# Patient Record
Sex: Female | Born: 1945 | Race: White | Hispanic: No | Marital: Married | Smoking: Never smoker
Health system: Southern US, Community
[De-identification: ages and names within clinical notes are randomized; demographics above are authoritative.]

## PROBLEM LIST (undated history)

## (undated) DIAGNOSIS — K219 Gastro-esophageal reflux disease without esophagitis: Secondary | ICD-10-CM

## (undated) DIAGNOSIS — E785 Hyperlipidemia, unspecified: Secondary | ICD-10-CM

## (undated) DIAGNOSIS — K222 Esophageal obstruction: Secondary | ICD-10-CM

## (undated) DIAGNOSIS — R0600 Dyspnea, unspecified: Secondary | ICD-10-CM

## (undated) DIAGNOSIS — K429 Umbilical hernia without obstruction or gangrene: Secondary | ICD-10-CM

## (undated) DIAGNOSIS — N189 Chronic kidney disease, unspecified: Secondary | ICD-10-CM

## (undated) DIAGNOSIS — E039 Hypothyroidism, unspecified: Secondary | ICD-10-CM

## (undated) DIAGNOSIS — H269 Unspecified cataract: Secondary | ICD-10-CM

## (undated) DIAGNOSIS — I10 Essential (primary) hypertension: Secondary | ICD-10-CM

## (undated) DIAGNOSIS — K5790 Diverticulosis of intestine, part unspecified, without perforation or abscess without bleeding: Secondary | ICD-10-CM

## (undated) DIAGNOSIS — J189 Pneumonia, unspecified organism: Secondary | ICD-10-CM

## (undated) DIAGNOSIS — I7300 Raynaud's syndrome without gangrene: Secondary | ICD-10-CM

## (undated) DIAGNOSIS — I272 Pulmonary hypertension, unspecified: Secondary | ICD-10-CM

## (undated) DIAGNOSIS — I499 Cardiac arrhythmia, unspecified: Secondary | ICD-10-CM

## (undated) DIAGNOSIS — M1990 Unspecified osteoarthritis, unspecified site: Secondary | ICD-10-CM

## (undated) DIAGNOSIS — I4891 Unspecified atrial fibrillation: Secondary | ICD-10-CM

## (undated) DIAGNOSIS — D649 Anemia, unspecified: Secondary | ICD-10-CM

## (undated) DIAGNOSIS — R32 Unspecified urinary incontinence: Secondary | ICD-10-CM

## (undated) DIAGNOSIS — G4730 Sleep apnea, unspecified: Secondary | ICD-10-CM

## (undated) DIAGNOSIS — H8110 Benign paroxysmal vertigo, unspecified ear: Secondary | ICD-10-CM

## (undated) DIAGNOSIS — T4145XA Adverse effect of unspecified anesthetic, initial encounter: Secondary | ICD-10-CM

## (undated) DIAGNOSIS — M8580 Other specified disorders of bone density and structure, unspecified site: Secondary | ICD-10-CM

## (undated) DIAGNOSIS — I509 Heart failure, unspecified: Secondary | ICD-10-CM

## (undated) DIAGNOSIS — I519 Heart disease, unspecified: Secondary | ICD-10-CM

## (undated) HISTORY — DX: Unspecified cataract: H26.9

## (undated) HISTORY — PX: CATARACT EXTRACTION, BILATERAL: SHX1313

## (undated) HISTORY — DX: Essential (primary) hypertension: I10

## (undated) HISTORY — DX: Gastro-esophageal reflux disease without esophagitis: K21.9

## (undated) HISTORY — PX: EYE SURGERY: SHX253

## (undated) HISTORY — DX: Hypothyroidism, unspecified: E03.9

## (undated) HISTORY — DX: Diverticulosis of intestine, part unspecified, without perforation or abscess without bleeding: K57.90

## (undated) HISTORY — DX: Esophageal obstruction: K22.2

## (undated) HISTORY — PX: JOINT REPLACEMENT: SHX530

## (undated) HISTORY — DX: Benign paroxysmal vertigo, unspecified ear: H81.10

## (undated) HISTORY — PX: CHOLECYSTECTOMY: SHX55

## (undated) HISTORY — DX: Unspecified urinary incontinence: R32

## (undated) HISTORY — DX: Hyperlipidemia, unspecified: E78.5

## (undated) HISTORY — DX: Other specified disorders of bone density and structure, unspecified site: M85.80

## (undated) HISTORY — DX: Heart disease, unspecified: I51.9

## (undated) HISTORY — DX: Raynaud's syndrome without gangrene: I73.00

---

## 1978-08-03 HISTORY — PX: TUBAL LIGATION: SHX77

## 2004-08-03 HISTORY — PX: NASAL FRACTURE SURGERY: SHX718

## 2007-01-20 LAB — CONVERTED CEMR LAB
ALT: 16 units/L
AST: 15 units/L
Alkaline Phosphatase: 62 units/L
BUN: 19 mg/dL
CO2: 24 meq/L
Chloride: 100 meq/L
Creatinine, Ser: 1.07 mg/dL
Glucose, Bld: 87 mg/dL
Potassium: 4 meq/L
Sodium: 137 meq/L
Total Bilirubin: 0.4 mg/dL

## 2008-04-10 ENCOUNTER — Ambulatory Visit (HOSPITAL_COMMUNITY): Admission: RE | Admit: 2008-04-10 | Discharge: 2008-04-10 | Payer: Self-pay | Admitting: Internal Medicine

## 2008-05-03 ENCOUNTER — Ambulatory Visit: Payer: Self-pay | Admitting: Internal Medicine

## 2008-05-22 ENCOUNTER — Encounter: Payer: Self-pay | Admitting: Internal Medicine

## 2008-05-22 ENCOUNTER — Ambulatory Visit: Payer: Self-pay | Admitting: Internal Medicine

## 2008-05-25 ENCOUNTER — Encounter: Payer: Self-pay | Admitting: Internal Medicine

## 2009-02-11 LAB — CONVERTED CEMR LAB
ALT: 10 units/L
Cholesterol: 211 mg/dL
HDL: 64 mg/dL
LDL Cholesterol: 110 mg/dL
TSH: 3.11 microintl units/mL
Triglyceride fasting, serum: 185 mg/dL

## 2009-02-13 LAB — CONVERTED CEMR LAB
BUN: 18 mg/dL
CO2: 24 meq/L
Calcium: 9.2 mg/dL
Chloride: 105 meq/L
Creatinine, Ser: 1.2 mg/dL
Glucose, Bld: 90 mg/dL
Potassium: 4.2 meq/L
Sodium: 139 meq/L

## 2009-07-01 ENCOUNTER — Ambulatory Visit: Payer: Self-pay | Admitting: Internal Medicine

## 2009-07-01 DIAGNOSIS — K219 Gastro-esophageal reflux disease without esophagitis: Secondary | ICD-10-CM | POA: Insufficient documentation

## 2009-07-01 DIAGNOSIS — R32 Unspecified urinary incontinence: Secondary | ICD-10-CM | POA: Insufficient documentation

## 2009-07-01 DIAGNOSIS — Z87448 Personal history of other diseases of urinary system: Secondary | ICD-10-CM | POA: Insufficient documentation

## 2009-07-01 DIAGNOSIS — Z8669 Personal history of other diseases of the nervous system and sense organs: Secondary | ICD-10-CM | POA: Insufficient documentation

## 2009-07-01 DIAGNOSIS — IMO0001 Reserved for inherently not codable concepts without codable children: Secondary | ICD-10-CM | POA: Insufficient documentation

## 2009-07-01 DIAGNOSIS — Z9189 Other specified personal risk factors, not elsewhere classified: Secondary | ICD-10-CM | POA: Insufficient documentation

## 2009-07-01 DIAGNOSIS — E785 Hyperlipidemia, unspecified: Secondary | ICD-10-CM | POA: Insufficient documentation

## 2009-07-01 DIAGNOSIS — Z860100 Personal history of colon polyps, unspecified: Secondary | ICD-10-CM | POA: Insufficient documentation

## 2009-07-01 DIAGNOSIS — E669 Obesity, unspecified: Secondary | ICD-10-CM | POA: Insufficient documentation

## 2009-07-01 LAB — CONVERTED CEMR LAB: Pap Smear: NORMAL

## 2009-07-01 LAB — HM PAP SMEAR: HM Pap smear: NORMAL

## 2009-10-03 ENCOUNTER — Encounter: Payer: Self-pay | Admitting: Internal Medicine

## 2010-01-13 ENCOUNTER — Ambulatory Visit: Payer: Self-pay | Admitting: Internal Medicine

## 2010-01-13 DIAGNOSIS — M1611 Unilateral primary osteoarthritis, right hip: Secondary | ICD-10-CM | POA: Insufficient documentation

## 2010-01-20 ENCOUNTER — Encounter: Payer: Self-pay | Admitting: Internal Medicine

## 2010-02-13 LAB — HM MAMMOGRAPHY

## 2010-02-21 ENCOUNTER — Encounter: Payer: Self-pay | Admitting: Internal Medicine

## 2010-03-28 ENCOUNTER — Encounter: Payer: Self-pay | Admitting: Internal Medicine

## 2010-05-13 ENCOUNTER — Encounter: Payer: Self-pay | Admitting: Internal Medicine

## 2010-09-02 NOTE — Consult Note (Signed)
Summary: [facility]  [facility]   Imported By: Phillis Knack 01/24/2010 08:01:49  _____________________________________________________________________  External Attachment:    Type:   Image     Comment:   External Document

## 2010-09-02 NOTE — Assessment & Plan Note (Signed)
Summary: 3-6 mos f/u #/cd   Vital Signs:  Patient profile:   65 year old female Height:      60.5 inches (153.67 cm) Weight:      165.8 pounds (75.36 kg) Temp:     98.0 degrees F (36.67 degrees C) oral Pulse rate:   88 / minute BP sitting:   140 / 88  (left arm) Cuff size:   regular  Vitals Entered By: Tomma Lightning (January 13, 2010 1:39 PM) CC: 6 month follow-up Is Patient Diabetic? No Pain Assessment Patient in pain? no        Primary Care Provider:  Rowe Clack MD  CC:  6 month follow-up.  History of Present Illness: here for 6 mo f/u  1) HTN- intol of multiple meds - lisinopril+diuretic, benicar, diovan, benicar, norvasc, atenolol - cuurently on no meds and feels well - no CP or dizziness off med - home BP log reviewed -  2) dyslipidemia - intol of many meds  - muscle cramps on simvastin, niaspan, welchol taking fish oil w/o problems - reports "good ratio" and does not feel need for meds  3) GERD - no active symptoms at this time -  no hx ulcers - takes OTC meds only as needed   4) CKD - [place] - but reports renal fx improved s no longer following there  5) c/o hip pain on right, esp witrh twisitng or prolonged walking - ?OA or residual myalgia from statin  Clinical Review Panels:  Lipid Management   Cholesterol:  211 (02/11/2009)   LDL (bad choesterol):  110 (02/11/2009)   HDL (good cholesterol):  64 (02/11/2009)   Triglycerides:  185 (02/11/2009)  Complete Metabolic Panel   Glucose:  90 (02/13/2009)   Sodium:  139 (02/13/2009)   Potassium:  4.2 (02/13/2009)   Chloride:  105 (02/13/2009)   CO2:  24 (02/13/2009)   BUN:  18 (02/13/2009)   Creatinine:  1.20 (02/13/2009)   Calcium:  9.2 (02/13/2009)   Total Bili:  0.4 (01/20/2007)   Alk Phos:  62 (01/20/2007)   SGPT (ALT):  10 (02/11/2009)   SGOT (AST):  15 (01/20/2007)   Current Medications (verified): 1)  Vitamin D 2000 Unit Tabs (Cholecalciferol) ....  Take 1 By Mouth Weekly 2)  Centrum Silver  Tabs (Multiple Vitamins-Minerals) .... Take 1 Po Qd 3)  Calcium 600 600 Mg Tabs (Calcium Carbonate) .... Take 1 Po Qd 4)  Estrace 1 Mg Tabs (Estradiol) .... Take 1 By Mouth Weekly 5)  Refresh 1 % Soln (Carboxymethylcellulose Sodium) .Marland Kitchen.. 1 Drop in Both Eyes Once Daily 6)  Fish Oil 1200 Mg Caps (Omega-3 Fatty Acids) .... Take 1 By Mouth Once Daily  Allergies (verified): 1)  ! Pcn  Past History:  Past Medical History: Colonic polyps, hx of Hypertension dyslipidemia  Review of Systems  The patient denies weight loss, chest pain, syncope, and abdominal pain.    Physical Exam  General:  overweight-appearing.  alert, well-developed, well-nourished, and cooperative to examination.    Lungs:  normal respiratory effort, no intercostal retractions or use of accessory muscles; normal breath sounds bilaterally - no crackles and no wheezes.    Heart:  normal rate, regular rhythm, no murmur, and no rub. BLE without edema. (bilaterally "fat ankles") normal DP pulses and normal cap refill in all 4 extremities    Msk:  right hip: full range of motion. Negative pain with logroll. No deep groin pain. nontender  over greater trochanter.   Neurologic:  alert & oriented X3 and cranial nerves II-XII symetrically intact.  strength normal in all extremities, sensation intact to light touch, and gait normal. speech fluent without dysarthria or aphasia; follows commands with good comprehension.    Impression & Recommendations:  Problem # 1:  HYPERTENSION (ICD-401.9)  BP today: 140/88 Prior BP: 130/82 (07/01/2009)  Labs Reviewed: K+: 4.2 (02/13/2009) Creat: : 1.20 (02/13/2009)   Chol: 211 (02/11/2009)   HDL: 64 (02/11/2009)   LDL: 110 (02/11/2009)   TG: 185 (02/11/2009)  monitor off meds at this time - multiple drug intol reviewed - now with normal renal fx - advised DASH diet, exercise and weight loss  Problem # 2:  DYSLIPIDEMIA (ICD-272.4)  labs reviewed  -  med intol reviewed - cont fish oil and diet/exercise/weight control at this time  Labs Reviewed: SGOT: 15 (01/20/2007)   SGPT: 10 (02/11/2009)   HDL:64 (02/11/2009)  LDL:110 (02/11/2009)  Chol:211 (02/11/2009)  Trig:185 (02/11/2009)  Problem # 3:  PAIN IN JOINT PELVIC REGION AND THIGH (ICD-719.45)  exam benign - check xray and refer to Pt cont tylenol as needed  Orders: T-Hip Comp Right Min 2 views (73510TC) Physical Therapy Referral (PT)  Discussed use of medications, application of heat or cold, and exercises.   Complete Medication List: 1)  Vitamin D 2000 Unit Tabs (Cholecalciferol) .... Take 1 by mouth weekly 2)  Centrum Silver Tabs (Multiple vitamins-minerals) .... Take 1 po qd 3)  Calcium 600 600 Mg Tabs (Calcium carbonate) .... Take 1 po qd 4)  Estrace 0.1 Mg/gm Crea (Estradiol) .... Apply weekly 5)  Refresh 1 % Soln (Carboxymethylcellulose sodium) .Marland Kitchen.. 1 drop in both eyes once daily 6)  Fish Oil 1200 Mg Caps (Omega-3 fatty acids) .... Take 1 by mouth once daily  Patient Instructions: 1)  it was good to see you today.  2)  xray right hip ordered today - your results will be posted on the phone tree for review in 48-72 hours from the time of test completion; call [phone] and enter your 9 digit MRN (listed above on this page, just below your name); if any changes need to be made or there are abnormal results, you will be contacted directly.  3)  we'll make referral to physical therapy for your right leg/thigh and hip pain. Our office will contact you regarding this appointment once made.  4)  refill on estrace as discussed - your prescriptions have been electronically submitted to your pharmacy. Please take as directed. Contact our office if you believe you're having problems with the medication(s).  5)  Please schedule a follow-up appointment in 6-12 months, sooner if problems.  Prescriptions: ESTRACE 0.1 MG/GM CREA (ESTRADIOL) apply weekly  #1 x 1   Entered and  Authorized by:   Rowe Clack MD   Signed by:   Rowe Clack MD on 01/13/2010   Method used:   Electronically to        [facility]  [phone]* (retail)       [place], [place]  [address]       Ph: [phone] or [phone]       Fax: [phone]   RxID:   [id number]

## 2010-09-02 NOTE — Miscellaneous (Signed)
Summary: Discharge for PT/[facility]  Discharge for PT/[facility]   Imported By: Phillis Knack 05/15/2010 13:20:12  _____________________________________________________________________  External Attachment:    Type:   Image     Comment:   External Document

## 2010-09-02 NOTE — Miscellaneous (Signed)
Summary: [facility]   Imported By: Bubba Hales 02/25/2010 09:27:40  _____________________________________________________________________  External Attachment:    Type:   Image     Comment:   External Document

## 2010-09-02 NOTE — Miscellaneous (Signed)
Summary: PT/[facility]  PT/[facility]   Imported By: Phillis Knack 04/02/2010 09:36:34  _____________________________________________________________________  External Attachment:    Type:   Image     Comment:   External Document

## 2010-09-02 NOTE — Therapy (Signed)
Summary: [facility] [place] By: Bubba Hales 10/09/2009 11:54:07  _____________________________________________________________________  External Attachment:    Type:   Image     Comment:   External Document

## 2010-09-17 ENCOUNTER — Encounter: Payer: Self-pay | Admitting: Internal Medicine

## 2010-09-17 ENCOUNTER — Ambulatory Visit (INDEPENDENT_AMBULATORY_CARE_PROVIDER_SITE_OTHER): Payer: Medicare Other | Admitting: Internal Medicine

## 2010-09-17 ENCOUNTER — Telehealth: Payer: Self-pay | Admitting: Internal Medicine

## 2010-09-17 DIAGNOSIS — H8110 Benign paroxysmal vertigo, unspecified ear: Secondary | ICD-10-CM

## 2010-09-17 DIAGNOSIS — Z136 Encounter for screening for cardiovascular disorders: Secondary | ICD-10-CM

## 2010-09-24 NOTE — Progress Notes (Signed)
Summary: OV TODAY - high bp, dizzy  Phone Note Call from Patient   Summary of Call: Pt c/o dizzyness when standing. No fever, sinus complaints, h/a vision or other problems. She takes no BP meds. C/o elevated BP today 178/110. MD informed and ok to add to schedule today. Advised to call office w/any changes.  Initial call taken by: Charlsie Quest, [place],  September 17, 2010 11:44 AM  Follow-up for Phone Call        ok - thx Follow-up by: Rowe Clack MD,  September 17, 2010 11:46 AM

## 2010-09-24 NOTE — Assessment & Plan Note (Signed)
Summary: ELEVATED BP/ DIZZY /NWS   Vital Signs:  Patient profile:   65 year old female Height:      60.5 inches (153.67 cm) Weight:      164.0 pounds (74.55 kg) BMI:     31.62 Temp:     97.6 degrees F (36.44 degrees C) oral Pulse rate:   92 / minute BP sitting:   158 / 96  (left arm) Cuff size:   regular  Vitals Entered By: Tomma Lightning RMA (September 17, 2010 3:00 PM) CC: Elevated BP Is Patient Diabetic? No Pain Assessment Patient in pain? no        Primary Care Provider:  Rowe Clack MD  CC:  Elevated BP.  History of Present Illness: here for uncontrolled htn and dizziness started as dizziness 5 days ago - transient symptoms  then return of vertigo this AM - feels off balance with head turn or leaning, esp to left side BP elev today - reviewed home log - but prev noirmal BP at home deneis change in meds -  no HA or tinnitus or ear pain no recent uri or fever or nasal congestion no hx prior vertigo  also reviewed chronic med issues: 1) HTN- intol of multiple meds - lisinopril+diuretic, benicar, diovan, benicar, norvasc, atenolol - cuurently on no meds and feels well - no CP or dizziness off med - home BP log reviewed -  2) dyslipidemia - intol of many meds  - muscle cramps on simvastin, niaspan, welchol taking fish oil w/o problems - reports "good ratio" and does not feel need for meds  3) GERD - no active symptoms at this time -  no hx ulcers - takes OTC meds only as needed   4) CKD - prev followed [place] renal dr. Dim[place] - but reports renal fx improved so no longer following there   Current Medications (verified): 1)  Vitamin D 2000 Unit Tabs (Cholecalciferol) .... Take 1 By Mouth Three Times A Week 2)  Centrum Silver  Tabs (Multiple Vitamins-Minerals) .... Take 1 Po Qd 3)  Calcium 600 600 Mg Tabs (Calcium Carbonate) .... Take 1 Po Qd 4)  Estrace 0.1 Mg/gm Crea (Estradiol) .... Apply Weekly 5)  Refresh 1 % Soln (Carboxymethylcellulose Sodium)  .Marland Kitchen.. 1 Drop in Both Eyes Once Daily 6)  Fish Oil 1200 Mg Caps (Omega-3 Fatty Acids) .... Take 2 By Mouth Once Daily  Allergies (verified): 1)  ! Pcn  Past History:  Past Medical History: Colonic polyps, hx of Hypertension dyslipidemia  MD roster: ENT - [facility]  Review of Systems  The patient denies fever, vision loss, chest pain, peripheral edema, headaches, and abdominal pain.    Physical Exam  General:  overweight-appearing.  alert, well-developed, well-nourished, and cooperative to examination.    Eyes:  vision grossly intact; pupils equal, round and reactive to light.  conjunctiva and lids normal.   no nystagmus Ears:  L TM obscured by cerumen - R TM clear w/o effusion - B hearing aides Lungs:  normal respiratory effort, no intercostal retractions or use of accessory muscles; normal breath sounds bilaterally - no crackles and no wheezes.    Heart:  normal rate, regular rhythm, no murmur, and no rub. BLE without edema. (bilaterally "fat ankles") normal DP pulses and normal cap refill in all 4 extremities    Neurologic:  alert & oriented X3 and cranial nerves II-XII symetrically intact.  strength normal in all extremities, sensation intact to light touch, and gait normal.  speech fluent without dysarthria or aphasia; follows commands with good comprehension.    Impression & Recommendations:  Problem # 1:  BENIGN POSITIONAL VERTIGO (ICD-386.11)  exam benign - reassurance provided -  erx for meclizine done rec to f/u ENT if not improved to clean left ear (pt declined cerumen removal by our office today) Her updated medication list for this problem includes:    Meclizine Hcl 25 Mg Tabs (Meclizine hcl) .Marland Kitchen... 1/2-1 tab by mouth every 4 hours as needed for dizzy symptoms  Demonstrated maneuvers to self-treat vertigo. Patient to call to be seen if no improvement in 10-14 days, sooner if worse.   Orders: Prescription Created Electronically [phone])  Problem # 2:  HYPERTENSION  (ICD-401.9)  Orders: EKG w/ Interpretation (93000) pt attributes inc BP to anxiety about vertigo - home BP log reviewed - usually SBP 110-130 will monitor off meds at this time - multiple drug intol reviewed again-  normal renal fx - advised DASH diet, exercise and weight loss to call if home BP uncontrolled  BP today: 158/96 Prior BP: 140/88 (01/13/2010) Prior BP: 130/82 (07/01/2009)  Labs Reviewed: K+: 4.2 (02/13/2009) Creat: : 1.20 (02/13/2009)   Chol: 211 (02/11/2009)   HDL: 64 (02/11/2009)   LDL: 110 (02/11/2009)   TG: 185 (02/11/2009)  Complete Medication List: 1)  Vitamin D 2000 Unit Tabs (Cholecalciferol) .... Take 1 by mouth three times a week 2)  Centrum Silver Tabs (Multiple vitamins-minerals) .... Take 1 po qd 3)  Calcium 600 600 Mg Tabs (Calcium carbonate) .... Take 1 po qd 4)  Estrace 0.1 Mg/gm Crea (Estradiol) .... Apply weekly 5)  Refresh 1 % Soln (Carboxymethylcellulose sodium) .Marland Kitchen.. 1 drop in both eyes once daily 6)  Fish Oil 1200 Mg Caps (Omega-3 fatty acids) .... Take 2 by mouth once daily 7)  Meclizine Hcl 25 Mg Tabs (Meclizine hcl) .... 1/2-1 tab by mouth every 4 hours as needed for dizzy symptoms  Patient Instructions: 1)  it was good to see you today. 2)  no evidence for stroke or heart attck on evaluation today 3)  your dizziness is due to vertigio - use meclizine to help these symptoms - your prescription has been electronically submitted to your pharmacy. Please take as directed. Contact our office if you believe you're having problems with the medication(s).  4)  If not improved, contact dr. Ernesto Rutherford to clean your left ear 5)  also consider vestibular rehab (physical therapy) if symptoms not improved - call as needed for referral 6)  Check your Blood Pressure regularly. If it is above: 140/85 you should make an appointment. 7)  Please schedule a follow-up appointment in 3-6 months to monitor your bloos pressure, sooner if problems.   Prescriptions: MECLIZINE HCL 25 MG TABS (MECLIZINE HCL) 1/2-1 tab by mouth every 4 hours as needed for dizzy symptoms  #30 x 0   Entered and Authorized by:   Rowe Clack MD   Signed by:   Rowe Clack MD on 09/17/2010   Method used:   Electronically to        [facility]  [phone]* (retail)       [place], [place]  [address]       Ph: [phone] or [phone]       Fax: [phone]   RxID:   [id number]    Orders Added: 1)  EKG w/ Interpretation [93000] 2)  Est. Patient Level IV [id number] 3)  Prescription  Created Electronically [phone]

## 2010-12-12 ENCOUNTER — Encounter: Payer: Self-pay | Admitting: Internal Medicine

## 2010-12-17 ENCOUNTER — Ambulatory Visit (INDEPENDENT_AMBULATORY_CARE_PROVIDER_SITE_OTHER): Payer: Medicare Other | Admitting: Internal Medicine

## 2010-12-17 ENCOUNTER — Encounter: Payer: Self-pay | Admitting: Internal Medicine

## 2010-12-17 DIAGNOSIS — M25559 Pain in unspecified hip: Secondary | ICD-10-CM

## 2010-12-17 DIAGNOSIS — E785 Hyperlipidemia, unspecified: Secondary | ICD-10-CM

## 2010-12-17 DIAGNOSIS — I10 Essential (primary) hypertension: Secondary | ICD-10-CM

## 2010-12-17 MED ORDER — ESTRADIOL 0.1 MG/GM VA CREA: 2.0000 g | TOPICAL_CREAM | VAGINAL | Status: DC

## 2010-12-17 NOTE — Progress Notes (Signed)
  Subjective:    Patient ID: [name], female    DOB: [birth year], 65 y.o.   MRN: [id number]  HPI   here for follow up - reviewed chronic medical issues today:  HTN, ?white coat - intol of multiple meds - lisinopril+diuretic, benicar, diovan, benicar, norvasc, atenolol - currently on no meds and feels well - no CP or dizziness off med - home BP log reviewed - range SBP 110-140s - declines medicine tx  dyslipidemia - intol of many meds  - muscle cramps on simvastin, niaspan, welchol - still feels thighs are weak due to prior tx - improved with PT. taking fish oil w/o problems - reports "good ratio" and does not feel need for other meds  GERD - no active symptoms at this time -  no hx ulcers - takes OTC meds only as needed   CKD - prev [place] renal [place] Dim[place] - but reports renal fx improved so no longer following there  Past Medical History  Diagnosis Date  . Obesity, unspecified   . URINARY INCONTINENCE   . SYNCOPE, HX OF   . BENIGN POSITIONAL VERTIGO   . GERD   . DYSLIPIDEMIA   . HYPERTENSION       Review of Systems  Constitutional: Negative for unexpected weight change.  Respiratory: Negative for shortness of breath.   Cardiovascular: Negative for chest pain, palpitations and leg swelling.  Neurological: Negative for headaches.       Objective:   Physical Exam BP 180/118  Pulse 109  Temp(Src) 98 F (36.7 C) (Oral)  Resp 14  Wt 166 lb (75.297 kg)  SpO2 95% Physical Exam  Constitutional: She is overweight; oriented to person, place, and time. She appears well-developed and well-nourished. No distress. Neck: Normal range of motion. Neck supple. No JVD present. No thyromegaly present.  Cardiovascular: Normal rate, regular rhythm and normal heart sounds.  No murmur heard. No BLE edema. Pulmonary/Chest: Effort normal and breath sounds normal. No respiratory distress. She has no wheezes.  Psychiatric: She has a normal mood and affect. Her  behavior is normal. Judgment and thought content normal.   Lab Results  Component Value Date   CHOL 211 02/11/2009   HDL 64 02/11/2009   ALT 10 02/11/2009   AST 15 01/20/2007   NA 139 02/13/2009   K 4.2 02/13/2009   CL 105 02/13/2009   CREATININE 1.20 02/13/2009   BUN 18 02/13/2009   CO2 24 02/13/2009   TSH 3.110 02/11/2009        Assessment & Plan:  See problem list. Medications and labs reviewed today. Time spent with pt  today 25 minutes, greater than 50% time spent counseling patient on hypertension, cholesterol and medication review. Also review of prior records

## 2010-12-17 NOTE — Patient Instructions (Addendum)
It was good to see you today. Medications reviewed, no changes at this time. Refill on medication(s) as discussed today. Continue to monitor your home blood pressure and call us if this is over 140s/90s Continue to work on your exercises and let us if repeat course of physical therapy is needed Please schedule followup in 12 months, call sooner if problems.

## 2010-12-18 NOTE — Assessment & Plan Note (Signed)
Severe "white coat" per pt - home BP under improved control without meds Prev tx with intol of multiple meds - lisinopril+diuretic, benicar, diovan, benicar, norvasc, atenolol - currently on no meds and feels well - no CP, edema or dizziness off med - home BP log reviewed - Pt declines any rx tx due to these factors - advised to call if home BP readings consistently >140s/90s and pt agrees BP Readings from Last 3 Encounters:  12/17/10 180/118  09/17/10 158/96  01/13/10 140/88

## 2010-12-18 NOTE — Assessment & Plan Note (Signed)
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

## 2010-12-18 NOTE — Assessment & Plan Note (Signed)
pt intol of many meds  - muscle cramps on prior tx simvastin, niaspan, welchol reviewed taking fish oil without problems, remains active - reports "good ratio" and does not feel need for med tx

## 2011-03-02 ENCOUNTER — Ambulatory Visit (INDEPENDENT_AMBULATORY_CARE_PROVIDER_SITE_OTHER): Payer: Medicare Other | Admitting: Internal Medicine

## 2011-03-02 ENCOUNTER — Encounter: Payer: Self-pay | Admitting: Internal Medicine

## 2011-03-02 VITALS — BP 132/98 | HR 82 | Temp 98.6°F | Ht 60.5 in

## 2011-03-02 DIAGNOSIS — J0100 Acute maxillary sinusitis, unspecified: Secondary | ICD-10-CM

## 2011-03-02 MED ORDER — FLUCONAZOLE 150 MG PO TABS: 150.0000 mg | ORAL_TABLET | Freq: Once | ORAL | Status: DC

## 2011-03-02 MED ORDER — CEPHALEXIN 250 MG PO CAPS: 250.0000 mg | ORAL_CAPSULE | Freq: Two times a day (BID) | ORAL | Status: AC

## 2011-03-02 NOTE — Progress Notes (Signed)
  Subjective:     Tiffany [name] is a 65 y.o. female who presents for evaluation of sinus pain. Symptoms include: clear rhinorrhea, cough, facial pain, mouth breathing, sinus pressure and sore throat. Onset of symptoms was 1 week ago. Symptoms have been unchanged since that time. Past history is significant for recurrent sinusitus and tonsillitis. Patient is a non-smoker.  The following portions of the patient's history were reviewed and updated as appropriate: allergies, current medications, past family history, past medical history, past social history, past surgical history and problem list.  Review of Systems Pertinent items are noted in HPI.  No fever, no shortness of breath and no syncope.  Objective:    BP 132/98  Pulse 82  Temp(Src) 98.6 F (37 C) (Oral)  Ht 5' 0.5" (1.537 m) General appearance: alert, cooperative and mild distress Eyes: conjunctivae/corneas clear. PERRL, EOM's intact. Fundi benign. Ears: normal TM's and external ear canals both ears Nose: no discharge, sinus tenderness right, no crusting or bleeding points Throat: abnormal findings: moderate oropharyngeal erythema and no exudate Neck: mild anterior cervical adenopathy, supple, symmetrical, trachea midline and thyroid not enlarged, symmetric, no tenderness/mass/nodules Lungs: clear to auscultation bilaterally Heart: regular rate and rhythm, S1, S2 normal, no murmur, click, rub or gallop    Assessment:    Acute bacterial sinusitis.    Plan:    Nasal saline sprays. Ceftin per medication orders. follow up ENT as needed

## 2011-03-02 NOTE — Patient Instructions (Signed)
It was good to see you today. Ceftin for sinus infection and Diflucan as discussed - Your prescription(s) have been submitted to your pharmacy. Please take as directed and contact our office if you believe you are having problem(s) with the medication(s). Continue Mucinex and nose sprays as reviewed - follow up [facility] as needed

## 2011-03-19 ENCOUNTER — Other Ambulatory Visit: Payer: Self-pay | Admitting: Internal Medicine

## 2011-05-06 ENCOUNTER — Encounter: Payer: Self-pay | Admitting: Internal Medicine

## 2011-05-06 ENCOUNTER — Ambulatory Visit (INDEPENDENT_AMBULATORY_CARE_PROVIDER_SITE_OTHER): Payer: Medicare Other | Admitting: Internal Medicine

## 2011-05-06 DIAGNOSIS — M5431 Sciatica, right side: Secondary | ICD-10-CM

## 2011-05-06 DIAGNOSIS — M5416 Radiculopathy, lumbar region: Secondary | ICD-10-CM

## 2011-05-06 DIAGNOSIS — IMO0002 Reserved for concepts with insufficient information to code with codable children: Secondary | ICD-10-CM

## 2011-05-06 DIAGNOSIS — M25559 Pain in unspecified hip: Secondary | ICD-10-CM

## 2011-05-06 DIAGNOSIS — M5430 Sciatica, unspecified side: Secondary | ICD-10-CM

## 2011-05-06 NOTE — Assessment & Plan Note (Signed)
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

## 2011-05-06 NOTE — Progress Notes (Signed)
  Subjective:    Patient ID: Tiffany [name], female    DOB: 1946/05/01, 65 y.o.   MRN: [id number]  HPI complains of R low back pain, R hip and groin Hx same since 2010, known sciatica - Progressive symptoms - weakness as well as ache symptoms worse sitting and bending forward - improved standing at rest, exac with activity No fever, weight loss or urinary issues  Also reviewed chronic medical issues today:  HTN, ?white coat - intol of multiple meds - lisinopril+diuretic, benicar, diovan, benicar, norvasc, atenolol - currently on no meds and feels well - no CP or dizziness off med - home BP log reviewed - range SBP 110-140s - declines medicine tx  dyslipidemia - intol of many meds  - muscle cramps on simvastin, niaspan, welchol - still feels thighs are weak due to prior tx - improved with PT. taking fish oil w/o problems - reports "good ratio" and does not feel need for other meds  GERD - no active symptoms at this time -  no hx ulcers - takes OTC meds only as needed   CKD - [place] - but reports renal fx improved so no longer following there  Past Medical History  Diagnosis Date  . Obesity, unspecified   . URINARY INCONTINENCE   . SYNCOPE, HX OF   . BENIGN POSITIONAL VERTIGO   . GERD   . DYSLIPIDEMIA   . HYPERTENSION      Review of Systems  Constitutional: Negative for unexpected weight change.  Respiratory: Negative for shortness of breath.   Cardiovascular: Negative for chest pain, palpitations and leg swelling.  Neurological: Negative for headaches.       Objective:   Physical Exam  BP 138/92  Pulse 101  Temp(Src) 99 F (37.2 C) (Oral)  Ht 5' (1.524 m)  Wt 165 lb 1.9 oz (74.898 kg)  BMI 32.25 kg/m2  SpO2 95%   Constitutional: She is overweight; appears well-developed and well-nourished. No distress. Neck: Normal range of motion. Neck supple. No JVD present. No thyromegaly present.  Cardiovascular: Normal rate, regular  rhythm and normal heart sounds.  No murmur heard. No BLE edema. Pulmonary/Chest: Effort normal and breath sounds normal. No respiratory distress. She has no wheezes.  Mskel: Back: full range of motion of thoracic and lumbar spine. Min paraspinal lumbar tenderness on R to palpation. Positive ipsilateral straight leg raise. DTR's are symmetrically intact. Sensation intact in all dermatomes of the lower extremities. Full strength to manual muscle testing - able to heel toe walk without difficulty but ambulates with antalgic gait.  Lab Results  Component Value Date   CHOL 211 02/11/2009   HDL 64 02/11/2009   ALT 10 02/11/2009   AST 15 01/20/2007   NA 139 02/13/2009   K 4.2 02/13/2009   CL 105 02/13/2009   CREATININE 1.20 02/13/2009   BUN 18 02/13/2009   CO2 24 02/13/2009   TSH 3.110 02/11/2009        Assessment & Plan:  See problem list. Medications and labs reviewed today.

## 2011-05-06 NOTE — Patient Instructions (Signed)
It was good to see you today. we'll make referral for MRI. Our office will contact you regarding appointment(s) once made. Your results will be called to you after review (48-72hours after test completion). If any therapy or referral to back specialist are needed, you will be notified at that time.  Sciatica with Rehab   The sciatic nerve runs from the back down the leg and is responsible for sensation and control of the muscles in the back (posterior) side of the thigh, lower leg, and foot. Sciatica is a condition that is characterized by inflammation of this nerve.     SYMPTOMS  Signs of nerve damage, including numbness and/or weakness along the posterior side of the lower extremity.  Pain in the back of the thigh that may also travel down the leg.  Pain that worsens when sitting for long periods of time.  Occasionally, pain in the back or buttock.   CAUSES Inflammation of the sciatic nerve is the cause of sciatica. The inflammation is due to something irritating the nerve. Common sources of irritation include:  Sitting for long periods of time.  Direct trauma to the nerve.  Arthritis of the spine.  Herniated or ruptured disk.  Slipping of the vertebrae (spondylolithesis )  Pressure from soft tissues, such as muscles or ligament-like tissue (fascia).   RISK INCREASES WITH    Sports that place pressure or stress on the spine (football or weightlifting).  Poor strength and flexibility.  Failure to warm-up properly before activity.  Family history of low back pain or disk disorders.  Previous back injury or surgery.  Poor body mechanics, especially when lifting, or poor posture.   PREVENTIVE MEASURES    Warm up and stretch properly before activity.  Maintain physical fitness: l Strength, flexibility, and endurance.  Cardiovascular fitness.  Learn and use proper technique, especially with posture and lifting. When possible, have coach correct improper  technique.  Avoid activities that place stress on the spine.   EXPECTED OUTCOME If treated properly, then sciatica usually resolves within 6 weeks. However, occasionally surgery is necessary.     POSSIBLE COMPLICATIONS    Permanent nerve damage, including pain, numbness, tingle, or weakness.  Chronic back pain.  Risks of surgery: infection, bleeding, nerve damage, or damage to surrounding tissues.   GENERAL TREATMENT CONSIDERATIONS   Treatment initially involves resting from any activities that aggravate your symptoms. The use of ice and medication may help reduce pain and inflammation. The use of strengthening and stretching exercises may help reduce pain with activity. These exercises may be performed at home or with referral to a therapist. A therapist may recommend further treatments, such as transcutaneous electronic nerve stimulation (TENS) or ultrasound.  Your caregiver may recommend corticosteroid injections to help reduce inflammation of the sciatic nerve. If symptoms persist despite non-surgical (conservative) treatment, then surgery may be recommended.   MEDICATION:    If pain medication is necessary, then nonsteroidal anti-inflammatory medications, such as aspirin and ibuprofen, or other minor pain relievers, such as acetaminophen, are often recommended.    Do not take pain medication for 7 days before surgery.    Prescription pain relievers may be given if deemed necessary by your caregiver. Use only as directed and only as much as you need.  Ointments applied to the skin may be helpful.  Corticosteroid injections may be given by your caregiver. These injections should be reserved for the most serious cases, because they may only be given a certain number of times.  HEAT AND COLD:    Cold treatment (icing) relieves pain and reduces inflammation. Cold treatment should be applied for 10 to 15 minutes every 2 to 3 hours for inflammation and pain and immediately after any  activity that aggravates your symptoms. Use ice packs or massage the area with a piece of ice (ice massage).  Heat treatment may be used prior to performing the stretching and strengthening activities prescribed by your caregiver, physical therapist, or athletic trainer. Use a heat pack or soak the injury in warm water.   SEEK MEDICAL TREATMENT IF:  Treatment seems to offer no benefit, or the condition worsens.  Any medications produce adverse side effects.   EXERCISES    RANGE OF MOTION AND STRETCHING EXERCISES - Sciatica Most people with sciatic will find that their symptoms worsen with either excessive bending forward (flexion) or arching at the low back (extension). The exercises which will help resolve your symptoms will focus on the opposite motion. Your physician, physical therapist or athletic trainer will help you determine which exercises will be most helpful to resolve your low back pain. Do not complete any exercises without first consulting with your clinician. Discontinue any exercises which worsen your symptoms until you speak to your clinician. If you have pain, numbness or tingling which travels down into your buttocks, leg or foot, the goal of the therapy is for these symptoms to move closer to your back and eventually resolve. Occasionally, these leg symptoms will get better, but your low back pain may worsen; this is typically an indication of progress in your rehabilitation. Be certain to be very alert to any changes in your symptoms and the activities in which you participated in the 24 hours prior to the change. Sharing this information with your clinician will allow him/her to most efficiently treat your condition.   These exercises may help you when beginning to rehabilitate your injury. Your symptoms may resolve with or without further involvement from your physician, physical therapist or athletic trainer. While completing these exercises, remember:    Restoring tissue  flexibility helps normal motion to return to the joints. This allows healthier, less painful movement and activity.  An effective stretch should be held for at least 30 seconds.  A stretch should never be painful. You should only feel a gentle lengthening or release in the stretched tissue.       FLEXION RANGE OF MOTION AND STRETCHING EXERCISES:   STRETCH - Flexion, Single Knee to Chest   Lie on a firm bed or floor with both legs extended in front of you.  Keeping one leg in contact with the floor, bring your opposite knee to your chest. Hold your leg in place by either grabbing behind your thigh or at your knee.  Pull until you feel a gentle stretch in your low back. Hold __________ seconds.  Slowly release your grasp and repeat the exercise with the opposite side. Repeat __________ times. Complete this exercise __________ times per day.       STRETCH - Flexion, Double Knee to Chest    Lie on a firm bed or floor with both legs extended in front of you.  Keeping one leg in contact with the floor, bring your opposite knee to your chest.    Tense your stomach muscles to support your back and then lift your other knee to your chest. Hold your legs in place by either grabbing behind your thighs or at your knees.  Pull both knees toward your chest  until you feel a gentle stretch in your low back. Hold __________ seconds.  Tense your stomach muscles and slowly return one leg at a time to the floor. Repeat __________ times. Complete this exercise __________ times per day.       STRETCH - Low Trunk Rotation   Lie on a firm bed or floor. Keeping your legs in front of you, bend your knees so they are both pointed toward the ceiling and your feet are flat on the floor.  Extend your arms out to the side. This will stabilize your upper body by keeping your shoulders in contact with the floor.  Gently and slowly drop both knees together to one side until you feel a gentle stretch in your  low back. Hold for __________ seconds.    Tense your stomach muscles to support your low back as you bring your knees back to the starting position.  Repeat the exercise to the other side. Repeat __________ times. Complete this exercise __________ times per day       EXTENSION RANGE OF MOTION AND FLEXIBILITY EXERCISES:   STRETCH - Extension, Prone on Elbows    Lie on your stomach on the floor, a bed will be too soft. Place your palms about shoulder width apart and at the height of your head.  Place your elbows under your shoulders. If this is too painful, stack pillows under your chest.  Allow your body to relax so that your hips drop lower and make contact more completely with the floor.  Hold this position for __________ seconds.  Slowly return to lying flat on the floor. Repeat __________ times. Complete this exercise __________ times per day.       RANGE OF MOTION - Extension, Prone Press Ups    Lie on your stomach on the floor, a bed will be too soft. Place your palms about shoulder width apart and at the height of your head.  Keeping your back as relaxed as possible, slowly straighten your elbows while keeping your hips on the floor. You may adjust the placement of your hands to maximize your comfort. As you gain motion, your hands will come more underneath your shoulders.  Hold this position __________ seconds.  Slowly return to lying flat on the floor. Repeat __________ times. Complete this exercise __________ times per day.       STRENGTHENING EXERCISES - Sciatica  These exercises may help you when beginning to rehabilitate your injury. These exercises should be done near your "sweet spot." This is the neutral, low-back arch, somewhere between fully rounded and fully arched, that is your least painful position. When performed in this safe range of motion, these exercises can be used for people who have either a flexion or extension based injury. These exercises may resolve  your symptoms with or without further involvement from your physician, physical therapist or athletic trainer. While completing these exercises, remember:    Muscles can gain both the endurance and the strength needed for everyday activities through controlled exercises.  Complete these exercises as instructed by your physician, physical therapist or athletic trainer. Progress with the resistance and repetition exercises only as your caregiver advises.  You may experience muscle soreness or fatigue, but the pain or discomfort you are trying to eliminate should never worsen during these exercises.  If this pain does worsen, stop and make certain you are following the directions exactly. If the pain is still present after adjustments, discontinue the exercise until you can discuss the  trouble with your clinician.    STRENGTHENING - Deep Abdominals, Pelvic Tilt   Lie on a firm bed or floor. Keeping your legs in front of you, bend your knees so they are both pointed toward the ceiling and your feet are flat on the floor.  Tense your lower abdominal muscles to press your low back into the floor. This motion will rotate your pelvis so that your tail bone is scooping upwards rather than pointing at your feet or into the floor.  With a gentle tension and even breathing, hold this position for __________ seconds. Repeat __________ times. Complete this exercise __________ times per day.       STRENGTHENING - Abdominals, Crunches   Lie on a firm bed or floor. Keeping your legs in front of you, bend your knees so they are both pointed toward the ceiling and your feet are flat on the floor. Cross your arms over your chest.    Slightly tip your chin down without bending your neck.  Tense your abdominals and slowly lift your trunk high enough to just clear your shoulder blades. Lifting higher can put excessive stress on the low back and does not further strengthen your abdominal muscles.  Control your  return to the starting position. Repeat __________ times. Complete this exercise __________ times per day.       STRENGTHENING - Quadruped, Opposite UE/LE Lift    Assume a hands and knees position on a firm surface. Keep your hands under your shoulders and your knees under your hips. You may place padding under your knees for comfort.    Find your neutral spine and gently tense your abdominal muscles so that you can maintain this position. Your shoulders and hips should form a rectangle that is parallel with the floor and is not twisted.    Keeping your trunk steady, lift your right hand no higher than your shoulder and then your left leg no higher than your hip. Make sure you are not holding your breath. Hold this position __________ seconds.  Continuing to keep your abdominal muscles tense and your back steady, slowly return to your starting position. Repeat with the opposite arm and leg. Repeat __________ times. Complete this exercise __________ times per day.       STRENGTHENING - Abdominals and Quadriceps, Straight Leg Raise   Lie on a firm bed or floor with both legs extended in front of you.  Keeping one leg in contact with the floor, bend the other knee so that your foot can rest flat on the floor.  Find your neutral spine, and tense your abdominal muscles to maintain your spinal position throughout the exercise.  Slowly lift your straight leg off the floor about 6 inches for a count of 15, making sure to not hold your breath.  Still keeping your neutral spine, slowly lower your leg all the way to the floor.   Repeat this exercise with each leg __________ times. Complete this exercise __________ times per day.     POSTURE AND BODY MECHANICS CONSIDERATIONS - Sciatica Keeping correct posture when sitting, standing or completing your activities will reduce the stress put on different body tissues, allowing injured tissues a chance to heal and limiting painful experiences. The  following are general guidelines for improved posture. Your physician or physical therapist will provide you with any instructions specific to your needs. While reading these guidelines, remember:  The exercises prescribed by your provider will help you have the flexibility and strength to  maintain correct postures.  The correct posture provides the optimal environment for your joints to work. All of your joints have less wear and tear when properly supported by a spine with good posture. This means you will experience a healthier, less painful body.  Correct posture must be practiced with all of your activities, especially prolonged sitting and standing.  Correct posture is as important when doing repetitive low-stress activities (typing) as it is when doing a single heavy-load activity (lifting).       RESTING POSITIONS Consider which positions are most painful for you when choosing a resting position. If you have pain with flexion-based activities (sitting, bending, stooping, squatting), choose a position that allows you to rest in a less flexed posture. You would want to avoid curling into a fetal position on your side. If your pain worsens with extension-based activities (prolonged standing, working overhead), avoid resting in an extended position such as sleeping on your stomach. Most people will find more comfort when they rest with their spine in a more neutral position, neither too rounded nor too arched. Lying on a non-sagging bed on your side with a pillow between your knees, or on your back with a pillow under your knees will often provide some relief. Keep in mind, being in any one position for a prolonged period of time, no matter how correct your posture, can still lead to stiffness.  PROPER SITTING POSTURE In order to minimize stress and discomfort on your spine, you must sit with correct posture Sitting with good posture should be effortless for a healthy body.  Returning to good  posture is a gradual process. Many people can work toward this most comfortably by using various supports until they have the flexibility and strength to maintain this posture on their own.   When sitting with proper posture, your ears will fall over your shoulders and your shoulders will fall over your hips. You should use the back of the chair to support your upper back. Your low back will be in a neutral position, just slightly arched. You may place a small pillow or folded towel at the base of your low back for support.     When working at a desk, create an environment that supports good, upright posture. Without extra support, muscles fatigue and lead to excessive strain on joints and other tissues.  Keep these recommendations in mind:  CHAIR:    A chair should be able to slide under your desk when your back makes contact with the back of the chair. This allows you to work closely.  The chair's height should allow your eyes to be level with the upper part of your monitor and your hands to be slightly lower than your elbows.  BODY POSITION  Your feet should make contact with the floor. If this is not possible, use a foot rest.  Keep your ears over your shoulders. This will reduce stress on your neck and low back.     INCORRECT SITTING POSTURES   If you are feeling tired and unable to assume a healthy sitting posture, do not slouch or slump. This puts excessive strain on your back tissues, causing more damage and pain. Healthier options include:  Using more support, like a lumbar pillow.  Switching tasks to something that requires you to be upright or walking.  Talking a brief walk.  Lying down to rest in a neutral-spine position.  PROLONGED STANDING WHILE SLIGHTLY LEANING FORWARD  When completing a task that requires  you to lean forward while standing in one place for a long time, place either foot up on a stationary 2-4 inch high object to help maintain the best posture. When  both feet are on the ground, the low back tends to lose its slight inward curve.  If this curve flattens (or becomes too large), then the back and your other joints will experience too much stress, fatigue more quickly and can cause pain.    CORRECT STANDING POSTURES Proper standing posture should be assumed with all daily activities, even if they only take a few moments, like when brushing your teeth. As in sitting, your ears should fall over your shoulders and your shoulders should fall over your hips. You should keep a slight tension in your abdominal muscles to brace your spine. Your tailbone should point down to the ground, not behind your body, resulting in an over-extended swayback posture.     INCORRECT STANDING POSTURES  Common incorrect standing postures include a forward head, locked knees and/or an excessive swayback.  WALKING Walk with an upright posture. Your ears, shoulders and hips should all line-up.  PROLONGED ACTIVITY IN A FLEXED POSITION When completing a task that requires you to bend forward at your waist or lean over a low surface, try to find a way to stabilize 3 of 4 of your limbs. You can place a hand or elbow on your thigh or rest a knee on the surface you are reaching across. This will provide you more stability so that your muscles do not fatigue as quickly. By keeping your knees relaxed, or slightly bent, you will also reduce stress across your low back.    CORRECT LIFTING TECHNIQUES DO :   Assume a wide stance. This will provide you more stability and the opportunity to get as close as possible to the object which you are lifting.  Tense your abdominals to brace your spine; then bend at the knees and hips. Keeping your back locked in a neutral-spine position, lift using your leg muscles. Lift with your legs, keeping your back straight.  Test the weight of unknown objects before attempting to lift them.  Try to keep your elbows locked down at your sides in order  get the best strength from your shoulders when carrying an object.  Always ask for help when lifting heavy or awkward objects.  INCORRECT LIFTING TECHNIQUES DO NOT:   Lock your knees when lifting, even if it is a small object.  Bend and twist. Pivot at your feet or move your feet when needing to change directions.  Assume that you cannot safely pick up a paperclip without proper posture.    Document Released: 07/20/2005  Document Re-Released: 08/11/2009 [facility] Patient Information 2011 [place].

## 2011-05-08 ENCOUNTER — Other Ambulatory Visit: Payer: Self-pay | Admitting: Internal Medicine

## 2011-05-08 ENCOUNTER — Ambulatory Visit
Admission: RE | Admit: 2011-05-08 | Discharge: 2011-05-08 | Disposition: A | Discharge status: Home / Self Care | Payer: Medicare Other | Source: Ambulatory Visit | Attending: Internal Medicine | Admitting: Internal Medicine

## 2011-05-08 DIAGNOSIS — M5431 Sciatica, right side: Secondary | ICD-10-CM

## 2011-05-08 DIAGNOSIS — M25559 Pain in unspecified hip: Secondary | ICD-10-CM

## 2011-05-08 DIAGNOSIS — M5416 Radiculopathy, lumbar region: Secondary | ICD-10-CM

## 2011-09-02 DIAGNOSIS — Z1231 Encounter for screening mammogram for malignant neoplasm of breast: Secondary | ICD-10-CM | POA: Diagnosis not present

## 2011-09-02 LAB — HM MAMMOGRAPHY

## 2011-09-03 ENCOUNTER — Encounter: Payer: Self-pay | Admitting: Internal Medicine

## 2011-09-04 ENCOUNTER — Encounter: Payer: Self-pay | Admitting: Internal Medicine

## 2011-09-09 DIAGNOSIS — R928 Other abnormal and inconclusive findings on diagnostic imaging of breast: Secondary | ICD-10-CM | POA: Diagnosis not present

## 2011-09-09 LAB — HM MAMMOGRAPHY

## 2011-09-10 ENCOUNTER — Encounter: Payer: Self-pay | Admitting: Internal Medicine

## 2011-09-10 DIAGNOSIS — H2510 Age-related nuclear cataract, unspecified eye: Secondary | ICD-10-CM | POA: Diagnosis not present

## 2011-09-10 DIAGNOSIS — H5210 Myopia, unspecified eye: Secondary | ICD-10-CM | POA: Diagnosis not present

## 2011-09-10 DIAGNOSIS — H52229 Regular astigmatism, unspecified eye: Secondary | ICD-10-CM | POA: Diagnosis not present

## 2011-09-10 DIAGNOSIS — H40059 Ocular hypertension, unspecified eye: Secondary | ICD-10-CM | POA: Diagnosis not present

## 2011-09-21 DIAGNOSIS — M25559 Pain in unspecified hip: Secondary | ICD-10-CM | POA: Diagnosis not present

## 2011-09-25 ENCOUNTER — Encounter: Payer: Self-pay | Admitting: Internal Medicine

## 2011-09-25 ENCOUNTER — Ambulatory Visit (INDEPENDENT_AMBULATORY_CARE_PROVIDER_SITE_OTHER): Payer: Medicare Other | Admitting: Internal Medicine

## 2011-09-25 VITALS — BP 142/100 | HR 96 | Temp 88.0°F

## 2011-09-25 DIAGNOSIS — I10 Essential (primary) hypertension: Secondary | ICD-10-CM | POA: Diagnosis not present

## 2011-09-25 DIAGNOSIS — M25559 Pain in unspecified hip: Secondary | ICD-10-CM

## 2011-09-25 DIAGNOSIS — J209 Acute bronchitis, unspecified: Secondary | ICD-10-CM

## 2011-09-25 MED ORDER — LEVOFLOXACIN 500 MG PO TABS: 500.0000 mg | ORAL_TABLET | Freq: Every day | ORAL | Status: AC

## 2011-09-25 NOTE — Assessment & Plan Note (Signed)
Severe "white coat" per pt - home BP under improved control without meds Prev tx with intol of multiple meds - lisinopril+diuretic, benicar, diovan, benicar, norvasc, atenolol - currently on no meds and feels well - no CP, edema or dizziness off med - home BP log reviewed - Pt declines any rx tx due to these factors - advised to call if home BP readings consistently >140s/90s and pt agrees BP Readings from Last 3 Encounters:  09/25/11 142/100  05/06/11 138/92  03/02/11 132/98

## 2011-09-25 NOTE — Progress Notes (Signed)
  Subjective:    HPI  complains of cough symptoms  Onset >4 week ago, wax/wane symptoms  Initially associated with rhinorrhea, sneezing, sore throat, mild headache and low grade fever Ongoing mild chest congestion with cough No relief with OTC meds Precipitated by sick contacts  Past Medical History  Diagnosis Date  . Obesity, unspecified   . URINARY INCONTINENCE   . SYNCOPE, HX OF   . BENIGN POSITIONAL VERTIGO   . GERD   . DYSLIPIDEMIA   . HYPERTENSION     Review of Systems Constitutional: No fever or night sweats, no unexpected weight change Pulmonary: No pleurisy or hemoptysis Cardiovascular: No chest pain or palpitations     Objective:   Physical Exam BP 142/100  Pulse 96  Temp(Src) 88 F (31.1 C) (Oral)  SpO2 96% GEN: mildly ill appearing and audible chest congestion HENT: NCAT, no sinus tenderness bilaterally, nares with clear discharge, oropharynx mild erythema, no exudate Eyes: Vision grossly intact, no conjunctivitis Lungs: RLL crackles, no wheeze, no increased work of breathing Cardiovascular: Regular rate and rhythm, no bilateral edema      Assessment & Plan:  Viral URI > 4 weeks ago, now RLL crackles -  Bronchitis vs early bronchopneumonia  - afeb and normal O2 Cough, related to above   Empiric antibiotics prescribed due to symptom duration greater than 7 days and exam abnormality in RLL Prescription cough suppression, pt declines need for same, will use OTC -  Symptomatic care with Tylenol or Advil, hydration and rest -  salt gargle advised as needed

## 2011-09-25 NOTE — Patient Instructions (Addendum)
It was good to see you today. Levaquin antibiotics daily for 1 week - Your prescription(s) have been submitted to your pharmacy. Please take as directed and contact our office if you believe you are having problem(s) with the medication(s). Continue Mucinex and call if symptoms worse or unimprovedAcute Bronchitis Bronchitis is when the organs and tissues involved in breathing get puffy (swollen) and can leak fluid. This makes it harder for air to get in and out of the lungs. You may cough a lot and produce thick spit (mucus). Acute means the illness started suddenly. HOME CARE  Rest.     Drink enough fluids to keep the pee (urine) clear or pale yellow.     Medicines may be given that will open up your airways to help you breathe better. Only take medicine as told by your doctor.     Use a cool mist vaporizer. This will help to thin any thick spit.     Do not smoke. Avoid secondhand smoke.  GET HELP RIGHT AWAY IF:    You have a temperature by mouth above 102 F (38.9 C), not controlled by medicine.     You have chills.     You develop severe shortness of breath or chest pain.     You have bloody spit mixed with mucus (sputum).     You throw up (vomit) often.     You lose too much body fluid (dehydrated).     You have a severe headache.     You feel faint.     You do not improve after 1 week of treatment.  MAKE SURE YOU:    Understand these instructions.     Will watch your condition.     Will get help right away if you are not doing well or get worse.  Document Released: 01/06/2008 Document Revised: 04/01/2011 Document Reviewed: 08/07/2009 [facility] Patient Information 2012 [place].

## 2011-09-25 NOTE — Assessment & Plan Note (Signed)
Hx same since 2011 - intermittent but gradually worsening Mild R hip OA 01/2010 xray - suspect RLE pain radicular/sciatica > DJD Pain improved with PT course 01/2010 - then recurrent MRI L spine ordered 05/2011 - ongoing eval by spine and scoli specialist s/p R hip steroid injection, on NSAIDs and planning 2nd injection soon - but overall symptoms improved

## 2011-10-01 ENCOUNTER — Ambulatory Visit (INDEPENDENT_AMBULATORY_CARE_PROVIDER_SITE_OTHER)
Admission: RE | Admit: 2011-10-01 | Discharge: 2011-10-01 | Disposition: A | Discharge status: Home / Self Care | Payer: Medicare Other | Source: Ambulatory Visit | Attending: Internal Medicine | Admitting: Internal Medicine

## 2011-10-01 ENCOUNTER — Other Ambulatory Visit (INDEPENDENT_AMBULATORY_CARE_PROVIDER_SITE_OTHER): Payer: Medicare Other

## 2011-10-01 ENCOUNTER — Encounter: Payer: Self-pay | Admitting: Internal Medicine

## 2011-10-01 ENCOUNTER — Ambulatory Visit (INDEPENDENT_AMBULATORY_CARE_PROVIDER_SITE_OTHER): Payer: Medicare Other | Admitting: Internal Medicine

## 2011-10-01 VITALS — BP 126/92 | HR 88 | Temp 97.1°F

## 2011-10-01 DIAGNOSIS — R059 Cough, unspecified: Secondary | ICD-10-CM

## 2011-10-01 DIAGNOSIS — R5383 Other fatigue: Secondary | ICD-10-CM

## 2011-10-01 DIAGNOSIS — R002 Palpitations: Secondary | ICD-10-CM

## 2011-10-01 DIAGNOSIS — R05 Cough: Secondary | ICD-10-CM

## 2011-10-01 DIAGNOSIS — R5381 Other malaise: Secondary | ICD-10-CM | POA: Diagnosis not present

## 2011-10-01 LAB — BASIC METABOLIC PANEL
BUN: 21 mg/dL (ref 6–23)
CO2: 27 mEq/L (ref 19–32)
Calcium: 9.3 mg/dL (ref 8.4–10.5)
Chloride: 105 mEq/L (ref 96–112)
Creatinine, Ser: 1.4 mg/dL — ABNORMAL HIGH (ref 0.4–1.2)
GFR: 40.15 mL/min — ABNORMAL LOW (ref >60.00)
Glucose, Bld: 86 mg/dL (ref 70–99)
Potassium: 3.9 mEq/L (ref 3.5–5.1)
Sodium: 139 mEq/L (ref 135–145)

## 2011-10-01 LAB — CBC WITH DIFFERENTIAL/PLATELET
Basophils Absolute: 0.1 10*3/uL (ref 0.0–0.1)
Basophils Relative: 1.4 % (ref 0.0–3.0)
Eosinophils Absolute: 0.2 10*3/uL (ref 0.0–0.7)
Eosinophils Relative: 2.1 % (ref 0.0–5.0)
HCT: 45 % (ref 36.0–46.0)
Hemoglobin: 15.2 g/dL — ABNORMAL HIGH (ref 12.0–15.0)
Lymphocytes Relative: 28.4 % (ref 12.0–46.0)
Lymphs Abs: 2.1 10*3/uL (ref 0.7–4.0)
MCHC: 33.8 g/dL (ref 30.0–36.0)
MCV: 88.8 fl (ref 78.0–100.0)
Monocytes Absolute: 0.6 10*3/uL (ref 0.1–1.0)
Monocytes Relative: 8.5 % (ref 3.0–12.0)
Neutro Abs: 4.5 10*3/uL (ref 1.4–7.7)
Neutrophils Relative %: 59.6 % (ref 43.0–77.0)
Platelets: 288 10*3/uL (ref 150.0–400.0)
RBC: 5.07 Mil/uL (ref 3.87–5.11)
RDW: 13.7 % (ref 11.5–14.6)
WBC: 7.5 10*3/uL (ref 4.5–10.5)

## 2011-10-01 LAB — HEPATIC FUNCTION PANEL
ALT: 15 U/L (ref 0–35)
AST: 18 U/L (ref 0–37)
Albumin: 3.5 g/dL (ref 3.5–5.2)
Alkaline Phosphatase: 55 U/L (ref 39–117)
Bilirubin, Direct: 0.1 mg/dL (ref 0.0–0.3)
Total Bilirubin: 0.4 mg/dL (ref 0.3–1.2)
Total Protein: 7.2 g/dL (ref 6.0–8.3)

## 2011-10-01 LAB — TSH: TSH: 2.06 u[IU]/mL (ref 0.35–5.50)

## 2011-10-01 NOTE — Patient Instructions (Signed)
It was good to see you today. EKG today: Test(s) ordered today. Your results will be called to you after review (48-72hours after test completion). If any changes need to be made, you will be notified at that time. we'll make referral to cardiology for evaluation of palpitations symptoms . Our office will contact you regarding appointment(s) once made.

## 2011-10-01 NOTE — Progress Notes (Signed)
  Subjective:   HPI  complains of continued cough symptoms  Onset >5 week ago - seen last week here for same> dx bronchitis unimproved with Levaquin rx last week and OTC meds (Mucinex)  Also complains of episodic palpitations  Events associated with "eye spots and blurring" and "flushed" sensation -  symptoms worse in past 5 days, ongoing spells for years -  severe episode this AM not associated with cough prompting today's OV -  No chest pain or shortness of breath, no syncope No decongestants or other stimulants (caffeine, chocolate)   Past Medical History  Diagnosis Date  . Obesity, unspecified   . URINARY INCONTINENCE   . SYNCOPE, HX OF   . BENIGN POSITIONAL VERTIGO   . GERD   . DYSLIPIDEMIA   . HYPERTENSION     Review of Systems Constitutional: No night sweats, no unexpected weight change; extreme fatigue x 2 weeks Pulmonary: No pleurisy or hemoptysis Cardiovascular: No chest pain; see HPI above    Objective:   Physical Exam BP 126/92  Pulse 88  Temp(Src) 97.1 F (36.2 C) (Oral)  SpO2 97% GEN: nontoxic appearing, no audible chest/head congestion - spouse Terry at side HENT: NCAT, no sinus tenderness bilaterally, nares without discharge, oropharynx without erythema, no exudate Eyes: Vision grossly intact, no conjunctivitis Lungs: CTA B, no wheeze, no increased work of breathing Cardiovascular: Regular rate and rhythm, no bilateral edema - raynaud's purple changes bilateral toes, min in fingers  Lab Results  Component Value Date   GLUCOSE 90 02/13/2009   CHOL 211 02/11/2009   HDL 64 02/11/2009   LDLCALC 110 02/11/2009   ALT 10 02/11/2009   AST 15 01/20/2007   NA 139 02/13/2009   K 4.2 02/13/2009   CL 105 02/13/2009   CREATININE 1.20 02/13/2009   BUN 18 02/13/2009   CO2 24 02/13/2009   TSH 3.110 02/11/2009      Assessment & Plan:  cough > 4 weeks - Dx last week with bronchitis vs early bronchopneumonia - afeb and normal O2, but tx with empiric Levaquin,  ?unimproved  Also palpitations, extreme fatigue and hot flashes, ongoing for years but worse in past 4 weeks since ill - ?stress with upcoming wedding travels to [place] in 2 weeks or other?    EKG today: PR .114 (prev 09/2010 PR .124) - no acute ischemic change or arrythmia  Check labs and CXR Refer to cards for eval of palpitation symptoms

## 2011-10-09 ENCOUNTER — Ambulatory Visit: Payer: Medicare Other | Admitting: Cardiology

## 2011-10-26 DIAGNOSIS — M25559 Pain in unspecified hip: Secondary | ICD-10-CM | POA: Diagnosis not present

## 2011-10-26 DIAGNOSIS — M549 Dorsalgia, unspecified: Secondary | ICD-10-CM | POA: Diagnosis not present

## 2011-10-27 ENCOUNTER — Encounter: Payer: Self-pay | Admitting: Cardiology

## 2011-10-27 ENCOUNTER — Encounter (INDEPENDENT_AMBULATORY_CARE_PROVIDER_SITE_OTHER): Payer: Medicare Other

## 2011-10-27 ENCOUNTER — Ambulatory Visit (INDEPENDENT_AMBULATORY_CARE_PROVIDER_SITE_OTHER): Payer: Medicare Other | Admitting: Cardiology

## 2011-10-27 VITALS — BP 200/100 | HR 64 | Ht 60.0 in | Wt 163.0 lb

## 2011-10-27 DIAGNOSIS — R002 Palpitations: Secondary | ICD-10-CM | POA: Diagnosis not present

## 2011-10-27 DIAGNOSIS — I10 Essential (primary) hypertension: Secondary | ICD-10-CM | POA: Diagnosis not present

## 2011-10-27 NOTE — Assessment & Plan Note (Signed)
The patient has been very sensitive to treatment and reports over aggressive management in the past. I will place a 24-hour blood pressure monitor first so we can judge how hypertensive she is wearing a course of her day. We had a long discussion about the fact that she will need likely blood pressure management but acknowledged or sensitivities to medications. We did discuss the effect of anxiety on her blood pressure. She brought this up and I suggested he discuss this further with Dr. Asa Lente.

## 2011-10-27 NOTE — Patient Instructions (Signed)
Your physician has recommended that you wear an event monitor for 21 days. Event monitors are medical devices that record the heart's electrical activity. Doctors most often [place] these monitors to diagnose arrhythmias. Arrhythmias are problems with the speed or rhythm of the heartbeat. The monitor is a small, portable device. You can wear one while you do your normal daily activities. This is usually used to diagnose what is causing palpitations/syncope (passing out).  You will be scheduled for a 24 hour blood pressure monitor as well.  The current medical regimen is effective;  continue present plan and medications.  Follow up with Dr Percival Spanish after testing.  Hypertension As your heart beats, it forces blood through your arteries. This force is your blood pressure. If the pressure is too high, it is called hypertension (HTN) or high blood pressure. HTN is dangerous because you may have it and not know it. High blood pressure may mean that your heart has to work harder to pump blood. Your arteries may be narrow or stiff. The extra work puts you at risk for heart disease, stroke, and other problems.  Blood pressure consists of two numbers, a higher number over a lower, 110/72, for example. It is stated as "110 over 72." The ideal is below 120 for the top number (systolic) and under 80 for the bottom (diastolic). Write down your blood pressure today. You should pay close attention to your blood pressure if you have certain conditions such as:  Heart failure.   Prior heart attack.   Diabetes   Chronic kidney disease.   Prior stroke.   Multiple risk factors for heart disease.  To see if you have HTN, your blood pressure should be measured while you are seated with your arm held at the level of the heart. It should be measured at least twice. A one-time elevated blood pressure reading (especially in the Emergency Department) does not mean that you need treatment. There may be conditions in which the  blood pressure is different between your right and left arms. It is important to see your caregiver soon for a recheck. Most people have essential hypertension which means that there is not a specific cause. This type of high blood pressure may be lowered by changing lifestyle factors such as:  Stress.   Smoking.   Lack of exercise.   Excessive weight.   Drug/tobacco/alcohol use.   Eating less salt.  Most people do not have symptoms from high blood pressure until it has caused damage to the body. Effective treatment can often prevent, delay or reduce that damage. TREATMENT  When a cause has been identified, treatment for high blood pressure is directed at the cause. There are a large number of medications to treat HTN. These fall into several categories, and your caregiver will help you select the medicines that are best for you. Medications may have side effects. You should review side effects with your caregiver. If your blood pressure stays high after you have made lifestyle changes or started on medicines,   Your medication(s) may need to be changed.   Other problems may need to be addressed.   Be certain you understand your prescriptions, and know how and when to take your medicine.   Be sure to follow up with your caregiver within the time frame advised (usually within two weeks) to have your blood pressure rechecked and to review your medications.   If you are taking more than one medicine to lower your blood pressure, make sure  you know how and at what times they should be taken. Taking two medicines at the same time can result in blood pressure that is too low.  SEEK IMMEDIATE MEDICAL CARE IF:  You develop a severe headache, blurred or changing vision, or confusion.   You have unusual weakness or numbness, or a faint feeling.   You have severe chest or abdominal pain, vomiting, or breathing problems.  MAKE SURE YOU:   Understand these instructions.   Will watch your  condition.   Will get help right away if you are not doing well or get worse.  Document Released: 07/20/2005 Document Revised: 07/09/2011 Document Reviewed: 03/09/2008 [facility] Patient Information 2012 [place].  Palpitations  A palpitation is the feeling that your heartbeat is irregular or is faster than normal. Although this is frightening, it usually is not serious. Palpitations may be caused by excesses of smoking, caffeine, or alcohol. They are also brought on by stress and anxiety. Sometimes, they are caused by heart disease. Unless otherwise noted, your caregiver did not find any signs of serious illness at this time. HOME CARE INSTRUCTIONS  To help prevent palpitations:  Drink decaffeinated coffee, tea, and soda pop. Avoid chocolate.   If you smoke or drink alcohol, quit or cut down as much as possible.   Reduce your stress or anxiety level. Biofeedback, yoga, or meditation will help you relax. Physical activity such as swimming, jogging, or walking also may be helpful.  SEEK MEDICAL CARE IF:   You continue to have a fast heartbeat.   Your palpitations occur more often.  SEEK IMMEDIATE MEDICAL CARE IF: You develop chest pain, shortness of breath, severe headache, dizziness, or fainting. Document Released: 07/17/2000 Document Revised: 07/09/2011 Document Reviewed: 09/16/2007 [facility] Patient Information 2012 [place].

## 2011-10-27 NOTE — Progress Notes (Signed)
HPI The patient presents for evaluation of episodes of lightheadedness. She feels. She will get a pounding in her chest. This has been happening at least once per week and with increasing frequency and intensity. She feels faint and lightheaded though she has not had syncope. She cannot precipitate these. A pounding heart rate lasts for only a few minutes but she feels very weak afterward. She's not describing associated nausea vomiting paresis. He has had no new shortness of breath, PND or orthopnea. Of note she has been told she was hypertensive. Her blood pressure is very elevated today. However she says that this is unusual and at times her blood pressure is low.  She has been treated in the past with multiple medications. She reports intolerance to lisinopril Norvasc and atenolol all which made her blood pressure dropped too low.   Allergies  Allergen Reactions  . Penicillins     REACTION: dry mouth  . Statins     Pain and weakness    Current Outpatient Prescriptions  Medication Sig Dispense Refill  . calcium carbonate (OS-CAL) 600 MG TABS Take 600 mg by mouth daily.        . Cholecalciferol (VITAMIN D) 2000 UNITS CAPS Take by mouth 3 (three) times a week.        . estradiol (ESTRACE) 0.1 MG/GM vaginal cream Place 1 g vaginally once a week.        . etodolac (LODINE) 400 MG tablet Take 1 by mouth twice a day      . Multiple Vitamins-Minerals (CENTRUM SILVER) tablet Take 1 tablet by mouth daily.        . Omega-3 Fatty Acids (FISH OIL) 1200 MG CAPS Take 2 each by mouth daily.         Past Medical History  Diagnosis Date  . Obesity, unspecified   . URINARY INCONTINENCE   . SYNCOPE, HX OF   . BENIGN POSITIONAL VERTIGO   . GERD   . DYSLIPIDEMIA   . HYPERTENSION   . Raynaud disease     Past Surgical History  Procedure Date  . Nasal fracture surgery 2006  . Tubal ligation 1980  . Cholecystectomy 1990    Family History  Problem Relation Age of Onset  . Adopted: Yes     History   Social History  . Marital Status: Married    Spouse Name: N/A    Number of Children: 2  . Years of Education: N/A   Occupational History  . Not on file.   Social History Main Topics  . Smoking status: Never Smoker   . 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 Alcohol Use: No  . Drug Use: No  . Sexually Active:    Other Topics Concern  . Not on file   Social History Narrative   She enjoys birding.  Lives at home with husband.    ROS:   Positive for hip pain.  Otherwise as stated in the HPI and negative for all other systems.  PHYSICAL EXAM BP 200/100  Pulse 64  Ht 5' (1.524 m)  Wt 163 lb (73.936 kg)  BMI 31.83 kg/m2 GENERAL:  Well appearing HEENT:  Pupils equal round and reactive, fundi not visualized, oral mucosa unremarkable NECK:  No jugular venous distention, waveform within normal limits, carotid upstroke brisk and symmetric, no bruits, no thyromegaly LYMPHATICS:  No cervical, inguinal adenopathy LUNGS:  Clear to auscultation bilaterally  BACK:  No CVA tenderness CHEST:  Unremarkable HEART:  PMI not displaced or sustained,S1 and S2 within normal limits, no S3, no S4, no clicks, no rubs, no murmurs ABD:  Flat, positive bowel sounds normal in frequency in pitch, no bruits, no rebound, no guarding, no midline pulsatile mass, no hepatomegaly, no splenomegaly EXT:  2 plus pulses throughout, no edema, no cyanosis no clubbing SKIN:  No rashes no nodules NEURO:  Cranial nerves II through XII grossly intact, motor grossly intact throughout [facility]  Cognitively intact, oriented to person place and time  EKG:  10/01/11  sinus rhythm with rate 88, left axis deviation, poor anterior R wave progression , no acute ST-T wave changes. 10/27/2011  ASSESSMENT AND PLAN

## 2011-10-27 NOTE — Assessment & Plan Note (Signed)
I reviewed her labs and her electrolytes and TSH were recently normal. After she has worn her blood pressure monitor she will have a 21 day event monitor.  I will also have a low threshold for 24 hour urine for VMA and metanephrine.

## 2011-10-28 ENCOUNTER — Ambulatory Visit (INDEPENDENT_AMBULATORY_CARE_PROVIDER_SITE_OTHER): Payer: Medicare Other | Admitting: Internal Medicine

## 2011-10-28 ENCOUNTER — Telehealth: Payer: Self-pay

## 2011-10-28 ENCOUNTER — Encounter: Payer: Self-pay | Admitting: Internal Medicine

## 2011-10-28 ENCOUNTER — Other Ambulatory Visit: Payer: Self-pay | Admitting: *Deleted

## 2011-10-28 VITALS — BP 192/110 | HR 103 | Temp 98.2°F

## 2011-10-28 DIAGNOSIS — F411 Generalized anxiety disorder: Secondary | ICD-10-CM | POA: Diagnosis not present

## 2011-10-28 DIAGNOSIS — F419 Anxiety disorder, unspecified: Secondary | ICD-10-CM | POA: Insufficient documentation

## 2011-10-28 DIAGNOSIS — I10 Essential (primary) hypertension: Secondary | ICD-10-CM

## 2011-10-28 MED ORDER — DIAZEPAM 2 MG PO TABS: 2.0000 mg | ORAL_TABLET | Freq: Three times a day (TID) | ORAL | Status: DC | PRN

## 2011-10-28 MED ORDER — HYDROCHLOROTHIAZIDE 12.5 MG PO CAPS: 12.5000 mg | ORAL_CAPSULE | Freq: Every day | ORAL | Status: DC

## 2011-10-28 NOTE — Telephone Encounter (Signed)
yes

## 2011-10-28 NOTE — Patient Instructions (Signed)
It was good to see you today. Valium as needed - Your prescription(s) have been submitted to your pharmacy. Please take as directed and contact our office if you believe you are having problem(s) with the medication(s). Continue working with cardiology for evaluation of palpitations symptoms .

## 2011-10-28 NOTE — Telephone Encounter (Signed)
Pt called stating she was seen at Ortho this pasted Monday and Cardiology yesterday and both visit her BP was elevated due to severe white coat syndrome. Pt says that it was suggested that Dr Asa Lente prescribed an anti-anxiety medication for her, please advise. Does pt need an OV?

## 2011-10-28 NOTE — Assessment & Plan Note (Signed)
Significant component driven by fear of possible med illness - Ongoing eval with card for palpitation symptoms and severe "white coat" hypertension  Start low dose valium for treatement of same - continue to work with cards as ongoing Reviewed in depth possible tx options with pt/spouse today

## 2011-10-28 NOTE — Progress Notes (Signed)
  Subjective:    Patient ID: Tiffany [name], female    DOB: 02/10/1946, 66 y.o.   MRN: [id number]  HPI complains of anxiety symptoms precipitating severe elevation of blood pressure  Noted yesterday at cards as referred for eval of palpitation symptoms -  also recent ortho OV for injection to help leg pain Denies any stimulant use or increase in caffeine   Past Medical History  Diagnosis Date  . Obesity, unspecified   . URINARY INCONTINENCE   . SYNCOPE, HX OF   . BENIGN POSITIONAL VERTIGO   . GERD   . DYSLIPIDEMIA   . HYPERTENSION     severe, pt intol of med tx  . Raynaud disease     Review of Systems  Constitutional: Negative for fever and unexpected weight change.  HENT: Negative for neck pain and tinnitus.   Respiratory: Negative for cough and shortness of breath.   Cardiovascular: Positive for palpitations. Negative for chest pain.       Objective:   Physical Exam BP 192/110  Pulse 103  Temp(Src) 98.2 F (36.8 C) (Oral)  SpO2 97% Wt Readings from Last 3 Encounters:  10/27/11 163 lb (73.936 kg)  05/06/11 165 lb 1.9 oz (74.898 kg)  12/17/10 166 lb (75.297 kg)   Constitutional: She appears well-developed and well-nourished. No distress. spouse at side Neck: Normal range of motion. Neck supple. No JVD present. No thyromegaly present.  Cardiovascular: Normal rate, regular rhythm and normal heart sounds.  No murmur heard. No BLE edema. Pulmonary/Chest: Effort normal and breath sounds normal. No respiratory distress. She has no wheezes.  Psychiatric: She has an anxious mood (baseline) affect. Her behavior is normal. Judgment and thought content normal.   Lab Results  Component Value Date   WBC 7.5 10/01/2011   HGB 15.2* 10/01/2011   HCT 45.0 10/01/2011   PLT 288.0 10/01/2011   GLUCOSE 86 10/01/2011   CHOL 211 02/11/2009   HDL 64 02/11/2009   LDLCALC 110 02/11/2009   ALT 15 10/01/2011   AST 18 10/01/2011   NA 139 10/01/2011   K 3.9 10/01/2011   CL 105 10/01/2011   CREATININE 1.4* 10/01/2011   BUN 21 10/01/2011   CO2 27 10/01/2011   TSH 2.06 10/01/2011      Assessment & Plan:  See problem list. Medications and labs reviewed today.

## 2011-10-28 NOTE — Telephone Encounter (Signed)
Pt advised and transferred to schedule appt

## 2011-10-28 NOTE — Assessment & Plan Note (Signed)
Severe "white coat" per pt - home BP under improved control without meds Prev tx with intol of multiple meds - lisinopril+diuretic, benicar, diovan, benicar, norvasc, atenolol - currently on no meds and feels well - no CP, edema or dizziness off med - home BP log reviewed - Pt declines any rx tx due to these factors - advised to call if home BP readings consistently >140s/90s and pt agrees On 24h monitor BP from cards - planning 21d monitor for palpitations soon BP Readings from Last 3 Encounters:  10/28/11 192/110  10/27/11 200/100  10/01/11 126/92

## 2011-11-26 DIAGNOSIS — M25559 Pain in unspecified hip: Secondary | ICD-10-CM | POA: Diagnosis not present

## 2011-11-26 DIAGNOSIS — M169 Osteoarthritis of hip, unspecified: Secondary | ICD-10-CM | POA: Diagnosis not present

## 2011-12-08 ENCOUNTER — Encounter: Payer: Self-pay | Admitting: Cardiology

## 2011-12-08 ENCOUNTER — Ambulatory Visit (INDEPENDENT_AMBULATORY_CARE_PROVIDER_SITE_OTHER): Payer: Medicare Other | Admitting: Cardiology

## 2011-12-08 VITALS — BP 155/90 | HR 92 | Ht 60.0 in | Wt 162.0 lb

## 2011-12-08 DIAGNOSIS — R002 Palpitations: Secondary | ICD-10-CM | POA: Diagnosis not present

## 2011-12-08 DIAGNOSIS — I10 Essential (primary) hypertension: Secondary | ICD-10-CM | POA: Diagnosis not present

## 2011-12-08 NOTE — Progress Notes (Signed)
HPI The patient presents for evaluation of episodes of lightheadedness. She has been treated in the past with multiple medications. She reports intolerance to lisinopril,  Norvasc and atenolol all which made her blood pressure dropped too low.   At the last appt I ordered a 24 hour BP monitor and a 21 day event monitor.  She wore both although she reported significant stress while wearing the BP cuff.  Her readings were dramatically elevated during awake and resting hours.  She does not believe however that these were representative of her true blood pressure.  Her event monitor demonstrated occasional PACs.  She was started on when necessary Valium but takes this rarely. She reports that her blood pressure is well controlled. She actually complains more of low blood pressures and presyncope with a readings in the [id number] systolic. He typically has warning that this is going to happen and it happens typically when she standing. She's able to sit down.  Allergies  Allergen Reactions  . Penicillins     REACTION: dry mouth  . Statins     Pain and weakness    Current Outpatient Prescriptions  Medication Sig Dispense Refill  . calcium carbonate (OS-CAL) 600 MG TABS Take 600 mg by mouth daily.        . Cholecalciferol (VITAMIN D) 2000 UNITS CAPS Take by mouth 3 (three) times a week.        . diazepam (VALIUM) 2 MG tablet Take 2 mg by mouth as needed.      Marland Kitchen estradiol (ESTRACE) 0.1 MG/GM vaginal cream Place 1 g vaginally once a week.        . etodolac (LODINE) 400 MG tablet daily. Take 1 by moutha day      . hydrochlorothiazide (MICROZIDE) 12.5 MG capsule Take 1 capsule (12.5 mg total) by mouth daily.  30 capsule  6  . Multiple Vitamins-Minerals (CENTRUM SILVER) tablet Take 1 tablet by mouth daily.        . Omega-3 Fatty Acids (FISH OIL) 1200 MG CAPS Take 2 each by mouth daily.         Past Medical History  Diagnosis Date  . Obesity, unspecified   . URINARY INCONTINENCE   . SYNCOPE, HX OF   .  BENIGN POSITIONAL VERTIGO   . GERD   . DYSLIPIDEMIA   . HYPERTENSION     severe, pt intol of med tx  . Raynaud disease     Past Surgical History  Procedure Date  . Nasal fracture surgery 2006  . Tubal ligation 1980  . Cholecystectomy 1990    ROS:   Positive for hip pain.  Otherwise as stated in the HPI and negative for all other systems.  PHYSICAL EXAM BP 155/90  Pulse 92  Ht 5' (1.524 m)  Wt 162 lb (73.483 kg)  BMI 31.64 kg/m2 GENERAL:  Well appearing HEENT:  Pupils equal round and reactive, fundi not visualized, oral mucosa unremarkable NECK:  No jugular venous distention, waveform within normal limits, carotid upstroke brisk and symmetric, no bruits, no thyromegaly LYMPHATICS:  No cervical, inguinal adenopathy LUNGS:  Clear to auscultation bilaterally BACK:  No CVA tenderness CHEST:  Unremarkable HEART:  PMI not displaced or sustained,S1 and S2 within normal limits, no S3, no S4, no clicks, no rubs, no murmurs ABD:  Flat, positive bowel sounds normal in frequency in pitch, no bruits, no rebound, no guarding, no midline pulsatile mass, no hepatomegaly, no splenomegaly EXT:  2 plus pulses throughout, no edema, no  cyanosis no clubbing   EKG:  10/01/11  sinus rhythm with rate 92, left axis deviation, poor anterior R wave progression , no acute ST-T wave changes. 12/08/2011  ASSESSMENT AND PLAN

## 2011-12-08 NOTE — Assessment & Plan Note (Signed)
As above.

## 2011-12-08 NOTE — Patient Instructions (Signed)
The current medical regimen is effective;  continue present plan and medications.  Please obtain container for 24 hour urine.  Follow up in 6 months with Dr Percival Spanish.  You will receive a letter in the mail 2 months before you are due.  Please call us when you receive this letter to schedule your follow up appointment.\

## 2011-12-08 NOTE — Assessment & Plan Note (Addendum)
She agrees to take the HCTZ that was prescribed.  Did discuss conservative therapies for her presyncope. I would not prescribe midodrine or fludrocortisone in this situation.  I suggested compression stockings and avoidance. She'll let me know if this worsens.  I will check a 24-hour urine for metanephrines and catecholamines.  She understands that I think that her BP is being under treated and that the results of her BP monitor would suggest a significant risk for stroke. She does not want to take other antihypertensives as this time and accepts the possibility that her BP is at times possibly dangerously high.  She doubts that it reaches these levels in her daily life.

## 2011-12-13 LAB — CATECHOLAMINES, FRACTIONATED, URINE, 24 HOUR
Calculated Total (E+NE): 36 mcg/24 h (ref 26–121)
Creatinine, Urine mg/day-CATEUR: 0.99 g/(24.h) (ref 0.63–2.50)
Dopamine, 24 hr Urine: 209 mcg/24 h (ref 52–480)
Norepinephrine, 24 hr Ur: 36 mcg/24 h (ref 15–100)
Total Volume - CF 24Hr U: 1600 mL

## 2011-12-13 LAB — METANEPHRINES, URINE, 24 HOUR
Metaneph Total, Ur: 376 mcg/24 h (ref 224–832)
Metanephrines, Ur: 66 mcg/24 h — ABNORMAL LOW (ref 90–315)
Normetanephrine, 24H Ur: 310 mcg/24 h (ref 122–676)

## 2011-12-17 ENCOUNTER — Telehealth: Payer: Self-pay

## 2011-12-17 NOTE — Telephone Encounter (Signed)
Patient is aware of urine results

## 2012-03-07 DIAGNOSIS — M169 Osteoarthritis of hip, unspecified: Secondary | ICD-10-CM | POA: Diagnosis not present

## 2012-03-09 ENCOUNTER — Telehealth: Payer: Self-pay | Admitting: Internal Medicine

## 2012-03-09 ENCOUNTER — Encounter: Payer: Self-pay | Admitting: Internal Medicine

## 2012-03-09 DIAGNOSIS — Z09 Encounter for follow-up examination after completed treatment for conditions other than malignant neoplasm: Secondary | ICD-10-CM | POA: Diagnosis not present

## 2012-03-09 DIAGNOSIS — N6009 Solitary cyst of unspecified breast: Secondary | ICD-10-CM | POA: Diagnosis not present

## 2012-03-09 LAB — HM MAMMOGRAPHY

## 2012-03-09 NOTE — Telephone Encounter (Signed)
Forward 1 page from [facility] to Dr. Gwendolyn Grant for review on 03-09-12 ym

## 2012-03-14 ENCOUNTER — Ambulatory Visit (INDEPENDENT_AMBULATORY_CARE_PROVIDER_SITE_OTHER): Payer: Medicare Other | Admitting: Internal Medicine

## 2012-03-14 ENCOUNTER — Encounter: Payer: Self-pay | Admitting: Internal Medicine

## 2012-03-14 VITALS — BP 158/102 | HR 87 | Temp 98.4°F | Ht 60.0 in | Wt 163.1 lb

## 2012-03-14 DIAGNOSIS — M169 Osteoarthritis of hip, unspecified: Secondary | ICD-10-CM

## 2012-03-14 DIAGNOSIS — Z136 Encounter for screening for cardiovascular disorders: Secondary | ICD-10-CM

## 2012-03-14 DIAGNOSIS — F411 Generalized anxiety disorder: Secondary | ICD-10-CM | POA: Diagnosis not present

## 2012-03-14 DIAGNOSIS — M1611 Unilateral primary osteoarthritis, right hip: Secondary | ICD-10-CM

## 2012-03-14 DIAGNOSIS — F419 Anxiety disorder, unspecified: Secondary | ICD-10-CM

## 2012-03-14 DIAGNOSIS — I10 Essential (primary) hypertension: Secondary | ICD-10-CM | POA: Diagnosis not present

## 2012-03-14 MED ORDER — DIAZEPAM 5 MG PO TABS: 5.0000 mg | ORAL_TABLET | Freq: Three times a day (TID) | ORAL | Status: DC | PRN

## 2012-03-14 NOTE — Assessment & Plan Note (Signed)
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

## 2012-03-14 NOTE — Assessment & Plan Note (Signed)
Significant component driven by fear of possible med illness - Ongoing eval with card for palpitation symptoms and severe "white coat" hypertension  Started low dose valium prn 10/2011 for same - improved  continue valium as needed for treatment of same -  Reviewed in depth possible tx options with pt today

## 2012-03-14 NOTE — Patient Instructions (Addendum)
It was good to see you today. You have been evaluated tody and it is felt that your medical surgical risk is moderate due to uncontrolled blood pressure, but outweighed by the potential benefit of the surgery. Therefore, you medically clear to proceed when scheduling allows. We will let Dr Alvan Dame know same Continue Valium as needed: max 5mg  3x/day as needed - Your prescription(s) have been submitted to your pharmacy. Please take as directed and contact our office if you believe you are having problem(s) with the medication(s). Please schedule followup in 6 months, call sooner if problems.

## 2012-03-14 NOTE — Assessment & Plan Note (Signed)
Severe "white coat" per pt - home BP under improved control with prn hctz only Prev tx with intol of multiple meds - lisinopril+diuretic, benicar, diovan, benicar, norvasc, atenolol - currently on no  X 48h and feels well - no chest pain, edema or dizziness - home BP log reviewed - Pt declines any rx tx due to these factors - advised to call if home BP readings consistently >140s/90s and pt agrees s/p 24h monitor BP from cards 10/2011 and 21d BP monitor 11/2011  S/p cards eval spring 2013 - no evidence of cardiac abnormality Needs to also control anxiety to manage same  BP Readings from Last 3 Encounters:  03/14/12 158/102  12/08/11 155/90  10/28/11 192/110

## 2012-03-14 NOTE — Progress Notes (Signed)
Subjective:    Patient ID: Tiffany [name], female    DOB: [birth year], 66 y.o.   MRN: [id number]  HPI Here for preop clearance - requested by Alvan Dame and planning R THA, anterior approach no chest pain, change in edema - no shortness of breath or dyspnea on exertion  no history of coronary artery disease or active kidney disease; no diabetes mellitus   pt able to walk 2 blocks without fatigue (but limited by pain in RLE)   Also reviewed chronic medical issues:  RLE pain - located in R low back pain, R hip and groin Hx same since 2010, known sciatica - Progressive symptoms - weakness as well as constant ache/pain symptoms worse sitting and bending forward and with activity- improved standing and rest, No fever, weight loss or urinary issues  HTN, uncontrolled -?white coat as pt report good blood pressure control at home - intol of multiple meds: lisinopril+diuretic, benicar, diovan, benicar, norvasc, atenolol - prior eval by cards 10/2011 and 12/2011 for same - uncontrolled on 24h bp monitor 10/2011 and 12/2011 x 21d -currently on prn diuretic and feels well - no chest pain, palpitations or dizziness at this time, but feels "terrible" if taking hctz regualrly - home BP log reviewed - range SBP 110-140s - declines other medicine tx  dyslipidemia - intol of many meds  - muscle cramps on simvastin, niaspan, welchol - still feels thighs are weak due to prior tx - improved with PT. taking fish oil w/o problems - reports "good ratio" and does not feel need for other meds  GERD - no active symptoms at this time -  no hx ulcers - takes OTC meds only as needed   CKD hx - prev [place] - reports renal fx has improved so no longer following there  Past Medical History  Diagnosis Date  . Obesity, unspecified   . URINARY INCONTINENCE   . SYNCOPE, HX OF   . BENIGN POSITIONAL VERTIGO   . GERD   . DYSLIPIDEMIA   . HYPERTENSION     severe, pt intol of med tx  . Raynaud  disease      Review of Systems  Constitutional: Negative for unexpected weight change.  Respiratory: Negative for shortness of breath.   Cardiovascular: Negative for chest pain, palpitations and leg swelling.  Neurological: Negative for headaches.       Objective:   Physical Exam  BP 158/102  Pulse 87  Temp 98.4 F (36.9 C) (Oral)  Ht 5' (1.524 m)  Wt 163 lb 1.9 oz (73.991 kg)  BMI 31.86 kg/m2   Wt Readings from Last 3 Encounters:  03/14/12 163 lb 1.9 oz (73.991 kg)  12/08/11 162 lb (73.483 kg)  10/27/11 163 lb (73.936 kg)   Constitutional: She is overweight; appears well-developed and well-nourished. No distress. Neck: Normal range of motion. Neck supple. No JVD present. No thyromegaly present.  Cardiovascular: Normal rate, regular rhythm and normal heart sounds.  No murmur heard. No BLE edema. Pulmonary/Chest: Effort normal and breath sounds normal. No respiratory distress. She has no wheezes.    Lab Results  Component Value Date   WBC 7.5 10/01/2011   HGB 15.2* 10/01/2011   HCT 45.0 10/01/2011   PLT 288.0 10/01/2011   CHOL 211 02/11/2009   HDL 64 02/11/2009   ALT 15 10/01/2011   AST 18 10/01/2011   NA 139 10/01/2011   K 3.9 10/01/2011   CL 105 10/01/2011   CREATININE  1.4* 10/01/2011   BUN 21 10/01/2011   CO2 27 10/01/2011   TSH 2.06 10/01/2011   ECG: sinus @ 87bpm - R axis and low voltage - unchanged from 09/2011     Assessment & Plan:  Preop clearance - This patient has been evaluated and it is felt that the surgical risk is moderate due to uncontrolled blood pressure, but risk appears outweighed by the potential benefit of the surgery. Therefore, medically clear to proceed when scheduling allows.   Also see problem list. Medications and labs reviewed today.

## 2012-04-13 NOTE — H&P (Signed)
Tiffany [name] is an 66 y.o. female.    Chief Complaint:   Right hip OA and pain   HPI: Pt is a 66 y.o. female complaining of right hip pain for at least 4 years. Pain had continually increased since the beginning. X-rays in the clinic show end-stage arthritic changes of the right hip. Pt has tried various conservative treatments which have failed to alleviate their symptoms, including a intra-articular steroid injection that helped for approximately 8 weeks. Various options are discussed with the patient. Risks, benefits and expectations were discussed with the patient. Patient understand the risks, benefits and expectations and wishes to proceed with surgery.   PCP:  Gwendolyn Grant, MD  D/C Plans:  Home with HHPT  Post-op Meds:   Rx given for ASA, Robaxin, Celebrex, Iron, Colace and MiraLax  Tranexamic Acid:   To be given  Decadron:   To be given  PMH: Past Medical History  Diagnosis Date  . Obesity, unspecified   . URINARY INCONTINENCE   . SYNCOPE, HX OF   . BENIGN POSITIONAL VERTIGO   . GERD   . DYSLIPIDEMIA   . HYPERTENSION     severe, pt intol of med tx  . Raynaud disease     PSH: Past Surgical History  Procedure Date  . Nasal fracture surgery 2006  . Tubal ligation 1980  . Cholecystectomy 1990    Social History:  reports that she has never smoked. She does not have any smokeless tobacco history on file. She reports that she does not drink alcohol or use illicit drugs.  Allergies:  Allergies  Allergen Reactions  . Penicillins     REACTION: dry mouth  . Statins     "Pain, weakness and kidney problems"    Medications: No current facility-administered medications for this encounter.   Current Outpatient Prescriptions  Medication Sig Dispense Refill  . calcium carbonate (OS-CAL) 600 MG TABS Take 600 mg by mouth daily.        . Cholecalciferol (VITAMIN D) 2000 UNITS CAPS Take by mouth 3 (three) times a week.        . diazepam (VALIUM) 5 MG tablet Take 1  tablet (5 mg total) by mouth every 8 (eight) hours as needed for anxiety.  90 tablet  1  . estradiol (ESTRACE) 0.1 MG/GM vaginal cream Place 1 g vaginally once a week.        . etodolac (LODINE) 400 MG tablet daily. Take 1 by moutha day      . hydrochlorothiazide (MICROZIDE) 12.5 MG capsule Take 1 capsule (12.5 mg total) by mouth daily.  30 capsule  6  . Multiple Vitamins-Minerals (CENTRUM SILVER) tablet Take 1 tablet by mouth daily.        . Omega-3 Fatty Acids (FISH OIL) 1200 MG CAPS Take 2 each by mouth daily.       . traMADol (ULTRAM) 50 MG tablet Take 1-2 by mouth twice a day as needed        ROS: Review of Systems  Constitutional: Negative.   HENT: Negative.   Eyes: Negative.   Respiratory: Negative.   Cardiovascular: Negative.   Gastrointestinal: Negative.   Genitourinary: Positive for frequency.  Musculoskeletal: Positive for myalgias and joint pain.  Skin: Negative.   Neurological: Negative.   Endo/Heme/Allergies: Negative.   Psychiatric/Behavioral: Negative.      Physical Exam: BP:   145/99  ;  HR:   92  ; Resp:   18  ; Physical Exam  Constitutional:  She is oriented to person, place, and time and well-developed, well-nourished, and in no distress.  HENT:  Head: Normocephalic and atraumatic.  Nose: Nose normal.  Mouth/Throat: Oropharynx is clear and moist.  Eyes: Pupils are equal, round, and reactive to light.  Neck: Neck supple. No JVD present. No tracheal deviation present. No thyromegaly present.  Cardiovascular: Normal rate, regular rhythm, normal heart sounds and intact distal pulses.   Pulmonary/Chest: Effort normal and breath sounds normal. No stridor. No respiratory distress. She has no wheezes.  Abdominal: Soft. There is no tenderness. There is no guarding.  Musculoskeletal:       Right hip: She exhibits decreased range of motion, decreased strength, tenderness, bony tenderness and crepitus. She exhibits no swelling, no deformity and no laceration.    Lymphadenopathy:    She has no cervical adenopathy.  Neurological: She is alert and oriented to person, place, and time.  Skin: Skin is warm and dry.     Assessment/Plan Assessment:   Right hip OA and pain   Plan: Patient will undergo a right total hip arthroplasty, anterior approach on 04/26/2012 per Dr. Alvan Dame at [facility]. Risks benefits and expectations were discussed with the patient. Patient understand risks, benefits and expectations and wishes to proceed.   Tiffany [name]   PAC  04/13/2012, 4:53 PM

## 2012-04-18 ENCOUNTER — Encounter (HOSPITAL_COMMUNITY): Payer: Self-pay | Admitting: Pharmacy Technician

## 2012-04-20 ENCOUNTER — Encounter (HOSPITAL_COMMUNITY)
Admission: RE | Admit: 2012-04-20 | Discharge: 2012-04-20 | Disposition: A | Discharge status: Home / Self Care | Payer: Medicare Other | Source: Ambulatory Visit | Attending: Orthopedic Surgery | Admitting: Orthopedic Surgery

## 2012-04-20 ENCOUNTER — Encounter (HOSPITAL_COMMUNITY): Payer: Self-pay

## 2012-04-20 DIAGNOSIS — M1990 Unspecified osteoarthritis, unspecified site: Secondary | ICD-10-CM | POA: Insufficient documentation

## 2012-04-20 DIAGNOSIS — T4145XA Adverse effect of unspecified anesthetic, initial encounter: Secondary | ICD-10-CM | POA: Insufficient documentation

## 2012-04-20 DIAGNOSIS — T8859XA Other complications of anesthesia, initial encounter: Secondary | ICD-10-CM

## 2012-04-20 DIAGNOSIS — R32 Unspecified urinary incontinence: Secondary | ICD-10-CM

## 2012-04-20 HISTORY — DX: Other complications of anesthesia, initial encounter: T88.59XA

## 2012-04-20 HISTORY — DX: Adverse effect of unspecified anesthetic, initial encounter: T41.45XA

## 2012-04-20 HISTORY — DX: Unspecified osteoarthritis, unspecified site: M19.90

## 2012-04-20 HISTORY — DX: Unspecified urinary incontinence: R32

## 2012-04-20 LAB — CBC
HCT: 44.2 % (ref 36.0–46.0)
Hemoglobin: 15.1 g/dL — ABNORMAL HIGH (ref 12.0–15.0)
MCH: 31.1 pg (ref 26.0–34.0)
MCHC: 34.2 g/dL (ref 30.0–36.0)
MCV: 90.9 fL (ref 78.0–100.0)
Platelets: 258 10*3/uL (ref 150–400)
RBC: 4.86 MIL/uL (ref 3.87–5.11)
RDW: 13.9 % (ref 11.5–15.5)
WBC: 6.4 10*3/uL (ref 4.0–10.5)

## 2012-04-20 LAB — URINALYSIS, ROUTINE W REFLEX MICROSCOPIC
Glucose, UA: NEGATIVE mg/dL
Hgb urine dipstick: NEGATIVE
Nitrite: NEGATIVE
Protein, ur: NEGATIVE mg/dL
Specific Gravity, Urine: 1.028 (ref 1.005–1.030)
Urobilinogen, UA: 0.2 mg/dL (ref 0.0–1.0)
pH: 5.5 (ref 5.0–8.0)

## 2012-04-20 LAB — URINE MICROSCOPIC-ADD ON

## 2012-04-20 LAB — BASIC METABOLIC PANEL
BUN: 17 mg/dL (ref 6–23)
CO2: 27 mEq/L (ref 19–32)
Calcium: 9.3 mg/dL (ref 8.4–10.5)
Chloride: 101 mEq/L (ref 96–112)
Creatinine, Ser: 1.16 mg/dL — ABNORMAL HIGH (ref 0.50–1.10)
GFR calc Af Amer: 55 mL/min — ABNORMAL LOW (ref >90)
GFR calc non Af Amer: 48 mL/min — ABNORMAL LOW (ref >90)
Glucose, Bld: 81 mg/dL (ref 70–99)
Potassium: 4 mEq/L (ref 3.5–5.1)
Sodium: 138 mEq/L (ref 135–145)

## 2012-04-20 LAB — PROTIME-INR
INR: 0.98 (ref 0.00–1.49)
Prothrombin Time: 12.9 seconds (ref 11.6–15.2)

## 2012-04-20 LAB — SURGICAL PCR SCREEN
MRSA, PCR: NEGATIVE
Staphylococcus aureus: POSITIVE — AB

## 2012-04-20 LAB — APTT: aPTT: 32 seconds (ref 24–37)

## 2012-04-20 NOTE — Pre-Procedure Instructions (Addendum)
04-20-12 [id number], CXR- 2'13- Epic. 04-20-12 1515 Urinalysis result faxed to Dr. Aurea Graff office, included is CBC, BMP results. W. Teandra Harlan,RN 04-21-12 0830 Spoke with spouse- informed him to make pt. Aware to [facility] Rx. Mupirocin and use as directed-call if any concerns.WFloy Sabina 04-21-12 Faxed received Rx. For Cipro 500mg  twice daily x5 days preop per Dr. Alvan Dame -called to pt's pharmacy- [facility].W. Shenita Trego,RN 04-22-12 1655 pt. Notified of surgery time change to 1025 and to arrive at 0800 to Short Stay.W. Floy Sabina

## 2012-04-20 NOTE — Patient Instructions (Addendum)
[place]  04/20/2012   Your procedure is scheduled on:  9-24 -2013  Report to [place] at     0930   AM.  Call this number if you have problems the morning of surgery: [phone]  Or Presurgical Testing [phone](Kaleab Frasier)   Remember:   Do not eat food:After Midnight.  May have clear liquids:up to 6 Hours before arrival. Nothing after : 0600 AM  Clear liquids include soda, tea, black coffee, apple or grape juice, broth.   Take these medicines the morning of surgery with A SIP OF WATER: Tramadol, Diazepam.   Do not wear jewelry, make-up or nail polish.  Do not wear lotions, powders, or perfumes. You may wear deodorant.  Do not shave 48 hours prior to surgery.(face and neck okay, no shaving of legs)  Do not bring valuables to the hospital.  Contacts, dentures or bridgework may not be worn into surgery.  Leave suitcase in the car. After surgery it may be brought to your room.  For patients admitted to the hospital, checkout time is 11:00 AM the day of discharge.   Patients discharged the day of surgery will not be allowed to drive home. Must have responsible person with you x 24 hours once discharged.  Name and phone number of your driver:   Special Instructions: CHG Shower Use Special Wash: 1/2 bottle night before surgery and 1/2 bottle morning of surgery.(avoid face and genitals)-see special instruction sheet.   Please read over the following fact sheets that you were given: MRSA Information, Blood Transfusion fact sheet, Incentive Spirometry Instruction.

## 2012-04-26 ENCOUNTER — Encounter (HOSPITAL_COMMUNITY)
Admission: RE | Disposition: A | Discharge status: Home Health | Payer: Self-pay | Source: Ambulatory Visit | Attending: Orthopedic Surgery

## 2012-04-26 ENCOUNTER — Inpatient Hospital Stay (HOSPITAL_COMMUNITY): Payer: Medicare Other

## 2012-04-26 ENCOUNTER — Inpatient Hospital Stay (HOSPITAL_COMMUNITY): Payer: Medicare Other | Admitting: Anesthesiology

## 2012-04-26 ENCOUNTER — Encounter (HOSPITAL_COMMUNITY): Payer: Self-pay | Admitting: *Deleted

## 2012-04-26 ENCOUNTER — Inpatient Hospital Stay (HOSPITAL_COMMUNITY)
Admission: RE | Admit: 2012-04-26 | Discharge: 2012-04-28 | DRG: 470 | Disposition: A | Discharge status: Home Health | Payer: Medicare Other | Source: Ambulatory Visit | Attending: Orthopedic Surgery | Admitting: Orthopedic Surgery

## 2012-04-26 ENCOUNTER — Encounter (HOSPITAL_COMMUNITY): Payer: Self-pay | Admitting: Anesthesiology

## 2012-04-26 DIAGNOSIS — E785 Hyperlipidemia, unspecified: Secondary | ICD-10-CM | POA: Diagnosis present

## 2012-04-26 DIAGNOSIS — M1610 Unilateral primary osteoarthritis, unspecified hip: Principal | ICD-10-CM | POA: Diagnosis present

## 2012-04-26 DIAGNOSIS — E876 Hypokalemia: Secondary | ICD-10-CM | POA: Diagnosis not present

## 2012-04-26 DIAGNOSIS — Z01812 Encounter for preprocedural laboratory examination: Secondary | ICD-10-CM

## 2012-04-26 DIAGNOSIS — F411 Generalized anxiety disorder: Secondary | ICD-10-CM | POA: Diagnosis present

## 2012-04-26 DIAGNOSIS — Z888 Allergy status to other drugs, medicaments and biological substances status: Secondary | ICD-10-CM

## 2012-04-26 DIAGNOSIS — I7300 Raynaud's syndrome without gangrene: Secondary | ICD-10-CM | POA: Diagnosis present

## 2012-04-26 DIAGNOSIS — M169 Osteoarthritis of hip, unspecified: Secondary | ICD-10-CM | POA: Diagnosis not present

## 2012-04-26 DIAGNOSIS — D62 Acute posthemorrhagic anemia: Secondary | ICD-10-CM | POA: Diagnosis not present

## 2012-04-26 DIAGNOSIS — I10 Essential (primary) hypertension: Secondary | ICD-10-CM | POA: Diagnosis present

## 2012-04-26 DIAGNOSIS — K219 Gastro-esophageal reflux disease without esophagitis: Secondary | ICD-10-CM | POA: Diagnosis present

## 2012-04-26 DIAGNOSIS — R32 Unspecified urinary incontinence: Secondary | ICD-10-CM | POA: Diagnosis present

## 2012-04-26 DIAGNOSIS — Z880 Allergy status to penicillin: Secondary | ICD-10-CM

## 2012-04-26 DIAGNOSIS — Z79899 Other long term (current) drug therapy: Secondary | ICD-10-CM | POA: Diagnosis not present

## 2012-04-26 DIAGNOSIS — M25559 Pain in unspecified hip: Secondary | ICD-10-CM | POA: Diagnosis not present

## 2012-04-26 DIAGNOSIS — Z6832 Body mass index (BMI) 32.0-32.9, adult: Secondary | ICD-10-CM

## 2012-04-26 DIAGNOSIS — Z9089 Acquired absence of other organs: Secondary | ICD-10-CM | POA: Diagnosis not present

## 2012-04-26 DIAGNOSIS — D500 Iron deficiency anemia secondary to blood loss (chronic): Secondary | ICD-10-CM

## 2012-04-26 DIAGNOSIS — E669 Obesity, unspecified: Secondary | ICD-10-CM | POA: Diagnosis present

## 2012-04-26 DIAGNOSIS — Z9889 Other specified postprocedural states: Secondary | ICD-10-CM | POA: Diagnosis not present

## 2012-04-26 DIAGNOSIS — Z96649 Presence of unspecified artificial hip joint: Secondary | ICD-10-CM

## 2012-04-26 HISTORY — PX: TOTAL HIP ARTHROPLASTY: SHX124

## 2012-04-26 LAB — TYPE AND SCREEN
ABO/RH(D): A POS
Antibody Screen: NEGATIVE

## 2012-04-26 LAB — ABO/RH: ABO/RH(D): A POS

## 2012-04-26 SURGERY — ARTHROPLASTY, HIP, TOTAL, ANTERIOR APPROACH
Anesthesia: General | Site: Hip | Laterality: Right | Wound class: Clean

## 2012-04-26 MED ORDER — GLYCOPYRROLATE 0.2 MG/ML IJ SOLN
INTRAMUSCULAR | Status: DC | PRN
  Administered 2012-04-26: 0.4 mg via INTRAVENOUS

## 2012-04-26 MED ORDER — PROMETHAZINE HCL 25 MG/ML IJ SOLN: 6.2500 mg | INTRAMUSCULAR | Status: DC | PRN

## 2012-04-26 MED ORDER — ROCURONIUM BROMIDE 100 MG/10ML IV SOLN
INTRAVENOUS | Status: DC | PRN
  Administered 2012-04-26: 10 mg via INTRAVENOUS
  Administered 2012-04-26: 40 mg via INTRAVENOUS

## 2012-04-26 MED ORDER — SUCCINYLCHOLINE CHLORIDE 20 MG/ML IJ SOLN
INTRAMUSCULAR | Status: DC | PRN
  Administered 2012-04-26: 100 mg via INTRAVENOUS

## 2012-04-26 MED ORDER — MIDAZOLAM HCL 5 MG/5ML IJ SOLN
INTRAMUSCULAR | Status: DC | PRN
  Administered 2012-04-26: 2 mg via INTRAVENOUS

## 2012-04-26 MED ORDER — ALUMINUM HYDROXIDE GEL 320 MG/5ML PO SUSP
15.0000 mL | ORAL | Status: DC | PRN
  Filled 2012-04-26: qty 30

## 2012-04-26 MED ORDER — RIVAROXABAN 10 MG PO TABS
10.0000 mg | ORAL_TABLET | Freq: Every day | ORAL | Status: DC
  Administered 2012-04-27 – 2012-04-28 (×2): 10 mg via ORAL
  Filled 2012-04-26 (×3): qty 1

## 2012-04-26 MED ORDER — DOCUSATE SODIUM 100 MG PO CAPS
100.0000 mg | ORAL_CAPSULE | Freq: Two times a day (BID) | ORAL | Status: DC
  Administered 2012-04-26 – 2012-04-27 (×3): 100 mg via ORAL

## 2012-04-26 MED ORDER — POLYETHYLENE GLYCOL 3350 17 G PO PACK: 17.0000 g | PACK | Freq: Every day | ORAL | Status: DC | PRN

## 2012-04-26 MED ORDER — DEXAMETHASONE SODIUM PHOSPHATE 10 MG/ML IJ SOLN
10.0000 mg | Freq: Once | INTRAMUSCULAR | Status: DC
  Filled 2012-04-26: qty 1

## 2012-04-26 MED ORDER — DIAZEPAM 5 MG PO TABS
5.0000 mg | ORAL_TABLET | Freq: Three times a day (TID) | ORAL | Status: DC | PRN
  Administered 2012-04-26 – 2012-04-27 (×2): 5 mg via ORAL
  Filled 2012-04-26 (×2): qty 1

## 2012-04-26 MED ORDER — LIDOCAINE HCL (CARDIAC) 20 MG/ML IV SOLN
INTRAVENOUS | Status: DC | PRN
  Administered 2012-04-26: 50 mg via INTRAVENOUS

## 2012-04-26 MED ORDER — METHOCARBAMOL 100 MG/ML IJ SOLN
500.0000 mg | Freq: Four times a day (QID) | INTRAVENOUS | Status: DC | PRN
  Administered 2012-04-26: 500 mg via INTRAVENOUS
  Filled 2012-04-26: qty 5

## 2012-04-26 MED ORDER — HYDROMORPHONE HCL PF 1 MG/ML IJ SOLN
INTRAMUSCULAR | Status: AC
  Filled 2012-04-26: qty 1

## 2012-04-26 MED ORDER — DIPHENHYDRAMINE HCL 12.5 MG/5ML PO ELIX: 25.0000 mg | ORAL_SOLUTION | Freq: Four times a day (QID) | ORAL | Status: DC | PRN

## 2012-04-26 MED ORDER — METHOCARBAMOL 500 MG PO TABS
500.0000 mg | ORAL_TABLET | Freq: Four times a day (QID) | ORAL | Status: DC | PRN
  Administered 2012-04-27: 500 mg via ORAL
  Filled 2012-04-26: qty 1

## 2012-04-26 MED ORDER — CEFAZOLIN SODIUM 1-5 GM-% IV SOLN
1.0000 g | Freq: Four times a day (QID) | INTRAVENOUS | Status: AC
  Administered 2012-04-26 (×2): 1 g via INTRAVENOUS
  Filled 2012-04-26 (×2): qty 50

## 2012-04-26 MED ORDER — FENTANYL CITRATE 0.05 MG/ML IJ SOLN
INTRAMUSCULAR | Status: DC | PRN
  Administered 2012-04-26 (×5): 50 ug via INTRAVENOUS

## 2012-04-26 MED ORDER — 0.9 % SODIUM CHLORIDE (POUR BTL) OPTIME
TOPICAL | Status: DC | PRN
  Administered 2012-04-26: 1000 mL

## 2012-04-26 MED ORDER — CEFAZOLIN SODIUM-DEXTROSE 2-3 GM-% IV SOLR
2.0000 g | INTRAVENOUS | Status: AC
  Administered 2012-04-26: 2 g via INTRAVENOUS

## 2012-04-26 MED ORDER — ACETAMINOPHEN 10 MG/ML IV SOLN
INTRAVENOUS | Status: DC | PRN
  Administered 2012-04-26: 1000 mg via INTRAVENOUS

## 2012-04-26 MED ORDER — ONDANSETRON HCL 4 MG/2ML IJ SOLN
INTRAMUSCULAR | Status: DC | PRN
  Administered 2012-04-26: 4 mg via INTRAVENOUS

## 2012-04-26 MED ORDER — LACTATED RINGERS IV SOLN
INTRAVENOUS | Status: DC
  Administered 2012-04-26: 1000 mL via INTRAVENOUS

## 2012-04-26 MED ORDER — HYDROMORPHONE HCL PF 1 MG/ML IJ SOLN
0.2000 mg | INTRAMUSCULAR | Status: DC | PRN
  Administered 2012-04-26: 0.5 mg via INTRAVENOUS

## 2012-04-26 MED ORDER — PROPOFOL 10 MG/ML IV BOLUS
INTRAVENOUS | Status: DC | PRN
  Administered 2012-04-26: 150 mg via INTRAVENOUS

## 2012-04-26 MED ORDER — ONDANSETRON HCL 4 MG PO TABS: 4.0000 mg | ORAL_TABLET | Freq: Four times a day (QID) | ORAL | Status: DC | PRN

## 2012-04-26 MED ORDER — DEXAMETHASONE SODIUM PHOSPHATE 10 MG/ML IJ SOLN
INTRAMUSCULAR | Status: DC | PRN
  Administered 2012-04-26: 10 mg via INTRAVENOUS

## 2012-04-26 MED ORDER — HYDROCHLOROTHIAZIDE 12.5 MG PO CAPS
12.5000 mg | ORAL_CAPSULE | Freq: Once | ORAL | Status: AC
  Administered 2012-04-27: 12.5 mg via ORAL
  Filled 2012-04-26: qty 1

## 2012-04-26 MED ORDER — HYDROCHLOROTHIAZIDE 12.5 MG PO CAPS
12.5000 mg | ORAL_CAPSULE | Freq: Every day | ORAL | Status: DC
  Administered 2012-04-26 – 2012-04-27 (×2): 12.5 mg via ORAL
  Filled 2012-04-26 (×3): qty 1

## 2012-04-26 MED ORDER — CHLORHEXIDINE GLUCONATE 4 % EX LIQD
60.0000 mL | Freq: Once | CUTANEOUS | Status: DC
  Filled 2012-04-26: qty 60

## 2012-04-26 MED ORDER — SODIUM CHLORIDE 0.9 % IV SOLN
INTRAVENOUS | Status: DC
  Administered 2012-04-26 – 2012-04-27 (×2): via INTRAVENOUS
  Filled 2012-04-26 (×4): qty 1000

## 2012-04-26 MED ORDER — SENNA 8.6 MG PO TABS
1.0000 | ORAL_TABLET | Freq: Two times a day (BID) | ORAL | Status: DC
  Administered 2012-04-26 – 2012-04-27 (×3): 8.6 mg via ORAL
  Filled 2012-04-26 (×4): qty 1

## 2012-04-26 MED ORDER — ONDANSETRON HCL 4 MG/2ML IJ SOLN: 4.0000 mg | Freq: Four times a day (QID) | INTRAMUSCULAR | Status: DC | PRN

## 2012-04-26 MED ORDER — MENTHOL 3 MG MT LOZG: 1.0000 | LOZENGE | OROMUCOSAL | Status: DC | PRN

## 2012-04-26 MED ORDER — PHENOL 1.4 % MT LIQD: 1.0000 | OROMUCOSAL | Status: DC | PRN

## 2012-04-26 MED ORDER — HYDROMORPHONE HCL PF 1 MG/ML IJ SOLN
0.2500 mg | INTRAMUSCULAR | Status: DC | PRN
  Administered 2012-04-26 (×2): 0.25 mg via INTRAVENOUS
  Administered 2012-04-26: 0.5 mg via INTRAVENOUS
  Administered 2012-04-26: 0.25 mg via INTRAVENOUS

## 2012-04-26 MED ORDER — TRANEXAMIC ACID 100 MG/ML IV SOLN
15.0000 mg/kg | Freq: Once | INTRAVENOUS | Status: AC
  Administered 2012-04-26: 1116 mg via INTRAVENOUS
  Filled 2012-04-26: qty 11.16

## 2012-04-26 MED ORDER — NEOSTIGMINE METHYLSULFATE 1 MG/ML IJ SOLN
INTRAMUSCULAR | Status: DC | PRN
  Administered 2012-04-26: 3 mg via INTRAVENOUS

## 2012-04-26 MED ORDER — FERROUS SULFATE 325 (65 FE) MG PO TABS
325.0000 mg | ORAL_TABLET | Freq: Three times a day (TID) | ORAL | Status: DC
  Administered 2012-04-26 – 2012-04-28 (×5): 325 mg via ORAL
  Filled 2012-04-26 (×9): qty 1

## 2012-04-26 MED ORDER — HYDROCODONE-ACETAMINOPHEN 5-325 MG PO TABS
1.0000 | ORAL_TABLET | ORAL | Status: DC
  Administered 2012-04-26 (×2): 1 via ORAL
  Administered 2012-04-27 (×2): 2 via ORAL
  Administered 2012-04-27: 1 via ORAL
  Administered 2012-04-27 (×2): 2 via ORAL
  Administered 2012-04-27: 1 via ORAL
  Administered 2012-04-28 (×3): 2 via ORAL
  Filled 2012-04-26 (×2): qty 1
  Filled 2012-04-26 (×4): qty 2
  Filled 2012-04-26: qty 1
  Filled 2012-04-26: qty 2
  Filled 2012-04-26: qty 1
  Filled 2012-04-26 (×2): qty 2

## 2012-04-26 SURGICAL SUPPLY — 44 items
ADH SKN CLS APL DERMABOND .7 (GAUZE/BANDAGES/DRESSINGS) ×1
BAG SPEC THK2 15X12 ZIP CLS (MISCELLANEOUS) ×2
BAG ZIPLOCK 12X15 (MISCELLANEOUS) ×4 IMPLANT
BLADE SAW SGTL 18X1.27X75 (BLADE) ×2 IMPLANT
CELLS DAT CNTRL 66122 CELL SVR (MISCELLANEOUS) ×1 IMPLANT
CLOTH BEACON ORANGE TIMEOUT ST (SAFETY) ×2 IMPLANT
DERMABOND ADVANCED (GAUZE/BANDAGES/DRESSINGS) ×1
DERMABOND ADVANCED .7 DNX12 (GAUZE/BANDAGES/DRESSINGS) ×1 IMPLANT
DRAPE C-ARM 42X72 X-RAY (DRAPES) ×2 IMPLANT
DRAPE STERI IOBAN 125X83 (DRAPES) ×2 IMPLANT
DRAPE U-SHAPE 47X51 STRL (DRAPES) ×6 IMPLANT
DRSG AQUACEL AG ADV 3.5X10 (GAUZE/BANDAGES/DRESSINGS) ×2 IMPLANT
DRSG TEGADERM 4X4.75 (GAUZE/BANDAGES/DRESSINGS) ×1 IMPLANT
DURAPREP 26ML APPLICATOR (WOUND CARE) ×2 IMPLANT
ELECT BLADE TIP CTD 4 INCH (ELECTRODE) ×2 IMPLANT
ELECT REM PT RETURN 9FT ADLT (ELECTROSURGICAL) ×2
ELECTRODE REM PT RTRN 9FT ADLT (ELECTROSURGICAL) ×1 IMPLANT
EVACUATOR 1/8 PVC DRAIN (DRAIN) ×1 IMPLANT
FACESHIELD LNG OPTICON STERILE (SAFETY) ×8 IMPLANT
GAUZE SPONGE 2X2 8PLY STRL LF (GAUZE/BANDAGES/DRESSINGS) ×1 IMPLANT
GLOVE BIOGEL PI IND STRL 7.5 (GLOVE) ×1 IMPLANT
GLOVE BIOGEL PI IND STRL 8 (GLOVE) ×1 IMPLANT
GLOVE BIOGEL PI INDICATOR 7.5 (GLOVE) ×1
GLOVE BIOGEL PI INDICATOR 8 (GLOVE) ×2
GLOVE ECLIPSE 8.0 STRL XLNG CF (GLOVE) ×2 IMPLANT
GLOVE ORTHO TXT STRL SZ7.5 (GLOVE) ×4 IMPLANT
GLOVE SURG SS PI 6.5 STRL IVOR (GLOVE) ×2 IMPLANT
GLOVE SURG SS PI 8.0 STRL IVOR (GLOVE) ×1 IMPLANT
GOWN BRE IMP PREV XXLGXLNG (GOWN DISPOSABLE) ×4 IMPLANT
GOWN STRL NON-REIN LRG LVL3 (GOWN DISPOSABLE) ×3 IMPLANT
KIT BASIN OR (CUSTOM PROCEDURE TRAY) ×2 IMPLANT
PACK TOTAL JOINT (CUSTOM PROCEDURE TRAY) ×2 IMPLANT
PADDING CAST COTTON 6X4 STRL (CAST SUPPLIES) ×2 IMPLANT
RETRACTOR WND ALEXIS 18 MED (MISCELLANEOUS) IMPLANT
RTRCTR WOUND ALEXIS 18CM MED (MISCELLANEOUS) ×2
SPONGE GAUZE 2X2 STER 10/PKG (GAUZE/BANDAGES/DRESSINGS) ×1
SUCTION FRAZIER 12FR DISP (SUCTIONS) ×2 IMPLANT
SUT MNCRL AB 4-0 PS2 18 (SUTURE) ×2 IMPLANT
SUT VIC AB 1 CT1 36 (SUTURE) ×7 IMPLANT
SUT VIC AB 2-0 CT1 27 (SUTURE) ×4
SUT VIC AB 2-0 CT1 TAPERPNT 27 (SUTURE) ×2 IMPLANT
SUT VLOC 180 0 24IN GS25 (SUTURE) ×1 IMPLANT
TOWEL OR 17X26 10 PK STRL BLUE (TOWEL DISPOSABLE) ×4 IMPLANT
TRAY FOLEY CATH 14FRSI W/METER (CATHETERS) ×2 IMPLANT

## 2012-04-26 NOTE — Transfer of Care (Signed)
Immediate Anesthesia Transfer of Care Note  Patient: Tiffany [name]  Procedure(s) Performed: Procedure(s) (LRB) with comments: TOTAL HIP ARTHROPLASTY ANTERIOR APPROACH (Right)  Patient Location: PACU  Anesthesia Type: General  Level of Consciousness: awake, alert , oriented and patient cooperative  Airway & Oxygen Therapy: Patient Spontanous Breathing  Post-op Assessment: Report given to PACU RN, Post -op Vital signs reviewed and stable and Patient moving all extremities  Post vital signs: Reviewed and stable  Complications: No apparent anesthesia complications

## 2012-04-26 NOTE — Progress Notes (Signed)
Utilization review completed.  

## 2012-04-26 NOTE — Anesthesia Procedure Notes (Signed)
Procedure Name: Intubation Date/Time: 04/26/2012 10:13 AM Performed by: Lind Covert Pre-anesthesia Checklist: Patient identified, Timeout performed, Emergency Drugs available, Suction available and Patient being monitored Patient Re-evaluated:Patient Re-evaluated prior to inductionOxygen Delivery Method: Circle system utilized Preoxygenation: Pre-oxygenation with 100% oxygen Intubation Type: IV induction Ventilation: Mask ventilation without difficulty Laryngoscope Size: Mac and 3 Grade View: Grade III Tube type: Oral Tube size: 7.0 mm Number of attempts: 2 Airway Equipment and Method: Bougie stylet Placement Confirmation: ETT inserted through vocal cords under direct vision,  breath sounds checked- equal and bilateral and positive ETCO2 Secured at: 22 cm Tube secured with: Tape Dental Injury: Teeth and Oropharynx as per pre-operative assessment  Difficulty Due To: Difficult Airway- due to anterior larynx

## 2012-04-26 NOTE — Op Note (Signed)
NAME:  Tiffany [name]                ACCOUNT NO.: [id number]      MEDICAL RECORD NO.: [id number]      FACILITY:  [facility]      PHYSICIAN:  Paralee Cancel D  DATE OF BIRTH:  06/19/1946     DATE OF PROCEDURE:  04/26/2012                                 OPERATIVE REPORT         PREOPERATIVE DIAGNOSIS: Right  hip osteoarthritis.      POSTOPERATIVE DIAGNOSIS:  Right hip osteoarthritis.      PROCEDURE:  Right total hip replacement through an anterior approach   utilizing DePuy THR system, component size 57mm pinnacle cup, a size 32+4 neutral   Altrex liner, a size 2Hi Tri Lock stem with a 32+1 delta ceramic   ball.      SURGEON:  Pietro Cassis. Alvan Dame, M.D.      ASSISTANT:  Molli Barrows, PA-C      ANESTHESIA:  General.      SPECIMENS:  None.      COMPLICATIONS:  None.      BLOOD LOSS:  200 cc     DRAINS:  One Hemovac.      INDICATION OF THE PROCEDURE:  Tiffany [name] is a 66 y.o. female who had   presented to office for evaluation of right hip pain.  Radiographs revealed   progressive degenerative changes with bone-on-bone   articulation to the  hip joint.  The patient had painful limited range of   motion significantly affecting their overall quality of life.  The patient was failing to    respond to conservative measures, and at this point was ready   to proceed with more definitive measures.  The patient has noted progressive   degenerative changes in his hip, progressive problems and dysfunction   with regarding the hip prior to surgery.  Consent was obtained for   benefit of pain relief.  Specific risk of infection, DVT, component   failure, dislocation, need for revision surgery, as well discussion of   the anterior versus posterior approach were reviewed.  Consent was   obtained for benefit of anterior pain relief through an anterior   approach.      PROCEDURE IN DETAIL:  The patient was brought to operative theater.   Once adequate anesthesia,  preoperative antibiotics, 2gm Ancef administered.   The patient was positioned supine on the OSI Hanna table.  Once adequate   padding of boney process was carried out, we had predraped out the hip, and  used fluoroscopy to confirm orientation of the pelvis and position.      The right hip was then prepped and draped from proximal iliac crest to   mid thigh with shower curtain technique.      Time-out was performed identifying the patient, planned procedure, and   extremity.     An incision was then made 2 cm distal and lateral to the   anterior superior iliac spine extending over the orientation of the   tensor fascia lata muscle and sharp dissection was carried down to the   fascia of the muscle and protractor placed in the soft tissues.      The fascia was then incised.  The muscle belly was identified and swept  laterally and retractor placed along the superior neck.  Following   cauterization of the circumflex vessels and removing some pericapsular   fat, a second cobra retractor was placed on the inferior neck.  A third   retractor was placed on the anterior acetabulum after elevating the   anterior rectus.  A L-capsulotomy was along the line of the   superior neck to the trochanteric fossa, then extended proximally and   distally.  Tag sutures were placed and the retractors were then placed   intracapsular.  We then identified the trochanteric fossa and   orientation of my neck cut, confirmed this radiographically   and then made a neck osteotomy with the femur on traction.  The femoral   head was removed without difficulty or complication.  Traction was let   off and retractors were placed posterior and anterior around the   acetabulum.      The labrum and foveal tissue were debrided.  I began reaming with a 61mm   reamer and reamed up to 69mm reamer with good bony bed preparation and a 50   cup was chosen.  The final 59mm Pinnacle cup was then impacted under fluoroscopy  to  confirm the depth of penetration and orientation with respect to   abduction.  A screw was placed followed by the hole eliminator.  The final   32+4 neutral Altrex liner was impacted with good visualized rim fit.  The cup was positioned anatomically within the acetabular portion of the pelvis.      At this point, the femur was rolled at 80 degrees.  Further capsule was   released off the inferior aspect of the femoral neck.  I then   released the superior capsule proximally.  The hook was placed laterally   along the femur and elevated manually and held in position with the bed   hook.  The leg was then extended and adducted with the leg rolled to 100   degrees of external rotation.  Once the proximal femur was fully   exposed, I used a box osteotome to set orientation.  I then began   broaching with the starting chili pepper broach and passed this by hand and then broached up to 2.  With the 2 broach in place I chose a high offset neck and did a trial reduction.  With the 32+1 ball the offset was appropriate, leg lengths   appeared to be equal, confirmed radiographically.   Given these findings, I went ahead and dislocated the hip, repositioned all   retractors and positioned the right hip in the extended and abducted position.  The final 2 Hi Tri Lock stem was   chosen and it was impacted down to the level of neck cut.  Based on this   and the trial reduction, a 32+1 delta ceramic ball was chosen and   impacted onto a clean and dry trunnion, and the hip was reduced.  The   hip had been irrigated throughout the case again at this point.  I did   reapproximate the superior capsular leaflet to the anterior leaflet   using #1 Vicryl, placed a medium Hemovac drain deep.  The fascia of the   tensor fascia lata muscle was then reapproximated using #1 Vicryl.  The   remaining wound was closed with 2-0 Vicryl and running 4-0 Monocryl.   The hip was cleaned, dried, and dressed sterilely using  Dermabond and   Aquacel dressing.  Drain site dressed  separately.  She was then brought   to recovery room in stable condition tolerating the procedure well.    Molli Barrows, PA-C was present for the entirety of the case involved from   preoperative positioning, perioperative retractor management, general   facilitation of the case, as well as primary wound closure as assistant.            Pietro Cassis Alvan Dame, M.D.            MDO/MEDQ  D:  05/26/2011  T:  05/26/2011  Job:  [id number]      Electronically Signed by Paralee Cancel M.D. on 06/01/2011 09:15:38 AM

## 2012-04-26 NOTE — Plan of Care (Signed)
Problem: Consults Goal: Diagnosis- Total Joint Replacement Right anterior hip     

## 2012-04-26 NOTE — Anesthesia Preprocedure Evaluation (Addendum)
Anesthesia Evaluation  Patient identified by MRN, date of birth, ID band Patient awake    Reviewed: Allergy & Precautions, H&P , NPO status , Patient's Chart, lab work & pertinent test results  Airway Mallampati: II TM Distance: >3 FB Neck ROM: Full    Dental No notable dental hx.    Pulmonary neg pulmonary ROS,  breath sounds clear to auscultation  Pulmonary exam normal       Cardiovascular hypertension, Pt. on medications Rhythm:Regular Rate:Normal  Reviewed cardiology note from May 2013 with Dr. Percival Spanish.  ECG and Cxr reviewed.   Neuro/Psych Anxiety negative neurological ROS     GI/Hepatic Neg liver ROS, GERD-  ,  Endo/Other  negative endocrine ROS  Renal/GU negative Renal ROS  negative genitourinary   Musculoskeletal negative musculoskeletal ROS (+)   Abdominal (+) + obese,   Peds negative pediatric ROS (+)  Hematology negative hematology ROS (+)   Anesthesia Other Findings   Reproductive/Obstetrics negative OB ROS                          Anesthesia Physical Anesthesia Plan  ASA: II  Anesthesia Plan: General   Post-op Pain Management:    Induction: Intravenous  Airway Management Planned:   Additional Equipment:   Intra-op Plan:   Post-operative Plan: Extubation in OR  Informed Consent: I have reviewed the patients History and Physical, chart, labs and discussed the procedure including the risks, benefits and alternatives for the proposed anesthesia with the patient or authorized representative who has indicated his/her understanding and acceptance.   Dental advisory given  Plan Discussed with: CRNA  Anesthesia Plan Comments: (Discussed r/b general versus spinal. Patient prefers general.)       Anesthesia Quick Evaluation

## 2012-04-26 NOTE — Anesthesia Postprocedure Evaluation (Signed)
  Anesthesia Post-op Note  Patient: Tiffany [name]  Procedure(s) Performed: Procedure(s) (LRB): TOTAL HIP ARTHROPLASTY ANTERIOR APPROACH (Right)  Patient Location: PACU  Anesthesia Type: General  Level of Consciousness: awake and alert   Airway and Oxygen Therapy: Patient Spontanous Breathing  Post-op Pain: mild  Post-op Assessment: Post-op Vital signs reviewed, Patient's Cardiovascular Status Stable, Respiratory Function Stable, Patent Airway and No signs of Nausea or vomiting  Post-op Vital Signs: stable  Complications: No apparent anesthesia complications

## 2012-04-26 NOTE — Preoperative (Signed)
Beta Blockers   Reason not to administer Beta Blockers:Not Applicable 

## 2012-04-26 NOTE — Interval H&P Note (Signed)
History and Physical Interval Note:  04/26/2012 9:01 AM  Tiffany [name]  has presented today for surgery, with the diagnosis of Right Hip Osteoarthritis  The various methods of treatment have been discussed with the patient and family. After consideration of risks, benefits and other options for treatment, the patient has consented to  Procedure(s) (LRB) with comments: [place] (Right) as a surgical intervention .  The patient's history has been reviewed, patient examined, no change in status, stable for surgery.  I have reviewed the patient's chart and labs.  Questions were answered to the patient's satisfaction.     Mauri Pole

## 2012-04-26 NOTE — Progress Notes (Signed)
Patient is hypertensive, paged Rutherford Limerick, PA on call.  Received one time order for HCTZ.  Will give tonight and continue to monitor.

## 2012-04-27 ENCOUNTER — Encounter (HOSPITAL_COMMUNITY): Payer: Self-pay | Admitting: Orthopedic Surgery

## 2012-04-27 DIAGNOSIS — D500 Iron deficiency anemia secondary to blood loss (chronic): Secondary | ICD-10-CM

## 2012-04-27 LAB — BASIC METABOLIC PANEL
BUN: 12 mg/dL (ref 6–23)
CO2: 24 mEq/L (ref 19–32)
Calcium: 8.7 mg/dL (ref 8.4–10.5)
Chloride: 106 mEq/L (ref 96–112)
Creatinine, Ser: 1.08 mg/dL (ref 0.50–1.10)
GFR calc Af Amer: 60 mL/min — ABNORMAL LOW (ref >90)
GFR calc non Af Amer: 52 mL/min — ABNORMAL LOW (ref >90)
Glucose, Bld: 128 mg/dL — ABNORMAL HIGH (ref 70–99)
Potassium: 4.2 mEq/L (ref 3.5–5.1)
Sodium: 138 mEq/L (ref 135–145)

## 2012-04-27 LAB — CBC
HCT: 34.6 % — ABNORMAL LOW (ref 36.0–46.0)
Hemoglobin: 11.9 g/dL — ABNORMAL LOW (ref 12.0–15.0)
MCH: 30.6 pg (ref 26.0–34.0)
MCHC: 34.4 g/dL (ref 30.0–36.0)
MCV: 88.9 fL (ref 78.0–100.0)
Platelets: 155 10*3/uL (ref 150–400)
RBC: 3.89 MIL/uL (ref 3.87–5.11)
RDW: 13.8 % (ref 11.5–15.5)
WBC: 9.4 10*3/uL (ref 4.0–10.5)

## 2012-04-27 NOTE — Progress Notes (Signed)
Physical Therapy Treatment Patient Details Name: Tiffany [name] MRN: [id number] DOB: 1945/08/29 Today's Date: 04/27/2012 Time: 1320-1350 PT Time Calculation (min): 30 min  PT Assessment / Plan / Recommendation Comments on Treatment Session  Good progress with ambulation this afternoon. Pt walked 130' with RW with supervision. Ther ex performed.  Expect will be ready to DC home tomorrow. Will do stair training in morning.      Follow Up Recommendations  Home health PT    Barriers to Discharge None      Equipment Recommendations  None recommended by PT    Recommendations for Other Services OT consult  Frequency 7X/week   Plan Discharge plan remains appropriate;Frequency remains appropriate    Precautions / Restrictions Precautions Precautions: None Precaution Comments: direct anterior hip Restrictions Weight Bearing Restrictions: No Other Position/Activity Restrictions: WBAT   Pertinent Vitals/Pain *5/10 R hip with walking Ice applied, pain meds requested**    Mobility  Bed Mobility Bed Mobility: Sit to Supine Sit to Supine: 4: Min assist;HOB flat Details for Bed Mobility Assistance: min A for RLE Transfers Transfers: Sit to Stand;Stand to Sit Sit to Stand: From chair/3-in-1;With upper extremity assist;4: Min guard Stand to Sit: 5: Supervision;To chair/3-in-1;With upper extremity assist;To bed Details for Transfer Assistance: VCS hand placement and to kick out RLE prior to sitting Ambulation/Gait Ambulation/Gait Assistance: 5: Supervision Ambulation Distance (Feet): 130 Feet Assistive device: Rolling walker Gait Pattern: Step-to pattern;Decreased weight shift to right;Antalgic Gait velocity: decreased General Gait Details: VCs for flexed head    Exercises Total Joint Exercises Ankle Circles/Pumps: AROM;Both;10 reps;Supine Quad Sets: AROM;Right;10 reps;Supine Towel Squeeze: AROM;Both;15 reps;Supine Short Arc Quad: AROM;Right;10 reps;Supine Heel Slides:  AAROM;Right;10 reps;Supine Hip ABduction/ADduction: AAROM;Right;10 reps;Supine Long Arc Quad: AROM;Right;10 reps;Supine   PT Diagnosis: Difficulty walking;Acute pain  PT Problem List: Decreased strength;Decreased activity tolerance;Pain;Decreased mobility PT Treatment Interventions: Therapeutic exercise;Functional mobility training;Stair training;Gait training;DME instruction;Patient/family education   PT Goals Acute Rehab PT Goals PT Goal Formulation: With patient/family Time For Goal Achievement: 04/29/12 Potential to Achieve Goals: Good Pt will go Supine/Side to Sit: with modified independence;with HOB 0 degrees PT Goal: Supine/Side to Sit - Progress: Goal set today Pt will go Sit to Stand: with modified independence PT Goal: Sit to Stand - Progress: Progressing toward goal Pt will Ambulate: 51 - 150 feet;with rolling walker;with modified independence PT Goal: Ambulate - Progress: Progressing toward goal Pt will Go Up / Down Stairs: 1-2 stairs;with min assist;with rolling walker PT Goal: Up/Down Stairs - Progress: Goal set today Pt will Perform Home Exercise Program: Independently PT Goal: Perform Home Exercise Program - Progress: Progressing toward goal  Visit Information  Last PT Received On: 04/27/12 Assistance Needed: +1    Subjective Data  Subjective: It is better than this morning.  Patient Stated Goal: play wiffle ball with grandkids   Cognition  Overall Cognitive Status: Appears within functional limits for tasks assessed/performed Arousal/Alertness: Awake/alert Orientation Level: Appears intact for tasks assessed Behavior During Session: [facility] for tasks performed    Balance     End of Session PT - End of Session Equipment Utilized During Treatment: Gait belt Activity Tolerance: Patient tolerated treatment well Patient left: with call bell/phone within reach;in bed Nurse Communication: Mobility status   GP     Blondell Reveal Kistler 04/27/2012, 1:54  PM [phone]

## 2012-04-27 NOTE — Progress Notes (Signed)
   Subjective: 1 Day Post-Op Procedure(s) (LRB): TOTAL HIP ARTHROPLASTY ANTERIOR APPROACH (Right)   Patient reports pain as mild, pain well controlled. Little HTN throughout the night, other wise the night was uneventful.   Objective:   VITALS:   Filed Vitals:   04/27/12 0800  BP: 16999  Pulse: 90  Temp: 98.6 F (37 C)   Resp: 16    Neurovascular intact Dorsiflexion/Plantar flexion intact Incision: dressing C/D/I No cellulitis present Compartment soft  LABS  Basename 04/27/12 0615  HGB 11.9*  HCT 34.6*  WBC 9.4  PLT 155     Basename 04/27/12 0615  NA 138  K 4.2  BUN 12  CREATININE 1.08  GLUCOSE 128*     Assessment/Plan: 1 Day Post-Op Procedure(s) (LRB): TOTAL HIP ARTHROPLASTY ANTERIOR APPROACH (Right) HV drain d/c'ed Foley cath d/c'ed Advance diet Up with therapy D/C IV fluids Plan for discharge tomorrow to home, if continues to do well.   Expected ABLA  Treated with iron and will observe  Obese (BMI 30-39.9) Estimated Body mass index is 32.03 kg/(m^2) as calculated from the following:   Height as of this encounter: 5\' 0" (1.524 m).   Weight as of this encounter: 164 lb(74.39 kg). Patient also counseled that weight may inhibit the healing process Patient counseled that losing weight will help with future health issues     West Pugh. Ara Grandmaison   PAC  04/27/2012, 9:39 AM

## 2012-04-27 NOTE — Evaluation (Signed)
Physical Therapy Evaluation Patient Details Name: Tiffany [name] MRN: [id number] DOB: Sep 10, 1945 Today's Date: 04/27/2012 Time: [id number] PT Time Calculation (min): 32 min  PT Assessment / Plan / Recommendation Clinical Impression  66 y.o. female POD #1 for R THA, direct anterior approach. Pt ambulated 12' x 2 with RW and min/guard assist. Pain limited activity tolerance. Ther ex performed. Good progress expected. HHPT recommended, no DME needed. Pt would benefit from acute PT to maximize safety and independence with mobility.    PT Assessment  Patient needs continued PT services    Follow Up Recommendations  Home health PT    Barriers to Discharge None      Equipment Recommendations  None recommended by PT    Recommendations for Other Services OT consult   Frequency 7X/week    Precautions / Restrictions Precautions Precautions: None Precaution Comments: direct anterior hip Restrictions Weight Bearing Restrictions: No Other Position/Activity Restrictions: WBAT   Pertinent Vitals/Pain **8/10 R hip with walking Pain meds given just prior to walking, ice applied after PT*      Mobility  Bed Mobility Bed Mobility: Supine to Sit Transfers Transfers: Sit to Stand;Stand to Sit Sit to Stand: 4: Min assist;From chair/3-in-1;From bed;With upper extremity assist Stand to Sit: 5: Supervision;To chair/3-in-1;With upper extremity assist Details for Transfer Assistance: VCS hand placement and to kick out RLE prior to sitting Ambulation/Gait Ambulation/Gait Assistance: 4: Min guard Ambulation Distance (Feet): 24 Feet (12' x 2 to bathroom then to recliner) Assistive device: Rolling walker Gait Pattern: Step-to pattern;Decreased weight shift to right;Antalgic Gait velocity: decreased General Gait Details: distance limited by pain    Exercises Total Joint Exercises Ankle Circles/Pumps: AROM;Both;10 reps;Supine Quad Sets: AROM;Right;10 reps;Supine Heel Slides: AAROM;Right;10  reps;Supine Hip ABduction/ADduction: AAROM;Right;10 reps;Supine Long Arc Quad: AROM;Right;10 reps;Supine   PT Diagnosis: Difficulty walking;Acute pain  PT Problem List: Decreased strength;Decreased activity tolerance;Pain;Decreased mobility PT Treatment Interventions: Therapeutic exercise;Functional mobility training;Stair training;Gait training;DME instruction;Patient/family education   PT Goals Acute Rehab PT Goals PT Goal Formulation: With patient/family Time For Goal Achievement: 04/29/12 Potential to Achieve Goals: Good Pt will go Supine/Side to Sit: with modified independence;with HOB 0 degrees PT Goal: Supine/Side to Sit - Progress: Goal set today Pt will go Sit to Stand: with modified independence PT Goal: Sit to Stand - Progress: Goal set today Pt will Ambulate: 51 - 150 feet;with supervision;with rolling walker PT Goal: Ambulate - Progress: Goal set today Pt will Go Up / Down Stairs: 1-2 stairs;with min assist;with rolling walker PT Goal: Up/Down Stairs - Progress: Goal set today Pt will Perform Home Exercise Program: Independently PT Goal: Perform Home Exercise Program - Progress: Goal set today  Visit Information  Last PT Received On: 04/27/12 Assistance Needed: +1    Subjective Data  Subjective: This hurts! Patient Stated Goal: to play wiffle ball with my grandkids   Prior Functioning  Home Living Lives With: Spouse Available Help at Discharge: Family Type of Home: House Home Access: Stairs to enter [employer] of Steps: 2 Entrance Stairs-Rails: None Home Layout: Two level;Able to live on main level with bedroom/bathroom Alternate Level Stairs-Number of Steps: flight Home Adaptive Equipment: Tub transfer bench;Wheelchair - manual;Walker - rolling;Straight cane;Bedside commode/3-in-1 Prior Function Level of Independence: Independent with assistive device(s) (with cane) Able to Take Stairs?: Yes Driving: Yes Vocation:  Retired Corporate investment banker: No difficulties    Cognition  Overall Cognitive Status: Appears within functional limits for tasks assessed/performed Arousal/Alertness: Awake/alert Orientation Level: Appears intact for tasks assessed Behavior During Session: [facility] for  tasks performed    Extremity/Trunk Assessment Right Upper Extremity Assessment RUE ROM/Strength/Tone: [facility] for tasks assessed Left Upper Extremity Assessment LUE ROM/Strength/Tone: WFL for tasks assessed Right Lower Extremity Assessment RLE ROM/Strength/Tone: Deficits RLE ROM/Strength/Tone Deficits: knee ext 3/5, hip 2/5 limited by pain, ankle 5/5; AAROM hip WFL RLE Sensation: WFL - Light Touch RLE Coordination: WFL - gross/fine motor Left Lower Extremity Assessment LLE ROM/Strength/Tone: Within functional levels LLE Sensation: WFL - Light Touch LLE Coordination: WFL - gross/fine motor Trunk Assessment Trunk Assessment: Normal   Balance    End of Session PT - End of Session Equipment Utilized During Treatment: Gait belt Activity Tolerance: Patient tolerated treatment well Patient left: in chair;with call bell/phone within reach;with family/visitor present Nurse Communication: Mobility status  GP     Blondell Reveal Kistler 04/27/2012, 11:13 AM  [phone]

## 2012-04-28 LAB — BASIC METABOLIC PANEL
BUN: 11 mg/dL (ref 6–23)
CO2: 26 mEq/L (ref 19–32)
Calcium: 8.6 mg/dL (ref 8.4–10.5)
Chloride: 103 mEq/L (ref 96–112)
Creatinine, Ser: 1 mg/dL (ref 0.50–1.10)
GFR calc Af Amer: 66 mL/min — ABNORMAL LOW (ref >90)
GFR calc non Af Amer: 57 mL/min — ABNORMAL LOW (ref >90)
Glucose, Bld: 112 mg/dL — ABNORMAL HIGH (ref 70–99)
Potassium: 3.4 mEq/L — ABNORMAL LOW (ref 3.5–5.1)
Sodium: 137 mEq/L (ref 135–145)

## 2012-04-28 LAB — CBC
HCT: 35.2 % — ABNORMAL LOW (ref 36.0–46.0)
Hemoglobin: 11.7 g/dL — ABNORMAL LOW (ref 12.0–15.0)
MCH: 29.8 pg (ref 26.0–34.0)
MCHC: 33.2 g/dL (ref 30.0–36.0)
MCV: 89.8 fL (ref 78.0–100.0)
Platelets: 163 10*3/uL (ref 150–400)
RBC: 3.92 MIL/uL (ref 3.87–5.11)
RDW: 14 % (ref 11.5–15.5)
WBC: 9.4 10*3/uL (ref 4.0–10.5)

## 2012-04-28 MED ORDER — DSS 100 MG PO CAPS: 100.0000 mg | ORAL_CAPSULE | Freq: Two times a day (BID) | ORAL | Status: DC

## 2012-04-28 MED ORDER — METHOCARBAMOL 500 MG PO TABS: 500.0000 mg | ORAL_TABLET | Freq: Four times a day (QID) | ORAL | Status: DC | PRN

## 2012-04-28 MED ORDER — ASPIRIN EC 325 MG PO TBEC: 325.0000 mg | DELAYED_RELEASE_TABLET | Freq: Two times a day (BID) | ORAL | Status: DC

## 2012-04-28 MED ORDER — FERROUS SULFATE 325 (65 FE) MG PO TABS: 325.0000 mg | ORAL_TABLET | Freq: Three times a day (TID) | ORAL | Status: DC

## 2012-04-28 MED ORDER — HYDROCODONE-ACETAMINOPHEN 5-325 MG PO TABS: 1.0000 | ORAL_TABLET | ORAL | Status: DC | PRN

## 2012-04-28 MED ORDER — POLYETHYLENE GLYCOL 3350 17 G PO PACK: 17.0000 g | PACK | Freq: Every day | ORAL | Status: DC | PRN

## 2012-04-28 NOTE — Care Management Note (Signed)
    Page 1 of 2   04/28/2012     10:58:31 AM   CARE MANAGEMENT NOTE 04/28/2012  Patient:  Tiffany [name], Tiffany [name]   Account Number:  [id number]  Date Initiated:  04/28/2012  Documentation initiated by:  Sherrin Daisy  Subjective/Objective Assessment:   dx rt hip osteoarthritis; total hip replacemnt-anterior approach     Action/Plan:   CM spoke with patient. Plans are for patient to return to her home where spouse will be caregiver. Pt already has DME. Plans to use [facility] for [facility] services   Anticipated DC Date:  04/28/2012   Anticipated DC Plan:  [place] referral  NA      DC Planning Services  CM consult      [facility] Choice  HOME HEALTH   Choice offered to / List presented to:  C-1 Patient   DME arranged  NA      DME agency  NA     [place] arranged  HH-2 PT      [place]   Status of service:  Completed, signed off Medicare Important Message given?  NA - LOS <3 / Initial given by admissions (If response is "NO", the following Medicare IM given date fields will be blank) Date Medicare IM given:   Date Additional Medicare IM given:    Discharge Disposition:  [place]  Per UR Regulation:    If discussed at Long Length of Stay Meetings, dates discussed:    Comments:  04/28/2012 [place] [phone] Anticipate d/c today. [facility] HHPT services will  start tomorrow 04/29/2012. No dme needs

## 2012-04-28 NOTE — Progress Notes (Signed)
   Subjective: 2 Days Post-Op Procedure(s) (LRB): TOTAL HIP ARTHROPLASTY ANTERIOR APPROACH (Right)   Patient reports pain as mild, pain well controlled. No events throughout the night. Ready to be discharged home.  Objective:   VITALS:   Filed Vitals:   04/28/12 0600  BP: 134/84  Pulse: 78  Temp: 97.8 F (36.6 C)  Resp: 16    Neurovascular intact Dorsiflexion/Plantar flexion intact Incision: dressing C/D/I No cellulitis present Compartment soft  LABS  Basename 04/28/12 0349 04/27/12 0615  HGB 11.7* 11.9*  HCT 35.2* 34.6*  WBC 9.4 9.4  PLT 163 155     Basename 04/28/12 0349 04/27/12 0615  NA 137 138  K 3.4* 4.2  BUN 11 12  CREATININE 1.00 1.08  GLUCOSE 112* 128*     Assessment/Plan: 2 Days Post-Op Procedure(s) (LRB): TOTAL HIP ARTHROPLASTY ANTERIOR APPROACH (Right) Up with therapy Discharge home with home health Follow up in 2 weeks at [facility].  Follow-up Information    Follow up with OLIN,Naamah Boggess D in 2 weeks.   Contact information:   [facility] [address], [place] [phone]           Expected ABLA  Treated with iron and will observe  Obese (BMI 30-39.9)  Estimated Body mass index is 32.03 kg/(m^2) as calculated from the following:   Height as of this encounter: 5\' 0" (1.524 m).   Weight as of this encounter: 164 lb(74.39 kg). Patient also counseled that weight may inhibit the healing process Patient counseled that losing weight will help with future health issues  Hypokalemia Will observe at this time.     West Pugh Henchy Mccauley   PAC  04/28/2012, 9:01 AM

## 2012-04-28 NOTE — Discharge Summary (Signed)
Physician Discharge Summary  Patient ID: Tiffany [name] MRN: [id number] DOB/AGE: 1946/05/17 66 y.o.  Admit date: 04/26/2012 Discharge date:  04/28/2012  Procedures:  Procedure(s) (LRB): TOTAL HIP ARTHROPLASTY ANTERIOR APPROACH (Right)  Attending Physician:  Dr. Paralee Cancel   Admission Diagnoses:   Right hip OA and pain   Discharge Diagnoses:  Principal Problem:  *S/P right THA, AA Active Problems:  Expected blood loss anemia  Hypokalemia Obesity, unspecified   URINARY INCONTINENCE   SYNCOPE, HX OF   BENIGN POSITIONAL VERTIGO   GERD   DYSLIPIDEMIA   HYPERTENSION   Raynaud disease    HPI: Pt is a 66 y.o. female complaining of right hip pain for at least 4 years. Pain had continually increased since the beginning. X-rays in the clinic show end-stage arthritic changes of the right hip. Pt has tried various conservative treatments which have failed to alleviate their symptoms, including a intra-articular steroid injection that helped for approximately 8 weeks. Various options are discussed with the patient. Risks, benefits and expectations were discussed with the patient. Patient understand the risks, benefits and expectations and wishes to proceed with surgery.   PCP: Gwendolyn Grant, MD   Discharged Condition: good  Hospital Course:  Patient underwent the above stated procedure on 04/26/2012. Patient tolerated the procedure well and brought to the recovery room in good condition and subsequently to the floor.  POD #1 BP: 169/99 ; Pulse: 90 ; Temp: 98.6 F (37 C) ; Resp: 16  Pt's foley was removed, as well as the hemovac drain removed. IV was changed to a saline lock. Patient reports pain as mild, pain well controlled. Little HTN throughout the night, other wise the night was uneventful. Neurovascular intact, dorsiflexion/plantar flexion intact, incision: dressing C/D/I, no cellulitis present and compartment soft.   LABS  Basename  04/27/12 0615   HGB  11.9  HCT  34.6     POD #2  BP: 134/84 ; Pulse: 78 ; Temp: 97.8 F (36.6 C) ; Resp: 16 Patient reports pain as mild, pain well controlled. No events throughout the night. Ready to be discharged home. Neurovascular intact, dorsiflexion/plantar flexion intact, incision: dressing C/D/I, no cellulitis present and compartment soft.   LABS  Basename  04/28/12 0349   HGB  11.7  HCT  35.2    Discharge Exam: General appearance: alert, cooperative and no distress Extremities: Homans sign is negative, no sign of DVT, no edema, redness or tenderness in the calves or thighs and no ulcers, gangrene or trophic changes  Disposition: Home with follow up in 2 weeks   Follow-up Information    Follow up with Mauri Pole, MD. In 2 weeks.   Contact information:   [facility] [place], [place] [address] [id number]          Discharge Orders    Future Appointments: Provider: Department: Dept Phone: Center:   09/15/2012 10:30 AM Rowe Clack, MD [facility] [phone] [facility]     Future Orders Please Complete By Expires   Diet - low sodium heart healthy      Call MD / Call 911      Comments:   If you experience chest pain or shortness of breath, CALL 911 and be transported to the hospital emergency room.  If you develope a fever above 101 F, pus (white drainage) or increased drainage or redness at the wound, or calf pain, call your surgeon's office.   Discharge instructions      Comments:  Maintain surgical dressing for 8 days, then replace with gauze and tape. Keep the area dry and clean until follow up. Follow up in 2 weeks at [facility]. Call with any questions or concerns.   Constipation Prevention      Comments:   Drink plenty of fluids.  Prune juice may be helpful.  You may use a stool softener, such as Colace (over the counter) 100 mg twice a day.  Use MiraLax (over the counter) for constipation as needed.   Increase activity slowly as  tolerated      Change dressing      Comments:   Maintain surgical dressing for 8 days, then replace with 4x4 guaze and tape. Keep the area dry and clean.   TED hose      Comments:   Use stockings (TED hose) for 2 weeks on both leg(s).  You may remove them at night for sleeping.      Current Discharge Medication List    START taking these medications   Details  aspirin EC 325 MG tablet Take 1 tablet (325 mg total) by mouth 2 (two) times daily. X 4 weeks Qty: 60 tablet, Refills: 0    docusate sodium 100 MG CAPS Take 100 mg by mouth 2 (two) times daily. Qty: 10 capsule    ferrous sulfate 325 (65 FE) MG tablet Take 1 tablet (325 mg total) by mouth 3 (three) times daily after meals.    HYDROcodone-acetaminophen (NORCO/VICODIN) 5-325 MG per tablet Take 1-2 tablets by mouth every 4 (four) hours as needed for pain. Qty: 120 tablet, Refills: 0    methocarbamol (ROBAXIN) 500 MG tablet Take 1 tablet (500 mg total) by mouth every 6 (six) hours as needed (muscle spasms).    polyethylene glycol (MIRALAX / GLYCOLAX) packet Take 17 g by mouth daily as needed. Qty: 14 each      CONTINUE these medications which have NOT CHANGED   Details  diazepam (VALIUM) 5 MG tablet Take 1 tablet (5 mg total) by mouth every 8 (eight) hours as needed for anxiety. Qty: 90 tablet, Refills: 1    estradiol (ESTRACE) 0.1 MG/GM vaginal cream Place 1 g vaginally once a week.      hydrochlorothiazide (MICROZIDE) 12.5 MG capsule Take 1 capsule (12.5 mg total) by mouth daily. Qty: 30 capsule, Refills: 6   Associated Diagnoses: Essential hypertension, benign    calcium carbonate (OS-CAL) 600 MG TABS Take 600 mg by mouth daily.      Cholecalciferol (VITAMIN D) 2000 UNITS CAPS Take by mouth 3 (three) times a week.      Multiple Vitamins-Minerals (CENTRUM SILVER) tablet Take 1 tablet by mouth daily.     Omega-3 Fatty Acids (FISH OIL) 1200 MG CAPS Take 2 each by mouth daily.       STOP taking these medications       traMADol (ULTRAM) 50 MG tablet Comments:  Reason for Stopping:           Signed: West Pugh. Mayford Alberg   PAC  04/28/2012, 9:28 AM

## 2012-04-28 NOTE — Progress Notes (Signed)
Physical Therapy Treatment Patient Details Name: Tiffany [name] MRN: [id number] DOB: May 01, 1946 Today's Date: 04/28/2012 Time: 0900-0930 PT Time Calculation (min): 30 min  PT Assessment / Plan / Recommendation Comments on Treatment Session  Stair training completed, pt ambulated 325' then became nauseous and had small amount of emesis, nausea then resolved. VSS. Will review HEP later this morning. Doing very well with mobility. REady to DC home today from PT standpoint.     Follow Up Recommendations  Home health PT    Barriers to Discharge        Equipment Recommendations  None recommended by PT    Recommendations for Other Services OT consult  Frequency 7X/week   Plan Discharge plan remains appropriate;Frequency remains appropriate    Precautions / Restrictions Precautions Precautions: None Precaution Comments: direct anterior hip Restrictions Weight Bearing Restrictions: No Other Position/Activity Restrictions: WBAT   Pertinent Vitals/Pain *6/10 R hip  Ice applied**    Mobility  Bed Mobility Bed Mobility: Sit to Supine;Not assessed Details for Bed Mobility Assistance: min A for RLE Transfers Transfers: Sit to Stand;Stand to Sit Sit to Stand: From chair/3-in-1;With upper extremity assist;5: Supervision Stand to Sit: 5: Supervision;To chair/3-in-1;With upper extremity assist Details for Transfer Assistance: VCS hand placement and to kick out RLE prior to sitting Ambulation/Gait Ambulation/Gait Assistance: 5: Supervision Ambulation Distance (Feet): 325 Feet Assistive device: Rolling walker Gait Pattern: Step-to pattern Gait velocity: decreased General Gait Details: VCs for flexed head and for heel strike. Pt became nauseous and had small amount of emesis after walking 325'.  VSS. Nausea resolved immediately after vomiting. RN aware. Stairs: Yes Stairs Assistance: 4: Min assist Stairs Assistance Details (indicate cue type and reason): min A to steady RW Stair  Management Technique: No rails;Backwards;With walker;Step to pattern Number of Stairs: 4     Exercises     PT Diagnosis:    PT Problem List:   PT Treatment Interventions:     PT Goals Acute Rehab PT Goals PT Goal Formulation: With patient/family Time For Goal Achievement: 04/29/12 Potential to Achieve Goals: Good Pt will go Supine/Side to Sit: with modified independence;with HOB 0 degrees Pt will go Sit to Stand: with modified independence PT Goal: Sit to Stand - Progress: Met Pt will Ambulate: 51 - 150 feet;with rolling walker;with modified independence PT Goal: Ambulate - Progress: Progressing toward goal Pt will Go Up / Down Stairs: 1-2 stairs;with min assist;with rolling walker PT Goal: Up/Down Stairs - Progress: Met Pt will Perform Home Exercise Program: Independently  Visit Information  Last PT Received On: 04/28/12 Assistance Needed: +1    Subjective Data  Subjective: Everything feels better today. Patient Stated Goal: play with grandkids   Cognition  Overall Cognitive Status: Appears within functional limits for tasks assessed/performed Arousal/Alertness: Awake/alert Orientation Level: Appears intact for tasks assessed Behavior During Session: [facility] for tasks performed    Balance     End of Session PT - End of Session Equipment Utilized During Treatment: Gait belt Activity Tolerance: Patient tolerated treatment well Patient left: with call bell/phone within reach;in chair Nurse Communication: Mobility status   GP     Blondell Reveal Kistler 04/28/2012, 9:41 AM [phone]

## 2012-04-28 NOTE — Progress Notes (Signed)
Occupational Therapy Note Chart reviewed. Spoke to pt and she has all DME and assist PRN at discharge. She states she doesn't feel she needs to practice anything with ADL prior to discharge today. She feels comfortable with all tasks. Will sign off. Pauline Aus OTR/L [id number] 04/28/2012

## 2012-04-29 DIAGNOSIS — M169 Osteoarthritis of hip, unspecified: Secondary | ICD-10-CM | POA: Diagnosis not present

## 2012-04-29 DIAGNOSIS — D62 Acute posthemorrhagic anemia: Secondary | ICD-10-CM | POA: Diagnosis not present

## 2012-04-29 DIAGNOSIS — IMO0001 Reserved for inherently not codable concepts without codable children: Secondary | ICD-10-CM | POA: Diagnosis not present

## 2012-04-29 DIAGNOSIS — Z471 Aftercare following joint replacement surgery: Secondary | ICD-10-CM | POA: Diagnosis not present

## 2012-04-29 DIAGNOSIS — Z96649 Presence of unspecified artificial hip joint: Secondary | ICD-10-CM | POA: Diagnosis not present

## 2012-05-02 DIAGNOSIS — Z96649 Presence of unspecified artificial hip joint: Secondary | ICD-10-CM | POA: Diagnosis not present

## 2012-05-02 DIAGNOSIS — Z471 Aftercare following joint replacement surgery: Secondary | ICD-10-CM | POA: Diagnosis not present

## 2012-05-02 DIAGNOSIS — M169 Osteoarthritis of hip, unspecified: Secondary | ICD-10-CM | POA: Diagnosis not present

## 2012-05-02 DIAGNOSIS — D62 Acute posthemorrhagic anemia: Secondary | ICD-10-CM | POA: Diagnosis not present

## 2012-05-02 DIAGNOSIS — IMO0001 Reserved for inherently not codable concepts without codable children: Secondary | ICD-10-CM | POA: Diagnosis not present

## 2012-05-04 DIAGNOSIS — D62 Acute posthemorrhagic anemia: Secondary | ICD-10-CM | POA: Diagnosis not present

## 2012-05-04 DIAGNOSIS — IMO0001 Reserved for inherently not codable concepts without codable children: Secondary | ICD-10-CM | POA: Diagnosis not present

## 2012-05-04 DIAGNOSIS — Z471 Aftercare following joint replacement surgery: Secondary | ICD-10-CM | POA: Diagnosis not present

## 2012-05-04 DIAGNOSIS — M169 Osteoarthritis of hip, unspecified: Secondary | ICD-10-CM | POA: Diagnosis not present

## 2012-05-04 DIAGNOSIS — Z23 Encounter for immunization: Secondary | ICD-10-CM | POA: Diagnosis not present

## 2012-05-04 DIAGNOSIS — Z96649 Presence of unspecified artificial hip joint: Secondary | ICD-10-CM | POA: Diagnosis not present

## 2012-05-06 DIAGNOSIS — M169 Osteoarthritis of hip, unspecified: Secondary | ICD-10-CM | POA: Diagnosis not present

## 2012-05-06 DIAGNOSIS — D62 Acute posthemorrhagic anemia: Secondary | ICD-10-CM | POA: Diagnosis not present

## 2012-05-06 DIAGNOSIS — Z471 Aftercare following joint replacement surgery: Secondary | ICD-10-CM | POA: Diagnosis not present

## 2012-05-06 DIAGNOSIS — Z96649 Presence of unspecified artificial hip joint: Secondary | ICD-10-CM | POA: Diagnosis not present

## 2012-05-06 DIAGNOSIS — IMO0001 Reserved for inherently not codable concepts without codable children: Secondary | ICD-10-CM | POA: Diagnosis not present

## 2012-05-09 DIAGNOSIS — IMO0001 Reserved for inherently not codable concepts without codable children: Secondary | ICD-10-CM | POA: Diagnosis not present

## 2012-05-09 DIAGNOSIS — M169 Osteoarthritis of hip, unspecified: Secondary | ICD-10-CM | POA: Diagnosis not present

## 2012-05-09 DIAGNOSIS — Z471 Aftercare following joint replacement surgery: Secondary | ICD-10-CM | POA: Diagnosis not present

## 2012-05-09 DIAGNOSIS — D62 Acute posthemorrhagic anemia: Secondary | ICD-10-CM | POA: Diagnosis not present

## 2012-05-09 DIAGNOSIS — Z96649 Presence of unspecified artificial hip joint: Secondary | ICD-10-CM | POA: Diagnosis not present

## 2012-05-11 DIAGNOSIS — IMO0001 Reserved for inherently not codable concepts without codable children: Secondary | ICD-10-CM | POA: Diagnosis not present

## 2012-05-11 DIAGNOSIS — Z471 Aftercare following joint replacement surgery: Secondary | ICD-10-CM | POA: Diagnosis not present

## 2012-05-11 DIAGNOSIS — D62 Acute posthemorrhagic anemia: Secondary | ICD-10-CM | POA: Diagnosis not present

## 2012-05-11 DIAGNOSIS — Z96649 Presence of unspecified artificial hip joint: Secondary | ICD-10-CM | POA: Diagnosis not present

## 2012-05-11 DIAGNOSIS — M169 Osteoarthritis of hip, unspecified: Secondary | ICD-10-CM | POA: Diagnosis not present

## 2012-05-13 DIAGNOSIS — Z471 Aftercare following joint replacement surgery: Secondary | ICD-10-CM | POA: Diagnosis not present

## 2012-05-13 DIAGNOSIS — D62 Acute posthemorrhagic anemia: Secondary | ICD-10-CM | POA: Diagnosis not present

## 2012-05-13 DIAGNOSIS — IMO0001 Reserved for inherently not codable concepts without codable children: Secondary | ICD-10-CM | POA: Diagnosis not present

## 2012-05-13 DIAGNOSIS — M169 Osteoarthritis of hip, unspecified: Secondary | ICD-10-CM | POA: Diagnosis not present

## 2012-05-13 DIAGNOSIS — Z96649 Presence of unspecified artificial hip joint: Secondary | ICD-10-CM | POA: Diagnosis not present

## 2012-06-09 DIAGNOSIS — M169 Osteoarthritis of hip, unspecified: Secondary | ICD-10-CM | POA: Diagnosis not present

## 2012-07-06 ENCOUNTER — Other Ambulatory Visit: Payer: Self-pay | Admitting: Internal Medicine

## 2012-07-20 DIAGNOSIS — Z4789 Encounter for other orthopedic aftercare: Secondary | ICD-10-CM | POA: Diagnosis not present

## 2012-09-02 DIAGNOSIS — Z1231 Encounter for screening mammogram for malignant neoplasm of breast: Secondary | ICD-10-CM | POA: Diagnosis not present

## 2012-09-02 LAB — HM MAMMOGRAPHY: HM Mammogram: NEGATIVE

## 2012-09-06 ENCOUNTER — Encounter: Payer: Self-pay | Admitting: Internal Medicine

## 2012-09-15 ENCOUNTER — Ambulatory Visit: Payer: Medicare Other | Admitting: Internal Medicine

## 2012-09-19 ENCOUNTER — Encounter: Payer: Self-pay | Admitting: Internal Medicine

## 2012-09-19 ENCOUNTER — Ambulatory Visit (INDEPENDENT_AMBULATORY_CARE_PROVIDER_SITE_OTHER): Payer: Medicare Other | Admitting: Internal Medicine

## 2012-09-19 VITALS — BP 142/100 | HR 102 | Temp 98.1°F | Ht 60.0 in | Wt 164.0 lb

## 2012-09-19 DIAGNOSIS — I10 Essential (primary) hypertension: Secondary | ICD-10-CM | POA: Diagnosis not present

## 2012-09-19 DIAGNOSIS — E785 Hyperlipidemia, unspecified: Secondary | ICD-10-CM | POA: Diagnosis not present

## 2012-09-19 DIAGNOSIS — M169 Osteoarthritis of hip, unspecified: Secondary | ICD-10-CM

## 2012-09-19 DIAGNOSIS — M1611 Unilateral primary osteoarthritis, right hip: Secondary | ICD-10-CM

## 2012-09-19 MED ORDER — EPINEPHRINE 0.3 MG/0.3ML IJ DEVI: 0.3000 mg | Freq: Once | INTRAMUSCULAR | Status: DC

## 2012-09-19 NOTE — Progress Notes (Signed)
Subjective:    Patient ID: Tiffany [name], female    DOB: [birth year], 67 y.o.   MRN: [id number]  HPI  Here for follow up - reviewed chronic medical issues:  HTN, uncontrolled -?white coat as pt report good blood pressure control at home - intol of multiple meds: lisinopril+diuretic, benicar, diovan, benicar, norvasc, atenolol - prior eval by cards 10/2011 and 12/2011 for same - uncontrolled on 24h bp monitor 10/2011 and 12/2011 x 21d -currently on prn diuretic and feels well - no chest pain, palpitations or dizziness at this time, but feels "terrible" if taking hctz regualrly - home BP log reviewed - range SBP 110-140s - declines other medicine tx  dyslipidemia - intol of many meds  - muscle cramps on simvastin, niaspan, welchol - still feels thighs are weak due to prior tx - improved with PT. taking fish oil w/o problems - reports "good ratio" and does not feel need for other meds  GERD - no active symptoms at this time -  no hx ulcers - takes OTC meds only as needed   CKD hx - [place] reports renal fx has improved so no longer following there  Requests EpiPen for upcoming travel to [place] - will be with group  Past Medical History  Diagnosis Date  . Obesity, unspecified   . URINARY INCONTINENCE 04-20-12    occ. with nighttime sleep pattern  . SYNCOPE, HX OF   . BENIGN POSITIONAL VERTIGO   . GERD   . DYSLIPIDEMIA   . Raynaud disease   . Complication of anesthesia 04-20-12    some issues with prolonged sedation after anesthesia  . Arthritis 04-20-12    Osteoarthritis-right hip  . HYPERTENSION     severe, pt intol of med tx, Pt. has severe "whitecoat" syndrome    Review of Systems  Constitutional: Negative for unexpected weight change.  Respiratory: Negative for shortness of breath.   Cardiovascular: Negative for chest pain, palpitations and leg swelling.  Neurological: Negative for headaches.       Objective:   Physical Exam  BP 142/100   Pulse 102  Temp(Src) 98.1 F (36.7 C) (Oral)  Ht 5' (1.524 m)  Wt 164 lb (74.39 kg)  BMI 32.03 kg/m2  SpO2 94%   Wt Readings from Last 3 Encounters:  09/19/12 164 lb (74.39 kg)  04/26/12 164 lb (74.39 kg)  04/26/12 164 lb (74.39 kg)   Constitutional: She is overweight; appears well-developed and well-nourished. No distress. Neck: Normal range of motion. Neck supple. No JVD present. No thyromegaly present.  Cardiovascular: Normal rate, regular rhythm and normal heart sounds.  No murmur heard. No BLE edema. Pulmonary/Chest: Effort normal and breath sounds normal. No respiratory distress. She has no wheezes.    Lab Results  Component Value Date   WBC 9.4 04/28/2012   HGB 11.7* 04/28/2012   HCT 35.2* 04/28/2012   PLT 163 04/28/2012   CHOL 211 02/11/2009   HDL 64 02/11/2009   ALT 15 10/01/2011   AST 18 10/01/2011   NA 137 04/28/2012   K 3.4* 04/28/2012   CL 103 04/28/2012   CREATININE 1.00 04/28/2012   BUN 11 04/28/2012   CO2 26 04/28/2012   TSH 2.06 10/01/2011   INR 0.98 04/20/2012       Assessment & Plan:   see problem list. Medications and labs reviewed today.  [place] for EpiPen to use during travel as needed  Time spent with pt today 25  minutes, greater than 50% time spent counseling patient on interval hx medication review. Also review of prior records

## 2012-09-19 NOTE — Patient Instructions (Signed)
It was good to see you today. We have reviewed your prior records including labs and tests today Medications reviewed, no changes at this time.  [place] for EpiPen if needed during your travels - Your prescription(s) have been submitted to your pharmacy. Please take as directed and contact our office if you believe you are having problem(s) with the medication(s). Please schedule followup in 6-12 months for annual wellness visit, call sooner if problems.

## 2012-09-19 NOTE — Assessment & Plan Note (Signed)
Severe "white coat" per pt - home BP under improved control with prn hctz only Prev tx with intol of multiple meds - lisinopril+diuretic, benicar, diovan, benicar, norvasc, atenolol - currently on no treatment and feels well - no chest pain, edema or dizziness - home BP log reviewed - Pt declines any rx tx due to these factors - advised to call if home BP readings consistently >140s/90s and pt agrees s/p 24h monitor BP from cards 10/2011 and 21d BP monitor 11/2011  S/p cards eval spring 2013 - no evidence of cardiac abnormality working on control of anxiety to manage same  BP Readings from Last 3 Encounters:  09/19/12 142/100  04/28/12 128/88  04/28/12 128/88

## 2012-09-19 NOTE — Assessment & Plan Note (Signed)
pt intol of many meds  - muscle cramps on prior tx simvastin, niaspan, welchol reviewed taking fish oil without problems, remains active - reports "good ratio" and does not feel need for med tx

## 2012-09-19 NOTE — Assessment & Plan Note (Signed)
S/p R THA 04/2012 - doing well Ongoing home PT and improved activity with less pain S/p spine ortho eval 2011 - felt to NOT be lumbar in etiology as improved with R IA hip injection 06/2010

## 2012-11-10 DIAGNOSIS — H6120 Impacted cerumen, unspecified ear: Secondary | ICD-10-CM | POA: Diagnosis not present

## 2012-11-10 DIAGNOSIS — J309 Allergic rhinitis, unspecified: Secondary | ICD-10-CM | POA: Diagnosis not present

## 2013-04-24 ENCOUNTER — Encounter: Payer: Self-pay | Admitting: Internal Medicine

## 2013-04-28 DIAGNOSIS — M169 Osteoarthritis of hip, unspecified: Secondary | ICD-10-CM | POA: Diagnosis not present

## 2013-05-25 DIAGNOSIS — Z23 Encounter for immunization: Secondary | ICD-10-CM | POA: Diagnosis not present

## 2013-05-26 ENCOUNTER — Encounter: Payer: Self-pay | Admitting: Internal Medicine

## 2013-07-06 DIAGNOSIS — H6520 Chronic serous otitis media, unspecified ear: Secondary | ICD-10-CM | POA: Diagnosis not present

## 2013-07-28 ENCOUNTER — Telehealth: Payer: Self-pay | Admitting: *Deleted

## 2013-07-28 NOTE — Telephone Encounter (Signed)
Tamiflu prophylaxis advised only if exposure is household contact (ie lives with someone who is sick with flu). Call if/when symptoms begin - ideal to start Tamiflu within 24h of symptoms onset -  thanks

## 2013-07-28 NOTE — Telephone Encounter (Signed)
Pt called states she was exposed to the Flu on yesterday.  Pt is requesting whether she should be prescribed Tamiflu now or wait til symptoms occur.  Please advise

## 2013-07-28 NOTE — Telephone Encounter (Signed)
Spoke with pt advised of MDs message 

## 2013-09-05 ENCOUNTER — Encounter: Payer: Self-pay | Admitting: Internal Medicine

## 2013-09-05 DIAGNOSIS — Z1231 Encounter for screening mammogram for malignant neoplasm of breast: Secondary | ICD-10-CM | POA: Diagnosis not present

## 2013-09-05 LAB — HM MAMMOGRAPHY

## 2013-09-12 DIAGNOSIS — H2510 Age-related nuclear cataract, unspecified eye: Secondary | ICD-10-CM | POA: Diagnosis not present

## 2013-09-13 ENCOUNTER — Encounter: Payer: Self-pay | Admitting: Internal Medicine

## 2013-09-13 DIAGNOSIS — N6009 Solitary cyst of unspecified breast: Secondary | ICD-10-CM | POA: Diagnosis not present

## 2013-09-13 DIAGNOSIS — R928 Other abnormal and inconclusive findings on diagnostic imaging of breast: Secondary | ICD-10-CM | POA: Diagnosis not present

## 2013-09-13 HISTORY — PX: OTHER SURGICAL HISTORY: SHX169

## 2013-09-13 LAB — HM MAMMOGRAPHY

## 2013-09-20 ENCOUNTER — Encounter: Payer: Medicare Other | Admitting: Internal Medicine

## 2013-09-20 ENCOUNTER — Ambulatory Visit (INDEPENDENT_AMBULATORY_CARE_PROVIDER_SITE_OTHER): Payer: Medicare Other | Admitting: Internal Medicine

## 2013-09-20 ENCOUNTER — Encounter: Payer: Self-pay | Admitting: Internal Medicine

## 2013-09-20 ENCOUNTER — Ambulatory Visit (INDEPENDENT_AMBULATORY_CARE_PROVIDER_SITE_OTHER)
Admission: RE | Admit: 2013-09-20 | Discharge: 2013-09-20 | Disposition: A | Discharge status: Home / Self Care | Payer: Medicare Other | Source: Ambulatory Visit | Attending: Internal Medicine | Admitting: Internal Medicine

## 2013-09-20 VITALS — BP 142/90 | HR 88 | Temp 97.2°F | Ht 60.0 in | Wt 164.1 lb

## 2013-09-20 DIAGNOSIS — Z0000 Encounter for general adult medical examination without abnormal findings: Secondary | ICD-10-CM

## 2013-09-20 DIAGNOSIS — B379 Candidiasis, unspecified: Secondary | ICD-10-CM | POA: Diagnosis not present

## 2013-09-20 DIAGNOSIS — I10 Essential (primary) hypertension: Secondary | ICD-10-CM | POA: Diagnosis not present

## 2013-09-20 DIAGNOSIS — M8580 Other specified disorders of bone density and structure, unspecified site: Secondary | ICD-10-CM

## 2013-09-20 DIAGNOSIS — M899 Disorder of bone, unspecified: Secondary | ICD-10-CM

## 2013-09-20 DIAGNOSIS — M949 Disorder of cartilage, unspecified: Secondary | ICD-10-CM | POA: Diagnosis not present

## 2013-09-20 DIAGNOSIS — Z23 Encounter for immunization: Secondary | ICD-10-CM | POA: Diagnosis not present

## 2013-09-20 MED ORDER — FLUCONAZOLE 100 MG PO TABS: 100.0000 mg | ORAL_TABLET | Freq: Every day | ORAL | Status: DC

## 2013-09-20 MED ORDER — NYSTATIN 100000 UNIT/GM EX POWD: 1.0000 g | Freq: Two times a day (BID) | CUTANEOUS | Status: DC

## 2013-09-20 NOTE — Progress Notes (Signed)
Subjective:    Patient ID: Tiffany [name], female    DOB: 19-Sep-1945, 68 y.o.   MRN: [id number]  HPI  Here for medicare wellness  Diet: heart healthy  Physical activity: sedentary Depression/mood screen: negative Hearing: intact to whispered voice Visual acuity: grossly normal, performs annual eye exam  ADLs: capable Fall risk: none Home safety: good Cognitive evaluation: intact to orientation, naming, recall and repetition EOL planning: adv directives, full code/ I agree  I have personally reviewed and have noted 1. The patient's medical and social history 2. Their use of alcohol, tobacco or illicit drugs 3. Their current medications and supplements 4. The patient's functional ability including ADL's, fall risks, home safety risks and hearing or visual impairment. 5. Diet and physical activities 6. Evidence for depression or mood disorders   also reviewed chronic medical issues and interval medical events  HTN, uncontrolled -"white coat" as pt report good blood pressure control at home - intol of multiple meds: lisinopril+diuretic, benicar, diovan, benicar, norvasc, atenolol - prior eval by cards 10/2011 and 12/2011 for same - uncontrolled on 24h bp monitor 10/2011 and 12/2011 x 21d -currently on prn diuretic and feels well - no chest pain, palpitations or dizziness at this time, felt "terrible" when taking hctz regualrly - home BP log reviewed - range SBP 110-140s - declines other medicines at this time  dyslipidemia - intol of many meds  - muscle cramps on simvastin, niaspan, welchol - still feels thighs are weak due to prior tx - improved with PT. taking fish oil w/o problems - reports "good ratio" and does not feel need for other meds  GERD - no active symptoms at this time -  no hx ulcers - takes OTC meds only as needed   CKD hx - [place] - reports renal fx has improved so no longer following there   Past Medical History  Diagnosis Date   . Obesity, unspecified   . URINARY INCONTINENCE 04-20-12    occ. with nighttime sleep pattern  . SYNCOPE, HX OF   . BENIGN POSITIONAL VERTIGO   . GERD   . DYSLIPIDEMIA   . Raynaud disease   . Complication of anesthesia 04-20-12    some issues with prolonged sedation after anesthesia  . Arthritis 04-20-12    Osteoarthritis-right hip  . HYPERTENSION     severe, pt intol of med tx, Pt. has severe "whitecoat" syndrome   Family History  Problem Relation Age of Onset  . Adopted: Yes   History  Substance Use Topics  . Smoking status: Never Smoker   . 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 Alcohol Use: Yes     Comment: several glasses wine weekly    Review of Systems  Constitutional: Negative for fatigue and unexpected weight change.  Respiratory: Negative for cough, shortness of breath and wheezing.   Cardiovascular: Negative for chest pain, palpitations and leg swelling.  Gastrointestinal: Negative for nausea, abdominal pain and diarrhea.  Skin: Positive for rash (L breast x 2 weeks, not improved with OTC antifungal).  Neurological: Negative for dizziness, weakness, light-headedness and headaches.  Psychiatric/Behavioral: Negative for dysphoric mood. The patient is not nervous/anxious.   All other systems reviewed and are negative.       Objective:   Physical Exam BP 142/90  Pulse 88  Temp(Src) 97.2 F (36.2 C) (Oral)  Ht 5' (1.524  m)  Wt 164 lb 1.9 oz (74.444 kg)  BMI 32.05 kg/m2   Wt Readings from Last 3 Encounters:  09/20/13 164 lb 1.9 oz (74.444 kg)  09/19/12 164 lb (74.39 kg)  04/26/12 164 lb (74.39 kg)   Constitutional: She is overweight; appears well-developed and well-nourished. No distress. HENT: Head: Normocephalic and atraumatic. Ears: B haering aides, when removed, B TMs ok, no erythema or effusion; Nose: Nose normal. Mouth/Throat: Oropharynx is clear and moist. No  oropharyngeal exudate.  Eyes: wears glasses. Conjunctivae and EOM are normal. Pupils are equal, round, and reactive to light. No scleral icterus.  Neck: thick. Normal range of motion. Neck supple. No JVD present. No thyromegaly present.  Cardiovascular: Normal rate, regular rhythm and normal heart sounds.  No murmur heard. trace BLE edema (fatty ankles). Pulmonary/Chest: Effort normal and breath sounds normal. No respiratory distress. She has no wheezes.  Abdominal: Soft. Bowel sounds are normal. She exhibits no distension. There is no tenderness. no masses Musculoskeletal: Normal range of motion, no joint effusions. No gross deformities Neurological: She is alert and oriented to person, place, and time. No cranial nerve deficit. Coordination, balance, strength, speech and gait are normal.  Skin: Candida changes below the left breast and between breasts - remaining skin is warm and dry. No rash noted. No erythema.  Psychiatric: She has a normal mood and affect. Her behavior is normal. Judgment and thought content normal.    Lab Results  Component Value Date   WBC 9.4 04/28/2012   HGB 11.7* 04/28/2012   HCT 35.2* 04/28/2012   PLT 163 04/28/2012   CHOL 211 02/11/2009   HDL 64 02/11/2009   ALT 15 10/01/2011   AST 18 10/01/2011   NA 137 04/28/2012   K 3.4* 04/28/2012   CL 103 04/28/2012   CREATININE 1.00 04/28/2012   BUN 11 04/28/2012   CO2 26 04/28/2012   TSH 2.06 10/01/2011   INR 0.98 04/20/2012       Assessment & Plan:   AWV/v70.0/CPX - Today patient counseled on age appropriate routine health concerns for screening and prevention, each reviewed and up to date or declined. Immunizations reviewed and up to date or declined. Labs reviewed. Risk factors for depression reviewed and negative. Hearing function and visual acuity are intact. ADLs screened and addressed as needed. Functional ability and level of safety reviewed and appropriate. Education, counseling and referrals performed based on assessed  risks today. Patient provided with a copy of personalized plan for preventive services. Refer for DEXA - f/u osteopenia  Candidiasis - Diflucan daily for 5 days and nystatin powder topically - erx done - education reassurance provided. Patient will call if symptoms worse or unimproved  Problem List Items Addressed This Visit   HYPERTENSION      Severe "white coat" per pt - home BP under improved control with prn hctz only Prev tx with intol of multiple meds - lisinopril+diuretic, benicar, diovan, benicar, norvasc, atenolol - currently on no treatment and feels well - no chest pain, edema or dizziness - home BP log reviewed - Pt declines any rx tx due to these factors - advised to call if home BP readings consistently >140s/90s and pt agrees s/p 24h monitor BP from cards 10/2011 and 21d BP monitor 11/2011  S/p cards eval spring 2013 - no evidence of cardiac abnormality working on control of anxiety to manage same - diazepam prn  BP Readings from Last 3 Encounters:  09/20/13 142/90  09/19/12 142/100  04/28/12 128/88  Osteopenia    Other Visit Diagnoses   Routine general medical examination at a health care facility    -  Primary

## 2013-09-20 NOTE — Assessment & Plan Note (Signed)
Severe "white coat" per pt - home BP under improved control with prn hctz only Prev tx with intol of multiple meds - lisinopril+diuretic, benicar, diovan, benicar, norvasc, atenolol - currently on no treatment and feels well - no chest pain, edema or dizziness - home BP log reviewed - Pt declines any rx tx due to these factors - advised to call if home BP readings consistently >140s/90s and pt agrees s/p 24h monitor BP from cards 10/2011 and 21d BP monitor 11/2011  S/p cards eval spring 2013 - no evidence of cardiac abnormality working on control of anxiety to manage same - diazepam prn  BP Readings from Last 3 Encounters:  09/20/13 142/90  09/19/12 142/100  04/28/12 128/88

## 2013-09-20 NOTE — Progress Notes (Signed)
Pre-visit discussion using our clinic review tool. No additional management support is needed unless otherwise documented below in the visit note.  

## 2013-09-20 NOTE — Patient Instructions (Addendum)
It was good to see you today.  We have reviewed your prior records including labs and tests today  Health Maintenance reviewed - Prevnar 21 updated today - all recommended immunizations and age-appropriate screenings are up-to-date.  we'll make referral for DEXA screening. Our office will contact you regarding appointment(s) once made.  Test(s) ordered today. Return when you are fasting next week. Your results will be released to Tiffany [name] (or called to you) after review, usually within 72hours after test completion. If any changes need to be made, you will be notified at that same time.  Medications reviewed and updated, no changes recommended at this time. Refill on medication(s) as discussed today.  Please schedule followup in 12 months for annual exam and labs, call sooner if problems.  Health Maintenance, Female A healthy lifestyle and preventative care can promote health and wellness.  Maintain regular health, dental, and eye exams.  Eat a healthy diet. Foods like vegetables, fruits, whole grains, low-fat dairy products, and lean protein foods contain the nutrients you need without too many calories. Decrease your intake of foods high in solid fats, added sugars, and salt. Get information about a proper diet from your caregiver, if necessary.  Regular physical exercise is one of the most important things you can do for your health. Most adults should get at least 150 minutes of moderate-intensity exercise (any activity that increases your heart rate and causes you to sweat) each week. In addition, most adults need muscle-strengthening exercises on 2 or more days a week.   Maintain a healthy weight. The body mass index (BMI) is a screening tool to identify possible weight problems. It provides an estimate of body fat based on height and weight. Your caregiver can help determine your BMI, and can help you achieve or maintain a healthy weight. For adults 20 years and older:  A BMI below  18.5 is considered underweight.  A BMI of 18.5 to 24.9 is normal.  A BMI of 25 to 29.9 is considered overweight.  A BMI of 30 and above is considered obese.  Maintain normal blood lipids and cholesterol by exercising and minimizing your intake of saturated fat. Eat a balanced diet with plenty of fruits and vegetables. Blood tests for lipids and cholesterol should begin at age 54 and be repeated every 5 years. If your lipid or cholesterol levels are high, you are over 50, or you are a high risk for heart disease, you may need your cholesterol levels checked more frequently.Ongoing high lipid and cholesterol levels should be treated with medicines if diet and exercise are not effective.  If you smoke, find out from your caregiver how to quit. If you do not use tobacco, do not start.  Lung cancer screening is recommended for adults aged 46 80 years who are at high risk for developing lung cancer because of a history of smoking. Yearly low-dose computed tomography (CT) is recommended for people who have at least a 30-pack-year history of smoking and are a current smoker or have quit within the past 15 years. A pack year of smoking is smoking an average of 1 pack of cigarettes a day for 1 year (for example: 1 pack a day for 30 years or 2 packs a day for 15 years). Yearly screening should continue until the smoker has stopped smoking for at least 15 years. Yearly screening should also be stopped for people who develop a health problem that would prevent them from having lung cancer treatment.  If you are  pregnant, do not drink alcohol. If you are breastfeeding, be very cautious about drinking alcohol. If you are not pregnant and choose to drink alcohol, do not exceed 1 drink per day. One drink is considered to be 12 ounces (355 mL) of beer, 5 ounces (148 mL) of wine, or 1.5 ounces (44 mL) of liquor.  Avoid use of street drugs. Do not share needles with anyone. Ask for help if you need support or  instructions about stopping the use of drugs.  High blood pressure causes heart disease and increases the risk of stroke. Blood pressure should be checked at least every 1 to 2 years. Ongoing high blood pressure should be treated with medicines, if weight loss and exercise are not effective.  If you are 62 to 70 years old, ask your caregiver if you should take aspirin to prevent strokes.  Diabetes screening involves taking a blood sample to check your fasting blood sugar level. This should be done once every 3 years, after age 11, if you are within normal weight and without risk factors for diabetes. Testing should be considered at a younger age or be carried out more frequently if you are overweight and have at least 1 risk factor for diabetes.  Breast cancer screening is essential preventative care for women. You should practice "breast self-awareness." This means understanding the normal appearance and feel of your breasts and may include breast self-examination. Any changes detected, no matter how small, should be reported to a caregiver. Women in their 68s and 30s should have a clinical breast exam (CBE) by a caregiver as part of a regular health exam every 1 to 3 years. After age 83, women should have a CBE every year. Starting at age 77, women should consider having a mammogram (breast X-ray) every year. Women who have a family history of breast cancer should talk to their caregiver about genetic screening. Women at a high risk of breast cancer should talk to their caregiver about having an MRI and a mammogram every year.  Breast cancer gene (BRCA)-related cancer risk assessment is recommended for women who have family members with BRCA-related cancers. BRCA-related cancers include breast, ovarian, tubal, and peritoneal cancers. Having family members with these cancers may be associated with an increased risk for harmful changes (mutations) in the breast cancer genes BRCA1 and BRCA2. Results of the  assessment will determine the need for genetic counseling and BRCA1 and BRCA2 testing.  The Pap test is a screening test for cervical cancer. Women should have a Pap test starting at age 29. Between ages 15 and 41, Pap tests should be repeated every 2 years. Beginning at age 75, you should have a Pap test every 3 years as long as the past 3 Pap tests have been normal. If you had a hysterectomy for a problem that was not cancer or a condition that could lead to cancer, then you no longer need Pap tests. If you are between ages 54 and 69, and you have had normal Pap tests going back 10 years, you no longer need Pap tests. If you have had past treatment for cervical cancer or a condition that could lead to cancer, you need Pap tests and screening for cancer for at least 20 years after your treatment. If Pap tests have been discontinued, risk factors (such as a new sexual partner) need to be reassessed to determine if screening should be resumed. Some women have medical problems that increase the chance of getting cervical cancer. In these  cases, your caregiver may recommend more frequent screening and Pap tests.  The human papillomavirus (HPV) test is an additional test that may be used for cervical cancer screening. The HPV test looks for the virus that can cause the cell changes on the cervix. The cells collected during the Pap test can be tested for HPV. The HPV test could be used to screen women aged 64 years and older, and should be used in women of any age who have unclear Pap test results. After the age of 3, women should have HPV testing at the same frequency as a Pap test.  Colorectal cancer can be detected and often prevented. Most routine colorectal cancer screening begins at the age of 34 and continues through age 59. However, your caregiver may recommend screening at an earlier age if you have risk factors for colon cancer. On a yearly basis, your caregiver may provide home test kits to check for  hidden blood in the stool. Use of a small camera at the end of a tube, to directly examine the colon (sigmoidoscopy or colonoscopy), can detect the earliest forms of colorectal cancer. Talk to your caregiver about this at age 78, when routine screening begins. Direct examination of the colon should be repeated every 5 to 10 years through age 51, unless early forms of pre-cancerous polyps or small growths are found.  Hepatitis C blood testing is recommended for all people born from [birth year] through 1967 and any individual with known risks for hepatitis C.  Practice safe sex. Use condoms and avoid high-risk sexual practices to reduce the spread of sexually transmitted infections (STIs). Sexually active women aged 61 and younger should be checked for Chlamydia, which is a common sexually transmitted infection. Older women with new or multiple partners should also be tested for Chlamydia. Testing for other STIs is recommended if you are sexually active and at increased risk.  Osteoporosis is a disease in which the bones lose minerals and strength with aging. This can result in serious bone fractures. The risk of osteoporosis can be identified using a bone density scan. Women ages 25 and over and women at risk for fractures or osteoporosis should discuss screening with their caregivers. Ask your caregiver whether you should be taking a calcium supplement or vitamin D to reduce the rate of osteoporosis.  Menopause can be associated with physical symptoms and risks. Hormone replacement therapy is available to decrease symptoms and risks. You should talk to your caregiver about whether hormone replacement therapy is right for you.  Use sunscreen. Apply sunscreen liberally and repeatedly throughout the day. You should seek shade when your shadow is shorter than you. Protect yourself by wearing long sleeves, pants, a wide-brimmed hat, and sunglasses year round, whenever you are outdoors.  Notify your caregiver of new  moles or changes in moles, especially if there is a change in shape or color. Also notify your caregiver if a mole is larger than the size of a pencil eraser.  Stay current with your immunizations. Document Released: 02/02/2011 Document Revised: 11/14/2012 Document Reviewed: 02/02/2011 [facility] Patient Information 2014 [place]. Health Maintenance, Female A healthy lifestyle and preventative care can promote health and wellness.  Maintain regular health, dental, and eye exams.  Eat a healthy diet. Foods like vegetables, fruits, whole grains, low-fat dairy products, and lean protein foods contain the nutrients you need without too many calories. Decrease your intake of foods high in solid fats, added sugars, and salt. Get information about a proper diet  from your caregiver, if necessary.  Regular physical exercise is one of the most important things you can do for your health. Most adults should get at least 150 minutes of moderate-intensity exercise (any activity that increases your heart rate and causes you to sweat) each week. In addition, most adults need muscle-strengthening exercises on 2 or more days a week.   Maintain a healthy weight. The body mass index (BMI) is a screening tool to identify possible weight problems. It provides an estimate of body fat based on height and weight. Your caregiver can help determine your BMI, and can help you achieve or maintain a healthy weight. For adults 20 years and older:  A BMI below 18.5 is considered underweight.  A BMI of 18.5 to 24.9 is normal.  A BMI of 25 to 29.9 is considered overweight.  A BMI of 30 and above is considered obese.  Maintain normal blood lipids and cholesterol by exercising and minimizing your intake of saturated fat. Eat a balanced diet with plenty of fruits and vegetables. Blood tests for lipids and cholesterol should begin at age 109 and be repeated every 5 years. If your lipid or cholesterol levels are high, you  are over 50, or you are a high risk for heart disease, you may need your cholesterol levels checked more frequently.Ongoing high lipid and cholesterol levels should be treated with medicines if diet and exercise are not effective.  If you smoke, find out from your caregiver how to quit. If you do not use tobacco, do not start.  Lung cancer screening is recommended for adults aged 72 80 years who are at high risk for developing lung cancer because of a history of smoking. Yearly low-dose computed tomography (CT) is recommended for people who have at least a 30-pack-year history of smoking and are a current smoker or have quit within the past 15 years. A pack year of smoking is smoking an average of 1 pack of cigarettes a day for 1 year (for example: 1 pack a day for 30 years or 2 packs a day for 15 years). Yearly screening should continue until the smoker has stopped smoking for at least 15 years. Yearly screening should also be stopped for people who develop a health problem that would prevent them from having lung cancer treatment.  If you are pregnant, do not drink alcohol. If you are breastfeeding, be very cautious about drinking alcohol. If you are not pregnant and choose to drink alcohol, do not exceed 1 drink per day. One drink is considered to be 12 ounces (355 mL) of beer, 5 ounces (148 mL) of wine, or 1.5 ounces (44 mL) of liquor.  Avoid use of street drugs. Do not share needles with anyone. Ask for help if you need support or instructions about stopping the use of drugs.  High blood pressure causes heart disease and increases the risk of stroke. Blood pressure should be checked at least every 1 to 2 years. Ongoing high blood pressure should be treated with medicines, if weight loss and exercise are not effective.  If you are 34 to 70 years old, ask your caregiver if you should take aspirin to prevent strokes.  Diabetes screening involves taking a blood sample to check your fasting blood  sugar level. This should be done once every 3 years, after age 26, if you are within normal weight and without risk factors for diabetes. Testing should be considered at a younger age or be carried out more frequently if  you are overweight and have at least 1 risk factor for diabetes.  Breast cancer screening is essential preventative care for women. You should practice "breast self-awareness." This means understanding the normal appearance and feel of your breasts and may include breast self-examination. Any changes detected, no matter how small, should be reported to a caregiver. Women in their 1s and 30s should have a clinical breast exam (CBE) by a caregiver as part of a regular health exam every 1 to 3 years. After age 58, women should have a CBE every year. Starting at age 33, women should consider having a mammogram (breast X-ray) every year. Women who have a family history of breast cancer should talk to their caregiver about genetic screening. Women at a high risk of breast cancer should talk to their caregiver about having an MRI and a mammogram every year.  Breast cancer gene (BRCA)-related cancer risk assessment is recommended for women who have family members with BRCA-related cancers. BRCA-related cancers include breast, ovarian, tubal, and peritoneal cancers. Having family members with these cancers may be associated with an increased risk for harmful changes (mutations) in the breast cancer genes BRCA1 and BRCA2. Results of the assessment will determine the need for genetic counseling and BRCA1 and BRCA2 testing.  The Pap test is a screening test for cervical cancer. Women should have a Pap test starting at age 67. Between ages 90 and 33, Pap tests should be repeated every 2 years. Beginning at age 26, you should have a Pap test every 3 years as long as the past 3 Pap tests have been normal. If you had a hysterectomy for a problem that was not cancer or a condition that could lead to cancer,  then you no longer need Pap tests. If you are between ages 74 and 47, and you have had normal Pap tests going back 10 years, you no longer need Pap tests. If you have had past treatment for cervical cancer or a condition that could lead to cancer, you need Pap tests and screening for cancer for at least 20 years after your treatment. If Pap tests have been discontinued, risk factors (such as a new sexual partner) need to be reassessed to determine if screening should be resumed. Some women have medical problems that increase the chance of getting cervical cancer. In these cases, your caregiver may recommend more frequent screening and Pap tests.  The human papillomavirus (HPV) test is an additional test that may be used for cervical cancer screening. The HPV test looks for the virus that can cause the cell changes on the cervix. The cells collected during the Pap test can be tested for HPV. The HPV test could be used to screen women aged 61 years and older, and should be used in women of any age who have unclear Pap test results. After the age of 50, women should have HPV testing at the same frequency as a Pap test.  Colorectal cancer can be detected and often prevented. Most routine colorectal cancer screening begins at the age of 43 and continues through age 54. However, your caregiver may recommend screening at an earlier age if you have risk factors for colon cancer. On a yearly basis, your caregiver may provide home test kits to check for hidden blood in the stool. Use of a small camera at the end of a tube, to directly examine the colon (sigmoidoscopy or colonoscopy), can detect the earliest forms of colorectal cancer. Talk to your caregiver about this at  age 9, when routine screening begins. Direct examination of the colon should be repeated every 5 to 10 years through age 61, unless early forms of pre-cancerous polyps or small growths are found.  Hepatitis C blood testing is recommended for all people  born from [birth year] through 1967 and any individual with known risks for hepatitis C.  Practice safe sex. Use condoms and avoid high-risk sexual practices to reduce the spread of sexually transmitted infections (STIs). Sexually active women aged 58 and younger should be checked for Chlamydia, which is a common sexually transmitted infection. Older women with new or multiple partners should also be tested for Chlamydia. Testing for other STIs is recommended if you are sexually active and at increased risk.  Osteoporosis is a disease in which the bones lose minerals and strength with aging. This can result in serious bone fractures. The risk of osteoporosis can be identified using a bone density scan. Women ages 22 and over and women at risk for fractures or osteoporosis should discuss screening with their caregivers. Ask your caregiver whether you should be taking a calcium supplement or vitamin D to reduce the rate of osteoporosis.  Menopause can be associated with physical symptoms and risks. Hormone replacement therapy is available to decrease symptoms and risks. You should talk to your caregiver about whether hormone replacement therapy is right for you.  Use sunscreen. Apply sunscreen liberally and repeatedly throughout the day. You should seek shade when your shadow is shorter than you. Protect yourself by wearing long sleeves, pants, a wide-brimmed hat, and sunglasses year round, whenever you are outdoors.  Notify your caregiver of new moles or changes in moles, especially if there is a change in shape or color. Also notify your caregiver if a mole is larger than the size of a pencil eraser.  Stay current with your immunizations. Document Released: 02/02/2011 Document Revised: 11/14/2012 Document Reviewed: 02/02/2011 [facility] Patient Information 2014 [place].

## 2013-09-27 ENCOUNTER — Encounter: Payer: Self-pay | Admitting: Internal Medicine

## 2013-09-27 ENCOUNTER — Ambulatory Visit (INDEPENDENT_AMBULATORY_CARE_PROVIDER_SITE_OTHER): Payer: Medicare Other | Admitting: Internal Medicine

## 2013-09-27 VITALS — BP 148/90 | HR 91 | Temp 98.4°F

## 2013-09-27 DIAGNOSIS — I10 Essential (primary) hypertension: Secondary | ICD-10-CM | POA: Diagnosis not present

## 2013-09-27 DIAGNOSIS — IMO0001 Reserved for inherently not codable concepts without codable children: Secondary | ICD-10-CM

## 2013-09-27 DIAGNOSIS — J329 Chronic sinusitis, unspecified: Secondary | ICD-10-CM | POA: Diagnosis not present

## 2013-09-27 DIAGNOSIS — B379 Candidiasis, unspecified: Secondary | ICD-10-CM | POA: Diagnosis not present

## 2013-09-27 DIAGNOSIS — M949 Disorder of cartilage, unspecified: Secondary | ICD-10-CM

## 2013-09-27 DIAGNOSIS — M899 Disorder of bone, unspecified: Secondary | ICD-10-CM | POA: Diagnosis not present

## 2013-09-27 DIAGNOSIS — M8580 Other specified disorders of bone density and structure, unspecified site: Secondary | ICD-10-CM

## 2013-09-27 MED ORDER — AZITHROMYCIN 500 MG PO TABS: 500.0000 mg | ORAL_TABLET | Freq: Every day | ORAL | Status: DC

## 2013-09-27 MED ORDER — FLUTICASONE PROPIONATE 50 MCG/ACT NA SUSP: 1.0000 | Freq: Every day | NASAL | Status: DC

## 2013-09-27 MED ORDER — FLUCONAZOLE 100 MG PO TABS: 100.0000 mg | ORAL_TABLET | Freq: Every day | ORAL | Status: DC

## 2013-09-27 MED ORDER — ESTRADIOL 0.1 MG/GM VA CREA: TOPICAL_CREAM | VAGINAL | Status: DC

## 2013-09-27 NOTE — Assessment & Plan Note (Signed)
Severe "white coat" per pt - home BP under improved control with prn hctz only Prev tx with intol of multiple meds - lisinopril+diuretic, benicar, diovan, benicar, norvasc, atenolol - currently on no treatment and feels well - no chest pain, edema or dizziness - home BP log reviewed - Pt declines any rx tx due to these factors - advised to call if home BP readings consistently >140s/90s and pt agrees s/p 24h monitor BP from cards 10/2011 and 21d BP monitor 11/2011  S/p cards eval spring 2013 - no evidence of cardiac abnormality working on control of anxiety to manage same - diazepam prn  BP Readings from Last 3 Encounters:  09/27/13 148/90  09/20/13 142/90  09/19/12 142/100

## 2013-09-27 NOTE — Patient Instructions (Signed)
It was good to see you today.  Zpak antibiotics and prescription nasal spray - Your prescription(s) and refills have been submitted to your pharmacy. Please take as directed and contact our office if you believe you are having problem(s) with the medication(s).  Alternate between ibuprofen and tylenol for aches, pain and fever symptoms as discussed  Hydrate, rest and call if worse or unimproved

## 2013-09-27 NOTE — Progress Notes (Signed)
Subjective:    HPI  complains of head cold symptoms, ?sinusitus Onset 1 week ago, initially improved then relapsing and worse symptoms  First associated with rhinorrhea, sneezing, sore throat, mild headache and low grade fever Now sinus pressure and mild-mod nasal congestion, yellow-green discharge No relief with OTC meds Precipitated by sick contacts and weather change  Past Medical History  Diagnosis Date  . Obesity, unspecified   . URINARY INCONTINENCE 04-20-12    occ. with nighttime sleep pattern  . SYNCOPE, HX OF   . BENIGN POSITIONAL VERTIGO   . GERD   . DYSLIPIDEMIA   . Raynaud disease   . Complication of anesthesia 04-20-12    some issues with prolonged sedation after anesthesia  . Arthritis 04-20-12    Osteoarthritis-right hip  . HYPERTENSION     severe, pt intol of med tx, Pt. has severe "whitecoat" syndrome  . Osteopenia     Review of Systems Constitutional: No night sweats, no unexpected weight change Pulmonary: No pleurisy or hemoptysis Cardiovascular: No chest pain or palpitations     Objective:   Physical Exam BP 148/90  Pulse 91  Temp(Src) 98.4 F (36.9 C) (Oral)  SpO2 98% GEN: mildly ill appearing and audible head/chest congestion HENT: NCAT, mild sinus tenderness bilaterally, nares with thick discharge and turbinate swelling, oropharynx mild erythema and PND, no exudate Eyes: Vision grossly intact, no conjunctivitis Lungs: Clear to auscultation without rhonchi or wheeze, no increased work of breathing Cardiovascular: Regular rate and rhythm, no bilateral edema  Lab Results  Component Value Date   WBC 9.4 04/28/2012   HGB 11.7* 04/28/2012   HCT 35.2* 04/28/2012   PLT 163 04/28/2012   GLUCOSE 112* 04/28/2012   CHOL 211 02/11/2009   HDL 64 02/11/2009   LDLCALC 110 02/11/2009   ALT 15 10/01/2011   AST 18 10/01/2011   NA 137 04/28/2012   K 3.4* 04/28/2012   CL 103 04/28/2012   CREATININE 1.00 04/28/2012   BUN 11 04/28/2012   CO2 26 04/28/2012   TSH  2.06 10/01/2011   INR 0.98 04/20/2012      Assessment & Plan:  Viral URI > progression to acute sinusitis Cough, postnasal drip related to above   Empiric antibiotics prescribed due to symptom duration greater than 7 days and progression despite OTC symptomatic care also nasal steroid - new prescriptions done Symptomatic care with Tylenol or Advil, decongestants, antihistamine, OTC cough med, hydration and rest -  Saline irrigation and salt gargle advised as needed   Recent candidiasis beneath bilateral breasts. Will retreat with Diflucan and continue topical antifungal given new prescription of anti-chronic for sinus disease. Refill provided. Patient education provided on timeline for expected rash resolution  Problem List Items Addressed This Visit   Osteopenia     Reviewed results of recent bone density Patient to continue over-the-counter calcium and vitamin D, recommended doses reviewed in depth Patient will also continue weightbearing activity, repeat test in 2 years, sooner if problems    White coat hypertension      Severe "white coat" per pt - home BP under improved control with prn hctz only Prev tx with intol of multiple meds - lisinopril+diuretic, benicar, diovan, benicar, norvasc, atenolol - currently on no treatment and feels well - no chest pain, edema or dizziness - home BP log reviewed - Pt declines any rx tx due to these factors - advised to call if home BP readings consistently >140s/90s and pt agrees s/p 24h monitor BP from  cards 10/2011 and 21d BP monitor 11/2011  S/p cards eval spring 2013 - no evidence of cardiac abnormality working on control of anxiety to manage same - diazepam prn  BP Readings from Last 3 Encounters:  09/27/13 148/90  09/20/13 142/90  09/19/12 142/100       Other Visit Diagnoses   Unspecified sinusitis (chronic)    -  Primary    Relevant Medications       azithromycin (ZITHROMAX) tablet       fluconazole (DIFLUCAN) tablet 100 mg        FLONASE 50 MCG/ACT NA SUSP    Yeast infection

## 2013-09-27 NOTE — Progress Notes (Signed)
Pre-visit discussion using our clinic review tool. No additional management support is needed unless otherwise documented below in the visit note.  

## 2013-09-27 NOTE — Assessment & Plan Note (Signed)
Reviewed results of recent bone density Patient to continue over-the-counter calcium and vitamin D, recommended doses reviewed in depth Patient will also continue weightbearing activity, repeat test in 2 years, sooner if problems

## 2013-10-03 ENCOUNTER — Encounter: Payer: Self-pay | Admitting: Internal Medicine

## 2013-10-11 ENCOUNTER — Other Ambulatory Visit (INDEPENDENT_AMBULATORY_CARE_PROVIDER_SITE_OTHER): Payer: Medicare Other

## 2013-10-11 DIAGNOSIS — M899 Disorder of bone, unspecified: Secondary | ICD-10-CM

## 2013-10-11 DIAGNOSIS — I10 Essential (primary) hypertension: Secondary | ICD-10-CM

## 2013-10-11 DIAGNOSIS — M8580 Other specified disorders of bone density and structure, unspecified site: Secondary | ICD-10-CM

## 2013-10-11 DIAGNOSIS — B379 Candidiasis, unspecified: Secondary | ICD-10-CM

## 2013-10-11 DIAGNOSIS — M949 Disorder of cartilage, unspecified: Secondary | ICD-10-CM

## 2013-10-11 DIAGNOSIS — Z0000 Encounter for general adult medical examination without abnormal findings: Secondary | ICD-10-CM | POA: Diagnosis not present

## 2013-10-11 LAB — BASIC METABOLIC PANEL
BUN: 16 mg/dL (ref 6–23)
CO2: 27 mEq/L (ref 19–32)
Calcium: 9 mg/dL (ref 8.4–10.5)
Chloride: 107 mEq/L (ref 96–112)
Creatinine, Ser: 1.2 mg/dL (ref 0.4–1.2)
GFR: 49.66 mL/min — ABNORMAL LOW (ref >60.00)
Glucose, Bld: 91 mg/dL (ref 70–99)
Potassium: 4 mEq/L (ref 3.5–5.1)
Sodium: 140 mEq/L (ref 135–145)

## 2013-10-11 LAB — CBC WITH DIFFERENTIAL/PLATELET
Basophils Absolute: 0 10*3/uL (ref 0.0–0.1)
Basophils Relative: 0.6 % (ref 0.0–3.0)
Eosinophils Absolute: 0.2 10*3/uL (ref 0.0–0.7)
Eosinophils Relative: 2.9 % (ref 0.0–5.0)
HCT: 41.8 % (ref 36.0–46.0)
Hemoglobin: 14.2 g/dL (ref 12.0–15.0)
Lymphocytes Relative: 46.5 % — ABNORMAL HIGH (ref 12.0–46.0)
Lymphs Abs: 3.2 10*3/uL (ref 0.7–4.0)
MCHC: 33.9 g/dL (ref 30.0–36.0)
MCV: 90 fl (ref 78.0–100.0)
Monocytes Absolute: 0.5 10*3/uL (ref 0.1–1.0)
Monocytes Relative: 7.4 % (ref 3.0–12.0)
Neutro Abs: 2.9 10*3/uL (ref 1.4–7.7)
Neutrophils Relative %: 42.6 % — ABNORMAL LOW (ref 43.0–77.0)
Platelets: 289 10*3/uL (ref 150.0–400.0)
RBC: 4.65 Mil/uL (ref 3.87–5.11)
RDW: 13.4 % (ref 11.5–14.6)
WBC: 6.8 10*3/uL (ref 4.5–10.5)

## 2013-10-11 LAB — URINALYSIS, ROUTINE W REFLEX MICROSCOPIC
Bilirubin Urine: NEGATIVE
Ketones, ur: NEGATIVE
Nitrite: NEGATIVE
Specific Gravity, Urine: 1.02 (ref 1.000–1.030)
Total Protein, Urine: NEGATIVE
Urine Glucose: NEGATIVE
Urobilinogen, UA: 0.2 (ref 0.0–1.0)
pH: 6 (ref 5.0–8.0)

## 2013-10-11 LAB — LIPID PANEL
Cholesterol: 214 mg/dL — ABNORMAL HIGH (ref 0–200)
HDL: 54.4 mg/dL (ref >39.00)
LDL Cholesterol: 128 mg/dL — ABNORMAL HIGH (ref 0–99)
Total CHOL/HDL Ratio: 4
Triglycerides: 157 mg/dL — ABNORMAL HIGH (ref 0.0–149.0)
VLDL: 31.4 mg/dL (ref 0.0–40.0)

## 2013-10-11 LAB — HEPATIC FUNCTION PANEL
ALT: 12 U/L (ref 0–35)
AST: 16 U/L (ref 0–37)
Albumin: 3.5 g/dL (ref 3.5–5.2)
Alkaline Phosphatase: 56 U/L (ref 39–117)
Bilirubin, Direct: 0.1 mg/dL (ref 0.0–0.3)
Total Bilirubin: 0.5 mg/dL (ref 0.3–1.2)
Total Protein: 7 g/dL (ref 6.0–8.3)

## 2013-10-11 LAB — TSH: TSH: 4.62 u[IU]/mL (ref 0.35–5.50)

## 2013-11-20 ENCOUNTER — Encounter: Payer: Self-pay | Admitting: Internal Medicine

## 2013-12-12 ENCOUNTER — Other Ambulatory Visit (INDEPENDENT_AMBULATORY_CARE_PROVIDER_SITE_OTHER): Payer: Medicare Other

## 2013-12-12 ENCOUNTER — Encounter: Payer: Self-pay | Admitting: Internal Medicine

## 2013-12-12 ENCOUNTER — Ambulatory Visit (INDEPENDENT_AMBULATORY_CARE_PROVIDER_SITE_OTHER): Payer: Medicare Other | Admitting: Internal Medicine

## 2013-12-12 VITALS — BP 168/118 | HR 95 | Temp 98.2°F | Wt 163.0 lb

## 2013-12-12 DIAGNOSIS — IMO0001 Reserved for inherently not codable concepts without codable children: Secondary | ICD-10-CM

## 2013-12-12 DIAGNOSIS — B372 Candidiasis of skin and nail: Secondary | ICD-10-CM

## 2013-12-12 DIAGNOSIS — I10 Essential (primary) hypertension: Secondary | ICD-10-CM | POA: Diagnosis not present

## 2013-12-12 LAB — HEMOGLOBIN A1C: Hgb A1c MFr Bld: 5.2 % (ref 4.6–6.5)

## 2013-12-12 MED ORDER — KETOCONAZOLE 2 % EX CREA: 1.0000 "application " | TOPICAL_CREAM | Freq: Two times a day (BID) | CUTANEOUS | Status: DC

## 2013-12-12 NOTE — Patient Instructions (Signed)
Your next office appointment will be determined based upon review of your pending labs . Those instructions will be transmitted to you through My Chart  OR  by mail;whichever process is your choice to receive results & recommendations .Followup as needed for your acute issue. Please report any significant change in your symptoms. Minimal Blood Pressure Goal= AVERAGE < 140/90;  Ideal is an AVERAGE < 135/85. This AVERAGE should be calculated from @ least 5-7 BP readings taken @ different times of day on different days of week. You should not respond to isolated BP readings , but rather the AVERAGE for that week .PLEASE bring your  blood pressure cuff to office visits to verify that it is reliable & to prove you have White Coat Hypertension.

## 2013-12-12 NOTE — Progress Notes (Signed)
   Subjective:    Patient ID: Tiffany [name], female    DOB: Apr 04, 1946, 69 y.o.   MRN: [id number]  HPI  She has had a rash in the left inframammary area and periumbilical and inguinal areas for several months. It has only improved 30% with Diflucan orally for 10 days and nystatin powder applications for 3 weeks  Extensive labs were done in January of this year and were essentially normal  Her glucose was normal at that time; she has no A1c on record    Review of Systems  Her blood pressure elevation was discussed. She states that this is her usual finding in the office but her blood pressures at home are normal. She uses the same cuff her husband does & they have verified readings are valid  She did take blood pressure medicines  but "almost fainted" because her blood pressure was so low.  She also gives a history of ? Possible rhabdomyolysis with kidney impairment after taking statins for several months  She denies polyuria, polyphagia, or polydipsia.      Objective:   Physical Exam Gen.: Healthy and well-nourished in appearance. Alert, appropriate and cooperative throughout exam.  Eyes: No corneal or conjunctival inflammation noted. No icterus Lungs: Normal respiratory effort; chest expands symmetrically. Lungs are clear to auscultation without rales, wheezes, or increased work of breathing. Heart: Normal rate and rhythm. Normal S1 and S2. No gallop, click, or rub.No murmur.                                 Musculoskeletal/extremities:  No clubbing, cyanosis, edema, or significant extremity  deformity noted.   Fingernail health good.  Neurologic: Alert and oriented x3.    Skin: Classic Candidal rash under her left breast; in the umbilicus; and in the suprapubic area Lymph: No cervical, axillary lymphadenopathy present. Psych: Mood and affect are normal. Normally interactive                                                                                          Assessment & Plan:  #1 Candida  #2 severely elevated hypertension which patient attributes to the white coat syndrome. I stressed that it is critical to bring her home cuff and readings to each office visit to verify that there is no risk. Repeat blood pressure was 158/118.  See orders and after visit summary

## 2013-12-12 NOTE — Progress Notes (Signed)
Pre visit review using our clinic review tool, if applicable. No additional management support is needed unless otherwise documented below in the visit note. 

## 2013-12-15 ENCOUNTER — Encounter: Payer: Self-pay | Admitting: *Deleted

## 2013-12-15 NOTE — Progress Notes (Signed)
Generated letter & mailed...Tiffany [name]

## 2014-01-08 ENCOUNTER — Encounter: Payer: Self-pay | Admitting: Internal Medicine

## 2014-01-19 ENCOUNTER — Telehealth: Payer: Self-pay | Admitting: Internal Medicine

## 2014-01-19 DIAGNOSIS — B372 Candidiasis of skin and nail: Secondary | ICD-10-CM

## 2014-01-19 MED ORDER — KETOCONAZOLE 2 % EX CREA: 1.0000 "application " | TOPICAL_CREAM | Freq: Two times a day (BID) | CUTANEOUS | Status: DC

## 2014-01-19 NOTE — Telephone Encounter (Signed)
Notified pt md approve cream has been sent to her pharmacy...Johny Chess

## 2014-01-19 NOTE — Telephone Encounter (Signed)
Ok erx done

## 2014-01-19 NOTE — Telephone Encounter (Signed)
Pt wants to know if she can have a refill on the Ketoconazole 2%cream she had for a yeast infection before.  Pharmacy is [facility] on [address].  She is going out of town for 2 weeks.

## 2014-03-19 ENCOUNTER — Telehealth: Payer: Self-pay | Admitting: *Deleted

## 2014-03-19 ENCOUNTER — Ambulatory Visit (AMBULATORY_SURGERY_CENTER): Payer: Self-pay | Admitting: *Deleted

## 2014-03-19 VITALS — Ht 60.0 in | Wt 163.8 lb

## 2014-03-19 DIAGNOSIS — Z86010 Personal history of colonic polyps: Secondary | ICD-10-CM

## 2014-03-19 MED ORDER — MOVIPREP 100 G PO SOLR: ORAL | Status: DC

## 2014-03-19 NOTE — Telephone Encounter (Signed)
She should mention this to me on the day of the procedure. Please ask her to do this. Thanks

## 2014-03-19 NOTE — Telephone Encounter (Signed)
Dr Henrene Pastor: pt is scheduled for recall colonoscopy 03/27/2014.  During PV pt says that she is having dysphagia with solid and sometimes liquids; vomiting will relieve.  This happens about once a month and has been happening for about 2 years.  She does not take any medication for this.  She would like to have colonoscopy as scheduled and discuss with you the day of procedure or schedule OV.  Please advise.  Thanks, Juliann Pulse

## 2014-03-19 NOTE — Progress Notes (Signed)
No allergies to eggs or soy. No problems with anesthesia.  Pt given Emmi instructions for colonoscopy  No oxygen use  No diet drug use  Pt having dysphagia to solid and liquids about once a month for 2 years.  Pt will talk to Dr Henrene Pastor day of procedure per phone note 8/17

## 2014-03-19 NOTE — Telephone Encounter (Signed)
Talked with pt and instructed her to talk with Dr. Henrene Pastor day of procedure about dysphagia.

## 2014-03-22 DIAGNOSIS — H2510 Age-related nuclear cataract, unspecified eye: Secondary | ICD-10-CM | POA: Diagnosis not present

## 2014-03-27 ENCOUNTER — Ambulatory Visit (AMBULATORY_SURGERY_CENTER): Payer: Medicare Other | Admitting: Internal Medicine

## 2014-03-27 ENCOUNTER — Encounter: Payer: Self-pay | Admitting: Internal Medicine

## 2014-03-27 VITALS — BP 190/116 | HR 73 | Temp 97.6°F | Resp 21

## 2014-03-27 DIAGNOSIS — Z86010 Personal history of colonic polyps: Secondary | ICD-10-CM | POA: Diagnosis not present

## 2014-03-27 DIAGNOSIS — I10 Essential (primary) hypertension: Secondary | ICD-10-CM | POA: Diagnosis not present

## 2014-03-27 DIAGNOSIS — Z1211 Encounter for screening for malignant neoplasm of colon: Secondary | ICD-10-CM | POA: Diagnosis not present

## 2014-03-27 MED ORDER — SODIUM CHLORIDE 0.9 % IV SOLN: 500.0000 mL | INTRAVENOUS | Status: DC

## 2014-03-27 NOTE — Progress Notes (Signed)
A/ox3 pleased with MAC, report to Suzanne RN 

## 2014-03-27 NOTE — Progress Notes (Addendum)
Told patient about her high bp.  She insists that her BP is "Fine."  Stated that her reading at home was 130/96.  I told her that the 54 was high and that she needed to see her PCP regarding her BP.  Her husband stated that, "She is fine."  Patient has swollen ankles without pitting edema.  Patient stated that she "Just had fat ankles,"  And she "Wasn't about to take bp meds."

## 2014-03-27 NOTE — Op Note (Signed)
[place]  [address]. [place], [address]   COLONOSCOPY PROCEDURE REPORT  PATIENT: Tiffany [name], Tiffany [name]  MR#: [id number] BIRTHDATE: [birth year] , 67  yrs. old GENDER: Female ENDOSCOPIST: Eustace Quail, MD REFERRED [id number] Program Recall PROCEDURE DATE:  03/27/2014 PROCEDURE:   Colonoscopy, surveillance First Screening Colonoscopy - Avg.  risk and is 50 yrs.  old or older - No.  Prior Negative Screening - Now for repeat screening. N/A  History of Adenoma - Now for follow-up colonoscopy & has been > or = to 3 yrs.  Yes hx of adenoma.  Has been 3 or more years since last colonoscopy.  Polyps Removed Today? No.  Recommend repeat exam, <10 yrs? No. ASA CLASS:   Class II INDICATIONS:Patient's personal history of adenomatous colon polyps. Prior exams 2004 and 2009 (small TAs). MEDICATIONS: MAC sedation, administered by CRNA and propofol (Diprivan) 200mg  IV  DESCRIPTION OF PROCEDURE:   After the risks benefits and alternatives of the procedure were thoroughly explained, informed consent was obtained.  A digital rectal exam revealed no abnormalities of the rectum.   The LB [id number]  endoscope was introduced through the anus and advanced to the cecum, which was identified by both the appendix and ileocecal valve. No adverse events experienced.   The quality of the prep was excellent, using MoviPrep  The instrument was then slowly withdrawn as the colon was fully examined.     COLON FINDINGS: Moderate diverticulosis was noted  in the right colon and  left colon.   The colon mucosa was otherwise normal. Retroflexed views revealed small internal hemorrhoids and hypertrophic anal papilla. The time to cecum=4 minutes 18 seconds. Withdrawal time=12 minutes 03 seconds.  The scope was withdrawn and the procedure completed.  COMPLICATIONS: There were no complications.  ENDOSCOPIC IMPRESSION: 1.   Moderate diverticulosis was noted in the right colon and  left colon 2.   The colon mucosa was otherwise normal . NO POLYPS  RECOMMENDATIONS: 1.  Continue current colorectal surveillance recommendations  with a repeat colonoscopy in 10 years. 2.  Upper endoscopy will be scheduled in [place] " DYSPHAGIA"   eSigned:  Eustace Quail, MD 03/27/2014 8:45 AM   cc: Rowe Clack, MD and The Patient

## 2014-03-27 NOTE — Progress Notes (Signed)
Tiffany Angst, CRNA was notified that the pt's blood pressure was 181 /110 lt arm and 167/123.  Per the pt she said she has a hx of" white coat syndrome".  She said she took Valium 5 mg at 4:55 am today.  maw

## 2014-03-27 NOTE — Patient Instructions (Addendum)
YOU HAD AN ENDOSCOPIC PROCEDURE TODAY AT [facility]: Refer to the procedure report that was given to you for any specific questions about what was found during the examination.  If the procedure report does not answer your questions, please call your gastroenterologist to clarify.  If you requested that your care partner not be given the details of your procedure findings, then the procedure report has been included in a sealed envelope for you to review at your convenience later.  YOU SHOULD EXPECT: Some feelings of bloating in the abdomen. Passage of more gas than usual.  Walking can help get rid of the air that was put into your GI tract during the procedure and reduce the bloating. If you had a lower endoscopy (such as a colonoscopy or flexible sigmoidoscopy) you may notice spotting of blood in your stool or on the toilet paper. If you underwent a bowel prep for your procedure, then you may not have a normal bowel movement for a few days.  DIET: Your first meal following the procedure should be a light meal and then it is ok to progress to your normal diet.  A half-sandwich or bowl of soup is an example of a good first meal.  Heavy or fried foods are harder to digest and may make you feel nauseous or bloated.  Likewise meals heavy in dairy and vegetables can cause extra gas to form and this can also increase the bloating.  Drink plenty of fluids but you should avoid alcoholic beverages for 24 hours.  Try to increase the fiber in your diet due to your Diverticulosis.  ACTIVITY: Your care partner should take you home directly after the procedure.  You should plan to take it easy, moving slowly for the rest of the day.  You can resume normal activity the day after the procedure however you should NOT DRIVE or use heavy machinery for 24 hours (because of the sedation medicines used during the test).    SYMPTOMS TO REPORT IMMEDIATELY: A gastroenterologist can be reached at any hour.  During  normal business hours, 8:30 AM to 5:00 PM Monday through Friday, call [phone].  After hours and on weekends, please call the GI answering service at [phone] who will take a message and have the physician on call contact you.   Following lower endoscopy (colonoscopy or flexible sigmoidoscopy):  Excessive amounts of blood in the stool  Significant tenderness or worsening of abdominal pains  Swelling of the abdomen that is new, acute  Fever of 100F or higher  FOLLOW UP: If any biopsies were taken you will be contacted by phone or by letter within the next 1-3 weeks.  Call your gastroenterologist if you have not heard about the biopsies in 3 weeks.  Our staff will call the home number listed on your records the next business day following your procedure to check on you and address any questions or concerns that you may have at that time regarding the information given to you following your procedure. This is a courtesy call and so if there is no answer at the home number and we have not heard from you through the emergency physician on call, we will assume that you have returned to your regular daily activities without incident.  SIGNATURES/CONFIDENTIALITY: You and/or your care partner have signed paperwork which will be entered into your electronic medical record.  These signatures attest to the fact that that the information above on your After Visit Summary has  been reviewed and is understood.  Full responsibility of the confidentiality of this discharge information lies with you and/or your care-partner.  Read all handouts given to you by your recovery room nurse.

## 2014-03-28 ENCOUNTER — Ambulatory Visit (AMBULATORY_SURGERY_CENTER): Payer: Self-pay | Admitting: *Deleted

## 2014-03-28 ENCOUNTER — Telehealth: Payer: Self-pay

## 2014-03-28 VITALS — Ht 60.0 in | Wt 162.8 lb

## 2014-03-28 DIAGNOSIS — R1310 Dysphagia, unspecified: Secondary | ICD-10-CM

## 2014-03-28 NOTE — Progress Notes (Signed)
No allergies to eggs or soy. No problems with anesthesia.  No oxygen use  No diet drug use  

## 2014-03-28 NOTE — Telephone Encounter (Signed)
  Follow up Call-  Call back number 03/27/2014  Post procedure Call Back phone  # [phone] hm  Permission to leave phone message Yes     Patient questions:  Do you have a fever, pain , or abdominal swelling? No. Pain Score  0 *  Have you tolerated food without any problems? Yes.    Have you been able to return to your normal activities? Yes.    Do you have any questions about your discharge instructions: Diet   No. Medications  No. Follow up visit  No.  Do you have questions or concerns about your Care? No.  Actions: * If pain score is 4 or above: No action needed, pain <4.

## 2014-04-02 ENCOUNTER — Encounter: Payer: Self-pay | Admitting: Internal Medicine

## 2014-04-02 ENCOUNTER — Ambulatory Visit (AMBULATORY_SURGERY_CENTER): Payer: Medicare Other | Admitting: Internal Medicine

## 2014-04-02 VITALS — BP 183/119 | HR 68 | Temp 97.2°F | Resp 23 | Ht 60.0 in | Wt 162.0 lb

## 2014-04-02 DIAGNOSIS — E669 Obesity, unspecified: Secondary | ICD-10-CM | POA: Diagnosis not present

## 2014-04-02 DIAGNOSIS — I10 Essential (primary) hypertension: Secondary | ICD-10-CM | POA: Diagnosis not present

## 2014-04-02 DIAGNOSIS — R1310 Dysphagia, unspecified: Secondary | ICD-10-CM | POA: Diagnosis not present

## 2014-04-02 DIAGNOSIS — K2100 Gastro-esophageal reflux disease with esophagitis, without bleeding: Secondary | ICD-10-CM

## 2014-04-02 DIAGNOSIS — K222 Esophageal obstruction: Secondary | ICD-10-CM

## 2014-04-02 MED ORDER — SODIUM CHLORIDE 0.9 % IV SOLN: 500.0000 mL | INTRAVENOUS | Status: DC

## 2014-04-02 MED ORDER — OMEPRAZOLE 20 MG PO CPDR: 20.0000 mg | DELAYED_RELEASE_CAPSULE | Freq: Every day | ORAL | Status: DC

## 2014-04-02 NOTE — Progress Notes (Signed)
Called to room to assist during endoscopic procedure.  Patient ID and intended procedure confirmed with present staff. Received instructions for my participation in the procedure from the performing physician.  

## 2014-04-02 NOTE — Progress Notes (Signed)
PATIENT WITH WHITE COAT HTN, STATING ONCE SHE IS HOME HER BP WILL DECREASE IMMEDIATLY. PATIENT WITH OUT SYMPTOMS, NO HEADACHE DIZZINESS OR WEAKNESS. CLEARED FOR DISCHARGE BY BIL WALTON, CRNA.

## 2014-04-02 NOTE — Op Note (Signed)
[place]  [address]. [place], [address]   ENDOSCOPY PROCEDURE REPORT  PATIENT: [name], [name]  MR#: [id number] BIRTHDATE: 1945/10/22 , 67  yrs. old GENDER: Female ENDOSCOPIST: Eustace Quail, MD REFERRED BY:  .  [employer] PROCEDURE DATE:  04/02/2014 PROCEDURE:  EGD, diagnostic and Maloney dilation of esophagus  - 37 French ASA CLASS:     Class II INDICATIONS:  Dysphagia. MEDICATIONS: MAC sedation, administered by CRNA and propofol (Diprivan) 150mg  IV TOPICAL ANESTHETIC: none  DESCRIPTION OF PROCEDURE: After the risks benefits and alternatives of the procedure were thoroughly explained, informed consent was obtained.  The LB [id number] endoscope was introduced through the mouth and advanced to the second portion of the duodenum. Without limitations.  The instrument was slowly withdrawn as the mucosa was fully examined.      EXAM:The esophagus revealed a 15 mm peptic stricture as well as active esophagitis (friability and edema of the mucosa) at the Z line.  No Barrett's esophagus.  The stomach was normal.  The duodenum was normal.  Retroflexed views revealed a hiatal hernia. The scope was then withdrawn from the patient and the procedure completed. THERAPY: 54 French Maloney dilator was passed into the esophagus blindly. No significant resistance or heme. Tolerated well  COMPLICATIONS: There were no complications. ENDOSCOPIC IMPRESSION: 1. Gastroesophageal reflux disease with endoscopic evidence of esophagitis 2. 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 Status post esophageal dilation.  RECOMMENDATIONS: 1.  Clear liquids until 4 PM, then soft foods rest of day.  Resume prior diet tomorrow. 2.  Prescribe omeprazole 20 mg by mouth daily; #30; 11 refills 3.  Call for office visit to be seen in about 8 weeks for followup  REPEAT EXAM:  eSigned:  Eustace Quail, MD 04/02/2014 2:15 PM   [id number] Loni Muse Asa Lente, MD and The  Patient

## 2014-04-02 NOTE — Patient Instructions (Signed)
  CALL DR. PERRY'S OFFICE TO SCHEDULE A FOLLOW UP  APPOINTMENT IN 8 WEEKS.  YOU HAD AN ENDOSCOPIC PROCEDURE TODAY AT [facility]: Refer to the procedure report that was given to you for any specific questions about what was found during the examination.  If the procedure report does not answer your questions, please call your gastroenterologist to clarify.  If you requested that your care partner not be given the details of your procedure findings, then the procedure report has been included in a sealed envelope for you to review at your convenience later.  YOU SHOULD EXPECT: Some feelings of bloating in the abdomen. Passage of more gas than usual.  Walking can help get rid of the air that was put into your GI tract during the procedure and reduce the bloating. If you had a lower endoscopy (such as a colonoscopy or flexible sigmoidoscopy) you may notice spotting of blood in your stool or on the toilet paper. If you underwent a bowel prep for your procedure, then you may not have a normal bowel movement for a few days.  DIET:  NOTHING TO EAT OR DRINK UNTIL 3 P.M. 3 P.M. UNTIL 4 P.M. ONLY CLEAR LIQUIDS  AFTER 4 PM ONLY SOFT FOODS. RESUME YOUR REGULAR DIET IN AM ACTIVITY: Your care partner should take you home directly after the procedure.  You should plan to take it easy, moving slowly for the rest of the day.  You can resume normal activity the day after the procedure however you should NOT DRIVE or use heavy machinery for 24 hours (because of the sedation medicines used during the test).    SYMPTOMS TO REPORT IMMEDIATELY: A gastroenterologist can be reached at any hour.  During normal business hours, 8:30 AM to 5:00 PM Monday through Friday, call [phone].  After hours and on weekends, please call the GI answering service at [phone] who will take a message and have the physician on call contact you.   Following upper endoscopy (EGD)  Vomiting of blood or coffee ground  material  New chest pain or pain under the shoulder blades  Painful or persistently difficult swallowing  New shortness of breath  Fever of 100F or higher  Black, tarry-looking stools  FOLLOW UP: If any biopsies were taken you will be contacted by phone or by letter within the next 1-3 weeks.  Call your gastroenterologist if you have not heard about the biopsies in 3 weeks.  Our staff will call the home number listed on your records the next business day following your procedure to check on you and address any questions or concerns that you may have at that time regarding the information given to you following your procedure. This is a courtesy call and so if there is no answer at the home number and we have not heard from you through the emergency physician on call, we will assume that you have returned to your regular daily activities without incident.  SIGNATURES/CONFIDENTIALITY: You and/or your care partner have signed paperwork which will be entered into your electronic medical record.  These signatures attest to the fact that that the information above on your After Visit Summary has been reviewed and is understood.  Full responsibility of the confidentiality of this discharge information lies with you and/or your care-partner.

## 2014-04-02 NOTE — Progress Notes (Signed)
Procedure ends, to recovery, report given and VSS. 

## 2014-04-03 ENCOUNTER — Telehealth: Payer: Self-pay | Admitting: *Deleted

## 2014-04-03 NOTE — Telephone Encounter (Signed)
  Follow up Call-  Call back number 04/02/2014 03/27/2014  Post procedure Call Back phone  # [phone] hm  Permission to leave phone message Yes Yes     Patient questions:  Do you have a fever, pain , or abdominal swelling? No. Pain Score  0 *  Have you tolerated food without any problems? Yes.    Have you been able to return to your normal activities? Yes.    Do you have any questions about your discharge instructions: Diet   No. Medications  No. Follow up visit  No.  Do you have questions or concerns about your Care? No.  Actions: * If pain score is 4 or above: No action needed, pain <4.

## 2014-04-16 ENCOUNTER — Encounter: Payer: Self-pay | Admitting: Internal Medicine

## 2014-05-01 ENCOUNTER — Telehealth: Payer: Self-pay

## 2014-05-02 MED ORDER — KETOCONAZOLE 2 % EX CREA: 1.0000 "application " | TOPICAL_CREAM | Freq: Two times a day (BID) | CUTANEOUS | Status: DC

## 2014-05-02 NOTE — Telephone Encounter (Signed)
[facility].../lmb 

## 2014-05-02 NOTE — Telephone Encounter (Signed)
ketocon 2% refill done

## 2014-05-04 DIAGNOSIS — Z23 Encounter for immunization: Secondary | ICD-10-CM | POA: Diagnosis not present

## 2014-05-07 ENCOUNTER — Telehealth: Payer: Self-pay

## 2014-05-07 NOTE — Telephone Encounter (Signed)
Flu documentation

## 2014-05-22 DIAGNOSIS — H903 Sensorineural hearing loss, bilateral: Secondary | ICD-10-CM | POA: Diagnosis not present

## 2014-05-28 ENCOUNTER — Encounter: Payer: Self-pay | Admitting: Internal Medicine

## 2014-05-28 ENCOUNTER — Ambulatory Visit (INDEPENDENT_AMBULATORY_CARE_PROVIDER_SITE_OTHER): Payer: Medicare Other | Admitting: Internal Medicine

## 2014-05-28 VITALS — BP 162/106 | HR 100 | Ht 60.0 in | Wt 163.6 lb

## 2014-05-28 DIAGNOSIS — K2100 Gastro-esophageal reflux disease with esophagitis, without bleeding: Secondary | ICD-10-CM

## 2014-05-28 DIAGNOSIS — K222 Esophageal obstruction: Secondary | ICD-10-CM | POA: Diagnosis not present

## 2014-05-28 NOTE — Patient Instructions (Signed)
Please follow up with Dr. Perry in one year 

## 2014-05-28 NOTE — Progress Notes (Signed)
HISTORY OF PRESENT ILLNESS:  Tiffany [name] is a 68 y.o. female who presents today for follow-up regarding GERD and symptomatic peptic stricture. The patient underwent surveillance colonoscopy (03/27/2014) and diagnostic upper endoscopy (for dysphagia and significant regurgitation) 04/02/2014. Colonoscopy revealed moderate diverticulosis but was otherwise normal. Upper endoscopy revealed esophagitis and peptic stricture without Barrett's. The esophagus was dilated with 54 [place] Maloney dilator. The patient was placed on omeprazole 20 mg daily. She presents today for follow-up. She is pleased report that she has had no further problems with regurgitation or vomiting. Dysphagia has resolved. She is tolerating the medication without issues. No new complaints. I reviewed both her procedures, findings, and recommendations in detail.  REVIEW OF SYSTEMS:  All non-GI ROS negative upon review  Past Medical History  Diagnosis Date  . Obesity, unspecified   . URINARY INCONTINENCE 04-20-12    occ. with nighttime sleep pattern  . SYNCOPE, HX OF   . BENIGN POSITIONAL VERTIGO   . GERD   . DYSLIPIDEMIA   . Raynaud disease   . Complication of anesthesia 04-20-12    some issues with prolonged sedation after anesthesia  . Arthritis 04-20-12    Osteoarthritis-right hip  . HYPERTENSION     severe, pt intol of med tx, Pt. has severe "whitecoat" syndrome  . Osteopenia   . Cataract   . Esophageal stricture   . Diverticulosis     Past Surgical History  Procedure Laterality Date  . Nasal fracture surgery  2006  . Tubal ligation  1980  . Cholecystectomy      '90-"sludge"  . Total hip arthroplasty  04/26/2012    Procedure: TOTAL HIP ARTHROPLASTY ANTERIOR APPROACH;  Surgeon: Mauri Pole, MD;  Location: [facility];  Service: Orthopedics;  Laterality: Right;  . Breast ultrasound Right 09/13/13    There is no sonographic evidence of malignancy. the [id number] complicated cyst in (R) breast is consistent with a benign  finding. repeat in 1 year    Social History AVREE HEINERT  reports that she has never smoked. She has never used smokeless tobacco. She reports that she drinks about 1.2 ounces of alcohol per week. She reports that she does not use illicit drugs.  family history is not on file. She was adopted.  Allergies  Allergen Reactions  . Penicillins Other (See Comments)    dry mouth  . Statins     "Pain, weakness and kidney problems"       PHYSICAL EXAMINATION: Vital signs: BP 162/106  Pulse 100  Ht 5' (1.524 m)  Wt 163 lb 9.6 oz (74.208 kg)  BMI 31.95 kg/m2 General: Well-developed, well-nourished, no acute distress Abdomen: Not reexamined. Psychiatric: alert and oriented x3. Cooperative   ASSESSMENT:  #1. GERD consultative a peptic stricture. A symptomatic post-dilation on PPI #2. History of adenomatous colon polyps. Recent colonoscopy negative for neoplasia   PLAN:  #1. Reflux precautions #2. Continue omeprazole 20 mg daily #3. Routine office follow-up in 1 year. Sooner if needed #4. Surveillance colonoscopy in 10 years

## 2014-05-31 ENCOUNTER — Other Ambulatory Visit: Payer: Self-pay | Admitting: Internal Medicine

## 2014-05-31 DIAGNOSIS — H903 Sensorineural hearing loss, bilateral: Secondary | ICD-10-CM | POA: Diagnosis not present

## 2014-05-31 NOTE — Telephone Encounter (Signed)
Does not appear to have been rxed since 2013 so will authorize 10 pills until PCP can address.

## 2014-06-11 ENCOUNTER — Ambulatory Visit (INDEPENDENT_AMBULATORY_CARE_PROVIDER_SITE_OTHER): Payer: Medicare Other | Admitting: Internal Medicine

## 2014-06-11 ENCOUNTER — Encounter: Payer: Self-pay | Admitting: Internal Medicine

## 2014-06-11 VITALS — BP 158/110 | HR 107 | Temp 97.5°F | Ht 60.0 in | Wt 164.0 lb

## 2014-06-11 DIAGNOSIS — B372 Candidiasis of skin and nail: Secondary | ICD-10-CM | POA: Diagnosis not present

## 2014-06-11 MED ORDER — CLOTRIMAZOLE 1 % EX CREA: 1.0000 "application " | TOPICAL_CREAM | Freq: Two times a day (BID) | CUTANEOUS | Status: DC

## 2014-06-11 NOTE — Progress Notes (Signed)
Pre visit review using our clinic review tool, if applicable. No additional management support is needed unless otherwise documented below in the visit note. 

## 2014-06-11 NOTE — Progress Notes (Signed)
   Subjective:    Patient ID: Tiffany [name], female    DOB: 1945/11/27, 68 y.o.   MRN: [id number]  HPI  Patient is here for follow up -continued rash at breasts and groin   Past Medical History  Diagnosis Date  . Obesity, unspecified   . URINARY INCONTINENCE 04-20-12    occ. with nighttime sleep pattern  . SYNCOPE, HX OF   . BENIGN POSITIONAL VERTIGO   . GERD   . DYSLIPIDEMIA   . Raynaud disease   . Complication of anesthesia 04-20-12    some issues with prolonged sedation after anesthesia  . Arthritis 04-20-12    Osteoarthritis-right hip  . HYPERTENSION     severe, pt intol of med tx, Pt. has severe "whitecoat" syndrome  . Osteopenia   . Cataract   . Esophageal stricture   . Diverticulosis     Review of Systems  Constitutional: Negative for fever and fatigue.  Respiratory: Negative for cough and shortness of breath.   Cardiovascular: Negative for chest pain and leg swelling.  Skin: Positive for rash (under breasts, between breasts, under abd panus and B groin folds).       Objective:   Physical Exam  BP 158/110 mmHg  Pulse 107  Temp(Src) 97.5 F (36.4 C) (Oral)  Ht 5' (1.524 m)  Wt 164 lb (74.39 kg)  BMI 32.03 kg/m2  SpO2 94% Wt Readings from Last 3 Encounters:  06/11/14 164 lb (74.39 kg)  05/28/14 163 lb 9.6 oz (74.208 kg)  04/02/14 162 lb (73.483 kg)   Constitutional: She appears well-developed and well-nourished. No distress.  Neck: Normal range of motion. Neck supple. No JVD present. No thyromegaly present.  Cardiovascular: Normal rate, regular rhythm and normal heart sounds.  No murmur heard. No BLE edema. Pulmonary/Chest: Effort normal and breath sounds normal. No respiratory distress. She has no wheezes.  Skin: candidiasis changes beneath breasts L>R, between breasts, abd panus fold and B groin folds - satellite red lesions Psychiatric: She has a normal mood and affect. Her behavior is normal. Judgment and thought content normal.   Lab Results    Component Value Date   WBC 6.8 10/11/2013   HGB 14.2 10/11/2013   HCT 41.8 10/11/2013   PLT 289.0 10/11/2013   GLUCOSE 91 10/11/2013   CHOL 214* 10/11/2013   TRIG 157.0* 10/11/2013   HDL 54.40 10/11/2013   LDLCALC 128* 10/11/2013   ALT 12 10/11/2013   AST 16 10/11/2013   NA 140 10/11/2013   K 4.0 10/11/2013   CL 107 10/11/2013   CREATININE 1.2 10/11/2013   BUN 16 10/11/2013   CO2 27 10/11/2013   TSH 4.62 10/11/2013   INR 0.98 04/20/2012   HGBA1C 5.2 12/12/2013    Dg Bone Density  09/20/2013   Findings : lowest T score - 2.1 @  femoral neck Diagnosis:  Osteopenia See Recommendations      Assessment & Plan:   Persisting, recurrent candidiasis - prior OV for same and treatment regimens reviewed including Diflucan course x 10d x 2, topical nystatin powder and cream ketoconazole - all ineffective relief  Start topical clotrimazole and refer to derm Note NO DM with normal a1c 12/2013

## 2014-06-11 NOTE — Patient Instructions (Addendum)
It was good to see you today.  We have reviewed your prior records including labs and tests today  Medications reviewed and updated, use new antifungal cream for your rash - no other changes recommended at this time.  we'll make referral to dermatologist. Our office will contact you regarding appointment(s) once made.  Please keep schedule followup in spring for annual exam and labs, call sooner if problems.  Candida Infection A Candida infection (also called yeast, fungus, and Monilia infection) is an overgrowth of yeast that can occur anywhere on the body. A yeast infection commonly occurs in warm, moist body areas. Usually, the infection remains localized but can spread to become a systemic infection. A yeast infection may be a sign of a more severe disease such as diabetes, leukemia, or AIDS. A yeast infection can occur in both men and women. In women, Candida vaginitis is a vaginal infection. It is one of the most common causes of vaginitis. Men usually do not have symptoms or know they have an infection until other problems develop. Men may find out they have a yeast infection because their sex partner has a yeast infection. Uncircumcised men are more likely to get a yeast infection than circumcised men. This is because the uncircumcised glans is not exposed to air and does not remain as dry as that of a circumcised glans. Older adults may develop yeast infections around dentures. CAUSES  Women  Antibiotics.  Steroid medication taken for a long time.  Being overweight (obese).  Diabetes.  Poor immune condition.  Certain serious medical conditions.  Immune suppressive medications for organ transplant patients.  Chemotherapy.  Pregnancy.  Menstruation.  Stress and fatigue.  Intravenous drug use.  Oral contraceptives.  Wearing tight-fitting clothes in the crotch area.  Catching it from a sex partner who has a yeast infection.  Spermicide.  Intravenous, urinary, or  other catheters. Men  Catching it from a sex partner who has a yeast infection.  Having oral or anal sex with a person who has the infection.  Spermicide.  Diabetes.  Antibiotics.  Poor immune system.  Medications that suppress the immune system.  Intravenous drug use.  Intravenous, urinary, or other catheters. SYMPTOMS  Women  Thick, white vaginal discharge.  Vaginal itching.  Redness and swelling in and around the vagina.  Irritation of the lips of the vagina and perineum.  Blisters on the vaginal lips and perineum.  Painful sexual intercourse.  Low blood sugar (hypoglycemia).  Painful urination.  Bladder infections.  Intestinal problems such as constipation, indigestion, bad breath, bloating, increase in gas, diarrhea, or loose stools. Men  Men may develop intestinal problems such as constipation, indigestion, bad breath, bloating, increase in gas, diarrhea, or loose stools.  Dry, cracked skin on the penis with itching or discomfort.  Jock itch.  Dry, flaky skin.  Athlete's foot.  Hypoglycemia. DIAGNOSIS  Women  A history and an exam are performed.  The discharge may be examined under a microscope.  A culture may be taken of the discharge. Men  A history and an exam are performed.  Any discharge from the penis or areas of cracked skin will be looked at under the microscope and cultured.  Stool samples may be cultured. TREATMENT  Women  Vaginal antifungal suppositories and creams.  Medicated creams to decrease irritation and itching on the outside of the vagina.  Warm compresses to the perineal area to decrease swelling and discomfort.  Oral antifungal medications.  Medicated vaginal suppositories or cream for  repeated or recurrent infections.  Wash and dry the irritation areas before applying the cream.  Eating yogurt with Lactobacillus may help with prevention and treatment.  Sometimes painting the vagina with gentian violet  solution may help if creams and suppositories do not work. Men  Antifungal creams and oral antifungal medications.  Sometimes treatment must continue for 30 days after the symptoms go away to prevent recurrence. HOME CARE INSTRUCTIONS  Women  Use cotton underwear and avoid tight-fitting clothing.  Avoid colored, scented toilet paper and deodorant tampons or pads.  Do not douche.  Keep your diabetes under control.  Finish all the prescribed medications.  Keep your skin clean and dry.  Consume milk or yogurt with Lactobacillus-active culture regularly. If you get frequent yeast infections and think that is what the infection is, there are over-the-counter medications that you can get. If the infection does not show healing in 3 days, talk to your caregiver.  Tell your sex partner you have a yeast infection. Your partner may need treatment also, especially if your infection does not clear up or recurs. Men  Keep your skin clean and dry.  Keep your diabetes under control.  Finish all prescribed medications.  Tell your sex partner that you have a yeast infection so he or she can be treated if necessary. SEEK MEDICAL CARE IF:   Your symptoms do not clear up or worsen in one week after treatment.  You have an oral temperature above 102 F (38.9 C).  You have trouble swallowing or eating for a prolonged time.  You develop blisters on and around your vagina.  You develop vaginal bleeding and it is not your menstrual period.  You develop abdominal pain.  You develop intestinal problems as mentioned above.  You get weak or light-headed.  You have painful or increased urination.  You have pain during sexual intercourse. MAKE SURE YOU:   Understand these instructions.  Will watch your condition.  Will get help right away if you are not doing well or get worse. Document Released: 08/27/2004 Document Revised: 12/04/2013 Document Reviewed: 12/09/2009 [facility] Patient  Information 2015 [place], [place]. This information is not intended to replace advice given to you by your health care provider. Make sure you discuss any questions you have with your health care provider.

## 2014-07-13 DIAGNOSIS — L304 Erythema intertrigo: Secondary | ICD-10-CM | POA: Diagnosis not present

## 2014-09-06 DIAGNOSIS — L304 Erythema intertrigo: Secondary | ICD-10-CM | POA: Diagnosis not present

## 2014-09-11 DIAGNOSIS — H2513 Age-related nuclear cataract, bilateral: Secondary | ICD-10-CM | POA: Diagnosis not present

## 2014-09-19 DIAGNOSIS — Z1231 Encounter for screening mammogram for malignant neoplasm of breast: Secondary | ICD-10-CM | POA: Diagnosis not present

## 2014-09-19 LAB — HM MAMMOGRAPHY: HM Mammogram: NEGATIVE

## 2014-09-20 ENCOUNTER — Encounter: Payer: Self-pay | Admitting: Internal Medicine

## 2014-09-26 ENCOUNTER — Ambulatory Visit (INDEPENDENT_AMBULATORY_CARE_PROVIDER_SITE_OTHER): Payer: Medicare Other | Admitting: Internal Medicine

## 2014-09-26 ENCOUNTER — Encounter: Payer: Self-pay | Admitting: Internal Medicine

## 2014-09-26 ENCOUNTER — Encounter: Payer: Medicare Other | Admitting: Internal Medicine

## 2014-09-26 VITALS — BP 142/92 | HR 96 | Temp 97.4°F | Resp 18 | Ht 60.5 in | Wt 163.0 lb

## 2014-09-26 DIAGNOSIS — K2100 Gastro-esophageal reflux disease with esophagitis, without bleeding: Secondary | ICD-10-CM

## 2014-09-26 DIAGNOSIS — R030 Elevated blood-pressure reading, without diagnosis of hypertension: Secondary | ICD-10-CM

## 2014-09-26 DIAGNOSIS — Z0000 Encounter for general adult medical examination without abnormal findings: Secondary | ICD-10-CM | POA: Diagnosis not present

## 2014-09-26 DIAGNOSIS — M8580 Other specified disorders of bone density and structure, unspecified site: Secondary | ICD-10-CM

## 2014-09-26 DIAGNOSIS — E785 Hyperlipidemia, unspecified: Secondary | ICD-10-CM

## 2014-09-26 DIAGNOSIS — IMO0001 Reserved for inherently not codable concepts without codable children: Secondary | ICD-10-CM

## 2014-09-26 MED ORDER — FLUCONAZOLE 150 MG PO TABS: 150.0000 mg | ORAL_TABLET | ORAL | Status: DC

## 2014-09-26 MED ORDER — MICONAZOLE NITRATE 2 % EX POWD: CUTANEOUS | Status: DC | PRN

## 2014-09-26 MED ORDER — ESTRADIOL 0.1 MG/GM VA CREA
1.0000 | TOPICAL_CREAM | VAGINAL | Status: DC
Start: 2014-09-26 — End: 2015-01-29

## 2014-09-26 MED ORDER — FAMOTIDINE 10 MG PO TABS: 10.0000 mg | ORAL_TABLET | Freq: Two times a day (BID) | ORAL | Status: DC | PRN

## 2014-09-26 NOTE — Progress Notes (Signed)
Pre visit review using our clinic review tool, if applicable. No additional management support is needed unless otherwise documented below in the visit note. 

## 2014-09-26 NOTE — Patient Instructions (Addendum)
It was good to see you today.  We have reviewed your prior records including labs and tests today  Health Maintenance reviewed - all recommended immunizations and age-appropriate screenings are up-to-date.  Test(s) ordered today. Please return when you are fasting. Your results will be released to [facility] (or called to you) after review, usually within 72hours after test completion. If any changes need to be made, you will be notified at that same time.  Medications reviewed and updated, no changes recommended at this time. Refill on medication(s) as discussed today.  Please schedule followup in 12 months for annual exam and labs, call sooner if problems.  Health Maintenance Adopting a healthy lifestyle and getting preventive care can go a long way to promote health and wellness. Talk with your health care provider about what schedule of regular examinations is right for you. This is a good chance for you to check in with your provider about disease prevention and staying healthy. In between checkups, there are plenty of things you can do on your own. Experts have done a lot of research about which lifestyle changes and preventive measures are most likely to keep you healthy. Ask your health care provider for more information. WEIGHT AND DIET  Eat a healthy diet  Be sure to include plenty of vegetables, fruits, low-fat dairy products, and lean protein.  Do not eat a lot of foods high in solid fats, added sugars, or salt.  Get regular exercise. This is one of the most important things you can do for your health.  Most adults should exercise for at least 150 minutes each week. The exercise should increase your heart rate and make you sweat (moderate-intensity exercise).  Most adults should also do strengthening exercises at least twice a week. This is in addition to the moderate-intensity exercise.  Maintain a healthy weight  Body mass index (BMI) is a measurement that can be used to  identify possible weight problems. It estimates body fat based on height and weight. Your health care provider can help determine your BMI and help you achieve or maintain a healthy weight.  For females 62 years of age and older:   A BMI below 18.5 is considered underweight.  A BMI of 18.5 to 24.9 is normal.  A BMI of 25 to 29.9 is considered overweight.  A BMI of 30 and above is considered obese.  Watch levels of cholesterol and blood lipids  You should start having your blood tested for lipids and cholesterol at 71 years of age, then have this test every 5 years.  You may need to have your cholesterol levels checked more often if:  Your lipid or cholesterol levels are high.  You are older than 71 years of age.  You are at high risk for heart disease.  CANCER SCREENING   Lung Cancer  Lung cancer screening is recommended for adults 49-12 years old who are at high risk for lung cancer because of a history of smoking.  A yearly low-dose CT scan of the lungs is recommended for people who:  Currently smoke.  Have quit within the past 15 years.  Have at least a 30-pack-year history of smoking. A pack year is smoking an average of one pack of cigarettes a day for 1 year.  Yearly screening should continue until it has been 15 years since you quit.  Yearly screening should stop if you develop a health problem that would prevent you from having lung cancer treatment.  Breast Cancer  Practice breast self-awareness. This means understanding how your breasts normally appear and feel.  It also means doing regular breast self-exams. Let your health care provider know about any changes, no matter how small.  If you are in your 20s or 30s, you should have a clinical breast exam (CBE) by a health care provider every 1-3 years as part of a regular health exam.  If you are 22 or older, have a CBE every year. Also consider having a breast X-ray (mammogram) every year.  If you have a  family history of breast cancer, talk to your health care provider about genetic screening.  If you are at high risk for breast cancer, talk to your health care provider about having an MRI and a mammogram every year.  Breast cancer gene (BRCA) assessment is recommended for women who have family members with BRCA-related cancers. BRCA-related cancers include:  Breast.  Ovarian.  Tubal.  Peritoneal cancers.  Results of the assessment will determine the need for genetic counseling and BRCA1 and BRCA2 testing. Cervical Cancer Routine pelvic examinations to screen for cervical cancer are no longer recommended for nonpregnant women who are considered low risk for cancer of the pelvic organs (ovaries, uterus, and vagina) and who do not have symptoms. A pelvic examination may be necessary if you have symptoms including those associated with pelvic infections. Ask your health care provider if a screening pelvic exam is right for you.   The Pap test is the screening test for cervical cancer for women who are considered at risk.  If you had a hysterectomy for a problem that was not cancer or a condition that could lead to cancer, then you no longer need Pap tests.  If you are older than 65 years, and you have had normal Pap tests for the past 10 years, you no longer need to have Pap tests.  If you have had past treatment for cervical cancer or a condition that could lead to cancer, you need Pap tests and screening for cancer for at least 20 years after your treatment.  If you no longer get a Pap test, assess your risk factors if they change (such as having a new sexual partner). This can affect whether you should start being screened again.  Some women have medical problems that increase their chance of getting cervical cancer. If this is the case for you, your health care provider may recommend more frequent screening and Pap tests.  The human papillomavirus (HPV) test is another test that may  be used for cervical cancer screening. The HPV test looks for the virus that can cause cell changes in the cervix. The cells collected during the Pap test can be tested for HPV.  The HPV test can be used to screen women 84 years of age and older. Getting tested for HPV can extend the interval between normal Pap tests from three to five years.  An HPV test also should be used to screen women of any age who have unclear Pap test results.  After 71 years of age, women should have HPV testing as often as Pap tests.  Colorectal Cancer  This type of cancer can be detected and often prevented.  Routine colorectal cancer screening usually begins at 71 years of age and continues through 71 years of age.  Your health care provider may recommend screening at an earlier age if you have risk factors for colon cancer.  Your health care provider may also recommend using home test kits  to check for hidden blood in the stool.  A small camera at the end of a tube can be used to examine your colon directly (sigmoidoscopy or colonoscopy). This is done to check for the earliest forms of colorectal cancer.  Routine screening usually begins at age 6.  Direct examination of the colon should be repeated every 5-10 years through 71 years of age. However, you may need to be screened more often if early forms of precancerous polyps or small growths are found. Skin Cancer  Check your skin from head to toe regularly.  Tell your health care provider about any new moles or changes in moles, especially if there is a change in a mole's shape or color.  Also tell your health care provider if you have a mole that is larger than the size of a pencil eraser.  Always use sunscreen. Apply sunscreen liberally and repeatedly throughout the day.  Protect yourself by wearing long sleeves, pants, a wide-brimmed hat, and sunglasses whenever you are outside. HEART DISEASE, DIABETES, AND HIGH BLOOD PRESSURE   Have your blood  pressure checked at least every 1-2 years. High blood pressure causes heart disease and increases the risk of stroke.  If you are between 27 years and 82 years old, ask your health care provider if you should take aspirin to prevent strokes.  Have regular diabetes screenings. This involves taking a blood sample to check your fasting blood sugar level.  If you are at a normal weight and have a low risk for diabetes, have this test once every three years after 71 years of age.  If you are overweight and have a high risk for diabetes, consider being tested at a younger age or more often. PREVENTING INFECTION  Hepatitis B  If you have a higher risk for hepatitis B, you should be screened for this virus. You are considered at high risk for hepatitis B if:  You were born in a country where hepatitis B is common. Ask your health care provider which countries are considered high risk.  Your parents were born in a high-risk country, and you have not been immunized against hepatitis B (hepatitis B vaccine).  You have HIV or AIDS.  You use needles to inject street drugs.  You live with someone who has hepatitis B.  You have had sex with someone who has hepatitis B.  You get hemodialysis treatment.  You take certain medicines for conditions, including cancer, organ transplantation, and autoimmune conditions. Hepatitis C  Blood testing is recommended for:  Everyone born from [birth year] through 1967.  Anyone with known risk factors for hepatitis C. Sexually transmitted infections (STIs)  You should be screened for sexually transmitted infections (STIs) including gonorrhea and chlamydia if:  You are sexually active and are younger than 71 years of age.  You are older than 71 years of age and your health care provider tells you that you are at risk for this type of infection.  Your sexual activity has changed since you were last screened and you are at an increased risk for chlamydia or  gonorrhea. Ask your health care provider if you are at risk.  If you do not have HIV, but are at risk, it may be recommended that you take a prescription medicine daily to prevent HIV infection. This is called pre-exposure prophylaxis (PrEP). You are considered at risk if:  You are sexually active and do not regularly use condoms or know the HIV status of your partner(s).  You  take drugs by injection.  You are sexually active with a partner who has HIV. Talk with your health care provider about whether you are at high risk of being infected with HIV. If you choose to begin PrEP, you should first be tested for HIV. You should then be tested every 3 months for as long as you are taking PrEP.  PREGNANCY   If you are premenopausal and you may become pregnant, ask your health care provider about preconception counseling.  If you may become pregnant, take 400 to 800 micrograms (mcg) of folic acid every day.  If you want to prevent pregnancy, talk to your health care provider about birth control (contraception). OSTEOPOROSIS AND MENOPAUSE   Osteoporosis is a disease in which the bones lose minerals and strength with aging. This can result in serious bone fractures. Your risk for osteoporosis can be identified using a bone density scan.  If you are 17 years of age or older, or if you are at risk for osteoporosis and fractures, ask your health care provider if you should be screened.  Ask your health care provider whether you should take a calcium or vitamin D supplement to lower your risk for osteoporosis.  Menopause may have certain physical symptoms and risks.  Hormone replacement therapy may reduce some of these symptoms and risks. Talk to your health care provider about whether hormone replacement therapy is right for you.  HOME CARE INSTRUCTIONS   Schedule regular health, dental, and eye exams.  Stay current with your immunizations.   Do not use any tobacco products including  cigarettes, chewing tobacco, or electronic cigarettes.  If you are pregnant, do not drink alcohol.  If you are breastfeeding, limit how much and how often you drink alcohol.  Limit alcohol intake to no more than 1 drink per day for nonpregnant women. One drink equals 12 ounces of beer, 5 ounces of wine, or 1 ounces of hard liquor.  Do not use street drugs.  Do not share needles.  Ask your health care provider for help if you need support or information about quitting drugs.  Tell your health care provider if you often feel depressed.  Tell your health care provider if you have ever been abused or do not feel safe at home. Document Released: 02/02/2011 Document Revised: 12/04/2013 Document Reviewed: 06/21/2013 [facility] Patient Information 2015 [place], [place]. This information is not intended to replace advice given to you by your health care provider. Make sure you discuss any questions you have with your health care provider.

## 2014-09-26 NOTE — Assessment & Plan Note (Signed)
pt intol of many meds: muscle cramps on prior tx simvastin, niaspan, welchol reviewed taking fish oil without problems remains active - reports "good ratio" and does not feel need for med tx The patient is asked to make an attempt to improve diet and exercise patterns to aid in medical management of this problem.

## 2014-09-26 NOTE — Assessment & Plan Note (Signed)
Severe "white coat" per pt - home BP under improved control with prn hctz only Home SBP 130s, reviewed home readings Prev tx with intol of multiple meds - lisinopril+diuretic, benicar, diovan, benicar, norvasc, atenolol - currently on no treatment and feels well - no chest pain, edema or dizziness -  Pt declines any rx tx due to these factors - advised to call if home BP readings consistently >140s/90s and pt agrees s/p 24h monitor BP from cards 10/2011 and 21d BP monitor 11/2011  S/p cards eval spring 2013 - no evidence of cardiac abnormality working on control of anxiety to manage same - diazepam prn - refill as needed  BP Readings from Last 3 Encounters:  09/26/14 142/92  06/11/14 158/110  05/28/14 162/106

## 2014-09-26 NOTE — Assessment & Plan Note (Signed)
Reviewed results of last bone density - wishes not to take PPI daily because of same - see GERD above Patient to continue over-the-counter calcium and vitamin D, recommended doses reviewed in depth Patient will also continue weightbearing activity, repeat test in 2 years, sooner if problems

## 2014-09-26 NOTE — Progress Notes (Signed)
Subjective:    Patient ID: Tiffany [name], female    DOB: 14-Jul-1946, 69 y.o.   MRN: [id number]  HPI   Here for medicare wellness  Diet: heart healthy  Physical activity: sedentary Depression/mood screen: negative Hearing: intact to whispered voice Visual acuity: grossly normal, performs annual eye exam  ADLs: capable Fall risk: none Home safety: good Cognitive evaluation: intact to orientation, naming, recall and repetition EOL planning: adv directives, full code/ I agree  I have personally reviewed and have noted 1. The patient's medical and social history 2. Their use of alcohol, tobacco or illicit drugs 3. Their current medications and supplements 4. The patient's functional ability including ADL's, fall risks, home safety risks and hearing or visual impairment. 5. Diet and physical activities 6. Evidence for depression or mood disorders  Also reviewed interval event and medical concerns  Past Medical History  Diagnosis Date  . Obesity, unspecified   . URINARY INCONTINENCE 04-20-12    occ. with nighttime sleep pattern  . SYNCOPE, HX OF   . BENIGN POSITIONAL VERTIGO   . GERD   . DYSLIPIDEMIA   . Raynaud disease   . Complication of anesthesia 04-20-12    some issues with prolonged sedation after anesthesia  . Arthritis 04-20-12    Osteoarthritis-right hip  . HYPERTENSION     severe, pt intol of med tx, Pt. has severe "whitecoat" syndrome  . Osteopenia   . Cataract   . Esophageal stricture   . Diverticulosis    Family History  Problem Relation Age of Onset  . Adopted: Yes   History  Substance Use Topics  . Smoking status: Never Smoker   . Smokeless tobacco: Never Used  . Alcohol Use: 1.2 oz/week    2 Glasses of wine per week     Comment: several glasses wine weekly    Review of Systems  Constitutional: Negative for fatigue and unexpected weight change.  Respiratory: Negative for cough, shortness of breath and wheezing.   Cardiovascular: Negative for  chest pain, palpitations and leg swelling.  Gastrointestinal: Negative for nausea, abdominal pain and diarrhea.  Neurological: Negative for dizziness, weakness, light-headedness and headaches.  Psychiatric/Behavioral: Negative for dysphoric mood. The patient is not nervous/anxious.   All other systems reviewed and are negative.      Objective:    Physical Exam  Constitutional: She appears well-developed and well-nourished. No distress.  Cardiovascular: Normal rate, regular rhythm and normal heart sounds.   No murmur heard. Pulmonary/Chest: Effort normal and breath sounds normal. No respiratory distress.  Musculoskeletal: She exhibits no edema.    BP 142/92 mmHg  Pulse 96  Temp(Src) 97.4 F (36.3 C) (Oral)  Resp 18  Ht 5' 0.5" (1.537 m)  Wt 163 lb (73.936 kg)  BMI 31.30 kg/m2  SpO2 99% Wt Readings from Last 3 Encounters:  09/26/14 163 lb (73.936 kg)  06/11/14 164 lb (74.39 kg)  05/28/14 163 lb 9.6 oz (74.208 kg)    Lab Results  Component Value Date   WBC 6.8 10/11/2013   HGB 14.2 10/11/2013   HCT 41.8 10/11/2013   PLT 289.0 10/11/2013   GLUCOSE 91 10/11/2013   CHOL 214* 10/11/2013   TRIG 157.0* 10/11/2013   HDL 54.40 10/11/2013   LDLCALC 128* 10/11/2013   ALT 12 10/11/2013   AST 16 10/11/2013   NA 140 10/11/2013   K 4.0 10/11/2013   CL 107 10/11/2013   CREATININE 1.2 10/11/2013   BUN 16 10/11/2013   CO2 27 10/11/2013  TSH 4.62 10/11/2013   INR 0.98 04/20/2012   HGBA1C 5.2 12/12/2013    Dg Bone Density  09/20/2013   Findings : lowest T score - 2.1 @  femoral neck Diagnosis:  Osteopenia See Recommendations       Assessment & Plan:   Z00.00 - Today patient counseled on age appropriate routine health concerns for screening and prevention, each reviewed and up to date or declined. Immunizations reviewed and up to date or declined. Labs ordered and reviewed. Risk factors for depression reviewed and negative. Hearing function and visual acuity are intact. ADLs  screened and addressed as needed. Functional ability and level of safety reviewed and appropriate. Education, counseling and referrals performed based on assessed risks today. Patient provided with a copy of personalized plan for preventive services.  Problem List Items Addressed This Visit    GERD    symptoms exacerbated by esophageal stricture s/p dilation 03/2014 - improved Change PPI due to concern for progressive bone loss with chronic use - ok for H2B OTC prn      Relevant Medications   famotidine (PEPCID) tablet   Other Relevant Orders   CBC with Differential/Platelet   Hyperlipidemia    pt intol of many meds: muscle cramps on prior tx simvastin, niaspan, welchol reviewed taking fish oil without problems remains active - reports "good ratio" and does not feel need for med tx The patient is asked to make an attempt to improve diet and exercise patterns to aid in medical management of this problem.       Relevant Orders   Lipid panel   Osteopenia    Reviewed results of last bone density - wishes not to take PPI daily because of same - see GERD above Patient to continue over-the-counter calcium and vitamin D, recommended doses reviewed in depth Patient will also continue weightbearing activity, repeat test in 2 years, sooner if problems      Relevant Orders   TSH   White coat hypertension    Severe "white coat" per pt - home BP under improved control with prn hctz only Home SBP 130s, reviewed home readings Prev tx with intol of multiple meds - lisinopril+diuretic, benicar, diovan, benicar, norvasc, atenolol - currently on no treatment and feels well - no chest pain, edema or dizziness -  Pt declines any rx tx due to these factors - advised to call if home BP readings consistently >140s/90s and pt agrees s/p 24h monitor BP from cards 10/2011 and 21d BP monitor 11/2011  S/p cards eval spring 2013 - no evidence of cardiac abnormality working on control of anxiety to manage same -  diazepam prn - refill as needed  BP Readings from Last 3 Encounters:  09/26/14 142/92  06/11/14 158/110  05/28/14 162/106        Relevant Orders   Basic metabolic panel   Lipid panel   Urinalysis, Routine w reflex microscopic    Other Visit Diagnoses    Routine general medical examination at a health care facility    -  Primary        Gwendolyn Grant, MD

## 2014-09-26 NOTE — Assessment & Plan Note (Signed)
symptoms exacerbated by esophageal stricture s/p dilation 03/2014 - improved Change PPI due to concern for progressive bone loss with chronic use - ok for H2B OTC prn

## 2014-09-28 ENCOUNTER — Other Ambulatory Visit (INDEPENDENT_AMBULATORY_CARE_PROVIDER_SITE_OTHER): Payer: Medicare Other

## 2014-09-28 DIAGNOSIS — K2100 Gastro-esophageal reflux disease with esophagitis, without bleeding: Secondary | ICD-10-CM

## 2014-09-28 DIAGNOSIS — M8580 Other specified disorders of bone density and structure, unspecified site: Secondary | ICD-10-CM | POA: Diagnosis not present

## 2014-09-28 DIAGNOSIS — E785 Hyperlipidemia, unspecified: Secondary | ICD-10-CM

## 2014-09-28 DIAGNOSIS — IMO0001 Reserved for inherently not codable concepts without codable children: Secondary | ICD-10-CM

## 2014-09-28 DIAGNOSIS — R030 Elevated blood-pressure reading, without diagnosis of hypertension: Secondary | ICD-10-CM

## 2014-09-28 LAB — LIPID PANEL
Cholesterol: 237 mg/dL — ABNORMAL HIGH (ref 0–200)
HDL: 59 mg/dL (ref >39.00)
LDL Cholesterol: 148 mg/dL — ABNORMAL HIGH (ref 0–99)
NonHDL: 178
Total CHOL/HDL Ratio: 4
Triglycerides: 150 mg/dL — ABNORMAL HIGH (ref 0.0–149.0)
VLDL: 30 mg/dL (ref 0.0–40.0)

## 2014-09-28 LAB — URINALYSIS, ROUTINE W REFLEX MICROSCOPIC
Bilirubin Urine: NEGATIVE
Ketones, ur: NEGATIVE
Nitrite: NEGATIVE
Specific Gravity, Urine: 1.015 (ref 1.000–1.030)
Total Protein, Urine: NEGATIVE
Urine Glucose: NEGATIVE
Urobilinogen, UA: 0.2 (ref 0.0–1.0)
pH: 6 (ref 5.0–8.0)

## 2014-09-28 LAB — CBC WITH DIFFERENTIAL/PLATELET
Basophils Absolute: 0 10*3/uL (ref 0.0–0.1)
Basophils Relative: 0.4 % (ref 0.0–3.0)
Eosinophils Absolute: 0.2 10*3/uL (ref 0.0–0.7)
Eosinophils Relative: 3 % (ref 0.0–5.0)
HCT: 42.2 % (ref 36.0–46.0)
Hemoglobin: 14.5 g/dL (ref 12.0–15.0)
Lymphocytes Relative: 38.1 % (ref 12.0–46.0)
Lymphs Abs: 2 10*3/uL (ref 0.7–4.0)
MCHC: 34.4 g/dL (ref 30.0–36.0)
MCV: 87.7 fl (ref 78.0–100.0)
Monocytes Absolute: 0.4 10*3/uL (ref 0.1–1.0)
Monocytes Relative: 7.2 % (ref 3.0–12.0)
Neutro Abs: 2.7 10*3/uL (ref 1.4–7.7)
Neutrophils Relative %: 51.3 % (ref 43.0–77.0)
Platelets: 206 10*3/uL (ref 150.0–400.0)
RBC: 4.81 Mil/uL (ref 3.87–5.11)
RDW: 14.7 % (ref 11.5–15.5)
WBC: 5.2 10*3/uL (ref 4.0–10.5)

## 2014-09-28 LAB — BASIC METABOLIC PANEL
BUN: 15 mg/dL (ref 6–23)
CO2: 28 mEq/L (ref 19–32)
Calcium: 9.2 mg/dL (ref 8.4–10.5)
Chloride: 106 mEq/L (ref 96–112)
Creatinine, Ser: 1.2 mg/dL (ref 0.40–1.20)
GFR: 47.15 mL/min — ABNORMAL LOW (ref >60.00)
Glucose, Bld: 81 mg/dL (ref 70–99)
Potassium: 3.9 mEq/L (ref 3.5–5.1)
Sodium: 141 mEq/L (ref 135–145)

## 2014-09-28 LAB — TSH: TSH: 5.25 u[IU]/mL — ABNORMAL HIGH (ref 0.35–4.50)

## 2014-09-29 ENCOUNTER — Encounter: Payer: Self-pay | Admitting: Internal Medicine

## 2014-09-29 DIAGNOSIS — E039 Hypothyroidism, unspecified: Secondary | ICD-10-CM | POA: Insufficient documentation

## 2014-09-29 HISTORY — DX: Hypothyroidism, unspecified: E03.9

## 2014-10-02 ENCOUNTER — Other Ambulatory Visit: Payer: Self-pay

## 2014-10-02 MED ORDER — LEVOTHYROXINE SODIUM 50 MCG PO TABS: 50.0000 ug | ORAL_TABLET | Freq: Every day | ORAL | Status: DC

## 2014-11-22 ENCOUNTER — Other Ambulatory Visit: Payer: Self-pay | Admitting: Internal Medicine

## 2014-12-25 DIAGNOSIS — H18411 Arcus senilis, right eye: Secondary | ICD-10-CM | POA: Diagnosis not present

## 2014-12-25 DIAGNOSIS — H2512 Age-related nuclear cataract, left eye: Secondary | ICD-10-CM | POA: Diagnosis not present

## 2014-12-25 DIAGNOSIS — H18412 Arcus senilis, left eye: Secondary | ICD-10-CM | POA: Diagnosis not present

## 2014-12-25 DIAGNOSIS — H2511 Age-related nuclear cataract, right eye: Secondary | ICD-10-CM | POA: Diagnosis not present

## 2014-12-25 DIAGNOSIS — H02839 Dermatochalasis of unspecified eye, unspecified eyelid: Secondary | ICD-10-CM | POA: Diagnosis not present

## 2015-01-11 ENCOUNTER — Other Ambulatory Visit (INDEPENDENT_AMBULATORY_CARE_PROVIDER_SITE_OTHER): Payer: Medicare Other

## 2015-01-11 ENCOUNTER — Encounter: Payer: Self-pay | Admitting: Internal Medicine

## 2015-01-11 ENCOUNTER — Ambulatory Visit (INDEPENDENT_AMBULATORY_CARE_PROVIDER_SITE_OTHER): Payer: Medicare Other | Admitting: Internal Medicine

## 2015-01-11 VITALS — BP 148/92 | HR 94 | Temp 97.9°F | Resp 18 | Ht 60.0 in | Wt 160.1 lb

## 2015-01-11 DIAGNOSIS — E039 Hypothyroidism, unspecified: Secondary | ICD-10-CM | POA: Diagnosis not present

## 2015-01-11 DIAGNOSIS — K2100 Gastro-esophageal reflux disease with esophagitis, without bleeding: Secondary | ICD-10-CM

## 2015-01-11 LAB — TSH: TSH: 1.16 u[IU]/mL (ref 0.35–4.50)

## 2015-01-11 LAB — T4, FREE: Free T4: 1.26 ng/dL (ref 0.60–1.60)

## 2015-01-11 NOTE — Patient Instructions (Signed)
We will check the thyroid and call you back with the results.   You can take the pepcid everyday.   We would like to check an EKG during an episode to get more information.

## 2015-01-11 NOTE — Progress Notes (Signed)
   Subjective:    Patient ID: Tiffany [name], female    DOB: 05-08-1946, 69 y.o.   MRN: [id number]  HPI The patient is a 69 YO female who is coming in for follow up of starting thyroid medication. She has not noticed a benefit in energy levels but is having more frequent bowel movements and some increased bloating. Still having her episodes for which she saw cardiology however they are last longing now and she is concerned. Also wants to know if she should be taking pepcid everyday (since her doctor told her to stop using the omeprazole and her GI doctor had wanted her on that). No chest pains. No SOB.   Review of Systems  Constitutional: Positive for fatigue. Negative for chills, activity change, appetite change and unexpected weight change.  Respiratory: Negative.   Cardiovascular: Negative.   Gastrointestinal: Positive for diarrhea. Negative for abdominal pain, constipation and abdominal distention.  Endocrine: Negative for cold intolerance and heat intolerance.  Musculoskeletal: Negative.   Neurological: Negative.       Objective:   Physical Exam  Constitutional: She is oriented to person, place, and time. She appears well-developed and well-nourished.  HENT:  Head: Normocephalic and atraumatic.  Eyes: EOM are normal.  Neck: Normal range of motion.  Cardiovascular: Normal rate and regular rhythm.   Pulmonary/Chest: Effort normal and breath sounds normal.  Abdominal: Soft. She exhibits no distension. There is no tenderness. There is no rebound.  Musculoskeletal: She exhibits no edema.  Neurological: She is alert and oriented to person, place, and time. Coordination normal.  Skin: Skin is warm and dry.  Psychiatric: She has a normal mood and affect.   Filed Vitals:   01/11/15 1439  BP: 148/92  Pulse: 94  Temp: 97.9 F (36.6 C)  TempSrc: Oral  Resp: 18  Height: 5' (1.524 m)  Weight: 160 lb 1.9 oz (72.63 kg)      Assessment & Plan:

## 2015-01-11 NOTE — Assessment & Plan Note (Signed)
Advised that she can take the pepcid everyday although she likely needs it only for symptoms since she does get symptomatic GERD.

## 2015-01-11 NOTE — Assessment & Plan Note (Signed)
She has been started on synthroid 50 mcg end of February. At this time has possible side effect of frequent bowel movements and no change to her fatigue. Checking TSH and free T4 today. If free T4 in middle of range would try stopping since she has not gotten any benefit and TSH only 5 at diagnosis.

## 2015-01-11 NOTE — Progress Notes (Signed)
Pre visit review using our clinic review tool, if applicable. No additional management support is needed unless otherwise documented below in the visit note. 

## 2015-01-15 ENCOUNTER — Telehealth: Payer: Self-pay | Admitting: Internal Medicine

## 2015-01-15 NOTE — Telephone Encounter (Signed)
Pt called in said that the meds for her thyroid is not working.  She is more tired and dont understand why she was told to stop if it wasn't working.  She would like a call back from the nurse    Best number [phone]

## 2015-01-16 NOTE — Telephone Encounter (Signed)
Spoke with patient and she has many concerns. She needs to come in and see one either Dr. Doug Sou, or Dr. Asa Lente for thyroid questions and concerns about medications. Would you please call this patient and schedule an office visit for her? Thanks.

## 2015-01-16 NOTE — Telephone Encounter (Signed)
Spouse has called back in regards.  Is requesting call back this afternoon.

## 2015-01-16 NOTE — Telephone Encounter (Signed)
Appointment made

## 2015-01-24 ENCOUNTER — Ambulatory Visit (INDEPENDENT_AMBULATORY_CARE_PROVIDER_SITE_OTHER): Payer: Medicare Other | Admitting: Internal Medicine

## 2015-01-24 ENCOUNTER — Encounter: Payer: Self-pay | Admitting: Internal Medicine

## 2015-01-24 VITALS — BP 122/80 | HR 88 | Temp 97.8°F | Resp 20 | Ht 60.0 in | Wt 162.4 lb

## 2015-01-24 DIAGNOSIS — M7989 Other specified soft tissue disorders: Secondary | ICD-10-CM

## 2015-01-24 DIAGNOSIS — R0602 Shortness of breath: Secondary | ICD-10-CM | POA: Diagnosis not present

## 2015-01-24 DIAGNOSIS — E039 Hypothyroidism, unspecified: Secondary | ICD-10-CM

## 2015-01-24 NOTE — Patient Instructions (Addendum)
We would like you to come back before 9AM some morning to do some blood work to check for reasons for the leg swelling and problems with the adrenal gland. We do not know if this is a problem but I have included information about it.   Addison Disease Addison disease is a glandular (endocrine) or hormonal disorder. It is also called adrenal insufficiency. It affects about 1 in 100,000 people. It can affect men and women in all age groups. This disease occurs when the adrenal glands do not make enough of the hormone cortisol. In some cases, the adrenal glands also do not make the hormonealdosterone. Without the right levels of these hormones, your body cannot maintain critical life functions. The adrenal glands are located just above the kidneys. Cortisol is a steroid hormone. Its most important job is to help the body respond to stress. Cortisol also helps the body to:  Maintain blood pressure and heart (cardiovascular) function.  Slow the immune system's response to inflammation.  Control the use of proteins, carbohydrates (sugars), and fats.  Maintain a sense of well-being. CAUSES  A lack of cortisol can happen for different reasons.   Primary adrenal insufficiency is Addison disease. The adrenal glands do not produce enough, or any, cortisol. Usually, aldosterone is also not produced.  Secondary adrenal insufficiency. The pituitary gland may not make enough of a hormone called ACTH (adrenocorticotropin). This hormone causes the adrenal glands to produce cortisol. Usually, there is enough aldosterone. Primary Adrenal Insufficiency can be caused by:  Autoimmune disease. Your body can produce antibodies that attack its own organs (in this case, your adrenal glands). The reasons why this happens are not well understood at this time. Sometimes, other organs are also affected (the polyglandular autoimmune syndromes).  An infection of the adrenal glands. Possible causes include tuberculosis,  viruses (including HIV), and fungal infections. Secondary Adrenal Insufficiency This form is much more common than the primary form. It can be traced to a lack of ACTH. Without ACTH, the adrenal glands cannot make cortisol. Causes include:  Diseases of the pituitary gland. This is a small gland in the brain that controls many important body functions. The pituitary gland produces ACTH, among other hormones. Pituitary gland tumors, injury, or surgery can cause inadequate ACTH production, which causes inadequate cortisol production.  Medications:  Megestrol (used for cancer treatment and to stimulate appetite) and some pain medications can impair production of cortisol.  Use of cortisol medication (steroids) causes your adrenal glands to not produce cortisol. When you stop this medication, it can take time for the adrenal glands to start producing cortisol again. This can be a dangerous situation, requiring slowly reducing your cortisol medicine. SYMPTOMS  Symptoms of adrenal insufficiency normally begin slowly. Problems seen with the disease are:  Severe tiredness (fatigue).  Muscle weakness.  Loss of appetite.  Weight loss. About 50% of the time, the following symptoms occur:   Nausea, vomiting and diarrhea.  Drops in blood pressure.  Dizziness or fainting.  Darkening of the skin (with primary disease only).  Being easily angered (irritable).  Depression.  Salt craving.  Low blood sugar (hypoglycemia).  Irregular or no menstrual periods. The symptoms slowly get worse. They are often ignored until a stressful event like an illness or an accident occurs. This is called an Addisonian crisis, or acute adrenal insufficiency. Without treatment, an Addisonian crisis can cause death. Symptoms of an Addisonian crisis include:  Sudden, severe pain in the lower back, abdomen, or legs.  Severe  vomiting and diarrhea.  Dehydration.  Low blood pressure.  Loss of  consciousness. DIAGNOSIS  In its early stages, adrenal insufficiency can be hard to diagnose. Your caregiver will need to:  Review your medical history.  Review your symptoms, such as dark tanning of the skin.  Perform lab tests. Results will show if levels of cortisol are too low, and will help identify the cause. CT scan or MRI scan of the adrenal and pituitary glands may also be necessary. TREATMENT  With proper treatment, you can live a normal life with Addison disease or adrenal insufficiency.  Missing hormones need to be replaced in order to treat Addison disease. Cortisol is replaced with hydrocortisone tablets taken by mouth. Other forms of cortisol may also be used. Since cortisone levels normally are higher in the morning and lower in the evening, you may need different doses at different times of the day. Be sure to follow your instructions carefully.  If your body cannot maintain the right levels of salt (sodium) and fluids because of too little aldosterone, you will also be given a medication. This drug replaces aldosterone. Important points about treatment:  Any sudden (acute) illness can increase your body's need for cortisol. Surgery, or other stress on the body, can do the same. If you have Addison disease and you are ill or having surgery, you will need an increase in hormone medication to prevent an Addisonian crisis. Untreated, an Addisonian crisis can cause death.  If you are too ill to take your medication or you cannot keep it down, you must take medicine through a shot (injection). You or someone who lives with you will need to learn how to give you this injection. The shot will take the place of hydrocortisone. If you find it necessary to give yourself injectable medication, call your caregiver right away, or go to the nearest hospital emergency room. HOME CARE INSTRUCTIONS   Always carry an identification card stating your condition in case of an emergency. The card  should:  Alert emergency personnel about the need to inject 100 mg of cortisol if you are severely hurt or cannot respond.  Include your caregiver's name and phone number.  Include the name and number of your closest relative to contact.  When traveling, carry a needle, syringe, and an injectable form of cortisol for emergencies.  Know how to increase medication during periods of stress or mild colds.  Wear a warning bracelet or neck chain to alert emergency personnel. Many companies sell medical ID products.  Take your medications as prescribed. Do not stop your medications without medical supervision.  Learn about your condition. Ask your caregiver for further resources. This is a potentially dangerous, but easily managed illness. SEEK MEDICAL CARE IF:   You have Addison disease and you are in need of surgery.  You have Addison disease and you have an acute illness.  You have weakness, weight loss, or other unexplained symptoms. SEEK IMMEDIATE MEDICAL CARE IF:   You have symptoms of crisis:  Sudden, severe pain in the lower back, abdomen, or legs.  Severe vomiting and diarrhea.  Dehydration.  Low blood pressure.  Loss of consciousness.  You are experiencing severe:  Infections or other illness.  Vomiting.  Diarrhea. Document Released: 07/20/2005 Document Revised: 12/04/2013 Document Reviewed: 12/11/2008 [facility] Patient Information 2015 [place], [place]. This information is not intended to replace advice given to you by your health care provider. Make sure you discuss any questions you have with your health care provider.

## 2015-01-24 NOTE — Progress Notes (Signed)
Pre visit review using our clinic review tool, if applicable. No additional management support is needed unless otherwise documented below in the visit note. 

## 2015-01-25 ENCOUNTER — Other Ambulatory Visit: Payer: Self-pay | Admitting: Internal Medicine

## 2015-01-25 ENCOUNTER — Other Ambulatory Visit (INDEPENDENT_AMBULATORY_CARE_PROVIDER_SITE_OTHER): Payer: Medicare Other

## 2015-01-25 DIAGNOSIS — M7989 Other specified soft tissue disorders: Secondary | ICD-10-CM

## 2015-01-25 DIAGNOSIS — R0602 Shortness of breath: Secondary | ICD-10-CM | POA: Diagnosis not present

## 2015-01-25 DIAGNOSIS — R0600 Dyspnea, unspecified: Secondary | ICD-10-CM

## 2015-01-25 DIAGNOSIS — R7989 Other specified abnormal findings of blood chemistry: Secondary | ICD-10-CM

## 2015-01-25 LAB — BRAIN NATRIURETIC PEPTIDE: Pro B Natriuretic peptide (BNP): 940 pg/mL — ABNORMAL HIGH (ref 0.0–100.0)

## 2015-01-25 LAB — COMPREHENSIVE METABOLIC PANEL
ALT: 58 U/L — ABNORMAL HIGH (ref 0–35)
AST: 47 U/L — ABNORMAL HIGH (ref 0–37)
Albumin: 3.6 g/dL (ref 3.5–5.2)
Alkaline Phosphatase: 93 U/L (ref 39–117)
BUN: 23 mg/dL (ref 6–23)
CO2: 23 mEq/L (ref 19–32)
Calcium: 9 mg/dL (ref 8.4–10.5)
Chloride: 110 mEq/L (ref 96–112)
Creatinine, Ser: 1.57 mg/dL — ABNORMAL HIGH (ref 0.40–1.20)
GFR: 34.55 mL/min — ABNORMAL LOW (ref >60.00)
Glucose, Bld: 130 mg/dL — ABNORMAL HIGH (ref 70–99)
Potassium: 4 mEq/L (ref 3.5–5.1)
Sodium: 142 mEq/L (ref 135–145)
Total Bilirubin: 0.8 mg/dL (ref 0.2–1.2)
Total Protein: 6.5 g/dL (ref 6.0–8.3)

## 2015-01-25 LAB — CORTISOL: Cortisol, Plasma: 19.1 ug/dL

## 2015-01-25 MED ORDER — FUROSEMIDE 40 MG PO TABS: 40.0000 mg | ORAL_TABLET | Freq: Every day | ORAL | Status: DC

## 2015-01-25 NOTE — Progress Notes (Signed)
   Subjective:    Patient ID: Tiffany [name], female    DOB: 19-Feb-1946, 69 y.o.   MRN: [id number]  HPI The patient is a 69 YO female coming in to discuss why she was taken off thyroid medicine. After last visit she did stop her thyroid medicine. She has not noticed any difference. She is still feeling tired all the time (she was at last visit as well) which is stable. She is still having some loose stools that could happen 2-3 times per day. Wants to know when that will get better if from the thyroid medicine. No new complaints but some older complaints that we are not able to fully address.   Review of Systems  Constitutional: Positive for fatigue. Negative for chills, activity change, appetite change and unexpected weight change.  Respiratory: Negative.   Cardiovascular: Negative.   Gastrointestinal: Positive for diarrhea. Negative for abdominal pain, constipation and abdominal distention.  Endocrine: Negative for cold intolerance and heat intolerance.  Musculoskeletal: Negative.   Neurological: Negative.       Objective:   Physical Exam  Constitutional: She is oriented to person, place, and time. She appears well-developed and well-nourished.  HENT:  Head: Normocephalic and atraumatic.  Eyes: EOM are normal.  Neck: Normal range of motion.  Cardiovascular: Normal rate and regular rhythm.   Pulmonary/Chest: Effort normal and breath sounds normal.  Abdominal: Soft. She exhibits no distension. There is no tenderness. There is no rebound.  Musculoskeletal: She exhibits no edema.  Neurological: She is alert and oriented to person, place, and time. Coordination normal.  Skin: Skin is warm and dry.  Psychiatric: She has a normal mood and affect.   Filed Vitals:   01/24/15 1612  BP: 122/80  Pulse: 88  Temp: 97.8 F (36.6 C)  TempSrc: Oral  Resp: 20  Height: 5' (1.524 m)  Weight: 162 lb 6.4 oz (73.664 kg)      Assessment & Plan:

## 2015-01-25 NOTE — Assessment & Plan Note (Signed)
She has stopped taking her thyroid medicine due to recent check T4 well in midrange and possible side effects. Spent >10 minutes counseling her on why we stopped the medicine. She is okay with staying off the medicine and coming back in about 1 month for recheck T4 and TSH to see if she needs the medicine. Not too much change in bowel habits but informed her that this could take some time. Checking cortisol, BNP, CMP to make sure not missing another cause of her fatigue. She also has trace pedal edema on exam today.

## 2015-01-28 ENCOUNTER — Other Ambulatory Visit: Payer: Self-pay

## 2015-01-28 ENCOUNTER — Emergency Department (HOSPITAL_COMMUNITY): Payer: Medicare Other

## 2015-01-28 ENCOUNTER — Encounter (HOSPITAL_COMMUNITY): Payer: Self-pay | Admitting: Emergency Medicine

## 2015-01-28 ENCOUNTER — Observation Stay (HOSPITAL_COMMUNITY)
Admission: EM | Admit: 2015-01-28 | Discharge: 2015-01-31 | Disposition: A | Discharge status: Home / Self Care | Payer: Medicare Other | Attending: Cardiology | Admitting: Cardiology

## 2015-01-28 DIAGNOSIS — I129 Hypertensive chronic kidney disease with stage 1 through stage 4 chronic kidney disease, or unspecified chronic kidney disease: Secondary | ICD-10-CM | POA: Insufficient documentation

## 2015-01-28 DIAGNOSIS — R0602 Shortness of breath: Secondary | ICD-10-CM | POA: Diagnosis not present

## 2015-01-28 DIAGNOSIS — E785 Hyperlipidemia, unspecified: Secondary | ICD-10-CM | POA: Diagnosis not present

## 2015-01-28 DIAGNOSIS — I7300 Raynaud's syndrome without gangrene: Secondary | ICD-10-CM

## 2015-01-28 DIAGNOSIS — F419 Anxiety disorder, unspecified: Secondary | ICD-10-CM | POA: Diagnosis present

## 2015-01-28 DIAGNOSIS — I499 Cardiac arrhythmia, unspecified: Secondary | ICD-10-CM | POA: Diagnosis not present

## 2015-01-28 DIAGNOSIS — N182 Chronic kidney disease, stage 2 (mild): Secondary | ICD-10-CM | POA: Insufficient documentation

## 2015-01-28 DIAGNOSIS — E039 Hypothyroidism, unspecified: Secondary | ICD-10-CM | POA: Diagnosis not present

## 2015-01-28 DIAGNOSIS — R0600 Dyspnea, unspecified: Secondary | ICD-10-CM | POA: Insufficient documentation

## 2015-01-28 DIAGNOSIS — M341 CR(E)ST syndrome: Secondary | ICD-10-CM | POA: Diagnosis present

## 2015-01-28 DIAGNOSIS — K219 Gastro-esophageal reflux disease without esophagitis: Secondary | ICD-10-CM | POA: Insufficient documentation

## 2015-01-28 DIAGNOSIS — IMO0001 Reserved for inherently not codable concepts without codable children: Secondary | ICD-10-CM | POA: Diagnosis present

## 2015-01-28 DIAGNOSIS — I4891 Unspecified atrial fibrillation: Principal | ICD-10-CM | POA: Insufficient documentation

## 2015-01-28 DIAGNOSIS — N184 Chronic kidney disease, stage 4 (severe): Secondary | ICD-10-CM | POA: Diagnosis present

## 2015-01-28 DIAGNOSIS — E669 Obesity, unspecified: Secondary | ICD-10-CM | POA: Diagnosis present

## 2015-01-28 HISTORY — DX: Unspecified atrial fibrillation: I48.91

## 2015-01-28 LAB — BASIC METABOLIC PANEL
Anion gap: 10 (ref 5–15)
BUN: 23 mg/dL — ABNORMAL HIGH (ref 6–20)
CO2: 22 mmol/L (ref 22–32)
Calcium: 9 mg/dL (ref 8.9–10.3)
Chloride: 107 mmol/L (ref 101–111)
Creatinine, Ser: 1.56 mg/dL — ABNORMAL HIGH (ref 0.44–1.00)
GFR calc Af Amer: 38 mL/min — ABNORMAL LOW (ref >60)
GFR calc non Af Amer: 33 mL/min — ABNORMAL LOW (ref >60)
Glucose, Bld: 103 mg/dL — ABNORMAL HIGH (ref 65–99)
Potassium: 4.1 mmol/L (ref 3.5–5.1)
Sodium: 139 mmol/L (ref 135–145)

## 2015-01-28 LAB — CBC
HCT: 42.5 % (ref 36.0–46.0)
Hemoglobin: 13.9 g/dL (ref 12.0–15.0)
MCH: 30.7 pg (ref 26.0–34.0)
MCHC: 32.7 g/dL (ref 30.0–36.0)
MCV: 93.8 fL (ref 78.0–100.0)
Platelets: 214 10*3/uL (ref 150–400)
RBC: 4.53 MIL/uL (ref 3.87–5.11)
RDW: 17.1 % — ABNORMAL HIGH (ref 11.5–15.5)
WBC: 6.2 10*3/uL (ref 4.0–10.5)

## 2015-01-28 LAB — T4, FREE: Free T4: 1.25 ng/dL — ABNORMAL HIGH (ref 0.61–1.12)

## 2015-01-28 LAB — TSH: TSH: 1.657 u[IU]/mL (ref 0.350–4.500)

## 2015-01-28 LAB — I-STAT TROPONIN, ED: Troponin i, poc: 0.01 ng/mL (ref 0.00–0.08)

## 2015-01-28 LAB — MAGNESIUM: Magnesium: 2 mg/dL (ref 1.7–2.4)

## 2015-01-28 LAB — HEPARIN LEVEL (UNFRACTIONATED): Heparin Unfractionated: 0.71 IU/mL — ABNORMAL HIGH (ref 0.30–0.70)

## 2015-01-28 LAB — BRAIN NATRIURETIC PEPTIDE: B Natriuretic Peptide: 678.4 pg/mL — ABNORMAL HIGH (ref 0.0–100.0)

## 2015-01-28 MED ORDER — DIAZEPAM 5 MG PO TABS
5.0000 mg | ORAL_TABLET | Freq: Two times a day (BID) | ORAL | Status: DC | PRN
Start: 2015-01-28 — End: 2015-01-31

## 2015-01-28 MED ORDER — DILTIAZEM HCL 100 MG IV SOLR
10.0000 mg/h | INTRAVENOUS | Status: DC
  Administered 2015-01-28 (×2): 15 mg/h via INTRAVENOUS
  Administered 2015-01-28: 10 mg/h via INTRAVENOUS
  Administered 2015-01-29: 15 mg/h via INTRAVENOUS

## 2015-01-28 MED ORDER — ACETAMINOPHEN 325 MG PO TABS: 650.0000 mg | ORAL_TABLET | ORAL | Status: DC | PRN

## 2015-01-28 MED ORDER — HEPARIN (PORCINE) IN NACL 100-0.45 UNIT/ML-% IJ SOLN
950.0000 [IU]/h | INTRAMUSCULAR | Status: DC
  Administered 2015-01-28: 950 [IU]/h via INTRAVENOUS
  Filled 2015-01-28: qty 250

## 2015-01-28 MED ORDER — ONDANSETRON HCL 4 MG/2ML IJ SOLN: 4.0000 mg | Freq: Four times a day (QID) | INTRAMUSCULAR | Status: DC | PRN

## 2015-01-28 MED ORDER — HEPARIN BOLUS VIA INFUSION
3000.0000 [IU] | Freq: Once | INTRAVENOUS | Status: AC
  Administered 2015-01-28: 3000 [IU] via INTRAVENOUS
  Filled 2015-01-28: qty 3000

## 2015-01-28 MED ORDER — DILTIAZEM LOAD VIA INFUSION
10.0000 mg | Freq: Once | INTRAVENOUS | Status: AC
  Administered 2015-01-28: 10 mg via INTRAVENOUS
  Filled 2015-01-28: qty 10

## 2015-01-28 MED ORDER — FUROSEMIDE 40 MG PO TABS
40.0000 mg | ORAL_TABLET | Freq: Every day | ORAL | Status: DC
  Administered 2015-01-28 – 2015-01-30 (×3): 40 mg via ORAL
  Filled 2015-01-28 (×3): qty 1

## 2015-01-28 MED ORDER — FAMOTIDINE 20 MG PO TABS
10.0000 mg | ORAL_TABLET | Freq: Every day | ORAL | Status: DC
  Administered 2015-01-29 – 2015-01-31 (×3): 10 mg via ORAL
  Filled 2015-01-28 (×3): qty 1

## 2015-01-28 MED ORDER — APIXABAN 5 MG PO TABS
5.0000 mg | ORAL_TABLET | Freq: Two times a day (BID) | ORAL | Status: DC
  Administered 2015-01-28 – 2015-01-31 (×6): 5 mg via ORAL
  Filled 2015-01-28 (×6): qty 1

## 2015-01-28 NOTE — ED Notes (Signed)
Was taken off levothyroxine on 01/15/15 because of fatigue and other side effects.

## 2015-01-28 NOTE — Progress Notes (Signed)
[place] for heparin Indication: atrial fibrillation  Allergies  Allergen Reactions  . Levothyroxine     Exhaustion, shortness of breath  . Penicillins Other (See Comments)    dry mouth  . Statins     "Pain, weakness and kidney problems"    Patient Measurements: Height: 5' 0.05" (152.5 cm) Weight: 161 lb 4.8 oz (73.165 kg) IBW/kg (Calculated) : 45.62 Heparin Dosing Weight: 62.1kg  Vital Signs: Temp: 97.9 F (36.6 C) (06/27 2023) Temp Source: Oral (06/27 1221) BP: 121/67 mmHg (06/27 2023) Pulse Rate: 66 (06/27 2023)  Labs:  Recent Labs  01/28/15 1241 01/28/15 2222  HGB 13.9  --   HCT 42.5  --   PLT 214  --   HEPARINUNFRC  --  0.71*  CREATININE 1.56*  --     Estimated Creatinine Clearance: 30 mL/min (by C-G formula based on Cr of 1.56).  Assessment: 69 y.o. female with Afib for heparin  Goal of Therapy:  Heparin level 0.3-0.7 units/ml Monitor platelets by anticoagulation protocol: Yes   Plan:  Continue Heparin at current rate for now Follow-up am labs.  Phillis Knack, PharmD, BCPS   01/28/2015 10:58 PM

## 2015-01-28 NOTE — ED Notes (Signed)
Per EMS: was set to have cataract surgery today and was found to be in afib RVR, rate 140s-160s, unknown onset, pt was asymptomatic during this event, has had hot flash spells and palpitations occuring over past 3 years, so not sure when exact onset is.... Not cardioverted by EMS for this reason, VSS, denies cp.  Hypertensive.

## 2015-01-28 NOTE — H&P (Signed)
Patient ID: Tiffany [name] MRN: [id number], DOB/AGE: Mar 13, 1946   Admit date: 01/28/2015   Primary Physician: Gwendolyn Grant, MD Primary Cardiologist: Dr. Percival Spanish (2013)  Pt. Profile:  69 y/o female with h/o HTN, DLD and hypothyroidism presenting with new onset atrial fibrillation w/ RVR.  Problem List  Past Medical History  Diagnosis Date  . Obesity, unspecified   . URINARY INCONTINENCE 04-20-12    occ. with nighttime sleep pattern  . SYNCOPE, HX OF   . BENIGN POSITIONAL VERTIGO   . GERD   . DYSLIPIDEMIA   . Raynaud disease   . Complication of anesthesia 04-20-12    some issues with prolonged sedation after anesthesia  . Arthritis 04-20-12    Osteoarthritis-right hip  . HYPERTENSION     severe, pt intol of med tx, Pt. has severe "whitecoat" syndrome  . Osteopenia   . Cataract   . Esophageal stricture   . Diverticulosis   . Hypothyroidism 09/29/2014    New dx 09/2014    Past Surgical History  Procedure Laterality Date  . Nasal fracture surgery  2006  . Tubal ligation  1980  . Cholecystectomy      '90-"sludge"  . Total hip arthroplasty  04/26/2012    Procedure: TOTAL HIP ARTHROPLASTY ANTERIOR APPROACH;  Surgeon: Mauri Pole, MD;  Location: [facility]S;  Service: Orthopedics;  Laterality: Right;  . Breast ultrasound Right 09/13/13    There is no sonographic evidence of malignancy. the [id number] complicated cyst in (R) breast is consistent with a benign finding. repeat in 1 year     Allergies  Allergies  Allergen Reactions  . Levothyroxine     Exhaustion, shortness of breath  . Penicillins Other (See Comments)    dry mouth  . Statins     "Pain, weakness and kidney problems"    HPI  The patient is a 69 y/o female with a PMH significant for HTN (white coat syndrome), HLD Reynaud Syndrome and hypothyroidism. Per records, her hypothyroidism was diagnosed in February and she was taken off of levothyroxine on 01/15/15 due to fatigue and other side effects. She has been  seen by Dr. Percival Spanish in the past. In 2013, she wore a heart monitor for symptoms of presyncope. She was found to have occasional PACs but no other arrhthymias. She denies a history of diabetes, sleep apnea, peptic ulcer disease, GI bleed, stroke/TIA or tobacco use. She was adopted and does not know any family history. She admits to generalized anxiety and often takes Valium as needed.   She was scheduled to have cataract surgery today and was found to be in atrial fibrillation w/ RVR with rates in the 140s-160s. She was asymptomatic at the doctor's office but she reports a 3 year history of intermittent symptoms of palpitations, lightheadedness, shortness of breath and fatigue. She also notes recent h/o exertional dyspnea and occasional chest pressure. She was sent from the doctor's office to the ED for further evaluation.  In the ED, labs are notable for elevated SCr at 1.56. CBC is normal. K is normal at 4.1 and magnesium is normal at 2.0. Initial troponin is negative. She had a BNP 3 days ago that was also elevated at 940.0. She reports that her PCP ordered a BMP due to complaints of increased lower extremity edema. She was started on by mouth Lasix and reports improvement in symptoms. CXR today shows enlarged cardiac silhouette but no acute abnormalities.  She is currently on IV Cardizem and IV heparin. Heart  rate is improved but still elevated in the 100s to 120s. Blood is pressure stable. She denies any recent excessive caffeine or alcohol use. She denies any recent fever, chills, nausea, vomiting, diarrhea, melanoma or dysuria.      Home Medications  Prior to Admission medications   Medication Sig Start Date End Date Taking? Authorizing Provider  calcium carbonate (OS-CAL) 600 MG TABS Take 600 mg by mouth daily.     Yes Historical Provider, MD  clindamycin (CLEOCIN) 150 MG capsule Use for dental procedures only 03/19/14  Yes Historical Provider, MD  diazepam (VALIUM) 5 MG tablet Take 1 tablet (5  mg total) by mouth every 12 (twelve) hours as needed for anxiety. for anxiety 11/22/14  Yes Rowe Clack, MD  estradiol (ESTRACE VAGINAL) 0.1 MG/GM vaginal cream Place 1 Applicatorful vaginally once a week. Patient taking differently: Place vaginally once a week. 1 gram once weekly 09/26/14  Yes Rowe Clack, MD  famotidine (PEPCID AC) 10 MG tablet Take 1 tablet (10 mg total) by mouth 2 (two) times daily as needed for heartburn or indigestion ((or OTC Zantac 75mg )). Patient taking differently: Take 10 mg by mouth daily.  09/26/14  Yes Rowe Clack, MD  fluticasone (FLONASE) 50 MCG/ACT nasal spray Place 1 spray into both nostrils daily. Patient taking differently: Place 1 spray into both nostrils daily as needed for allergies or rhinitis.  09/27/13  Yes Rowe Clack, MD  furosemide (LASIX) 40 MG tablet Take 1 tablet (40 mg total) by mouth daily. 01/25/15  Yes Olga Millers, MD  miconazole (MICOTIN) 2 % powder Apply topically as needed for itching. 09/26/14  Yes Rowe Clack, MD  Multiple Vitamins-Minerals (CENTRUM SILVER) tablet Take 1 tablet by mouth daily.    Yes Historical Provider, MD  Omega-3 Fatty Acids (FISH OIL) 1200 MG CAPS Take 2 each by mouth daily.    Yes Historical Provider, MD    Family History  Family History  Problem Relation Age of Onset  . Adopted: Yes  . Family history unknown: Yes    Social History  History   Social History  . Marital Status: Married    Spouse Name: N/A  . Number of Children: 2  . Years of Education: N/A   Occupational History  . Not on file.   Social History Main Topics  . Smoking status: Never Smoker   . Smokeless tobacco: Never Used  . Alcohol Use: 1.2 oz/week    2 Glasses of wine per week     Comment: several glasses wine weekly  . Drug Use: No  . Sexual Activity: Yes   Other Topics Concern  . Not on file   Social History Narrative   She enjoys birding.  Lives at home with husband.   Married.    retired Quarry manager, Tax inspector     Review of Systems General:  No chills, fever, night sweats or weight changes.  Cardiovascular:  No chest pain, dyspnea on exertion, edema, orthopnea, palpitations, paroxysmal nocturnal dyspnea. Dermatological: No rash, lesions/masses Respiratory: No cough, dyspnea Urologic: No hematuria, dysuria Abdominal:   No nausea, vomiting, diarrhea, bright red blood per rectum, melena, or hematemesis Neurologic:  No visual changes, wkns, changes in mental status. All other systems reviewed and are otherwise negative except as noted above.  Physical Exam  Blood pressure 137/98, pulse 113, temperature 97.6 F (36.4 C), temperature source Oral, resp. rate 18, height 5' 0.5" (1.537 m), weight 156 lb (70.761 kg),  SpO2 95 %.  General: Pleasant, NAD Psych: Normal affect. Neuro: Alert and oriented X 3. Moves all extremities spontaneously. HEENT: Normal  Neck: Supple without bruits or JVD. Lungs:  Resp regular and unlabored, CTA. Heart: irregularly irregular, no s3, s4, or murmurs. Abdomen: Soft, non-tender, non-distended, BS + x 4.  Extremities: No clubbing, cyanosis or edema. DP/PT/Radials 2+ and equal bilaterally.  Labs  Troponin [facility] Test)  Recent Labs  01/28/15 1251  TROPIPOC 0.01   No results for input(s): CKTOTAL, CKMB, TROPONINI in the last 72 hours. Lab Results  Component Value Date   WBC 6.2 01/28/2015   HGB 13.9 01/28/2015   HCT 42.5 01/28/2015   MCV 93.8 01/28/2015   PLT 214 01/28/2015     Recent Labs Lab 01/25/15 0831 01/28/15 1241  NA 142 139  K 4.0 4.1  CL 110 107  CO2 23 22  BUN 23 23*  CREATININE 1.57* 1.56*  CALCIUM 9.0 9.0  PROT 6.5  --   BILITOT 0.8  --   ALKPHOS 93  --   ALT 58*  --   AST 47*  --   GLUCOSE 130* 103*   Lab Results  Component Value Date   CHOL 237* 09/28/2014   HDL 59.00 09/28/2014   LDLCALC 148* 09/28/2014   TRIG 150.0* 09/28/2014   No results found for:  DDIMER   Radiology/Studies  Dg Chest Port 1 View  01/28/2015   CLINICAL DATA:  Atrial fibrillation, palpitations, irregular heart rate detected before cataract surgery, history pneumonia, hypertension  EXAM: PORTABLE CHEST - 1 VIEW  COMPARISON:  Portable exam 1257 hours compared to 10/01/2011  FINDINGS: Enlargement of cardiac silhouette.  Mediastinal contours and pulmonary vascularity normal.  Lungs clear.  No pleural effusion or pneumothorax.  Minimal atherosclerotic calcification aorta.  Bones appear demineralized.  IMPRESSION: Enlargement of cardiac silhouette.  No acute abnormalities.   Electronically Signed   By: Lavonia Dana M.D.   On: 01/28/2015 13:29    ECG  Afib w/ RVR 150 bpm     ASSESSMENT AND PLAN  1. Atrial fibrillation with RVR: New onset. Blood pressure stable in the [id number] systolic. Heart rate remains elevated in the 100s to 120s. Patient is asymptomatic. Will continue workup to assess for possible etiologies. Electrolytes including magnesium and potassium are within normal limits. Will check TSH. We'll also order a 2-D echo. For now, continue IV diltiazem for rate control. Her CHA2DS2 VASc score is at least 2 given her age of 32 and female sex. We will transition to PO Eliquis, 5 mg BID. Will admit to tele and observe overnight. If rate control and or NSR is achieved, will convert to PO Cardizem in the am.     Signed, SIMMONS, BRITTAINY, PA-C 01/28/2015, 3:25 PM  Personally seen and examined. Agree with above.  69 year old with newly discovered atrial fibrillation.  AFIB  - anticoagulate, Eliquis now  - TSH, Free T4  - Rate control with diltiazem. Now 57's  - Consolidate in AM.   - Potential DC tomorrow if stable.   Raynaud's   - noted on exam  - warming packs  - daughter has as well (50)  Discussed stroke risk, risk of bleeding. Husband present. She is allergic to several meds. Worried.   Candee Furbish, MD

## 2015-01-28 NOTE — ED Provider Notes (Signed)
CSN: [id number]     Arrival date & time 01/28/15  1217 History   First MD Initiated Contact with Patient 01/28/15 1226     Chief Complaint  Patient presents with  . Atrial Fibrillation     (Consider location/radiation/quality/duration/timing/severity/associated sxs/prior Treatment) HPI Comments: Patient presents to the emergency department for evaluation of atrial fibrillation. Patient presented for cataract surgery this morning and was found to be in atrial fibrillation. Patient reportedly arrived with a heart rate of 70 which was regular, but during the preparatory phases, was suddenly noted to go into atrial fibrillation with a rapid ventricular response. Patient denies chest pain, shortness of breath. She is essentially asymptomatic.  Patient does, however, report that she has been experiencing intermittent episodes of palpitations over the last several years. She has seen Dr. Percival Spanish in the past, has not seen him in approximately 3 years. She has never had the palpitations captured on EKG or monitor.  Patient is a 69 y.o. female presenting with atrial fibrillation.  Atrial Fibrillation    Past Medical History  Diagnosis Date  . Obesity, unspecified   . URINARY INCONTINENCE 04-20-12    occ. with nighttime sleep pattern  . SYNCOPE, HX OF   . BENIGN POSITIONAL VERTIGO   . GERD   . DYSLIPIDEMIA   . Raynaud disease   . Complication of anesthesia 04-20-12    some issues with prolonged sedation after anesthesia  . Arthritis 04-20-12    Osteoarthritis-right hip  . HYPERTENSION     severe, pt intol of med tx, Pt. has severe "whitecoat" syndrome  . Osteopenia   . Cataract   . Esophageal stricture   . Diverticulosis   . Hypothyroidism 09/29/2014    New dx 09/2014   Past Surgical History  Procedure Laterality Date  . Nasal fracture surgery  2006  . Tubal ligation  1980  . Cholecystectomy      '90-"sludge"  . Total hip arthroplasty  04/26/2012    Procedure: TOTAL HIP ARTHROPLASTY  ANTERIOR APPROACH;  Surgeon: Mauri Pole, MD;  Location: [facility];  Service: Orthopedics;  Laterality: Right;  . Breast ultrasound Right 09/13/13    There is no sonographic evidence of malignancy. the [id number] complicated cyst in (R) breast is consistent with a benign finding. repeat in 1 year   Family History  Problem Relation Age of Onset  . Adopted: Yes   History  Substance Use Topics  . Smoking status: Never Smoker   . Smokeless tobacco: Never Used  . Alcohol Use: 1.2 oz/week    2 Glasses of wine per week     Comment: several glasses wine weekly   OB History    No data available     Review of Systems  Cardiovascular: Positive for palpitations.  All other systems reviewed and are negative.     Allergies  Penicillins and Statins  Home Medications   Prior to Admission medications   Medication Sig Start Date End Date Taking? Authorizing Provider  calcium carbonate (OS-CAL) 600 MG TABS Take 600 mg by mouth daily.      Historical Provider, MD  clindamycin (CLEOCIN) 150 MG capsule Use for dental procedures only 03/19/14   Historical Provider, MD  diazepam (VALIUM) 5 MG tablet Take 1 tablet (5 mg total) by mouth every 12 (twelve) hours as needed for anxiety. for anxiety 11/22/14   Rowe Clack, MD  estradiol (ESTRACE VAGINAL) 0.1 MG/GM vaginal cream Place 1 Applicatorful vaginally once a week. Patient taking differently: Place vaginally  once a week. 1 gram once weekly 09/26/14   Rowe Clack, MD  famotidine (PEPCID AC) 10 MG tablet Take 1 tablet (10 mg total) by mouth 2 (two) times daily as needed for heartburn or indigestion ((or OTC Zantac 75mg )). 09/26/14   Rowe Clack, MD  fluticasone (FLONASE) 50 MCG/ACT nasal spray Place 1 spray into both nostrils daily. 09/27/13   Rowe Clack, MD  furosemide (LASIX) 40 MG tablet Take 1 tablet (40 mg total) by mouth daily. 01/25/15   Olga Millers, MD  miconazole (MICOTIN) 2 % powder Apply topically as needed for  itching. 09/26/14   Rowe Clack, MD  Multiple Vitamins-Minerals (CENTRUM SILVER) tablet Take 1 tablet by mouth daily.     Historical Provider, MD  Omega-3 Fatty Acids (FISH OIL) 1200 MG CAPS Take 2 each by mouth daily.     Historical Provider, MD   BP 142/102 mmHg  Pulse 104  Temp(Src) 97.6 F (36.4 C) (Oral)  Resp 18  Ht 5' 0.5" (1.537 m)  Wt 156 lb (70.761 kg)  BMI 29.95 kg/m2  SpO2 100% Physical Exam  Constitutional: She is oriented to person, place, and time. She appears well-developed and well-nourished. No distress.  HENT:  Head: Normocephalic and atraumatic.  Right Ear: Hearing normal.  Left Ear: Hearing normal.  Nose: Nose normal.  Mouth/Throat: Oropharynx is clear and moist and mucous membranes are normal.  Eyes: Conjunctivae and EOM are normal. Pupils are equal, round, and reactive to light.  Neck: Normal range of motion. Neck supple.  Cardiovascular: S1 normal and S2 normal.  An irregularly irregular rhythm present. Tachycardia present.  Exam reveals no gallop and no friction rub.   No murmur heard. Pulmonary/Chest: Effort normal and breath sounds normal. No respiratory distress. She exhibits no tenderness.  Abdominal: Soft. Normal appearance and bowel sounds are normal. There is no hepatosplenomegaly. There is no tenderness. There is no rebound, no guarding, no tenderness at McBurney's point and negative Murphy's sign. No hernia.  Musculoskeletal: Normal range of motion.  Neurological: She is alert and oriented to person, place, and time. She has normal strength. No cranial nerve deficit or sensory deficit. Coordination normal. GCS eye subscore is 4. GCS verbal subscore is 5. GCS motor subscore is 6.  Skin: Skin is warm, dry and intact. No rash noted. No cyanosis.  Psychiatric: She has a normal mood and affect. Her speech is normal and behavior is normal. Thought content normal.  Nursing note and vitals reviewed.   ED Course  Procedures (including critical care  time) Labs Review Labs Reviewed  CBC - Abnormal; Notable for the following:    RDW 17.1 (*)    All other components within normal limits  BASIC METABOLIC PANEL  MAGNESIUM  BRAIN NATRIURETIC PEPTIDE  I-STAT TROPOININ, ED    Imaging Review No results found.   EKG Interpretation   Date/Time:  Monday January 28 2015 12:18:25 EDT Ventricular Rate:  150 PR Interval:    QRS Duration: 68 QT Interval:  303 QTC Calculation: 479 R Axis:   11 Text Interpretation:  Atrial fibrillation with rapid V-rate Anterior  infarct, old Nonspecific T abnormalities, lateral leads Confirmed by  Reign Dziuba  MD, Suri Tafolla [phone]) on 01/28/2015 12:53:48 PM      MDM   Final diagnoses:  None  atrial fibrillation with rapid ventricular response  Patient presents to the emergency department for evaluation of atrial fibrillation. Patient presented earlier today for cataract surgery. Sounds as though she was in  sinus rhythm at initial presentation and then suddenly became tachycardic and was found to be in atrial fibrillation. She was referred to the ER. Patient does have a long history of heart palpitations, but has not had documented arrhythmia or atrial fibrillation in the past. Patient is essentially asymptomatic at rest here in the ER. Workup has been unremarkable. She was started on Cardizem bolus and drip with some improvement. Cardiology to evaluate.    Orpah Greek, MD 01/28/15 [phone]

## 2015-01-28 NOTE — ED Notes (Signed)
Portable xray at bedside.

## 2015-01-28 NOTE — Progress Notes (Signed)
ANTICOAGULATION CONSULT NOTE - Initial Consult  Pharmacy Consult for heparin Indication: atrial fibrillation  Allergies  Allergen Reactions  . Levothyroxine     Exhaustion, shortness of breath  . Penicillins Other (See Comments)    dry mouth  . Statins     "Pain, weakness and kidney problems"    Patient Measurements: Height: 5' 0.5" (153.7 cm) Weight: 156 lb (70.761 kg) IBW/kg (Calculated) : 46.65 Heparin Dosing Weight: 62.1kg  Vital Signs: Temp: 97.6 F (36.4 C) (06/27 1221) Temp Source: Oral (06/27 1221) BP: 131/101 mmHg (06/27 1400) Pulse Rate: 75 (06/27 1400)  Labs:  Recent Labs  01/28/15 1241  HGB 13.9  HCT 42.5  PLT 214  CREATININE 1.56*    Estimated Creatinine Clearance: 29.8 mL/min (by C-G formula based on Cr of 1.56).   Medical History: Past Medical History  Diagnosis Date  . Obesity, unspecified   . URINARY INCONTINENCE 04-20-12    occ. with nighttime sleep pattern  . SYNCOPE, HX OF   . BENIGN POSITIONAL VERTIGO   . GERD   . DYSLIPIDEMIA   . Raynaud disease   . Complication of anesthesia 04-20-12    some issues with prolonged sedation after anesthesia  . Arthritis 04-20-12    Osteoarthritis-right hip  . HYPERTENSION     severe, pt intol of med tx, Pt. has severe "whitecoat" syndrome  . Osteopenia   . Cataract   . Esophageal stricture   . Diverticulosis   . Hypothyroidism 09/29/2014    New dx 09/2014    Assessment: 87 yof to ED with afib with RVR, unknown onset. Asymptomatic. Found when went for cataract surgery today. Pharmacy consulted to dose IV heparin for afib (no anticoagulation pta). CBC wnl. No bleed documented.  Goal of Therapy:  Heparin level 0.3-0.7 units/ml Monitor platelets by anticoagulation protocol: Yes   Plan:  Heparin bolus 3000 units Heparin IV @ 950 units/h 8h HL Daily HL/CBC Mon s/sx bleed  Elicia Lamp, PharmD Clinical Pharmacist - Resident Pager [phone] 01/28/2015 2:29 PM

## 2015-01-28 NOTE — ED Notes (Addendum)
Pt has poor distal cap refill (hx of raynold's), spo2 intermittently has good signal and shows 96% and above with a good waveform on plethysmography.

## 2015-01-29 ENCOUNTER — Observation Stay (HOSPITAL_COMMUNITY): Payer: Medicare Other

## 2015-01-29 DIAGNOSIS — N184 Chronic kidney disease, stage 4 (severe): Secondary | ICD-10-CM | POA: Diagnosis present

## 2015-01-29 DIAGNOSIS — M341 CR(E)ST syndrome: Secondary | ICD-10-CM | POA: Diagnosis present

## 2015-01-29 DIAGNOSIS — I4891 Unspecified atrial fibrillation: Secondary | ICD-10-CM

## 2015-01-29 LAB — BASIC METABOLIC PANEL
Anion gap: 7 (ref 5–15)
BUN: 19 mg/dL (ref 6–20)
CO2: 22 mmol/L (ref 22–32)
Calcium: 8.4 mg/dL — ABNORMAL LOW (ref 8.9–10.3)
Chloride: 110 mmol/L (ref 101–111)
Creatinine, Ser: 1.36 mg/dL — ABNORMAL HIGH (ref 0.44–1.00)
GFR calc Af Amer: 45 mL/min — ABNORMAL LOW (ref >60)
GFR calc non Af Amer: 38 mL/min — ABNORMAL LOW (ref >60)
Glucose, Bld: 105 mg/dL — ABNORMAL HIGH (ref 65–99)
Potassium: 3.4 mmol/L — ABNORMAL LOW (ref 3.5–5.1)
Sodium: 139 mmol/L (ref 135–145)

## 2015-01-29 LAB — CBC
HCT: 40.7 % (ref 36.0–46.0)
Hemoglobin: 13.4 g/dL (ref 12.0–15.0)
MCH: 30.7 pg (ref 26.0–34.0)
MCHC: 32.9 g/dL (ref 30.0–36.0)
MCV: 93.3 fL (ref 78.0–100.0)
Platelets: 222 10*3/uL (ref 150–400)
RBC: 4.36 MIL/uL (ref 3.87–5.11)
RDW: 17.4 % — ABNORMAL HIGH (ref 11.5–15.5)
WBC: 5.7 10*3/uL (ref 4.0–10.5)

## 2015-01-29 LAB — HEPARIN LEVEL (UNFRACTIONATED): Heparin Unfractionated: >2.2 IU/mL — ABNORMAL HIGH (ref 0.30–0.70)

## 2015-01-29 MED ORDER — METOPROLOL TARTRATE 50 MG PO TABS
50.0000 mg | ORAL_TABLET | Freq: Two times a day (BID) | ORAL | Status: DC
  Administered 2015-01-29: 50 mg via ORAL
  Filled 2015-01-29: qty 1

## 2015-01-29 MED ORDER — POTASSIUM CHLORIDE CRYS ER 20 MEQ PO TBCR
20.0000 meq | EXTENDED_RELEASE_TABLET | Freq: Every day | ORAL | Status: DC
  Administered 2015-01-30 – 2015-01-31 (×2): 20 meq via ORAL
  Filled 2015-01-29 (×2): qty 1

## 2015-01-29 MED ORDER — METOPROLOL TARTRATE 50 MG PO TABS: 50.0000 mg | ORAL_TABLET | Freq: Two times a day (BID) | ORAL | Status: DC

## 2015-01-29 MED ORDER — FUROSEMIDE 40 MG PO TABS: 40.0000 mg | ORAL_TABLET | Freq: Every day | ORAL | Status: DC

## 2015-01-29 MED ORDER — POTASSIUM CHLORIDE CRYS ER 20 MEQ PO TBCR: 20.0000 meq | EXTENDED_RELEASE_TABLET | Freq: Every day | ORAL | Status: DC

## 2015-01-29 MED ORDER — FAMOTIDINE 10 MG PO TABS: 10.0000 mg | ORAL_TABLET | Freq: Every day | ORAL | Status: DC

## 2015-01-29 MED ORDER — ESTRADIOL 0.1 MG/GM VA CREA: 1.0000 | TOPICAL_CREAM | VAGINAL | Status: DC

## 2015-01-29 MED ORDER — ACETAMINOPHEN 325 MG PO TABS: 650.0000 mg | ORAL_TABLET | ORAL | Status: AC | PRN

## 2015-01-29 MED ORDER — FLUTICASONE PROPIONATE 50 MCG/ACT NA SUSP: 1.0000 | Freq: Every day | NASAL | Status: DC | PRN

## 2015-01-29 MED ORDER — APIXABAN 5 MG PO TABS: 5.0000 mg | ORAL_TABLET | Freq: Two times a day (BID) | ORAL | Status: DC

## 2015-01-29 MED ORDER — POTASSIUM CHLORIDE CRYS ER 20 MEQ PO TBCR
40.0000 meq | EXTENDED_RELEASE_TABLET | Freq: Once | ORAL | Status: AC
  Administered 2015-01-29: 40 meq via ORAL
  Filled 2015-01-29: qty 2

## 2015-01-29 MED ORDER — OFF THE BEAT BOOK
Freq: Once | Status: AC
  Administered 2015-01-29: 1
  Filled 2015-01-29: qty 1

## 2015-01-29 MED ORDER — METOPROLOL TARTRATE 25 MG PO TABS
25.0000 mg | ORAL_TABLET | Freq: Two times a day (BID) | ORAL | Status: DC
  Administered 2015-01-29 – 2015-01-31 (×4): 25 mg via ORAL
  Filled 2015-01-29: qty 2
  Filled 2015-01-29: qty 1
  Filled 2015-01-29: qty 2
  Filled 2015-01-29 (×2): qty 1

## 2015-01-29 NOTE — Progress Notes (Signed)
  Echocardiogram 2D Echocardiogram has been performed.  Tiffany [name] 01/29/2015, 11:19 AM

## 2015-01-29 NOTE — Care Management Note (Addendum)
Case Management Note  Patient Details  Name: Tiffany [name] MRN: [id number] Date of Birth: 17-Nov-1945  Subjective/Objective:  Pt admitted for Afib RVR.                  Action/Plan: Benefits check in process for Eliquis. CM will make pt aware once completed. No further needs from CM at this time.    Expected Discharge Date:                  Expected Discharge Plan:  Home/Self Care  In-House Referral:     Discharge planning Services  CM Consult, Medication Assistance  Post Acute Care Choice:    Choice offered to:     DME Arranged:    DME Agency:     HH Arranged:    HH Agency:     Status of Service:  In process, will continue to follow  Medicare Important Message Given:    Date Medicare IM Given:    Medicare IM give by:    Date Additional Medicare IM Given:    Additional Medicare Important Message give by:     If discussed at [place] of Stay Meetings, dates discussed:    Additional Comments: 1222 01-29-15 Jacqlyn Krauss, RN,BSN [phone] CM did receive benefits check for Eliquis. Co pay will be 40.00. CM will make pt aware. CM did call [facility] on [address] and medication is available. No further needs from CM at this time.   Bethena Roys, RN 01/29/2015, 10:31 AM

## 2015-01-29 NOTE — Progress Notes (Signed)
Subjective:  She feels better, less tachycardic  Objective:  Vital Signs in the last 24 hours: Temp:  [97.5 F (36.4 C)-97.9 F (36.6 C)] 97.5 F (36.4 C) (06/28 0739) Pulse Rate:  [36-152] 112 (06/28 0441) Resp:  [11-33] 22 (06/28 0441) BP: (108-167)/(67-151) 112/87 mmHg (06/28 0441) SpO2:  [90 %-100 %] 98 % (06/28 0441) Weight:  [156 lb (70.761 kg)-278 lb 6.4 oz (126.281 kg)] 278 lb 6.4 oz (126.281 kg) (06/28 0441)  Intake/Output from previous day:  Intake/Output Summary (Last 24 hours) at 01/29/15 0848 Last data filed at 01/29/15 0400  Gross per 24 hour  Intake 500.06 ml  Output    300 ml  Net 200.06 ml    Physical Exam: General appearance: alert, cooperative, no distress and OS pupil dilated Neck: no JVD Lungs: clear to auscultation bilaterally Heart: irregularly irregular rhythm Abdomen: soft, non-tender; bowel sounds normal; no masses,  no organomegaly Extremities: trace edema   Rate: 90-115  Rhythm: atrial fibrillation  Lab Results:  Recent Labs  01/28/15 1241 01/29/15 0449  WBC 6.2 5.7  HGB 13.9 13.4  PLT 214 222    Recent Labs  01/28/15 1241 01/29/15 0449  NA 139 139  K 4.1 3.4*  CL 107 110  CO2 22 22  GLUCOSE 103* 105*  BUN 23* 19  CREATININE 1.56* 1.36*   No results for input(s): TROPONINI in the last 72 hours.  Invalid input(s): CK, MB No results for input(s): INR in the last 72 hours.  Scheduled Meds: . apixaban  5 mg Oral BID  . famotidine  10 mg Oral Daily  . furosemide  40 mg Oral Daily  . metoprolol tartrate  50 mg Oral BID  . [START ON 01/30/2015] potassium chloride  20 mEq Oral Daily   Continuous Infusions: . diltiazem (CARDIZEM) infusion 15 mg/hr (01/29/15 0645)   PRN Meds:.acetaminophen, diazepam, ondansetron (ZOFRAN) IV   Imaging: Imaging results have been reviewed  Cardiac Studies:  Assessment/Plan:  69 y/o female with h/o HTN, HLD and hypothyroidism presented 01/28/15 with new onset atrial fibrillation w/  RVR. She was at her eye doctor having cataract removal when it was noted her HR was fast and irregular.   Principal Problem:   Atrial fibrillation with RVR- new onset, CHADs VASc =3 for age, HTN, sex  Active Problems:   Hypothyroidism   Hyperlipidemia   Obesity-BMI 19   White coat hypertension   GERD   Anxiety   Raynaud's disease   PLAN: Change IV Diltiazem to po Lopressor (less chance of exacerbating her LE edema). Eliquis added. Echo ordered. Consider OP DCCV in 4 weeks. Possibly home later today. Replace K+.   (She has an echo scheduled for tomorrow in the office but would prefer to have done while she is here).  I will contact Dr Tommy Rainwater office about persistent OS pupil dilatation.   Kerin Ransom PA-C 01/29/2015, 8:48 AM [phone]  History and all data above reviewed.  Patient examined.  I agree with the findings as above.  No chest pain.  Feels better with rate control.   The patient exam reveals [id number]  ,  Lungs: clear  ,  Abd: Positive bowel sounds, no rebound no guarding, Ext No edema  .  All available labs, radiology testing, previous records reviewed. Agree with documented assessment and plan. Atrial fib:  Transition to PO Cardizem and if rate OK discharge.  Discussed risks as benefits of anticoagulation.    Jeneen Rinks [facility]  9:28 AM  01/29/2015

## 2015-01-29 NOTE — Progress Notes (Signed)
Despite good HR control pt still symptomatic when ambulating- SOB, weak. Discussed with Dr Percival Spanish, plan TEE CV tomorrow. She had an esophageal dilatation in the fall of 2015 (Dr Henrene Pastor), but has no problems swallowing at this time. I did cut her Lopressor back to 25 mg BID.   The risks and benefits of transesophageal echocardiogram have been explained to the patient and her husband including risks of esophageal damage, perforation (1:10,000 risk), bleeding, pharyngeal hematoma as well as other potential complications associated with conscious sedation including aspiration, arrhythmia, respiratory failure and death. Alternatives to treatment were discussed, questions were answered. Patient is willing to proceed.   Erlene Quan, PA 01/29/2015 1:48 PM

## 2015-01-29 NOTE — Progress Notes (Signed)
Ambulated pt around the unit.  While ambulating pt complaint of dizziness, lightheadedness, SHOB, and increased weakness.  Pt brought back to room and BP 97/72, HR in the 90's.  Kerin Ransom, [place] notified and no new orders received.  Will continue to closely monitor.

## 2015-01-29 NOTE — Discharge Instructions (Addendum)
Apixaban oral tablets What is this medicine? APIXABAN (a PIX a ban) is an anticoagulant (blood thinner). It is used to lower the chance of stroke in people with a medical condition called atrial fibrillation. It is also used to treat or prevent blood clots in the lungs or in the veins. This medicine may be used for other purposes; ask your health care provider or pharmacist if you have questions. COMMON BRAND NAME(S): Eliquis What should I tell my health care provider before I take this medicine? They need to know if you have any of these conditions: -bleeding disorders -bleeding in the brain -blood in your stools (black or tarry stools) or if you have blood in your vomit -history of stomach bleeding -kidney disease -liver disease -mechanical heart valve -an unusual or allergic reaction to apixaban, other medicines, foods, dyes, or preservatives -pregnant or trying to get pregnant -breast-feeding How should I use this medicine? Take this medicine by mouth with a glass of water. Follow the directions on the prescription label. You can take it with or without food. If it upsets your stomach, take it with food. Take your medicine at regular intervals. Do not take it more often than directed. Do not stop taking except on your doctor's advice. Stopping this medicine may increase your risk of a blot clot. Be sure to refill your prescription before you run out of medicine. Talk to your pediatrician regarding the use of this medicine in children. Special care may be needed. Overdosage: If you think you have taken too much of this medicine contact a poison control center or emergency room at once. NOTE: This medicine is only for you. Do not share this medicine with others. What if I miss a dose? If you miss a dose, take it as soon as you can. If it is almost time for your next dose, take only that dose. Do not take double or extra doses. What may interact with this medicine? This medicine may  interact with the following: -aspirin and aspirin-like medicines -certain medicines for fungal infections like ketoconazole and itraconazole -certain medicines for seizures like carbamazepine and phenytoin -certain medicines that treat or prevent blood clots like warfarin, enoxaparin, and dalteparin -clarithromycin -NSAIDs, medicines for pain and inflammation, like ibuprofen or naproxen -rifampin -ritonavir -St. John's wort This list may not describe all possible interactions. Give your health care provider a list of all the medicines, herbs, non-prescription drugs, or dietary supplements you use. Also tell them if you smoke, drink alcohol, or use illegal drugs. Some items may interact with your medicine. What should I watch for while using this medicine? Notify your doctor or health care professional and seek emergency treatment if you develop breathing problems; changes in vision; chest pain; severe, sudden headache; pain, swelling, warmth in the leg; trouble speaking; sudden numbness or weakness of the face, arm, or leg. These can be signs that your condition has gotten worse. If you are going to have surgery, tell your doctor or health care professional that you are taking this medicine. Tell your health care professional that you use this medicine before you have a spinal or epidural procedure. Sometimes people who take this medicine have bleeding problems around the spine when they have a spinal or epidural procedure. This bleeding is very rare. If you have a spinal or epidural procedure while on this medicine, call your health care professional immediately if you have back pain, numbness or tingling (especially in your legs and feet), muscle weakness, paralysis, or loss  of bladder or bowel control. Avoid sports and activities that might cause injury while you are using this medicine. Severe falls or injuries can cause unseen bleeding. Be careful when using sharp tools or knives. Consider using  an Copy. Take special care brushing or flossing your teeth. Report any injuries, bruising, or red spots on the skin to your doctor or health care professional. What side effects may I notice from receiving this medicine? Side effects that you should report to your doctor or health care professional as soon as possible: -allergic reactions like skin rash, itching or hives, swelling of the face, lips, or tongue -signs and symptoms of bleeding such as bloody or black, tarry stools; red or dark-brown urine; spitting up blood or brown material that looks like coffee grounds; red spots on the skin; unusual bruising or bleeding from the eye, gums, or nose This list may not describe all possible side effects. Call your doctor for medical advice about side effects. You may report side effects to FDA at 1-800-FDA-1088. Where should I keep my medicine? Keep out of the reach of children. Store at room temperature between 20 and 25 degrees C (68 and 77 degrees F). Throw away any unused medicine after the expiration date. NOTE: This sheet is a summary. It may not cover all possible information. If you have questions about this medicine, talk to your doctor, pharmacist, or health care provider.  2015, Elsevier/Gold Standard. (2013-03-24 11:59:24) Atrial Fibrillation Atrial fibrillation is a type of irregular heart rhythm (arrhythmia). During atrial fibrillation, the upper chambers of the heart (atria) quiver continuously in a chaotic pattern. This causes an irregular and often rapid heart rate.  Atrial fibrillation is the result of the heart becoming overloaded with disorganized signals that tell it to beat. These signals are normally released one at a time by a part of the right atrium called the sinoatrial node. They then travel from the atria to the lower chambers of the heart (ventricles), causing the atria and ventricles to contract and pump blood as they pass. In atrial fibrillation, parts of the  atria outside of the sinoatrial node also release these signals. This results in two problems. First, the atria receive so many signals that they do not have time to fully contract. Second, the ventricles, which can only receive one signal at a time, beat irregularly and out of rhythm with the atria.  There are three types of atrial fibrillation:   Paroxysmal. Paroxysmal atrial fibrillation starts suddenly and stops on its own within a week.  Persistent. Persistent atrial fibrillation lasts for more than a week. It may stop on its own or with treatment.  Permanent. Permanent atrial fibrillation does not go away. Episodes of atrial fibrillation may lead to permanent atrial fibrillation. Atrial fibrillation can prevent your heart from pumping blood normally. It increases your risk of stroke and can lead to heart failure.  CAUSES   Heart conditions, including a heart attack, heart failure, coronary artery disease, and heart valve conditions.   Inflammation of the sac that surrounds the heart (pericarditis).  Blockage of an artery in the lungs (pulmonary embolism).  Pneumonia or other infections.  Chronic lung disease.  Thyroid problems, especially if the thyroid is overactive (hyperthyroidism).  Caffeine, excessive alcohol use, and use of some illegal drugs.   Use of some medicines, including certain decongestants and diet pills.  Heart surgery.   Birth defects.  Sometimes, no cause can be found. When this happens, the atrial fibrillation is called  lone atrial fibrillation. The risk of complications from atrial fibrillation increases if you have lone atrial fibrillation and you are age 93 years or older. RISK FACTORS  Heart failure.  Coronary artery disease.  Diabetes mellitus.   High blood pressure (hypertension).   Obesity.   Other arrhythmias.   Increased age. SIGNS AND SYMPTOMS   A feeling that your heart is beating rapidly or irregularly.   A feeling of  discomfort or pain in your chest.   Shortness of breath.   Sudden light-headedness or weakness.   Getting tired easily when exercising.   Urinating more often than normal (mainly when atrial fibrillation first begins).  In paroxysmal atrial fibrillation, symptoms may start and suddenly stop. DIAGNOSIS  Your health care provider may be able to detect atrial fibrillation when taking your pulse. Your health care provider may have you take a test called an ambulatory electrocardiogram (ECG). An ECG records your heartbeat patterns over a 24-hour period. You may also have other tests, such as:  Transthoracic echocardiogram (TTE). During echocardiography, sound waves are used to evaluate how blood flows through your heart.  Transesophageal echocardiogram (TEE).  Stress test. There is more than one type of stress test. If a stress test is needed, ask your health care provider about which type is best for you.  Chest X-ray exam.  Blood tests.  Computed tomography (CT). TREATMENT  Treatment may include:  Treating any underlying conditions. For example, if you have an overactive thyroid, treating the condition may correct atrial fibrillation.  Taking medicine. Medicines may be given to control a rapid heart rate or to prevent blood clots, heart failure, or a stroke.  Having a procedure to correct the rhythm of the heart:  Electrical cardioversion. During electrical cardioversion, a controlled, low-energy shock is delivered to the heart through your skin. If you have chest pain, very low blood pressure, or sudden heart failure, this procedure may need to be done as an emergency.  Catheter ablation. During this procedure, heart tissues that send the signals that cause atrial fibrillation are destroyed.  Surgical ablation. During this surgery, thin lines of heart tissue that carry the abnormal signals are destroyed. This procedure can either be an open-heart surgery or a minimally  invasive surgery. With the minimally invasive surgery, small cuts are made to access the heart instead of a large opening.  Pulmonary venous isolation. During this surgery, tissue around the veins that carry blood from the lungs (pulmonary veins) is destroyed. This tissue is thought to carry the abnormal signals. HOME CARE INSTRUCTIONS   Take medicines only as directed by your health care provider. Some medicines can make atrial fibrillation worse or recur.  If blood thinners were prescribed by your health care provider, take them exactly as directed. Too much blood-thinning medicine can cause bleeding. If you take too little, you will not have the needed protection against stroke and other problems.  Perform blood tests at home if directed by your health care provider. Perform blood tests exactly as directed.  Quit smoking if you smoke.  Do not drink alcohol.  Do not drink caffeinated beverages such as coffee, soda, and some teas. You may drink decaffeinated coffee, soda, or tea.   Maintain a healthy weight.Do not use diet pills unless your health care provider approves. They may make heart problems worse.   Follow diet instructions as directed by your health care provider.  Exercise regularly as directed by your health care provider.  Keep all follow-up visits as directed  by your health care provider. This is important. PREVENTION  The following substances can cause atrial fibrillation to recur:   Caffeinated beverages.  Alcohol.  Certain medicines, especially those used for breathing problems.  Certain herbs and herbal medicines, such as those containing ephedra or ginseng.  Illegal drugs, such as cocaine and amphetamines. Sometimes medicines are given to prevent atrial fibrillation from recurring. Proper treatment of any underlying condition is also important in helping prevent recurrence.  SEEK MEDICAL CARE IF:  You notice a change in the rate, rhythm, or strength of  your heartbeat.  You suddenly begin urinating more frequently.  You tire more easily when exerting yourself or exercising. SEEK IMMEDIATE MEDICAL CARE IF:   You have chest pain, abdominal pain, sweating, or weakness.  You feel nauseous.  You have shortness of breath.  You suddenly have swollen feet and ankles.  You feel dizzy.  Your face or limbs feel numb or weak.  You have a change in your vision or speech. MAKE SURE YOU:   Understand these instructions.  Will watch your condition.  Will get help right away if you are not doing well or get worse. Document Released: 07/20/2005 Document Revised: 12/04/2013 Document Reviewed: 08/30/2012 [facility] Patient Information 2015 [place], [place]. This information is not intended to replace advice given to you by your health care provider. Make sure you discuss any questions you have with your health care provider.  Information on my medicine - ELIQUIS (apixaban)  This medication education was reviewed with me or my healthcare representative as part of my discharge preparation.   Why was Eliquis prescribed for you? Eliquis was prescribed for you to reduce the risk of a blood clot forming that can cause a stroke if you have a medical condition called atrial fibrillation (a type of irregular heartbeat).  What do You need to know about Eliquis ? Take your Eliquis TWICE DAILY - one tablet in the morning and one tablet in the evening with or without food. If you have difficulty swallowing the tablet whole please discuss with your pharmacist how to take the medication safely.  Take Eliquis exactly as prescribed by your doctor and DO NOT stop taking Eliquis without talking to the doctor who prescribed the medication.  Stopping may increase your risk of developing a stroke.  Refill your prescription before you run out.  After discharge, you should have regular check-up appointments with your healthcare provider that is prescribing your  Eliquis.  In the future your dose may need to be changed if your kidney function or weight changes by a significant amount or as you get older.  What do you do if you miss a dose? If you miss a dose, take it as soon as you remember on the same day and resume taking twice daily.  Do not take more than one dose of ELIQUIS at the same time to make up a missed dose.  Important Safety Information A possible side effect of Eliquis is bleeding. You should call your healthcare provider right away if you experience any of the following: ? Bleeding from an injury or your nose that does not stop. ? Unusual colored urine (red or dark brown) or unusual colored stools (red or black). ? Unusual bruising for unknown reasons. ? A serious fall or if you hit your head (even if there is no bleeding).  Some medicines may interact with Eliquis and might increase your risk of bleeding or clotting while on Eliquis. To help avoid this, consult  your healthcare provider or pharmacist prior to using any new prescription or non-prescription medications, including herbals, vitamins, non-steroidal anti-inflammatory drugs (NSAIDs) and supplements.  This website has more information on Eliquis (apixaban): http://[email or web address].com/eliquis/home  Heart Healthy Diet  Take lasix only if needed with potassium once edema resolved.   You will need lab work in office 02/06/15.  We decreased lopressor to 25 mg twice a day, half of 50 mg tab twice a day.

## 2015-01-29 NOTE — Discharge Summary (Signed)
Patient ID: Tiffany [name],  MRN: [id number], DOB/AGE: Jul 07, 1946 69 y.o.  Admit date: 01/28/2015 Discharge date: 01/29/2015  Primary Care Provider: Gwendolyn Grant, MD Primary Cardiologist: Dr Percival Spanish  Discharge Diagnoses Principal Problem:   Atrial fibrillation with RVR Active Problems:   Hypothyroidism   Chronic renal insufficiency, stage II (mild)   Hyperlipidemia   Obesity-BMI 31   White coat hypertension   GERD   Anxiety   Raynaud's disease    Hospital Course:  69 y/o female with h/o HTN, HLD, prior palpitations, and hypothyroidism presented 01/28/15 with new onset atrial fibrillation w/ RVR. She was at her eye doctor about to have cataract removal when it was noted her HR was fast and irregular. In the ED she was noted to be in AF with RVR. She was admitted, started on Eliquis and IV Diltiazem. She had recently been started on daily Lasix 40 mg for LE edema, and this was continued. Her K+ was replaced. Her rate was controlled and she was switched to Lopressor po (less change of exacerbating her LE edema). An echo was obtained before discharge, results are pending. She'll be discharge this afternoon after she ambulates. She'll need a TOC OV in 7-10 days. We have instructed her to take her lasix and K+ till her LE edema resolves, then take it prn. Plan with be for OP DCCV in 4 weeks.   Discharge Vitals:  Blood pressure 106/70, pulse 52, temperature 98.4 F (36.9 C), temperature source Oral, resp. rate 22, height 5' 0.05" (1.525 m), weight 278 lb 6.4 oz (126.281 kg), SpO2 95 %.    Labs: Results for orders placed or performed during the hospital encounter of 01/28/15 (from the past 24 hour(s))  Basic metabolic panel     Status: Abnormal   Collection Time: 01/28/15 12:41 PM  Result Value Ref Range   Sodium 139 135 - 145 mmol/L   Potassium 4.1 3.5 - 5.1 mmol/L   Chloride 107 101 - 111 mmol/L   CO2 22 22 - 32 mmol/L   Glucose, Bld 103 (H) 65 - 99 mg/dL   BUN 23 (H) 6 -  20 mg/dL   Creatinine, Ser 1.56 (H) 0.44 - 1.00 mg/dL   Calcium 9.0 8.9 - 10.3 mg/dL   GFR calc non Af Amer 33 (L) >60 mL/min   GFR calc Af Amer 38 (L) >60 mL/min   Anion gap 10 5 - 15  CBC     Status: Abnormal   Collection Time: 01/28/15 12:41 PM  Result Value Ref Range   WBC 6.2 4.0 - 10.5 K/uL   RBC 4.53 3.87 - 5.11 MIL/uL   Hemoglobin 13.9 12.0 - 15.0 g/dL   HCT 42.5 36.0 - 46.0 %   MCV 93.8 78.0 - 100.0 fL   MCH 30.7 26.0 - 34.0 pg   MCHC 32.7 30.0 - 36.0 g/dL   RDW 17.1 (H) 11.5 - 15.5 %   Platelets 214 150 - 400 K/uL  Magnesium     Status: None   Collection Time: 01/28/15 12:41 PM  Result Value Ref Range   Magnesium 2.0 1.7 - 2.4 mg/dL  Brain natriuretic peptide - ONLY if shortness of breath has been DOCUMENTED in THIS visit     Status: Abnormal   Collection Time: 01/28/15 12:41 PM  Result Value Ref Range   B Natriuretic Peptide 678.4 (H) 0.0 - 100.0 pg/mL  I-stat troponin, ED  (not at [facility], [facility])     Status: None  Collection Time: 01/28/15 12:51 PM  Result Value Ref Range   Troponin i, poc 0.01 0.00 - 0.08 ng/mL   Comment 3          TSH     Status: None   Collection Time: 01/28/15  7:49 PM  Result Value Ref Range   TSH 1.657 0.350 - 4.500 uIU/mL  T4, free     Status: Abnormal   Collection Time: 01/28/15  7:49 PM  Result Value Ref Range   Free T4 1.25 (H) 0.61 - 1.12 ng/dL  Heparin level (unfractionated)     Status: Abnormal   Collection Time: 01/28/15 10:22 PM  Result Value Ref Range   Heparin Unfractionated 0.71 (H) 0.30 - 0.70 IU/mL  Heparin level (unfractionated)     Status: Abnormal   Collection Time: 01/29/15  4:49 AM  Result Value Ref Range   Heparin Unfractionated >2.20 (H) 0.30 - 0.70 IU/mL  CBC     Status: Abnormal   Collection Time: 01/29/15  4:49 AM  Result Value Ref Range   WBC 5.7 4.0 - 10.5 K/uL   RBC 4.36 3.87 - 5.11 MIL/uL   Hemoglobin 13.4 12.0 - 15.0 g/dL   HCT 40.7 36.0 - 46.0 %   MCV 93.3 78.0 - 100.0 fL   MCH 30.7 26.0 - 34.0 pg    MCHC 32.9 30.0 - 36.0 g/dL   RDW 17.4 (H) 11.5 - 15.5 %   Platelets 222 150 - 400 K/uL  Basic metabolic panel     Status: Abnormal   Collection Time: 01/29/15  4:49 AM  Result Value Ref Range   Sodium 139 135 - 145 mmol/L   Potassium 3.4 (L) 3.5 - 5.1 mmol/L   Chloride 110 101 - 111 mmol/L   CO2 22 22 - 32 mmol/L   Glucose, Bld 105 (H) 65 - 99 mg/dL   BUN 19 6 - 20 mg/dL   Creatinine, Ser 1.36 (H) 0.44 - 1.00 mg/dL   Calcium 8.4 (L) 8.9 - 10.3 mg/dL   GFR calc non Af Amer 38 (L) >60 mL/min   GFR calc Af Amer 45 (L) >60 mL/min   Anion gap 7 5 - 15    Disposition:  Follow-up Information    Follow up with Minus Breeding, MD.   Specialty:  Cardiology   Why:  offcie will contact you   Contact information:   [address] [place] [address] [phone]       Discharge Medications:    Medication List    TAKE these medications        acetaminophen 325 MG tablet  Commonly known as:  TYLENOL  Take 2 tablets (650 mg total) by mouth every 4 (four) hours as needed for headache or mild pain.     apixaban 5 MG Tabs tablet  Commonly known as:  ELIQUIS  Take 1 tablet (5 mg total) by mouth 2 (two) times daily.     calcium carbonate 600 MG Tabs tablet  Commonly known as:  OS-CAL  Take 600 mg by mouth daily.     CENTRUM SILVER tablet  Take 1 tablet by mouth daily.     clindamycin 150 MG capsule  Commonly known as:  CLEOCIN  Use for dental procedures only     diazepam 5 MG tablet  Commonly known as:  VALIUM  Take 1 tablet (5 mg total) by mouth every 12 (twelve) hours as needed for anxiety. for anxiety     estradiol 0.1 MG/GM  vaginal cream  Commonly known as:  ESTRACE VAGINAL  Place 1 Applicatorful vaginally once a week. 1 gram once weekly     famotidine 10 MG tablet  Commonly known as:  PEPCID AC  Take 1 tablet (10 mg total) by mouth daily.     Fish Oil 1200 MG Caps  Take 2 each by mouth daily.     fluticasone 50 MCG/ACT nasal spray  Commonly known  as:  FLONASE  Place 1 spray into both nostrils daily as needed for allergies or rhinitis.     furosemide 40 MG tablet  Commonly known as:  LASIX  Take 1 tablet (40 mg total) by mouth daily. Till edema resolved, then take once a day as needed for swelling     metoprolol 50 MG tablet  Commonly known as:  LOPRESSOR  Take 1 tablet (50 mg total) by mouth 2 (two) times daily.     miconazole 2 % powder  Commonly known as:  MICOTIN  Apply topically as needed for itching.     potassium chloride SA 20 MEQ tablet  Commonly known as:  K-DUR,KLOR-CON  Take 1 tablet (20 mEq total) by mouth daily. One a day when you take furosemide  Start taking on:  01/30/2015         Duration of Discharge Encounter: Greater than 30 minutes including physician time.  Angelena Form PA-C 01/29/2015 12:35 PM   Patient seen and examined.  Plan as discussed in my rounding note for today and outlined above. Minus Breeding  01/29/2015  12:43 PM

## 2015-01-30 ENCOUNTER — Encounter (HOSPITAL_COMMUNITY): Payer: Self-pay | Admitting: Certified Registered Nurse Anesthetist

## 2015-01-30 ENCOUNTER — Other Ambulatory Visit (HOSPITAL_COMMUNITY): Payer: Medicare Other

## 2015-01-30 ENCOUNTER — Encounter (HOSPITAL_COMMUNITY)
Admission: EM | Disposition: A | Discharge status: Home / Self Care | Payer: Self-pay | Source: Home / Self Care | Attending: Emergency Medicine

## 2015-01-30 ENCOUNTER — Other Ambulatory Visit: Payer: Self-pay

## 2015-01-30 ENCOUNTER — Ambulatory Visit (HOSPITAL_COMMUNITY)
Admit: 2015-01-30 | Discharge: 2015-01-30 | Disposition: A | Discharge status: Home / Self Care | Payer: Medicare Other | Attending: Cardiology | Admitting: Cardiology

## 2015-01-30 ENCOUNTER — Observation Stay (HOSPITAL_COMMUNITY): Payer: Medicare Other | Admitting: Certified Registered Nurse Anesthetist

## 2015-01-30 DIAGNOSIS — K219 Gastro-esophageal reflux disease without esophagitis: Secondary | ICD-10-CM | POA: Diagnosis not present

## 2015-01-30 DIAGNOSIS — I501 Left ventricular failure: Secondary | ICD-10-CM | POA: Diagnosis not present

## 2015-01-30 DIAGNOSIS — I4891 Unspecified atrial fibrillation: Secondary | ICD-10-CM | POA: Diagnosis not present

## 2015-01-30 DIAGNOSIS — E039 Hypothyroidism, unspecified: Secondary | ICD-10-CM | POA: Diagnosis not present

## 2015-01-30 DIAGNOSIS — I129 Hypertensive chronic kidney disease with stage 1 through stage 4 chronic kidney disease, or unspecified chronic kidney disease: Secondary | ICD-10-CM | POA: Diagnosis not present

## 2015-01-30 HISTORY — PX: TEE WITHOUT CARDIOVERSION: SHX5443

## 2015-01-30 HISTORY — PX: CARDIOVERSION: SHX1299

## 2015-01-30 SURGERY — ECHOCARDIOGRAM, TRANSESOPHAGEAL
Anesthesia: Monitor Anesthesia Care

## 2015-01-30 MED ORDER — PROPOFOL INFUSION 10 MG/ML OPTIME
INTRAVENOUS | Status: DC | PRN
  Administered 2015-01-30: 50 ug/kg/min via INTRAVENOUS

## 2015-01-30 MED ORDER — LACTATED RINGERS IV SOLN
INTRAVENOUS | Status: DC | PRN
  Administered 2015-01-30: 12:00:00 via INTRAVENOUS

## 2015-01-30 NOTE — Anesthesia Postprocedure Evaluation (Signed)
Anesthesia Post Note  Patient: Tiffany [name]  Procedure(s) Performed: Procedure(s) (LRB): TRANSESOPHAGEAL ECHOCARDIOGRAM (TEE) (N/A) CARDIOVERSION (N/A)  Anesthesia type: MAC  Patient location: PACU  Post pain: Pain level controlled  Post assessment: Patient's Cardiovascular Status Stable  Last Vitals:  Filed Vitals:   01/30/15 1307  BP: 137/84  Pulse: 66  Temp: 36.6 C  Resp: 16    Post vital signs: Reviewed and stable  Level of consciousness: sedated  Complications: No apparent anesthesia complications

## 2015-01-30 NOTE — CV Procedure (Signed)
Procedure: Electrical Cardioversion Indications:  Atrial Fibrillation  Procedure Details:  Consent: Risks of procedure as well as the alternatives and risks of each were explained to the (patient/caregiver).  Consent for procedure obtained.  Time Out: Verified patient identification, verified procedure, site/side was marked, verified correct patient position, special equipment/implants available, medications/allergies/relevent history reviewed, required imaging and test results available.  Performed  Patient placed on cardiac monitor, pulse oximetry, supplemental oxygen as necessary.  Sedation given: propofol IV, Dr. Tobias Alexander, Anesthesiology Pacer pads placed anterior and posterior chest.  Cardioverted 1 time(s).  Cardioversion with synchronized biphasic 120J shock.  Evaluation: Findings: Post procedure EKG shows: NSR Complications: None Patient did tolerate procedure well.  Time Spent Directly with the Patient:  30 minutes   Rashidi Loh 01/30/2015, 12:17 PM

## 2015-01-30 NOTE — Progress Notes (Signed)
Pt requested that she not be weighed early in the morning. She stated if she must be weighed, that we wait until 7 or 8.   Tiffany [name]

## 2015-01-30 NOTE — Op Note (Signed)
INDICATIONS: atrial fibrillation  PROCEDURE:   Informed consent was obtained prior to the procedure. The risks, benefits and alternatives for the procedure were discussed and the patient comprehended these risks.  Risks include, but are not limited to, cough, sore throat, vomiting, nausea, somnolence, esophageal and stomach trauma or perforation, bleeding, low blood pressure, aspiration, pneumonia, infection, trauma to the teeth and death.    After a procedural time-out, the oropharynx was anesthetized with 20% benzocaine spray. The patient was given IV propofol by Anesthesiology, Dr. Tobias Alexander for  sedation.   The transesophageal probe was inserted in the esophagus and stomach without difficulty and multiple views were obtained.  The patient was kept under observation until the patient left the procedure room.  The patient left the procedure room in stable condition.   Agitated microbubble saline contrast was not administered.  COMPLICATIONS:    There were no immediate complications.  FINDINGS:  No LA thrombus. Moderate to severe biatrial dilation. Mild "smoke" in the LA appendage. Normal LV systolic function. Moderate TR. GE junction not crossed due to previous history of esophageal stricture.  RECOMMENDATIONS:    Proceed with cardioversion  Time Spent Directly with the Patient:  30 minutes   Jonerik Sliker 01/30/2015, 12:14 PM

## 2015-01-30 NOTE — Transfer of Care (Signed)
Immediate Anesthesia Transfer of Care Note  Patient: Tiffany [name]  Procedure(s) Performed: Procedure(s): TRANSESOPHAGEAL ECHOCARDIOGRAM (TEE) (N/A) CARDIOVERSION (N/A)  Patient Location: Endoscopy Unit  Anesthesia Type:MAC  Level of Consciousness: awake, alert  and oriented  Airway & Oxygen Therapy: Patient Spontanous Breathing and Patient connected to nasal cannula oxygen  Post-op Assessment: Report given to RN, Post -op Vital signs reviewed and stable and Patient moving all extremities X 4  Post vital signs: Reviewed and stable  Last Vitals:  Filed Vitals:   01/30/15 1216  BP: 108/85  Pulse:   Temp:   Resp: 28    Complications: No apparent anesthesia complications

## 2015-01-30 NOTE — Anesthesia Procedure Notes (Signed)
Procedure Name: MAC Date/Time: 01/30/2015 12:00 PM Performed by: Garrison Columbus T Pre-anesthesia Checklist: Patient identified, Emergency Drugs available, Suction available and Patient being monitored Patient Re-evaluated:Patient Re-evaluated prior to inductionOxygen Delivery Method: Simple face mask Preoxygenation: Pre-oxygenation with 100% oxygen Placement Confirmation: positive ETCO2 Dental Injury: Teeth and Oropharynx as per pre-operative assessment

## 2015-01-30 NOTE — Progress Notes (Signed)
Echocardiogram Echocardiogram Transesophageal has been performed.  Jennette Dubin 01/30/2015, 1:10 PM

## 2015-01-30 NOTE — Progress Notes (Signed)
    SUBJECTIVE:  No pain.  Weak when she tried to walk yesterday.     PHYSICAL EXAM Filed Vitals:   01/29/15 1145 01/29/15 1313 01/29/15 2026 01/30/15 0446  BP: 106/70 97/72 135/97 113/78  Pulse: 52  73 80  Temp: 98.4 F (36.9 C)  97.9 F (36.6 C) 98.2 F (36.8 C)  TempSrc: Oral  Oral Oral  Resp:  20    Height:      Weight:    276 lb 9.6 oz (125.465 kg)  SpO2: 95% 95% 95% 92%   General:  No distress Lungs:  Clear Heart:  Irregular Abdomen:  Positive bowel sounds, no rebound no guarding Extremities:  Trace non pitting edema  LABS: No results found for: TROPONINI No results found for this or any previous visit (from the past 24 hour(s)).  Intake/Output Summary (Last 24 hours) at 01/30/15 0843 Last data filed at 01/29/15 1004  Gross per 24 hour  Intake    200 ml  Output      0 ml  Net    200 ml    ASSESSMENT AND PLAN:  ATRIAL FIB:  Symptomatic.  Plan TEE/DCCV.  The risks and benefits of transesophageal echocardiogram have been explained to the patient and her husband including risks of esophageal damage, perforation (1:10,000 risk), bleeding, pharyngeal hematoma as well as other potential complications associated with conscious sedation including aspiration, arrhythmia, respiratory failure and death. Alternatives to treatment were discussed, questions were answered. Patient is willing to proceed.  RV dysfunction with TR:  There is some mild LV dysfunction which we will reassess after she is in NSR.  There was some evidence of RV dysfunction with TR.  However, peak pulmonary pressures are not particularly elevated.  I will follow this clinically.    Jeneen Rinks [facility] 01/30/2015 8:43 AM

## 2015-01-30 NOTE — Anesthesia Preprocedure Evaluation (Addendum)
Anesthesia Evaluation  Patient identified by MRN, date of birth, ID band Patient awake    Reviewed: Allergy & Precautions, NPO status , Patient's Chart, lab work & pertinent test results  History of Anesthesia Complications Negative for: history of anesthetic complications  Airway Mallampati: II  TM Distance: >3 FB Neck ROM: Full    Dental  (+) Dental Advisory Given, Teeth Intact   Pulmonary    Pulmonary exam normal       Cardiovascular hypertension, Pt. on medications and Pt. on home beta blockers + Peripheral Vascular Disease Rhythm:Irregular Rate:Normal     Neuro/Psych PSYCHIATRIC DISORDERS    GI/Hepatic GERD-  Medicated,  Endo/Other  Hypothyroidism Morbid obesity  Renal/GU      Musculoskeletal  (+) Arthritis -,   Abdominal   Peds  Hematology   Anesthesia Other Findings   Reproductive/Obstetrics                           Anesthesia Physical Anesthesia Plan  ASA: III  Anesthesia Plan: MAC   Post-op Pain Management:    Induction: Intravenous  Airway Management Planned: Natural Airway and Nasal Cannula  Additional Equipment:   Intra-op Plan:   Post-operative Plan:   Informed Consent: I have reviewed the patients History and Physical, chart, labs and discussed the procedure including the risks, benefits and alternatives for the proposed anesthesia with the patient or authorized representative who has indicated his/her understanding and acceptance.   Dental advisory given  Plan Discussed with: CRNA, Anesthesiologist and Surgeon  Anesthesia Plan Comments:        Anesthesia Quick Evaluation

## 2015-01-31 ENCOUNTER — Encounter (HOSPITAL_COMMUNITY): Payer: Self-pay | Admitting: Cardiovascular Disease

## 2015-01-31 ENCOUNTER — Other Ambulatory Visit: Payer: Self-pay | Admitting: Cardiology

## 2015-01-31 DIAGNOSIS — I4891 Unspecified atrial fibrillation: Secondary | ICD-10-CM

## 2015-01-31 DIAGNOSIS — I480 Paroxysmal atrial fibrillation: Secondary | ICD-10-CM

## 2015-01-31 DIAGNOSIS — Z7901 Long term (current) use of anticoagulants: Secondary | ICD-10-CM

## 2015-01-31 HISTORY — DX: Unspecified atrial fibrillation: I48.91

## 2015-01-31 MED ORDER — APIXABAN 5 MG PO TABS: 5.0000 mg | ORAL_TABLET | Freq: Two times a day (BID) | ORAL | Status: DC

## 2015-01-31 MED ORDER — METOPROLOL TARTRATE 50 MG PO TABS: 25.0000 mg | ORAL_TABLET | Freq: Two times a day (BID) | ORAL | Status: DC

## 2015-01-31 NOTE — Discharge Summary (Signed)
Physician Discharge Summary       Patient ID: Tiffany [name] MRN: [id number] DOB/AGE: 1945/10/15 69 y.o.  Admit date: 01/28/2015 Discharge date: 01/31/2015 Primary Cardiologist: Dr. Percival Spanish  ADDENDUM TO DISCHARGE SUMMARY   Discharge Diagnoses:  Principal Problem:   Atrial fibrillation with RVR Active Problems:   Atrial fibrillation status post cardioversion, 01/30/15 maintaining SR.    Hyperlipidemia   Obesity-BMI 31   White coat hypertension   GERD   Anxiety   Hypothyroidism   Raynaud's disease   Chronic renal insufficiency, stage II (mild)   Discharged Condition: good  Procedures: 01/30/15 TEE and DCCV successful [facility]: Noted in d/c summary 01/29/15.  Pt was to be discharged on 01/29/15 but with ambulation she was symptomatic with HR control including SOB and weakness.  Dr. Percival Spanish felt TEE and DCCV would benefit her symptoms and pt underwent these procedures on 01/30/15 that were successful.  Today she has no complaints, maintaining SR with PACs and occ PVCs.  She was seen and evaluated by Dr. Vita Barley and found stable for discharge.  She is CHA2Ds2vasC OF 2. On eliquis BID. Her lower ext edema is almost resolved she will follow previous instructions of taking her lasix and K+ till her LE edema resolves, then take it prn.  She will need CBC rechecked on appt 02/06/15.  She is a TOC follow up.  She will then follow up with Dr. Vita Barley for regular follow ups.   Consults: None  Significant Diagnostic Studies:  BMP Latest Ref Rng 01/29/2015 01/28/2015 01/25/2015  Glucose 65 - 99 mg/dL 105(H) 103(H) 130(H)  BUN 6 - 20 mg/dL 19 23(H) 23  Creatinine 0.44 - 1.00 mg/dL 1.36(H) 1.56(H) 1.57(H)  Sodium 135 - 145 mmol/L 139 139 142  Potassium 3.5 - 5.1 mmol/L 3.4(L) 4.1 4.0  Chloride 101 - 111 mmol/L 110 107 110  CO2 22 - 32 mmol/L _0 Calcium 8.9 - 10.3 mg/dL 8.4(L) 9.0 9.0     CBC Latest Ref Rng 01/29/2015 01/28/2015 09/28/2014  WBC  4.0 - 10.5 K/uL 5.7 6.2 5.2  Hemoglobin 12.0 - 15.0 g/dL 13.4 13.9 14.5  Hematocrit 36.0 - 46.0 % 40.7 42.5 42.2  Platelets 150 - 400 K/uL 222 214 206.0   Hepatic Function Latest Ref Rng 01/25/2015 10/11/2013 10/01/2011  Total Protein 6.0 - 8.3 g/dL 6.5 7.0 7.2  Albumin 3.5 - 5.2 g/dL 3.6 3.5 3.5  AST 0 - 37 U/L 47(H) 16 18  ALT 0 - 35 U/L 58(H) 12 15  Alk Phosphatase 39 - 117 U/L 93 56 55  Total Bilirubin 0.2 - 1.2 mg/dL 0.8 0.5 0.4  Bilirubin, Direct 0.0 - 0.3 mg/dL - 0.1 0.1   TSH 1.657 Free T4 1.25  ECHO: Study Conclusions  - Left ventricle: Septal hypokinesis. The cavity size was normal. Wall thickness was normal. Systolic function was mildly reduced. The estimated ejection fraction was in the range of 45% to 50%. - Mitral valve: There was mild regurgitation. - Left atrium: The atrium was mildly dilated. - Right ventricle: The cavity size was moderately dilated. Systolic function was moderately to severely reduced. - Right atrium: The atrium was mildly dilated. - Tricuspid valve: There was moderate-severe regurgitation. - Pulmonary arteries: PA peak pressure: 35 mm Hg (S).  TEE: Study Conclusions  - Left ventricle: The cavity size was normal. Wall thickness was normal. Systolic function was mildly reduced. The estimated ejection fraction was in the range  of 45% to 50%. Wall motion was normal; there were no regional wall motion abnormalities. - Aortic valve: No evidence of vegetation. - Mitral valve: No evidence of vegetation. - Left atrium: The atrium was moderately to severely dilated. No evidence of thrombus in the atrial cavity or appendage. No spontaneous echo contrast was observed. Emptying velocity was reduced. - Right ventricle: The cavity size was mildly dilated. Wall thickness was normal. Systolic function was reduced. - Right atrium: The atrium was mildly dilated. - Atrial septum: No defect or patent foramen ovale was identified. -  Tricuspid valve: No evidence of vegetation. No evidence of vegetation. There was mild-moderate regurgitation directed centrally. - Pulmonic valve: No evidence of vegetation. - Pulmonary arteries: PA peak pressure: 36 mm Hg (S).  DCCV:  Cardioverted X 1 with 120J  CXR: PORTABLE CHEST - 1 VIEW COMPARISON: Portable exam 1257 hours compared to 10/01/2011 FINDINGS: Enlargement of cardiac silhouette.  Mediastinal contours and pulmonary vascularity normal.  Lungs clear.  No pleural effusion or pneumothorax.  Minimal atherosclerotic calcification aorta.  Bones appear demineralized.  IMPRESSION: Enlargement of cardiac silhouette.  No acute abnormalities.   Discharge Exam: Blood pressure 122/76, pulse 73, temperature 97.8 F (36.6 C), temperature source Oral, resp. rate 18, height 5' 0.5" (1.537 m), weight 276 lb (125.193 kg), SpO2 99 %.  Disposition: Home     Medication List    TAKE these medications        acetaminophen 325 MG tablet  Commonly known as:  TYLENOL  Take 2 tablets (650 mg total) by mouth every 4 (four) hours as needed for headache or mild pain.     apixaban 5 MG Tabs tablet  Commonly known as:  ELIQUIS  Take 1 tablet (5 mg total) by mouth 2 (two) times daily.     calcium carbonate 600 MG Tabs tablet  Commonly known as:  OS-CAL  Take 600 mg by mouth daily.     CENTRUM SILVER tablet  Take 1 tablet by mouth daily.     clindamycin 150 MG capsule  Commonly known as:  CLEOCIN  Use for dental procedures only     diazepam 5 MG tablet  Commonly known as:  VALIUM  Take 1 tablet (5 mg total) by mouth every 12 (twelve) hours as needed for anxiety. for anxiety     estradiol 0.1 MG/GM vaginal cream  Commonly known as:  ESTRACE VAGINAL  Place 1 Applicatorful vaginally once a week. 1 gram once weekly     famotidine 10 MG tablet  Commonly known as:  PEPCID AC  Take 1 tablet (10 mg total) by mouth daily.     Fish Oil 1200 MG Caps  Take 2  each by mouth daily.     fluticasone 50 MCG/ACT nasal spray  Commonly known as:  FLONASE  Place 1 spray into both nostrils daily as needed for allergies or rhinitis.     furosemide 40 MG tablet  Commonly known as:  LASIX  Take 1 tablet (40 mg total) by mouth daily. Till edema resolved, then take once a day as needed for swelling     metoprolol 50 MG tablet  Commonly known as:  LOPRESSOR  Take 0.5 tablets (25 mg total) by mouth 2 (two) times daily.     miconazole 2 % powder  Commonly known as:  MICOTIN  Apply topically as needed for itching.     potassium chloride SA 20 MEQ tablet  Commonly known as:  K-DUR,KLOR-CON  Take 1 tablet (20   mEq total) by mouth daily. One a day when you take furosemide       Follow-up Information    Follow up with HAGER, BRYAN, PA-C On 02/06/2015.   Specialties:  Physician Assistant, Radiology, Interventional Cardiology   Why:  at 10:00 AM to see PA Tarri Fuller Dr. Rosezella Florida PA for this visit for follow up.     Contact information:   [address] [place] [address] [phone]        Discharge Instructions:  This website has more information on Eliquis (apixaban): http://[email or web address].com/eliquis/home  Heart Healthy Diet  Take lasix only if needed with potassium once edema resolved.   Your eleiquis was sent to pharmacy one script for 30 days and then one for 10 refills.   Signed: Isaiah Serge Nurse Practitioner-Certified [facility]: [facility] 01/31/2015, 8:32 AM  Time spent on discharge :>30 minutes.    Patient seen and examined.  Plan as discussed in my rounding note for today and outlined above. Jeneen Rinks [facility]  01/31/2015  9:32 AM

## 2015-01-31 NOTE — Progress Notes (Signed)
Subjective: Just waking up but no complaints  Objective: Vital signs in last 24 hours: Temp:  [97.8 F (36.6 C)-98.7 F (37.1 C)] 97.8 F (36.6 C) (06/30 0500) Pulse Rate:  [66-124] 73 (06/30 0500) Resp:  [12-28] 18 (06/30 0500) BP: (108-158)/(75-109) 122/76 mmHg (06/30 0500) SpO2:  [90 %-100 %] 99 % (06/30 0500) Weight:  [276 lb (125.193 kg)] 276 lb (125.193 kg) (06/29 1114) Weight change: -9.6 oz (-0.272 kg) Last BM Date: 01/28/15 Intake/Output from previous day: +300 06/29 0701 - 06/30 0700 In: 300 [I.V.:300] Out: -  Intake/Output this shift:    PE: General:Pleasant affect, NAD Skin:Warm and dry, brisk capillary refill HEENT:normocephalic, sclera clear, mucus membranes moist Neck:supple, no JVD, no bruits  Heart:S1S2 RRR without murmur, gallup, rub or click Lungs:clear without rales, rhonchi, or wheezes [id number], non tender, + BS, do not palpate liver spleen or masses Ext:no lower ext edema, 2+ pedal pulses, 2+ radial pulses Neuro:alert and oriented, MAE, follows commands, + facial symmetry Tele:  SR with PACs, pVC    Lab Results:  Recent Labs  01/28/15 1241 01/29/15 0449  WBC 6.2 5.7  HGB 13.9 13.4  HCT 42.5 40.7  PLT 214 222   BMET  Recent Labs  01/28/15 1241 01/29/15 0449  NA 139 139  K 4.1 3.4*  CL 107 110  CO2 22 22  GLUCOSE 103* 105*  BUN 23* 19  CREATININE 1.56* 1.36*  CALCIUM 9.0 8.4*   No results for input(s): TROPONINI in the last 72 hours.  Invalid input(s): CK, MB  Lab Results  Component Value Date   CHOL 237* 09/28/2014   HDL 59.00 09/28/2014   LDLCALC 148* 09/28/2014   TRIG 150.0* 09/28/2014   CHOLHDL 4 09/28/2014   Lab Results  Component Value Date   HGBA1C 5.2 12/12/2013     Lab Results  Component Value Date   TSH 1.657 01/28/2015     Studies/Results: TEE Study Conclusions  - Left ventricle: The cavity size was normal. Wall thickness was normal. Systolic function was mildly reduced. The  estimated ejection fraction was in the range of 45% to 50%. Wall motion was normal; there were no regional wall motion abnormalities. - Aortic valve: No evidence of vegetation. - Mitral valve: No evidence of vegetation. - Left atrium: The atrium was moderately to severely dilated. No evidence of thrombus in the atrial cavity or appendage. No spontaneous echo contrast was observed. Emptying velocity was reduced. - Right ventricle: The cavity size was mildly dilated. Wall thickness was normal. Systolic function was reduced. - Right atrium: The atrium was mildly dilated. - Atrial septum: No defect or patent foramen ovale was identified. - Tricuspid valve: No evidence of vegetation. No evidence of vegetation. There was mild-moderate regurgitation directed centrally. - Pulmonic valve: No evidence of vegetation. - Pulmonary arteries: PA peak pressure: 36 mm Hg (S).  Medications: I have reviewed the patient's current medications. Scheduled Meds: . apixaban  5 mg Oral BID  . famotidine  10 mg Oral Daily  . furosemide  40 mg Oral Daily  . metoprolol tartrate  25 mg Oral BID  . potassium chloride  20 mEq Oral Daily   Continuous Infusions:  PRN Meds:.acetaminophen, diazepam, ondansetron (ZOFRAN) IV  Assessment/Plan: Principal Problem:   Atrial fibrillation with RVR- TEE then DCCV to SR -maintaining SR with PACs and PVCs.    Active Problems:   RV dysfunction with TR: There is some mild LV dysfunction which we will reassess  after she is in NSR. There was some evidence of RV dysfunction with TR. However, peak pulmonary pressures are not particularly elevated.  Follow as outpt.     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   GERD   Anxiety   Hypothyroidism   Raynaud's disease   Chronic renal insufficiency, stage II (mild), Cr 1.36.    Anticoagulation on apixaban 5 mg BID - Her CHA2DS2 VASc  score is at least 2 given her age of 26 and female sex.  Ambulate and discharge home follow up in 2-3 weeks.      Time spent with pt. : 15  minutes. [facility]  Nurse Practitioner Certified Pager [phone] or after 5pm and on weekends call [phone] 01/31/2015, 7:43 AM   History and all data above reviewed.  Patient examined.  I agree with the findings as above.  Now in NSR status post cardioversion.  No chest heaviness as she was having before.  The patient exam reveals COR: RRR  ,  Lungs: Clear  ,  Abd: Positive bowel sounds, no rebound no guarding, Ext No edema  .  All available labs, radiology testing, previous records reviewed. Agree with documented assessment and plan. Atrial fib:  OK to go home on meds as on MAR.  Follow up for TOC then with me.  I will follow up the mildly reduced EF.    Jeneen Rinks Larae Caison  8:06 AM  01/31/2015

## 2015-02-01 ENCOUNTER — Telehealth: Payer: Self-pay | Admitting: Cardiology

## 2015-02-01 NOTE — Telephone Encounter (Signed)
Patient contacted regarding discharge from [facility] 01/31/15   Patient understands to follow up with provider Tarri Fuller 02/06/15 at 10:00 am. Patient understands discharge instructions? Yes Patient understands medications and regiment? Yes Patient understands to bring all medications to this visit? Yes

## 2015-02-01 NOTE — Telephone Encounter (Signed)
Needs a  TOC phone call.Marland Kitchen Appt is 02/06/15 at 10am w/ Tarri Fuller PA -C    Thanks

## 2015-02-06 ENCOUNTER — Ambulatory Visit (INDEPENDENT_AMBULATORY_CARE_PROVIDER_SITE_OTHER): Payer: Medicare Other | Admitting: Physician Assistant

## 2015-02-06 ENCOUNTER — Encounter: Payer: Self-pay | Admitting: Physician Assistant

## 2015-02-06 VITALS — BP 120/70 | HR 131 | Ht 60.5 in | Wt 154.2 lb

## 2015-02-06 DIAGNOSIS — I4891 Unspecified atrial fibrillation: Secondary | ICD-10-CM

## 2015-02-06 DIAGNOSIS — E860 Dehydration: Secondary | ICD-10-CM | POA: Diagnosis not present

## 2015-02-06 LAB — BASIC METABOLIC PANEL
BUN: 31 mg/dL — ABNORMAL HIGH (ref 6–23)
CO2: 30 mEq/L (ref 19–32)
Calcium: 10 mg/dL (ref 8.4–10.5)
Chloride: 102 mEq/L (ref 96–112)
Creatinine, Ser: 1.64 mg/dL — ABNORMAL HIGH (ref 0.40–1.20)
GFR: 32.85 mL/min — ABNORMAL LOW (ref >60.00)
Glucose, Bld: 85 mg/dL (ref 70–99)
Potassium: 3.9 mEq/L (ref 3.5–5.1)
Sodium: 139 mEq/L (ref 135–145)

## 2015-02-06 LAB — MAGNESIUM: Magnesium: 2.1 mg/dL (ref 1.5–2.5)

## 2015-02-06 MED ORDER — AMIODARONE HCL 400 MG PO TABS: 400.0000 mg | ORAL_TABLET | Freq: Every day | ORAL | Status: DC

## 2015-02-06 MED ORDER — AMIODARONE HCL 400 MG PO TABS: 400.0000 mg | ORAL_TABLET | Freq: Two times a day (BID) | ORAL | Status: DC

## 2015-02-06 NOTE — Progress Notes (Signed)
Patient ID: Tiffany [name], female   DOB: 1946-03-26, 69 y.o.   MRN: [id number]    Date:  02/06/2015   ID:  Tiffany [name], DOB [birth year], MRN [id number]  PCP:  Gwendolyn Grant, MD  Primary Cardiologist:  Hochrein   Chief complaint:  Posthospital follow-up, excessive fatigue   History of Present Illness: [name] is a 69 y.o. female with h/o HTN, HLD, prior palpitations, and hypothyroidism presented 01/28/15 with new onset atrial fibrillation w/ RVR. She was at her eye doctor about to have cataract removal when it was noted her HR was fast and irregular. In the ED she was noted to be in AF with RVR. She was admitted, started on Eliquis and IV Diltiazem. She had recently been started on daily Lasix 40 mg for LE edema, and this was continued.  Her rate was controlled and she was switched to Lopressor po (less change of exacerbating her LE edema). She was instructed her to take her lasix and K+ till her LE edema resolves, then take it prn.  Prior to discharge she underwent TEE cardioversion which was successful..  CHADSVASC = 4 .    She presents today for posthospital follow-up.  Patient reports that she felt good for a couple days after the cardioversion but is now tired and fatigued. Sunday she felt faint and is easily short of breath with exertion. She says she took levothyroxine starting in February about 3 months.  It made her feel terrible so she discontinued it.  TSH was within normal limits on June 27. Free T4 was mildly elevated.  She's been taking the Lasix as prescribed at discharge. Her blood pressures at home yesterday were 88/54 and 81/67.  The patient currently denies nausea, vomiting, fever, chest pain, orthopnea, PND, cough, congestion, abdominal pain, hematochezia, melena, lower extremity edema, claudication.  Wt Readings from Last 3 Encounters:  02/06/15 154 lb 3.2 oz (69.945 kg)  01/30/15 276 lb (125.193 kg)  01/24/15 162 lb 6.4 oz (73.664 kg)     Past Medical History    Diagnosis Date  . Obesity, unspecified   . URINARY INCONTINENCE 04-20-12    occ. with nighttime sleep pattern  . SYNCOPE, HX OF   . BENIGN POSITIONAL VERTIGO   . GERD   . DYSLIPIDEMIA   . Raynaud disease   . Complication of anesthesia 04-20-12    some issues with prolonged sedation after anesthesia  . Arthritis 04-20-12    Osteoarthritis-right hip  . HYPERTENSION     severe, pt intol of med tx, Pt. has severe "whitecoat" syndrome  . Osteopenia   . Cataract   . Esophageal stricture   . Diverticulosis   . Hypothyroidism 09/29/2014    New dx 09/2014  . Atrial fibrillation status post cardioversion, 01/30/15 maintaining SR.  01/31/2015    Current Outpatient Prescriptions  Medication Sig Dispense Refill  . acetaminophen (TYLENOL) 325 MG tablet Take 2 tablets (650 mg total) by mouth every 4 (four) hours as needed for headache or mild pain.    Marland Kitchen apixaban (ELIQUIS) 5 MG TABS tablet Take 1 tablet (5 mg total) by mouth 2 (two) times daily. 60 tablet 0  . calcium carbonate (OS-CAL) 600 MG TABS Take 600 mg by mouth daily.      . clindamycin (CLEOCIN) 150 MG capsule Use for dental procedures only    . diazepam (VALIUM) 5 MG tablet Take 1 tablet (5 mg total) by mouth every 12 (twelve) hours as needed for anxiety. for anxiety  40 tablet 0  . estradiol (ESTRACE VAGINAL) 0.1 MG/GM vaginal cream Place 1 Applicatorful vaginally once a week. 1 gram once weekly 42.5 g 1  . famotidine (PEPCID AC) 10 MG tablet Take 1 tablet (10 mg total) by mouth daily.    . fluticasone (FLONASE) 50 MCG/ACT nasal spray Place 1 spray into both nostrils daily as needed for allergies or rhinitis. 16 g 2  . furosemide (LASIX) 40 MG tablet Take 1 tablet (40 mg total) by mouth daily. Till edema resolved, then take once a day as needed for swelling 30 tablet 0  . metoprolol (LOPRESSOR) 50 MG tablet Take 0.5 tablets (25 mg total) by mouth 2 (two) times daily. 60 tablet 11  . miconazole (MICOTIN) 2 % powder Apply topically as needed  for itching. 70 g 0  . Multiple Vitamins-Minerals (CENTRUM SILVER) tablet Take 1 tablet by mouth daily.     . Omega-3 Fatty Acids (FISH OIL) 1200 MG CAPS Take 2 each by mouth daily.     . potassium chloride SA (K-DUR,KLOR-CON) 20 MEQ tablet Take 1 tablet (20 mEq total) by mouth daily. One a day when you take furosemide 30 tablet 11  . amiodarone (PACERONE) 400 MG tablet Take 1 tablet (400 mg total) by mouth 2 (two) times daily. 14 tablet 0  . [START ON 02/14/2015] amiodarone (PACERONE) 400 MG tablet Take 1 tablet (400 mg total) by mouth daily. 30 tablet 6   No current facility-administered medications for this visit.    Allergies:    Allergies  Allergen Reactions  . Levothyroxine     Exhaustion, shortness of breath  . Penicillins Other (See Comments)    dry mouth  . Statins     "Pain, weakness and kidney problems"    Social History:  The patient  reports that she has never smoked. She has never used smokeless tobacco. She reports that she drinks about 1.2 oz of alcohol per week. She reports that she does not use illicit drugs.   Family history:   Family History  Problem Relation Age of Onset  . Adopted: Yes  . Family history unknown: Yes    ROS:  Please see the history of present illness.  All other systems reviewed and negative.   PHYSICAL EXAM: VS:  BP 120/70 mmHg  Pulse 131  Ht 5' 0.5" (1.537 m)  Wt 154 lb 3.2 oz (69.945 kg)  BMI 29.61 kg/m2 Well nourished, well developed, in no acute distress HEENT: Pupils are equal round react to light accommodation extraocular movements are intact.  Neck: no JVDNo cervical lymphadenopathy. Cardiac:Irregular rate and rhythmt murmurs rubs or gallops. Lungs:  clear to auscultation bilaterally, no wheezing, rhonchi or rales Abd: soft, nontender, positive bowel sounds all quadrants, no hepatosplenomegaly Ext: no lower extremity edema.  2+ radial and dorsalis pedis pulses. Skin: warm and dry Neuro:  Grossly normal  EKG:  Atrophic  fibrillation with a rate of 131 bpm.  ASSESSMENT AND PLAN:  Problem List Items Addressed This Visit    Atrial fibrillation status post cardioversion, 01/30/15 maintaining SR.  - Primary   Relevant Medications   amiodarone (PACERONE) 400 MG tablet   amiodarone (PACERONE) 400 MG tablet (Start on 02/14/2015)   Other Relevant Orders   EKG 12-Lead   Magnesium (Completed)   Basic Metabolic Panel (BMET) (Completed)     1. Atrial fibrillation rapid ventricular response She'll be started on amiodarone 400 mg twice daily for a week and then discuss decreased to 400 once daily.  She continues on eliquis metoprolol.  She will follow-up in approximately 2 weeks. She will need monitoring of her thyroid. Another option was to use sotalol however, patient didn't appear to anxious to go back to the hospital for monitoring while the doses were being given.  She doesn't convert spontaneously which set her up for TEE cardioversion about 30 days.   2. Acute on chronic kidney injury This is likely related to taking Lasix and getting dehydrated. She is hypotensive at home in the [id number] systolic. Her weight is down 8 pounds since June 23. Basic metabolic panel shows worsening serum creatinine and elevated BUN. Called her home and asked her to increase her fluid intake for the next couple of days. Her Lasix has been changed to as needed and we have asked her to weigh herself daily.    3. Hypothyroidism Need close monitoring.

## 2015-02-06 NOTE — Patient Instructions (Addendum)
Medication Instructions:  Your physician has recommended you make the following change in your medication:   1. START Amiodarone 400 mg by mouth twice daily for one week, then 400 mg by mouth once daily.  Labwork Your physician recommends that you have labs today. Mg and BMET  Testing/Procedures: NONE  Follow-Up: Your physician recommends that you schedule a follow-up appointment in: 2 weeks with a PA.    Amiodarone tablets What is this medicine? AMIODARONE (a MEE oh da rone) is an antiarrhythmic drug. It helps make your heart beat regularly. Because of the side effects caused by this medicine, it is only used when other medicines have not worked. It is usually used for heartbeat problems that may be life threatening. This medicine may be used for other purposes; ask your health care provider or pharmacist if you have questions. COMMON BRAND NAME(S): Cordarone, Pacerone What should I tell my health care provider before I take this medicine? They need to know if you have any of these conditions: -liver disease -lung disease -other heart problems -thyroid disease -an unusual or allergic reaction to amiodarone, iodine, other medicines, foods, dyes, or preservatives -pregnant or trying to get pregnant -breast-feeding How should I use this medicine? Take this medicine by mouth with a glass of water. Follow the directions on the prescription label. You can take this medicine with or without food. However, you should always take it the same way each time. Take your doses at regular intervals. Do not take your medicine more often than directed. Do not stop taking except on the advice of your doctor or health care professional. A special MedGuide will be given to you by the pharmacist with each prescription and refill. Be sure to read this information carefully each time. Talk to your pediatrician regarding the use of this medicine in children. Special care may be needed. Overdosage: If you  think you have taken too much of this medicine contact a poison control center or emergency room at once. NOTE: This medicine is only for you. Do not share this medicine with others. What if I miss a dose? If you miss a dose, take it as soon as you can. If it is almost time for your next dose, take only that dose. Do not take double or extra doses. What may interact with this medicine? Do not take this medicine with any of the following medications: -abarelix -apomorphine -arsenic trioxide -certain antibiotics like erythromycin, gemifloxacin, levofloxacin, pentamidine -certain medicines for depression like amoxapine, tricyclic antidepressants -certain medicines for fungal infections like fluconazole, itraconazole, ketoconazole, posaconazole, voriconazole -certain medicines for irregular heart beat like disopyramide, dofetilide, dronedarone, ibutilide, propafenone, sotalol -certain medicines for malaria like chloroquine, halofantrine -cisapride -droperidol -haloperidol -hawthorn -maprotiline -methadone -phenothiazines like chlorpromazine, mesoridazine, thioridazine -pimozide -ranolazine -red yeast rice -vardenafil -ziprasidone This medicine may also interact with the following medications: -antiviral medicines for HIV or AIDS -certain medicines for blood pressure, heart disease, irregular heart beat -certain medicines for cholesterol like atorvastatin, cerivastatin, lovastatin, simvastatin -certain medicines for hepatitis C like sofosbuvir and ledipasvir; sofosbuvir -certain medicines for seizures like phenytoin -certain medicines for thyroid problems -certain medicines that treat or prevent blood clots like warfarin -cholestyramine -cimetidine -clopidogrel -cyclosporine -dextromethorphan -diuretics -fentanyl -general anesthetics -grapefruit juice -lidocaine -loratadine -methotrexate -other medicines that prolong the QT interval (cause an abnormal heart  rhythm) -procainamide -quinidine -rifabutin, rifampin, or rifapentine -St. John's Wort -trazodone This list may not describe all possible interactions. Give your health care provider a list of all the medicines,  herbs, non-prescription drugs, or dietary supplements you use. Also tell them if you smoke, drink alcohol, or use illegal drugs. Some items may interact with your medicine. What should I watch for while using this medicine? Your condition will be monitored closely when you first begin therapy. Often, this drug is first started in a hospital or other monitored health care setting. Once you are on maintenance therapy, visit your doctor or health care professional for regular checks on your progress. Because your condition and use of this medicine carry some risk, it is a good idea to carry an identification card, necklace or bracelet with details of your condition, medications, and doctor or health care professional. Dennis Bast may get drowsy or dizzy. Do not drive, use machinery, or do anything that needs mental alertness until you know how this medicine affects you. Do not stand or sit up quickly, especially if you are an older patient. This reduces the risk of dizzy or fainting spells. This medicine can make you more sensitive to the sun. Keep out of the sun. If you cannot avoid being in the sun, wear protective clothing and use sunscreen. Do not use sun lamps or tanning beds/booths. You should have regular eye exams before and during treatment. Call your doctor if you have blurred vision, see halos, or your eyes become sensitive to light. Your eyes may get dry. It may be helpful to use a lubricating eye solution or artificial tears solution. If you are going to have surgery or a procedure that requires contrast dyes, tell your doctor or health care professional that you are taking this medicine. What side effects may I notice from receiving this medicine? Side effects that you should report to your  doctor or health care professional as soon as possible: -allergic reactions like skin rash, itching or hives, swelling of the face, lips, or tongue -blue-gray coloring of the skin -blurred vision, seeing blue green halos, increased sensitivity of the eyes to light -breathing problems -chest pain -dark urine -fast, irregular heartbeat -feeling faint or light-headed -intolerance to heat or cold -nausea or vomiting -pain and swelling of the scrotum -pain, tingling, numbness in feet, hands -redness, blistering, peeling or loosening of the skin, including inside the mouth -spitting up blood -stomach pain -sweating -unusual or uncontrolled movements of body -unusually weak or tired -weight gain or loss -yellowing of the eyes or skin Side effects that usually do not require medical attention (report to your doctor or health care professional if they continue or are bothersome): -change in sex drive or performance -constipation -dizziness -headache -loss of appetite -trouble sleeping This list may not describe all possible side effects. Call your doctor for medical advice about side effects. You may report side effects to FDA at 1-800-FDA-1088. Where should I keep my medicine? Keep out of the reach of children. Store at room temperature between 20 and 25 degrees C (68 and 77 degrees F). Protect from light. Keep container tightly closed. Throw away any unused medicine after the expiration date. NOTE: This sheet is a summary. It may not cover all possible information. If you have questions about this medicine, talk to your doctor, pharmacist, or health care provider.  2015, [employer] Standard. (2013-10-23 19:48:11) :

## 2015-02-08 ENCOUNTER — Ambulatory Visit (INDEPENDENT_AMBULATORY_CARE_PROVIDER_SITE_OTHER): Payer: Medicare Other | Admitting: Internal Medicine

## 2015-02-08 ENCOUNTER — Telehealth: Payer: Self-pay

## 2015-02-08 ENCOUNTER — Encounter: Payer: Self-pay | Admitting: Internal Medicine

## 2015-02-08 ENCOUNTER — Emergency Department (HOSPITAL_COMMUNITY)
Admission: EM | Admit: 2015-02-08 | Discharge: 2015-02-08 | Disposition: A | Discharge status: Home / Self Care | Payer: Medicare Other | Attending: Emergency Medicine | Admitting: Emergency Medicine

## 2015-02-08 ENCOUNTER — Emergency Department (HOSPITAL_COMMUNITY): Payer: Medicare Other

## 2015-02-08 ENCOUNTER — Other Ambulatory Visit (INDEPENDENT_AMBULATORY_CARE_PROVIDER_SITE_OTHER): Payer: Medicare Other

## 2015-02-08 ENCOUNTER — Encounter (HOSPITAL_COMMUNITY): Payer: Self-pay | Admitting: *Deleted

## 2015-02-08 VITALS — BP 128/70 | HR 75 | Temp 97.5°F | Resp 18 | Wt 156.0 lb

## 2015-02-08 DIAGNOSIS — M1990 Unspecified osteoarthritis, unspecified site: Secondary | ICD-10-CM | POA: Diagnosis not present

## 2015-02-08 DIAGNOSIS — N183 Chronic kidney disease, stage 3 (moderate): Secondary | ICD-10-CM | POA: Diagnosis not present

## 2015-02-08 DIAGNOSIS — Z7902 Long term (current) use of antithrombotics/antiplatelets: Secondary | ICD-10-CM | POA: Insufficient documentation

## 2015-02-08 DIAGNOSIS — E860 Dehydration: Secondary | ICD-10-CM | POA: Diagnosis not present

## 2015-02-08 DIAGNOSIS — Z7951 Long term (current) use of inhaled steroids: Secondary | ICD-10-CM | POA: Insufficient documentation

## 2015-02-08 DIAGNOSIS — I4891 Unspecified atrial fibrillation: Secondary | ICD-10-CM | POA: Insufficient documentation

## 2015-02-08 DIAGNOSIS — Z79899 Other long term (current) drug therapy: Secondary | ICD-10-CM | POA: Insufficient documentation

## 2015-02-08 DIAGNOSIS — Z880 Allergy status to penicillin: Secondary | ICD-10-CM | POA: Diagnosis not present

## 2015-02-08 DIAGNOSIS — I129 Hypertensive chronic kidney disease with stage 1 through stage 4 chronic kidney disease, or unspecified chronic kidney disease: Secondary | ICD-10-CM | POA: Insufficient documentation

## 2015-02-08 DIAGNOSIS — M8580 Other specified disorders of bone density and structure, unspecified site: Secondary | ICD-10-CM | POA: Insufficient documentation

## 2015-02-08 DIAGNOSIS — E669 Obesity, unspecified: Secondary | ICD-10-CM | POA: Insufficient documentation

## 2015-02-08 DIAGNOSIS — R002 Palpitations: Secondary | ICD-10-CM

## 2015-02-08 DIAGNOSIS — H269 Unspecified cataract: Secondary | ICD-10-CM | POA: Diagnosis not present

## 2015-02-08 DIAGNOSIS — R7989 Other specified abnormal findings of blood chemistry: Secondary | ICD-10-CM | POA: Diagnosis present

## 2015-02-08 DIAGNOSIS — R55 Syncope and collapse: Secondary | ICD-10-CM | POA: Insufficient documentation

## 2015-02-08 DIAGNOSIS — N179 Acute kidney failure, unspecified: Secondary | ICD-10-CM | POA: Diagnosis not present

## 2015-02-08 DIAGNOSIS — N182 Chronic kidney disease, stage 2 (mild): Secondary | ICD-10-CM | POA: Diagnosis not present

## 2015-02-08 DIAGNOSIS — K219 Gastro-esophageal reflux disease without esophagitis: Secondary | ICD-10-CM | POA: Insufficient documentation

## 2015-02-08 DIAGNOSIS — I517 Cardiomegaly: Secondary | ICD-10-CM | POA: Diagnosis not present

## 2015-02-08 DIAGNOSIS — R0600 Dyspnea, unspecified: Secondary | ICD-10-CM | POA: Diagnosis not present

## 2015-02-08 LAB — CBC
HCT: 46.5 % — ABNORMAL HIGH (ref 36.0–46.0)
Hemoglobin: 15.1 g/dL — ABNORMAL HIGH (ref 12.0–15.0)
MCH: 30.9 pg (ref 26.0–34.0)
MCHC: 32.5 g/dL (ref 30.0–36.0)
MCV: 95.1 fL (ref 78.0–100.0)
Platelets: 242 10*3/uL (ref 150–400)
RBC: 4.89 MIL/uL (ref 3.87–5.11)
RDW: 16 % — ABNORMAL HIGH (ref 11.5–15.5)
WBC: 7.8 10*3/uL (ref 4.0–10.5)

## 2015-02-08 LAB — BASIC METABOLIC PANEL
Anion gap: 9 (ref 5–15)
BUN: 28 mg/dL — ABNORMAL HIGH (ref 6–23)
BUN: 29 mg/dL — ABNORMAL HIGH (ref 6–20)
CO2: 28 mEq/L (ref 19–32)
CO2: 23 mmol/L (ref 22–32)
Calcium: 9.5 mg/dL (ref 8.4–10.5)
Calcium: 9.1 mg/dL (ref 8.9–10.3)
Chloride: 102 mEq/L (ref 96–112)
Chloride: 103 mmol/L (ref 101–111)
Creatinine, Ser: 1.75 mg/dL — ABNORMAL HIGH (ref 0.40–1.20)
Creatinine, Ser: 1.77 mg/dL — ABNORMAL HIGH (ref 0.44–1.00)
GFR calc Af Amer: 32 mL/min — ABNORMAL LOW (ref >60)
GFR calc non Af Amer: 28 mL/min — ABNORMAL LOW (ref >60)
GFR: 30.47 mL/min — ABNORMAL LOW (ref >60.00)
Glucose, Bld: 106 mg/dL — ABNORMAL HIGH (ref 70–99)
Glucose, Bld: 131 mg/dL — ABNORMAL HIGH (ref 65–99)
Potassium: 4.5 mEq/L (ref 3.5–5.1)
Potassium: 4.7 mmol/L (ref 3.5–5.1)
Sodium: 136 mEq/L (ref 135–145)
Sodium: 135 mmol/L (ref 135–145)

## 2015-02-08 LAB — BRAIN NATRIURETIC PEPTIDE
B Natriuretic Peptide: 488.4 pg/mL — ABNORMAL HIGH (ref 0.0–100.0)
Pro B Natriuretic peptide (BNP): 480 pg/mL — ABNORMAL HIGH (ref 0.0–100.0)

## 2015-02-08 LAB — I-STAT TROPONIN, ED: Troponin i, poc: 0.01 ng/mL (ref 0.00–0.08)

## 2015-02-08 MED ORDER — SODIUM CHLORIDE 0.9 % IV BOLUS (SEPSIS)
500.0000 mL | Freq: Once | INTRAVENOUS | Status: AC
  Administered 2015-02-08: 500 mL via INTRAVENOUS

## 2015-02-08 NOTE — Patient Instructions (Addendum)
We have done the EKG of you today and you are in normal rhythm today. This does not mean that you are not coming in and out of atrial fibrillation.   We need to check the blood work for the kidney function because it is worsening overall and if it is still worsening we may need you to go to the hospital for IV hydration.   We are also checking the blood work to gauge the fluid levels but would like you to STOP lasix until you hear back from [place].   We will get you the cardiology appointment and call you with that.

## 2015-02-08 NOTE — Telephone Encounter (Signed)
i called cardiology per dr Jeraldine Loots request and scheduled appt for patient to see chris berge in cardiology on July 22nd at 1400--patient advised and repeated back for understanding

## 2015-02-08 NOTE — Assessment & Plan Note (Signed)
Rate at goal today and in sinus. She is still taking the eliquis and metoprolol and amiodarone. Advised her to not take the lasix until further instructions as it could be worsening her kidney function.

## 2015-02-08 NOTE — Progress Notes (Signed)
   Subjective:    Patient ID: Tiffany [name], female    DOB: July 23, 1946, 69 y.o.   MRN: [id number]  HPI The patient is a 69 YO female coming in for follow up of hospitalization (hospitalized with A fib with RVR, started on anticoagulation and beta blocker and cardioverted). She was seen in follow up with cardiology 2 days ago and was back in A fib with RVR but declined going back to the hospital. She is now feeling very tired and poorly. She is having pre-syncopal episodes twice today. She is not taking lasix the last 2 days. Has been on amiodarone for the last 2 days since visit with cardiology. Does not feel like she is urinating well at all.   Review of Systems  Constitutional: Positive for activity change and fatigue. Negative for chills, appetite change and unexpected weight change.  Respiratory: Positive for shortness of breath. Negative for cough, chest tightness and wheezing.   Cardiovascular: Positive for palpitations. Negative for chest pain and leg swelling.  Gastrointestinal: Negative for abdominal pain, constipation and abdominal distention.  Endocrine: Negative for cold intolerance and heat intolerance.  Musculoskeletal: Negative.   Neurological: Positive for dizziness, weakness and light-headedness. Negative for seizures, syncope, speech difficulty and headaches.      Objective:   Physical Exam  Constitutional: She is oriented to person, place, and time. She appears well-developed and well-nourished.  HENT:  Head: Normocephalic and atraumatic.  Eyes: EOM are normal.  Neck: Normal range of motion.  Cardiovascular: Normal rate and regular rhythm.   Appears regular  Pulmonary/Chest: Effort normal and breath sounds normal.  Abdominal: Soft. She exhibits no distension. There is no tenderness. There is no rebound.  Musculoskeletal: She exhibits no edema.  Neurological: She is alert and oriented to person, place, and time. Coordination normal.  Skin: Skin is warm and dry.    Psychiatric: She has a normal mood and affect.   Filed Vitals:   02/08/15 1427  BP: 128/70  Pulse: 75  Temp: 97.5 F (36.4 C)  TempSrc: Oral  Resp: 18  Weight: 156 lb (70.761 kg)  SpO2: 100%    EKG: Rate 62, sinus rhytmn, old anterior ischemia unchanged, no new st or t wave changes, qt 420.     Assessment & Plan:

## 2015-02-08 NOTE — ED Notes (Signed)
Pt normally has high BP and states she didn't took her BP medication at home today, pt encouraged to take her medication when going home.

## 2015-02-08 NOTE — Assessment & Plan Note (Signed)
BP okay today on exam but symptoms of pre-syncope several times today. EKG in sinus and no acute ST changes. Given her lab results have advised her to seek care at ER for management of her kidneys and fluid status.

## 2015-02-08 NOTE — ED Provider Notes (Signed)
CSN: [id number]     Arrival date & time 02/08/15  1735 History   First MD Initiated Contact with Patient 02/08/15 2006     Chief Complaint  Patient presents with  . Abnormal Lab  . Loss of Consciousness     (Consider location/radiation/quality/duration/timing/severity/associated sxs/prior Treatment) HPI Comments: Patient reports that she was out shopping today when she developed a near syncopal episode.  She felt woozy.  She went to lay down and hurt vehicle for a short period of time, felt better proceeded to shop and have lunch.  She states that when she got she again felt woozy laid on her bed and had a pounding heartbeat and I "thick in her chest" and then immediately felt better.  He was then seen by her cardiologist, who drew labs.  She was called.  Several hours later to come to the emergency department for worsening renal failure. Patient denies any chest pain, shortness of breath, peripheral edema.  She does report that since she stopped taking Lasix 2 days ago.  She's had decreased urinary output.  She's had no nausea, vomiting, diarrhea, febrile illness. He had a recent hospitalization for atrial fibrillation with cardioversion and has been placed on antiarrhythmics, proximal, which she has been taking on a regular basis.  The history is provided by the patient.    Past Medical History  Diagnosis Date  . Obesity, unspecified   . URINARY INCONTINENCE 04-20-12    occ. with nighttime sleep pattern  . SYNCOPE, HX OF   . BENIGN POSITIONAL VERTIGO   . GERD   . DYSLIPIDEMIA   . Raynaud disease   . Complication of anesthesia 04-20-12    some issues with prolonged sedation after anesthesia  . Arthritis 04-20-12    Osteoarthritis-right hip  . HYPERTENSION     severe, pt intol of med tx, Pt. has severe "whitecoat" syndrome  . Osteopenia   . Cataract   . Esophageal stricture   . Diverticulosis   . Hypothyroidism 09/29/2014    New dx 09/2014  . Atrial fibrillation status post  cardioversion, 01/30/15 maintaining SR.  01/31/2015   Past Surgical History  Procedure Laterality Date  . Nasal fracture surgery  2006  . Tubal ligation  1980  . Cholecystectomy      '90-"sludge"  . Total hip arthroplasty  04/26/2012    Procedure: TOTAL HIP ARTHROPLASTY ANTERIOR APPROACH;  Surgeon: Mauri Pole, MD;  Location: [facility];  Service: Orthopedics;  Laterality: Right;  . Breast ultrasound Right 09/13/13    There is no sonographic evidence of malignancy. the [id number] complicated cyst in (R) breast is consistent with a benign finding. repeat in 1 year  . Tee without cardioversion N/A 01/30/2015    Procedure: TRANSESOPHAGEAL ECHOCARDIOGRAM (TEE);  Surgeon: Sanda Klein, MD;  Location: [facility];  Service: Cardiovascular;  Laterality: N/A;  . Cardioversion N/A 01/30/2015    Procedure: CARDIOVERSION;  Surgeon: Sanda Klein, MD;  Location: [facility];  Service: Cardiovascular;  Laterality: N/A;   Family History  Problem Relation Age of Onset  . Adopted: Yes  . Family history unknown: Yes   History  Substance Use Topics  . Smoking status: Never Smoker   . Smokeless tobacco: Never Used  . Alcohol Use: 1.2 oz/week    2 Glasses of wine per week     Comment: several glasses wine weekly   OB History    No data available     Review of Systems  Constitutional: Negative for fever.  Respiratory:  Negative for cough and shortness of breath.   Cardiovascular: Positive for palpitations. Negative for chest pain and leg swelling.  All other systems reviewed and are negative.     Allergies  Levothyroxine; Statins; and Penicillins  Home Medications   Prior to Admission medications   Medication Sig Start Date End Date Taking? Authorizing Provider  amiodarone (PACERONE) 400 MG tablet Take 1 tablet (400 mg total) by mouth 2 (two) times daily. 02/06/15 02/13/15 Yes Brett Canales, PA-C  apixaban (ELIQUIS) 5 MG TABS tablet Take 1 tablet (5 mg total) by mouth 2 (two) times daily. 01/31/15   Yes Isaiah Serge, NP  calcium carbonate (OS-CAL) 600 MG TABS Take 600 mg by mouth daily.     Yes Historical Provider, MD  clindamycin (CLEOCIN) 150 MG capsule Use for dental procedures only 03/19/14  Yes Historical Provider, MD  diazepam (VALIUM) 5 MG tablet Take 1 tablet (5 mg total) by mouth every 12 (twelve) hours as needed for anxiety. for anxiety 11/22/14  Yes Rowe Clack, MD  estradiol (ESTRACE VAGINAL) 0.1 MG/GM vaginal cream Place 1 Applicatorful vaginally once a week. 1 gram once weekly Patient taking differently: Place 1 Applicatorful vaginally once a week. 1 gram once weekly on Sat or sun 01/29/15  Yes Luke K Kilroy, PA-C  famotidine (PEPCID AC) 10 MG tablet Take 1 tablet (10 mg total) by mouth daily. 01/29/15  Yes Luke K Kilroy, PA-C  metoprolol (LOPRESSOR) 50 MG tablet Take 0.5 tablets (25 mg total) by mouth 2 (two) times daily. 01/31/15  Yes Isaiah Serge, NP  miconazole (MICOTIN) 2 % powder Apply topically as needed for itching. 09/26/14  Yes Rowe Clack, MD  Multiple Vitamins-Minerals (CENTRUM SILVER) tablet Take 1 tablet by mouth daily.    Yes Historical Provider, MD  Omega 3 1000 MG CAPS Take 2,000 mg by mouth daily.   Yes Historical Provider, MD  acetaminophen (TYLENOL) 325 MG tablet Take 2 tablets (650 mg total) by mouth every 4 (four) hours as needed for headache or mild pain. 01/29/15   Erlene Quan, PA-C  amiodarone (PACERONE) 400 MG tablet Take 1 tablet (400 mg total) by mouth daily. 02/14/15   Brett Canales, PA-C  fluticasone (FLONASE) 50 MCG/ACT nasal spray Place 1 spray into both nostrils daily as needed for allergies or rhinitis. 01/29/15   Erlene Quan, PA-C  furosemide (LASIX) 40 MG tablet Take 1 tablet (40 mg total) by mouth daily. Till edema resolved, then take once a day as needed for swelling 01/29/15   Erlene Quan, PA-C  potassium chloride SA (K-DUR,KLOR-CON) 20 MEQ tablet Take 1 tablet (20 mEq total) by mouth daily. One a day when you take furosemide  01/30/15   Luke K Kilroy, PA-C   BP 151/95 mmHg  Pulse 71  Temp(Src) 98 F (36.7 C) (Oral)  Resp 17  Ht 5\' 1"  (1.549 m)  Wt 156 lb 4.8 oz (70.897 kg)  BMI 29.55 kg/m2  SpO2 96% Physical Exam  Constitutional: She is oriented to person, place, and time. She appears well-developed and well-nourished.  HENT:  Head: Normocephalic.  Eyes: Pupils are equal, round, and reactive to light.  Neck: Normal range of motion.  Cardiovascular: Normal rate and regular rhythm.   Pulmonary/Chest: Effort normal and breath sounds normal. No respiratory distress. She has no wheezes. She has no rales.  Musculoskeletal: She exhibits no edema or tenderness.  Neurological: She is alert and oriented to person, place, and time.  Skin:  Skin is warm. No erythema.  Nursing note and vitals reviewed.   ED Course  Procedures (including critical care time) Labs Review Labs Reviewed  CBC - Abnormal; Notable for the following:    Hemoglobin 15.1 (*)    HCT 46.5 (*)    RDW 16.0 (*)    All other components within normal limits  BASIC METABOLIC PANEL - Abnormal; Notable for the following:    Glucose, Bld 131 (*)    BUN 29 (*)    Creatinine, Ser 1.77 (*)    GFR calc non Af Amer 28 (*)    GFR calc Af Amer 32 (*)    All other components within normal limits  BRAIN NATRIURETIC PEPTIDE - Abnormal; Notable for the following:    B Natriuretic Peptide 488.4 (*)    All other components within normal limits  I-STAT TROPOININ, ED    Imaging Review Dg Chest 2 View  02/08/2015   CLINICAL DATA:  Dizziness.  Atrial fibrillation.  Syncope.  EXAM: CHEST  2 VIEW  COMPARISON:  01/28/2015  FINDINGS: Mild cardiomegaly is stable. Both lungs are clear. No evidence pleural effusion. Tiny right midlung calcified granuloma again noted.  IMPRESSION: Stable mild cardiomegaly.  No active lung disease.   Electronically Signed   By: Earle Gell M.D.   On: 02/08/2015 18:35     EKG Interpretation   Date/Time:  Friday February 08 2015  17:47:47 EDT Ventricular Rate:  65 PR Interval:  164 QRS Duration: 76 QT Interval:  414 QTC Calculation: 430 R Axis:   -80 Text Interpretation:  Normal sinus rhythm Left axis deviation Possible  Anterior infarct , age undetermined Abnormal ECG No significant change  since last tracing Confirmed by [place] [phone]) on 02/08/2015  8:34:21 PM     Slightly worsening BUN and creatinine over 3 months.  She is in normal sinus rhythm.  She is asymptomatic at this time.  She will be given 500 cc IV bolus. Patient has been instructed to follow-up with her primary care provider in 3-5 days for  Recheck of her kidney function MDM   Final diagnoses:  Chronic renal insufficiency, stage II (mild)  Intermittent palpitations        Junius Creamer, NP 02/08/15 [id number]  Ernestina Patches, MD 02/09/15 [id number]

## 2015-02-08 NOTE — Assessment & Plan Note (Signed)
Her kidneys are elevated from Cr 1.2 to Cr 1.6 (2 days ago) she has since stopped the lasix and will recheck the kidney function today. To determine measure of fluid will recheck the BNP as she feels some fluid on board and clinically not too evident. If Creatinine not improved or worse will advise her to seek care in the ER. Addendum Cr resulted at 1.75 with further decline in GFR from 49 (2 weeks ago) to 30 today. Will advise her to go to the ER.

## 2015-02-08 NOTE — ED Notes (Signed)
Pt reports recent diagnosis of afib. Was NSR today at pcp. Pt reports having syncopal episode today while at home and had "a cardiac event" which she describes as palpitations. Pt went to dr office and had labs drawn, sent here due to elevated renal function and BNP. Reports recent swelling to legs. Denies CP or SOB. ekg done at triage.

## 2015-02-08 NOTE — Progress Notes (Signed)
Pre visit review using our clinic review tool, if applicable. No additional management support is needed unless otherwise documented below in the visit note. 

## 2015-02-08 NOTE — Discharge Instructions (Signed)
Atrial Fibrillation Atrial fibrillation is a condition that causes your heart to beat irregularly. It may also cause your heart to beat faster than normal. Atrial fibrillation can prevent your heart from pumping blood normally. It increases your risk of stroke and heart problems. HOME CARE  Take medications as told by your doctor.  Only take medications that your doctor says are safe. Some medications can make the condition worse or happen again.  If blood thinners were prescribed by your doctor, take them exactly as told. Too much can cause bleeding. Too little and you will not have the needed protection against stroke and other problems.  Perform blood tests at home if told by your doctor.  Perform blood tests exactly as told by your doctor.  Do not drink alcohol.  Do not drink beverages with caffeine such as coffee, soda, and some teas.  Maintain a healthy weight.  Do not use diet pills unless your doctor says they are safe. They may make heart problems worse.  Follow diet instructions as told by your doctor.  Exercise regularly as told by your doctor.  Keep all follow-up appointments. GET HELP IF:  You notice a change in the speed, rhythm, or strength of your heartbeat.  You suddenly begin peeing (urinating) more often.  You get tired more easily when moving or exercising. GET HELP RIGHT AWAY IF:   You have chest or belly (abdominal) pain.  You feel sick to your stomach (nauseous).  You are short of breath.  You suddenly have swollen feet and ankles.  You feel dizzy.  You face, arms, or legs feel numb or weak.  There is a change in your vision or speech. MAKE SURE YOU:   Understand these instructions.  Will watch your condition.  Will get help right away if you are not doing well or get worse. Document Released: 04/28/2008 Document Revised: 12/04/2013 Document Reviewed: 08/30/2012 [facility] Patient Information 2015 [place], [place]. This information is not  intended to replace advice given to you by your health care provider. Make sure you discuss any questions you have with your health care provider.  Chronic Kidney Disease Chronic kidney disease occurs when the kidneys are damaged over a long period. The kidneys are two organs that lie on either side of the spine between the middle of the back and the front of the abdomen. The kidneys:   Remove wastes and extra water from the blood.   Produce important hormones. These help keep bones strong, regulate blood pressure, and help create red blood cells.   Balance the fluids and chemicals in the blood and tissues. A small amount of kidney damage may not cause problems, but a large amount of damage may make it difficult or impossible for the kidneys to work the way they should. If steps are not taken to slow down the kidney damage or stop it from getting worse, the kidneys may stop working permanently. Most of the time, chronic kidney disease does not go away. However, it can often be controlled, and those with the disease can usually live normal lives. CAUSES  The most common causes of chronic kidney disease are diabetes and high blood pressure (hypertension). Chronic kidney disease may also be caused by:   Diseases that cause the kidneys' filters to become inflamed.   Diseases that affect the immune system.   Genetic diseases.   Medicines that damage the kidneys, such as anti-inflammatory medicines.  Poisoning or exposure to toxic substances.   A reoccurring kidney or  urinary infection.   A problem with urine flow. This may be caused by:   Cancer.   Kidney stones.   An enlarged prostate in males. SIGNS AND SYMPTOMS  Because the kidney damage in chronic kidney disease occurs slowly, symptoms develop slowly and may not be obvious until the kidney damage becomes severe. A person may have a kidney disease for years without showing any symptoms. Symptoms can include:   Swelling  (edema) of the legs, ankles, or feet.   Tiredness (lethargy).   Nausea or vomiting.   Confusion.   Problems with urination, such as:   Decreased urine production.   Frequent urination, especially at night.   Frequent accidents in children who are potty trained.   Muscle twitches and cramps.   Shortness of breath.  Weakness.   Persistent itchiness.   Loss of appetite.  Metallic taste in the mouth.  Trouble sleeping.  Slowed development in children.  Short stature in children. DIAGNOSIS  Chronic kidney disease may be detected and diagnosed by tests, including blood, urine, imaging, or kidney biopsy tests.  TREATMENT  Most chronic kidney diseases cannot be cured. Treatment usually involves relieving symptoms and preventing or slowing the progression of the disease. Treatment may include:   A special diet. You may need to avoid alcohol and foods thatare salty and high in potassium.   Medicines. These may:   Lower blood pressure.   Relieve anemia.   Relieve swelling.   Protect the bones. HOME CARE INSTRUCTIONS   Follow your prescribed diet.   Take medicines only as directed by your health care provider. Do not take any new medicines (prescription, over-the-counter, or nutritional supplements) unless approved by your health care provider. Many medicines can worsen your kidney damage or need to have the dose adjusted.   Quit smoking if you smoke. Talk to your health care provider about a smoking cessation program.   Keep all follow-up visits as directed by your health care provider. SEEK IMMEDIATE MEDICAL CARE IF:  Your symptoms get worse or you develop new symptoms.   You develop symptoms of end-stage kidney disease. These include:   Headaches.   Abnormally dark or light skin.   Numbness in the hands or feet.   Easy bruising.   Frequent hiccups.   Menstruation stops.   You have a fever.   You have decreased urine  production.   You havepain or bleeding when urinating. MAKE SURE YOU:  Understand these instructions.  Will watch your condition.  Will get help right away if you are not doing well or get worse. FOR MORE INFORMATION   American Association of Kidney Patients: BombTimer.gl  National Kidney Foundation: www.kidney.[employer]: https://mathis.com/  [facility]: www.lifeoptions.org and www.kidneyschool.org Document Released: 04/28/2008 Document Revised: 12/04/2013 Document Reviewed: 03/18/2012 [facility] Patient Information 2015 [place], [place]. This information is not intended to replace advice given to you by your health care provider. Make sure you discuss any questions you have with your health care provider. You need to have your kidney funtions test repeated in 3-5 days Please call your PCP on Monday for an appointment

## 2015-02-12 ENCOUNTER — Other Ambulatory Visit: Payer: Self-pay | Admitting: Physician Assistant

## 2015-02-13 ENCOUNTER — Ambulatory Visit (INDEPENDENT_AMBULATORY_CARE_PROVIDER_SITE_OTHER): Payer: Medicare Other | Admitting: Internal Medicine

## 2015-02-13 ENCOUNTER — Other Ambulatory Visit (INDEPENDENT_AMBULATORY_CARE_PROVIDER_SITE_OTHER): Payer: Medicare Other

## 2015-02-13 ENCOUNTER — Encounter: Payer: Self-pay | Admitting: Internal Medicine

## 2015-02-13 VITALS — BP 126/88 | HR 57 | Temp 98.1°F | Resp 16 | Ht 61.0 in | Wt 160.5 lb

## 2015-02-13 DIAGNOSIS — E038 Other specified hypothyroidism: Secondary | ICD-10-CM | POA: Diagnosis not present

## 2015-02-13 DIAGNOSIS — R739 Hyperglycemia, unspecified: Secondary | ICD-10-CM

## 2015-02-13 DIAGNOSIS — I4891 Unspecified atrial fibrillation: Secondary | ICD-10-CM | POA: Diagnosis not present

## 2015-02-13 DIAGNOSIS — N179 Acute kidney failure, unspecified: Secondary | ICD-10-CM

## 2015-02-13 LAB — HEMOGLOBIN A1C: Hgb A1c MFr Bld: 5.3 % (ref 4.6–6.5)

## 2015-02-13 LAB — BASIC METABOLIC PANEL
BUN: 26 mg/dL — ABNORMAL HIGH (ref 6–23)
CO2: 27 mEq/L (ref 19–32)
Calcium: 9.2 mg/dL (ref 8.4–10.5)
Chloride: 107 mEq/L (ref 96–112)
Creatinine, Ser: 1.55 mg/dL — ABNORMAL HIGH (ref 0.40–1.20)
GFR: 35.05 mL/min — ABNORMAL LOW (ref >60.00)
Glucose, Bld: 80 mg/dL (ref 70–99)
Potassium: 4.4 mEq/L (ref 3.5–5.1)
Sodium: 140 mEq/L (ref 135–145)

## 2015-02-13 LAB — URINALYSIS, ROUTINE W REFLEX MICROSCOPIC
Bilirubin Urine: NEGATIVE
Ketones, ur: NEGATIVE
Leukocytes, UA: NEGATIVE
Nitrite: NEGATIVE
Specific Gravity, Urine: <=1.005 — AB (ref 1.000–1.030)
Total Protein, Urine: NEGATIVE
Urine Glucose: NEGATIVE
Urobilinogen, UA: 0.2 (ref 0.0–1.0)
pH: 6 (ref 5.0–8.0)

## 2015-02-13 LAB — T4: T4, Total: 7.4 ug/dL (ref 4.5–12.0)

## 2015-02-13 LAB — CBC WITH DIFFERENTIAL/PLATELET
Basophils Absolute: 0 10*3/uL (ref 0.0–0.1)
Basophils Relative: 0.6 % (ref 0.0–3.0)
Eosinophils Absolute: 0.1 10*3/uL (ref 0.0–0.7)
Eosinophils Relative: 1.8 % (ref 0.0–5.0)
HCT: 42.5 % (ref 36.0–46.0)
Hemoglobin: 14.2 g/dL (ref 12.0–15.0)
Lymphocytes Relative: 21.5 % (ref 12.0–46.0)
Lymphs Abs: 1.3 10*3/uL (ref 0.7–4.0)
MCHC: 33.5 g/dL (ref 30.0–36.0)
MCV: 92.5 fl (ref 78.0–100.0)
Monocytes Absolute: 0.3 10*3/uL (ref 0.1–1.0)
Monocytes Relative: 5.4 % (ref 3.0–12.0)
Neutro Abs: 4.2 10*3/uL (ref 1.4–7.7)
Neutrophils Relative %: 70.7 % (ref 43.0–77.0)
Platelets: 198 10*3/uL (ref 150.0–400.0)
RBC: 4.59 Mil/uL (ref 3.87–5.11)
RDW: 16.5 % — ABNORMAL HIGH (ref 11.5–15.5)
WBC: 6 10*3/uL (ref 4.0–10.5)

## 2015-02-13 LAB — T3, FREE: T3, Free: 2.7 pg/mL (ref 2.3–4.2)

## 2015-02-13 NOTE — Progress Notes (Signed)
Pre visit review using our clinic review tool, if applicable. No additional management support is needed unless otherwise documented below in the visit note. 

## 2015-02-13 NOTE — Patient Instructions (Signed)

## 2015-02-13 NOTE — Progress Notes (Signed)
Subjective:  Patient ID: Tiffany [name], female    DOB: July 12, 1946  Age: 69 y.o. MRN: [id number]  CC: Hypothyroidism and Hypertension   HPI Tiffany [name] presents for for follow-up after several hospitalizations recently for new onset atrial fibrillation as well as diuretic induced dehydration. She received IV fluids last week and had her Lasix dose adjusted and today she feels well and offers no complaints. She has not recently had any palpitations and denies any chest pain or shortness of breath. She states she has a history of hypothyroidism and said she wants to have an antibody level done to see if she is ever had Hashimoto's thyroiditis.  Outpatient Prescriptions Prior to Visit  Medication Sig Dispense Refill  . acetaminophen (TYLENOL) 325 MG tablet Take 2 tablets (650 mg total) by mouth every 4 (four) hours as needed for headache or mild pain.    Marland Kitchen apixaban (ELIQUIS) 5 MG TABS tablet Take 1 tablet (5 mg total) by mouth 2 (two) times daily. 60 tablet 0  . calcium carbonate (OS-CAL) 600 MG TABS Take 600 mg by mouth daily.      . clindamycin (CLEOCIN) 150 MG capsule Use for dental procedures only    . diazepam (VALIUM) 5 MG tablet Take 1 tablet (5 mg total) by mouth every 12 (twelve) hours as needed for anxiety. for anxiety 40 tablet 0  . estradiol (ESTRACE VAGINAL) 0.1 MG/GM vaginal cream Place 1 Applicatorful vaginally once a week. 1 gram once weekly (Patient taking differently: Place 1 Applicatorful vaginally once a week. 1 gram once weekly on Sat or sun) 42.5 g 1  . famotidine (PEPCID AC) 10 MG tablet Take 1 tablet (10 mg total) by mouth daily.    . fluticasone (FLONASE) 50 MCG/ACT nasal spray Place 1 spray into both nostrils daily as needed for allergies or rhinitis. 16 g 2  . metoprolol (LOPRESSOR) 50 MG tablet Take 0.5 tablets (25 mg total) by mouth 2 (two) times daily. 60 tablet 11  . miconazole (MICOTIN) 2 % powder Apply topically as needed for itching. 70 g 0  . Multiple  Vitamins-Minerals (CENTRUM SILVER) tablet Take 1 tablet by mouth daily.     . Omega 3 1000 MG CAPS Take 2,000 mg by mouth daily.    Marland Kitchen amiodarone (PACERONE) 400 MG tablet Take 1 tablet (400 mg total) by mouth daily. 30 tablet 6  . furosemide (LASIX) 40 MG tablet Take 1 tablet (40 mg total) by mouth daily. Till edema resolved, then take once a day as needed for swelling (Patient not taking: Reported on 02/13/2015) 30 tablet 0  . potassium chloride SA (K-DUR,KLOR-CON) 20 MEQ tablet Take 1 tablet (20 mEq total) by mouth daily. One a day when you take furosemide (Patient not taking: Reported on 02/13/2015) 30 tablet 11  . amiodarone (PACERONE) 400 MG tablet TAKE 1 TABLET (400 MG TOTAL) BY MOUTH 2 (TWO) TIMES DAILY. (Patient not taking: Reported on 02/13/2015) 14 tablet 0   No facility-administered medications prior to visit.    ROS Review of Systems  Constitutional: Negative.  Negative for fever, chills, diaphoresis, appetite change and fatigue.  HENT: Negative.   Eyes: Negative.   Respiratory: Negative.  Negative for cough, choking, chest tightness, shortness of breath and stridor.   Cardiovascular: Negative.  Negative for chest pain, palpitations and leg swelling.  Gastrointestinal: Negative.  Negative for nausea, vomiting, abdominal pain, diarrhea, constipation and blood in stool.  Endocrine: Negative.   Genitourinary: Negative.   Musculoskeletal: Negative.  Skin: Negative.   Allergic/Immunologic: Negative.   Neurological: Negative.  Negative for dizziness, tremors, weakness and light-headedness.  Hematological: Negative.  Negative for adenopathy. Does not bruise/bleed easily.  Psychiatric/Behavioral: Negative.     Objective:  BP 126/88 mmHg  Pulse 57  Temp(Src) 98.1 F (36.7 C) (Oral)  Resp 16  Ht 5\' 1"  (1.549 m)  Wt 160 lb 8 oz (72.802 kg)  BMI 30.34 kg/m2  SpO2 96%  BP Readings from Last 3 Encounters:  02/13/15 126/88  02/08/15 151/95  02/08/15 128/70    Wt Readings from  Last 3 Encounters:  02/13/15 160 lb 8 oz (72.802 kg)  02/08/15 156 lb 4.8 oz (70.897 kg)  02/08/15 156 lb (70.761 kg)    Physical Exam  Constitutional: She is oriented to person, place, and time. No distress.  HENT:  Mouth/Throat: Oropharynx is clear and moist. No oropharyngeal exudate.  Eyes: Conjunctivae are normal. Right eye exhibits no discharge. Left eye exhibits no discharge. No scleral icterus.  Neck: Normal range of motion. Neck supple. No JVD present. No tracheal deviation present. No thyromegaly present.  Cardiovascular: Normal rate, regular rhythm, normal heart sounds and intact distal pulses.  Exam reveals no gallop and no friction rub.   No murmur heard. Pulmonary/Chest: Effort normal and breath sounds normal. No stridor. No respiratory distress. She has no wheezes. She has no rales. She exhibits no tenderness.  Abdominal: Soft. Bowel sounds are normal. She exhibits no distension and no mass. There is no tenderness. There is no rebound and no guarding.  Musculoskeletal: Normal range of motion. She exhibits no edema or tenderness.  Lymphadenopathy:    She has no cervical adenopathy.  Neurological: She is oriented to person, place, and time.  Skin: Skin is warm and dry. No rash noted. She is not diaphoretic. No erythema. No pallor.  Psychiatric: She has a normal mood and affect. Her behavior is normal. Judgment and thought content normal.  Vitals reviewed.   Lab Results  Component Value Date   WBC 6.0 02/13/2015   HGB 14.2 02/13/2015   HCT 42.5 02/13/2015   PLT 198.0 02/13/2015   GLUCOSE 80 02/13/2015   CHOL 237* 09/28/2014   TRIG 150.0* 09/28/2014   HDL 59.00 09/28/2014   LDLCALC 148* 09/28/2014   ALT 58* 01/25/2015   AST 47* 01/25/2015   NA 140 02/13/2015   K 4.4 02/13/2015   CL 107 02/13/2015   CREATININE 1.55* 02/13/2015   BUN 26* 02/13/2015   CO2 27 02/13/2015   TSH 1.657 01/28/2015   INR 0.98 04/20/2012   HGBA1C 5.3 02/13/2015    Dg Chest 2  View  02/08/2015   CLINICAL DATA:  Dizziness.  Atrial fibrillation.  Syncope.  EXAM: CHEST  2 VIEW  COMPARISON:  01/28/2015  FINDINGS: Mild cardiomegaly is stable. Both lungs are clear. No evidence pleural effusion. Tiny right midlung calcified granuloma again noted.  IMPRESSION: Stable mild cardiomegaly.  No active lung disease.   Electronically Signed   By: Earle Gell M.D.   On: 02/08/2015 18:35    Assessment & Plan:   Addine was seen today for hypothyroidism and hypertension.  Diagnoses and all orders for this visit:  Other specified hypothyroidism- recent TSH was normal. Today will recheck her thyroid function tests and screen her for autoimmune thyroid disease. Orders: -     T4; Future -     T3, free; Future -     CBC with Differential/Platelet; Future -     Thyroid peroxidase antibody;  Future -     Thyroxine binding globulin; Future  AKI (acute kidney injury)- she continues to be slightly prerenal but her renal function is stable. Will continue her current diuretic therapy and monitor her renal function. Orders: -     Basic metabolic panel; Future -     CBC with Differential/Platelet; Future -     Basic metabolic panel; Future -     Urinalysis, Routine w reflex microscopic (not at [facility]); Future  Hyperglycemia Orders: -     Basic metabolic panel; Future -     Hemoglobin A1c; Future -     Basic metabolic panel; Future -     Hemoglobin A1c; Future   I am having Ms. Fairburn maintain her calcium carbonate, CENTRUM SILVER, clindamycin, miconazole, diazepam, estradiol, famotidine, fluticasone, potassium chloride SA, furosemide, acetaminophen, metoprolol, apixaban, Omega 3, and amiodarone.  Meds ordered this encounter  Medications  . amiodarone (PACERONE) 400 MG tablet    Sig: Take 1 tablet (400 mg total) by mouth daily.    Dispense:  30 tablet    Refill:  6     Follow-up: Return in about 3 months (around 05/16/2015).  Scarlette Calico, MD

## 2015-02-14 LAB — THYROID PEROXIDASE ANTIBODY: Thyroperoxidase Ab SerPl-aCnc: <1 IU/mL (ref <9)

## 2015-02-14 MED ORDER — AMIODARONE HCL 400 MG PO TABS: 400.0000 mg | ORAL_TABLET | Freq: Every day | ORAL | Status: DC

## 2015-02-14 NOTE — Assessment & Plan Note (Signed)
She has good rate and rhythm control today 

## 2015-02-15 ENCOUNTER — Telehealth: Payer: Self-pay | Admitting: Internal Medicine

## 2015-02-15 NOTE — Telephone Encounter (Signed)
Results have not been released.

## 2015-02-15 NOTE — Telephone Encounter (Signed)
Pt called in and would like to talk to nurse about her lab results on 7/13

## 2015-02-16 LAB — THYROXINE BINDING GLOBULIN: TBG: 17.6 ug/mL (ref 13.5–30.9)

## 2015-02-17 ENCOUNTER — Encounter: Payer: Self-pay | Admitting: Internal Medicine

## 2015-02-22 ENCOUNTER — Encounter: Payer: Self-pay | Admitting: Nurse Practitioner

## 2015-02-22 ENCOUNTER — Ambulatory Visit (INDEPENDENT_AMBULATORY_CARE_PROVIDER_SITE_OTHER): Payer: Medicare Other | Admitting: Nurse Practitioner

## 2015-02-22 VITALS — Ht 61.0 in | Wt 156.0 lb

## 2015-02-22 DIAGNOSIS — I4891 Unspecified atrial fibrillation: Secondary | ICD-10-CM

## 2015-02-22 DIAGNOSIS — R030 Elevated blood-pressure reading, without diagnosis of hypertension: Secondary | ICD-10-CM | POA: Diagnosis not present

## 2015-02-22 DIAGNOSIS — IMO0001 Reserved for inherently not codable concepts without codable children: Secondary | ICD-10-CM

## 2015-02-22 LAB — COMPREHENSIVE METABOLIC PANEL
ALT: 32 U/L (ref 0–35)
AST: 22 U/L (ref 0–37)
Albumin: 4.1 g/dL (ref 3.5–5.2)
Alkaline Phosphatase: 80 U/L (ref 39–117)
BUN: 19 mg/dL (ref 6–23)
CO2: 28 mEq/L (ref 19–32)
Calcium: 9.6 mg/dL (ref 8.4–10.5)
Chloride: 104 mEq/L (ref 96–112)
Creatinine, Ser: 1.53 mg/dL — ABNORMAL HIGH (ref 0.40–1.20)
GFR: 35.58 mL/min — ABNORMAL LOW (ref >60.00)
Glucose, Bld: 111 mg/dL — ABNORMAL HIGH (ref 70–99)
Potassium: 4.9 mEq/L (ref 3.5–5.1)
Sodium: 138 mEq/L (ref 135–145)
Total Bilirubin: 0.8 mg/dL (ref 0.2–1.2)
Total Protein: 7.1 g/dL (ref 6.0–8.3)

## 2015-02-22 MED ORDER — AMIODARONE HCL 200 MG PO TABS: ORAL_TABLET | ORAL | Status: DC

## 2015-02-22 NOTE — Patient Instructions (Signed)
Medication Instructions:  Your physician has recommended you make the following change in your medication:  TAKE Amiodarone 200mg  Twice daily for 2 weeks. THEN take 200mg  daily. An Rx has been sent to your pharmacy   Labwork: Cmet Today  Testing/Procedures: Your physician has recommended that you have a pulmonary function test. Pulmonary Function Tests are a group of tests that measure how well air moves in and out of your lungs.   Follow-Up: Your physician recommends that you schedule a follow-up appointment in: 2 months with Dr.Hochrein   Any Other Special Instructions Will Be Listed Below (If Applicable). Measure your blood pressure daily for 1 week. Please call the office with your readings

## 2015-02-22 NOTE — Progress Notes (Signed)
Patient Name: Tiffany [name] Date of Encounter: 02/22/2015  Primary Care Provider:  Olga Millers, MD Primary Cardiologist:  Lenna Sciara. Hochrein, MD   Chief Complaint  69 year old female with a relatively recent diagnosis of paroxysmal atrial fibrillation who presents for follow-up today.  Past Medical History   Past Medical History  Diagnosis Date  . Obesity, unspecified   . URINARY INCONTINENCE 04-20-12    occ. with nighttime sleep pattern  . SYNCOPE, HX OF   . BENIGN POSITIONAL VERTIGO   . GERD   . DYSLIPIDEMIA   . Raynaud disease   . Complication of anesthesia 04-20-12    some issues with prolonged sedation after anesthesia  . Arthritis 04-20-12    Osteoarthritis-right hip  . HYPERTENSION     a. severe, pt intol of med tx, Pt. has severe "whitecoat" syndrome and refused medical therapy.  . Osteopenia   . Cataract   . Esophageal stricture   . Diverticulosis   . Hypothyroidism 09/29/2014    a. pt did not tolerate synthroid and this was subsequently discontinued.  . Atrial fibrillation status post cardioversion, 01/30/15 maintaining SR.  01/31/2015    a. 01/2015 s/p TEE/DCCV;  b. 02/06/2015 recurrent AF noted, amio added;  c. CHA2DS2VASc = 3-->eliquis.  . Mild LV dysfunction     a. 01/2015 Echo: EF 45-50%, mild MR, mildly dil LA, mod dil RV with mod to sev reduced fxn, mod-sev TR, PASP 44mmHg.   Past Surgical History  Procedure Laterality Date  . Nasal fracture surgery  2006  . Tubal ligation  1980  . Cholecystectomy      '90-"sludge"  . Total hip arthroplasty  04/26/2012    Procedure: TOTAL HIP ARTHROPLASTY ANTERIOR APPROACH;  Surgeon: Mauri Pole, MD;  Location: [facility];  Service: Orthopedics;  Laterality: Right;  . Breast ultrasound Right 09/13/13    There is no sonographic evidence of malignancy. the 123XX123 complicated cyst in (R) breast is consistent with a benign finding. repeat in 1 year  . Tee without cardioversion N/A 01/30/2015    Procedure: TRANSESOPHAGEAL  ECHOCARDIOGRAM (TEE);  Surgeon: Sanda Klein, MD;  Location: [facility];  Service: Cardiovascular;  Laterality: N/A;  . Cardioversion N/A 01/30/2015    Procedure: CARDIOVERSION;  Surgeon: Sanda Klein, MD;  Location: [facility];  Service: Cardiovascular;  Laterality: N/A;    Allergies  Allergies  Allergen Reactions  . Levothyroxine Shortness Of Breath and Other (See Comments)    Exhaustion also  . Statins Other (See Comments)    "Pain, weakness and kidney problems"  . Penicillins Other (See Comments)    dry mouth    HPI  69 year old female with the above complex past medical history. She has a long history of severe whitecoat hypertension and has been intolerant to ACE inhibitor and direct therapy in the past. She also has stable chronic kidney disease and question of prior thyroid disease with significant intolerance to levothyroxine therapy. In June, she was scheduled for cataract surgery and upon arrival, was noted to be in atrial fibrillation. She subsequently underwent TEE and cardioversion, which was successful. She was noted to have mild LV dysfunction with an EF of 45-50%. There was also some RV dysfunction, moderate to severe tricuspid regurgitation, and pulmonary hypertension. She was discharged on beta blocker therapy and was subsequently seen in the office on July 6. At that time, she was found be back in atrial fibrillation. Amiodarone 400 mg twice a day was added with instructions to reduce the dose to  400 mg daily after 2 weeks, and plan for repeat cardioversion if she did not convert to sinus rhythm. On July 8, she was seen by primary care and was noted to have a rise in her creatinine to 1.75. She was referred to the emergency room for IV fluids. She noted earlier in the day, that she was feeling dizzy and at one point during the day had hard, rapid palpitations and then subsequent quiescence of her heart rhythm. When she was in the ED, she was found to be in sinus rhythm.  Since then, she has noted improvement in baseline levels of dyspnea on exertion and fatigue. She is not aware of any recurrent palpitations. She denies chest pain, PND, orthopnea, dizziness, syncope, edema, or early satiety. She is hypertensive today in clinic on multiple repeat evaluations. She does have a list of blood pressures from home all of which are trending in the 120s to 130s. She is somewhat agitated that her blood pressure is up.  Home Medications  Prior to Admission medications   Medication Sig Start Date End Date Taking? Authorizing Provider  acetaminophen (TYLENOL) 325 MG tablet Take 2 tablets (650 mg total) by mouth every 4 (four) hours as needed for headache or mild pain. 01/29/15  Yes Luke K Kilroy, PA-C  amiodarone (PACERONE) 200 MG tablet Take 200mg  by mouthTwice daily for 2 weeks. Then take 200mg  daily 02/22/15  Yes Rogelia Mire, NP  apixaban (ELIQUIS) 5 MG TABS tablet Take 1 tablet (5 mg total) by mouth 2 (two) times daily. 01/31/15  Yes Isaiah Serge, NP  calcium carbonate (OS-CAL) 600 MG TABS Take 600 mg by mouth daily.     Yes Historical Provider, MD  clindamycin (CLEOCIN) 150 MG capsule Use for dental procedures only 03/19/14  Yes Historical Provider, MD  diazepam (VALIUM) 5 MG tablet Take 1 tablet (5 mg total) by mouth every 12 (twelve) hours as needed for anxiety. for anxiety 11/22/14  Yes Rowe Clack, MD  estradiol (ESTRACE VAGINAL) 0.1 MG/GM vaginal cream Place 1 Applicatorful vaginally once a week. 1 gram once weekly Patient taking differently: Place 1 Applicatorful vaginally once a week. 1 gram once weekly on Sat or sun 01/29/15  Yes Luke K Kilroy, PA-C  famotidine (PEPCID AC) 10 MG tablet Take 1 tablet (10 mg total) by mouth daily. 01/29/15  Yes Luke K Kilroy, PA-C  fluticasone (FLONASE) 50 MCG/ACT nasal spray Place 1 spray into both nostrils daily as needed for allergies or rhinitis. 01/29/15  Yes Luke K Kilroy, PA-C  metoprolol (LOPRESSOR) 50 MG tablet Take  0.5 tablets (25 mg total) by mouth 2 (two) times daily. 01/31/15  Yes Isaiah Serge, NP  miconazole (MICOTIN) 2 % powder Apply topically as needed for itching. 09/26/14  Yes Rowe Clack, MD  Multiple Vitamins-Minerals (CENTRUM SILVER) tablet Take 1 tablet by mouth daily.    Yes Historical Provider, MD  Omega 3 1000 MG CAPS Take 2,000 mg by mouth daily.   Yes Historical Provider, MD    Review of Systems  As above, she has been doing well since her most recent conversion to sinus rhythm. She denies PND, orthopnea, dizziness, syncope, edema, or early satiety.  All other systems reviewed and are otherwise negative except as noted above.  Physical Exam  VS:  Ht 5\' 1"  (1.549 m)  Wt 156 lb (70.761 kg)  BMI 29.49 kg/m2 , BMI Body mass index is 29.49 kg/(m^2). blood pressure was 190/110, 200/110 on repeat. Heart rate  62, respirations 16. GEN: Well nourished, well developed, in no acute distress. HEENT: normal. Neck: Supple, no JVD, carotid bruits, or masses. Cardiac: RRR, 2/6 systolic murmur at the left lower sternal border, no rubs, or gallops. No clubbing, cyanosis, edema.  Radials/DP/PT 2+ and equal bilaterally.  Respiratory:  Respirations regular and unlabored, clear to auscultation bilaterally. GI: Soft, nontender, nondistended, BS + x 4. MS: no deformity or atrophy. Skin: warm and dry, no rash. Neuro:  Strength and sensation are intact. Psych: Normal affect.  Accessory Clinical Findings  ECG - regular sinus rhythm, 62, left axis, poor R-wave progression, inferior and anterolateral T changes-no acute changes.  Assessment & Plan  1.  Paroxysmal atrial fibrillation: Patient is now maintaining sinus rhythm on amiodarone. She is currently on 400 mg daily. She does need a new prescription for this and we will refill it at 200 mg twice a day for the next 2 weeks to be followed by 200 mg daily. As she is now on amiodarone therapy, we will follow-up liver function testing and also schedule  pulmonary function testing. She recently had thyroid function evaluation by primary care. Free T3 and total T4 were within normal limits on July 13, while TSH was normal on June 27. Free T4 mildly elevated that same day. We will need to continue to follow thyroid function in the setting of ongoing amiodarone therapy. She remains on eliquis and is tolerating this well.  2. Whitecoat hypertension: Per records, patient has a h/o severe whitecoat hypertension. Pressures are markedly elevated in the 190s to 200's today. She had previously been on lisinopril HCTZ but did not tolerate this and is very reluctant to consider initiation of an alternate antihypertensive. We discussed options for management including watchful waiting with daily monitoring of blood pressure at home versus initiation of a low-dose of an antihypertensive today. She is upset that her blood pressures elevated and is adamant that she does not want to start any antihypertensive therapy. She will check her blood pressures daily and contact our office in one week or sooner to go over her readings. She agreed that if her pressures are consistently greater than 140, that she would require at least a low-dose of antihypertensive therapy. In the meantime, in the setting of PAF, she does remain on low-dose beta blocker and is tolerating this. She is reluctant to titrate this further and with relative bradycardia, she would likely require an additional agent.  3. Stage III chronic kidney disease: Stable.  4. Disposition: Patient will follow up her blood pressures daily and call in her recordings to our office within the week.  We will arrange for follow-up with Dr. Percival Spanish within the next 3 months.  Murray Hodgkins, NP 02/22/2015, 5:33 PM

## 2015-02-27 ENCOUNTER — Ambulatory Visit (INDEPENDENT_AMBULATORY_CARE_PROVIDER_SITE_OTHER): Payer: Medicare Other | Admitting: Internal Medicine

## 2015-02-27 DIAGNOSIS — I4891 Unspecified atrial fibrillation: Secondary | ICD-10-CM | POA: Diagnosis not present

## 2015-02-27 LAB — PULMONARY FUNCTION TEST
DL/VA % pred: 81 %
DL/VA: 3.46 ml/min/mmHg/L
DLCO unc % pred: 60 %
DLCO unc: 11.35 ml/min/mmHg
FEF 25-75 Post: 1.9 L/sec
FEF 25-75 Pre: 1.74 L/sec
FEF2575-%Change-Post: 9 %
FEF2575-%Pred-Post: 116 %
FEF2575-%Pred-Pre: 106 %
FEV1-%Change-Post: 3 %
FEV1-%Pred-Post: 94 %
FEV1-%Pred-Pre: 91 %
FEV1-Post: 1.77 L
FEV1-Pre: 1.71 L
FEV1FVC-%Change-Post: 4 %
FEV1FVC-%Pred-Pre: 106 %
FEV6-%Change-Post: -1 %
FEV6-%Pred-Post: 87 %
FEV6-%Pred-Pre: 89 %
FEV6-Post: 2.08 L
FEV6-Pre: 2.12 L
FEV6FVC-%Pred-Post: 105 %
FEV6FVC-%Pred-Pre: 105 %
FVC-%Change-Post: -1 %
FVC-%Pred-Post: 83 %
FVC-%Pred-Pre: 85 %
FVC-Post: 2.08 L
FVC-Pre: 2.12 L
Post FEV1/FVC ratio: 85 %
Post FEV6/FVC ratio: 100 %
Pre FEV1/FVC ratio: 81 %
Pre FEV6/FVC Ratio: 100 %
RV % pred: 69 %
RV: 1.38 L
TLC % pred: 83 %
TLC: 3.71 L

## 2015-02-27 NOTE — Progress Notes (Signed)
PFT done today. 

## 2015-03-11 ENCOUNTER — Ambulatory Visit: Payer: Medicare Other | Admitting: Internal Medicine

## 2015-03-12 ENCOUNTER — Ambulatory Visit (INDEPENDENT_AMBULATORY_CARE_PROVIDER_SITE_OTHER): Payer: Medicare Other | Admitting: Internal Medicine

## 2015-03-12 ENCOUNTER — Encounter: Payer: Self-pay | Admitting: Internal Medicine

## 2015-03-12 VITALS — BP 134/88 | HR 72 | Temp 97.9°F | Ht 61.0 in | Wt 155.0 lb

## 2015-03-12 DIAGNOSIS — J329 Chronic sinusitis, unspecified: Secondary | ICD-10-CM

## 2015-03-12 DIAGNOSIS — J019 Acute sinusitis, unspecified: Secondary | ICD-10-CM

## 2015-03-12 DIAGNOSIS — F419 Anxiety disorder, unspecified: Secondary | ICD-10-CM

## 2015-03-12 MED ORDER — AZITHROMYCIN 250 MG PO TABS: ORAL_TABLET | ORAL | Status: DC

## 2015-03-12 NOTE — Progress Notes (Signed)
Subjective:    Patient ID: Tiffany [name], female    DOB: 04-Oct-1945, 69 y.o.   MRN: [id number]  HPI   Here with 1 wk acute onset fever, facial pain, pressure, headache, general weakness and malaise, and greenish d/c, with mild ST and cough, but pt denies chest pain, wheezing, increased sob or doe, orthopnea, PND, increased LE swelling, palpitations, dizziness or syncope.  Has other chronic sinus symptoms previously controlled with nasal steroid.  Has had signficant issues with recurrence afib, has been stable recently and does not think recurred recently, but did have another "spell" last wk x 1 she has had several times in past few months of sudden onset sweats, weakness.  Denies worsening depressive symptoms, suicidal ideation, or panic; has ongoing anxiety Past Medical History  Diagnosis Date  . Obesity, unspecified   . URINARY INCONTINENCE 04-20-12    occ. with nighttime sleep pattern  . SYNCOPE, HX OF   . BENIGN POSITIONAL VERTIGO   . GERD   . DYSLIPIDEMIA   . Raynaud disease   . Complication of anesthesia 04-20-12    some issues with prolonged sedation after anesthesia  . Arthritis 04-20-12    Osteoarthritis-right hip  . HYPERTENSION     a. severe, pt intol of med tx, Pt. has severe "whitecoat" syndrome and refused medical therapy.  . Osteopenia   . Cataract   . Esophageal stricture   . Diverticulosis   . Hypothyroidism 09/29/2014    a. pt did not tolerate synthroid and this was subsequently discontinued.  . Atrial fibrillation status post cardioversion, 01/30/15 maintaining SR.  01/31/2015    a. 01/2015 s/p TEE/DCCV;  b. 02/06/2015 recurrent AF noted, amio added;  c. CHA2DS2VASc = 3-->eliquis.  . Mild LV dysfunction     a. 01/2015 Echo: EF 45-50%, mild MR, mildly dil LA, mod dil RV with mod to sev reduced fxn, mod-sev TR, PASP 75mmHg.   Past Surgical History  Procedure Laterality Date  . Nasal fracture surgery  2006  . Tubal ligation  1980  . Cholecystectomy      '90-"sludge"  .  Total hip arthroplasty  04/26/2012    Procedure: TOTAL HIP ARTHROPLASTY ANTERIOR APPROACH;  Surgeon: Mauri Pole, MD;  Location: [facility];  Service: Orthopedics;  Laterality: Right;  . Breast ultrasound Right 09/13/13    There is no sonographic evidence of malignancy. the 123XX123 complicated cyst in (R) breast is consistent with a benign finding. repeat in 1 year  . Tee without cardioversion N/A 01/30/2015    Procedure: TRANSESOPHAGEAL ECHOCARDIOGRAM (TEE);  Surgeon: Sanda Klein, MD;  Location: [facility];  Service: Cardiovascular;  Laterality: N/A;  . Cardioversion N/A 01/30/2015    Procedure: CARDIOVERSION;  Surgeon: Sanda Klein, MD;  Location: [facility];  Service: Cardiovascular;  Laterality: N/A;    reports that she has never smoked. She has never used smokeless tobacco. She reports that she drinks about 1.2 oz of alcohol per week. She reports that she does not use illicit drugs. family history includes Other in an other family member. She was adopted. Allergies  Allergen Reactions  . Levothyroxine Shortness Of Breath and Other (See Comments)    Exhaustion also  . Statins Other (See Comments)    "Pain, weakness and kidney problems"  . Penicillins Other (See Comments)    dry mouth   Current Outpatient Prescriptions on File Prior to Visit  Medication Sig Dispense Refill  . acetaminophen (TYLENOL) 325 MG tablet Take 2 tablets (650 mg total) by  mouth every 4 (four) hours as needed for headache or mild pain.    Marland Kitchen amiodarone (PACERONE) 200 MG tablet Take 200mg  by mouthTwice daily for 2 weeks. Then take 200mg  daily 60 tablet 5  . apixaban (ELIQUIS) 5 MG TABS tablet Take 1 tablet (5 mg total) by mouth 2 (two) times daily. 60 tablet 0  . calcium carbonate (OS-CAL) 600 MG TABS Take 600 mg by mouth daily.      . clindamycin (CLEOCIN) 150 MG capsule Use for dental procedures only    . diazepam (VALIUM) 5 MG tablet Take 1 tablet (5 mg total) by mouth every 12 (twelve) hours as needed for  anxiety. for anxiety 40 tablet 0  . estradiol (ESTRACE VAGINAL) 0.1 MG/GM vaginal cream Place 1 Applicatorful vaginally once a week. 1 gram once weekly (Patient taking differently: Place 1 Applicatorful vaginally once a week. 1 gram once weekly on Sat or sun) 42.5 g 1  . famotidine (PEPCID AC) 10 MG tablet Take 1 tablet (10 mg total) by mouth daily.    . fluticasone (FLONASE) 50 MCG/ACT nasal spray Place 1 spray into both nostrils daily as needed for allergies or rhinitis. 16 g 2  . metoprolol (LOPRESSOR) 50 MG tablet Take 0.5 tablets (25 mg total) by mouth 2 (two) times daily. 60 tablet 11  . miconazole (MICOTIN) 2 % powder Apply topically as needed for itching. 70 g 0  . Multiple Vitamins-Minerals (CENTRUM SILVER) tablet Take 1 tablet by mouth daily.     . Omega 3 1000 MG CAPS Take 2,000 mg by mouth daily.     No current facility-administered medications on file prior to visit.    Review of Systems  Constitutional: Negative for unusual diaphoresis or night sweats HENT: Negative for ringing in ear or discharge Eyes: Negative for double vision or worsening visual disturbance.  Respiratory: Negative for choking and stridor.   Gastrointestinal: Negative for vomiting or other signifcant bowel change Genitourinary: Negative for hematuria or change in urine volume.  Musculoskeletal: Negative for other MSK pain or swelling Skin: Negative for color change and worsening wound.  Neurological: Negative for tremors and numbness other than noted  Psychiatric/Behavioral: Negative for decreased concentration or agitation other than above       Objective:   Physical Exam BP 134/88 mmHg  Pulse 72  Temp(Src) 97.9 F (36.6 C) (Oral)  Ht 5\' 1"  (1.549 m)  Wt 155 lb (70.308 kg)  BMI 29.30 kg/m2  SpO2 98% VS noted, mild ill Constitutional: Pt appears in no significant distress HENT: Head: NCAT.  Right Ear: External ear normal.  Left Ear: External ear normal.  Eyes: . Pupils are equal, round, and  reactive to light. Conjunctivae and EOM are normal Bilat tm's with mild erythema.  Max sinus areas mild tender.  Pharynx with mild erythema, no exudate Neck: Normal range of motion. Neck supple. with bilat tender submandib LA Cardiovascular: Normal rate and regular rhythm.   Pulmonary/Chest: Effort normal and breath sounds without rales or wheezing.  Neurological: Pt is alert. Not confused , motor grossly intact Skin: Skin is warm. No rash, no LE edema Psychiatric: Pt behavior is normal. No agitation. 1-2+ nervous    Assessment & Plan:

## 2015-03-12 NOTE — Assessment & Plan Note (Addendum)
Without panic,  to f/u any worsening symptoms or concerns, cont all current tx, pt plans to f/u with PCP

## 2015-03-12 NOTE — Patient Instructions (Addendum)
Please take all new medication as prescribe  You can also take Delsym OTC for cough, and/or Mucinex (or it's generic off brand) for congestion, and tylenol as needed for pain.  Tiffany [name] continue all other medications as before, and refills have been done if requested.  Please have the pharmacy call with any other refills you may need.  Please continue your efforts at being more active, low cholesterol diet, and weight control.  Please keep your appointments with your specialists as you may have planned

## 2015-03-12 NOTE — Assessment & Plan Note (Signed)
Mild to mod, for antibx course,  to f/u any worsening symptoms or concerns 

## 2015-03-12 NOTE — Assessment & Plan Note (Signed)
Overall stable recently, to cont flonase, can also take mucinex otc bid prn as well as long as not taking decongestant

## 2015-03-12 NOTE — Progress Notes (Signed)
Pre visit review using our clinic review tool, if applicable. No additional management support is needed unless otherwise documented below in the visit note. 

## 2015-03-25 ENCOUNTER — Encounter: Payer: Self-pay | Admitting: Internal Medicine

## 2015-03-25 ENCOUNTER — Other Ambulatory Visit (INDEPENDENT_AMBULATORY_CARE_PROVIDER_SITE_OTHER): Payer: Medicare Other

## 2015-03-25 ENCOUNTER — Other Ambulatory Visit: Payer: Self-pay | Admitting: Cardiology

## 2015-03-25 ENCOUNTER — Ambulatory Visit (INDEPENDENT_AMBULATORY_CARE_PROVIDER_SITE_OTHER): Payer: Medicare Other | Admitting: Internal Medicine

## 2015-03-25 VITALS — BP 132/78 | HR 60 | Temp 98.0°F | Resp 18 | Ht 60.0 in | Wt 154.8 lb

## 2015-03-25 DIAGNOSIS — M791 Myalgia, unspecified site: Secondary | ICD-10-CM

## 2015-03-25 DIAGNOSIS — R0602 Shortness of breath: Secondary | ICD-10-CM | POA: Diagnosis not present

## 2015-03-25 DIAGNOSIS — IMO0001 Reserved for inherently not codable concepts without codable children: Secondary | ICD-10-CM

## 2015-03-25 DIAGNOSIS — R0600 Dyspnea, unspecified: Secondary | ICD-10-CM | POA: Diagnosis not present

## 2015-03-25 DIAGNOSIS — M609 Myositis, unspecified: Secondary | ICD-10-CM

## 2015-03-25 DIAGNOSIS — N179 Acute kidney failure, unspecified: Secondary | ICD-10-CM

## 2015-03-25 LAB — BASIC METABOLIC PANEL
BUN: 23 mg/dL (ref 6–23)
CO2: 26 mEq/L (ref 19–32)
Calcium: 9.5 mg/dL (ref 8.4–10.5)
Chloride: 104 mEq/L (ref 96–112)
Creatinine, Ser: 1.52 mg/dL — ABNORMAL HIGH (ref 0.40–1.20)
GFR: 35.84 mL/min — ABNORMAL LOW (ref >60.00)
Glucose, Bld: 98 mg/dL (ref 70–99)
Potassium: 4.9 mEq/L (ref 3.5–5.1)
Sodium: 137 mEq/L (ref 135–145)

## 2015-03-25 LAB — CK: Total CK: 32 U/L (ref 7–177)

## 2015-03-25 LAB — BRAIN NATRIURETIC PEPTIDE: Pro B Natriuretic peptide (BNP): 618 pg/mL — ABNORMAL HIGH (ref 0.0–100.0)

## 2015-03-25 NOTE — Patient Instructions (Signed)
We will check some blood work for the muscle enzymes and the kidneys. We will call you back with the results.

## 2015-03-25 NOTE — Progress Notes (Signed)
Pre visit review using our clinic review tool, if applicable. No additional management support is needed unless otherwise documented below in the visit note. 

## 2015-03-26 DIAGNOSIS — R0602 Shortness of breath: Secondary | ICD-10-CM | POA: Insufficient documentation

## 2015-03-26 DIAGNOSIS — IMO0001 Reserved for inherently not codable concepts without codable children: Secondary | ICD-10-CM | POA: Insufficient documentation

## 2015-03-26 NOTE — Assessment & Plan Note (Signed)
Checking BMP today for stability/improvement.

## 2015-03-26 NOTE — Assessment & Plan Note (Signed)
Checking CK for any signs of polymyositis. Possibly related to recent medication change, unclear etiology. No weight change, rash, fevers, chill.

## 2015-03-26 NOTE — Assessment & Plan Note (Signed)
Checking BNP as was elevated in the past. Appears to be in sinus on exam so not likely sign of A fib. No signs of lung infection or problem. Recent PFT within normal limits.

## 2015-03-26 NOTE — Progress Notes (Signed)
   Subjective:    Patient ID: Tiffany [name], female    DOB: Mar 19, 1946, 69 y.o.   MRN: [id number]  HPI The patient is a 69 YO female coming in with several acute concerns. She is following up on a sinus infection, took antibiotics for it and is feeling better overall. Still some sinus drainage and some SOB. No fevers or chills. No decreased hearing, sore throat.  Next complaint is that she is worried about her lungs and the amiodarone (recent PFTs reviewed with her at the visit and no signs of lung disease or damage). She does get SOB more often lately with walking. Denies night time SOB or waking up with SOB. No more swelling in her legs. No noticeable cough or sputum.  Next complaint is myalgias that have been going on since she had the Atrial fibrillation with her cataract. She states that it feels like when she was taking statins but is not currently taking them. Amiodarone is the new medicine in that time. Is now taking only daily.   Review of Systems  Constitutional: Positive for activity change and fatigue. Negative for chills, appetite change and unexpected weight change.  HENT: Positive for postnasal drip. Negative for congestion, ear pain, rhinorrhea, sinus pressure and sore throat.   Respiratory: Positive for shortness of breath. Negative for cough, chest tightness and wheezing.   Cardiovascular: Negative for chest pain, palpitations and leg swelling.  Gastrointestinal: Negative for abdominal pain, constipation and abdominal distention.  Endocrine: Negative for cold intolerance and heat intolerance.  Musculoskeletal: Negative.   Neurological: Negative for dizziness, seizures, syncope, speech difficulty, weakness, light-headedness and headaches.      Objective:   Physical Exam  Constitutional: She is oriented to person, place, and time. She appears well-developed and well-nourished.  HENT:  Head: Normocephalic and atraumatic.  Eyes: EOM are normal.  Neck: Normal range of motion.    Cardiovascular: Normal rate and regular rhythm.   Appears regular  Pulmonary/Chest: Effort normal and breath sounds normal.  Abdominal: Soft. She exhibits no distension. There is no tenderness. There is no rebound.  Musculoskeletal: She exhibits no edema.  Neurological: She is alert and oriented to person, place, and time. Coordination normal.  Skin: Skin is warm and dry.  Psychiatric: She has a normal mood and affect.   Filed Vitals:   03/25/15 1514  BP: 132/78  Pulse: 60  Temp: 98 F (36.7 C)  TempSrc: Oral  Resp: 18  Height: 5' (1.524 m)  Weight: 154 lb 12.8 oz (70.217 kg)      Assessment & Plan:

## 2015-04-12 DIAGNOSIS — Z23 Encounter for immunization: Secondary | ICD-10-CM | POA: Diagnosis not present

## 2015-04-14 ENCOUNTER — Encounter (HOSPITAL_COMMUNITY): Payer: Self-pay | Admitting: Nurse Practitioner

## 2015-04-14 ENCOUNTER — Observation Stay (HOSPITAL_COMMUNITY)
Admission: EM | Admit: 2015-04-14 | Discharge: 2015-04-17 | Disposition: A | Discharge status: Home / Self Care | Payer: Medicare Other | Attending: Internal Medicine | Admitting: Internal Medicine

## 2015-04-14 ENCOUNTER — Emergency Department (HOSPITAL_COMMUNITY): Payer: Medicare Other

## 2015-04-14 DIAGNOSIS — N182 Chronic kidney disease, stage 2 (mild): Secondary | ICD-10-CM | POA: Diagnosis not present

## 2015-04-14 DIAGNOSIS — I480 Paroxysmal atrial fibrillation: Secondary | ICD-10-CM | POA: Insufficient documentation

## 2015-04-14 DIAGNOSIS — R55 Syncope and collapse: Principal | ICD-10-CM | POA: Insufficient documentation

## 2015-04-14 DIAGNOSIS — R262 Difficulty in walking, not elsewhere classified: Secondary | ICD-10-CM | POA: Diagnosis not present

## 2015-04-14 DIAGNOSIS — W109XXA Fall (on) (from) unspecified stairs and steps, initial encounter: Secondary | ICD-10-CM | POA: Insufficient documentation

## 2015-04-14 DIAGNOSIS — I129 Hypertensive chronic kidney disease with stage 1 through stage 4 chronic kidney disease, or unspecified chronic kidney disease: Secondary | ICD-10-CM | POA: Insufficient documentation

## 2015-04-14 DIAGNOSIS — E785 Hyperlipidemia, unspecified: Secondary | ICD-10-CM | POA: Insufficient documentation

## 2015-04-14 DIAGNOSIS — R0600 Dyspnea, unspecified: Secondary | ICD-10-CM | POA: Diagnosis not present

## 2015-04-14 DIAGNOSIS — Z7901 Long term (current) use of anticoagulants: Secondary | ICD-10-CM | POA: Insufficient documentation

## 2015-04-14 DIAGNOSIS — E039 Hypothyroidism, unspecified: Secondary | ICD-10-CM | POA: Diagnosis present

## 2015-04-14 DIAGNOSIS — R0602 Shortness of breath: Secondary | ICD-10-CM | POA: Diagnosis not present

## 2015-04-14 DIAGNOSIS — K219 Gastro-esophageal reflux disease without esophagitis: Secondary | ICD-10-CM | POA: Diagnosis not present

## 2015-04-14 DIAGNOSIS — I10 Essential (primary) hypertension: Secondary | ICD-10-CM | POA: Diagnosis present

## 2015-04-14 DIAGNOSIS — I4891 Unspecified atrial fibrillation: Secondary | ICD-10-CM | POA: Diagnosis not present

## 2015-04-14 DIAGNOSIS — F419 Anxiety disorder, unspecified: Secondary | ICD-10-CM | POA: Diagnosis not present

## 2015-04-14 DIAGNOSIS — R001 Bradycardia, unspecified: Secondary | ICD-10-CM | POA: Insufficient documentation

## 2015-04-14 LAB — URINE MICROSCOPIC-ADD ON

## 2015-04-14 LAB — CBC
HCT: 42.2 % (ref 36.0–46.0)
Hemoglobin: 14 g/dL (ref 12.0–15.0)
MCH: 31.5 pg (ref 26.0–34.0)
MCHC: 33.2 g/dL (ref 30.0–36.0)
MCV: 94.8 fL (ref 78.0–100.0)
Platelets: 196 10*3/uL (ref 150–400)
RBC: 4.45 MIL/uL (ref 3.87–5.11)
RDW: 17.4 % — ABNORMAL HIGH (ref 11.5–15.5)
WBC: 6.1 10*3/uL (ref 4.0–10.5)

## 2015-04-14 LAB — BASIC METABOLIC PANEL
Anion gap: 8 (ref 5–15)
BUN: 20 mg/dL (ref 6–20)
CO2: 23 mmol/L (ref 22–32)
Calcium: 9.5 mg/dL (ref 8.9–10.3)
Chloride: 108 mmol/L (ref 101–111)
Creatinine, Ser: 1.59 mg/dL — ABNORMAL HIGH (ref 0.44–1.00)
GFR calc Af Amer: 37 mL/min — ABNORMAL LOW (ref >60)
GFR calc non Af Amer: 32 mL/min — ABNORMAL LOW (ref >60)
Glucose, Bld: 114 mg/dL — ABNORMAL HIGH (ref 65–99)
Potassium: 4.8 mmol/L (ref 3.5–5.1)
Sodium: 139 mmol/L (ref 135–145)

## 2015-04-14 LAB — URINALYSIS, ROUTINE W REFLEX MICROSCOPIC
Bilirubin Urine: NEGATIVE
Glucose, UA: NEGATIVE mg/dL
Hgb urine dipstick: NEGATIVE
Ketones, ur: NEGATIVE mg/dL
Nitrite: NEGATIVE
Protein, ur: NEGATIVE mg/dL
Specific Gravity, Urine: 1.018 (ref 1.005–1.030)
Urobilinogen, UA: 0.2 mg/dL (ref 0.0–1.0)
pH: 6.5 (ref 5.0–8.0)

## 2015-04-14 LAB — BRAIN NATRIURETIC PEPTIDE: B Natriuretic Peptide: 849.6 pg/mL — ABNORMAL HIGH (ref 0.0–100.0)

## 2015-04-14 LAB — I-STAT TROPONIN, ED: Troponin i, poc: 0.04 ng/mL (ref 0.00–0.08)

## 2015-04-14 LAB — CBG MONITORING, ED: Glucose-Capillary: 83 mg/dL (ref 65–99)

## 2015-04-14 MED ORDER — FUROSEMIDE 10 MG/ML IJ SOLN
20.0000 mg | Freq: Once | INTRAMUSCULAR | Status: DC
  Filled 2015-04-14: qty 2

## 2015-04-14 MED ORDER — LORAZEPAM 1 MG PO TABS
1.0000 mg | ORAL_TABLET | Freq: Once | ORAL | Status: AC
  Administered 2015-04-14: 1 mg via ORAL
  Filled 2015-04-14: qty 1

## 2015-04-14 MED ORDER — HYDRALAZINE HCL 20 MG/ML IJ SOLN
5.0000 mg | Freq: Once | INTRAMUSCULAR | Status: DC
  Filled 2015-04-14 (×2): qty 1

## 2015-04-14 MED ORDER — METOPROLOL TARTRATE 25 MG PO TABS
25.0000 mg | ORAL_TABLET | Freq: Two times a day (BID) | ORAL | Status: DC
  Administered 2015-04-14 – 2015-04-16 (×4): 25 mg via ORAL
  Filled 2015-04-14 (×5): qty 1

## 2015-04-14 MED ORDER — APIXABAN 5 MG PO TABS
5.0000 mg | ORAL_TABLET | Freq: Two times a day (BID) | ORAL | Status: DC
  Administered 2015-04-14 – 2015-04-17 (×6): 5 mg via ORAL
  Filled 2015-04-14 (×6): qty 1

## 2015-04-14 NOTE — ED Notes (Signed)
Spoke with April, Rapid response RN. Patient to be seen by admitting physician, clarification for bed type placement. Patient refusing bp meds at this time.

## 2015-04-14 NOTE — ED Notes (Signed)
Discussed plan of care with Dr. Ralene Bathe, patient's bp still high, over [id number] systolic. MD acknowleges, no  New orders. Patient to ambulate with pulse ox.

## 2015-04-14 NOTE — ED Notes (Signed)
Pt from home was walking around the garden outside and went inside and was feeling lightheaded & dizzy and knees collapsed on the ground denies injuries. patient denies LOC. Denies unilateral weakness, trouble with speech, headaches, palpitations or chest pain. Patient also endorses ShOB that has been constant over the past few weeks and endorses arthalgias.

## 2015-04-14 NOTE — ED Notes (Signed)
Discussed plan of care with Dr. Ralene Bathe. Meds ordered for bp management.

## 2015-04-14 NOTE — ED Notes (Signed)
Patient transported to X-ray 

## 2015-04-14 NOTE — ED Notes (Signed)
Patient ambulated approximately 237ft around nursing station with this RN for supervision. Pulse ox reading 93-95% on room air, with heart rate in the 90s. Gait steady, no dizziness noted. On return to stretcher, patient reports mild shortness of breath. Placed back on monitor.

## 2015-04-14 NOTE — ED Notes (Signed)
Patient fingers cold. Will retake.

## 2015-04-14 NOTE — ED Notes (Signed)
Dr. Ralene Bathe at the bedside for update and explanation of bp meds.

## 2015-04-14 NOTE — ED Notes (Signed)
hospitalist at the bedside 

## 2015-04-14 NOTE — ED Provider Notes (Signed)
CSN: [id number]     Arrival date & time 04/14/15  1522 History   First MD Initiated Contact with Patient 04/14/15 1614     Chief Complaint  Patient presents with  . Near Syncope     Patient is a 69 y.o. female presenting with near-syncope. The history is provided by the patient. No language interpreter was used.  Near Syncope   Ms. Guthridge presents for dyspnea on exertion and near syncope. She reports that she's had progressive dyspnea on exertion over the last several days. Today after going out to fill the bird bath (50 steps) and returning back to the house she was standing in the kitchen and began to feel lightheaded and fell to the floor. She did not hit her head. She does not believe that she lost consciousness. Her husband states that she appeared very short of breath during this episode. She does endorse significant dyspnea on exertion. She denies any fevers, chest pain, cough, change in her lower extremity edema. She does endorse bilateral shoulder and hip arthralgias over the last several weeks. She was recently admitted to the hospital and diagnosed with atrial fibrillation. She also has significant whitecoat hypertension. She takes Eliquis for afib.  Past Medical History  Diagnosis Date  . Obesity, unspecified   . URINARY INCONTINENCE 04-20-12    occ. with nighttime sleep pattern  . SYNCOPE, HX OF   . BENIGN POSITIONAL VERTIGO   . GERD   . DYSLIPIDEMIA   . Raynaud disease   . Complication of anesthesia 04-20-12    some issues with prolonged sedation after anesthesia  . Arthritis 04-20-12    Osteoarthritis-right hip  . HYPERTENSION     a. severe, pt intol of med tx, Pt. has severe "whitecoat" syndrome and refused medical therapy.  . Osteopenia   . Cataract   . Esophageal stricture   . Diverticulosis   . Hypothyroidism 09/29/2014    a. pt did not tolerate synthroid and this was subsequently discontinued.  . Atrial fibrillation status post cardioversion, 01/30/15 maintaining  SR.  01/31/2015    a. 01/2015 s/p TEE/DCCV;  b. 02/06/2015 recurrent AF noted, amio added;  c. CHA2DS2VASc = 3-->eliquis.  . Mild LV dysfunction     a. 01/2015 Echo: EF 45-50%, mild MR, mildly dil LA, mod dil RV with mod to sev reduced fxn, mod-sev TR, PASP 15mmHg.   Past Surgical History  Procedure Laterality Date  . Nasal fracture surgery  2006  . Tubal ligation  1980  . Cholecystectomy      '90-"sludge"  . Total hip arthroplasty  04/26/2012    Procedure: TOTAL HIP ARTHROPLASTY ANTERIOR APPROACH;  Surgeon: Mauri Pole, MD;  Location: [facility];  Service: Orthopedics;  Laterality: Right;  . Breast ultrasound Right 09/13/13    There is no sonographic evidence of malignancy. the 123XX123 complicated cyst in (R) breast is consistent with a benign finding. repeat in 1 year  . Tee without cardioversion N/A 01/30/2015    Procedure: TRANSESOPHAGEAL ECHOCARDIOGRAM (TEE);  Surgeon: Sanda Klein, MD;  Location: [place] [facility];  Service: Cardiovascular;  Laterality: N/A;  . Cardioversion N/A 01/30/2015    Procedure: CARDIOVERSION;  Surgeon: Sanda Klein, MD;  Location: [facility];  Service: Cardiovascular;  Laterality: N/A;   Family History  Problem Relation Age of Onset  . Adopted: Yes  . Other      patient was adopted   Social History  Substance Use Topics  . Smoking status: Never Smoker   . Smokeless tobacco:  Never Used  . Alcohol Use: 1.2 oz/week    2 Glasses of wine per week     Comment: several glasses wine weekly   OB History    No data available     Review of Systems  Cardiovascular: Positive for near-syncope.  All other systems reviewed and are negative.     Allergies  Levothyroxine; Statins; and Penicillins  Home Medications   Prior to Admission medications   Medication Sig Start Date End Date Taking? Authorizing Provider  acetaminophen (TYLENOL) 325 MG tablet Take 2 tablets (650 mg total) by mouth every 4 (four) hours as needed for headache or mild pain. 01/29/15   Erlene Quan, PA-C  amiodarone (PACERONE) 200 MG tablet Take 200mg  by mouthTwice daily for 2 weeks. Then take 200mg  daily 02/22/15   Rogelia Mire, NP  calcium carbonate (OS-CAL) 600 MG TABS Take 600 mg by mouth daily.      Historical Provider, MD  clindamycin (CLEOCIN) 150 MG capsule Use for dental procedures only 03/19/14   Historical Provider, MD  diazepam (VALIUM) 5 MG tablet Take 1 tablet (5 mg total) by mouth every 12 (twelve) hours as needed for anxiety. for anxiety 11/22/14   Rowe Clack, MD  ELIQUIS 5 MG TABS tablet TAKE 1 TABLET BY MOUTH TWICE A DAY 03/25/15   Minus Breeding, MD  estradiol (ESTRACE VAGINAL) 0.1 MG/GM vaginal cream Place 1 Applicatorful vaginally once a week. 1 gram once weekly Patient taking differently: Place 1 Applicatorful vaginally once a week. 1 gram once weekly on Sat or sun 01/29/15   Erlene Quan, PA-C  famotidine (PEPCID AC) 10 MG tablet Take 1 tablet (10 mg total) by mouth daily. 01/29/15   Erlene Quan, PA-C  fluticasone (FLONASE) 50 MCG/ACT nasal spray Place 1 spray into both nostrils daily as needed for allergies or rhinitis. Patient not taking: Reported on 03/25/2015 01/29/15   Erlene Quan, PA-C  guaiFENesin (MUCINEX) 600 MG 12 hr tablet Take by mouth 2 (two) times daily.    Historical Provider, MD  metoprolol (LOPRESSOR) 50 MG tablet Take 0.5 tablets (25 mg total) by mouth 2 (two) times daily. 01/31/15   Isaiah Serge, NP  miconazole (MICOTIN) 2 % powder Apply topically as needed for itching. 09/26/14   Rowe Clack, MD  Multiple Vitamins-Minerals (CENTRUM SILVER) tablet Take 1 tablet by mouth daily.     Historical Provider, MD  Omega 3 1000 MG CAPS Take 2,000 mg by mouth daily.    Historical Provider, MD   BP 189/103 mmHg  Pulse 51  Temp(Src) 97.4 F (36.3 C) (Oral)  Resp 21  Ht 5' (1.524 m)  Wt 153 lb (69.4 kg)  BMI 29.88 kg/m2  SpO2 94% Physical Exam  Constitutional: She is oriented to person, place, and time. She appears well-developed  and well-nourished.  HENT:  Head: Normocephalic and atraumatic.  Cardiovascular: Normal rate and regular rhythm.   No murmur heard. Pulmonary/Chest: Effort normal and breath sounds normal. No respiratory distress.  Abdominal: Soft. There is no tenderness. There is no rebound and no guarding.  Musculoskeletal: She exhibits no tenderness.  2 + pitting edema bilateral lower extremities  Neurological: She is alert and oriented to person, place, and time.  Skin: Skin is warm and dry.  Psychiatric: She has a normal mood and affect. Her behavior is normal.  Nursing note and vitals reviewed.   ED Course  Procedures (including critical care time) Labs Review Labs Reviewed  BASIC  METABOLIC PANEL - Abnormal; Notable for the following:    Glucose, Bld 114 (*)    Creatinine, Ser 1.59 (*)    GFR calc non Af Amer 32 (*)    GFR calc Af Amer 37 (*)    All other components within normal limits  CBC - Abnormal; Notable for the following:    RDW 17.4 (*)    All other components within normal limits  URINALYSIS, ROUTINE W REFLEX MICROSCOPIC (NOT AT [facility]) - Abnormal; Notable for the following:    APPearance CLOUDY (*)    Leukocytes, UA LARGE (*)    All other components within normal limits  BRAIN NATRIURETIC PEPTIDE - Abnormal; Notable for the following:    B Natriuretic Peptide 849.6 (*)    All other components within normal limits  URINE MICROSCOPIC-ADD ON - Abnormal; Notable for the following:    Squamous Epithelial / LPF FEW (*)    Bacteria, UA FEW (*)    All other components within normal limits  TROPONIN I  TROPONIN I  TSH  CBG MONITORING, ED  Randolm Idol, ED    Imaging Review Dg Chest 2 View  04/14/2015   CLINICAL DATA:  Short of breath.  Atrial fibrillation.  EXAM: CHEST  2 VIEW  COMPARISON:  Multiple priors dating back to 10/01/2011.  FINDINGS: Cardiopericardial silhouette upper limits of normal for projection. Calcified granuloma present in the RIGHT lung. There is no  airspace disease or pleural effusion. Cholecystectomy clips are present in the right upper quadrant. Chronic enlargement of the RIGHT pulmonary hilum. Scarring is present near the RIGHT costophrenic angle, unchanged from recent prior.  IMPRESSION: No interval change or acute cardiopulmonary disease. Calcified granuloma in the RIGHT mid lung has been stable since 2013.   Electronically Signed   By: Dereck Ligas M.D.   On: 04/14/2015 18:38   I have personally reviewed and evaluated these images and lab results as part of my medical decision-making.   EKG Interpretation   Date/Time:  Sunday April 14 2015 16:05:10 EDT Ventricular Rate:  51 PR Interval:  165 QRS Duration: 87 QT Interval:  531 QTC Calculation: 489 R Axis:   -15 Text Interpretation:  Sinus rhythm Borderline left axis deviation Abnormal  T, consider ischemia, diffuse leads Confirmed by Hazle Coca [phone]) on  04/14/2015 5:40:35 PM      MDM   Final diagnoses:  Dyspnea  Near syncope    Patient here for evaluation of near syncope, dyspnea on exertion. The patient does have some intermittent hypoxia in the emergency department, no respiratory distress. Patient does have a history of white coat hypertension. BMP with stable renal insufficiency. BNP has been rising over the last several months. EKG does have some ischemic changes. Concern that patient's blood pressure is contributing to her dyspnea on exertion and EKG changes. Recommend treating with antihypertensives and patient became very upset and declined. Attempted treating her hypertension with anxiolysis and there was no change in her blood pressure with vacation. Discussed with hospitalist regarding admission for further management.    Quintella Reichert, MD 04/15/15 [id number]

## 2015-04-14 NOTE — H&P (Signed)
[facility] History and Physical  Tiffany [name] [id number] DOB: 11-04-1945 DOA: 04/14/2015  Referring physician: Quintella Reichert, MD PCP: Olga Millers, MD   Chief Complaint: Fall.  HPI: Tiffany [name] is a 69 y.o. female with a PMH of hypertension, paroxysmal A. fib, status post cardioversion on 01/30/2015, mild LV dysfunction with an ejection fraction of 45-50% on echo, hyperlipidemia, hypothyroidism who comes to the ER with complaints of a fall and progressive shortness of breath for several days. Per patient earlier today in the afternoon, she went outside to feel the bird past about 50 steps per patient, then return back inside the house and was in the kitchen when she began to feel lightheaded, flushed and fell to the floor. Her husband witnessed this, and went to help her. She did not lose consciousness during this. After this, she started having diaphoresis, but denies chest pain, palpitations, nausea, PND, orthopnea or pitting edema of the lower extremities.  She subsequently came to the emergency department, where he was noticed that the patient had an elevated blood pressure with a systolic over [id number]. Per patient, she has a severe case of whitecoat hypertension and does not like to be in the hospital. She declined antihypertensive prescribed by the emergency department physician (Dr. Quintella Reichert, M.D.), but I agree to take lorazepam orally for anxiety. She is currently in no acute distress.   Review of Systems:  Constitutional:  Positive fatigue. No weight loss, night sweats, Fevers, chills,   HEENT:  No headaches, Difficulty swallowing,Tooth/dental problems,Sore throat,  No sneezing, itching, ear ache, nasal congestion, post nasal drip,  Cardio-vascular:  No chest pain, Orthopnea, PND, swelling in lower extremities, anasarca, dizziness, palpitations  GI:  No heartburn, indigestion, abdominal pain, nausea, vomiting, diarrhea, change in bowel habits, loss of  appetite  Resp:  Positive shortness of breath with exertion or at rest.  No excess mucus, no productive cough, No non-productive cough, No coughing up of blood.No change in color of mucus.No wheezing.No chest wall deformity  Skin:  no rash or lesions.  GU:  no dysuria, change in color of urine, no urgency or frequency. No flank pain.  Musculoskeletal:  Occasional myalgias of the shoulders and lower extremities. No joint pain or swelling. No decreased range of motion. No back pain.  Psych:  Positive anxiety due to dealing with health issues for the past few months. No change in mood or affect. No depression or . No memory loss.   Past Medical History  Diagnosis Date  . Obesity, unspecified   . URINARY INCONTINENCE 04-20-12    occ. with nighttime sleep pattern  . SYNCOPE, HX OF   . BENIGN POSITIONAL VERTIGO   . GERD   . DYSLIPIDEMIA   . Raynaud disease   . Complication of anesthesia 04-20-12    some issues with prolonged sedation after anesthesia  . Arthritis 04-20-12    Osteoarthritis-right hip  . HYPERTENSION     a. severe, pt intol of med tx, Pt. has severe "whitecoat" syndrome and refused medical therapy.  . Osteopenia   . Cataract   . Esophageal stricture   . Diverticulosis   . Hypothyroidism 09/29/2014    a. pt did not tolerate synthroid and this was subsequently discontinued.  . Atrial fibrillation status post cardioversion, 01/30/15 maintaining SR.  01/31/2015    a. 01/2015 s/p TEE/DCCV;  b. 02/06/2015 recurrent AF noted, amio added;  c. CHA2DS2VASc = 3-->eliquis.  . Mild LV dysfunction     a. 01/2015  Echo: EF 45-50%, mild MR, mildly dil LA, mod dil RV with mod to sev reduced fxn, mod-sev TR, PASP 6mmHg.   Past Surgical History  Procedure Laterality Date  . Nasal fracture surgery  2006  . Tubal ligation  1980  . Cholecystectomy      '90-"sludge"  . Total hip arthroplasty  04/26/2012    Procedure: TOTAL HIP ARTHROPLASTY ANTERIOR APPROACH;  Surgeon: Mauri Pole, MD;   Location: [facility];  Service: Orthopedics;  Laterality: Right;  . Breast ultrasound Right 09/13/13    There is no sonographic evidence of malignancy. the 123XX123 complicated cyst in (R) breast is consistent with a benign finding. repeat in 1 year  . Tee without cardioversion N/A 01/30/2015    Procedure: TRANSESOPHAGEAL ECHOCARDIOGRAM (TEE);  Surgeon: Sanda Klein, MD;  Location: [place];  Service: Cardiovascular;  Laterality: N/A;  . Cardioversion N/A 01/30/2015    Procedure: CARDIOVERSION;  Surgeon: Sanda Klein, MD;  Location: [facility];  Service: Cardiovascular;  Laterality: N/A;   Social History:  reports that she has never smoked. She has never used smokeless tobacco. She reports that she drinks about 1.2 oz of alcohol per week. She reports that she does not use illicit drugs.  Allergies  Allergen Reactions  . Levothyroxine Shortness Of Breath and Other (See Comments)    Exhaustion also  . Statins Other (See Comments)    "Pain, weakness and kidney problems"  . Penicillins Other (See Comments)    dry mouth    Family History  Problem Relation Age of Onset  . Adopted: Yes  . Other      patient was adopted     Prior to Admission medications   Medication Sig Start Date End Date Taking? Authorizing Provider  acetaminophen (TYLENOL) 325 MG tablet Take 2 tablets (650 mg total) by mouth every 4 (four) hours as needed for headache or mild pain. 01/29/15  Yes Erlene Quan, PA-C  amiodarone (PACERONE) 200 MG tablet Take 200mg  by mouthTwice daily for 2 weeks. Then take 200mg  daily Patient taking differently: Take 200 mg by mouth daily.  02/22/15  Yes Rogelia Mire, NP  Calcium Carb-Cholecalciferol (CALCIUM 600 + D PO) Take 1 tablet by mouth daily.   Yes Historical Provider, MD  diazepam (VALIUM) 5 MG tablet Take 1 tablet (5 mg total) by mouth every 12 (twelve) hours as needed for anxiety. for anxiety 11/22/14  Yes Rowe Clack, MD  ELIQUIS 5 MG TABS tablet TAKE 1 TABLET BY  MOUTH TWICE A DAY 03/25/15  Yes Minus Breeding, MD  estradiol (ESTRACE VAGINAL) 0.1 MG/GM vaginal cream Place 1 Applicatorful vaginally once a week. 1 gram once weekly Patient taking differently: Place 1 Applicatorful vaginally once a week. 1 gram once weekly on Sat or sun 01/29/15  Yes Luke K Kilroy, PA-C  famotidine (PEPCID AC) 10 MG tablet Take 1 tablet (10 mg total) by mouth daily. 01/29/15  Yes Luke K Kilroy, PA-C  fluticasone (FLONASE) 50 MCG/ACT nasal spray Place 1 spray into both nostrils daily as needed for allergies or rhinitis. 01/29/15  Yes Luke K Kilroy, PA-C  metoprolol (LOPRESSOR) 50 MG tablet Take 0.5 tablets (25 mg total) by mouth 2 (two) times daily. 01/31/15  Yes Isaiah Serge, NP  miconazole (MICOTIN) 2 % powder Apply topically as needed for itching. Patient taking differently: Apply 1 application topically daily.  09/26/14  Yes Rowe Clack, MD  Multiple Vitamins-Minerals (CENTRUM SILVER) tablet Take 1 tablet by mouth daily.  Yes Historical Provider, MD  Omega 3 1000 MG CAPS Take 2,000 mg by mouth daily.   Yes Historical Provider, MD  clindamycin (CLEOCIN) 150 MG capsule Use for dental procedures only 03/19/14   Historical Provider, MD   Physical Exam: Filed Vitals:   04/14/15 2200 04/14/15 2230 04/14/15 2343 04/14/15 2347  BP: 196/108 193/87 208/106 175/97  Pulse: 57  58 58  Temp:      TempSrc:      Resp: 22 16  18   Height:      Weight:      SpO2: 96% 93%  59%    Wt Readings from Last 3 Encounters:  04/14/15 69.4 kg (153 lb)  03/25/15 70.217 kg (154 lb 12.8 oz)  03/12/15 70.308 kg (155 lb)    General:  Appears calm and comfortable Eyes: PERRL, normal lids, irises & conjunctiva ENT: grossly normal hearing, lips & tongue Neck: no LAD, masses or thyromegaly Cardiovascular: RRR, no m/r/g. Trace LE edema. Telemetry: SR, no arrhythmias  Respiratory: CTA bilaterally, no w/r/r. Normal respiratory effort. Abdomen: soft, ntnd Skin: no rash or induration seen on  limited exam Musculoskeletal: grossly normal tone BUE/BLE Psychiatric: grossly normal mood and affect, speech fluent and appropriate Neurologic: grossly non-focal.          Labs on Admission:  Basic Metabolic Panel:  Recent Labs Lab 04/14/15 1615  NA 139  K 4.8  CL 108  CO2 23  GLUCOSE 114*  BUN 20  CREATININE 1.59*  CALCIUM 9.5    stat troponin, ED [id number] Collected: 04/14/15 1817    Updated: 04/14/15 1832    Specimen Type: Blood     Troponin i, poc 0.04      CBC:  Recent Labs Lab 04/14/15 1615  WBC 6.1  HGB 14.0  HCT 42.2  MCV 94.8  PLT 196    BNP (last 3 results)  Recent Labs  01/28/15 1241 02/08/15 1755 04/14/15 1749  BNP 678.4* 488.4* 849.6*    ProBNP (last 3 results)  Recent Labs  01/25/15 0831 02/08/15 1538 03/25/15 1623  PROBNP 940.0* 480.0* 618.0*    CBG:  Recent Labs Lab 04/14/15 1603  GLUCAP 83    Radiological Exams on Admission: Dg Chest 2 View  04/14/2015   CLINICAL DATA:  Short of breath.  Atrial fibrillation.  EXAM: CHEST  2 VIEW  COMPARISON:  Multiple priors dating back to 10/01/2011.  FINDINGS: Cardiopericardial silhouette upper limits of normal for projection. Calcified granuloma present in the RIGHT lung. There is no airspace disease or pleural effusion. Cholecystectomy clips are present in the right upper quadrant. Chronic enlargement of the RIGHT pulmonary hilum. Scarring is present near the RIGHT costophrenic angle, unchanged from recent prior.  IMPRESSION: No interval change or acute cardiopulmonary disease. Calcified granuloma in the RIGHT mid lung has been stable since 2013.   Electronically Signed   By: Dereck Ligas M.D.   On: 04/14/2015 18:38    Echocardiogram: 01/29/2015  LV EF: 45% -  50%  ------------------------------------------------------------------- Indications:   Atrial fibrillation - 427.31.  ------------------------------------------------------------------- History:  Risk  factors: Raynaud&'s disease. Dyslipidemia.  ------------------------------------------------------------------- Study Conclusions  - Left ventricle: Septal hypokinesis. The cavity size was normal. Wall thickness was normal. Systolic function was mildly reduced. The estimated ejection fraction was in the range of 45% to 50%. - Mitral valve: There was mild regurgitation. - Left atrium: The atrium was mildly dilated. - Right ventricle: The cavity size was moderately dilated. Systolic function was moderately to severely reduced. -  Right atrium: The atrium was mildly dilated. - Tricuspid valve: There was moderate-severe regurgitation. - Pulmonary arteries: PA peak pressure: 35 mm Hg (S).    EKG: Independently reviewed. Vent. rate 56 BPM PR interval 157 ms QRS duration 84 ms QT/QTc 490/473 ms P-R-T axes 56 -33 -74 Sinus rhythm Left axis deviation Abnormal T, consider ischemia, diffuse leads  Assessment/Plan Principal Problem:   Near syncope   Atrial fibrillation Admit for telemetry monitoring  Serial troponin levels. Continue Eliquis. Patient would like to see a different cardiologist while in the hospital, because she states that who has seen her before may not be diagnosing her health issues right or that some details may need to be addressed.  Active Problems:   Hyperlipidemia Continue diet measures and omega-3 fatty acids.    GERD Continue famotidine orally daily.    Anxiety Patient is very anxious about her current health situation. She is under the impression that may be an important diagnosis had not been made or details have been missed before. Lorazepam as needed while in the hospital.    Hypothyroidism Not on levothyroxine due to side effects. Since the patient is currently on amiodarone, I will go ahead and check a TSH level in the morning.    HTN (hypertension) Continue current antihypertensive therapy. The patient declined furosemide and hydralazine  that was ordered earlier in the emergency department. Will wait for lorazepam to work on anxiety, and see if this decreases the blood pressure, since the patient is convinced that her blood pressure at home is normal and only is elevated here because of her white coat hypertension.    Code Status: Full code. DVT Prophylaxis: On Elliquis.  Family Communication:  Disposition Plan: Admit for telemetry monitoring and syncope workup.   Time spent: Over 70 minutes were spent discussing the case with emergency department, reviewing results and medical records, in direct care of the patient and coordinating the admission process.  Reubin Milan, MD. [facility] Pager [phone].

## 2015-04-14 NOTE — ED Notes (Signed)
Discussed medications ordered, reviewed with the patient.

## 2015-04-14 NOTE — ED Notes (Signed)
Attempted to call report, rapid response to come see the patient.

## 2015-04-14 NOTE — ED Notes (Signed)
Dr. Olevia Bowens paged as patient requests home meds immediately after eating. Waiting in room for return page.

## 2015-04-14 NOTE — ED Notes (Signed)
Explained delay for blood pressure management prior to room assignment.

## 2015-04-14 NOTE — ED Notes (Signed)
hospitalist still at the bedside discussing possible plan of care.

## 2015-04-15 ENCOUNTER — Ambulatory Visit (HOSPITAL_BASED_OUTPATIENT_CLINIC_OR_DEPARTMENT_OTHER): Payer: Medicare Other

## 2015-04-15 DIAGNOSIS — R55 Syncope and collapse: Secondary | ICD-10-CM

## 2015-04-15 LAB — TROPONIN I
Troponin I: 0.04 ng/mL — ABNORMAL HIGH (ref <0.031)
Troponin I: 0.05 ng/mL — ABNORMAL HIGH (ref <0.031)
Troponin I: 0.04 ng/mL — ABNORMAL HIGH (ref <0.031)

## 2015-04-15 LAB — TSH: TSH: 6.53 u[IU]/mL — ABNORMAL HIGH (ref 0.350–4.500)

## 2015-04-15 LAB — T4, FREE: Free T4: 1.22 ng/dL — ABNORMAL HIGH (ref 0.61–1.12)

## 2015-04-15 MED ORDER — FLUTICASONE PROPIONATE 50 MCG/ACT NA SUSP: 1.0000 | Freq: Every day | NASAL | Status: DC | PRN

## 2015-04-15 MED ORDER — LORAZEPAM 1 MG PO TABS
1.0000 mg | ORAL_TABLET | ORAL | Status: DC | PRN
  Administered 2015-04-16: 1 mg via ORAL
  Filled 2015-04-15: qty 1

## 2015-04-15 MED ORDER — ADULT MULTIVITAMIN W/MINERALS CH
1.0000 | ORAL_TABLET | Freq: Every day | ORAL | Status: DC
  Administered 2015-04-15 – 2015-04-17 (×3): 1 via ORAL
  Filled 2015-04-15 (×3): qty 1

## 2015-04-15 MED ORDER — OMEGA-3-ACID ETHYL ESTERS 1 G PO CAPS
1000.0000 mg | ORAL_CAPSULE | Freq: Every day | ORAL | Status: DC
  Administered 2015-04-15 – 2015-04-17 (×3): 1000 mg via ORAL
  Filled 2015-04-15 (×3): qty 1

## 2015-04-15 MED ORDER — SODIUM CHLORIDE 0.9 % IJ SOLN
3.0000 mL | Freq: Two times a day (BID) | INTRAMUSCULAR | Status: DC
  Administered 2015-04-15 – 2015-04-17 (×6): 3 mL via INTRAVENOUS

## 2015-04-15 MED ORDER — FAMOTIDINE 20 MG PO TABS
10.0000 mg | ORAL_TABLET | Freq: Every day | ORAL | Status: DC
  Administered 2015-04-15 – 2015-04-17 (×3): 10 mg via ORAL
  Filled 2015-04-15 (×3): qty 1

## 2015-04-15 MED ORDER — CLONIDINE HCL 0.1 MG PO TABS
0.1000 mg | ORAL_TABLET | Freq: Once | ORAL | Status: AC
  Administered 2015-04-15: 0.1 mg via ORAL
  Filled 2015-04-15: qty 1

## 2015-04-15 MED ORDER — AMIODARONE HCL 200 MG PO TABS
200.0000 mg | ORAL_TABLET | Freq: Every day | ORAL | Status: DC
  Administered 2015-04-15 – 2015-04-17 (×3): 200 mg via ORAL
  Filled 2015-04-15 (×3): qty 1

## 2015-04-15 NOTE — Progress Notes (Signed)
VASCULAR LAB PRELIMINARY  PRELIMINARY  PRELIMINARY  PRELIMINARY  Carotid duplex completed.    Preliminary report:  Bilateral:  1-39% ICA stenosis.  Vertebral artery flow is antegrade.     Tiffany [name], RVS 04/15/2015, 2:29 PM

## 2015-04-15 NOTE — Evaluation (Signed)
Physical Therapy Evaluation Patient Details Name: Tiffany [name] MRN: [id number] DOB: 1946-06-12 Today's Date: 04/15/2015   History of Present Illness  Pt adm with near syncope at home. PMH - afib, THA  Clinical Impression  Pt presents to PT with good mobility but dyspnea when amb greater than 200'. SPO2 at 95% on RA with amb but dyspnea 3/4. Pt also reports leg fatigue and myalgias at this point as well. Pt reports significant lifestyle limitations over the past several months due to activity intolerance. No longer able to walk out to soccer field to watch grandchildren play or go on birding trips as she used to. Pt has tried to increase activity tolerance by increasing amb by walking laps at home but has seen no improvement. Currently no further PT needs.    Follow Up Recommendations No PT follow up    Equipment Recommendations  None recommended by PT    Recommendations for Other Services       Precautions / Restrictions Precautions Precautions: None      Mobility  Bed Mobility Overal bed mobility: Independent                Transfers Overall transfer level: Independent                  Ambulation/Gait Ambulation/Gait assistance: Modified independent (Device/Increase time) Ambulation Distance (Feet): 400 Feet Assistive device: None Gait Pattern/deviations: WFL(Within Functional Limits)   Gait velocity interpretation: at or above normal speed for age/gender General Gait Details: Pt becomes progressively more dyspneic with incr amb distance as well as myalgias in legs with incr distance. SaO2 was 95% on RA at end of amb with 3/4 dyspnea.  Stairs            Wheelchair Mobility    Modified Rankin (Stroke Patients Only)       Balance Overall balance assessment: No apparent balance deficits (not formally assessed)                                           Pertinent Vitals/Pain Pain Assessment: No/denies pain    Home Living  Family/patient expects to be discharged to:: Private residence Living Arrangements: Spouse/significant other Available Help at Discharge: Family Type of Home: House Home Access: Stairs to enter Entrance Stairs-Rails: None Entrance Stairs-Number of Steps: 2 Home Layout: Two level;Able to live on main level with bedroom/bathroom Home Equipment: Tub bench;Walker - 2 wheels      Prior Function Level of Independence: Independent               Hand Dominance        Extremity/Trunk Assessment   Upper Extremity Assessment: Overall WFL for tasks assessed           Lower Extremity Assessment: Overall WFL for tasks assessed         Communication   Communication: No difficulties  Cognition Arousal/Alertness: Awake/alert Behavior During Therapy: WFL for tasks assessed/performed Overall Cognitive Status: Within Functional Limits for tasks assessed                      General Comments      Exercises        Assessment/Plan    PT Assessment Patent does not need any further PT services  PT Diagnosis Difficulty walking   PT Problem List    PT Treatment Interventions  PT Goals (Current goals can be found in the Care Plan section) Acute Rehab PT Goals PT Goal Formulation: All assessment and education complete, DC therapy    Frequency     Barriers to discharge        Co-evaluation               End of Session   Activity Tolerance: Other (comment) (dyspnea) Patient left: in bed;with call bell/phone within reach;with family/visitor present Nurse Communication: Mobility status         Time: 1440-1500 PT Time Calculation (min) (ACUTE ONLY): 20 min   Charges:   PT Evaluation $Initial PT Evaluation Tier I: 1 Procedure     PT G Codes:        Morganne Haile 2015-05-04, 3:21 PM  [facility] PT [phone]

## 2015-04-15 NOTE — Care Management Note (Signed)
Case Management Note  Patient Details  Name: ABBEE [name] MRN: [id number] Date of Birth: Sep 19, 1945  Subjective/Objective:        NCM spoke with patient and spouse in the room,  Patient states she gets around pretty well and she does not think she will need HHPT.  Patient has insurance for medications and she has a pcp .  She has transportation at discharge.  NCM will cont to follow for dc needs.            Action/Plan:   Expected Discharge Date:                  Expected Discharge Plan:  [place]  In-House Referral:     Discharge planning Services  CM Consult  Post Acute Care Choice:    Choice offered to:     DME Arranged:    DME Agency:     HH Arranged:    [place] Agency:     Status of Service:  In process, will continue to follow  Medicare Important Message Given:    Date Medicare IM Given:    Medicare IM give by:    Date Additional Medicare IM Given:    Additional Medicare Important Message give by:     If discussed at [place] of Stay Meetings, dates discussed:    Additional Comments:  Tiffany Mayo, RN 04/15/2015, 12:21 PM

## 2015-04-15 NOTE — Progress Notes (Signed)
Report received from [place], [place] from ED. Awaiting pt's arrival.

## 2015-04-15 NOTE — ED Notes (Signed)
Attempted to call report. Left call back number.

## 2015-04-15 NOTE — Discharge Instructions (Addendum)

## 2015-04-15 NOTE — Progress Notes (Signed)
[facility] PROGRESS NOTE  Tiffany [name] [id number] DOB: [birth year] DOA: 04/14/2015 PCP: Olga Millers, MD  Assessment/Plan: 1. Near syncope: - unclear etiology. Monitor on telemetry for atleast 24 hours.  - carotid duplex does not show significant stenosis.  -  Mild elevation in troponins.  - but she denies any chest pain or sob.    2. afib rate controlled. : On amiodarone and metoprolol for rate control.  On eliquis for anticoagulation.    Hypertension: better controlled.   Code Status: full code.  Family Communication: family at bedside.  Disposition Plan: pending. Possible home tomorrow.    Consultants:  none  Procedures:  Carotid duplex.   Antibiotics:  none  HPI/Subjective: No new complaints.   Objective: Filed Vitals:   04/15/15 1452  BP: 140/93  Pulse:   Temp:   Resp: 16    Intake/Output Summary (Last 24 hours) at 04/15/15 1907 Last data filed at 04/15/15 1700  Gross per 24 hour  Intake    820 ml  Output   1451 ml  Net   -631 ml   Filed Weights   04/14/15 1554  Weight: 69.4 kg (153 lb)    Exam:   General:  Alert afebrile comfortable.   Cardiovascular: s1s2  Respiratory: ctab  Abdomen: soft non tender non distended bowel sounds heard  Musculoskeletal: no pedal edema.   Data Reviewed: Basic Metabolic Panel:  Recent Labs Lab 04/14/15 1615  NA 139  K 4.8  CL 108  CO2 23  GLUCOSE 114*  BUN 20  CREATININE 1.59*  CALCIUM 9.5   Liver Function Tests: No results for input(s): AST, ALT, ALKPHOS, BILITOT, PROT, ALBUMIN in the last 168 hours. No results for input(s): LIPASE, AMYLASE in the last 168 hours. No results for input(s): AMMONIA in the last 168 hours. CBC:  Recent Labs Lab 04/14/15 1615  WBC 6.1  HGB 14.0  HCT 42.2  MCV 94.8  PLT 196   Cardiac Enzymes:  Recent Labs Lab 04/15/15 0119 04/15/15 0646 04/15/15 1503  TROPONINI 0.04* 0.05* 0.04*   BNP (last 3 results)  Recent Labs  01/28/15 1241 02/08/15 1755 04/14/15 1749  BNP 678.4* 488.4* 849.6*    ProBNP (last 3 results)  Recent Labs  01/25/15 0831 02/08/15 1538 03/25/15 1623  PROBNP 940.0* 480.0* 618.0*    CBG:  Recent Labs Lab 04/14/15 1603  GLUCAP 83    No results found for this or any previous visit (from the past 240 hour(s)).   Studies: Dg Chest 2 View  04/14/2015   CLINICAL DATA:  Short of breath.  Atrial fibrillation.  EXAM: CHEST  2 VIEW  COMPARISON:  Multiple priors dating back to 10/01/2011.  FINDINGS: Cardiopericardial silhouette upper limits of normal for projection. Calcified granuloma present in the RIGHT lung. There is no airspace disease or pleural effusion. Cholecystectomy clips are present in the right upper quadrant. Chronic enlargement of the RIGHT pulmonary hilum. Scarring is present near the RIGHT costophrenic angle, unchanged from recent prior.  IMPRESSION: No interval change or acute cardiopulmonary disease. Calcified granuloma in the RIGHT mid lung has been stable since 2013.   Electronically Signed   By: Dereck Ligas M.D.   On: 04/14/2015 18:38    Scheduled Meds: . amiodarone  200 mg Oral Daily  . apixaban  5 mg Oral BID  . famotidine  10 mg Oral Daily  . furosemide  20 mg Intravenous Once  . hydrALAZINE  5 mg Intravenous Once  . metoprolol  25  mg Oral BID  . multivitamin with minerals  1 tablet Oral Daily  . omega-3 acid ethyl esters  1,000 mg Oral Daily  . sodium chloride  3 mL Intravenous Q12H   Continuous Infusions:   Principal Problem:   Near syncope Active Problems:   Hyperlipidemia   GERD   Anxiety   Hypothyroidism   HTN (hypertension)    Time spent: 25 min    Tiffany [name]  [facility] Pager [phone] If 7PM-7AM, please contact night-coverage at [email or web address], password [email or web address] 04/15/2015, 7:07 PM

## 2015-04-15 NOTE — Progress Notes (Signed)
PT Cancellation Note  Patient Details Name: DEMAYA JANOFF MRN: [id number] DOB: 1946/02/16   Cancelled Treatment:    Reason Eval/Treat Not Completed: Patient at procedure or test/unavailable. Will follow up at a later time.   Kiefer Opheim 04/15/2015, 1:47 PM [place]

## 2015-04-15 NOTE — Progress Notes (Signed)
Pt's blood pressure is 199/103 mm of hg. Pt remains asymptomatic. On-call provider Dr Olevia Bowens made aware. 0105 order received for 0.1 mg of po clonidine. Will monitor.

## 2015-04-15 NOTE — ED Notes (Signed)
Called bed control to request bed assignment as bp is decreasing.

## 2015-04-16 ENCOUNTER — Encounter (HOSPITAL_COMMUNITY): Payer: Self-pay | Admitting: Cardiology

## 2015-04-16 DIAGNOSIS — R55 Syncope and collapse: Secondary | ICD-10-CM | POA: Diagnosis not present

## 2015-04-16 DIAGNOSIS — I10 Essential (primary) hypertension: Secondary | ICD-10-CM | POA: Diagnosis not present

## 2015-04-16 LAB — T3, FREE: T3, Free: 2 pg/mL (ref 2.0–4.4)

## 2015-04-16 MED ORDER — HYDRALAZINE HCL 20 MG/ML IJ SOLN
5.0000 mg | Freq: Once | INTRAMUSCULAR | Status: AC
  Administered 2015-04-17: 5 mg via INTRAVENOUS

## 2015-04-16 NOTE — Consult Note (Signed)
CARDIOLOGY CONSULT NOTE  Patient ID: Tiffany [name] MRN: [id number] DOB/AGE: 71/08/1945 71 y.o.  Admit date: 04/14/2015 Primary Physician Olga Millers, MD Primary Cardiologist Dr. Percival Spanish Chief Complaint  Pre syncope  HPI:  The patient has a history of atrial fib.  She was treated with cardioversion with recurrence.  She later was started on amiodarone.   She has had a mildly reduced EF on echo in June (EF45%.)  She presented to the ED on 9/11 with DOE and presyncope.    When she presented to the ED her SBP was 200.  BNP was elevated a 849.  EKG demonstrated inferolateral T wave inversion unchanged from the previous EKG.  QT is prolonged.   Her troponin has been nondiagnostic and flat at 0.04 - 0.05.  She reports that she has been getting DOE since Feb.  It improved after she stopped thyroid replacement therapy.  However in May it got worse again.  She reports SOB walking about 300 feet.  She has had no chest pain and no PND, orthopnea or resting pain.  She has had no palpitations.  She does not describe orthostatic symptoms.  She reports that yesterday after she walked about 100 feet she felt very SOB and had presyncope.  She went down to the floor but did not have frank LOC.  Of note she has not been orthostatic as measured here.  Her sats were 95% with ambulation.    Past Medical History  Diagnosis Date  . URINARY INCONTINENCE 04-20-12    occ. with nighttime sleep pattern  . BENIGN POSITIONAL VERTIGO   . GERD   . DYSLIPIDEMIA   . Raynaud disease   . Complication of anesthesia 04-20-12    some issues with prolonged sedation after anesthesia  . Arthritis 04-20-12    Osteoarthritis-right hip  . HYPERTENSION     a. severe, pt intol of med tx, Pt. has severe "whitecoat" syndrome and refused medical therapy.  . Osteopenia   . Cataract   . Esophageal stricture   . Diverticulosis   . Hypothyroidism 09/29/2014    a. pt did not tolerate synthroid and this was subsequently  discontinued.  . Atrial fibrillation status post cardioversion, 01/30/15 maintaining SR.  01/31/2015    a. 01/2015 s/p TEE/DCCV;  b. 02/06/2015 recurrent AF noted, amio added;  c. CHA2DS2VASc = 3-->eliquis.  . Mild LV dysfunction     a. 01/2015 Echo: EF 45-50%, mild MR, mildly dil LA, mod dil RV with mod to sev reduced fxn, mod-sev TR, PASP 68mmHg.    Past Surgical History  Procedure Laterality Date  . Nasal fracture surgery  2006  . Tubal ligation  1980  . Cholecystectomy      '90-"sludge"  . Total hip arthroplasty  04/26/2012    Procedure: TOTAL HIP ARTHROPLASTY ANTERIOR APPROACH;  Surgeon: Mauri Pole, MD;  Location: [facility];  Service: Orthopedics;  Laterality: Right;  . Breast ultrasound Right 09/13/13    There is no sonographic evidence of malignancy. the [id number] complicated cyst in (R) breast is consistent with a benign finding. repeat in 1 year  . Tee without cardioversion N/A 01/30/2015    Procedure: TRANSESOPHAGEAL ECHOCARDIOGRAM (TEE);  Surgeon: Sanda Klein, MD;  Location: [place];  Service: Cardiovascular;  Laterality: N/A;  . Cardioversion N/A 01/30/2015    Procedure: CARDIOVERSION;  Surgeon: Sanda Klein, MD;  Location: [facility];  Service: Cardiovascular;  Laterality: N/A;    Allergies  Allergen Reactions  . Levothyroxine Shortness Of Breath  and Other (See Comments)    Exhaustion also  . Statins Other (See Comments)    "Pain, weakness and kidney problems"  . Penicillins Other (See Comments)    dry mouth   Prescriptions prior to admission  Medication Sig Dispense Refill Last Dose  . acetaminophen (TYLENOL) 325 MG tablet Take 2 tablets (650 mg total) by mouth every 4 (four) hours as needed for headache or mild pain.   Past Month at Unknown time  . amiodarone (PACERONE) 200 MG tablet Take 200mg  by mouthTwice daily for 2 weeks. Then take 200mg  daily (Patient taking differently: Take 200 mg by mouth daily. ) 60 tablet 5 04/14/2015 at Unknown time  . Calcium  Carb-Cholecalciferol (CALCIUM 600 + D PO) Take 1 tablet by mouth daily.   04/14/2015 at Unknown time  . diazepam (VALIUM) 5 MG tablet Take 1 tablet (5 mg total) by mouth every 12 (twelve) hours as needed for anxiety. for anxiety 40 tablet 0 Past Month at Unknown time  . ELIQUIS 5 MG TABS tablet TAKE 1 TABLET BY MOUTH TWICE A DAY 60 tablet 1 04/14/2015 at Unknown time  . estradiol (ESTRACE VAGINAL) 0.1 MG/GM vaginal cream Place 1 Applicatorful vaginally once a week. 1 gram once weekly (Patient taking differently: Place 1 Applicatorful vaginally once a week. 1 gram once weekly on Sat or sun) 42.5 g 1 04/13/2015 at Unknown time  . famotidine (PEPCID AC) 10 MG tablet Take 1 tablet (10 mg total) by mouth daily.   04/14/2015 at Unknown time  . fluticasone (FLONASE) 50 MCG/ACT nasal spray Place 1 spray into both nostrils daily as needed for allergies or rhinitis. 16 g 2 Past Month at Unknown time  . metoprolol (LOPRESSOR) 50 MG tablet Take 0.5 tablets (25 mg total) by mouth 2 (two) times daily. 60 tablet 11 04/14/2015 at 1100  . miconazole (MICOTIN) 2 % powder Apply topically as needed for itching. (Patient taking differently: Apply 1 application topically daily. ) 70 g 0 04/14/2015 at Unknown time  . Multiple Vitamins-Minerals (CENTRUM SILVER) tablet Take 1 tablet by mouth daily.    04/14/2015 at Unknown time  . Omega 3 1000 MG CAPS Take 2,000 mg by mouth daily.   04/14/2015 at Unknown time  . clindamycin (CLEOCIN) 150 MG capsule Use for dental procedures only   not used   Family History  Problem Relation Age of Onset  . Adopted: Yes  . Other      patient was adopted    Social History   Social History  . Marital Status: Married    Spouse Name: N/A  . Number of Children: 2  . Years of Education: N/A   Occupational History  . Not on file.   Social History Main Topics  . Smoking status: Never Smoker   . Smokeless tobacco: Never Used  . Alcohol Use: 1.2 oz/week    2 Glasses of wine per week      Comment: several glasses wine weekly  . Drug Use: No  . Sexual Activity: Yes   Other Topics Concern  . Not on file   Social History Narrative   She enjoys birding.  Lives at home with husband.   Married.   retired Quarry manager, Programme researcher, broadcasting/film/video and announcer     ROS:    As stated in the HPI and negative for all other systems.  Physical Exam: Blood pressure 169/95, pulse 59, temperature 97.6 F (36.4 C), temperature source Oral, resp. rate 18, height 5' (1.524 m),  weight 153 lb (69.4 kg), SpO2 92 %.  GENERAL:  Well appearing HEENT:  Pupils equal round and reactive, fundi not visualized, oral mucosa unremarkable NECK:  No jugular venous distention, waveform within normal limits, carotid upstroke brisk and symmetric, no bruits, no thyromegaly LYMPHATICS:  No cervical, inguinal adenopathy LUNGS:  Clear to auscultation bilaterally BACK:  No CVA tenderness CHEST:  Unremarkable HEART:  PMI not displaced or sustained,S1 and S2 within normal limits, no S3, no S4, no clicks, no rubs, no murmurs ABD:  Flat, positive bowel sounds normal in frequency in pitch, no bruits, no rebound, no guarding, no midline pulsatile mass, no hepatomegaly, no splenomegaly EXT:  2 plus pulses throughout, no edema, no cyanosis no clubbing SKIN:  No rashes no nodules NEURO:  Cranial nerves II through XII grossly intact, motor grossly intact throughout PSYCH:  Cognitively intact, oriented to person place and time  Labs: Lab Results  Component Value Date   BUN 20 04/14/2015   Lab Results  Component Value Date   CREATININE 1.59* 04/14/2015   Lab Results  Component Value Date   NA 139 04/14/2015   K 4.8 04/14/2015   CL 108 04/14/2015   CO2 23 04/14/2015   Lab Results  Component Value Date   TROPONINI 0.04* 04/15/2015   Lab Results  Component Value Date   WBC 6.1 04/14/2015   HGB 14.0 04/14/2015   HCT 42.2 04/14/2015   MCV 94.8 04/14/2015   PLT 196 04/14/2015   Lab Results  Component Value  Date   CHOL 237* 09/28/2014   HDL 59.00 09/28/2014   LDLCALC 148* 09/28/2014   TRIG 150.0* 09/28/2014   CHOLHDL 4 09/28/2014   Lab Results  Component Value Date   ALT 32 02/22/2015   AST 22 02/22/2015   ALKPHOS 80 02/22/2015   BILITOT 0.8 02/22/2015     Radiology:   CXR: No interval change or acute cardiopulmonary disease. Calcified granuloma in the RIGHT mid lung has been stable since 2013.  EKG:  NSR, rate 51, axis WNL, QT more prolonged than previous. Inferior and lateral T wave inversion unchanged from previous.  04/16/2015  ASSESSMENT AND PLAN:   NEAR SYNCOPE:  The etiology of this is most likely to be a low BP although bradycardia could be playing a role.  I will stop the beta blocker.  Telemetry shows no sustained pauses.  In the future if she continues to have this or SOB I will need to hold the amiodarone.    ATRIAL FIB:  She seems to be maintaining NSR.  Stop the beta blocker and continue Eliquis  HTN:   Home BP readings which I reviewed are NL.  She will continue the meds as above except for the beta blocker.     DOE:  I will start with a stress perfusion study.  I do not think that she needs a repeat echo at this time.  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  We could give her a low dose of home PO Lasix 10 mg.  I will possibly order PFTs (she had recent baseline before amiodarone) as an out patient.  I will likely need to stop the amiodarone in the future if she does not have improvement in her symptoms.    SignedMinus Breeding 04/16/2015, 2:54 PM

## 2015-04-16 NOTE — Progress Notes (Addendum)
[facility] PROGRESS NOTE  Tiffany [name] [id number] DOB: 1946/03/31 DOA: 04/14/2015 PCP: Tiffany Millers, MD   Pick up from [facility] 9/13  Assessment/Plan: 1. Near syncope: - unclear etiology, could be related to bradycardia -has intermittent episodes at home where she feels faint and has to lay down -EF is 45-50% with septal hypokinesis on 6/28  2. Dyspnea on exertion and poor exercise tolerance for 2 months -was very active prior to this -around the same time was diagnosed with Afib, failed DCCV x1, now on Amio and Metoprolol -i suspect could be related to meds/Amio/Metoprolol vs Could she have undiagnosed CAD, EKG with TWI in inferior and lat leads, Septal hypokinesis and low normal EF -will ask Cards to see -BNP trace elevated, but clinically do not appreciate volume overload, was taken off lasix few months ago due to AKI  3. P.Afib/Sinus bradycardia -in NSR, on metoprolol and amiodarone -continue eliquis  4. H/o hypothyroidism/TFT abnormalities -TSH slightly high but T4 high normal -would monitor and repeat in 52months, pt on amio  5. Severe White coat hypertension -BP stable now, continue metoprolol  6. CKD2 -stable  Code Status: full code.  Family Communication: spouse at bedside.  Disposition Plan: pending cards eval   Consultants:  none  Procedures:  Carotid duplex.   Antibiotics:  none  HPI/Subjective: No new complaints.   Objective: Filed Vitals:   04/16/15 1032  BP: 150/84  Pulse: 67  Temp:   Resp:     Intake/Output Summary (Last 24 hours) at 04/16/15 1139 Last data filed at 04/16/15 1034  Gross per 24 hour  Intake    963 ml  Output   1900 ml  Net   -937 ml   Filed Weights   04/14/15 1554  Weight: 69.4 kg (153 lb)    Exam:   General:  Alert afebrile comfortable.   Cardiovascular: s1s2  Respiratory: ctab  Abdomen: soft non tender non distended bowel sounds heard  Musculoskeletal: trace pedal edema.   Data  Reviewed: Basic Metabolic Panel:  Recent Labs Lab 04/14/15 1615  NA 139  K 4.8  CL 108  CO2 23  GLUCOSE 114*  BUN 20  CREATININE 1.59*  CALCIUM 9.5   Liver Function Tests: No results for input(s): AST, ALT, ALKPHOS, BILITOT, PROT, ALBUMIN in the last 168 hours. No results for input(s): LIPASE, AMYLASE in the last 168 hours. No results for input(s): AMMONIA in the last 168 hours. CBC:  Recent Labs Lab 04/14/15 1615  WBC 6.1  HGB 14.0  HCT 42.2  MCV 94.8  PLT 196   Cardiac Enzymes:  Recent Labs Lab 04/15/15 0119 04/15/15 0646 04/15/15 1503  TROPONINI 0.04* 0.05* 0.04*   BNP (last 3 results)  Recent Labs  01/28/15 1241 02/08/15 1755 04/14/15 1749  BNP 678.4* 488.4* 849.6*    ProBNP (last 3 results)  Recent Labs  01/25/15 0831 02/08/15 1538 03/25/15 1623  PROBNP 940.0* 480.0* 618.0*    CBG:  Recent Labs Lab 04/14/15 1603  GLUCAP 83    No results found for this or any previous visit (from the past 240 hour(s)).   Studies: Dg Chest 2 View  04/14/2015   CLINICAL DATA:  Short of breath.  Atrial fibrillation.  EXAM: CHEST  2 VIEW  COMPARISON:  Multiple priors dating back to 10/01/2011.  FINDINGS: Cardiopericardial silhouette upper limits of normal for projection. Calcified granuloma present in the RIGHT lung. There is no airspace disease or pleural effusion. Cholecystectomy clips are present in the right  upper quadrant. Chronic enlargement of the RIGHT pulmonary hilum. Scarring is present near the RIGHT costophrenic angle, unchanged from recent prior.  IMPRESSION: No interval change or acute cardiopulmonary disease. Calcified granuloma in the RIGHT mid lung has been stable since 2013.   Electronically Signed   By: Tiffany [name] M.D.   On: 04/14/2015 18:38    Scheduled Meds: . amiodarone  200 mg Oral Daily  . apixaban  5 mg Oral BID  . famotidine  10 mg Oral Daily  . furosemide  20 mg Intravenous Once  . hydrALAZINE  5 mg Intravenous Once  .  metoprolol  25 mg Oral BID  . multivitamin with minerals  1 tablet Oral Daily  . omega-3 acid ethyl esters  1,000 mg Oral Daily  . sodium chloride  3 mL Intravenous Q12H   Continuous Infusions:   Principal Problem:   Near syncope Active Problems:   Hyperlipidemia   GERD   Anxiety   Hypothyroidism   HTN (hypertension)    Time spent: 25 min    Tiffany [name]  [facility] Pager [phone] If 7PM-7AM, please contact night-coverage at [email or web address], password [facility] 04/16/2015, 11:39 AM

## 2015-04-16 NOTE — Progress Notes (Signed)
PT Note - Late G Code Entry    05/06/2015 1527  PT G-Codes **NOT FOR INPATIENT CLASS**  Functional Assessment Tool Used clinical judgement  Functional Limitation Mobility: Walking and moving around  Mobility: Walking and Moving Around Current Status [id number]) CI  Mobility: Walking and Moving Around Goal Status [phone]) CH  Mobility: Walking and Moving Around Discharge Status [phone]) CI   [facility] PT [phone]

## 2015-04-17 ENCOUNTER — Encounter (HOSPITAL_COMMUNITY): Payer: Medicare Other

## 2015-04-17 ENCOUNTER — Observation Stay (HOSPITAL_COMMUNITY): Payer: Medicare Other

## 2015-04-17 DIAGNOSIS — K219 Gastro-esophageal reflux disease without esophagitis: Secondary | ICD-10-CM | POA: Diagnosis not present

## 2015-04-17 DIAGNOSIS — R55 Syncope and collapse: Secondary | ICD-10-CM | POA: Diagnosis not present

## 2015-04-17 DIAGNOSIS — E785 Hyperlipidemia, unspecified: Secondary | ICD-10-CM

## 2015-04-17 DIAGNOSIS — I480 Paroxysmal atrial fibrillation: Secondary | ICD-10-CM | POA: Insufficient documentation

## 2015-04-17 DIAGNOSIS — R0602 Shortness of breath: Secondary | ICD-10-CM | POA: Diagnosis not present

## 2015-04-17 DIAGNOSIS — I10 Essential (primary) hypertension: Secondary | ICD-10-CM | POA: Diagnosis not present

## 2015-04-17 DIAGNOSIS — R001 Bradycardia, unspecified: Secondary | ICD-10-CM | POA: Insufficient documentation

## 2015-04-17 LAB — CBC
HCT: 39.8 % (ref 36.0–46.0)
Hemoglobin: 13.3 g/dL (ref 12.0–15.0)
MCH: 31.4 pg (ref 26.0–34.0)
MCHC: 33.4 g/dL (ref 30.0–36.0)
MCV: 94.1 fL (ref 78.0–100.0)
Platelets: 201 10*3/uL (ref 150–400)
RBC: 4.23 MIL/uL (ref 3.87–5.11)
RDW: 17.5 % — ABNORMAL HIGH (ref 11.5–15.5)
WBC: 6.2 10*3/uL (ref 4.0–10.5)

## 2015-04-17 LAB — NM MYOCAR MULTI W/SPECT W/WALL MOTION / EF
Estimated workload: 1 METS
Exercise duration (min): 0 min
Exercise duration (sec): 0 s
LV dias vol: 34 mL
LV sys vol: 3 mL
MPHR: 150 {beats}/min
Peak HR: 89 {beats}/min
Percent HR: 59 %
RATE: 0
Rest HR: 60 {beats}/min
SDS: 4
SRS: 9
SSS: 13
TID: 0.96

## 2015-04-17 LAB — BASIC METABOLIC PANEL
Anion gap: 7 (ref 5–15)
BUN: 17 mg/dL (ref 6–20)
CO2: 25 mmol/L (ref 22–32)
Calcium: 8.9 mg/dL (ref 8.9–10.3)
Chloride: 105 mmol/L (ref 101–111)
Creatinine, Ser: 1.72 mg/dL — ABNORMAL HIGH (ref 0.44–1.00)
GFR calc Af Amer: 34 mL/min — ABNORMAL LOW (ref >60)
GFR calc non Af Amer: 29 mL/min — ABNORMAL LOW (ref >60)
Glucose, Bld: 99 mg/dL (ref 65–99)
Potassium: 4 mmol/L (ref 3.5–5.1)
Sodium: 137 mmol/L (ref 135–145)

## 2015-04-17 MED ORDER — REGADENOSON 0.4 MG/5ML IV SOLN
0.4000 mg | Freq: Once | INTRAVENOUS | Status: AC
  Administered 2015-04-17: 0.4 mg via INTRAVENOUS
  Filled 2015-04-17: qty 5

## 2015-04-17 MED ORDER — TECHNETIUM TC 99M SESTAMIBI GENERIC - CARDIOLITE
10.0000 | Freq: Once | INTRAVENOUS | Status: AC | PRN
  Administered 2015-04-17: 10 via INTRAVENOUS

## 2015-04-17 MED ORDER — TECHNETIUM TC 99M SESTAMIBI GENERIC - CARDIOLITE
30.0000 | Freq: Once | INTRAVENOUS | Status: AC | PRN
Start: 2015-04-17 — End: 2015-04-17
  Administered 2015-04-17: 30 via INTRAVENOUS

## 2015-04-17 MED ORDER — REGADENOSON 0.4 MG/5ML IV SOLN
INTRAVENOUS | Status: AC
  Administered 2015-04-17: 0.4 mg via INTRAVENOUS
  Filled 2015-04-17: qty 5

## 2015-04-17 NOTE — Progress Notes (Signed)
Patient Name: Tiffany [name] Date of Encounter: 04/17/2015  Principal Problem:   Near syncope Active Problems:   Hyperlipidemia   GERD   Anxiety   Hypothyroidism   HTN (hypertension)     Primary Cardiologist: Dr. Percival Spanish  SUBJECTIVE: Still having shortness of breath overnight. Denies any chest pain or palpitations.  OBJECTIVE Filed Vitals:   04/16/15 2151 04/16/15 2300 04/17/15 0503 04/17/15 0900  BP: 184/84 181/94 131/78 174/96  Pulse: 58  67 62  Temp: 98 F (36.7 C)  98.1 F (36.7 C)   TempSrc: Oral  Oral   Resp: 18  18   Height:      Weight:      SpO2: 96%  95%     Intake/Output Summary (Last 24 hours) at 04/17/15 [id number] Last data filed at 04/16/15 2200  Gross per 24 hour  Intake    483 ml  Output   1600 ml  Net  -1117 ml   Filed Weights   04/14/15 1554  Weight: 153 lb (69.4 kg)    PHYSICAL EXAM General: Well developed, well nourished, female in no acute distress. Head: Normocephalic, atraumatic.  Neck: Supple without bruits, JVD not elevated. Lungs:  Resp regular and unlabored, CTA without wheezing or rales. Heart: RRR, S1, S2, no S3, S4, or murmur; no rub. Abdomen: Soft, non-tender, non-distended with normoactive bowel sounds. No hepatomegaly. No rebound/guarding. No obvious abdominal masses. Extremities: No clubbing, cyanosis, or edema. Distal pedal pulses are 2+ bilaterally. Neuro: Alert and oriented X 3. Moves all extremities spontaneously. Psych: Normal affect.   LABS: CBC: Recent Labs  04/14/15 1615 04/17/15 0023  WBC 6.1 6.2  HGB 14.0 13.3  HCT 42.2 39.8  MCV 94.8 94.1  PLT 196 [id number]   Basic Metabolic Panel: Recent Labs  04/14/15 1615 04/17/15 0023  NA 139 137  K 4.8 4.0  CL 108 105  CO2 23 25  GLUCOSE 114* 99  BUN 20 17  CREATININE 1.59* 1.72*  CALCIUM 9.5 8.9   Cardiac Enzymes: Recent Labs  04/15/15 0119 04/15/15 0646 04/15/15 1503  TROPONINI 0.04* 0.05* 0.04*    Recent Labs  04/14/15 1817  TROPIPOC 0.04     BNP:  B NATRIURETIC PEPTIDE  Date/Time Value Ref Range Status  04/14/2015 05:49 PM 849.6* 0.0 - 100.0 pg/mL Final  02/08/2015 05:55 PM 488.4* 0.0 - 100.0 pg/mL Final   Thyroid Function Tests: Recent Labs  04/15/15 0646 04/15/15 1505  TSH 6.530*  --   T3FREE  --  2.0    TELE: Sinus bradycardia with rate in 40's - 50's.       Current Medications:  . amiodarone  200 mg Oral Daily  . apixaban  5 mg Oral BID  . famotidine  10 mg Oral Daily  . furosemide  20 mg Intravenous Once  . hydrALAZINE  5 mg Intravenous Once  . multivitamin with minerals  1 tablet Oral Daily  . omega-3 acid ethyl esters  1,000 mg Oral Daily  . sodium chloride  3 mL Intravenous Q12H      ASSESSMENT AND PLAN:  1. Near syncope - etiology thought to be related to hypotension and/or bradycardia - HR remained low in 40-50's overnight.   2. Atrial Fibrillation - Maintaining NSR.  - BB discontinued in setting of bradycardia. Continue Amiodarone and Eliquis.  3. HTN - BP has been 131/78 - 188/96. Had previously been on BB but this was stopped due to bradycardia. - Reports feeling "very anxious" this  morning in anticipation of having her nuclear stress test. - Consider addition of Amlodipine in helping better control her BP.  4. Dyspnea on Exertion - 1 day Lexiscan performed this morning. Results are pending from [facility]iology. - reports mild improvement in her breathing since admission.  - Echo in 01/2015 showed EF of 45-50% so her symptoms could potentially be secondary to acute on chronic systolic dysfunction. Does not appear volume overloaded on exam. - currently receiving Lasix 20mg  IV once daily.   Arna Medici , PA-C 9:52 AM 04/17/2015 Pager: [phone]

## 2015-04-17 NOTE — Consult Note (Signed)
[place] EdD 

## 2015-04-17 NOTE — Progress Notes (Signed)
  Tiffany [name] presented for 1-day Lexiscan this morning. No complications during or immediately following the procedure.  [facility]iology will be reading the study. Final results are pending.  Signed, Erma Heritage, PA-C 04/17/2015, 10:30 AM Pager: [phone]

## 2015-04-17 NOTE — Discharge Summary (Signed)
Physician Discharge Summary  Tiffany [name] [id number] DOB: 04-Jan-1946 DOA: 04/14/2015  PCP: Olga Millers, MD  Admit date: 04/14/2015 Discharge date: 04/17/2015  Time spent: 20 minutes  Recommendations for Outpatient Follow-up:  1. Follow up with PCP in 1-2 weeks 2. Follow up with Dr. Percival Spanish as scheduled on 10/17 3. Please repeat TSH in 1-2 months  Discharge Diagnoses:  Principal Problem:   Near syncope Active Problems:   Hyperlipidemia   GERD   Anxiety   Hypothyroidism   HTN (hypertension)   Paroxysmal atrial fibrillation   Bradycardia   Discharge Condition: Stable  Diet recommendation: Heart healthy  Filed Weights   04/14/15 1554  Weight: 69.4 kg (153 lb)    History of present illness:  Please see admit h and p from 9/11 for details. Briefly, pt presents with fall and near syncope in the setting of paroxysmal afib. The patient was admitted for further work up.  Hospital Course:  1. Near syncope: -unclear etiology, could be related to bradycardia -EF is 45-50% with septal hypokinesis on 6/28 -Remained stable this admission -Case discussed with Cardiology. Pt is cleared for discharge home today per Cardiology  2. Dyspnea on exertion and poor exercise tolerance for 2 months -was very active prior to this -around the same time was diagnosed with Afib, failed DCCV x1, and had been on Amio and Metoprolol prior to admit -Suspect secondary to meds/Amio/Metoprolol.  EKG with TWI in inferior and lat leads, Septal hypokinesis and low normal EF -Cardiology consulted and pt underwent stress which was found to be unremarkable -BNP was mildly elevated, but clinically did not appreciate volume overload, was taken off lasix few months ago due to AKI  3. P.Afib/Sinus bradycardia -Pt remained in NSR, initially on metoprolol and amiodarone -Cardiology recommended stopping metoprolol. Pt reportedly declined addition of norvasc for BP control -continued eliquis  4. H/o  hypothyroidism/TFT abnormalities -TSH slightly high but T4 high normal -would monitor and repeat in 12months, pt on amio  5. Severe White coat hypertension -BP stable now, continue metoprolol  6. CKD2 -stable  Procedures:  Stress test 9/14 - normal  Consultations:  Cardiology  Discharge Exam: Filed Vitals:   04/17/15 1004 04/17/15 1100 04/17/15 1130 04/17/15 1405  BP: 161/81  124/83 146/82  Pulse: 79 61 65 68  Temp:   97.5 F (36.4 C) 97.7 F (36.5 C)  TempSrc:   Oral Oral  Resp:   16 16  Height:      Weight:      SpO2:   99%     General: Awake, in nad Cardiovascular: regular, s1, s2 Respiratory: normal resp effort, no wheezing  Discharge Instructions     Medication List    STOP taking these medications        clindamycin 150 MG capsule  Commonly known as:  CLEOCIN     diazepam 5 MG tablet  Commonly known as:  VALIUM     metoprolol 50 MG tablet  Commonly known as:  LOPRESSOR      TAKE these medications        acetaminophen 325 MG tablet  Commonly known as:  TYLENOL  Take 2 tablets (650 mg total) by mouth every 4 (four) hours as needed for headache or mild pain.     amiodarone 200 MG tablet  Commonly known as:  PACERONE  Take 200mg  by mouthTwice daily for 2 weeks. Then take 200mg  daily     CALCIUM 600 + D PO  Take 1 tablet by  mouth daily.     CENTRUM SILVER tablet  Take 1 tablet by mouth daily.     ELIQUIS 5 MG Tabs tablet  Generic drug:  apixaban  TAKE 1 TABLET BY MOUTH TWICE A DAY     estradiol 0.1 MG/GM vaginal cream  Commonly known as:  ESTRACE VAGINAL  Place 1 Applicatorful vaginally once a week. 1 gram once weekly     famotidine 10 MG tablet  Commonly known as:  PEPCID AC  Take 1 tablet (10 mg total) by mouth daily.     fluticasone 50 MCG/ACT nasal spray  Commonly known as:  FLONASE  Place 1 spray into both nostrils daily as needed for allergies or rhinitis.     miconazole 2 % powder  Commonly known as:  MICOTIN  Apply  topically as needed for itching.     Omega 3 1000 MG Caps  Take 2,000 mg by mouth daily.       Allergies  Allergen Reactions  . Levothyroxine Shortness Of Breath and Other (See Comments)    Exhaustion also  . Statins Other (See Comments)    "Pain, weakness and kidney problems"  . Penicillins Other (See Comments)    dry mouth   Follow-up Information    Follow up with Olga Millers, MD. Schedule an appointment as soon as possible for a visit in 1 week.   Specialty:  Internal Medicine   Why:  Hospital follow up   Contact information:   [place] [phone]        The results of significant diagnostics from this hospitalization (including imaging, microbiology, ancillary and laboratory) are listed below for reference.    Significant Diagnostic Studies: Dg Chest 2 View  04/14/2015   CLINICAL DATA:  Short of breath.  Atrial fibrillation.  EXAM: CHEST  2 VIEW  COMPARISON:  Multiple priors dating back to 10/01/2011.  FINDINGS: Cardiopericardial silhouette upper limits of normal for projection. Calcified granuloma present in the RIGHT lung. There is no airspace disease or pleural effusion. Cholecystectomy clips are present in the right upper quadrant. Chronic enlargement of the RIGHT pulmonary hilum. Scarring is present near the RIGHT costophrenic angle, unchanged from recent prior.  IMPRESSION: No interval change or acute cardiopulmonary disease. Calcified granuloma in the RIGHT mid lung has been stable since 2013.   Electronically Signed   By: Dereck Ligas M.D.   On: 04/14/2015 18:38   Nm Myocar Multi W/spect W/wall Motion / Ef  04/17/2015   CLINICAL DATA:  Shortness of breath.  Syncope, collapse.  EXAM: MYOCARDIAL IMAGING WITH SPECT (REST AND PHARMACOLOGIC-STRESS)  GATED LEFT VENTRICULAR WALL MOTION STUDY  LEFT VENTRICULAR EJECTION FRACTION  TECHNIQUE: Standard myocardial SPECT imaging was performed after resting intravenous injection of 10 mCi  Tc-28m sestamibi. Subsequently, intravenous infusion of Lexiscan was performed under the supervision of the Cardiology staff. At peak effect of the drug, 30 mCi Tc-53m sestamibi was injected intravenously and standard myocardial SPECT imaging was performed. Quantitative gated imaging was also performed to evaluate left ventricular wall motion, and estimate left ventricular ejection fraction.  COMPARISON:  None.  FINDINGS: Perfusion: No decreased activity in the left ventricle on stress imaging to suggest reversible ischemia or infarction.  Wall Motion: Normal left ventricular wall motion. No left ventricular dilation.  Left Ventricular Ejection Fraction: 90 %  End diastolic volume 34 ml  End systolic volume 3 ml  IMPRESSION: 1. No reversible ischemia or infarction.  2. Normal left ventricular wall motion.  3. Left ventricular ejection fraction 90%  4. Low-risk stress test findings*.  *2012 Appropriate Use Criteria for Coronary Revascularization Focused Update: J Am Coll Cardiol. N6492421. http://content.airportbarriers.com.aspx?articleid=1201161   Electronically Signed   By: Rolm Baptise M.D.   On: 04/17/2015 12:11    Microbiology: No results found for this or any previous visit (from the past 240 hour(s)).   Labs: Basic Metabolic Panel:  Recent Labs Lab 04/14/15 1615 04/17/15 0023  NA 139 137  K 4.8 4.0  CL 108 105  CO2 23 25  GLUCOSE 114* 99  BUN 20 17  CREATININE 1.59* 1.72*  CALCIUM 9.5 8.9   Liver Function Tests: No results for input(s): AST, ALT, ALKPHOS, BILITOT, PROT, ALBUMIN in the last 168 hours. No results for input(s): LIPASE, AMYLASE in the last 168 hours. No results for input(s): AMMONIA in the last 168 hours. CBC:  Recent Labs Lab 04/14/15 1615 04/17/15 0023  WBC 6.1 6.2  HGB 14.0 13.3  HCT 42.2 39.8  MCV 94.8 94.1  PLT 196 201   Cardiac Enzymes:  Recent Labs Lab 04/15/15 0119 04/15/15 0646 04/15/15 1503  TROPONINI 0.04* 0.05* 0.04*    BNP: BNP (last 3 results)  Recent Labs  01/28/15 1241 02/08/15 1755 04/14/15 1749  BNP 678.4* 488.4* 849.6*    ProBNP (last 3 results)  Recent Labs  01/25/15 0831 02/08/15 1538 03/25/15 1623  PROBNP 940.0* 480.0* 618.0*    CBG:  Recent Labs Lab 04/14/15 1603  GLUCAP 83    Signed:  CHIU, STEPHEN K  [facility] 04/17/2015, 4:31 PM

## 2015-04-17 NOTE — Progress Notes (Signed)
PIV removed.  Discharge reviewed with pt.  Pt taken to discharge location via wheelchair.

## 2015-04-19 ENCOUNTER — Telehealth: Payer: Self-pay | Admitting: *Deleted

## 2015-04-19 NOTE — Telephone Encounter (Signed)
Transition Care Management Follow-up Telephone Call   Date discharged? 04/17/15   How have you been since you were released from the hospital? Pt states she is feeling much better   Do you understand why you were in the hospital? YES   Do you understand the discharge instructions? YES   Where were you discharged to? Home   Items Reviewed:  Medications reviewed: YES  Allergies reviewed: YES  Dietary changes reviewed: YES  Referrals reviewed: no referral needed   Functional Questionnaire:   Activities of Daily Living (ADLs):   She states she are independent in the following: ambulation, bathing and hygiene, feeding, continence, grooming, toileting and dressing States she doesn't require assistance   Any transportation issues/concerns?: YES   Any patient concerns? NO   Confirmed importance and date/time of follow-up visits scheduled YES, appt 04/29/15  Provider Appointment booked with Dr. Doug Sou  Confirmed with patient if condition begins to worsen call PCP or go to the ER.  Patient was given the office number and encouraged to call back with question or concerns.  : YES

## 2015-04-29 ENCOUNTER — Encounter: Payer: Self-pay | Admitting: Internal Medicine

## 2015-04-29 ENCOUNTER — Other Ambulatory Visit (INDEPENDENT_AMBULATORY_CARE_PROVIDER_SITE_OTHER): Payer: Medicare Other

## 2015-04-29 ENCOUNTER — Ambulatory Visit (INDEPENDENT_AMBULATORY_CARE_PROVIDER_SITE_OTHER): Payer: Medicare Other | Admitting: Internal Medicine

## 2015-04-29 VITALS — BP 162/94 | HR 99 | Temp 98.1°F | Resp 18 | Ht 60.0 in | Wt 156.1 lb

## 2015-04-29 DIAGNOSIS — N183 Chronic kidney disease, stage 3 (moderate): Secondary | ICD-10-CM | POA: Diagnosis not present

## 2015-04-29 DIAGNOSIS — R55 Syncope and collapse: Secondary | ICD-10-CM

## 2015-04-29 DIAGNOSIS — N179 Acute kidney failure, unspecified: Secondary | ICD-10-CM

## 2015-04-29 DIAGNOSIS — I4891 Unspecified atrial fibrillation: Secondary | ICD-10-CM

## 2015-04-29 DIAGNOSIS — Z09 Encounter for follow-up examination after completed treatment for conditions other than malignant neoplasm: Secondary | ICD-10-CM | POA: Diagnosis not present

## 2015-04-29 DIAGNOSIS — R001 Bradycardia, unspecified: Secondary | ICD-10-CM | POA: Diagnosis not present

## 2015-04-29 LAB — BASIC METABOLIC PANEL
BUN: 19 mg/dL (ref 6–23)
CO2: 24 mEq/L (ref 19–32)
Calcium: 9.3 mg/dL (ref 8.4–10.5)
Chloride: 107 mEq/L (ref 96–112)
Creatinine, Ser: 1.45 mg/dL — ABNORMAL HIGH (ref 0.40–1.20)
GFR: 37.84 mL/min — ABNORMAL LOW (ref >60.00)
Glucose, Bld: 70 mg/dL (ref 70–99)
Potassium: 4.5 mEq/L (ref 3.5–5.1)
Sodium: 139 mEq/L (ref 135–145)

## 2015-04-29 MED ORDER — DIAZEPAM 5 MG PO TABS: 5.0000 mg | ORAL_TABLET | Freq: Three times a day (TID) | ORAL | Status: DC | PRN

## 2015-04-29 NOTE — Progress Notes (Signed)
   Subjective:    Patient ID: Tiffany [name], female    DOB: 06/20/1946, 69 y.o.   MRN: [id number]  HPI The patient is a 69 YO female here for hospital follow up (seen for near syncope and had stress test which was negative, HR in the 40s and her metoprolol has been stopped). She is doing about the same since being home. Still poor tolerance and some SOB. Better appetite and moving around the house more. Has continued off her metoprolol and has felt like she is in normal sinus. No fevers, chills, cough, cold symptoms.   Review of Systems  Constitutional: Positive for activity change and fatigue. Negative for chills, appetite change and unexpected weight change.  HENT: Negative for congestion, ear pain, postnasal drip, rhinorrhea, sinus pressure and sore throat.   Eyes: Negative.   Respiratory: Positive for shortness of breath. Negative for cough, chest tightness and wheezing.   Cardiovascular: Negative for chest pain, palpitations and leg swelling.  Gastrointestinal: Negative for nausea, abdominal pain, diarrhea, constipation and abdominal distention.  Endocrine: Negative for cold intolerance and heat intolerance.  Musculoskeletal: Negative.   Skin: Negative.   Neurological: Negative for dizziness, seizures, syncope, speech difficulty, weakness, light-headedness and headaches.      Objective:   Physical Exam  Constitutional: She is oriented to person, place, and time. She appears well-developed and well-nourished.  HENT:  Head: Normocephalic and atraumatic.  Eyes: EOM are normal.  Neck: Normal range of motion.  Cardiovascular: Normal rate and regular rhythm.   Appears regular  Pulmonary/Chest: Effort normal and breath sounds normal.  Abdominal: Soft. She exhibits no distension. There is no tenderness. There is no rebound.  Musculoskeletal: She exhibits no edema.  Neurological: She is alert and oriented to person, place, and time. Coordination normal.  Skin: Skin is warm and dry.    Psychiatric: She has a normal mood and affect.   Filed Vitals:   04/29/15 0932  BP: 162/94  Pulse: 99  Temp: 98.1 F (36.7 C)  TempSrc: Oral  Resp: 18  Height: 5' (1.524 m)  Weight: 156 lb 1.9 oz (70.816 kg)  SpO2: 91%      Assessment & Plan:

## 2015-04-29 NOTE — Assessment & Plan Note (Signed)
Still in sinus on exam today, following with cardiology. Goes back to see them in several weeks.

## 2015-04-29 NOTE — Assessment & Plan Note (Signed)
Appears to be in CKD stage 3 at this time. Creatinine up and down in the hospital and she is now off all diuretics. Checking BMP today.

## 2015-04-29 NOTE — Assessment & Plan Note (Signed)
HR in 90s off metoprolol and appears to be in regular rhythm on the amiodarone. Would recommend if she needs beta blocker in the future to start with very low dose. Stress test negative in the hospital.

## 2015-04-29 NOTE — Progress Notes (Signed)
Pre visit review using our clinic review tool, if applicable. No additional management support is needed unless otherwise documented below in the visit note. 

## 2015-04-29 NOTE — Patient Instructions (Signed)
We will recheck the blood work today and call you back about the results.   We have given you the refill of the diazepam and the temporary handicapped parking sticker.   We will follow the notes from the cardiologist to see if they think that the poor energy levels are coming from the A fib or the amiodarone.

## 2015-05-20 ENCOUNTER — Encounter: Payer: Self-pay | Admitting: Cardiology

## 2015-05-20 ENCOUNTER — Ambulatory Visit (INDEPENDENT_AMBULATORY_CARE_PROVIDER_SITE_OTHER): Payer: Medicare Other | Admitting: Cardiology

## 2015-05-20 VITALS — BP 210/130 | HR 98 | Ht 60.0 in | Wt 158.6 lb

## 2015-05-20 DIAGNOSIS — I480 Paroxysmal atrial fibrillation: Secondary | ICD-10-CM

## 2015-05-20 DIAGNOSIS — I4891 Unspecified atrial fibrillation: Secondary | ICD-10-CM | POA: Diagnosis not present

## 2015-05-20 DIAGNOSIS — I10 Essential (primary) hypertension: Secondary | ICD-10-CM | POA: Diagnosis not present

## 2015-05-20 NOTE — Patient Instructions (Signed)
Your physician wants you to follow-up in: 3 Months. You will receive a reminder letter in the mail two months in advance. If you don't receive a letter, please call our office to schedule the follow-up appointment.  Your physician has recommended you make the following change in your medication: STOP Amiodarone  If you need a refill on your cardiac medications before your next appointment, please call your pharmacy.

## 2015-05-20 NOTE — Progress Notes (Signed)
Cardiology Office Note   Date:  05/21/2015   ID:  Tiffany [name], DOB 09-Jan-1946, MRN [id number]  PCP:  [name], MD  Cardiologist:   Minus Breeding, MD   Chief Complaint  Patient presents with  . Shortness of Breath      History of Present Illness: Tiffany [name] is a 69 y.o. female who presents for follow up after a hospitalization for near syncope. This was thought to be related to bradycardia. Her beta blocker was discontinued. She's had continued dyspnea however. She says she short of breath when she does such things as try to clean the bathroom or walk. She did have a stress perfusion study in the hospital and this was negative for any evidence of ischemia. She's had a mildly reduced ejection fraction and a mildly increased BNP but they did not seem volume overloaded. She returns for follow-up she is disgusted because she's not doing well. She's not had any chest pressure, neck or arm discomfort. She's had no weight gain or edema. She still had some dizziness. Her biggest issue is decreased exercise tolerance.  Past Medical History  Diagnosis Date  . URINARY INCONTINENCE 04-20-12    occ. with nighttime sleep pattern  . BENIGN POSITIONAL VERTIGO   . GERD   . DYSLIPIDEMIA   . Raynaud disease   . Complication of anesthesia 04-20-12    some issues with prolonged sedation after anesthesia  . Arthritis 04-20-12    Osteoarthritis-right hip  . HYPERTENSION     a. severe, pt intol of med tx, Pt. has severe "whitecoat" syndrome and refused medical therapy.  . Osteopenia   . Cataract   . Esophageal stricture   . Diverticulosis   . Hypothyroidism 09/29/2014    a. pt did not tolerate synthroid and this was subsequently discontinued.  . Atrial fibrillation status post cardioversion, 01/30/15 maintaining SR.  01/31/2015    a. 01/2015 s/p TEE/DCCV;  b. 02/06/2015 recurrent AF noted, amio added;  c. CHA2DS2VASc = 3-->eliquis.  . Mild LV dysfunction     a. 01/2015 Echo: EF 45-50%,  mild MR, mildly dil LA, mod dil RV with mod to sev reduced fxn, mod-sev TR, PASP 74mmHg.    Past Surgical History  Procedure Laterality Date  . Nasal fracture surgery  2006  . Tubal ligation  1980  . Cholecystectomy      '90-"sludge"  . Total hip arthroplasty  04/26/2012    Procedure: TOTAL HIP ARTHROPLASTY ANTERIOR APPROACH;  Surgeon: Mauri Pole, MD;  Location: [facility];  Service: Orthopedics;  Laterality: Right;  . Breast ultrasound Right 09/13/13    There is no sonographic evidence of malignancy. the [id number] complicated cyst in (R) breast is consistent with a benign finding. repeat in 1 year  . Tee without cardioversion N/A 01/30/2015    Procedure: TRANSESOPHAGEAL ECHOCARDIOGRAM (TEE);  Surgeon: Sanda Klein, MD;  Location: [facility];  Service: Cardiovascular;  Laterality: N/A;  . Cardioversion N/A 01/30/2015    Procedure: CARDIOVERSION;  Surgeon: Sanda Klein, MD;  Location: [facility];  Service: Cardiovascular;  Laterality: N/A;     Current Outpatient Prescriptions  Medication Sig Dispense Refill  . acetaminophen (TYLENOL) 325 MG tablet Take 2 tablets (650 mg total) by mouth every 4 (four) hours as needed for headache or mild pain.    . Calcium Carb-Cholecalciferol (CALCIUM 600 + D PO) Take 1 tablet by mouth daily.    . diazepam (VALIUM) 5 MG tablet Take 1 tablet (5 mg total)  by mouth every 8 (eight) hours as needed for anxiety. 30 tablet 0  . ELIQUIS 5 MG TABS tablet TAKE 1 TABLET BY MOUTH TWICE A DAY 60 tablet 1  . estradiol (ESTRACE VAGINAL) 0.1 MG/GM vaginal cream Place 1 Applicatorful vaginally once a week. 1 gram once weekly (Patient taking differently: Place 1 Applicatorful vaginally once a week. 1 gram once weekly on Sat or sun) 42.5 g 1  . famotidine (PEPCID AC) 10 MG tablet Take 1 tablet (10 mg total) by mouth daily.    . fluticasone (FLONASE) 50 MCG/ACT nasal spray Place 1 spray into both nostrils daily as needed for allergies or rhinitis. 16 g 2  . miconazole  (MICOTIN) 2 % powder Apply topically as needed for itching. (Patient taking differently: Apply 1 application topically daily. ) 70 g 0  . Multiple Vitamins-Minerals (CENTRUM SILVER) tablet Take 1 tablet by mouth daily.     . Omega 3 1000 MG CAPS Take 2,000 mg by mouth daily.     No current facility-administered medications for this visit.    Allergies:   Levothyroxine; Statins; and Penicillins   ROS:  Please see the history of present illness.   Otherwise, review of systems are positive for none.   All other systems are reviewed and negative.    PHYSICAL EXAM: VS:  BP 210/130 mmHg  Pulse 98  Ht 5' (1.524 m)  Wt 158 lb 9.6 oz (71.94 kg)  BMI 30.97 kg/m2 , BMI Body mass index is 30.97 kg/(m^2). GENERAL:  Well appearing HEENT:  Pupils equal round and reactive, fundi not visualized, oral mucosa unremarkable NECK:  No jugular venous distention, waveform within normal limits, carotid upstroke brisk and symmetric, no bruits, no thyromegaly LYMPHATICS:  No cervical, inguinal adenopathy LUNGS:  Clear to auscultation bilaterally BACK:  No CVA tenderness CHEST:  Unremarkable HEART:  PMI not displaced or sustained,S1 and S2 within normal limits, no S3, no S4, no clicks, no rubs, no murmurs ABD:  Flat, positive bowel sounds normal in frequency in pitch, no bruits, no rebound, no guarding, no midline pulsatile mass, no hepatomegaly, no splenomegaly EXT:  2 plus pulses throughout, no edema, no cyanosis no clubbing SKIN:  No rashes no nodules NEURO:  Cranial nerves II through XII grossly intact, motor grossly intact throughout PSYCH:  Cognitively intact, oriented to person place and time    EKG:  EKG is not ordered today.   Recent Labs: 02/06/2015: Magnesium 2.1 02/22/2015: ALT 32 03/25/2015: Pro B Natriuretic peptide (BNP) 618.0* 04/14/2015: B Natriuretic Peptide 849.6* 04/15/2015: TSH 6.530* 04/17/2015: Hemoglobin 13.3; Platelets 201 04/29/2015: BUN 19; Creatinine, Ser 1.45*; Potassium 4.5;  Sodium 139    Lipid Panel    Component Value Date/Time   CHOL 237* 09/28/2014 0855   TRIG 150.0* 09/28/2014 0855   TRIG 185 02/11/2009   HDL 59.00 09/28/2014 0855   CHOLHDL 4 09/28/2014 0855   VLDL 30.0 09/28/2014 0855   LDLCALC 148* 09/28/2014 0855      Wt Readings from Last 3 Encounters:  05/20/15 158 lb 9.6 oz (71.94 kg)  04/29/15 156 lb 1.9 oz (70.816 kg)  04/14/15 153 lb (69.4 kg)      Other studies Reviewed: Additional studies/ records that were reviewed today include: Hospital records. Review of the above records demonstrates:  Please see elsewhere in the note.     ASSESSMENT AND PLAN:  DYSPNEA:  This could be related to amiodarone.  I did review recent PFTs which did not exclude COPD.  However, she  does not want a pulmonary work up at this time.  She wants to come off of some of her meds.  I think that it is reasonable for her to stop the amiodarone.  If she goes back into atrial fib she will need to go to the ED and might at that point need Tikosyn.  HTN:  Her SBP was 180 when repeated.  She is insistent that she has severe white coat HTN and she refuses up titration of her meds.  I did review the BP diary and her BP was controlled.  She will continue meds as listed and check her BP at home after she takes her meds.  She will let me know if it is still running high.     Current medicines are reviewed at length with the patient today.  The patient does not have concerns regarding medicines.  The following changes have been made:  no change  Labs/ tests ordered today include:  No orders of the defined types were placed in this encounter.    Disposition:   FU with me in 4 months.    Signed, Minus Breeding, MD  05/21/2015 9:19 AM    [facility]

## 2015-07-03 ENCOUNTER — Other Ambulatory Visit: Payer: Self-pay | Admitting: Cardiology

## 2015-07-15 ENCOUNTER — Ambulatory Visit (INDEPENDENT_AMBULATORY_CARE_PROVIDER_SITE_OTHER): Payer: Medicare Other | Admitting: Internal Medicine

## 2015-07-15 ENCOUNTER — Encounter: Payer: Self-pay | Admitting: Internal Medicine

## 2015-07-15 ENCOUNTER — Other Ambulatory Visit (INDEPENDENT_AMBULATORY_CARE_PROVIDER_SITE_OTHER): Payer: Medicare Other

## 2015-07-15 VITALS — BP 156/110 | HR 80 | Temp 98.3°F | Resp 20 | Ht 60.0 in | Wt 159.0 lb

## 2015-07-15 DIAGNOSIS — R0600 Dyspnea, unspecified: Secondary | ICD-10-CM

## 2015-07-15 DIAGNOSIS — E038 Other specified hypothyroidism: Secondary | ICD-10-CM | POA: Diagnosis not present

## 2015-07-15 DIAGNOSIS — N182 Chronic kidney disease, stage 2 (mild): Secondary | ICD-10-CM

## 2015-07-15 DIAGNOSIS — N189 Chronic kidney disease, unspecified: Secondary | ICD-10-CM | POA: Diagnosis not present

## 2015-07-15 DIAGNOSIS — I4891 Unspecified atrial fibrillation: Secondary | ICD-10-CM

## 2015-07-15 LAB — COMPREHENSIVE METABOLIC PANEL
ALT: 36 U/L — ABNORMAL HIGH (ref 0–35)
AST: 26 U/L (ref 0–37)
Albumin: 3.8 g/dL (ref 3.5–5.2)
Alkaline Phosphatase: 85 U/L (ref 39–117)
BUN: 29 mg/dL — ABNORMAL HIGH (ref 6–23)
CO2: 28 mEq/L (ref 19–32)
Calcium: 9.5 mg/dL (ref 8.4–10.5)
Chloride: 106 mEq/L (ref 96–112)
Creatinine, Ser: 1.81 mg/dL — ABNORMAL HIGH (ref 0.40–1.20)
GFR: 29.28 mL/min — ABNORMAL LOW (ref >60.00)
Glucose, Bld: 88 mg/dL (ref 70–99)
Potassium: 4 mEq/L (ref 3.5–5.1)
Sodium: 142 mEq/L (ref 135–145)
Total Bilirubin: 0.8 mg/dL (ref 0.2–1.2)
Total Protein: 6.6 g/dL (ref 6.0–8.3)

## 2015-07-15 LAB — CBC
HCT: 45.9 % (ref 36.0–46.0)
Hemoglobin: 15.1 g/dL — ABNORMAL HIGH (ref 12.0–15.0)
MCHC: 33 g/dL (ref 30.0–36.0)
MCV: 94.3 fl (ref 78.0–100.0)
Platelets: 216 10*3/uL (ref 150.0–400.0)
RBC: 4.87 Mil/uL (ref 3.87–5.11)
RDW: 15.2 % (ref 11.5–15.5)
WBC: 6.6 10*3/uL (ref 4.0–10.5)

## 2015-07-15 LAB — TSH: TSH: 7.81 u[IU]/mL — ABNORMAL HIGH (ref 0.35–4.50)

## 2015-07-15 LAB — T4, FREE: Free T4: 1.01 ng/dL (ref 0.60–1.60)

## 2015-07-15 LAB — BRAIN NATRIURETIC PEPTIDE: Pro B Natriuretic peptide (BNP): 1187 pg/mL — ABNORMAL HIGH (ref 0.0–100.0)

## 2015-07-15 LAB — CK: Total CK: 52 U/L (ref 7–177)

## 2015-07-15 NOTE — Progress Notes (Signed)
Pre visit review using our clinic review tool, if applicable. No additional management support is needed unless otherwise documented below in the visit note. 

## 2015-07-15 NOTE — Patient Instructions (Signed)
We are checking lab work today and will call you back with the results.   You can think about what you want the next steps to be. The options would be working with cardiopulmonary rehab to build back up the endurance and muscles and the other option for more answers about this would be a lung specialist.   There is not a great explanation for the breathing problems. We are checking for the fluid levels and some rheumatologic processes which could explain the symptoms.

## 2015-07-15 NOTE — Assessment & Plan Note (Signed)
Recent PFTs not significantly abnormal. No history of smoking or environmental exposure. Recent stress test low risk. Recent bout of A fib with decreased EF (45-50%) on echo. Unclear if this was related to the A fib or is true finding. She does have history of raynaud's so will check for some rheumatologic cause as well. Have advised her to consider cardiopulmonary rehab for supervised exercise so we can see her tolerance. Not winded while talking to me today. Suspect that anxiety is playing some role in this as well. Does not need any inhalers today. Also offered pulmonary referral and she did not want to do that.

## 2015-07-15 NOTE — Assessment & Plan Note (Signed)
She is now off amiodarone for about 2 months. Not feeling any better. Checking BNP today and CBC and CMP today and adjust as needed.

## 2015-07-15 NOTE — Assessment & Plan Note (Signed)
Checking TSH and free T4 today, off medicine.

## 2015-07-15 NOTE — Assessment & Plan Note (Signed)
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 BP is high today but she does not want to adjust therapy due to it being normal at home. Checking CMP today and adjust therapy as needed. Taking lasix only prn.

## 2015-07-15 NOTE — Progress Notes (Signed)
   Subjective:    Patient ID: Tiffany [name], female    DOB: 02/29/1946, 69 y.o.   MRN: [id number]  HPI The patient is a 69 YO female coming in with multiple concerns. She is having SOB and not much exercise tolerance. She feels that this is going on since she was treated for her A fib. She does feel like she is deteriorating. She was trying to walk around the house some but now her SOB is limiting her severely. She has also noticed that her weights are fluctuating more and she is taking lasix more often. When her weight is up she has more SOB. She is not satisfied with her cardiologist and they did stop amiodarone at last visit. She has not noticed a difference. She gets winded going up a flight of stairs. She is able to lie flat at night time.  Her next concern is her memory. She feels that she has problems remembering words sometimes and this worries her a lot. She does not feel that she is able to remember them but her husband adds that most of the time they come back after some amount of time. She is not having problems recognizing people or getting lost.   Review of Systems  Constitutional: Positive for activity change and fatigue. Negative for chills, appetite change and unexpected weight change.  HENT: Negative for congestion, ear pain, postnasal drip, rhinorrhea, sinus pressure and sore throat.   Eyes: Negative.   Respiratory: Positive for shortness of breath. Negative for cough, chest tightness and wheezing.   Cardiovascular: Negative for chest pain, palpitations and leg swelling.  Gastrointestinal: Negative for nausea, abdominal pain, diarrhea, constipation and abdominal distention.  Endocrine: Negative for cold intolerance and heat intolerance.  Musculoskeletal: Negative.   Skin: Negative.   Neurological: Negative for dizziness, seizures, syncope, speech difficulty, weakness, light-headedness and headaches.      Objective:   Physical Exam  Constitutional: She is oriented to person,  place, and time. She appears well-developed and well-nourished.  HENT:  Head: Normocephalic and atraumatic.  Eyes: EOM are normal.  Neck: Normal range of motion.  Cardiovascular: Normal rate and regular rhythm.   Appears regular  Pulmonary/Chest: Effort normal and breath sounds normal.  Abdominal: Soft. She exhibits no distension. There is no tenderness. There is no rebound.  Musculoskeletal: She exhibits no edema.  Neurological: She is alert and oriented to person, place, and time. Coordination normal.  Skin: Skin is warm and dry.  Psychiatric: She has a normal mood and affect.   Filed Vitals:   07/15/15 1004  BP: 156/110  Pulse: 80  Temp: 98.3 F (36.8 C)  TempSrc: Oral  Resp: 20  Height: 5' (1.524 m)  Weight: 159 lb (72.122 kg)      Assessment & Plan:

## 2015-07-16 LAB — ANTI-NUCLEAR AB-TITER (ANA TITER): ANA Titer 1: 1:640 {titer} — ABNORMAL HIGH

## 2015-07-16 LAB — ANA: Anti Nuclear Antibody(ANA): POSITIVE — AB

## 2015-07-17 ENCOUNTER — Other Ambulatory Visit: Payer: Self-pay | Admitting: Internal Medicine

## 2015-07-17 DIAGNOSIS — R768 Other specified abnormal immunological findings in serum: Secondary | ICD-10-CM

## 2015-07-18 ENCOUNTER — Encounter: Payer: Self-pay | Admitting: Internal Medicine

## 2015-07-23 ENCOUNTER — Telehealth: Payer: Self-pay | Admitting: *Deleted

## 2015-07-23 DIAGNOSIS — N183 Chronic kidney disease, stage 3 unspecified: Secondary | ICD-10-CM

## 2015-07-23 NOTE — Telephone Encounter (Signed)
Left msg on triage stating md wanting her to call back to let her know how she was doing. Pt states she took the lasix Thurs, Fri and Sat the stop. This morning her weight was up againb so she took a lasix this am. Have an appt tomorrow w/Dr. Jolee Ewing...Tiffany [name]

## 2015-07-24 ENCOUNTER — Ambulatory Visit: Payer: Medicare Other | Admitting: Internal Medicine

## 2015-07-24 DIAGNOSIS — R5382 Chronic fatigue, unspecified: Secondary | ICD-10-CM | POA: Diagnosis not present

## 2015-07-24 DIAGNOSIS — E039 Hypothyroidism, unspecified: Secondary | ICD-10-CM | POA: Diagnosis not present

## 2015-07-24 DIAGNOSIS — N189 Chronic kidney disease, unspecified: Secondary | ICD-10-CM | POA: Diagnosis not present

## 2015-07-24 DIAGNOSIS — I4891 Unspecified atrial fibrillation: Secondary | ICD-10-CM | POA: Diagnosis not present

## 2015-07-24 DIAGNOSIS — R760 Raised antibody titer: Secondary | ICD-10-CM | POA: Diagnosis not present

## 2015-07-24 DIAGNOSIS — I7300 Raynaud's syndrome without gangrene: Secondary | ICD-10-CM | POA: Diagnosis not present

## 2015-07-24 NOTE — Telephone Encounter (Signed)
Would like her to continue taking lasix daily and come back for labs either Friday this week or Tuesday next week.

## 2015-07-24 NOTE — Telephone Encounter (Signed)
Called pt husband stating she wasn't available but can take msg for her. Inform him of md response,,,,/lmb

## 2015-07-26 ENCOUNTER — Other Ambulatory Visit (INDEPENDENT_AMBULATORY_CARE_PROVIDER_SITE_OTHER): Payer: Medicare Other

## 2015-07-26 DIAGNOSIS — N183 Chronic kidney disease, stage 3 unspecified: Secondary | ICD-10-CM

## 2015-07-26 LAB — BASIC METABOLIC PANEL
BUN: 31 mg/dL — ABNORMAL HIGH (ref 6–23)
CO2: 27 mEq/L (ref 19–32)
Calcium: 9.4 mg/dL (ref 8.4–10.5)
Chloride: 105 mEq/L (ref 96–112)
Creatinine, Ser: 1.79 mg/dL — ABNORMAL HIGH (ref 0.40–1.20)
GFR: 29.65 mL/min — ABNORMAL LOW (ref >60.00)
Glucose, Bld: 74 mg/dL (ref 70–99)
Potassium: 4.5 mEq/L (ref 3.5–5.1)
Sodium: 141 mEq/L (ref 135–145)

## 2015-07-31 ENCOUNTER — Ambulatory Visit: Payer: Medicare Other | Admitting: Internal Medicine

## 2015-08-08 ENCOUNTER — Encounter (HOSPITAL_COMMUNITY): Payer: Self-pay | Admitting: Neurology

## 2015-08-08 ENCOUNTER — Emergency Department (HOSPITAL_COMMUNITY): Payer: Medicare Other

## 2015-08-08 ENCOUNTER — Inpatient Hospital Stay (HOSPITAL_COMMUNITY)
Admission: EM | Admit: 2015-08-08 | Discharge: 2015-08-09 | DRG: 389 | Disposition: A | Discharge status: Home / Self Care | Payer: Medicare Other | Attending: Internal Medicine | Admitting: Internal Medicine

## 2015-08-08 DIAGNOSIS — K565 Intestinal adhesions [bands] with obstruction (postprocedural) (postinfection): Secondary | ICD-10-CM | POA: Diagnosis present

## 2015-08-08 DIAGNOSIS — E039 Hypothyroidism, unspecified: Secondary | ICD-10-CM | POA: Diagnosis present

## 2015-08-08 DIAGNOSIS — Z96641 Presence of right artificial hip joint: Secondary | ICD-10-CM | POA: Diagnosis present

## 2015-08-08 DIAGNOSIS — Z6831 Body mass index (BMI) 31.0-31.9, adult: Secondary | ICD-10-CM

## 2015-08-08 DIAGNOSIS — I10 Essential (primary) hypertension: Secondary | ICD-10-CM | POA: Diagnosis present

## 2015-08-08 DIAGNOSIS — I129 Hypertensive chronic kidney disease with stage 1 through stage 4 chronic kidney disease, or unspecified chronic kidney disease: Secondary | ICD-10-CM | POA: Diagnosis present

## 2015-08-08 DIAGNOSIS — I4891 Unspecified atrial fibrillation: Secondary | ICD-10-CM | POA: Diagnosis present

## 2015-08-08 DIAGNOSIS — Z7901 Long term (current) use of anticoagulants: Secondary | ICD-10-CM | POA: Diagnosis not present

## 2015-08-08 DIAGNOSIS — R1030 Lower abdominal pain, unspecified: Secondary | ICD-10-CM | POA: Diagnosis present

## 2015-08-08 DIAGNOSIS — E785 Hyperlipidemia, unspecified: Secondary | ICD-10-CM | POA: Diagnosis present

## 2015-08-08 DIAGNOSIS — F419 Anxiety disorder, unspecified: Secondary | ICD-10-CM | POA: Diagnosis present

## 2015-08-08 DIAGNOSIS — R0602 Shortness of breath: Secondary | ICD-10-CM | POA: Diagnosis present

## 2015-08-08 DIAGNOSIS — E669 Obesity, unspecified: Secondary | ICD-10-CM | POA: Diagnosis present

## 2015-08-08 DIAGNOSIS — K210 Gastro-esophageal reflux disease with esophagitis: Secondary | ICD-10-CM | POA: Diagnosis not present

## 2015-08-08 DIAGNOSIS — H269 Unspecified cataract: Secondary | ICD-10-CM | POA: Diagnosis present

## 2015-08-08 DIAGNOSIS — I7300 Raynaud's syndrome without gangrene: Secondary | ICD-10-CM | POA: Diagnosis present

## 2015-08-08 DIAGNOSIS — Z79899 Other long term (current) drug therapy: Secondary | ICD-10-CM

## 2015-08-08 DIAGNOSIS — I480 Paroxysmal atrial fibrillation: Secondary | ICD-10-CM | POA: Diagnosis present

## 2015-08-08 DIAGNOSIS — K5730 Diverticulosis of large intestine without perforation or abscess without bleeding: Secondary | ICD-10-CM | POA: Diagnosis present

## 2015-08-08 DIAGNOSIS — K56600 Partial intestinal obstruction, unspecified as to cause: Secondary | ICD-10-CM | POA: Diagnosis present

## 2015-08-08 DIAGNOSIS — N184 Chronic kidney disease, stage 4 (severe): Secondary | ICD-10-CM | POA: Diagnosis present

## 2015-08-08 DIAGNOSIS — I071 Rheumatic tricuspid insufficiency: Secondary | ICD-10-CM | POA: Diagnosis present

## 2015-08-08 DIAGNOSIS — K219 Gastro-esophageal reflux disease without esophagitis: Secondary | ICD-10-CM | POA: Diagnosis present

## 2015-08-08 DIAGNOSIS — K5669 Other intestinal obstruction: Secondary | ICD-10-CM | POA: Diagnosis not present

## 2015-08-08 DIAGNOSIS — K56609 Unspecified intestinal obstruction, unspecified as to partial versus complete obstruction: Secondary | ICD-10-CM | POA: Diagnosis present

## 2015-08-08 LAB — COMPREHENSIVE METABOLIC PANEL
ALT: 30 U/L (ref 14–54)
AST: 27 U/L (ref 15–41)
Albumin: 3.1 g/dL — ABNORMAL LOW (ref 3.5–5.0)
Alkaline Phosphatase: 71 U/L (ref 38–126)
Anion gap: 10 (ref 5–15)
BUN: 25 mg/dL — ABNORMAL HIGH (ref 6–20)
CO2: 21 mmol/L — ABNORMAL LOW (ref 22–32)
Calcium: 9.1 mg/dL (ref 8.9–10.3)
Chloride: 109 mmol/L (ref 101–111)
Creatinine, Ser: 1.91 mg/dL — ABNORMAL HIGH (ref 0.44–1.00)
GFR calc Af Amer: 29 mL/min — ABNORMAL LOW (ref >60)
GFR calc non Af Amer: 25 mL/min — ABNORMAL LOW (ref >60)
Glucose, Bld: 117 mg/dL — ABNORMAL HIGH (ref 65–99)
Potassium: 4.4 mmol/L (ref 3.5–5.1)
Sodium: 140 mmol/L (ref 135–145)
Total Bilirubin: 0.9 mg/dL (ref 0.3–1.2)
Total Protein: 6.3 g/dL — ABNORMAL LOW (ref 6.5–8.1)

## 2015-08-08 LAB — LACTIC ACID, PLASMA
Lactic Acid, Venous: 2.1 mmol/L (ref 0.5–2.0)
Lactic Acid, Venous: 1.8 mmol/L (ref 0.5–2.0)

## 2015-08-08 LAB — URINALYSIS, ROUTINE W REFLEX MICROSCOPIC
Bilirubin Urine: NEGATIVE
Glucose, UA: NEGATIVE mg/dL
Hgb urine dipstick: NEGATIVE
Ketones, ur: 15 mg/dL — AB
Nitrite: NEGATIVE
Protein, ur: NEGATIVE mg/dL
Specific Gravity, Urine: 1.011 (ref 1.005–1.030)
pH: 6 (ref 5.0–8.0)

## 2015-08-08 LAB — APTT
aPTT: 30 seconds (ref 24–37)
aPTT: 33 seconds (ref 24–37)

## 2015-08-08 LAB — CBC
HCT: 44.3 % (ref 36.0–46.0)
Hemoglobin: 14.2 g/dL (ref 12.0–15.0)
MCH: 31 pg (ref 26.0–34.0)
MCHC: 32.1 g/dL (ref 30.0–36.0)
MCV: 96.7 fL (ref 78.0–100.0)
Platelets: 194 10*3/uL (ref 150–400)
RBC: 4.58 MIL/uL (ref 3.87–5.11)
RDW: 15.8 % — ABNORMAL HIGH (ref 11.5–15.5)
WBC: 6.4 10*3/uL (ref 4.0–10.5)

## 2015-08-08 LAB — LIPASE, BLOOD: Lipase: 36 U/L (ref 11–51)

## 2015-08-08 LAB — MAGNESIUM: Magnesium: 1.9 mg/dL (ref 1.7–2.4)

## 2015-08-08 LAB — URINE MICROSCOPIC-ADD ON: RBC / HPF: NONE SEEN RBC/hpf (ref 0–5)

## 2015-08-08 LAB — HEPARIN LEVEL (UNFRACTIONATED): Heparin Unfractionated: 1.14 IU/mL — ABNORMAL HIGH (ref 0.30–0.70)

## 2015-08-08 MED ORDER — LORAZEPAM 2 MG/ML IJ SOLN: 0.5000 mg | Freq: Four times a day (QID) | INTRAMUSCULAR | Status: DC | PRN

## 2015-08-08 MED ORDER — SODIUM CHLORIDE 0.9 % IJ SOLN
3.0000 mL | Freq: Two times a day (BID) | INTRAMUSCULAR | Status: DC
  Administered 2015-08-09: 3 mL via INTRAVENOUS

## 2015-08-08 MED ORDER — ACETAMINOPHEN 650 MG RE SUPP: 650.0000 mg | Freq: Four times a day (QID) | RECTAL | Status: DC | PRN

## 2015-08-08 MED ORDER — FENTANYL CITRATE (PF) 100 MCG/2ML IJ SOLN
100.0000 ug | Freq: Once | INTRAMUSCULAR | Status: AC
  Administered 2015-08-08: 100 ug via INTRAVENOUS
  Filled 2015-08-08: qty 2

## 2015-08-08 MED ORDER — IOHEXOL 300 MG/ML  SOLN
25.0000 mL | INTRAMUSCULAR | Status: AC
  Administered 2015-08-08: 25 mL via ORAL

## 2015-08-08 MED ORDER — MORPHINE SULFATE (PF) 2 MG/ML IV SOLN: 1.0000 mg | INTRAVENOUS | Status: DC | PRN

## 2015-08-08 MED ORDER — DEXTROSE-NACL 5-0.9 % IV SOLN
INTRAVENOUS | Status: DC
  Administered 2015-08-08: 15:00:00 via INTRAVENOUS

## 2015-08-08 MED ORDER — ONDANSETRON HCL 4 MG/2ML IJ SOLN: 4.0000 mg | Freq: Four times a day (QID) | INTRAMUSCULAR | Status: DC | PRN

## 2015-08-08 MED ORDER — SODIUM CHLORIDE 0.9 % IV BOLUS (SEPSIS)
1000.0000 mL | Freq: Once | INTRAVENOUS | Status: AC
  Administered 2015-08-08: 1000 mL via INTRAVENOUS

## 2015-08-08 MED ORDER — ONDANSETRON HCL 4 MG PO TABS: 4.0000 mg | ORAL_TABLET | Freq: Four times a day (QID) | ORAL | Status: DC | PRN

## 2015-08-08 MED ORDER — FENTANYL CITRATE (PF) 100 MCG/2ML IJ SOLN
50.0000 ug | Freq: Once | INTRAMUSCULAR | Status: AC
  Administered 2015-08-08: 50 ug via INTRAVENOUS
  Filled 2015-08-08: qty 2

## 2015-08-08 MED ORDER — ACETAMINOPHEN 325 MG PO TABS: 650.0000 mg | ORAL_TABLET | Freq: Four times a day (QID) | ORAL | Status: DC | PRN

## 2015-08-08 MED ORDER — HEPARIN (PORCINE) IN NACL 100-0.45 UNIT/ML-% IJ SOLN
750.0000 [IU]/h | INTRAMUSCULAR | Status: DC
  Administered 2015-08-08: 900 [IU]/h via INTRAVENOUS
  Filled 2015-08-08: qty 250

## 2015-08-08 MED ORDER — PANTOPRAZOLE SODIUM 40 MG IV SOLR
40.0000 mg | INTRAVENOUS | Status: DC
  Administered 2015-08-08: 40 mg via INTRAVENOUS
  Filled 2015-08-08: qty 40

## 2015-08-08 NOTE — ED Notes (Signed)
Pt finished drinking oral CT contrast. CT informed.

## 2015-08-08 NOTE — Progress Notes (Signed)
ANTICOAGULATION CONSULT NOTE - Initial Consult  Pharmacy Consult for heparin Indication: atrial fibrillation  Allergies  Allergen Reactions  . Levothyroxine Shortness Of Breath and Other (See Comments)    Exhaustion also  . Statins Other (See Comments)    "Pain, weakness and kidney problems"  . Penicillins Other (See Comments)    dry mouth Has patient had a PCN reaction causing immediate rash, facial/tongue/throat swelling, SOB or lightheadedness with hypotension: No Has patient had a PCN reaction causing severe rash involving mucus membranes or skin necrosis: No Has patient had a PCN reaction that required hospitalization No Has patient had a PCN reaction occurring within the last 10 years: No If all of the above answers are "NO", then may proceed with Cephalosporin use.     Patient Measurements:   Heparin Dosing Weight: 61.3kg  Vital Signs: Temp: 97.4 F (36.3 C) (01/05 0711) Temp Source: Oral (01/05 0711) BP: 157/108 mmHg (01/05 1215) Pulse Rate: 60 (01/05 1215)  Labs:  Recent Labs  08/08/15 0700  HGB 14.2  HCT 44.3  PLT 194  CREATININE 1.91*    CrCl cannot be calculated (Unknown ideal weight.).   Medical History: Past Medical History  Diagnosis Date  . URINARY INCONTINENCE 04-20-12    occ. with nighttime sleep pattern  . BENIGN POSITIONAL VERTIGO   . GERD   . DYSLIPIDEMIA   . Raynaud disease   . Complication of anesthesia 04-20-12    some issues with prolonged sedation after anesthesia  . Arthritis 04-20-12    Osteoarthritis-right hip  . HYPERTENSION     a. severe, pt intol of med tx, Pt. has severe "whitecoat" syndrome and refused medical therapy.  . Osteopenia   . Cataract   . Esophageal stricture   . Diverticulosis   . Hypothyroidism 09/29/2014    a. pt did not tolerate synthroid and this was subsequently discontinued.  . Atrial fibrillation status post cardioversion, 01/30/15 maintaining SR.  01/31/2015    a. 01/2015 s/p TEE/DCCV;  b. 02/06/2015  recurrent AF noted, amio added;  c. CHA2DS2VASc = 3-->eliquis.  . Mild LV dysfunction     a. 01/2015 Echo: EF 45-50%, mild MR, mildly dil LA, mod dil RV with mod to sev reduced fxn, mod-sev TR, PASP 2mmHg.     Assessment: 55 yof with CC of abdominal pain. On apixaban pta for afib, and pharmacy consulted to dose heparin while NPO with SBO. Last dose of apixaban was 1/4 pta. CBC wnl. No bleed documented. Will check baseline HL/aPTT and follow aPTTs until HLs correlating.  Goal of Therapy:  Heparin level 0.3-0.7 units/ml aPTT 66-102 seconds Monitor platelets by anticoagulation protocol: Yes   Plan:  Hold apixaban No bolus. Start heparin 900 units/h Baseline HL/aPTT 8h aPTT Daily aPTT/HL/CBC Mon s/sx bleeding  Elicia Lamp, PharmD, [facility] Clinical Pharmacist Pager [phone] 08/08/2015 1:39 PM

## 2015-08-08 NOTE — ED Notes (Signed)
Doctor notified patient pain and blood pressure.

## 2015-08-08 NOTE — ED Provider Notes (Signed)
CSN: [id number]     Arrival date & time 08/08/15  0705 History   First MD Initiated Contact with Patient 08/08/15 [phone]     Chief Complaint  Patient presents with  . Abdominal Pain     (Consider location/radiation/quality/duration/timing/severity/associated sxs/prior Treatment) HPI  70 year old female presents with acute, severe lower abdominal pain. Is over her entire lower abdomen and occasionally in her back. This started in the middle of the night and woke her up out of sleep. Patient has never had pain like this before. Pain is constant since it started, 5/10 in intensity. Pain occasionally worsens and goes up to an 8/10. No dysuria or hematuria. No diarrhea, constipation. Last bowel movement was yesterday and normal. Patient denies any nausea or vomiting. No chest pain, shortness of breath. Pain feels like a pressure in her lower abdomen. There is no weakness or numbness.  Past Medical History  Diagnosis Date  . URINARY INCONTINENCE 04-20-12    occ. with nighttime sleep pattern  . BENIGN POSITIONAL VERTIGO   . GERD   . DYSLIPIDEMIA   . Raynaud disease   . Complication of anesthesia 04-20-12    some issues with prolonged sedation after anesthesia  . Arthritis 04-20-12    Osteoarthritis-right hip  . HYPERTENSION     a. severe, pt intol of med tx, Pt. has severe "whitecoat" syndrome and refused medical therapy.  . Osteopenia   . Cataract   . Esophageal stricture   . Diverticulosis   . Hypothyroidism 09/29/2014    a. pt did not tolerate synthroid and this was subsequently discontinued.  . Atrial fibrillation status post cardioversion, 01/30/15 maintaining SR.  01/31/2015    a. 01/2015 s/p TEE/DCCV;  b. 02/06/2015 recurrent AF noted, amio added;  c. CHA2DS2VASc = 3-->eliquis.  . Mild LV dysfunction     a. 01/2015 Echo: EF 45-50%, mild MR, mildly dil LA, mod dil RV with mod to sev reduced fxn, mod-sev TR, PASP 46mmHg.   Past Surgical History  Procedure Laterality Date  . Nasal fracture  surgery  2006  . Tubal ligation  1980  . Cholecystectomy      '90-"sludge"  . Total hip arthroplasty  04/26/2012    Procedure: TOTAL HIP ARTHROPLASTY ANTERIOR APPROACH;  Surgeon: Mauri Pole, MD;  Location: [facility];  Service: Orthopedics;  Laterality: Right;  . Breast ultrasound Right 09/13/13    There is no sonographic evidence of malignancy. the [id number] complicated cyst in (R) breast is consistent with a benign finding. repeat in 1 year  . Tee without cardioversion N/A 01/30/2015    Procedure: TRANSESOPHAGEAL ECHOCARDIOGRAM (TEE);  Surgeon: Sanda Klein, MD;  Location: [place];  Service: Cardiovascular;  Laterality: N/A;  . Cardioversion N/A 01/30/2015    Procedure: CARDIOVERSION;  Surgeon: Sanda Klein, MD;  Location: [facility];  Service: Cardiovascular;  Laterality: N/A;   Family History  Problem Relation Age of Onset  . Adopted: Yes  . Other      patient was adopted   Social History  Substance Use Topics  . Smoking status: Never Smoker   . Smokeless tobacco: Never Used  . Alcohol Use: 1.2 oz/week    2 Glasses of wine per week     Comment: several glasses wine weekly   OB History    No data available     Review of Systems  Constitutional: Positive for chills. Negative for fever.  Respiratory: Negative for shortness of breath.   Cardiovascular: Negative for chest pain.  Gastrointestinal: Positive for  abdominal pain. Negative for nausea, vomiting, diarrhea and constipation.  Genitourinary: Negative for dysuria and hematuria.  Musculoskeletal: Positive for back pain.  All other systems reviewed and are negative.     Allergies  Levothyroxine; Statins; and Penicillins  Home Medications   Prior to Admission medications   Medication Sig Start Date End Date Taking? Authorizing Provider  acetaminophen (TYLENOL) 325 MG tablet Take 2 tablets (650 mg total) by mouth every 4 (four) hours as needed for headache or mild pain. 01/29/15   Erlene Quan, PA-C  Calcium  Carb-Cholecalciferol (CALCIUM 600 + D PO) Take 1 tablet by mouth daily.    Historical Provider, MD  diazepam (VALIUM) 5 MG tablet Take 1 tablet (5 mg total) by mouth every 8 (eight) hours as needed for anxiety. 04/29/15   [name], MD  ELIQUIS 5 MG TABS tablet TAKE 1 TABLET BY MOUTH TWICE A DAY 07/03/15   Minus Breeding, MD  estradiol (ESTRACE VAGINAL) 0.1 MG/GM vaginal cream Place 1 Applicatorful vaginally once a week. 1 gram once weekly Patient taking differently: Place 1 Applicatorful vaginally once a week. 1 gram once weekly on Sat or sun 01/29/15   Erlene Quan, PA-C  famotidine (PEPCID AC) 10 MG tablet Take 1 tablet (10 mg total) by mouth daily. 01/29/15   Erlene Quan, PA-C  fluticasone (FLONASE) 50 MCG/ACT nasal spray Place 1 spray into both nostrils daily as needed for allergies or rhinitis. 01/29/15   Erlene Quan, PA-C  miconazole (MICOTIN) 2 % powder Apply topically as needed for itching. Patient taking differently: Apply 1 application topically daily.  09/26/14   Rowe Clack, MD  Multiple Vitamins-Minerals (CENTRUM SILVER) tablet Take 1 tablet by mouth daily.     Historical Provider, MD  Omega 3 1000 MG CAPS Take 2,000 mg by mouth daily.    Historical Provider, MD   BP 190/114 mmHg  Pulse 82  Temp(Src) 97.4 F (36.3 C) (Oral)  Resp 15  SpO2 94% Physical Exam  Constitutional: She is oriented to person, place, and time. She appears well-developed and well-nourished.  HENT:  Head: Normocephalic and atraumatic.  Right Ear: External ear normal.  Left Ear: External ear normal.  Nose: Nose normal.  Eyes: Right eye exhibits no discharge. Left eye exhibits no discharge.  Cardiovascular: Normal rate, regular rhythm and normal heart sounds.   Pulmonary/Chest: Effort normal and breath sounds normal.  Abdominal: Soft. There is tenderness in the right upper quadrant.    Neurological: She is alert and oriented to person, place, and time.  Skin: Skin is warm and dry.    Nursing note and vitals reviewed.   ED Course  Procedures (including critical care time) Labs Review Labs Reviewed  COMPREHENSIVE METABOLIC PANEL - Abnormal; Notable for the following:    CO2 21 (*)    Glucose, Bld 117 (*)    BUN 25 (*)    Creatinine, Ser 1.91 (*)    Total Protein 6.3 (*)    Albumin 3.1 (*)    GFR calc non Af Amer 25 (*)    GFR calc Af Amer 29 (*)    All other components within normal limits  CBC - Abnormal; Notable for the following:    RDW 15.8 (*)    All other components within normal limits  LIPASE, BLOOD  URINALYSIS, ROUTINE W REFLEX MICROSCOPIC (NOT AT [facility])    Imaging Review Ct Abdomen Pelvis Wo Contrast  08/08/2015  CLINICAL DATA:  Low abdominal pain with radiation  to the right upper quadrant EXAM: CT ABDOMEN AND PELVIS WITHOUT CONTRAST TECHNIQUE: Multidetector CT imaging of the abdomen and pelvis was performed following the standard protocol without IV contrast. COMPARISON:  None. FINDINGS: Lower chest and abdominal wall: Epigastric and periumbilical ventral hernias containing fat. The umbilical hernia is largest. No superimposed inflammatory changes. Enlarged appearance of the right heart. Mild subpleural atelectasis or scarring in the right middle lobe and lingula. Hepatobiliary: No focal liver abnormality.Cholecystectomy with normal common bile duct diameter Pancreas: Unremarkable. Spleen: Unremarkable. Adrenals/Urinary Tract: Negative adrenals. No hydronephrosis or stone. Unremarkable bladder. Reproductive:9 mm right ovarian cystic structure is considered incidental based on small size. Stomach/Bowel: Mildly dilated small bowel with fluid filling at the level of the ileum with transition to decompressed terminal ileum. At the transition point there is twisting of bowel loops compatible with adhesive disease. See sagittal reformat image number 56. No decompression of the colon. Subtle haziness of mesenteric fat associated with the most distended loop of ileum.  No evidence of necrosis or perforation. Small sliding hiatal hernia Colonic diverticulosis. No appendicitis. Vascular/Lymphatic: No acute vascular abnormality. No mass or adenopathy. Peritoneal: No ascites or pneumoperitoneum. Musculoskeletal: Lower lumbar facet arthropathy with minimal anterolisthesis at L4-5. Right hip arthroplasty, unremarkable. IMPRESSION: 1. Probable early/ partial small bowel obstruction due to right lower quadrant adhesion. 2. Colonic diverticulosis. 3. Fatty midline hernias. Electronically Signed   By: Monte Fantasia M.D.   On: 08/08/2015 12:04   I have personally reviewed and evaluated these images and lab results as part of my medical decision-making.   EKG Interpretation   Date/Time:  Thursday August 08 2015 07:19:20 EST Ventricular Rate:  78 PR Interval:  144 QRS Duration: 80 QT Interval:  392 QTC Calculation: 446 R Axis:   -86 Text Interpretation:  Normal sinus rhythm Right atrial enlargement Left  axis deviation RSR' or QR pattern in V1 suggests right ventricular  conduction delay Cannot rule out Anterior infarct , age undetermined ST/T  wave changes no significant change since Sept 2016 Confirmed by Regenia Skeeter   MD, Cedar Roseman ([id number]) on 08/08/2015 9:06:05 AM      MDM   Final diagnoses:  None  Small bowel obstruction  Patient's CT shows an early/partial small bowel obstruction. Given she is symptomatically with significant abdominal pain, will place patient on fluids and admit to the hospitalist for symptomatic care.    Sherwood Gambler, MD 08/08/15 [phone]

## 2015-08-08 NOTE — H&P (Signed)
[facility] History and Physical                                                                                    Tiffany [name], is a 70 y.o. female  MRN: [id number]   DOB - 08-Nov-1945  Admit Date - 08/08/2015  Outpatient Primary MD for the patient is [name], MD  Referring MD: Regenia Skeeter / ER  PMH: Past Medical History  Diagnosis Date  . URINARY INCONTINENCE 04-20-12    occ. with nighttime sleep pattern  . BENIGN POSITIONAL VERTIGO   . GERD   . DYSLIPIDEMIA   . Raynaud disease   . Complication of anesthesia 04-20-12    some issues with prolonged sedation after anesthesia  . Arthritis 04-20-12    Osteoarthritis-right hip  . HYPERTENSION     a. severe, pt intol of med tx, Pt. has severe "whitecoat" syndrome and refused medical therapy.  . Osteopenia   . Cataract   . Esophageal stricture   . Diverticulosis   . Hypothyroidism 09/29/2014    a. pt did not tolerate synthroid and this was subsequently discontinued.  . Atrial fibrillation status post cardioversion, 01/30/15 maintaining SR.  01/31/2015    a. 01/2015 s/p TEE/DCCV;  b. 02/06/2015 recurrent AF noted, amio added;  c. CHA2DS2VASc = 3-->eliquis.  . Mild LV dysfunction     a. 01/2015 Echo: EF 45-50%, mild MR, mildly dil LA, mod dil RV with mod to sev reduced fxn, mod-sev TR, PASP 11mmHg.      PSH: Past Surgical History  Procedure Laterality Date  . Nasal fracture surgery  2006  . Tubal ligation  1980  . Cholecystectomy      '90-"sludge"  . Total hip arthroplasty  04/26/2012    Procedure: TOTAL HIP ARTHROPLASTY ANTERIOR APPROACH;  Surgeon: Mauri Pole, MD;  Location: [facility];  Service: Orthopedics;  Laterality: Right;  . Breast ultrasound Right 09/13/13    There is no sonographic evidence of malignancy. the [id number] complicated cyst in (R) breast is consistent with a benign finding. repeat in 1 year  . Tee without cardioversion N/A 01/30/2015    Procedure: TRANSESOPHAGEAL ECHOCARDIOGRAM (TEE);  Surgeon: Sanda Klein, MD;  Location: [facility];  Service: Cardiovascular;  Laterality: N/A;  . Cardioversion N/A 01/30/2015    Procedure: CARDIOVERSION;  Surgeon: Sanda Klein, MD;  Location: [facility];  Service: Cardiovascular;  Laterality: N/A;     CC:  Chief Complaint  Patient presents with  . Abdominal Pain     HPI: 70 year old female patient known history of chronic kidney disease now apparently at stage IV, H or fibrillation post cardioversion on 6/29 maintaining sinus rhythm, anxiety, hypothyroidism intolerant to levothyroxine, hypertension, dyslipidemia, GERD and obesity. Patient has a remote history of greater than 15-20 years of prior cholecystectomy. Of note patient has recently been having difficulty with dyspnea on exertion, shortness of breath and fatigue. In September she had a nonischemic nuclear med stress test. She has undergone PFTs which were questionable for COPD but otherwise did not explain her shortness of breath. Echocardiogram in September revealed some RV dilatation and moderate to severe tricuspid regurgitation with mild to moderate elevation  in pulmonary pressures. In October she was evaluated by cardiology and her amiodarone was discontinued. Patient did not appear to be volume overloaded and was continued on her every other day Lasix. Patient presents to the ER with abrupt onset of lower abdominal pain without nausea or vomiting. Last normal BM yesterday. She attempted to go to the bathroom after onset of abdominal pain and was unable to pass flatus or bowel movement. She described the pain as being 8/10. No fevers or chills.  ER Evaluation and treatment: Afebrile with moderate hypertension BP 173/117 with repeat blood pressure 157/108. Patient with documented known whitecoat hypertension. Maintaining sinus rhythm with pulse in the 60s, room air saturations 94% prompting nursing to apply 2 L nasal cannula oxygen. EKG: Sinus rhythm with ventricular rate 70 bpm, QTC 446 ms,  peaked P waves concerning for right atrial enlargement, RSR pattern concerning as well for RV conduction delay, no ischemic changes CT the abdomen and pelvis without IV contrast: Probable early/partial small bowel obstruction due to right lower quadrant adhesion, colonic diverticulosis without diverticulitis BUN and creatinine at baseline for this patient Glucose 117 Albumin 3.1 Known 50 g IV 1 dose 1000 mL normal saline IV Fentanyl 100 g IV 1 dose  Review of Systems   In addition to the HPI above,  No Fever-chills, myalgias or other constitutional symptoms No Headache, changes with Vision or hearing, new weakness, tingling, numbness in any extremity, No problems swallowing food or Liquids, indigestion/reflux No Chest pain, Cough, palpitations, orthopnea  No N/V; no melena or hematochezia, no dark tarry stools No dysuria, hematuria or flank pain No new skin rashes, lesions, masses or bruises, No new joints pains-aches No recent weight gain or loss No polyuria, polydypsia or polyphagia,  *A full 10 point Review of Systems was done, except as stated above, all other Review of Systems were negative.  Social History Social History  Substance Use Topics  . Smoking status: Never Smoker   . Smokeless tobacco: Never Used  . Alcohol Use: 1.2 oz/week    2 Glasses of wine per week     Comment: several glasses wine weekly    Resides at: Private residence  Lives with: Spouse  Ambulatory status: Without assistive devices   Family History Family History  Problem Relation Age of Onset  . Adopted: Yes  . Other      patient was adopted     Prior to Admission medications   Medication Sig Start Date End Date Taking? Authorizing Provider  acetaminophen (TYLENOL) 325 MG tablet Take 2 tablets (650 mg total) by mouth every 4 (four) hours as needed for headache or mild pain. 01/29/15  Yes Luke K Kilroy, PA-C  Calcium Carb-Cholecalciferol (CALCIUM 600 + D PO) Take 1 tablet by mouth  daily.   Yes Historical Provider, MD  diazepam (VALIUM) 5 MG tablet Take 1 tablet (5 mg total) by mouth every 8 (eight) hours as needed for anxiety. 04/29/15  Yes [name], MD  ELIQUIS 5 MG TABS tablet TAKE 1 TABLET BY MOUTH TWICE A DAY 07/03/15  Yes Minus Breeding, MD  estradiol (ESTRACE VAGINAL) 0.1 MG/GM vaginal cream Place 1 Applicatorful vaginally once a week. 1 gram once weekly Patient taking differently: Place 1 Applicatorful vaginally once a week. 1 gram once weekly on Sat or sun 01/29/15  Yes Luke K Kilroy, PA-C  famotidine (PEPCID AC) 10 MG tablet Take 1 tablet (10 mg total) by mouth daily. 01/29/15  Yes Luke K Kilroy, PA-C  fluticasone [facility])  50 MCG/ACT nasal spray Place 1 spray into both nostrils daily as needed for allergies or rhinitis. 01/29/15  Yes Luke K Kilroy, PA-C  furosemide (LASIX) 40 MG tablet Take 40 mg by mouth every other day.  07/31/15  Yes Historical Provider, MD  KLOR-CON M20 20 MEQ tablet Take 20 mEq by mouth every other day. Take with furosemide 07/31/15  Yes Historical Provider, MD  miconazole (MICOTIN) 2 % powder Apply topically as needed for itching. Patient taking differently: Apply 1 application topically daily.  09/26/14  Yes Rowe Clack, MD  Multiple Vitamins-Minerals (CENTRUM SILVER) tablet Take 1 tablet by mouth daily.    Yes Historical Provider, MD  Omega 3 1000 MG CAPS Take 2,000 mg by mouth daily.   Yes Historical Provider, MD    Allergies  Allergen Reactions  . Levothyroxine Shortness Of Breath and Other (See Comments)    Exhaustion also  . Statins Other (See Comments)    "Pain, weakness and kidney problems"  . Penicillins Other (See Comments)    dry mouth Has patient had a PCN reaction causing immediate rash, facial/tongue/throat swelling, SOB or lightheadedness with hypotension: No Has patient had a PCN reaction causing severe rash involving mucus membranes or skin necrosis: No Has patient had a PCN reaction that required  hospitalization No Has patient had a PCN reaction occurring within the last 10 years: No If all of the above answers are "NO", then may proceed with Cephalosporin use.     Physical Exam  Vitals  Blood pressure 157/108, pulse 60, temperature 97.4 F (36.3 C), temperature source Oral, resp. rate 22, SpO2 94 %.   General:  In no acute distress, appears healthy and well nourished  Psych:  Normal low somewhat anxious affect, Denies Suicidal or Homicidal ideations, Awake Alert, Oriented X 3. Speech and thought patterns are clear and appropriate, no apparent short term memory deficits  Neuro:   No focal neurological deficits, CN II through XII intact except for known hard of hearing and requires hearing aids, Strength 5/5 all 4 extremities, Sensation intact all 4 extremities.  ENT:  Ears and Eyes appear Normal, Conjunctivae clear, PER. Moist oral mucosa without erythema or exudates.  Neck:  Supple, No lymphadenopathy appreciated  Respiratory:  Symmetrical chest wall movement, Good air movement bilaterally, CTAB. Room Air  Cardiac:  RRR, No Murmurs, no LE edema noted, no JVD, No carotid bruits, peripheral pulses palpable at 2+  Abdomen:  Absent bowel sounds, Soft, minimally tender bilateral lower abdomen, Non distended,  No masses appreciated, no obvious hepatosplenomegaly  Skin:  No Cyanosis, Normal Skin Turgor, No Skin Rash or Bruise.  Extremities: Symmetrical without obvious trauma or injury,  no effusions.  Data Review  CBC  Recent Labs Lab 08/08/15 0700  WBC 6.4  HGB 14.2  HCT 44.3  PLT 194  MCV 96.7  MCH 31.0  MCHC 32.1  RDW 15.8*    Chemistries   Recent Labs Lab 08/08/15 0700  NA 140  K 4.4  CL 109  CO2 21*  GLUCOSE 117*  BUN 25*  CREATININE 1.91*  CALCIUM 9.1  AST 27  ALT 30  ALKPHOS 71  BILITOT 0.9    CrCl cannot be calculated (Unknown ideal weight.).  No results for input(s): TSH, T4TOTAL, T3FREE, THYROIDAB in the last 72 hours.  Invalid  input(s): FREET3  Coagulation profile No results for input(s): INR, PROTIME in the last 168 hours.  No results for input(s): DDIMER in the last 72 hours.  Cardiac Enzymes  No results for input(s): CKMB, TROPONINI, MYOGLOBIN in the last 168 hours.  Invalid input(s): CK  Invalid input(s): POCBNP  Urinalysis    Component Value Date/Time   COLORURINE YELLOW 04/14/2015 1645   APPEARANCEUR CLOUDY* 04/14/2015 1645   LABSPEC 1.018 04/14/2015 1645   PHURINE 6.5 04/14/2015 [place] 04/14/2015 1645   GLUCOSEU NEGATIVE 02/13/2015 1112   HGBUR NEGATIVE 04/14/2015 1645   BILIRUBINUR NEGATIVE 04/14/2015 1645   KETONESUR NEGATIVE 04/14/2015 1645   PROTEINUR NEGATIVE 04/14/2015 1645   UROBILINOGEN 0.2 04/14/2015 1645   NITRITE NEGATIVE 04/14/2015 1645   LEUKOCYTESUR LARGE* 04/14/2015 1645    Imaging results:   Ct Abdomen Pelvis Wo Contrast  08/08/2015  CLINICAL DATA:  Low abdominal pain with radiation to the right upper quadrant EXAM: CT ABDOMEN AND PELVIS WITHOUT CONTRAST TECHNIQUE: Multidetector CT imaging of the abdomen and pelvis was performed following the standard protocol without IV contrast. COMPARISON:  None. FINDINGS: Lower chest and abdominal wall: Epigastric and periumbilical ventral hernias containing fat. The umbilical hernia is largest. No superimposed inflammatory changes. Enlarged appearance of the right heart. Mild subpleural atelectasis or scarring in the right middle lobe and lingula. Hepatobiliary: No focal liver abnormality.Cholecystectomy with normal common bile duct diameter Pancreas: Unremarkable. Spleen: Unremarkable. Adrenals/Urinary Tract: Negative adrenals. No hydronephrosis or stone. Unremarkable bladder. Reproductive:9 mm right ovarian cystic structure is considered incidental based on small size. Stomach/Bowel: Mildly dilated small bowel with fluid filling at the level of the ileum with transition to decompressed terminal ileum. At the transition point  there is twisting of bowel loops compatible with adhesive disease. See sagittal reformat image number 56. No decompression of the colon. Subtle haziness of mesenteric fat associated with the most distended loop of ileum. No evidence of necrosis or perforation. Small sliding hiatal hernia Colonic diverticulosis. No appendicitis. Vascular/Lymphatic: No acute vascular abnormality. No mass or adenopathy. Peritoneal: No ascites or pneumoperitoneum. Musculoskeletal: Lower lumbar facet arthropathy with minimal anterolisthesis at L4-5. Right hip arthroplasty, unremarkable. IMPRESSION: 1. Probable early/ partial small bowel obstruction due to right lower quadrant adhesion. 2. Colonic diverticulosis. 3. Fatty midline hernias. Electronically Signed   By: Monte Fantasia M.D.   On: 08/08/2015 12:04     EKG: (Independently reviewed) Sinus rhythm with ventricular rate 70 bpm, QTC 446 ms, peaked P waves concerning for right atrial enlargement, RSR pattern concerning as well for RV conduction delay, no ischemic changes   Assessment & Plan  Principal Problem:   Partial small bowel obstruction  -Inpatient/telemetry -Nondistended and no emesis so will defer NG tube for now -Check lactic acid; 235: was mildly elevated at 2.1 but clinically looks stable so will repeat in 3 hrs -NPO, bowel rest, IV narcotics and anti-emetics -Follow clinically -D5 normal saline at 100 per hour  Active Problems:   CKD stage 4, GFR 15-29 ml/min  -Renal function at baseline -Follow labs    Atrial fibrillation status post cardioversion, 01/30/15 maintaining SR.  -Avoid beta blocker since significant bradycardia with symptoms with previous use -Continue telemetry    Exertional shortness of breath -Ongoing issue for greater than 6 months -Outpatient evaluation in process with pulmonologist -Echo concerning for RV dilatation with significant tricuspid regurgitation and moderate pulmonary hypertension noting this may explain patient's  symptoms -Patient reports on QO day Lasix to help with breathing currently on hold given above bowel obstruction issues    Anxiety -IV when necessary benzodiazepines noting was on Valium at home    Hypothyroidism -Not on medications and recent outpatient  TSH mildly elevated -Management per PCP    HTN  -Currently uncontrolled and likely secondary to ongoing abdominal pain as well as anxiety and documented history of whitecoat hypertension -Only on Lasix at home -Consider prn use of clonidine patch noting history of symptomatic bradycardia with beta blockers in the past and with stage IV chronic kidney disease would not be appropriate candidate for IV ACE inhibitor -Consider prn hydralazine but with RV dilatation need to monitor closely regarding possible syncope    Hyperlipidemia -Allergy to statins    Obesity-BMI 31    GERD -Convert home PPI to IV    DVT Prophylaxis: Full dose IV heparin to replace preadmission eliquis  Family Communication:   Husband at bedside  Code Status:  Full code  Condition:  Stable  Discharge disposition: Anticipate discharge back to previous home environment once medically stable and bowel obstructive symptoms have resolved  Time spent in minutes : 60      Lakiesha Ralphs L. ANP on 08/08/2015 at 1:41 PM  You may contact me by going to [email or web address] - password [email or web address]  I am available from 7a-7p but please confirm I am on the schedule by going to Amion as above.   After 7p please contact night coverage person covering me after hours  [facility]

## 2015-08-08 NOTE — ED Notes (Addendum)
Pt reports she woke up in the night with lower abd pain radiating around to her back. Denies v/d. Pain is intermittent, is very strong when it comes.

## 2015-08-09 ENCOUNTER — Inpatient Hospital Stay (HOSPITAL_COMMUNITY): Payer: Medicare Other

## 2015-08-09 DIAGNOSIS — K5669 Other intestinal obstruction: Secondary | ICD-10-CM

## 2015-08-09 DIAGNOSIS — K210 Gastro-esophageal reflux disease with esophagitis: Secondary | ICD-10-CM

## 2015-08-09 DIAGNOSIS — I4891 Unspecified atrial fibrillation: Secondary | ICD-10-CM

## 2015-08-09 DIAGNOSIS — N184 Chronic kidney disease, stage 4 (severe): Secondary | ICD-10-CM

## 2015-08-09 DIAGNOSIS — F419 Anxiety disorder, unspecified: Secondary | ICD-10-CM

## 2015-08-09 LAB — CBC
HCT: 42.5 % (ref 36.0–46.0)
Hemoglobin: 13.7 g/dL (ref 12.0–15.0)
MCH: 30.9 pg (ref 26.0–34.0)
MCHC: 32.2 g/dL (ref 30.0–36.0)
MCV: 95.9 fL (ref 78.0–100.0)
Platelets: 175 10*3/uL (ref 150–400)
RBC: 4.43 MIL/uL (ref 3.87–5.11)
RDW: 15.8 % — ABNORMAL HIGH (ref 11.5–15.5)
WBC: 5.1 10*3/uL (ref 4.0–10.5)

## 2015-08-09 LAB — URINE CULTURE

## 2015-08-09 LAB — APTT: aPTT: 113 seconds — ABNORMAL HIGH (ref 24–37)

## 2015-08-09 LAB — HEPARIN LEVEL (UNFRACTIONATED): Heparin Unfractionated: 1.14 IU/mL — ABNORMAL HIGH (ref 0.30–0.70)

## 2015-08-09 MED ORDER — HYDRALAZINE HCL 20 MG/ML IJ SOLN: 10.0000 mg | Freq: Four times a day (QID) | INTRAMUSCULAR | Status: DC | PRN

## 2015-08-09 MED ORDER — FAMOTIDINE 20 MG PO TABS
10.0000 mg | ORAL_TABLET | Freq: Every day | ORAL | Status: DC
  Administered 2015-08-09: 10 mg via ORAL
  Filled 2015-08-09: qty 1

## 2015-08-09 MED ORDER — FLUTICASONE PROPIONATE 50 MCG/ACT NA SUSP: 1.0000 | Freq: Every day | NASAL | Status: DC | PRN

## 2015-08-09 MED ORDER — AMLODIPINE BESYLATE 5 MG PO TABS: 5.0000 mg | ORAL_TABLET | Freq: Every day | ORAL | Status: DC

## 2015-08-09 MED ORDER — AMLODIPINE BESYLATE 5 MG PO TABS
5.0000 mg | ORAL_TABLET | Freq: Every day | ORAL | Status: DC
  Administered 2015-08-09: 5 mg via ORAL
  Filled 2015-08-09: qty 1

## 2015-08-09 MED ORDER — APIXABAN 5 MG PO TABS
5.0000 mg | ORAL_TABLET | Freq: Two times a day (BID) | ORAL | Status: DC
  Administered 2015-08-09: 5 mg via ORAL
  Filled 2015-08-09: qty 1

## 2015-08-09 MED ORDER — ACETAMINOPHEN 325 MG PO TABS: 650.0000 mg | ORAL_TABLET | ORAL | Status: DC | PRN

## 2015-08-09 NOTE — Progress Notes (Signed)
Dr Tana Coast returned call and informed her of current BP 160's/100's and pt refusing to take PRN hydralazine.  Also if IVF can be Lenn Cal.  MD instructed can S.L IVF.  And pt can be d/c if she tolerate her soft diet at lunch.  Will continue to monitor.  Karie Kirks, Therapist, sports.

## 2015-08-09 NOTE — Progress Notes (Signed)
At 1531 pt d/c of floor after all d/c instructions explained and given to pt.  Verbalized understanding.  D/c off floor via w/c to awaiting transport.   Sherley Leser, Therapist, sports.

## 2015-08-09 NOTE — Progress Notes (Signed)
[facility]                                                                              Patient Demographics  Tiffany [name], is a 70 y.o. female, DOB - [birth year], [id number]  Admit date - 08/08/2015   Admitting Physician Waldemar Dickens, MD  Outpatient Primary MD for the patient is [name], MD  LOS - 1   Chief Complaint  Patient presents with  . Abdominal Pain       Brief HPI  Patient is a 70 year old female with CKD A. fib, anxiety, hypothyroidism, hypertension, dyslipidemia, GERD presented with abrupt onset of lower abdominal pain, no nausea or vomiting. Last normal BM was today before the admission. She was unable to pass flatus or bowel movements when she attempted to use the bathroom. 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 Patient was admitted for further workup.    Assessment & Plan    Principal Problem: Partial small bowel obstruction   Patient was not having any nausea or vomiting hence no NG tube was placed. Patient was placed on NPO status, bowel rest, IV fluids and antiemetics, and conservatively managed. Patient's symptoms have resolved, no abdominal pain and passing flatus. Abdominal x-ray was obtained which showed complete resolution of the SBO. Patient was started on solid diet.   CKD stage 4, GFR 15-29 ml/min  -Renal function at baseline   Atrial fibrillation status post cardioversion, 01/30/15 maintaining SR.  -Avoid beta blocker since significant bradycardia with symptoms with previous use   Exertional shortness of breath -Outpatient evaluation in process with pulmonologist -Echo concerning for RV dilatation with significant tricuspid regurgitation and moderate pulmonary hypertension noting this may explain patient's symptoms -Patient reports on QO day Lasix to help with breathing   Anxiety -Continue Valium    Hypothyroidism -Not on medications and recent outpatient TSH mildly elevated -Management per PCP   HTN  -Currently uncontrolled and likely secondary to ongoing abdominal pain as well as anxiety and documented history of whitecoat hypertension. Patient is only on Lasix at home. She was recommended Norvasc. She refused IV as needed hydralazine while inpatient.   Hyperlipidemia -Allergy to statins   Obesity-BMI 31   GERD -Continue PPI   Code Status: Full code  Family Communication: Discussed in detail with the patient, all imaging results, lab results explained to the patient   Disposition Plan: Hopefully DC home today  Time Spent in minutes  25 minutes  Procedures  CT abd ABd xray  Consults   None   DVT Prophylaxis  apixaban  Medications  Scheduled Meds: . amLODipine  5 mg Oral Daily  . apixaban  5 mg Oral BID  . famotidine  10 mg Oral Daily  . sodium chloride  3 mL Intravenous Q12H   Continuous Infusions: . dextrose 5 % and 0.9% NaCl 100 mL/hr at 08/08/15 1442   PRN Meds:.acetaminophen **OR** acetaminophen, acetaminophen, fluticasone, hydrALAZINE, LORazepam, ondansetron **OR** ondansetron (ZOFRAN) IV   Antibiotics   Anti-infectives    None        Subjective:  Tiffany [name] was seen and examined today.  Patient denies dizziness, chest pain, shortness of breath, abdominal pain, N/V/D/C, new weakness, numbess, tingling. No acute events overnight.    Objective:   Blood pressure 160/108, pulse 75, temperature 97.9 F (36.6 C), temperature source Oral, resp. rate 16, height 5' (1.524 m), weight 74.1 kg (163 lb 5.8 oz), SpO2 95 %.  Wt Readings from Last 3 Encounters:  08/09/15 74.1 kg (163 lb 5.8 oz)  07/15/15 72.122 kg (159 lb)  05/20/15 71.94 kg (158 lb 9.6 oz)     Intake/Output Summary (Last 24 hours) at 08/09/15 1202 Last data filed at 08/09/15 1040  Gross per 24 hour  Intake   51.3 ml  Output   1751 ml  Net -1699.7 ml     Exam  General: Alert and oriented x 3, NAD  HEENT:  PERRLA, EOMI, Anicteric Sclera, mucous membranes moist.   Neck: Supple, no JVD, no masses  CVS: S1 S2 auscultated, no rubs, murmurs or gallops. Regular rate and rhythm.  Respiratory: Clear to auscultation bilaterally, no wheezing, rales or rhonchi  Abdomen: Soft, nontender, nondistended, + bowel sounds  Ext: no cyanosis clubbing or edema  Neuro: AAOx3, Cr N's II- XII. Strength 5/5 upper and lower extremities bilaterally  Skin: No rashes  Psych: Normal affect and demeanor, alert and oriented x3    Data Review   Micro Results Recent Results (from the past 240 hour(s))  Urine culture     Status: None (Preliminary result)   Collection Time: 08/08/15  6:55 PM  Result Value Ref Range Status   Specimen Description URINE, CLEAN CATCH  Final   Special Requests NONE  Final   Culture NO GROWTH < 12 HOURS  Final   Report Status PENDING  Incomplete    Radiology Reports Ct Abdomen Pelvis Wo Contrast  08/08/2015  CLINICAL DATA:  Low abdominal pain with radiation to the right upper quadrant EXAM: CT ABDOMEN AND PELVIS WITHOUT CONTRAST TECHNIQUE: Multidetector CT imaging of the abdomen and pelvis was performed following the standard protocol without IV contrast. COMPARISON:  None. FINDINGS: Lower chest and abdominal wall: Epigastric and periumbilical ventral hernias containing fat. The umbilical hernia is largest. No superimposed inflammatory changes. Enlarged appearance of the right heart. Mild subpleural atelectasis or scarring in the right middle lobe and lingula. Hepatobiliary: No focal liver abnormality.Cholecystectomy with normal common bile duct diameter Pancreas: Unremarkable. Spleen: Unremarkable. Adrenals/Urinary Tract: Negative adrenals. No hydronephrosis or stone. Unremarkable bladder. Reproductive:9 mm right ovarian cystic structure is considered incidental based on small size. Stomach/Bowel: Mildly dilated small bowel with  fluid filling at the level of the ileum with transition to decompressed terminal ileum. At the transition point there is twisting of bowel loops compatible with adhesive disease. See sagittal reformat image number 56. No decompression of the colon. Subtle haziness of mesenteric fat associated with the most distended loop of ileum. No evidence of necrosis or perforation. Small sliding hiatal hernia Colonic diverticulosis. No appendicitis. Vascular/Lymphatic: No acute vascular abnormality. No mass or adenopathy. Peritoneal: No ascites or pneumoperitoneum. Musculoskeletal: Lower lumbar facet arthropathy with minimal anterolisthesis at L4-5. Right hip arthroplasty, unremarkable. IMPRESSION: 1. Probable early/ partial small bowel obstruction due to right lower quadrant adhesion. 2. Colonic diverticulosis. 3. Fatty midline hernias. Electronically Signed   By: Monte Fantasia M.D.   On: 08/08/2015 12:04   Dg Abd 2 Views  08/09/2015  CLINICAL DATA:  Small-bowel obstruction.  Abdominal pain. EXAM: ABDOMEN - 2 VIEW COMPARISON:  CT abdomen pelvis 08/08/2015  FINDINGS: Residual contrast in the colon from recent CT. Negative for bowel obstruction. There is contrast in the rectum. No ileus or air-fluid level in the bowel. Negative for free air. Surgical clips in the gallbladder fossa. Prior right hip replacement. No acute skeletal abnormality. No renal calculi. IMPRESSION: Normal bowel gas pattern. Contrast does reach the rectum from CT yesterday. Electronically Signed   By: Franchot Gallo M.D.   On: 08/09/2015 09:38    CBC  Recent Labs Lab 08/08/15 0700 08/09/15 0458  WBC 6.4 5.1  HGB 14.2 13.7  HCT 44.3 42.5  PLT 194 175  MCV 96.7 95.9  MCH 31.0 30.9  MCHC 32.1 32.2  RDW 15.8* 15.8*    Chemistries   Recent Labs Lab 08/08/15 0700 08/08/15 1325  NA 140  --   K 4.4  --   CL 109  --   CO2 21*  --   GLUCOSE 117*  --   BUN 25*  --   CREATININE 1.91*  --   CALCIUM 9.1  --   MG  --  1.9  AST 27  --    ALT 30  --   ALKPHOS 71  --   BILITOT 0.9  --    ------------------------------------------------------------------------------------------------------------------ estimated creatinine clearance is 24.3 mL/min (by C-G formula based on Cr of 1.91). ------------------------------------------------------------------------------------------------------------------ No results for input(s): HGBA1C in the last 72 hours. ------------------------------------------------------------------------------------------------------------------ No results for input(s): CHOL, HDL, LDLCALC, TRIG, CHOLHDL, LDLDIRECT in the last 72 hours. ------------------------------------------------------------------------------------------------------------------ No results for input(s): TSH, T4TOTAL, T3FREE, THYROIDAB in the last 72 hours.  Invalid input(s): FREET3 ------------------------------------------------------------------------------------------------------------------ No results for input(s): VITAMINB12, FOLATE, FERRITIN, TIBC, IRON, RETICCTPCT in the last 72 hours.  Coagulation profile No results for input(s): INR, PROTIME in the last 168 hours.  No results for input(s): DDIMER in the last 72 hours.  Cardiac Enzymes No results for input(s): CKMB, TROPONINI, MYOGLOBIN in the last 168 hours.  Invalid input(s): CK ------------------------------------------------------------------------------------------------------------------ Invalid input(s): POCBNP  No results for input(s): GLUCAP in the last 72 hours.   Tiffany [name] M.D. [facility] 08/09/2015, 12:02 PM  Pager: [phone] Between 7am to 7pm - call Pager - [phone]  After 7pm go to [email or web address] - password [email or web address]  Call night coverage person covering after 7pm

## 2015-08-09 NOTE — Discharge Summary (Signed)
Physician Discharge Summary   Patient ID: Tiffany [name] MRN: [id number] DOB/AGE: 1946-06-24 70 y.o.  Admit date: 08/08/2015 Discharge date: 08/09/2015  Primary Care Physician:  [name], MD  Discharge Diagnoses:    . Partial small bowel obstruction ([place]) . Hyperlipidemia . Obesity-BMI 31 . GERD . Anxiety . Hypothyroidism . HTN (hypertension) . CKD (chronic kidney disease) stage 4, GFR 15-29 ml/min ([facility]) . Atrial fibrillation status post cardioversion, 01/30/15 maintaining SR.    Consults:  None  Recommendations for Outpatient Follow-up:  1. Please follow BP closely, patient was placed on Norvasc 5 mg daily however she is reluctant to take it. 2. Please repeat CBC/BMET at next visit    DIET: Soft diet    Allergies:   Allergies  Allergen Reactions  . Levothyroxine Shortness Of Breath and Other (See Comments)    Exhaustion also  . Statins Other (See Comments)    "Pain, weakness and kidney problems"  . Penicillins Other (See Comments)    dry mouth Has patient had a PCN reaction causing immediate rash, facial/tongue/throat swelling, SOB or lightheadedness with hypotension: No Has patient had a PCN reaction causing severe rash involving mucus membranes or skin necrosis: No Has patient had a PCN reaction that required hospitalization No Has patient had a PCN reaction occurring within the last 10 years: No If all of the above answers are "NO", then may proceed with Cephalosporin use.      DISCHARGE MEDICATIONS: Current Discharge Medication List    START taking these medications   Details  amLODipine (NORVASC) 5 MG tablet Take 1 tablet (5 mg total) by mouth daily. Qty: 30 tablet, Refills: 3      CONTINUE these medications which have NOT CHANGED   Details  acetaminophen (TYLENOL) 325 MG tablet Take 2 tablets (650 mg total) by mouth every 4 (four) hours as needed for headache or mild pain.    Calcium Carb-Cholecalciferol (CALCIUM 600 + D PO) Take 1  tablet by mouth daily.    diazepam (VALIUM) 5 MG tablet Take 1 tablet (5 mg total) by mouth every 8 (eight) hours as needed for anxiety. Qty: 30 tablet, Refills: 0    ELIQUIS 5 MG TABS tablet TAKE 1 TABLET BY MOUTH TWICE A DAY Qty: 60 tablet, Refills: 5    estradiol (ESTRACE VAGINAL) 0.1 MG/GM vaginal cream Place 1 Applicatorful vaginally once a week. 1 gram once weekly Qty: 42.5 g, Refills: 1    famotidine (PEPCID AC) 10 MG tablet Take 1 tablet (10 mg total) by mouth daily.    fluticasone (FLONASE) 50 MCG/ACT nasal spray Place 1 spray into both nostrils daily as needed for allergies or rhinitis. Qty: 16 g, Refills: 2    furosemide (LASIX) 40 MG tablet Take 40 mg by mouth every other day.  Refills: 0    KLOR-CON M20 20 MEQ tablet Take 20 mEq by mouth every other day. Take with furosemide Refills: 11    miconazole (MICOTIN) 2 % powder Apply topically as needed for itching. Qty: 70 g, Refills: 0    Multiple Vitamins-Minerals (CENTRUM SILVER) tablet Take 1 tablet by mouth daily.     Omega 3 1000 MG CAPS Take 2,000 mg by mouth daily.         Brief H and P: For complete details please refer to admission H and P, but in brief Patient is a 70 year old female with CKD A. fib, anxiety, hypothyroidism, hypertension, dyslipidemia, GERD presented with abrupt onset of lower abdominal pain, no nausea or  vomiting. Last normal BM was today before the admission. She was unable to pass flatus or bowel movements when she attempted to use the bathroom. 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 Patient was admitted for further workup.  Hospital Course:  Partial small bowel obstruction  Patient was not having any nausea or vomiting hence no NG tube was placed. Patient was placed on NPO status, bowel rest, IV fluids and antiemetics, and conservatively managed. Patient's symptoms have resolved, no  abdominal pain and passing flatus. Abdominal x-ray was obtained which showed complete resolution of the SBO. Patient was started on solid diet which she is tolerating well. She had 2 BMs prior to discharge.   CKD stage 4, GFR 15-29 ml/min  -Renal function at baseline   Atrial fibrillation status post cardioversion, 01/30/15 maintaining SR.  -Avoid beta blocker since significant bradycardia with symptoms with previous use   Exertional shortness of breath -Outpatient evaluation in process with pulmonologist -Echo concerning for RV dilatation with significant tricuspid regurgitation and moderate pulmonary hypertension noting this may explain patient's symptoms -Patient reports on QO day Lasix to help with breathing   Anxiety -Continue Valium   Hypothyroidism -Not on medications and recent outpatient TSH mildly elevated -Management per PCP   HTN  -Currently uncontrolled and likely secondary to ongoing abdominal pain as well as anxiety and documented history of whitecoat hypertension. Patient is only on Lasix at home. She was recommended Norvasc. She refused IV as needed hydralazine while inpatient.   Hyperlipidemia -Allergy to statins   Obesity-BMI 31   GERD -Continue PPI     Day of Discharge BP 160/108 mmHg  Pulse 75  Temp(Src) 97.9 F (36.6 C) (Oral)  Resp 16  Ht 5' (1.524 m)  Wt 74.1 kg (163 lb 5.8 oz)  BMI 31.90 kg/m2  SpO2 95%  Physical Exam: General: Alert and awake oriented x3 not in any acute distress. HEENT: anicteric sclera, pupils reactive to light and accommodation CVS: S1-S2 clear no murmur rubs or gallops Chest: clear to auscultation bilaterally, no wheezing rales or rhonchi Abdomen: soft nontender, nondistended, normal bowel sounds Extremities: no cyanosis, clubbing or edema noted bilaterally Neuro: Cranial nerves II-XII intact, no focal neurological deficits   The results of significant diagnostics from this hospitalization (including  imaging, microbiology, ancillary and laboratory) are listed below for reference.    LAB RESULTS: Basic Metabolic Panel:  Recent Labs Lab 08/08/15 0700 08/08/15 1325  NA 140  --   K 4.4  --   CL 109  --   CO2 21*  --   GLUCOSE 117*  --   BUN 25*  --   CREATININE 1.91*  --   CALCIUM 9.1  --   MG  --  1.9   Liver Function Tests:  Recent Labs Lab 08/08/15 0700  AST 27  ALT 30  ALKPHOS 71  BILITOT 0.9  PROT 6.3*  ALBUMIN 3.1*    Recent Labs Lab 08/08/15 0700  LIPASE 36   No results for input(s): AMMONIA in the last 168 hours. CBC:  Recent Labs Lab 08/08/15 0700 08/09/15 0458  WBC 6.4 5.1  HGB 14.2 13.7  HCT 44.3 42.5  MCV 96.7 95.9  PLT 194 175   Cardiac Enzymes: No results for input(s): CKTOTAL, CKMB, CKMBINDEX, TROPONINI in the last 168 hours. BNP: Invalid input(s): POCBNP CBG: No results for input(s): GLUCAP in the last 168 hours.  Significant Diagnostic Studies:  Ct  Abdomen Pelvis Wo Contrast  08/08/2015  CLINICAL DATA:  Low abdominal pain with radiation to the right upper quadrant EXAM: CT ABDOMEN AND PELVIS WITHOUT CONTRAST TECHNIQUE: Multidetector CT imaging of the abdomen and pelvis was performed following the standard protocol without IV contrast. COMPARISON:  None. FINDINGS: Lower chest and abdominal wall: Epigastric and periumbilical ventral hernias containing fat. The umbilical hernia is largest. No superimposed inflammatory changes. Enlarged appearance of the right heart. Mild subpleural atelectasis or scarring in the right middle lobe and lingula. Hepatobiliary: No focal liver abnormality.Cholecystectomy with normal common bile duct diameter Pancreas: Unremarkable. Spleen: Unremarkable. Adrenals/Urinary Tract: Negative adrenals. No hydronephrosis or stone. Unremarkable bladder. Reproductive:9 mm right ovarian cystic structure is considered incidental based on small size. Stomach/Bowel: Mildly dilated small bowel with fluid filling at the level of the  ileum with transition to decompressed terminal ileum. At the transition point there is twisting of bowel loops compatible with adhesive disease. See sagittal reformat image number 56. No decompression of the colon. Subtle haziness of mesenteric fat associated with the most distended loop of ileum. No evidence of necrosis or perforation. Small sliding hiatal hernia Colonic diverticulosis. No appendicitis. Vascular/Lymphatic: No acute vascular abnormality. No mass or adenopathy. Peritoneal: No ascites or pneumoperitoneum. Musculoskeletal: Lower lumbar facet arthropathy with minimal anterolisthesis at L4-5. Right hip arthroplasty, unremarkable. IMPRESSION: 1. Probable early/ partial small bowel obstruction due to right lower quadrant adhesion. 2. Colonic diverticulosis. 3. Fatty midline hernias. Electronically Signed   By: Monte Fantasia M.D.   On: 08/08/2015 12:04   Dg Abd 2 Views  08/09/2015  CLINICAL DATA:  Small-bowel obstruction.  Abdominal pain. EXAM: ABDOMEN - 2 VIEW COMPARISON:  CT abdomen pelvis 08/08/2015 FINDINGS: Residual contrast in the colon from recent CT. Negative for bowel obstruction. There is contrast in the rectum. No ileus or air-fluid level in the bowel. Negative for free air. Surgical clips in the gallbladder fossa. Prior right hip replacement. No acute skeletal abnormality. No renal calculi. IMPRESSION: Normal bowel gas pattern. Contrast does reach the rectum from CT yesterday. Electronically Signed   By: Franchot Gallo M.D.   On: 08/09/2015 09:38    2D ECHO:   Disposition and Follow-up: Discharge Instructions    Discharge instructions    Complete by:  As directed   Please HOLD lasix and potassium for 24hours, can restart again on Sunday.  I added Norvasc 5mg  daily for high BP, please see your PCP in 10days for revaluation            DISPOSITION: Home   DISCHARGE FOLLOW-UP Follow-up Information    Follow up with [name], MD. Go on 08/19/2015.   Specialty:   Internal Medicine   Why:  for hospital follow-up@1 ;30pm   Contact information:   [place] [address] [phone]       Follow up with Minus Breeding, MD. Go on 08/26/2015.   Specialty:  Cardiology   Why:  @ 11;30am   Contact information:   [address] [place] [address] [phone]        Time spent on Discharge: 92mins   Signed:   RAI,RIPUDEEP M.D. [facility] 08/09/2015, 1:31 PM Pager: [phone]

## 2015-08-09 NOTE — Progress Notes (Signed)
ANTICOAGULATION CONSULT NOTE - Follow Up Consult  Pharmacy Consult for Heparin  Indication: atrial fibrillation  Allergies  Allergen Reactions  . Levothyroxine Shortness Of Breath and Other (See Comments)    Exhaustion also  . Statins Other (See Comments)    "Pain, weakness and kidney problems"  . Penicillins Other (See Comments)    dry mouth Has patient had a PCN reaction causing immediate rash, facial/tongue/throat swelling, SOB or lightheadedness with hypotension: No Has patient had a PCN reaction causing severe rash involving mucus membranes or skin necrosis: No Has patient had a PCN reaction that required hospitalization No Has patient had a PCN reaction occurring within the last 10 years: No If all of the above answers are "NO", then may proceed with Cephalosporin use.    Patient Measurements: Height: 5' (152.4 cm) Weight: 163 lb 5.8 oz (74.1 kg) (scale b) IBW/kg (Calculated) : 45.5  Vital Signs: Temp: 97.9 F (36.6 C) (01/06 0519) Temp Source: Oral (01/05 2301) BP: 175/105 mmHg (01/06 0519) Pulse Rate: 80 (01/05 2301)  Labs:  Recent Labs  08/08/15 0700 08/08/15 1455 08/08/15 1456 08/09/15 0458  HGB 14.2  --   --  13.7  HCT 44.3  --   --  42.5  PLT 194  --   --  175  APTT  --  33 30 113*  HEPARINUNFRC  --   --  1.14*  --   CREATININE 1.91*  --   --   --     Estimated Creatinine Clearance: 24.3 mL/min (by C-G formula based on Cr of 1.91).    Assessment: Supra-therapeutic aPTT, using aPTT to dose for now given Apixaban on anti-Xa level, no issues per RN.   Goal of Therapy:  Heparin level 0.3-0.7 units/ml aPTT 66-102 seconds Monitor platelets by anticoagulation protocol: Yes   Plan:  -Decrease heparin to 750 units/hr -1500 aPTT  Narda Bonds 08/09/2015,7:12 AM

## 2015-08-09 NOTE — Progress Notes (Signed)
Pt's BP 169/109 with machine and 160/108 manually.  Attempted to give hydralazine 10mg  iv as ordered.  Pt refused and says it will come down on its own.  Notified Dr. Tana Coast.  Will continue to monitor.  Karie Kirks, Therapist, sports.

## 2015-08-19 ENCOUNTER — Ambulatory Visit (INDEPENDENT_AMBULATORY_CARE_PROVIDER_SITE_OTHER): Payer: Medicare Other | Admitting: Internal Medicine

## 2015-08-19 ENCOUNTER — Encounter: Payer: Self-pay | Admitting: Internal Medicine

## 2015-08-19 VITALS — BP 162/100 | HR 80 | Temp 98.3°F | Resp 16 | Ht 60.0 in | Wt 160.0 lb

## 2015-08-19 DIAGNOSIS — E038 Other specified hypothyroidism: Secondary | ICD-10-CM

## 2015-08-19 DIAGNOSIS — R0600 Dyspnea, unspecified: Secondary | ICD-10-CM

## 2015-08-19 DIAGNOSIS — I10 Essential (primary) hypertension: Secondary | ICD-10-CM | POA: Diagnosis not present

## 2015-08-19 DIAGNOSIS — I4891 Unspecified atrial fibrillation: Secondary | ICD-10-CM

## 2015-08-19 NOTE — Progress Notes (Signed)
Pre visit review using our clinic review tool, if applicable. No additional management support is needed unless otherwise documented below in the visit note. 

## 2015-08-19 NOTE — Patient Instructions (Signed)
I will send a message to Dr. Percival Spanish and Dr. [name] about your upcoming visit and the kidney function.   We will see what the results of that right heart catheterization show.

## 2015-08-20 ENCOUNTER — Encounter: Payer: Self-pay | Admitting: Internal Medicine

## 2015-08-20 NOTE — Assessment & Plan Note (Signed)
She is taking lasix 40 mg every other day and this does not seem to help with her breathing. It does affect her leg swelling and BNP was elevated off lasix.

## 2015-08-20 NOTE — Progress Notes (Signed)
   Subjective:    Patient ID: Tiffany [name], female    DOB: May 14, 1946, 70 y.o.   MRN: [id number]  HPI The patient is a 70 YO female coming in for hospital follow up (in for small bowel obstruction, managed non-operatively). She is doing well in that regard since leaving the hospital. She is moving her bowels normally and not having pain. She is still very concerned about her shortness of breath. After highly positive ANA she went to rheumatology and they arranged for her to have right heart cath for checking for pulmonary hypertension. She is a little nervous about this. She has visit scheduled to talk to a cardiologist about this later this month. Still with severe SOB with strenuous activity such as climbing stairs. Recent stress test without ischemic findings. When talking she is able to talk without getting winded.  Review of Systems  Constitutional: Positive for activity change and fatigue. Negative for chills, appetite change and unexpected weight change.  HENT: Negative for congestion, ear pain, postnasal drip, rhinorrhea, sinus pressure and sore throat.   Respiratory: Positive for shortness of breath. Negative for cough, chest tightness and wheezing.   Cardiovascular: Negative for chest pain, palpitations and leg swelling.  Gastrointestinal: Negative for nausea, abdominal pain, diarrhea, constipation and abdominal distention.  Endocrine: Negative for cold intolerance and heat intolerance.  Musculoskeletal: Negative.   Skin: Negative.   Neurological: Negative for dizziness, seizures, syncope, speech difficulty, weakness, light-headedness and headaches.      Objective:   Physical Exam  Constitutional: She is oriented to person, place, and time. She appears well-developed and well-nourished.  HENT:  Head: Normocephalic and atraumatic.  Eyes: EOM are normal.  Neck: Normal range of motion.  Cardiovascular: Normal rate and regular rhythm.   Appears regular  Pulmonary/Chest: Effort  normal and breath sounds normal.  Able to talk without taking breaths in the office.   Abdominal: Soft. She exhibits no distension. There is no tenderness. There is no rebound.  Musculoskeletal: She exhibits no edema.  Neurological: She is alert and oriented to person, place, and time. Coordination normal.  Skin: Skin is warm and dry.  Psychiatric: She has a normal mood and affect.   Filed Vitals:   08/19/15 1329  BP: 162/100  Pulse: 80  Temp: 98.3 F (36.8 C)  TempSrc: Oral  Resp: 16  Height: 5' (1.524 m)  Weight: 160 lb (72.576 kg)      Assessment & Plan:

## 2015-08-20 NOTE — Assessment & Plan Note (Signed)
Still in sinus on exam today, off amiodarone for several months now.

## 2015-08-20 NOTE — Assessment & Plan Note (Signed)
Reviewed with her today that although her TSH is slightly elevated her free T4 is normal and no reason to retry levothyroxine at this time. Do not think it is related to her energy and SOB.

## 2015-08-20 NOTE — Assessment & Plan Note (Signed)
Will route note to her cardiologist as well as the cardiologist she is scheduled with for later this month for right heart cath.

## 2015-08-25 NOTE — Progress Notes (Signed)
Cardiology Office Note   Date:  08/26/2015   ID:  Tiffany [name], DOB 1946-06-30, MRN [id number]  PCP:  Tiffany Koch, MD  Cardiologist:   Minus Breeding, MD   Chief Complaint  Patient presents with  . Atrial Fibrillation      History of Present Illness: Tiffany [name] is a 70 y.o. female who presents for follow up of dyspnea.  She has had  near syncope. This was thought to be related to bradycardia in the past and her beta blocker was discontinued.   At the last visit she was complaining of dyspnea.  She wanted to come off of some of her meds so we stopped amiodarone.  She was hospitalized a few weeks ago with partial SBO.  I reviewed these records.  She did have a work up by Tiffany Koch, MD in December and had an elevated BNP.  She does have a mildly reduced EF.  Of note Lexiscan Myoview in Sept of last year was negative for ischemia.  She was scheduled to see Dr. Marigene [name] for office visit to set her up for right heart catheterization.  He continues to have some dyspnea with exertion. She notices it with things like household activities. She's not having any new PND or orthopnea. She's not having any new palpitations, presyncope or syncope. She has had no weight gain. She takes diuretics every other day for lower edema.  Past Medical History  Diagnosis Date  . URINARY INCONTINENCE 04-20-12    occ. with nighttime sleep pattern  . BENIGN POSITIONAL VERTIGO   . GERD   . DYSLIPIDEMIA   . Raynaud disease   . Complication of anesthesia 04-20-12    some issues with prolonged sedation after anesthesia  . Arthritis 04-20-12    Osteoarthritis-right hip  . HYPERTENSION     a. severe, pt intol of med tx, Pt. has severe "whitecoat" syndrome and refused medical therapy.  . Osteopenia   . Cataract   . Esophageal stricture   . Diverticulosis   . Hypothyroidism 09/29/2014    a. pt did not tolerate synthroid and this was subsequently discontinued.  . Atrial fibrillation status  post cardioversion, 01/30/15 maintaining SR.  01/31/2015    a. 01/2015 s/p TEE/DCCV;  b. 02/06/2015 recurrent AF noted, amio added;  c. CHA2DS2VASc = 3-->eliquis.  . Mild LV dysfunction     a. 01/2015 Echo: EF 45-50%, mild MR, mildly dil LA, mod dil RV with mod to sev reduced fxn, mod-sev TR, PASP 30mmHg.    Past Surgical History  Procedure Laterality Date  . Nasal fracture surgery  2006  . Tubal ligation  1980  . Cholecystectomy      '90-"sludge"  . Total hip arthroplasty  04/26/2012    Procedure: TOTAL HIP ARTHROPLASTY ANTERIOR APPROACH;  Surgeon: Mauri Pole, MD;  Location: [facility];  Service: Orthopedics;  Laterality: Right;  . Breast ultrasound Right 09/13/13    There is no sonographic evidence of malignancy. the 123XX123 complicated cyst in (R) breast is consistent with a benign finding. repeat in 1 year  . Tee without cardioversion N/A 01/30/2015    Procedure: TRANSESOPHAGEAL ECHOCARDIOGRAM (TEE);  Surgeon: Sanda Klein, MD;  Location: [facility];  Service: Cardiovascular;  Laterality: N/A;  . Cardioversion N/A 01/30/2015    Procedure: CARDIOVERSION;  Surgeon: Sanda Klein, MD;  Location: [facility];  Service: Cardiovascular;  Laterality: N/A;     Current Outpatient Prescriptions  Medication Sig Dispense Refill  . acetaminophen (TYLENOL)  325 MG tablet Take 2 tablets (650 mg total) by mouth every 4 (four) hours as needed for headache or mild pain.    Marland Kitchen amLODipine (NORVASC) 5 MG tablet Take 1 tablet (5 mg total) by mouth daily. 30 tablet 3  . Calcium Carb-Cholecalciferol (CALCIUM 600 + D PO) Take 1 tablet by mouth daily.    . diazepam (VALIUM) 5 MG tablet Take 1 tablet (5 mg total) by mouth every 8 (eight) hours as needed for anxiety. 30 tablet 0  . ELIQUIS 5 MG TABS tablet TAKE 1 TABLET BY MOUTH TWICE A DAY 60 tablet 5  . estradiol (ESTRACE VAGINAL) 0.1 MG/GM vaginal cream Place 1 Applicatorful vaginally once a week. 1 gram once weekly (Patient taking differently: Place 1  Applicatorful vaginally once a week. 1 gram once weekly on Sat or sun) 42.5 g 1  . famotidine (PEPCID AC) 10 MG tablet Take 1 tablet (10 mg total) by mouth daily.    . fluticasone (FLONASE) 50 MCG/ACT nasal spray Place 1 spray into both nostrils daily as needed for allergies or rhinitis. 16 g 2  . furosemide (LASIX) 40 MG tablet Take 40 mg by mouth every other day.   0  . KLOR-CON M20 20 MEQ tablet Take 20 mEq by mouth every other day. Take with furosemide  11  . miconazole (MICOTIN) 2 % powder Apply topically as needed for itching. (Patient taking differently: Apply 1 application topically daily. ) 70 g 0  . Multiple Vitamins-Minerals (CENTRUM SILVER) tablet Take 1 tablet by mouth daily.     . Omega 3 1000 MG CAPS Take 2,000 mg by mouth daily.     No current facility-administered medications for this visit.    Allergies:   Levothyroxine; Statins; and Penicillins   ROS:  Please see the history of present illness.   Otherwise, review of systems are positive for none.   All other systems are reviewed and negative.    PHYSICAL EXAM: VS:  BP 138/98 mmHg  Pulse 90  Ht 5' (1.524 m)  Wt 160 lb (72.576 kg)  BMI 31.25 kg/m2 , BMI Body mass index is 31.25 kg/(m^2). GENERAL:  Well appearing HEENT:  Pupils equal round and reactive, fundi not visualized, oral mucosa unremarkable NECK:  Mild HJR , waveform within normal limits, carotid upstroke brisk and symmetric, no bruits, no thyromegaly LYMPHATICS:  No cervical, inguinal adenopathy LUNGS:  Clear to auscultation bilaterally BACK:  No CVA tenderness CHEST:  Unremarkable HEART:  PMI not displaced or sustained,S1 and S2 within normal limits, no S3, no S4, no clicks, no rubs, no murmurs ABD:  Flat, positive bowel sounds normal in frequency in pitch, no bruits, no rebound, no guarding, no midline pulsatile mass, no hepatomegaly, no splenomegaly EXT:  2 plus pulses throughout, no edema, no cyanosis no clubbing    Recent Labs: 04/14/2015: B  Natriuretic Peptide 849.6* 07/15/2015: Pro B Natriuretic peptide (BNP) 1187.0*; TSH 7.81* 08/08/2015: ALT 30; BUN 25*; Creatinine, Ser 1.91*; Magnesium 1.9; Potassium 4.4; Sodium 140 08/09/2015: Hemoglobin 13.7; Platelets 175    Lipid Panel    Component Value Date/Time   CHOL 237* 09/28/2014 0855   TRIG 150.0* 09/28/2014 0855   TRIG 185 02/11/2009   HDL 59.00 09/28/2014 0855   CHOLHDL 4 09/28/2014 0855   VLDL 30.0 09/28/2014 0855   LDLCALC 148* 09/28/2014 0855      Wt Readings from Last 3 Encounters:  08/26/15 160 lb (72.576 kg)  08/19/15 160 lb (72.576 kg)  08/09/15 163 lb  5.8 oz (74.1 kg)      Other studies Reviewed: Additional studies/ records that were reviewed today include: Hospital records, labs and Tiffany Koch, MD records Review of the above records demonstrates:  See above    ASSESSMENT AND PLAN:  DYSPNEA:  This could be related to amiodarone.  Recent PFTs which did not exclude COPD.   She does have a mildly reduced EF and no evidence of ischemia on perfusion study. She had some TR with perhaps some mild pulmonary hypertension. I think it is reasonable to get a right heart catheterization. I will change her appointment to Tiffany [name] and instead set her up for him to do a right heart catheterization and then we can review these data. She will likely need pulmonary consultation.   HTN:  She reports her blood pressure is currently much better controlled. No change in therapy is indicated.  ATRIAL FIB:  She has had no symptomatic recurrence of this. she can offer eloquence for 2 days prior to the catheterization. I will check a CBC.   Current medicines are reviewed at length with the patient today.  The patient does not have concerns regarding medicines.  The following changes have been made:  no change  Labs/ tests ordered today include:  No orders of the defined types were placed in this encounter.    Disposition:   FU with me in after the right heart  cath.    Signed, Minus Breeding, MD  08/26/2015 12:06 PM    [place]

## 2015-08-26 ENCOUNTER — Other Ambulatory Visit: Payer: Self-pay | Admitting: Cardiology

## 2015-08-26 ENCOUNTER — Encounter: Payer: Self-pay | Admitting: Cardiology

## 2015-08-26 ENCOUNTER — Ambulatory Visit (INDEPENDENT_AMBULATORY_CARE_PROVIDER_SITE_OTHER): Payer: Medicare Other | Admitting: Cardiology

## 2015-08-26 VITALS — BP 138/98 | HR 90 | Ht 60.0 in | Wt 160.0 lb

## 2015-08-26 DIAGNOSIS — I272 Other secondary pulmonary hypertension: Secondary | ICD-10-CM

## 2015-08-26 DIAGNOSIS — R0600 Dyspnea, unspecified: Secondary | ICD-10-CM | POA: Diagnosis not present

## 2015-08-26 LAB — CBC
HCT: 48 % — ABNORMAL HIGH (ref 36.0–46.0)
Hemoglobin: 16 g/dL — ABNORMAL HIGH (ref 12.0–15.0)
MCH: 31.7 pg (ref 26.0–34.0)
MCHC: 33.3 g/dL (ref 30.0–36.0)
MCV: 95 fL (ref 78.0–100.0)
MPV: 11.1 fL (ref 8.6–12.4)
Platelets: 239 10*3/uL (ref 150–400)
RBC: 5.05 MIL/uL (ref 3.87–5.11)
RDW: 15.6 % — ABNORMAL HIGH (ref 11.5–15.5)
WBC: 8 10*3/uL (ref 4.0–10.5)

## 2015-08-26 NOTE — Telephone Encounter (Signed)
Rx request sent to pharmacy.  

## 2015-08-26 NOTE — Patient Instructions (Signed)
Your physician has requested that you have a right cardiac catheterization with Dr [name]. Cardiac catheterization is used to diagnose and/or treat various heart conditions. Doctors may recommend this procedure for a number of different reasons. The most common reason is to evaluate chest pain. Chest pain can be a symptom of coronary artery disease (CAD), and cardiac catheterization can show whether plaque is narrowing or blocking your heart's arteries. This procedure is also used to evaluate the valves, as well as measure the blood flow and oxygen levels in different parts of your heart. For further information please visit [email or web address].   Following your catheterization, you will not be allowed to drive for 3 days.  No lifting, pushing, or pulling greater that 10 pounds is allowed for 1 week.  You will be required to have the following tests prior to the procedure:  1. Blood work-the blood work can be done no more than 7 days prior to the procedure.  It can be done at any [facility] lab.  There is one downstairs on the first floor of this building and one in the [facility] building [phone]).  **Please STOP ELIQUIS 2 days prior to your procedure. Dr [name] will tell you when you can restart this medication.

## 2015-08-29 ENCOUNTER — Ambulatory Visit (HOSPITAL_COMMUNITY)
Admission: RE | Admit: 2015-08-29 | Discharge: 2015-08-29 | Disposition: A | Discharge status: Home / Self Care | Payer: Medicare Other | Source: Ambulatory Visit | Attending: Cardiology | Admitting: Cardiology

## 2015-08-29 ENCOUNTER — Encounter (HOSPITAL_COMMUNITY): Payer: Medicare Other

## 2015-08-29 ENCOUNTER — Encounter (HOSPITAL_COMMUNITY): Payer: Self-pay | Admitting: Cardiology

## 2015-08-29 ENCOUNTER — Encounter (HOSPITAL_COMMUNITY)
Admission: RE | Disposition: A | Discharge status: Home / Self Care | Payer: Medicare Other | Source: Ambulatory Visit | Attending: Cardiology

## 2015-08-29 DIAGNOSIS — I110 Hypertensive heart disease with heart failure: Secondary | ICD-10-CM | POA: Diagnosis not present

## 2015-08-29 DIAGNOSIS — K219 Gastro-esophageal reflux disease without esophagitis: Secondary | ICD-10-CM | POA: Insufficient documentation

## 2015-08-29 DIAGNOSIS — Z880 Allergy status to penicillin: Secondary | ICD-10-CM | POA: Insufficient documentation

## 2015-08-29 DIAGNOSIS — I4891 Unspecified atrial fibrillation: Secondary | ICD-10-CM | POA: Diagnosis not present

## 2015-08-29 DIAGNOSIS — K5790 Diverticulosis of intestine, part unspecified, without perforation or abscess without bleeding: Secondary | ICD-10-CM | POA: Insufficient documentation

## 2015-08-29 DIAGNOSIS — I7300 Raynaud's syndrome without gangrene: Secondary | ICD-10-CM | POA: Diagnosis not present

## 2015-08-29 DIAGNOSIS — E785 Hyperlipidemia, unspecified: Secondary | ICD-10-CM | POA: Insufficient documentation

## 2015-08-29 DIAGNOSIS — M1611 Unilateral primary osteoarthritis, right hip: Secondary | ICD-10-CM | POA: Insufficient documentation

## 2015-08-29 DIAGNOSIS — Z7901 Long term (current) use of anticoagulants: Secondary | ICD-10-CM | POA: Insufficient documentation

## 2015-08-29 DIAGNOSIS — E039 Hypothyroidism, unspecified: Secondary | ICD-10-CM | POA: Diagnosis not present

## 2015-08-29 DIAGNOSIS — M8580 Other specified disorders of bone density and structure, unspecified site: Secondary | ICD-10-CM | POA: Diagnosis not present

## 2015-08-29 DIAGNOSIS — I509 Heart failure, unspecified: Secondary | ICD-10-CM | POA: Insufficient documentation

## 2015-08-29 DIAGNOSIS — R55 Syncope and collapse: Secondary | ICD-10-CM | POA: Insufficient documentation

## 2015-08-29 DIAGNOSIS — I272 Other secondary pulmonary hypertension: Secondary | ICD-10-CM | POA: Insufficient documentation

## 2015-08-29 DIAGNOSIS — I270 Primary pulmonary hypertension: Secondary | ICD-10-CM | POA: Diagnosis not present

## 2015-08-29 HISTORY — PX: CARDIAC CATHETERIZATION: SHX172

## 2015-08-29 LAB — POCT I-STAT 3, ART BLOOD GAS (G3+)
Acid-base deficit: 1 mmol/L (ref 0.0–2.0)
Acid-base deficit: 2 mmol/L (ref 0.0–2.0)
Bicarbonate: 22.3 mEq/L (ref 20.0–24.0)
Bicarbonate: 22.9 mEq/L (ref 20.0–24.0)
O2 Saturation: 53 %
O2 Saturation: 68 %
TCO2: 23 mmol/L (ref 0–100)
TCO2: 24 mmol/L (ref 0–100)
pCO2 arterial: 34.4 mmHg — ABNORMAL LOW (ref 35.0–45.0)
pCO2 arterial: 34.7 mmHg — ABNORMAL LOW (ref 35.0–45.0)
pH, Arterial: 7.415 (ref 7.350–7.450)
pH, Arterial: 7.431 (ref 7.350–7.450)
pO2, Arterial: 27 mmHg — CL (ref 80.0–100.0)
pO2, Arterial: 35 mmHg — CL (ref 80.0–100.0)

## 2015-08-29 LAB — BASIC METABOLIC PANEL
Anion gap: 13 (ref 5–15)
BUN: 25 mg/dL — ABNORMAL HIGH (ref 6–20)
CO2: 21 mmol/L — ABNORMAL LOW (ref 22–32)
Calcium: 8.8 mg/dL — ABNORMAL LOW (ref 8.9–10.3)
Chloride: 107 mmol/L (ref 101–111)
Creatinine, Ser: 1.72 mg/dL — ABNORMAL HIGH (ref 0.44–1.00)
GFR calc Af Amer: 33 mL/min — ABNORMAL LOW (ref >60)
GFR calc non Af Amer: 29 mL/min — ABNORMAL LOW (ref >60)
Glucose, Bld: 94 mg/dL (ref 65–99)
Potassium: 4.1 mmol/L (ref 3.5–5.1)
Sodium: 141 mmol/L (ref 135–145)

## 2015-08-29 SURGERY — RIGHT HEART CATH

## 2015-08-29 MED ORDER — SODIUM CHLORIDE 0.9 % IV SOLN: 250.0000 mL | INTRAVENOUS | Status: DC | PRN

## 2015-08-29 MED ORDER — SODIUM CHLORIDE 0.9 % IV SOLN
INTRAVENOUS | Status: DC
  Administered 2015-08-29: 1000 mL via INTRAVENOUS

## 2015-08-29 MED ORDER — ONDANSETRON HCL 4 MG/2ML IJ SOLN: 4.0000 mg | Freq: Four times a day (QID) | INTRAMUSCULAR | Status: DC | PRN

## 2015-08-29 MED ORDER — SODIUM CHLORIDE 0.9% FLUSH: 3.0000 mL | INTRAVENOUS | Status: DC | PRN

## 2015-08-29 MED ORDER — ACETAMINOPHEN 325 MG PO TABS: 650.0000 mg | ORAL_TABLET | ORAL | Status: DC | PRN

## 2015-08-29 MED ORDER — SODIUM CHLORIDE 0.9% FLUSH: 3.0000 mL | Freq: Two times a day (BID) | INTRAVENOUS | Status: DC

## 2015-08-29 MED ORDER — FENTANYL CITRATE (PF) 100 MCG/2ML IJ SOLN
INTRAMUSCULAR | Status: DC | PRN
  Administered 2015-08-29 (×2): 25 ug via INTRAVENOUS

## 2015-08-29 MED ORDER — MIDAZOLAM HCL 2 MG/2ML IJ SOLN
INTRAMUSCULAR | Status: AC
  Filled 2015-08-29: qty 2

## 2015-08-29 MED ORDER — HEPARIN (PORCINE) IN NACL 2-0.9 UNIT/ML-% IJ SOLN
INTRAMUSCULAR | Status: AC
  Filled 2015-08-29: qty 500

## 2015-08-29 MED ORDER — LIDOCAINE HCL (PF) 1 % IJ SOLN
INTRAMUSCULAR | Status: AC
  Filled 2015-08-29: qty 30

## 2015-08-29 MED ORDER — MIDAZOLAM HCL 2 MG/2ML IJ SOLN
INTRAMUSCULAR | Status: DC | PRN
  Administered 2015-08-29: 1 mg via INTRAVENOUS

## 2015-08-29 MED ORDER — FENTANYL CITRATE (PF) 100 MCG/2ML IJ SOLN
INTRAMUSCULAR | Status: AC
  Filled 2015-08-29: qty 2

## 2015-08-29 SURGICAL SUPPLY — 8 items
CATH SWAN GANZ 7F STRAIGHT (CATHETERS) ×1 IMPLANT
GUIDEWIRE .025 260CM (WIRE) ×1 IMPLANT
KIT HEART RIGHT NAMIC (KITS) ×2 IMPLANT
PACK CARDIAC CATHETERIZATION (CUSTOM PROCEDURE TRAY) ×2 IMPLANT
PROTECTION STATION PRESSURIZED (MISCELLANEOUS) ×2
SHEATH PINNACLE 7F 10CM (SHEATH) ×1 IMPLANT
STATION PROTECTION PRESSURIZED (MISCELLANEOUS) IMPLANT
TRANSDUCER W/STOPCOCK (MISCELLANEOUS) ×3 IMPLANT

## 2015-08-29 NOTE — Interval H&P Note (Signed)
History and Physical Interval Note:  08/29/2015 8:02 AM  Tiffany [name]  has presented today for surgery, with the diagnosis of hf  The various methods of treatment have been discussed with the patient and family. After consideration of risks, benefits and other options for treatment, the patient has consented to  Procedure(s): Right Heart Cath (N/A) as a surgical intervention .  The patient's history has been reviewed, patient examined, no change in status, stable for surgery.  I have reviewed the patient's chart and labs.  Questions were answered to the patient's satisfaction.     Felina Tello [employer]

## 2015-08-29 NOTE — H&P (View-Only) (Signed)
Cardiology Office Note   Date:  08/26/2015   ID:  Tiffany [name], DOB 1945/10/16, MRN [id number]  PCP:  [name], MD  Cardiologist:   Minus Breeding, MD   Chief Complaint  Patient presents with  . Atrial Fibrillation      History of Present Illness: Tiffany [name] is a 70 y.o. female who presents for follow up of dyspnea.  She has had  near syncope. This was thought to be related to bradycardia in the past and her beta blocker was discontinued.   At the last visit she was complaining of dyspnea.  She wanted to come off of some of her meds so we stopped amiodarone.  She was hospitalized a few weeks ago with partial SBO.  I reviewed these records.  She did have a work up by [name], MD in December and had an elevated BNP.  She does have a mildly reduced EF.  Of note Lexiscan Myoview in Sept of last year was negative for ischemia.  She was scheduled to see Dr. Marigene Ehlers for office visit to set her up for right heart catheterization.  He continues to have some dyspnea with exertion. She notices it with things like household activities. She's not having any new PND or orthopnea. She's not having any new palpitations, presyncope or syncope. She has had no weight gain. She takes diuretics every other day for lower edema.  Past Medical History  Diagnosis Date  . URINARY INCONTINENCE 04-20-12    occ. with nighttime sleep pattern  . BENIGN POSITIONAL VERTIGO   . GERD   . DYSLIPIDEMIA   . Raynaud disease   . Complication of anesthesia 04-20-12    some issues with prolonged sedation after anesthesia  . Arthritis 04-20-12    Osteoarthritis-right hip  . HYPERTENSION     a. severe, pt intol of med tx, Pt. has severe "whitecoat" syndrome and refused medical therapy.  . Osteopenia   . Cataract   . Esophageal stricture   . Diverticulosis   . Hypothyroidism 09/29/2014    a. pt did not tolerate synthroid and this was subsequently discontinued.  . Atrial fibrillation status  post cardioversion, 01/30/15 maintaining SR.  01/31/2015    a. 01/2015 s/p TEE/DCCV;  b. 02/06/2015 recurrent AF noted, amio added;  c. CHA2DS2VASc = 3-->eliquis.  . Mild LV dysfunction     a. 01/2015 Echo: EF 45-50%, mild MR, mildly dil LA, mod dil RV with mod to sev reduced fxn, mod-sev TR, PASP 69mmHg.    Past Surgical History  Procedure Laterality Date  . Nasal fracture surgery  2006  . Tubal ligation  1980  . Cholecystectomy      '90-"sludge"  . Total hip arthroplasty  04/26/2012    Procedure: TOTAL HIP ARTHROPLASTY ANTERIOR APPROACH;  Surgeon: Mauri Pole, MD;  Location: [facility];  Service: Orthopedics;  Laterality: Right;  . Breast ultrasound Right 09/13/13    There is no sonographic evidence of malignancy. the 123XX123 complicated cyst in (R) breast is consistent with a benign finding. repeat in 1 year  . Tee without cardioversion N/A 01/30/2015    Procedure: TRANSESOPHAGEAL ECHOCARDIOGRAM (TEE);  Surgeon: Sanda Klein, MD;  Location: [place];  Service: Cardiovascular;  Laterality: N/A;  . Cardioversion N/A 01/30/2015    Procedure: CARDIOVERSION;  Surgeon: Sanda Klein, MD;  Location: [facility];  Service: Cardiovascular;  Laterality: N/A;     Current Outpatient Prescriptions  Medication Sig Dispense Refill  . acetaminophen (TYLENOL)  325 MG tablet Take 2 tablets (650 mg total) by mouth every 4 (four) hours as needed for headache or mild pain.    Marland Kitchen amLODipine (NORVASC) 5 MG tablet Take 1 tablet (5 mg total) by mouth daily. 30 tablet 3  . Calcium Carb-Cholecalciferol (CALCIUM 600 + D PO) Take 1 tablet by mouth daily.    . diazepam (VALIUM) 5 MG tablet Take 1 tablet (5 mg total) by mouth every 8 (eight) hours as needed for anxiety. 30 tablet 0  . ELIQUIS 5 MG TABS tablet TAKE 1 TABLET BY MOUTH TWICE A DAY 60 tablet 5  . estradiol (ESTRACE VAGINAL) 0.1 MG/GM vaginal cream Place 1 Applicatorful vaginally once a week. 1 gram once weekly (Patient taking differently: Place 1  Applicatorful vaginally once a week. 1 gram once weekly on Sat or sun) 42.5 g 1  . famotidine (PEPCID AC) 10 MG tablet Take 1 tablet (10 mg total) by mouth daily.    . fluticasone (FLONASE) 50 MCG/ACT nasal spray Place 1 spray into both nostrils daily as needed for allergies or rhinitis. 16 g 2  . furosemide (LASIX) 40 MG tablet Take 40 mg by mouth every other day.   0  . KLOR-CON M20 20 MEQ tablet Take 20 mEq by mouth every other day. Take with furosemide  11  . miconazole (MICOTIN) 2 % powder Apply topically as needed for itching. (Patient taking differently: Apply 1 application topically daily. ) 70 g 0  . Multiple Vitamins-Minerals (CENTRUM SILVER) tablet Take 1 tablet by mouth daily.     . Omega 3 1000 MG CAPS Take 2,000 mg by mouth daily.     No current facility-administered medications for this visit.    Allergies:   Levothyroxine; Statins; and Penicillins   ROS:  Please see the history of present illness.   Otherwise, review of systems are positive for none.   All other systems are reviewed and negative.    PHYSICAL EXAM: VS:  BP 138/98 mmHg  Pulse 90  Ht 5' (1.524 m)  Wt 160 lb (72.576 kg)  BMI 31.25 kg/m2 , BMI Body mass index is 31.25 kg/(m^2). GENERAL:  Well appearing HEENT:  Pupils equal round and reactive, fundi not visualized, oral mucosa unremarkable NECK:  Mild HJR , waveform within normal limits, carotid upstroke brisk and symmetric, no bruits, no thyromegaly LYMPHATICS:  No cervical, inguinal adenopathy LUNGS:  Clear to auscultation bilaterally BACK:  No CVA tenderness CHEST:  Unremarkable HEART:  PMI not displaced or sustained,S1 and S2 within normal limits, no S3, no S4, no clicks, no rubs, no murmurs ABD:  Flat, positive bowel sounds normal in frequency in pitch, no bruits, no rebound, no guarding, no midline pulsatile mass, no hepatomegaly, no splenomegaly EXT:  2 plus pulses throughout, no edema, no cyanosis no clubbing    Recent Labs: 04/14/2015: B  Natriuretic Peptide 849.6* 07/15/2015: Pro B Natriuretic peptide (BNP) 1187.0*; TSH 7.81* 08/08/2015: ALT 30; BUN 25*; Creatinine, Ser 1.91*; Magnesium 1.9; Potassium 4.4; Sodium 140 08/09/2015: Hemoglobin 13.7; Platelets 175    Lipid Panel    Component Value Date/Time   CHOL 237* 09/28/2014 0855   TRIG 150.0* 09/28/2014 0855   TRIG 185 02/11/2009   HDL 59.00 09/28/2014 0855   CHOLHDL 4 09/28/2014 0855   VLDL 30.0 09/28/2014 0855   LDLCALC 148* 09/28/2014 0855      Wt Readings from Last 3 Encounters:  08/26/15 160 lb (72.576 kg)  08/19/15 160 lb (72.576 kg)  08/09/15 163 lb  5.8 oz (74.1 kg)      Other studies Reviewed: Additional studies/ records that were reviewed today include: Hospital records, labs and [name], MD records Review of the above records demonstrates:  See above    ASSESSMENT AND PLAN:  DYSPNEA:  This could be related to amiodarone.  Recent PFTs which did not exclude COPD.   She does have a mildly reduced EF and no evidence of ischemia on perfusion study. She had some TR with perhaps some mild pulmonary hypertension. I think it is reasonable to get a right heart catheterization. I will change her appointment to Dr. [name] and instead set her up for him to do a right heart catheterization and then we can review these data. She will likely need pulmonary consultation.   HTN:  She reports her blood pressure is currently much better controlled. No change in therapy is indicated.  ATRIAL FIB:  She has had no symptomatic recurrence of this. she can offer eloquence for 2 days prior to the catheterization. I will check a CBC.   Current medicines are reviewed at length with the patient today.  The patient does not have concerns regarding medicines.  The following changes have been made:  no change  Labs/ tests ordered today include:  No orders of the defined types were placed in this encounter.    Disposition:   FU with me in after the right heart  cath.    Signed, Minus Breeding, MD  08/26/2015 12:06 PM    [place]

## 2015-08-29 NOTE — Progress Notes (Addendum)
Site area: rt groin fv sheath Site Prior to Removal:  Level 0 Pressure Applied For:  15 minutes Manual:   yes Patient Status During Pull:  stable Post Pull Site:  Level  0 Post Pull Instructions Given:  yes Post Pull Pulses Present: yes Dressing Applied:  Small tegaderm Bedrest begins @ 0910 Comments: IV saline locked

## 2015-08-29 NOTE — Discharge Instructions (Signed)
Angiogram, Care After Refer to this sheet in the next few weeks. These instructions provide you with information about caring for yourself after your procedure. Your health care provider may also give you more specific instructions. Your treatment has been planned according to current medical practices, but problems sometimes occur. Call your health care provider if you have any problems or questions after your procedure. WHAT TO EXPECT AFTER THE PROCEDURE After your procedure, it is typical to have the following:  Bruising at the catheter insertion site that usually fades within 1-2 weeks.  Blood collecting in the tissue (hematoma) that may be painful to the touch. It should usually decrease in size and tenderness within 1-2 weeks. HOME CARE INSTRUCTIONS  Take medicines only as directed by your health care provider.  You may shower 24-48 hours after the procedure or as directed by your health care provider. Remove the bandage (dressing) and gently wash the site with plain soap and water. Pat the area dry with a clean towel. Do not rub the site, because this may cause bleeding.  Do not take baths, swim, or use a hot tub until your health care provider approves.  Check your insertion site every day for redness, swelling, or drainage.  Do not apply powder or lotion to the site.  Do not lift over 10 lb (4.5 kg) for 5 days after your procedure or as directed by your health care provider.  Ask your health care provider when it is okay to:  Return to work or school.  Resume usual physical activities or sports.  Resume sexual activity.  Do not drive home if you are discharged the same day as the procedure. Have someone else drive you.  You may drive 24 hours after the procedure unless otherwise instructed by your health care provider.  Do not operate machinery or power tools for 24 hours after the procedure or as directed by your health care provider.  If your procedure was done as an  outpatient procedure, which means that you went home the same day as your procedure, a responsible adult should be with you for the first 24 hours after you arrive home.  Keep all follow-up visits as directed by your health care provider. This is important. SEEK MEDICAL CARE IF:  You have a fever.  You have chills.  You have increased bleeding from the catheter insertion site. Hold pressure on the site and call the office. SEEK IMMEDIATE MEDICAL CARE IF:  You have unusual pain at the catheter insertion site.  You have redness, warmth, or swelling at the catheter insertion site.  You have drainage (other than a small amount of blood on the dressing) from the catheter insertion site.  The catheter insertion site is bleeding, and the bleeding does not stop after 30 minutes of holding steady pressure on the site.  The area near or just beyond the catheter insertion site becomes pale, cool, tingly, or numb.   This information is not intended to replace advice given to you by your health care provider. Make sure you discuss any questions you have with your health care provider.   Document Released: 02/05/2005 Document Revised: 08/10/2014 Document Reviewed: 12/21/2012 Elsevier Interactive Patient Education [employer].

## 2015-08-30 MED FILL — Heparin Sodium (Porcine) 2 Unit/ML in Sodium Chloride 0.9%: INTRAMUSCULAR | Qty: 500 | Status: AC

## 2015-08-30 MED FILL — Lidocaine HCl Local Preservative Free (PF) Inj 1%: INTRAMUSCULAR | Qty: 30 | Status: AC

## 2015-09-04 ENCOUNTER — Telehealth: Payer: Self-pay | Admitting: Cardiology

## 2015-09-04 DIAGNOSIS — I5022 Chronic systolic (congestive) heart failure: Secondary | ICD-10-CM

## 2015-09-04 NOTE — Telephone Encounter (Signed)
New Message  Pt c/o of dizziness- stated it may be due to inc in Lasix from recent cath. Please call back and discuss.

## 2015-09-04 NOTE — Telephone Encounter (Signed)
Would cut amlodipine to 5 mg daily from 10 mg daily.

## 2015-09-04 NOTE — Telephone Encounter (Signed)
Returned call to patient.She stated since she increased lasix last week after right heart cath to 40 mg am,20 mg pm she has noticed when she is up moving around she is dizzy.Last Thursday 08/29/15 she weighed 155 lbs.Today she weighs 153 lbs 5 oz.Stated lasix increased by Dr.McLean.Message sent to [place] for advice.

## 2015-09-05 NOTE — Telephone Encounter (Signed)
Left message to call back  

## 2015-09-06 ENCOUNTER — Telehealth: Payer: Self-pay | Admitting: *Deleted

## 2015-09-06 ENCOUNTER — Other Ambulatory Visit (INDEPENDENT_AMBULATORY_CARE_PROVIDER_SITE_OTHER): Payer: Medicare Other | Admitting: *Deleted

## 2015-09-06 DIAGNOSIS — I5022 Chronic systolic (congestive) heart failure: Secondary | ICD-10-CM | POA: Diagnosis not present

## 2015-09-06 LAB — BASIC METABOLIC PANEL
BUN: 28 mg/dL — ABNORMAL HIGH (ref 7–25)
CO2: 21 mmol/L (ref 20–31)
Calcium: 9.1 mg/dL (ref 8.6–10.4)
Chloride: 104 mmol/L (ref 98–110)
Creat: 1.78 mg/dL — ABNORMAL HIGH (ref 0.60–0.93)
Glucose, Bld: 100 mg/dL — ABNORMAL HIGH (ref 65–99)
Potassium: 5.4 mmol/L — ABNORMAL HIGH (ref 3.5–5.3)
Sodium: 140 mmol/L (ref 135–146)

## 2015-09-06 NOTE — Telephone Encounter (Signed)
Patient called back with additional concerns regarding increased Lasix and dizziness Patient reports dizziness is debilitating, unable to perform ADL's, afraid to drive etc  Patient drinks ~ 40 ozs a day Weight today 153 lbs  Patient reports she DOES NOT TAKE amlodipine, was started on this during most recent hospitalization for elevated BP Reports her BP always elevates while in a health care setting and normal as soon as she gets home  BP today 111/82 F/U 09/18/15  Please advise further

## 2015-09-06 NOTE — Telephone Encounter (Signed)
eliquis 5 mg is approved through 08-02-16.

## 2015-09-06 NOTE — Telephone Encounter (Signed)
Pt aware and voiced understanding Will have repeat labs done 2/3 @ [facility] Decrease Lasix to 40 mg daily

## 2015-09-06 NOTE — Addendum Note (Signed)
Addended by: Eulis Foster on: 09/06/2015 03:26 PM   Modules accepted: Orders

## 2015-09-06 NOTE — Telephone Encounter (Signed)
Get BMET.  Hold Lasix for 1 day.  Then stop afternoon Lasix dose so that she is only taking it 40 mg daily.

## 2015-09-10 ENCOUNTER — Other Ambulatory Visit (HOSPITAL_COMMUNITY): Payer: Self-pay | Admitting: *Deleted

## 2015-09-11 ENCOUNTER — Encounter (HOSPITAL_COMMUNITY): Payer: Medicare Other | Admitting: Internal Medicine

## 2015-09-18 ENCOUNTER — Encounter (HOSPITAL_COMMUNITY): Payer: Self-pay

## 2015-09-18 ENCOUNTER — Ambulatory Visit (HOSPITAL_COMMUNITY)
Admission: RE | Admit: 2015-09-18 | Discharge: 2015-09-18 | Disposition: A | Discharge status: Home / Self Care | Payer: Medicare Other | Source: Ambulatory Visit | Attending: Internal Medicine | Admitting: Internal Medicine

## 2015-09-18 VITALS — BP 132/78 | HR 63 | Wt 158.0 lb

## 2015-09-18 DIAGNOSIS — I272 Other secondary pulmonary hypertension: Secondary | ICD-10-CM | POA: Diagnosis not present

## 2015-09-18 DIAGNOSIS — I130 Hypertensive heart and chronic kidney disease with heart failure and stage 1 through stage 4 chronic kidney disease, or unspecified chronic kidney disease: Secondary | ICD-10-CM | POA: Diagnosis not present

## 2015-09-18 DIAGNOSIS — I7300 Raynaud's syndrome without gangrene: Secondary | ICD-10-CM | POA: Insufficient documentation

## 2015-09-18 DIAGNOSIS — R0600 Dyspnea, unspecified: Secondary | ICD-10-CM

## 2015-09-18 DIAGNOSIS — E039 Hypothyroidism, unspecified: Secondary | ICD-10-CM | POA: Insufficient documentation

## 2015-09-18 DIAGNOSIS — I4891 Unspecified atrial fibrillation: Secondary | ICD-10-CM | POA: Diagnosis not present

## 2015-09-18 DIAGNOSIS — N183 Chronic kidney disease, stage 3 unspecified: Secondary | ICD-10-CM

## 2015-09-18 DIAGNOSIS — I5032 Chronic diastolic (congestive) heart failure: Secondary | ICD-10-CM | POA: Diagnosis not present

## 2015-09-18 DIAGNOSIS — Z79899 Other long term (current) drug therapy: Secondary | ICD-10-CM | POA: Insufficient documentation

## 2015-09-18 DIAGNOSIS — I480 Paroxysmal atrial fibrillation: Secondary | ICD-10-CM | POA: Diagnosis not present

## 2015-09-18 DIAGNOSIS — Z7902 Long term (current) use of antithrombotics/antiplatelets: Secondary | ICD-10-CM | POA: Insufficient documentation

## 2015-09-18 LAB — BASIC METABOLIC PANEL
Anion gap: 17 — ABNORMAL HIGH (ref 5–15)
BUN: 25 mg/dL — ABNORMAL HIGH (ref 6–20)
CO2: 18 mmol/L — ABNORMAL LOW (ref 22–32)
Calcium: 9.3 mg/dL (ref 8.9–10.3)
Chloride: 103 mmol/L (ref 101–111)
Creatinine, Ser: 1.83 mg/dL — ABNORMAL HIGH (ref 0.44–1.00)
GFR calc Af Amer: 31 mL/min — ABNORMAL LOW (ref >60)
GFR calc non Af Amer: 27 mL/min — ABNORMAL LOW (ref >60)
Glucose, Bld: 130 mg/dL — ABNORMAL HIGH (ref 65–99)
Potassium: 4.4 mmol/L (ref 3.5–5.1)
Sodium: 138 mmol/L (ref 135–145)

## 2015-09-18 LAB — BRAIN NATRIURETIC PEPTIDE: B Natriuretic Peptide: 1045.6 pg/mL — ABNORMAL HIGH (ref 0.0–100.0)

## 2015-09-18 MED ORDER — FUROSEMIDE 40 MG PO TABS: 40.0000 mg | ORAL_TABLET | Freq: Two times a day (BID) | ORAL | Status: DC

## 2015-09-18 NOTE — Patient Instructions (Signed)
Increase Furosemide (Lasix) to 40 mg Twice daily   Start Opsumit 10 mg daily, this has to come from a Psychologist, prison and probation services.  [employer] is the company who makes the medication and they will contact you soon to get the first shipment sent to you.  Labs today  VQ Scan and chest x-ray  Your physician recommends that you schedule a follow-up appointment in: 3 weeks

## 2015-09-19 DIAGNOSIS — N183 Chronic kidney disease, stage 3 unspecified: Secondary | ICD-10-CM | POA: Insufficient documentation

## 2015-09-19 DIAGNOSIS — I5032 Chronic diastolic (congestive) heart failure: Secondary | ICD-10-CM | POA: Insufficient documentation

## 2015-09-19 DIAGNOSIS — I272 Pulmonary hypertension, unspecified: Secondary | ICD-10-CM | POA: Insufficient documentation

## 2015-09-19 LAB — ANTI-SCLERODERMA ANTIBODY: Scleroderma (Scl-70) (ENA) Antibody, IgG: 0.2 AI (ref 0.0–0.9)

## 2015-09-19 NOTE — Progress Notes (Signed)
Patient ID: Tiffany [name], female   DOB: Apr 12, 1946, 70 y.o.   MRN: [id number] PCP: Dr. Sharlet Salina Cardiology: Dr. Percival Spanish HF Cardiology: Dr. [name]  70 yo with history of paroxysmal atrial fibrillation and diastolic CHF was recently noted to have significant pulmonary hypertension.  She has had dyspnea for about a year now.  It has steadily worsened.  She has history of paroxysmal atrial fibrillation and had an episode in 6/16 requiring cardioversion.  She was started on amiodarone but this was stopped with worsening breathing.  Cardiolite in 9/16 showed on ischemia or infarction.    Currently, she is short of breath walking more than about 40-50 yards or walking up a flight of steps.  She is very limited and has curtailed her activity significantly.  She has occasional palpitations.  Episodes do not last long.  She also notes "dizziness" with exertion.  I reviewed her BP log, she has not had any worrisomely low BP.   No chest pain.  Mild orthopnea.  No PND.    I had her do a RHC in 1/17.  This showed elevated left and right heart filling pressures but also PAH. After cath, she increased her Lasix from 40 qod to 40 daily.   ECG: NSR, iRBBB, left axis deviation, inferior and anterolateral TWIs  6 minute walk (2/17): 141 m  Labs (12/16): BNP 1187, ANA 1:640, TSH elevated. Labs (2/17): K 5.4, creatinine 1.78  PMH:  1. CKD stage III 2. Bradycardia 3. Raynauds phenomenon. 4. BPPV 5. OA right hip 6. Hypothyroidism 7. Atrial fibrillation: Paroxysmal.  She was on amiodarone in the past but this was stopped when she became more short of breath.  TEE-guided DCCV in 6/16.  8. HTN 9. 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 - TEE (6/16) with EF 45-50%, mildly dilated RV with mildly decreased systolic function, PASP 36 mmHg.  - RHC (1/17) with mean RA 9, PA  80/29 mean 52, mean PCWP 27, CI 1.97 Fick/1.8 thermo, PVR 7.6 Fick/8.4 thermo.  10. Pulmonary hypertension: See RHC above.  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  - TEE (6/16) with mildly dilated RV with mildly decreased systolic function. - ANA 0000000, h/o Raynauds - PFTs (7/16) with minimal obstructive defect but moderately decreased DLCO.  - 6 minute walk 141 m 2/17. 11. Cardiolite (9/16) with no ischemia/infarction.   SH: Married, lives in [place], no smoking, no ETOH.   FH: Adopted.  Daughter has Raynaud's.  ROS: All systems reviewed and negative except as per HPI  Current Outpatient Prescriptions  Medication Sig Dispense Refill  . acetaminophen (TYLENOL) 325 MG tablet Take 2 tablets (650 mg total) by mouth every 4 (four) hours as needed for headache or mild pain.    . Calcium Carb-Cholecalciferol (CALCIUM 600 + D PO) Take 1 tablet by mouth daily.    Marland Kitchen ELIQUIS 5 MG TABS tablet TAKE 1 TABLET BY MOUTH TWICE A DAY 60 tablet 5  . estradiol (ESTRACE VAGINAL) 0.1 MG/GM vaginal cream Place 1 Applicatorful vaginally once a week. 1 gram once weekly 42.5 g 1  . famotidine (PEPCID AC) 10 MG tablet Take 1 tablet (10 mg total) by mouth daily.    . fluticasone (FLONASE) 50 MCG/ACT nasal spray Place 1 spray into both nostrils daily as needed for allergies or rhinitis. 16 g 2  . furosemide (LASIX) 40 MG tablet  Take 1 tablet (40 mg total) by mouth 2 (two) times daily. 60 tablet 6  . Multiple Vitamins-Minerals (CENTRUM SILVER) tablet Take 1 tablet by mouth daily.     . Omega 3 1000 MG CAPS Take 2,000 mg by mouth daily.    . diazepam (VALIUM) 5 MG tablet Take 1 tablet (5 mg total) by mouth every 8 (eight) hours as needed for anxiety. (Patient not taking: Reported on 09/18/2015) 30 tablet 0  . miconazole (MICOTIN) 2 % powder Apply topically as needed for itching. (Patient not taking: Reported on 09/18/2015) 70 g 0   No current facility-administered medications for this encounter.   BP 132/78 mmHg  Pulse 63  Wt  158 lb (71.668 kg)  SpO2 97% General: NAD Neck: JVP 10-12 cm, no thyromegaly or thyroid nodule.  Lungs: Clear to auscultation bilaterally with normal respiratory effort. CV: Nondisplaced PMI.  Heart regular S1/S2, no S3/S4, 2/6 HSM LLSB.  1+ ankle edema.  No carotid bruit.  Normal pedal pulses.  Abdomen: Soft, nontender, no hepatosplenomegaly, no distention.  Skin: Intact without lesions or rashes.  Neurologic: Alert and oriented x 3.  Psych: Normal affect. Extremities: No clubbing or cyanosis.  HEENT: Normal.   Assessment/Plan: 1. Pulmonary hypertension: Patient has pulmonary arterial hypertension.  She appears to have co-existing diastolic LV dysfunction given elevated PCWP on RHC.  PFTs did not show significant obstruction, they only showed moderately decreased DLCO consistent with pulmonary hypertension. PVR 7.6 WU by Fick and 8.4 WU by thermodiluation.  Cardiac index was low, 1.97 Fick/1.8 thermo.  I am concerned that the "dizziness" she complains of with ambulation may be due to low output.  Mildly dilated and dysfunctional RV on 6/16 TEE. 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 6 minute walk time today was poor.  - ANA 1:640, possible PAH related to rheumatological disease.  She has Raynaud's phenomenon.  She was seen by Dr Charlestine Night, however, and was told that she does not have a definitive rheumatological disease.  I will check anti-centromere antibody and anti-SCL70 Ab today.  - Check V/Q scan to rule out chronic PE. - She should get a sleep study.  - Will need to make sure that she is better diuresed, but needs specific [place] treatment.  I will start her on macitentan 10 mg daily with plans to add Adcirca to her regimen soon.  2. Chronic diastolic CHF: Elevated PCWP on 1/17 RHC.  TEE with EF 45-50% with mildly dilated and dysfunctional RV.  This may be related to atrial fibrillation.  She remains volume overloaded on exam.  - Increase Lasix to 40 mg bid. BMET/BNP today and at followup in 2 wks.   3. Atrial fibrillation: Paroxysmal.  She is in NSR today.  Occasional palpitations.  If she has long breakthrough atrial fibrillation episodes, would consider Tikosyn use (though creatinine is elevated).  Continue Eliquis 5 mg bid.  4. CKD: Creatinine has been around 1.7.  Will check BMET today.   Loralie Champagne 09/19/2015

## 2015-09-23 ENCOUNTER — Ambulatory Visit (HOSPITAL_COMMUNITY): Payer: Medicare Other

## 2015-09-24 ENCOUNTER — Telehealth (HOSPITAL_COMMUNITY): Payer: Self-pay | Admitting: *Deleted

## 2015-09-24 LAB — MISC LABCORP TEST (SEND OUT): Labcorp test code: 164814

## 2015-09-24 MED ORDER — MACITENTAN 10 MG PO TABS: 10.0000 mg | ORAL_TABLET | Freq: Every day | ORAL | Status: DC

## 2015-09-24 NOTE — Telephone Encounter (Signed)
Completed PA for pt's Opsumit, med approved through 03/22/16 through [employer]

## 2015-09-25 ENCOUNTER — Ambulatory Visit (HOSPITAL_COMMUNITY)
Admission: RE | Admit: 2015-09-25 | Discharge: 2015-09-25 | Disposition: A | Discharge status: Home / Self Care | Payer: Medicare Other | Source: Ambulatory Visit | Attending: Cardiology | Admitting: Cardiology

## 2015-09-25 DIAGNOSIS — I272 Other secondary pulmonary hypertension: Secondary | ICD-10-CM | POA: Insufficient documentation

## 2015-09-25 DIAGNOSIS — I517 Cardiomegaly: Secondary | ICD-10-CM | POA: Diagnosis not present

## 2015-09-25 DIAGNOSIS — R918 Other nonspecific abnormal finding of lung field: Secondary | ICD-10-CM | POA: Insufficient documentation

## 2015-09-25 MED ORDER — TECHNETIUM TO 99M ALBUMIN AGGREGATED
4.1000 | Freq: Once | INTRAVENOUS | Status: AC | PRN
  Administered 2015-09-25: 4 via INTRAVENOUS

## 2015-09-25 MED ORDER — TECHNETIUM TC 99M DIETHYLENETRIAME-PENTAACETIC ACID: 30.2000 | Freq: Once | INTRAVENOUS | Status: DC | PRN

## 2015-09-30 ENCOUNTER — Other Ambulatory Visit (INDEPENDENT_AMBULATORY_CARE_PROVIDER_SITE_OTHER): Payer: Medicare Other

## 2015-09-30 ENCOUNTER — Encounter: Payer: Self-pay | Admitting: Internal Medicine

## 2015-09-30 ENCOUNTER — Ambulatory Visit (INDEPENDENT_AMBULATORY_CARE_PROVIDER_SITE_OTHER): Payer: Medicare Other | Admitting: Internal Medicine

## 2015-09-30 ENCOUNTER — Encounter: Payer: Medicare Other | Admitting: Internal Medicine

## 2015-09-30 VITALS — BP 110/78 | HR 80 | Temp 97.6°F | Resp 12 | Ht 60.0 in | Wt 156.0 lb

## 2015-09-30 DIAGNOSIS — Z0000 Encounter for general adult medical examination without abnormal findings: Secondary | ICD-10-CM | POA: Diagnosis not present

## 2015-09-30 DIAGNOSIS — N183 Chronic kidney disease, stage 3 unspecified: Secondary | ICD-10-CM

## 2015-09-30 LAB — COMPREHENSIVE METABOLIC PANEL
ALT: 25 U/L (ref 0–35)
AST: 30 U/L (ref 0–37)
Albumin: 3.9 g/dL (ref 3.5–5.2)
Alkaline Phosphatase: 63 U/L (ref 39–117)
BUN: 24 mg/dL — ABNORMAL HIGH (ref 6–23)
CO2: 27 mEq/L (ref 19–32)
Calcium: 9.6 mg/dL (ref 8.4–10.5)
Chloride: 103 mEq/L (ref 96–112)
Creatinine, Ser: 1.66 mg/dL — ABNORMAL HIGH (ref 0.40–1.20)
GFR: 32.33 mL/min — ABNORMAL LOW (ref >60.00)
Glucose, Bld: 73 mg/dL (ref 70–99)
Potassium: 3.9 mEq/L (ref 3.5–5.1)
Sodium: 139 mEq/L (ref 135–145)
Total Bilirubin: 0.8 mg/dL (ref 0.2–1.2)
Total Protein: 7.3 g/dL (ref 6.0–8.3)

## 2015-09-30 LAB — LIPID PANEL
Cholesterol: 201 mg/dL — ABNORMAL HIGH (ref 0–200)
HDL: 57.3 mg/dL (ref >39.00)
LDL Cholesterol: 112 mg/dL — ABNORMAL HIGH (ref 0–99)
NonHDL: 143.41
Total CHOL/HDL Ratio: 4
Triglycerides: 157 mg/dL — ABNORMAL HIGH (ref 0.0–149.0)
VLDL: 31.4 mg/dL (ref 0.0–40.0)

## 2015-09-30 LAB — BRAIN NATRIURETIC PEPTIDE: Pro B Natriuretic peptide (BNP): 880 pg/mL — ABNORMAL HIGH (ref 0.0–100.0)

## 2015-09-30 MED ORDER — ESTROGENS, CONJUGATED 0.625 MG/GM VA CREA: TOPICAL_CREAM | VAGINAL | Status: DC

## 2015-09-30 NOTE — Progress Notes (Signed)
Pre visit review using our clinic review tool, if applicable. No additional management support is needed unless otherwise documented below in the visit note. 

## 2015-09-30 NOTE — Patient Instructions (Signed)
We have sent in the premarin cream that will replace the estrace.   We have given you the handicapped parking form as well.   We will check the labs today.  Health Maintenance, Female Adopting a healthy lifestyle and getting preventive care can go a long way to promote health and wellness. Talk with your health care provider about what schedule of regular examinations is right for you. This is a good chance for you to check in with your provider about disease prevention and staying healthy. In between checkups, there are plenty of things you can do on your own. Experts have done a lot of research about which lifestyle changes and preventive measures are most likely to keep you healthy. Ask your health care provider for more information. WEIGHT AND DIET  Eat a healthy diet  Be sure to include plenty of vegetables, fruits, low-fat dairy products, and lean protein.  Do not eat a lot of foods high in solid fats, added sugars, or salt.  Get regular exercise. This is one of the most important things you can do for your health.  Most adults should exercise for at least 150 minutes each week. The exercise should increase your heart rate and make you sweat (moderate-intensity exercise).  Most adults should also do strengthening exercises at least twice a week. This is in addition to the moderate-intensity exercise.  Maintain a healthy weight  Body mass index (BMI) is a measurement that can be used to identify possible weight problems. It estimates body fat based on height and weight. Your health care provider can help determine your BMI and help you achieve or maintain a healthy weight.  For females 72 years of age and older:   A BMI below 18.5 is considered underweight.  A BMI of 18.5 to 24.9 is normal.  A BMI of 25 to 29.9 is considered overweight.  A BMI of 30 and above is considered obese.  Watch levels of cholesterol and blood lipids  You should start having your blood tested for  lipids and cholesterol at 72 years of age, then have this test every 5 years.  You may need to have your cholesterol levels checked more often if:  Your lipid or cholesterol levels are high.  You are older than 72 years of age.  You are at high risk for heart disease.  CANCER SCREENING   Lung Cancer  Lung cancer screening is recommended for adults 30-53 years old who are at high risk for lung cancer because of a history of smoking.  A yearly low-dose CT scan of the lungs is recommended for people who:  Currently smoke.  Have quit within the past 15 years.  Have at least a 30-pack-year history of smoking. A pack year is smoking an average of one pack of cigarettes a day for 1 year.  Yearly screening should continue until it has been 15 years since you quit.  Yearly screening should stop if you develop a health problem that would prevent you from having lung cancer treatment.  Breast Cancer  Practice breast self-awareness. This means understanding how your breasts normally appear and feel.  It also means doing regular breast self-exams. Let your health care provider know about any changes, no matter how small.  If you are in your 72s or 30s, you should have a clinical breast exam (CBE) by a health care provider every 1-3 years as part of a regular health exam.  If you are 72 or older, have a CBE  every year. Also consider having a breast X-ray (mammogram) every year.  If you have a family history of breast cancer, talk to your health care provider about genetic screening.  If you are at high risk for breast cancer, talk to your health care provider about having an MRI and a mammogram every year.  Breast cancer gene (BRCA) assessment is recommended for women who have family members with BRCA-related cancers. BRCA-related cancers include:  Breast.  Ovarian.  Tubal.  Peritoneal cancers.  Results of the assessment will determine the need for genetic counseling and BRCA1  and BRCA2 testing. Cervical Cancer Your health care provider may recommend that you be screened regularly for cancer of the pelvic organs (ovaries, uterus, and vagina). This screening involves a pelvic examination, including checking for microscopic changes to the surface of your cervix (Pap test). You may be encouraged to have this screening done every 3 years, beginning at age 72.  For women ages 72-65, health care providers may recommend pelvic exams and Pap testing every 3 years, or they may recommend the Pap and pelvic exam, combined with testing for human papilloma virus (HPV), every 5 years. Some types of HPV increase your risk of cervical cancer. Testing for HPV may also be done on women of any age with unclear Pap test results.  Other health care providers may not recommend any screening for nonpregnant women who are considered low risk for pelvic cancer and who do not have symptoms. Ask your health care provider if a screening pelvic exam is right for you.  If you have had past treatment for cervical cancer or a condition that could lead to cancer, you need Pap tests and screening for cancer for at least 20 years after your treatment. If Pap tests have been discontinued, your risk factors (such as having a new sexual partner) need to be reassessed to determine if screening should resume. Some women have medical problems that increase the chance of getting cervical cancer. In these cases, your health care provider may recommend more frequent screening and Pap tests. Colorectal Cancer  This type of cancer can be detected and often prevented.  Routine colorectal cancer screening usually begins at 72 years of age and continues through 72 years of age.  Your health care provider may recommend screening at an earlier age if you have risk factors for colon cancer.  Your health care provider may also recommend using home test kits to check for hidden blood in the stool.  A small camera at the  end of a tube can be used to examine your colon directly (sigmoidoscopy or colonoscopy). This is done to check for the earliest forms of colorectal cancer.  Routine screening usually begins at age 72.  Direct examination of the colon should be repeated every 5-10 years through 72 years of age. However, you may need to be screened more often if early forms of precancerous polyps or small growths are found. Skin Cancer  Check your skin from head to toe regularly.  Tell your health care provider about any new moles or changes in moles, especially if there is a change in a mole's shape or color.  Also tell your health care provider if you have a mole that is larger than the size of a pencil eraser.  Always use sunscreen. Apply sunscreen liberally and repeatedly throughout the day.  Protect yourself by wearing long sleeves, pants, a wide-brimmed hat, and sunglasses whenever you are outside. HEART DISEASE, DIABETES, AND HIGH BLOOD PRESSURE  High blood pressure causes heart disease and increases the risk of stroke. High blood pressure is more likely to develop in:  People who have blood pressure in the high end of the normal range (130-139/85-89 mm Hg).  People who are overweight or obese.  People who are African American.  If you are 18-39 years of age, have your blood pressure checked every 3-5 years. If you are 40 years of age or older, have your blood pressure checked every year. You should have your blood pressure measured twice--once when you are at a hospital or clinic, and once when you are not at a hospital or clinic. Record the average of the two measurements. To check your blood pressure when you are not at a hospital or clinic, you can use:  An automated blood pressure machine at a pharmacy.  A home blood pressure monitor.  If you are between 55 years and 79 years old, ask your health care provider if you should take aspirin to prevent strokes.  Have regular diabetes  screenings. This involves taking a blood sample to check your fasting blood sugar level.  If you are at a normal weight and have a low risk for diabetes, have this test once every three years after 72 years of age.  If you are overweight and have a high risk for diabetes, consider being tested at a younger age or more often. PREVENTING INFECTION  Hepatitis B  If you have a higher risk for hepatitis B, you should be screened for this virus. You are considered at high risk for hepatitis B if:  You were born in a country where hepatitis B is common. Ask your health care provider which countries are considered high risk.  Your parents were born in a high-risk country, and you have not been immunized against hepatitis B (hepatitis B vaccine).  You have HIV or AIDS.  You use needles to inject street drugs.  You live with someone who has hepatitis B.  You have had sex with someone who has hepatitis B.  You get hemodialysis treatment.  You take certain medicines for conditions, including cancer, organ transplantation, and autoimmune conditions. Hepatitis C  Blood testing is recommended for:  Everyone born from 1945 through 1965.  Anyone with known risk factors for hepatitis C. Sexually transmitted infections (STIs)  You should be screened for sexually transmitted infections (STIs) including gonorrhea and chlamydia if:  You are sexually active and are younger than 72 years of age.  You are older than 72 years of age and your health care provider tells you that you are at risk for this type of infection.  Your sexual activity has changed since you were last screened and you are at an increased risk for chlamydia or gonorrhea. Ask your health care provider if you are at risk.  If you do not have HIV, but are at risk, it may be recommended that you take a prescription medicine daily to prevent HIV infection. This is called pre-exposure prophylaxis (PrEP). You are considered at risk  if:  You are sexually active and do not regularly use condoms or know the HIV status of your partner(s).  You take drugs by injection.  You are sexually active with a partner who has HIV. Talk with your health care provider about whether you are at high risk of being infected with HIV. If you choose to begin PrEP, you should first be tested for HIV. You should then be tested every 3 months for as   long as you are taking PrEP.  PREGNANCY   If you are premenopausal and you may become pregnant, ask your health care provider about preconception counseling.  If you may become pregnant, take 400 to 800 micrograms (mcg) of folic acid every day.  If you want to prevent pregnancy, talk to your health care provider about birth control (contraception). OSTEOPOROSIS AND MENOPAUSE   Osteoporosis is a disease in which the bones lose minerals and strength with aging. This can result in serious bone fractures. Your risk for osteoporosis can be identified using a bone density scan.  If you are 30 years of age or older, or if you are at risk for osteoporosis and fractures, ask your health care provider if you should be screened.  Ask your health care provider whether you should take a calcium or vitamin D supplement to lower your risk for osteoporosis.  Menopause may have certain physical symptoms and risks.  Hormone replacement therapy may reduce some of these symptoms and risks. Talk to your health care provider about whether hormone replacement therapy is right for you.  HOME CARE INSTRUCTIONS   Schedule regular health, dental, and eye exams.  Stay current with your immunizations.   Do not use any tobacco products including cigarettes, chewing tobacco, or electronic cigarettes.  If you are pregnant, do not drink alcohol.  If you are breastfeeding, limit how much and how often you drink alcohol.  Limit alcohol intake to no more than 1 drink per day for nonpregnant women. One drink equals 12  ounces of beer, 5 ounces of wine, or 1 ounces of hard liquor.  Do not use street drugs.  Do not share needles.  Ask your health care provider for help if you need support or information about quitting drugs.  Tell your health care provider if you often feel depressed.  Tell your health care provider if you have ever been abused or do not feel safe at home.   This information is not intended to replace advice given to you by your health care provider. Make sure you discuss any questions you have with your health care provider.   Document Released: 02/02/2011 Document Revised: 08/10/2014 Document Reviewed: 06/21/2013 Elsevier Interactive Patient Education [employer].

## 2015-10-03 ENCOUNTER — Encounter: Payer: Self-pay | Admitting: Internal Medicine

## 2015-10-03 DIAGNOSIS — Z0000 Encounter for general adult medical examination without abnormal findings: Secondary | ICD-10-CM | POA: Insufficient documentation

## 2015-10-03 NOTE — Assessment & Plan Note (Signed)
Due for additional pneumonia 23 shot which we will not do today and hep c screening which we will not do today. Other health maintenance up to date including colonoscopy, mammogram. Counseled on increasing exercise as her pulmonary hypertension improves with treatment. Given 10 year screening recommendations.

## 2015-10-03 NOTE — Assessment & Plan Note (Signed)
Kidney function mildly improved since starting treatment for her pulmonary hypertension. Will need close monitoring. BP at goal and no diabetes.

## 2015-10-03 NOTE — Progress Notes (Signed)
   Subjective:    Patient ID: Tiffany [name], female    DOB: [birth year], 70 y.o.   MRN: [id number]  HPI Here for medicare wellness, no new complaints. Please see A/P for status and treatment of chronic medical problems. Doing better with SOB after starting opsumit.  Diet: heart healthy Physical activity: sedentary Depression/mood screen: negative Hearing: intact to whispered voice with aids Visual acuity: grossly normal with lens, performs annual eye exam  ADLs: capable Fall risk: none Home safety: good Cognitive evaluation: intact to orientation, naming, recall and repetition EOL planning: adv directives discussed  I have personally reviewed and have noted 1. The patient's medical and social history - reviewed today no changes 2. Their use of alcohol, tobacco or illicit drugs 3. Their current medications and supplements 4. The patient's functional ability including ADL's, fall risks, home safety risks and hearing or visual impairment. 5. Diet and physical activities 6. Evidence for depression or mood disorders 7. Care team reviewed and updated (available in snapshot)  Review of Systems  Constitutional: Positive for activity change and fatigue. Negative for chills, appetite change and unexpected weight change.  HENT: Negative for congestion, ear pain, postnasal drip, rhinorrhea, sinus pressure and sore throat.   Eyes: Negative.   Respiratory: Positive for shortness of breath. Negative for cough, chest tightness and wheezing.   Cardiovascular: Negative for chest pain, palpitations and leg swelling.  Gastrointestinal: Negative for nausea, abdominal pain, diarrhea, constipation and abdominal distention.  Endocrine: Negative for cold intolerance and heat intolerance.  Musculoskeletal: Negative.   Skin: Negative.   Neurological: Negative for dizziness, seizures, syncope, speech difficulty, weakness, light-headedness and headaches.  Psychiatric/Behavioral: Negative.       Objective:    Physical Exam  Constitutional: She is oriented to person, place, and time. She appears well-developed and well-nourished.  HENT:  Head: Normocephalic and atraumatic.  Eyes: EOM are normal.  Neck: Normal range of motion.  Cardiovascular: Normal rate and regular rhythm.   Appears regular  Pulmonary/Chest: Effort normal and breath sounds normal. No respiratory distress. She has no wheezes.  Abdominal: Soft. She exhibits no distension. There is no tenderness. There is no rebound.  Musculoskeletal: She exhibits no edema.  Neurological: She is alert and oriented to person, place, and time. Coordination normal.  Skin: Skin is warm and dry.  Psychiatric: She has a normal mood and affect.   Filed Vitals:   09/30/15 1006  BP: 110/78  Pulse: 80  Temp: 97.6 F (36.4 C)  TempSrc: Oral  Resp: 12  Height: 5' (1.524 m)  Weight: 156 lb (70.761 kg)      Assessment & Plan:

## 2015-10-11 LAB — HM MAMMOGRAPHY

## 2015-10-14 ENCOUNTER — Encounter (HOSPITAL_COMMUNITY): Payer: Self-pay

## 2015-10-14 ENCOUNTER — Ambulatory Visit (HOSPITAL_COMMUNITY)
Admission: RE | Admit: 2015-10-14 | Discharge: 2015-10-14 | Disposition: A | Discharge status: Home / Self Care | Payer: Medicare Other | Source: Ambulatory Visit | Attending: Cardiology | Admitting: Cardiology

## 2015-10-14 VITALS — BP 128/80 | HR 91 | Wt 158.2 lb

## 2015-10-14 DIAGNOSIS — M349 Systemic sclerosis, unspecified: Secondary | ICD-10-CM

## 2015-10-14 DIAGNOSIS — M1611 Unilateral primary osteoarthritis, right hip: Secondary | ICD-10-CM | POA: Diagnosis not present

## 2015-10-14 DIAGNOSIS — N183 Chronic kidney disease, stage 3 unspecified: Secondary | ICD-10-CM

## 2015-10-14 DIAGNOSIS — E039 Hypothyroidism, unspecified: Secondary | ICD-10-CM | POA: Insufficient documentation

## 2015-10-14 DIAGNOSIS — Z79899 Other long term (current) drug therapy: Secondary | ICD-10-CM | POA: Insufficient documentation

## 2015-10-14 DIAGNOSIS — I272 Other secondary pulmonary hypertension: Secondary | ICD-10-CM | POA: Diagnosis not present

## 2015-10-14 DIAGNOSIS — I7300 Raynaud's syndrome without gangrene: Secondary | ICD-10-CM | POA: Diagnosis not present

## 2015-10-14 DIAGNOSIS — I480 Paroxysmal atrial fibrillation: Secondary | ICD-10-CM | POA: Insufficient documentation

## 2015-10-14 DIAGNOSIS — I5032 Chronic diastolic (congestive) heart failure: Secondary | ICD-10-CM

## 2015-10-14 DIAGNOSIS — I130 Hypertensive heart and chronic kidney disease with heart failure and stage 1 through stage 4 chronic kidney disease, or unspecified chronic kidney disease: Secondary | ICD-10-CM | POA: Diagnosis not present

## 2015-10-14 DIAGNOSIS — Z7901 Long term (current) use of anticoagulants: Secondary | ICD-10-CM | POA: Diagnosis not present

## 2015-10-14 MED ORDER — TADALAFIL (PAH) 20 MG PO TABS: 20.0000 mg | ORAL_TABLET | Freq: Every day | ORAL | Status: DC

## 2015-10-14 NOTE — Patient Instructions (Signed)
START Adcirca 20mg  tablet once daily.  Will refer you to Dr. Hurley Cisco for rheumatology work up for scleroderma. Address: [address], [place], [place] [address]  Phone: [phone]  Will schedule you for a sleep study.  Follow up 1 month with Dr. [name].  Do the following things EVERYDAY: 1) Weigh yourself in the morning before breakfast. Write it down and keep it in a log. 2) Take your medicines as prescribed 3) Eat low salt foods-Limit salt (sodium) to 2000 mg per day.  4) Stay as active as you can everyday 5) Limit all fluids for the day to less than 2 liters

## 2015-10-14 NOTE — Progress Notes (Signed)
Patient ID: Tiffany [name], female   DOB: 1945/08/10, 70 y.o.   MRN: [id number] PCP: Dr. Sharlet Salina Cardiology: Dr. Percival Spanish HF Cardiology: Dr. [name]  70 yo with history of paroxysmal atrial fibrillation and diastolic CHF was recently noted to have significant pulmonary hypertension.  She has had dyspnea for about a year now.  It has steadily worsened.  She has history of paroxysmal atrial fibrillation and had an episode in 6/16 requiring cardioversion.  She was started on amiodarone but this was stopped with worsening breathing.  Cardiolite in 9/16 showed on ischemia or infarction.     I had her do a RHC in 1/17.  This showed elevated left and right heart filling pressures but also PAH. After cath, she increased her Lasix from 40 qod to 40 daily.  At last appointment, I increased Lasix to 40 mg bid and also started her on Opsumit.   She is breathing better today but still limited.  She is able to climb a flight of steps with some dyspnea at the top.  Prior to Opsumit, she could not have climbed steps.  She is still short of breath after walking about 100 yards or if she tries to walk fast.  No orthopnea/PND.  No chest pain.    6 minute walk (2/17): 141 m 6 minute walk (3/17): 182 m  Labs (12/16): BNP 1187, ANA 1:640, TSH elevated. Labs (2/17): K 5.4 => 3.9, creatinine 1.78 => 1.66, LDL 112, BNP 880  PMH:  1. CKD stage III 2. Bradycardia 3. Raynauds phenomenon. 4. BPPV 5. OA right hip 6. Hypothyroidism 7. Atrial fibrillation: Paroxysmal.  She was on amiodarone in the past but this was stopped when she became more short of breath.  TEE-guided DCCV in 6/16.  8. HTN 9. 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 - TEE (6/16) with EF 45-50%, mildly dilated RV with mildly decreased systolic function, PASP 36 mmHg.  - RHC (1/17) with mean RA 9, PA 80/29 mean  52, mean PCWP 27, CI 1.97 Fick/1.8 thermo, PVR 7.6 Fick/8.4 thermo.  10. Pulmonary hypertension: See RHC above.  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  - TEE (6/16) with mildly dilated RV with mildly decreased systolic function. - ANA 0000000, anti-centromere antibody elevated, anti-SCL-70 antibody negative, h/o Raynauds - PFTs (7/16) with minimal obstructive defect but moderately decreased DLCO.  - V/Q scan (2/17) negative for acute or chronic PE.  11. Cardiolite (9/16) with no ischemia/infarction.   SH: Married, lives in [place], no smoking, no ETOH.   FH: Adopted.  Daughter has Raynaud's.  ROS: All systems reviewed and negative except as per HPI  Current Outpatient Prescriptions  Medication Sig Dispense Refill  . Calcium Carb-Cholecalciferol (CALCIUM 600 + D PO) Take 1 tablet by mouth daily.    Marland Kitchen ELIQUIS 5 MG TABS tablet TAKE 1 TABLET BY MOUTH TWICE A DAY 60 tablet 5  . estradiol (ESTRACE) 0.1 MG/GM vaginal cream Place 1 g vaginally once a week.     . famotidine (PEPCID AC) 10 MG tablet Take 1 tablet (10 mg total) by mouth daily.    . furosemide (LASIX) 40 MG tablet Take 1 tablet (40 mg total) by mouth 2 (two) times daily. 60 tablet 6  . Macitentan (OPSUMIT) 10 MG TABS Take 10 mg by mouth daily. 30 tablet   . Multiple Vitamins-Minerals (CENTRUM SILVER) tablet Take  1 tablet by mouth daily.     . Omega 3 1000 MG CAPS Take 2,000 mg by mouth daily.    Marland Kitchen acetaminophen (TYLENOL) 325 MG tablet Take 2 tablets (650 mg total) by mouth every 4 (four) hours as needed for headache or mild pain. (Patient not taking: Reported on 10/14/2015)    . diazepam (VALIUM) 5 MG tablet Take 1 tablet (5 mg total) by mouth every 8 (eight) hours as needed for anxiety. (Patient not taking: Reported on 09/18/2015) 30 tablet 0  . fluticasone (FLONASE) 50 MCG/ACT nasal spray Place 1 spray into both nostrils daily as needed for allergies or rhinitis. (Patient not taking: Reported on 10/14/2015) 16 g 2  . Tadalafil, PAH, (ADCIRCA) 20  MG TABS Take 1 tablet (20 mg total) by mouth daily. 30 tablet 6   No current facility-administered medications for this encounter.   BP 128/80 mmHg  Pulse 91  Wt 158 lb 4 oz (71.782 kg)  SpO2 97% General: NAD Neck: JVP 8 cm, no thyromegaly or thyroid nodule.  Lungs: Clear to auscultation bilaterally with normal respiratory effort. CV: Nondisplaced PMI.  Heart regular S1/S2, no S3/S4, 2/6 HSM LLSB.  Trace ankle edema.  No carotid bruit.  Normal pedal pulses.  Abdomen: Soft, nontender, no hepatosplenomegaly, no distention.  Skin: Intact without lesions or rashes.  Neurologic: Alert and oriented x 3.  Psych: Normal affect. Extremities: No clubbing or cyanosis.  HEENT: Normal.   Assessment/Plan: 1. Pulmonary hypertension: Patient has pulmonary arterial hypertension.  She appears to have co-existing diastolic LV dysfunction given elevated PCWP on RHC.  PFTs did not show significant obstruction, they only showed moderately decreased DLCO consistent with pulmonary hypertension. V/Q scan did not show evidence for chronic PE.  PVR 7.6 WU by Fick and 8.4 WU by thermodiluation.  Cardiac index was low, 1.97 Fick/1.8 thermo.  Mildly dilated and dysfunctional RV on 6/16 TEE. 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  6 minute walk improved by 41 meters after starting Opsumit.  - ANA 1:640 with elevated anti-centromere antibody and Raynaud's phenomenon, possible PAH related to rheumatological disease, ?CREST syndrome variant.  She was seen by Dr Charlestine Night in the past.  In light of the elevated anti-centromere Ab, I would like her to go back for re-evaluation. - I will arrange a sleep study.   - Continue Opsumit, will now add Adcirca 20 mg daily to regimen.  2. Chronic diastolic CHF: Elevated PCWP on 1/17 RHC.  TEE with EF 45-50% with mildly dilated and dysfunctional RV.  Volume status looks better on exam today.   - Continue Lasix 40 mg bid. BMET stable recently.  3. Atrial fibrillation: Paroxysmal.  She is in NSR  today.  Occasional palpitations.  If she has long breakthrough atrial fibrillation episodes, would consider Tikosyn use (though creatinine is elevated).  Continue Eliquis 5 mg bid.  4. CKD: Stage III.    Loralie Champagne 10/14/2015

## 2015-10-14 NOTE — Progress Notes (Signed)
6 Minute Walk Start 97% 97 HR  Patient ambulated with steady but slow gait and pace, became short of breath fairly quickly, but kept sats above 90% and HR around 120s.  Had to take 2 rest breaks lasting about 30-45 seconds each.  Total ambulation 600 ft (182.88 meters).  End 92% 118 HR

## 2015-10-23 ENCOUNTER — Telehealth (HOSPITAL_COMMUNITY): Payer: Self-pay | Admitting: *Deleted

## 2015-10-23 NOTE — Telephone Encounter (Signed)
Completed PA for pt's Adcirca through [employer], med approved through 04/18/16, [facility] aware

## 2015-10-31 ENCOUNTER — Other Ambulatory Visit (HOSPITAL_COMMUNITY): Payer: Self-pay

## 2015-10-31 MED ORDER — FUROSEMIDE 40 MG PO TABS
40.0000 mg | ORAL_TABLET | Freq: Two times a day (BID) | ORAL | Status: DC
Start: 2015-10-31 — End: 2015-11-19

## 2015-11-07 ENCOUNTER — Encounter: Payer: Self-pay | Admitting: Geriatric Medicine

## 2015-11-19 ENCOUNTER — Ambulatory Visit (HOSPITAL_COMMUNITY)
Admission: RE | Admit: 2015-11-19 | Discharge: 2015-11-19 | Disposition: A | Discharge status: Home / Self Care | Payer: Medicare Other | Source: Ambulatory Visit | Attending: Cardiology | Admitting: Cardiology

## 2015-11-19 VITALS — BP 132/84 | HR 82 | Wt 160.5 lb

## 2015-11-19 DIAGNOSIS — M1611 Unilateral primary osteoarthritis, right hip: Secondary | ICD-10-CM | POA: Insufficient documentation

## 2015-11-19 DIAGNOSIS — I272 Other secondary pulmonary hypertension: Secondary | ICD-10-CM | POA: Diagnosis not present

## 2015-11-19 DIAGNOSIS — Z79899 Other long term (current) drug therapy: Secondary | ICD-10-CM | POA: Diagnosis not present

## 2015-11-19 DIAGNOSIS — I7300 Raynaud's syndrome without gangrene: Secondary | ICD-10-CM | POA: Insufficient documentation

## 2015-11-19 DIAGNOSIS — I5032 Chronic diastolic (congestive) heart failure: Secondary | ICD-10-CM | POA: Diagnosis not present

## 2015-11-19 DIAGNOSIS — N183 Chronic kidney disease, stage 3 unspecified: Secondary | ICD-10-CM

## 2015-11-19 DIAGNOSIS — I130 Hypertensive heart and chronic kidney disease with heart failure and stage 1 through stage 4 chronic kidney disease, or unspecified chronic kidney disease: Secondary | ICD-10-CM | POA: Diagnosis not present

## 2015-11-19 DIAGNOSIS — I480 Paroxysmal atrial fibrillation: Secondary | ICD-10-CM | POA: Insufficient documentation

## 2015-11-19 DIAGNOSIS — Z7901 Long term (current) use of anticoagulants: Secondary | ICD-10-CM | POA: Diagnosis not present

## 2015-11-19 DIAGNOSIS — E039 Hypothyroidism, unspecified: Secondary | ICD-10-CM | POA: Diagnosis not present

## 2015-11-19 LAB — BRAIN NATRIURETIC PEPTIDE: B Natriuretic Peptide: 674.6 pg/mL — ABNORMAL HIGH (ref 0.0–100.0)

## 2015-11-19 LAB — BASIC METABOLIC PANEL
Anion gap: 12 (ref 5–15)
BUN: 20 mg/dL (ref 6–20)
CO2: 22 mmol/L (ref 22–32)
Calcium: 9.5 mg/dL (ref 8.9–10.3)
Chloride: 108 mmol/L (ref 101–111)
Creatinine, Ser: 1.57 mg/dL — ABNORMAL HIGH (ref 0.44–1.00)
GFR calc Af Amer: 37 mL/min — ABNORMAL LOW (ref >60)
GFR calc non Af Amer: 32 mL/min — ABNORMAL LOW (ref >60)
Glucose, Bld: 97 mg/dL (ref 65–99)
Potassium: 4.5 mmol/L (ref 3.5–5.1)
Sodium: 142 mmol/L (ref 135–145)

## 2015-11-19 MED ORDER — FUROSEMIDE 40 MG PO TABS: ORAL_TABLET | ORAL | Status: DC

## 2015-11-19 MED ORDER — POTASSIUM CHLORIDE CRYS ER 20 MEQ PO TBCR: 20.0000 meq | EXTENDED_RELEASE_TABLET | Freq: Every day | ORAL | Status: DC

## 2015-11-19 NOTE — Patient Instructions (Signed)
Increase Furosemide (Lasix) to 80 mg (2 tabs) in AM and 40 mg (1 tab) in PM  Start Potassium 20 meq daily  Labs today  Labs in 1 week  Your physician recommends that you schedule a follow-up appointment in: 1 month

## 2015-11-19 NOTE — Progress Notes (Signed)
Advanced Heart Failure Medication Review by a Pharmacist  Does the patient  feel that his/her medications are working for him/her?  yes  Has the patient been experiencing any side effects to the medications prescribed?  no  Does the patient measure his/her own blood pressure or blood glucose at home?  yes   Does the patient have any problems obtaining medications due to transportation or finances?   no  Understanding of regimen: good Understanding of indications: good Potential of compliance: good Patient understands to avoid NSAIDs. Patient understands to avoid decongestants.  Issues to address at subsequent visits: None   Pharmacist comments:  Mrs. Palella is a pleasant 70 yo F presenting with her husband and a current medication list. She reports good compliance with her regimen. She did state that she has had significant lower extremity edema since starting Adcirca and her lasix does not seem to be working as well to keep the fluid off. She did not have any other medication-related questions or concerns for me at this time.   Ruta Hinds. Velva Harman, PharmD, BCPS, CPP Clinical Pharmacist Pager: [phone] Phone: [phone] 11/19/2015 12:37 PM      Time with patient: 10 minutes Preparation and documentation time:  2 minutes Total time: 12 minutes

## 2015-11-19 NOTE — Progress Notes (Signed)
Patient ID: Tiffany [name], female   DOB: [birth year], 70 y.o.   MRN: [id number] PCP: Dr. Sharlet Salina Cardiology: Dr. Percival Spanish HF Cardiology: Dr. [name]  70 yo with history of paroxysmal atrial fibrillation and diastolic CHF was recently noted to have significant pulmonary hypertension.  She has had dyspnea for about a year now.  It has steadily worsened.  She has history of paroxysmal atrial fibrillation and had an episode in 6/16 requiring cardioversion.  She was started on amiodarone but this was stopped with worsening breathing.  Cardiolite in 9/16 showed on ischemia or infarction.     I had her do a RHC in 1/17.  This showed elevated left and right heart filling pressures but also PAH. After cath, she increased her Lasix from 40 qod to 40 daily.  At a prior appointment, I increased Lasix to 40 mg bid and also started her on Opsumit.  At last appointment, I added Adcirca 20 and titrated it up to 40 mg daily.   She is doing a bit worse today. Increased lower extremity edema since starting Adcirca.  Weight is up about 2 lbs on our scales.  She is more short of breath, can walk about 100 feet before developing dyspnea.  Dyspnea with stairs but she can get up them.  No orthopnea/PND.  No palpitations.  She is in NSR today.    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   6 minute walk (2/17): 141 m 6 minute walk (3/17): 182 m  Labs (12/16): BNP 1187, ANA 1:640, TSH elevated. Labs (2/17): K 5.4 => 3.9, creatinine 1.78 => 1.66, LDL 112, BNP 880  PMH:  1. CKD stage III 2. Bradycardia 3. Raynauds phenomenon. 4. BPPV 5. OA right hip 6. Hypothyroidism 7. Atrial fibrillation: Paroxysmal.  She was on amiodarone in the past but this was stopped when she became more short of breath.  TEE-guided DCCV in 6/16.  8. HTN 9. 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 - TEE (6/16) with EF 45-50%, mildly dilated RV with mildly decreased systolic function, PASP 36 mmHg.  - RHC (1/17) with mean RA 9, PA 80/29 mean 52, mean PCWP 27, CI 1.97 Fick/1.8 thermo, PVR 7.6 Fick/8.4 thermo.  10. Pulmonary hypertension: See RHC above.  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  - TEE (6/16) with mildly dilated RV with mildly decreased systolic function. - ANA 0000000, anti-centromere antibody elevated, anti-SCL-70 antibody negative, h/o Raynauds - PFTs (7/16) with minimal obstructive defect but moderately decreased DLCO.  - V/Q scan (2/17) negative for acute or chronic PE.  11. Cardiolite (9/16) with no ischemia/infarction.   SH: Married, lives in [place], no smoking, no ETOH.   FH: Adopted.  Daughter has Raynaud's.  ROS: All systems reviewed and negative except as per HPI  Current Outpatient Prescriptions  Medication Sig Dispense Refill  . Calcium Carb-Cholecalciferol (CALCIUM 600 + D PO) Take 1 tablet by mouth daily.    Marland Kitchen ELIQUIS 5 MG TABS tablet TAKE 1 TABLET BY MOUTH TWICE A DAY 60 tablet 5  . estradiol (ESTRACE) 0.1 MG/GM vaginal cream Place 1 g vaginally once a week.     . famotidine (PEPCID AC) 10 MG tablet Take 1 tablet (10 mg total) by mouth daily.    . furosemide (LASIX) 40 MG tablet Take 2 tabs in AM and  1 tab in PM 90 tablet 6  . Macitentan (OPSUMIT) 10 MG TABS Take 10 mg by mouth daily. 30 tablet   . Multiple Vitamins-Minerals (CENTRUM SILVER) tablet Take 1 tablet by mouth daily.     . Omega 3 1000 MG CAPS Take 2,000 mg by mouth daily.    . Tadalafil, PAH, (ADCIRCA) 20 MG TABS Take 40 mg by mouth daily.    Marland Kitchen acetaminophen (TYLENOL) 325 MG tablet Take 2 tablets (650 mg total) by mouth every 4 (four) hours as needed for headache or mild pain. (Patient not taking: Reported on 10/14/2015)    . diazepam (VALIUM) 5 MG tablet Take 1 tablet (5 mg total) by mouth every 8 (eight) hours as needed for anxiety. (Patient not taking: Reported on 09/18/2015)  30 tablet 0  . fluticasone (FLONASE) 50 MCG/ACT nasal spray Place 1 spray into both nostrils daily as needed for allergies or rhinitis. (Patient not taking: Reported on 10/14/2015) 16 g 2  . potassium chloride SA (K-DUR,KLOR-CON) 20 MEQ tablet Take 1 tablet (20 mEq total) by mouth daily. 30 tablet 3   No current facility-administered medications for this encounter.   BP 132/84 mmHg  Pulse 82  Wt 160 lb 8 oz (72.802 kg)  SpO2 95% General: NAD Neck: JVP 10-12 cm, no thyromegaly or thyroid nodule.  Lungs: Clear to auscultation bilaterally with normal respiratory effort. CV: Nondisplaced PMI.  Heart regular S1/S2, no S3/S4, 2/6 HSM LLSB.  1+ edema to knees bilaterally.  No carotid bruit.  Normal pedal pulses.  Abdomen: Soft, nontender, no hepatosplenomegaly, no distention.  Skin: Intact without lesions or rashes.  Neurologic: Alert and oriented x 3.  Psych: Normal affect. Extremities: No clubbing or cyanosis.  HEENT: Normal.   Assessment/Plan: 1. Pulmonary hypertension: Patient has pulmonary arterial hypertension.  She appears to have co-existing diastolic LV dysfunction given elevated PCWP on RHC.  PFTs did not show significant obstruction, they only showed moderately decreased DLCO consistent with pulmonary hypertension. V/Q scan did not show evidence for chronic PE.  PVR 7.6 WU by Fick and 8.4 WU by thermodiluation.  Cardiac index was low, 1.97 Fick/1.8 thermo.  Mildly dilated and dysfunctional RV on 6/16 TEE. 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   - Still waiting on sleep study.   - Continue Opsumit and Adcirca 40 daily.  Increase volume overload since starting Adcirca.  Will need diuresis as below.  Would hold off on 6 minute walk until next appointment.  2. Chronic diastolic CHF: Elevated PCWP on 1/17 RHC.  TEE with  EF 45-50% with mildly dilated and dysfunctional RV.  She is more volume overloaded on exam today since starting Adcirca.  NYHA class III symptoms.    - Increase Lasix to 80 qam/40 qpm.  Start KCl 20 daily.  BMET/BNP today and repeat in 1 week.  3. Atrial fibrillation: Paroxysmal.  She is in NSR today.  No significant palpitations.  If she has long breakthrough atrial fibrillation episodes, would consider amiodarone use (hold off on Tikosyn with elevated creatinine).  Continue Eliquis 5 mg bid.  4. CKD: Stage III.   Return in 1 month with 6 minute walk.    Loralie Champagne 11/19/2015

## 2015-11-25 ENCOUNTER — Ambulatory Visit (HOSPITAL_COMMUNITY)
Admission: RE | Admit: 2015-11-25 | Discharge: 2015-11-25 | Disposition: A | Discharge status: Home / Self Care | Payer: Medicare Other | Source: Ambulatory Visit | Attending: Cardiology | Admitting: Cardiology

## 2015-11-25 DIAGNOSIS — Z95811 Presence of heart assist device: Secondary | ICD-10-CM | POA: Diagnosis not present

## 2015-11-25 DIAGNOSIS — I5023 Acute on chronic systolic (congestive) heart failure: Secondary | ICD-10-CM

## 2015-11-25 DIAGNOSIS — I5032 Chronic diastolic (congestive) heart failure: Secondary | ICD-10-CM | POA: Diagnosis not present

## 2015-11-25 LAB — BASIC METABOLIC PANEL
Anion gap: 7 (ref 5–15)
BUN: 30 mg/dL — ABNORMAL HIGH (ref 6–20)
CO2: 28 mmol/L (ref 22–32)
Calcium: 9.6 mg/dL (ref 8.9–10.3)
Chloride: 105 mmol/L (ref 101–111)
Creatinine, Ser: 1.76 mg/dL — ABNORMAL HIGH (ref 0.44–1.00)
GFR calc Af Amer: 32 mL/min — ABNORMAL LOW (ref >60)
GFR calc non Af Amer: 28 mL/min — ABNORMAL LOW (ref >60)
Glucose, Bld: 89 mg/dL (ref 65–99)
Potassium: 4.5 mmol/L (ref 3.5–5.1)
Sodium: 140 mmol/L (ref 135–145)

## 2015-11-27 ENCOUNTER — Encounter (HOSPITAL_COMMUNITY): Payer: Self-pay

## 2015-12-23 ENCOUNTER — Encounter (HOSPITAL_COMMUNITY): Payer: Self-pay

## 2015-12-23 ENCOUNTER — Ambulatory Visit (HOSPITAL_COMMUNITY)
Admission: RE | Admit: 2015-12-23 | Discharge: 2015-12-23 | Disposition: A | Discharge status: Home / Self Care | Payer: Medicare Other | Source: Ambulatory Visit | Attending: Cardiology | Admitting: Cardiology

## 2015-12-23 VITALS — BP 128/80 | HR 84 | Wt 156.0 lb

## 2015-12-23 DIAGNOSIS — I130 Hypertensive heart and chronic kidney disease with heart failure and stage 1 through stage 4 chronic kidney disease, or unspecified chronic kidney disease: Secondary | ICD-10-CM | POA: Diagnosis not present

## 2015-12-23 DIAGNOSIS — Z79899 Other long term (current) drug therapy: Secondary | ICD-10-CM | POA: Diagnosis not present

## 2015-12-23 DIAGNOSIS — I7300 Raynaud's syndrome without gangrene: Secondary | ICD-10-CM | POA: Diagnosis not present

## 2015-12-23 DIAGNOSIS — M1611 Unilateral primary osteoarthritis, right hip: Secondary | ICD-10-CM | POA: Diagnosis not present

## 2015-12-23 DIAGNOSIS — I272 Other secondary pulmonary hypertension: Secondary | ICD-10-CM

## 2015-12-23 DIAGNOSIS — N183 Chronic kidney disease, stage 3 unspecified: Secondary | ICD-10-CM

## 2015-12-23 DIAGNOSIS — I480 Paroxysmal atrial fibrillation: Secondary | ICD-10-CM | POA: Insufficient documentation

## 2015-12-23 DIAGNOSIS — E039 Hypothyroidism, unspecified: Secondary | ICD-10-CM | POA: Insufficient documentation

## 2015-12-23 DIAGNOSIS — I5032 Chronic diastolic (congestive) heart failure: Secondary | ICD-10-CM

## 2015-12-23 LAB — BASIC METABOLIC PANEL
Anion gap: 10 (ref 5–15)
BUN: 23 mg/dL — ABNORMAL HIGH (ref 6–20)
CO2: 25 mmol/L (ref 22–32)
Calcium: 9.7 mg/dL (ref 8.9–10.3)
Chloride: 105 mmol/L (ref 101–111)
Creatinine, Ser: 1.79 mg/dL — ABNORMAL HIGH (ref 0.44–1.00)
GFR calc Af Amer: 32 mL/min — ABNORMAL LOW (ref >60)
GFR calc non Af Amer: 27 mL/min — ABNORMAL LOW (ref >60)
Glucose, Bld: 86 mg/dL (ref 65–99)
Potassium: 4.1 mmol/L (ref 3.5–5.1)
Sodium: 140 mmol/L (ref 135–145)

## 2015-12-23 LAB — BRAIN NATRIURETIC PEPTIDE: B Natriuretic Peptide: 507.2 pg/mL — ABNORMAL HIGH (ref 0.0–100.0)

## 2015-12-23 NOTE — Progress Notes (Signed)
Patient ID: Tiffany [name], female   DOB: [birth year], 70 y.o.   MRN: [id number] PCP: Dr. Sharlet Salina Cardiology: Dr. Percival Spanish HF Cardiology: Dr. [name]  70 yo with history of paroxysmal atrial fibrillation and diastolic CHF was recently noted to have significant pulmonary hypertension.  She has had dyspnea for about a year now.  It has steadily worsened.  She has history of paroxysmal atrial fibrillation and had an episode in 6/16 requiring cardioversion.  She was started on amiodarone but this was stopped with worsening breathing.  Cardiolite in 9/16 showed on ischemia or infarction.     I had her do a RHC in 1/17.  This showed elevated left and right heart filling pressures but also PAH. After cath, she increased her Lasix from 40 qod to 40 daily.  At a prior appointment, I increased Lasix to 40 mg bid and also started her on Opsumit.  At next appointment, I added Adcirca 20 and then titrated it up to 40 mg daily.  At last appointment, she was volume overloaded with increased weight.  I increased Lasix to 80 qam/40 qpm.   She is doing much better today.  Weight is down 4 lbs.  6 minute walk was done today and showed improvement.  She is able to get around in the grocery store without dyspnea . She thinks that she can walk 100 yards with no problem.  Now able to walk up stairs at home without dyspnea.  No orthopnea/PND.  No palpitations.  She is in NSR today.    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   6 minute walk (2/17): 141 m 6 minute walk (3/17): 182 m 6 minute walk (5/17): 229 m  Labs (12/16): BNP 1187, ANA 1:640, TSH elevated. Labs (2/17): K 5.4 => 3.9, creatinine 1.78 => 1.66, LDL 112, BNP 880 Labs (4/17): K 4.5, creatinine 1.76  PMH:  1. CKD stage III 2. Bradycardia 3. Raynauds phenomenon. 4. BPPV 5. OA right hip 6. Hypothyroidism 7. Atrial fibrillation: Paroxysmal.  She was on amiodarone in the past but this was stopped when she became more short of  breath.  TEE-guided DCCV in 6/16.  8. HTN 9. 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 - TEE (6/16) with EF 45-50%, mildly dilated RV with mildly decreased systolic function, PASP 36 mmHg.  - RHC (1/17) with mean RA 9, PA 80/29 mean 52, mean PCWP 27, CI 1.97 Fick/1.8 thermo, PVR 7.6 Fick/8.4 thermo.  10. Pulmonary hypertension: See RHC above.  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  - TEE (6/16) with mildly dilated RV with mildly decreased systolic function. - ANA 0000000, anti-centromere antibody elevated, anti-SCL-70 antibody negative, h/o Raynauds - PFTs (7/16) with minimal obstructive defect but moderately decreased DLCO.  - V/Q scan (2/17) negative for acute or chronic PE.  11. Cardiolite (9/16) with no ischemia/infarction.  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   SH: Married, lives in [place], no smoking, no ETOH.   FH: Adopted.  Daughter has Raynaud's.  ROS: All systems reviewed and negative except as per HPI  Current Outpatient Prescriptions  Medication Sig Dispense Refill  . acetaminophen (TYLENOL) 325 MG tablet Take 2 tablets (650 mg total) by mouth every 4 (four) hours as needed for headache or mild pain.    . Calcium Carb-Cholecalciferol (CALCIUM 600 +  D PO) Take 1 tablet by mouth daily.    . diazepam (VALIUM) 5 MG tablet Take 1 tablet (5 mg total) by mouth every 8 (eight) hours as needed for anxiety. 30 tablet 0  . ELIQUIS 5 MG TABS tablet TAKE 1 TABLET BY MOUTH TWICE A DAY 60 tablet 5  . estradiol (ESTRACE) 0.1 MG/GM vaginal cream Place 1 g vaginally once a week.     . famotidine (PEPCID AC) 10 MG tablet Take 1 tablet (10 mg total) by mouth daily.    . fluticasone (FLONASE) 50 MCG/ACT nasal spray Place 1 spray into both nostrils daily as needed for allergies or rhinitis. 16 g 2  . furosemide (LASIX) 40 MG tablet Take 2 tabs in AM  and 1 tab in PM 90 tablet 6  . Macitentan (OPSUMIT) 10 MG TABS Take 10 mg by mouth daily. 30 tablet   . Multiple Vitamins-Minerals (CENTRUM SILVER) tablet Take 1 tablet by mouth daily.     . Omega 3 1000 MG CAPS Take 2,000 mg by mouth daily.    . potassium chloride SA (K-DUR,KLOR-CON) 20 MEQ tablet Take 1 tablet (20 mEq total) by mouth daily. 30 tablet 3  . Tadalafil, PAH, (ADCIRCA) 20 MG TABS Take 40 mg by mouth daily.     No current facility-administered medications for this encounter.   BP 128/80 mmHg  Pulse 84  Wt 156 lb (70.761 kg)  SpO2 98% General: NAD Neck: JVP 7 cm, no thyromegaly or thyroid nodule.  Lungs: Clear to auscultation bilaterally with normal respiratory effort. CV: Nondisplaced PMI.  Heart regular S1/S2, no S3/S4, 2/6 HSM LLSB.  No edema, bilateral venous varicosities lower legs/feet.  No carotid bruit.  Normal pedal pulses.  Abdomen: Soft, nontender, no hepatosplenomegaly, no distention.  Skin: Intact without lesions or rashes.  Neurologic: Alert and oriented x 3.  Psych: Normal affect. Extremities: No clubbing or cyanosis.  HEENT: Normal.   Assessment/Plan: 1. Pulmonary hypertension: Patient has pulmonary arterial hypertension.  She appears to have co-existing diastolic LV dysfunction given elevated PCWP on RHC.  PFTs did not show significant obstruction, they only showed moderately decreased DLCO consistent with pulmonary hypertension. V/Q scan did not show evidence for chronic PE.  PVR 7.6 WU by Fick and 8.4 WU by thermodiluation.  Cardiac index was low, 1.97 Fick/1.8 thermo.  Mildly dilated and dysfunctional RV on 6/16 TEE. 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   - Still waiting on sleep study.   - Continue Opsumit and Adcirca 40 daily.  Improved 6 minute walk.  I will get repeat echo in  6/17.   2. Chronic diastolic CHF: Elevated PCWP on 1/17 RHC.  TEE with EF 45-50% with mildly dilated and dysfunctional RV.  She looks euvolemic today.  NYHA class II symptoms.  - Continue Lasix 80 qam/40 qpm and  KCl 20 daily.  BMET/BNP today.  3. Atrial fibrillation: Paroxysmal.  She is in NSR today.  No significant palpitations.  If she has long breakthrough atrial fibrillation episodes, would consider amiodarone use (hold off on Tikosyn with elevated creatinine).  Continue Eliquis 5 mg bid.  4. CKD: Stage III. BMET today.   Followup in 2 months.     Loralie Champagne 12/23/2015

## 2015-12-23 NOTE — Progress Notes (Signed)
error 

## 2015-12-23 NOTE — Patient Instructions (Signed)
Labs today  Your physician has requested that you have an echocardiogram. Echocardiography is a painless test that uses sound waves to create images of your heart. It provides your doctor with information about the size and shape of your heart and how well your heart's chambers and valves are working. This procedure takes approximately one hour. There are no restrictions for this procedure.  Your physician recommends that you schedule a follow-up appointment in: 2 months

## 2015-12-23 NOTE — Progress Notes (Signed)
6 min walk completed, pt ambulated 750 ft (228.6 m) o2 sat ranged 94-91% on RA, HR ranged 89-127

## 2016-01-06 ENCOUNTER — Other Ambulatory Visit: Payer: Self-pay | Admitting: Cardiology

## 2016-01-07 ENCOUNTER — Ambulatory Visit (HOSPITAL_COMMUNITY): Payer: Medicare Other | Attending: Cardiology

## 2016-01-07 ENCOUNTER — Other Ambulatory Visit (HOSPITAL_COMMUNITY): Payer: Medicare Other

## 2016-01-07 ENCOUNTER — Other Ambulatory Visit: Payer: Self-pay

## 2016-01-07 DIAGNOSIS — E785 Hyperlipidemia, unspecified: Secondary | ICD-10-CM | POA: Insufficient documentation

## 2016-01-07 DIAGNOSIS — I119 Hypertensive heart disease without heart failure: Secondary | ICD-10-CM | POA: Diagnosis not present

## 2016-01-07 DIAGNOSIS — I272 Other secondary pulmonary hypertension: Secondary | ICD-10-CM

## 2016-01-07 DIAGNOSIS — I340 Nonrheumatic mitral (valve) insufficiency: Secondary | ICD-10-CM | POA: Diagnosis not present

## 2016-01-07 DIAGNOSIS — I071 Rheumatic tricuspid insufficiency: Secondary | ICD-10-CM | POA: Insufficient documentation

## 2016-01-07 DIAGNOSIS — I351 Nonrheumatic aortic (valve) insufficiency: Secondary | ICD-10-CM | POA: Insufficient documentation

## 2016-01-07 DIAGNOSIS — I371 Nonrheumatic pulmonary valve insufficiency: Secondary | ICD-10-CM | POA: Diagnosis not present

## 2016-01-08 ENCOUNTER — Encounter (HOSPITAL_COMMUNITY): Payer: Self-pay

## 2016-01-10 ENCOUNTER — Telehealth (HOSPITAL_COMMUNITY): Payer: Self-pay

## 2016-01-10 NOTE — Telephone Encounter (Signed)
Echo results reviewed with patient  Notes Recorded by Effie Berkshire, RN on 01/10/2016 at 4:09 PM Patient made aware of results. Asking for update on scheduling a sleep study per Dr. [name]. Will look into this for patient. Notes Recorded by Larey Dresser, MD on 01/08/2016 at 1:10 PM PA pressure remains high with RV dysfunction. She does continue to have effect on her heart from pulmonary hypertension.

## 2016-01-13 ENCOUNTER — Other Ambulatory Visit (HOSPITAL_COMMUNITY): Payer: Self-pay

## 2016-01-13 DIAGNOSIS — R0683 Snoring: Secondary | ICD-10-CM

## 2016-01-29 ENCOUNTER — Other Ambulatory Visit (HOSPITAL_COMMUNITY): Payer: Self-pay | Admitting: *Deleted

## 2016-01-29 MED ORDER — FUROSEMIDE 40 MG PO TABS: ORAL_TABLET | ORAL | Status: DC

## 2016-02-25 ENCOUNTER — Ambulatory Visit (HOSPITAL_COMMUNITY)
Admission: RE | Admit: 2016-02-25 | Discharge: 2016-02-25 | Disposition: A | Discharge status: Home / Self Care | Payer: Medicare Other | Source: Ambulatory Visit | Attending: Cardiology | Admitting: Cardiology

## 2016-02-25 VITALS — BP 112/74 | Wt 153.8 lb

## 2016-02-25 DIAGNOSIS — I7300 Raynaud's syndrome without gangrene: Secondary | ICD-10-CM | POA: Insufficient documentation

## 2016-02-25 DIAGNOSIS — R001 Bradycardia, unspecified: Secondary | ICD-10-CM | POA: Insufficient documentation

## 2016-02-25 DIAGNOSIS — E039 Hypothyroidism, unspecified: Secondary | ICD-10-CM | POA: Insufficient documentation

## 2016-02-25 DIAGNOSIS — I83893 Varicose veins of bilateral lower extremities with other complications: Secondary | ICD-10-CM | POA: Diagnosis not present

## 2016-02-25 DIAGNOSIS — M341 CR(E)ST syndrome: Secondary | ICD-10-CM | POA: Diagnosis not present

## 2016-02-25 DIAGNOSIS — M1611 Unilateral primary osteoarthritis, right hip: Secondary | ICD-10-CM | POA: Insufficient documentation

## 2016-02-25 DIAGNOSIS — Z7901 Long term (current) use of anticoagulants: Secondary | ICD-10-CM | POA: Diagnosis not present

## 2016-02-25 DIAGNOSIS — H8110 Benign paroxysmal vertigo, unspecified ear: Secondary | ICD-10-CM | POA: Insufficient documentation

## 2016-02-25 DIAGNOSIS — I130 Hypertensive heart and chronic kidney disease with heart failure and stage 1 through stage 4 chronic kidney disease, or unspecified chronic kidney disease: Secondary | ICD-10-CM | POA: Diagnosis not present

## 2016-02-25 DIAGNOSIS — I480 Paroxysmal atrial fibrillation: Secondary | ICD-10-CM | POA: Insufficient documentation

## 2016-02-25 DIAGNOSIS — I272 Other secondary pulmonary hypertension: Secondary | ICD-10-CM | POA: Insufficient documentation

## 2016-02-25 DIAGNOSIS — N183 Chronic kidney disease, stage 3 unspecified: Secondary | ICD-10-CM

## 2016-02-25 DIAGNOSIS — M25473 Effusion, unspecified ankle: Secondary | ICD-10-CM | POA: Diagnosis not present

## 2016-02-25 DIAGNOSIS — E1122 Type 2 diabetes mellitus with diabetic chronic kidney disease: Secondary | ICD-10-CM | POA: Diagnosis not present

## 2016-02-25 DIAGNOSIS — I4891 Unspecified atrial fibrillation: Secondary | ICD-10-CM

## 2016-02-25 DIAGNOSIS — I5032 Chronic diastolic (congestive) heart failure: Secondary | ICD-10-CM | POA: Diagnosis not present

## 2016-02-25 LAB — BASIC METABOLIC PANEL WITH GFR
Anion gap: 9 (ref 5–15)
BUN: 25 mg/dL — ABNORMAL HIGH (ref 6–20)
CO2: 26 mmol/L (ref 22–32)
Calcium: 9.4 mg/dL (ref 8.9–10.3)
Chloride: 104 mmol/L (ref 101–111)
Creatinine, Ser: 1.77 mg/dL — ABNORMAL HIGH (ref 0.44–1.00)
GFR calc Af Amer: 32 mL/min — ABNORMAL LOW
GFR calc non Af Amer: 28 mL/min — ABNORMAL LOW
Glucose, Bld: 101 mg/dL — ABNORMAL HIGH (ref 65–99)
Potassium: 5 mmol/L (ref 3.5–5.1)
Sodium: 139 mmol/L (ref 135–145)

## 2016-02-25 LAB — BRAIN NATRIURETIC PEPTIDE: B Natriuretic Peptide: 403.8 pg/mL — ABNORMAL HIGH (ref 0.0–100.0)

## 2016-02-25 NOTE — Patient Instructions (Signed)
Start Selexipag Tiffany [name]), [facility] will contact you  Labs today  Your physician recommends that you schedule a follow-up appointment in: 6 weeks

## 2016-02-26 ENCOUNTER — Telehealth (HOSPITAL_COMMUNITY): Payer: Self-pay | Admitting: *Deleted

## 2016-02-26 DIAGNOSIS — I272 Pulmonary hypertension, unspecified: Secondary | ICD-10-CM

## 2016-02-26 NOTE — Telephone Encounter (Signed)
Pt aware of lab result sand medication changes. Pt added to lab schedule 8/4

## 2016-02-26 NOTE — Telephone Encounter (Signed)
-----   Message from Larey Dresser, MD sent at 02/26/2016 12:10 AM EDT ----- Stop KCl supplement, check BMET 1 week.

## 2016-02-26 NOTE — Progress Notes (Signed)
Patient ID: Tiffany [name], female   DOB: 1946-06-21, 70 y.o.   MRN: [id number] PCP: Dr. Sharlet Salina Cardiology: Dr. Percival Spanish HF Cardiology: Dr. [name]  70 yo with history of paroxysmal atrial fibrillation and diastolic CHF was recently noted to have significant pulmonary hypertension.  She has had dyspnea for about a year now.  It has steadily worsened.  She has history of paroxysmal atrial fibrillation and had an episode in 6/16 requiring cardioversion.  She was started on amiodarone but this was stopped with worsening breathing.  Cardiolite in 9/16 showed on ischemia or infarction.     I had her do a RHC in 1/17.  This showed elevated left and right heart filling pressures but also PAH. After cath, she increased her Lasix from 40 qod to 40 daily.  At a prior appointment, I increased Lasix to 40 mg bid and also started her on Opsumit.  At next appointment, I added Adcirca 20 and then titrated it up to 40 mg daily.  At last appointment, she was volume overloaded with increased weight.  I increased Lasix to 80 qam/40 qpm.   Echo was done in 6/17, RV mildly dilated with moderately decreased systolic function and PASP 85 mmHg.    She had 2 episodes of irregular and fast heart rhythm, both at night and separated by about a week.  Both times, she fell asleep and woke but up again in normal rhythm.  She is concerned that these episodes were atrial fibrillation.  No lightheadedness, syncope, chest pain.  She is short of breath after walking about 5 minutes (200-300 feet).  This is stable.  Able to walk from parking lot to concert hall at [employer].  No PND, orthopnea.  Weight is down 3 lbs.    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   6 minute walk (2/17): 141 m 6 minute walk (3/17): 182 m 6 minute walk (5/17): 229 m  Labs (12/16): BNP 1187, ANA 1:640, TSH elevated. Labs (2/17): K 5.4 => 3.9, creatinine 1.78 => 1.66, LDL 112, BNP 880 Labs (4/17): K 4.5, creatinine 1.76 Labs  (5/17): K 4.1, creatinine 1.79  PMH:  1. CKD stage III 2. Bradycardia in setting of metoprolol + amiodarone use.  3. Raynauds phenomenon. 4. BPPV 5. OA right hip 6. Hypothyroidism 7. Atrial fibrillation: Paroxysmal.  She was on amiodarone in the past but this was stopped when she became more short of breath.  TEE-guided DCCV in 6/16.  8. HTN 9. 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 - TEE (6/16) with EF 45-50%, mildly dilated RV with mildly decreased systolic function, PASP 36 mmHg.  - RHC (1/17) with mean RA 9, PA 80/29 mean 52, mean PCWP 27, CI 1.97 Fick/1.8 thermo, PVR 7.6 Fick/8.4 thermo.  - Echo (6/17): EF 60-65%, mild MR, mild RV dilation with moderately decreased systolic function, moderate TR, PASP 85 mmHg.  10. Pulmonary hypertension: See RHC above.  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  - TEE (6/16) with mildly dilated RV with mildly decreased systolic function. - ANA 0000000, anti-centromere antibody elevated, anti-SCL-70 antibody negative, h/o Raynauds - PFTs (7/16) with minimal obstructive defect but moderately decreased DLCO.  - V/Q scan (2/17) negative for acute or chronic PE.  11. Cardiolite (9/16) with no ischemia/infarction.  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  13. Subclinical hypothyroidism.   SH: Married, lives in [place], no smoking, no ETOH.   FH: Adopted.  Daughter has Raynaud's.  ROS: All systems reviewed and negative except as per HPI  Current Outpatient Prescriptions  Medication Sig Dispense Refill  . acetaminophen (TYLENOL) 325 MG tablet Take 2 tablets (650 mg total) by mouth every 4 (four) hours as needed for headache or mild pain.    . Calcium Carb-Cholecalciferol (CALCIUM 600 + D PO) Take 1 tablet by mouth daily.    . diazepam (VALIUM) 5 MG tablet Take 1 tablet (5 mg total) by mouth every 8 (eight)  hours as needed for anxiety. 30 tablet 0  . ELIQUIS 5 MG TABS tablet TAKE 1 TABLET BY MOUTH TWICE A DAY 60 tablet 5  . estradiol (ESTRACE) 0.1 MG/GM vaginal cream Place 1 g vaginally once a week.     . famotidine (PEPCID AC) 10 MG tablet Take 1 tablet (10 mg total) by mouth daily.    . fluticasone (FLONASE) 50 MCG/ACT nasal spray Place 1 spray into both nostrils daily as needed for allergies or rhinitis. 16 g 2  . furosemide (LASIX) 40 MG tablet Take 2 tabs in AM and 1 tab in PM 270 tablet 3  . Macitentan (OPSUMIT) 10 MG TABS Take 10 mg by mouth daily. 30 tablet   . Multiple Vitamins-Minerals (CENTRUM SILVER) tablet Take 1 tablet by mouth daily.     . Omega 3 1000 MG CAPS Take 2,000 mg by mouth daily.    . potassium chloride SA (K-DUR,KLOR-CON) 20 MEQ tablet Take 1 tablet (20 mEq total) by mouth daily. 30 tablet 3  . Tadalafil, PAH, (ADCIRCA) 20 MG TABS Take 40 mg by mouth daily.     No current facility-administered medications for this encounter.    BP 112/74   Wt 153 lb 12 oz (69.7 kg)   BMI 30.03 kg/m  General: NAD Neck: JVP 7 cm, no thyromegaly or thyroid nodule.  Lungs: Clear to auscultation bilaterally with normal respiratory effort. CV: Nondisplaced PMI.  Heart regular S1/S2, no S3/S4, 2/6 HSM LLSB.  Trace ankle edema, bilateral venous varicosities lower legs/feet.  No carotid bruit.  Normal pedal pulses.  Abdomen: Soft, nontender, no hepatosplenomegaly, no distention.  Skin: Intact without lesions or rashes.  Neurologic: Alert and oriented x 3.  Psych: Normal affect. Extremities: No clubbing or cyanosis.  HEENT: Normal.   Assessment/Plan: 1. Pulmonary hypertension: Patient has pulmonary arterial hypertension.  She appears to have co-existing diastolic LV dysfunction given elevated PCWP on RHC.  PFTs did not show significant obstruction, they only showed moderately decreased DLCO consistent with pulmonary vascular disease. V/Q scan did not show evidence for chronic PE.  PVR 7.6  WU by Fick and 8.4 WU by thermodiluation.  Cardiac index was low, 1.97 Fick/1.8 thermo.  Most recent echo in 6/17 still showed elevated PA pressure with a dysfunctional RV. 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   - Still waiting on sleep study => will try again to arrange.   - Continue Opsumit and Adcirca 40 daily. She is still symptomatic and still has RV dysfunction/pulmonary hypertension by echo so I will start her on Selexipag.   2. Chronic diastolic CHF: Elevated PCWP on 1/17 RHC.  Last echo in 6/17 with EF 60-65%.  NYHA class II-III, volume looks ok on exam.  - Continue  Lasix 80 qam/40 qpm and  KCl 20 daily.  BMET/BNP today.  3. Atrial fibrillation: Paroxysmal.  She is in NSR today.  She has had 2 episodes at night that she suspects were atrial fibrillation.  We discussed starting a beta blocker but will hold off given history of bradycardia on a metoprolol in the past.   - If she has long breakthrough atrial fibrillation episodes, would consider Tikosyn though GFR may be prohibitive.  Amiodarone would be an option as well.   - Continue Eliquis 5 mg bid.  4. CKD: Stage III. BMET today.   Followup in 6 wks with 6 minute walk.     Loralie Champagne 02/26/2016

## 2016-02-26 NOTE — Addendum Note (Signed)
Addended by: Scarlette Calico on: 02/26/2016 05:10 PM   Modules accepted: Orders

## 2016-03-04 ENCOUNTER — Telehealth (HOSPITAL_COMMUNITY): Payer: Self-pay | Admitting: Pharmacist

## 2016-03-04 NOTE — Telephone Encounter (Signed)
Opsumit PA approved by [employer] through 08/02/16.   Ruta Hinds. Velva Harman, PharmD, BCPS, CPP Clinical Pharmacist Pager: [phone] Phone: [phone] 03/04/2016 4:04 PM

## 2016-03-06 ENCOUNTER — Ambulatory Visit (HOSPITAL_COMMUNITY)
Admission: RE | Admit: 2016-03-06 | Discharge: 2016-03-06 | Disposition: A | Discharge status: Home / Self Care | Payer: Medicare Other | Source: Ambulatory Visit | Attending: Cardiology | Admitting: Cardiology

## 2016-03-06 ENCOUNTER — Telehealth (HOSPITAL_COMMUNITY): Payer: Self-pay | Admitting: Cardiology

## 2016-03-06 DIAGNOSIS — I272 Other secondary pulmonary hypertension: Secondary | ICD-10-CM | POA: Diagnosis not present

## 2016-03-06 LAB — BASIC METABOLIC PANEL
Anion gap: 9 (ref 5–15)
BUN: 23 mg/dL — ABNORMAL HIGH (ref 6–20)
CO2: 26 mmol/L (ref 22–32)
Calcium: 9.5 mg/dL (ref 8.9–10.3)
Chloride: 102 mmol/L (ref 101–111)
Creatinine, Ser: 1.7 mg/dL — ABNORMAL HIGH (ref 0.44–1.00)
GFR calc Af Amer: 34 mL/min — ABNORMAL LOW (ref >60)
GFR calc non Af Amer: 29 mL/min — ABNORMAL LOW (ref >60)
Glucose, Bld: 86 mg/dL (ref 65–99)
Potassium: 3.1 mmol/L — ABNORMAL LOW (ref 3.5–5.1)
Sodium: 137 mmol/L (ref 135–145)

## 2016-03-06 MED ORDER — POTASSIUM CHLORIDE CRYS ER 20 MEQ PO TBCR: 20.0000 meq | EXTENDED_RELEASE_TABLET | Freq: Every day | ORAL | 3 refills | Status: DC

## 2016-03-06 NOTE — Telephone Encounter (Signed)
Pt aware and voiced understanding 

## 2016-03-13 ENCOUNTER — Other Ambulatory Visit (HOSPITAL_COMMUNITY): Payer: Self-pay | Admitting: Pharmacist

## 2016-03-13 MED ORDER — SELEXIPAG 200 MCG PO TABS: 200.0000 ug | ORAL_TABLET | Freq: Two times a day (BID) | ORAL | 11 refills | Status: DC

## 2016-03-24 ENCOUNTER — Telehealth (HOSPITAL_COMMUNITY): Payer: Self-pay | Admitting: Pharmacist

## 2016-03-24 NOTE — Telephone Encounter (Signed)
Uptravi titration pack PA approved by [employer] through 09/23/16.   Ruta Hinds. Velva Harman, PharmD, BCPS, CPP Clinical Pharmacist Pager: [phone] Phone: [phone] 03/24/2016 10:17 AM

## 2016-04-02 ENCOUNTER — Telehealth (HOSPITAL_COMMUNITY): Payer: Self-pay | Admitting: Pharmacist

## 2016-04-02 NOTE — Telephone Encounter (Signed)
Adcirca PA approved by [employer] through 08/02/16.   Ruta Hinds. Velva Harman, PharmD, BCPS, CPP Clinical Pharmacist Pager: [phone] Phone: [phone] 04/02/2016 12:29 PM

## 2016-04-07 ENCOUNTER — Ambulatory Visit (HOSPITAL_COMMUNITY)
Admission: RE | Admit: 2016-04-07 | Discharge: 2016-04-07 | Disposition: A | Discharge status: Home / Self Care | Payer: Medicare Other | Source: Ambulatory Visit | Attending: Cardiology | Admitting: Cardiology

## 2016-04-07 ENCOUNTER — Encounter (HOSPITAL_COMMUNITY): Payer: Self-pay

## 2016-04-07 VITALS — BP 120/74 | HR 87 | Wt 150.0 lb

## 2016-04-07 DIAGNOSIS — I272 Other secondary pulmonary hypertension: Secondary | ICD-10-CM | POA: Diagnosis not present

## 2016-04-07 DIAGNOSIS — Z7901 Long term (current) use of anticoagulants: Secondary | ICD-10-CM | POA: Diagnosis not present

## 2016-04-07 DIAGNOSIS — I130 Hypertensive heart and chronic kidney disease with heart failure and stage 1 through stage 4 chronic kidney disease, or unspecified chronic kidney disease: Secondary | ICD-10-CM | POA: Insufficient documentation

## 2016-04-07 DIAGNOSIS — I4891 Unspecified atrial fibrillation: Secondary | ICD-10-CM

## 2016-04-07 DIAGNOSIS — M1611 Unilateral primary osteoarthritis, right hip: Secondary | ICD-10-CM | POA: Diagnosis not present

## 2016-04-07 DIAGNOSIS — I480 Paroxysmal atrial fibrillation: Secondary | ICD-10-CM | POA: Diagnosis not present

## 2016-04-07 DIAGNOSIS — E039 Hypothyroidism, unspecified: Secondary | ICD-10-CM | POA: Diagnosis not present

## 2016-04-07 DIAGNOSIS — Z79899 Other long term (current) drug therapy: Secondary | ICD-10-CM | POA: Diagnosis not present

## 2016-04-07 DIAGNOSIS — I5032 Chronic diastolic (congestive) heart failure: Secondary | ICD-10-CM | POA: Insufficient documentation

## 2016-04-07 DIAGNOSIS — Z7951 Long term (current) use of inhaled steroids: Secondary | ICD-10-CM | POA: Diagnosis not present

## 2016-04-07 DIAGNOSIS — N183 Chronic kidney disease, stage 3 (moderate): Secondary | ICD-10-CM | POA: Diagnosis not present

## 2016-04-07 LAB — BASIC METABOLIC PANEL
Anion gap: 9 (ref 5–15)
BUN: 23 mg/dL — ABNORMAL HIGH (ref 6–20)
CO2: 25 mmol/L (ref 22–32)
Calcium: 10 mg/dL (ref 8.9–10.3)
Chloride: 104 mmol/L (ref 101–111)
Creatinine, Ser: 1.8 mg/dL — ABNORMAL HIGH (ref 0.44–1.00)
GFR calc Af Amer: 32 mL/min — ABNORMAL LOW (ref >60)
GFR calc non Af Amer: 27 mL/min — ABNORMAL LOW (ref >60)
Glucose, Bld: 91 mg/dL (ref 65–99)
Potassium: 3.9 mmol/L (ref 3.5–5.1)
Sodium: 138 mmol/L (ref 135–145)

## 2016-04-07 LAB — BRAIN NATRIURETIC PEPTIDE: B Natriuretic Peptide: 511.8 pg/mL — ABNORMAL HIGH (ref 0.0–100.0)

## 2016-04-07 NOTE — Patient Instructions (Signed)
Labs today  Your physician recommends that you schedule a follow-up appointment in: 6 weeks  

## 2016-04-07 NOTE — Progress Notes (Signed)
Patient ID: Tiffany [name], female   DOB: Dec 24, 1945, 70 y.o.   MRN: [id number] PCP: Dr. Sharlet Salina HF Cardiology: Dr. [name]  70 yo with history of paroxysmal atrial fibrillation and diastolic CHF was recently noted to have significant pulmonary hypertension.  She has had dyspnea for about a year now.  It has steadily worsened.  She has history of paroxysmal atrial fibrillation and had an episode in 6/16 requiring cardioversion.  She was started on amiodarone but this was stopped with worsening breathing.  Cardiolite in 9/16 showed on ischemia or infarction.     I had her do a RHC in 1/17.  This showed elevated left and right heart filling pressures but also PAH. After cath, she increased her Lasix from 40 qod to 40 daily.  At a prior appointment, I increased Lasix to 40 mg bid and also started her on Opsumit.  At next appointment, I added Adcirca 20 and then titrated it up to 40 mg daily.  At last appointment, she was volume overloaded with increased weight.  I increased Lasix to 80 qam/40 qpm.   Echo was done in 6/17, RV mildly dilated with moderately decreased systolic function and PASP 85 mmHg.    No further palpitations.  She seems to be doing rather well currently.  Mild dypsnea after walking about 200 yards but does not have to stop.  Her chronic cough has resolved.  No lightheadedness.  No chest pain. No orthopnea/PND. She started selexipag about 1 week ago.  No BRBPR or melena.     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   6 minute walk (2/17): 141 m 6 minute walk (3/17): 182 m 6 minute walk (5/17): 229 m  Labs (12/16): BNP 1187, ANA 1:640, TSH elevated. Labs (2/17): K 5.4 => 3.9, creatinine 1.78 => 1.66, LDL 112, BNP 880 Labs (4/17): K 4.5, creatinine 1.76 Labs (5/17): K 4.1, creatinine 1.79 Labs (8/17): K 3.1, creatinine 1.7, BNP 404  PMH:  1. CKD stage III 2. Bradycardia in setting of metoprolol + amiodarone use.  3. Raynauds phenomenon. 4.  BPPV 5. OA right hip 6. Hypothyroidism 7. Atrial fibrillation: Paroxysmal.  She was on amiodarone in the past but this was stopped when she became more short of breath.  TEE-guided DCCV in 6/16.  8. HTN 9. 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 - TEE (6/16) with EF 45-50%, mildly dilated RV with mildly decreased systolic function, PASP 36 mmHg.  - RHC (1/17) with mean RA 9, PA 80/29 mean 52, mean PCWP 27, CI 1.97 Fick/1.8 thermo, PVR 7.6 Fick/8.4 thermo.  - Echo (6/17): EF 60-65%, mild MR, mild RV dilation with moderately decreased systolic function, moderate TR, PASP 85 mmHg.  10. Pulmonary hypertension: See RHC above.  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  - TEE (6/16) with mildly dilated RV with mildly decreased systolic function. - ANA 0000000, anti-centromere antibody elevated, anti-SCL-70 antibody negative, h/o Raynauds - PFTs (7/16) with minimal obstructive defect but moderately decreased DLCO.  - V/Q scan (2/17) negative for acute or chronic PE.  11. Cardiolite (9/16) with no ischemia/infarction.  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  13. Subclinical hypothyroidism.   SH: Married, lives in [place], no smoking, no ETOH.   FH: Adopted.  Daughter has Raynaud's.  ROS: All systems reviewed and negative  except as per HPI  Current Outpatient Prescriptions  Medication Sig Dispense Refill  . acetaminophen (TYLENOL) 325 MG tablet Take 2 tablets (650 mg total) by mouth every 4 (four) hours as needed for headache or mild pain.    . Calcium Carb-Cholecalciferol (CALCIUM 600 + D PO) Take 1 tablet by mouth daily.    Marland Kitchen conjugated estrogens (PREMARIN) vaginal cream Place 1 Applicatorful vaginally daily.    . diazepam (VALIUM) 5 MG tablet Take 1 tablet (5 mg total) by mouth every 8 (eight) hours as needed for anxiety. 30 tablet 0  . ELIQUIS 5 MG  TABS tablet TAKE 1 TABLET BY MOUTH TWICE A DAY 60 tablet 5  . famotidine (PEPCID AC) 10 MG tablet Take 1 tablet (10 mg total) by mouth daily.    . fluticasone (FLONASE) 50 MCG/ACT nasal spray Place 1 spray into both nostrils daily as needed for allergies or rhinitis. 16 g 2  . furosemide (LASIX) 40 MG tablet Take 2 tabs in AM and 1 tab in PM 270 tablet 3  . Macitentan (OPSUMIT) 10 MG TABS Take 10 mg by mouth daily. 30 tablet   . Multiple Vitamins-Minerals (CENTRUM SILVER) tablet Take 1 tablet by mouth daily.     . Omega 3 1000 MG CAPS Take 2,000 mg by mouth daily.    . potassium chloride SA (K-DUR,KLOR-CON) 20 MEQ tablet Take 1 tablet (20 mEq total) by mouth daily. 90 tablet 3  . Selexipag (UPTRAVI) 200 MCG TABS Take 1 tablet (200 mcg total) by mouth 2 (two) times daily. 60 tablet 11  . Tadalafil, PAH, (ADCIRCA) 20 MG TABS Take 40 mg by mouth daily.     No current facility-administered medications for this encounter.    BP 120/74   Pulse 87   Wt 150 lb (68 kg)   SpO2 95%   BMI 29.29 kg/m  General: NAD Neck: JVP 7 cm, no thyromegaly or thyroid nodule.  Lungs: Clear to auscultation bilaterally with normal respiratory effort. CV: Nondisplaced PMI.  Heart regular S1/S2, no S3/S4, 2/6 HSM LLSB.  No edema, bilateral venous varicosities lower legs/feet.  No carotid bruit.  Normal pedal pulses.  Abdomen: Soft, nontender, no hepatosplenomegaly, no distention.  Skin: Intact without lesions or rashes.  Neurologic: Alert and oriented x 3.  Psych: Normal affect. Extremities: No clubbing or cyanosis.  HEENT: Normal.   Assessment/Plan: 1. Pulmonary hypertension: Patient has pulmonary arterial hypertension.  She appears to have co-existing diastolic LV dysfunction given elevated PCWP on RHC.  PFTs did not show significant obstruction, they only showed moderately decreased DLCO consistent with pulmonary vascular disease. V/Q scan did not show evidence for chronic PE.  PVR 7.6 WU by Fick and 8.4 WU by  thermodiluation.  Cardiac index was low, 1.97 Fick/1.8 thermo.  Most recent echo in 6/17 still showed elevated PA pressure with a dysfunctional RV. 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   - Sleep study will be this week. - Continue Opsumit and Adcirca 40 daily. She was still symptomatic and still had RV dysfunction/pulmonary hypertension by echo so Selexipag was started.  Currently on 200 mcg bid with plan to titrate up.    - 6 minute walk next appointment when she is on a higher dose of Selexipag.  2. Chronic diastolic CHF: Elevated PCWP on 1/17 RHC.  Last echo in 6/17 with EF 60-65%.  NYHA class II, volume looks ok on exam.  - Continue Lasix 80 qam/40 qpm and  KCl 20 daily.  BMET/BNP today.  3. Atrial fibrillation: Paroxysmal.  She is in NSR today.    - If she has long breakthrough atrial fibrillation episodes, would consider Tikosyn though GFR may be prohibitive.  Amiodarone would be an option as well.   - Continue Eliquis 5 mg bid.  4. CKD: Stage III. BMET today.   Followup in 6 wks with 6 minute walk.     Loralie Champagne 04/07/2016

## 2016-04-08 ENCOUNTER — Other Ambulatory Visit (HOSPITAL_COMMUNITY): Payer: Self-pay | Admitting: Cardiology

## 2016-04-10 ENCOUNTER — Ambulatory Visit (HOSPITAL_BASED_OUTPATIENT_CLINIC_OR_DEPARTMENT_OTHER): Payer: Medicare Other | Attending: Cardiology | Admitting: Cardiovascular Disease

## 2016-04-10 VITALS — Ht 60.0 in | Wt 147.0 lb

## 2016-04-10 DIAGNOSIS — Z7901 Long term (current) use of anticoagulants: Secondary | ICD-10-CM | POA: Insufficient documentation

## 2016-04-10 DIAGNOSIS — I272 Other secondary pulmonary hypertension: Secondary | ICD-10-CM | POA: Diagnosis not present

## 2016-04-10 DIAGNOSIS — R0683 Snoring: Secondary | ICD-10-CM | POA: Insufficient documentation

## 2016-04-10 DIAGNOSIS — Z79899 Other long term (current) drug therapy: Secondary | ICD-10-CM | POA: Diagnosis not present

## 2016-04-10 DIAGNOSIS — G4733 Obstructive sleep apnea (adult) (pediatric): Secondary | ICD-10-CM | POA: Diagnosis not present

## 2016-04-16 NOTE — Procedures (Signed)
Patient Name: Tiffany [name], Weismann Date: 04/10/2016 Gender: Female D.O.B: 1945/12/07 Age (years): 45 Referring Provider: Loralie Champagne Height (inches): 60 Interpreting Physician: Shelva Majestic MD, ABSM Weight (lbs): 147 RPSGT: Gerhard Perches BMI: 29 MRN: [id number] Neck Size: 13.00  CLINICAL INFORMATION Sleep Study Type: Split Night CPAP Indication for sleep study: Hypertension Epworth Sleepiness Score: 3  SLEEP STUDY TECHNIQUE As per the AASM Manual for the Scoring of Sleep and Associated Events v2.3 (April 2016) with a hypopnea requiring 4% desaturations. The channels recorded and monitored were frontal, central and occipital EEG, electrooculogram (EOG), submentalis EMG (chin), nasal and oral airflow, thoracic and abdominal wall motion, anterior tibialis EMG, snore microphone, electrocardiogram, and pulse oximetry. Continuous positive airway pressure (CPAP) was initiated when the patient met split night criteria and was titrated according to treat sleep-disordered breathing.  MEDICATIONS acetaminophen (TYLENOL) 325 MG tablet Calcium Carb-Cholecalciferol (CALCIUM 600 + D PO) conjugated estrogens (PREMARIN) vaginal cream diazepam (VALIUM) 5 MG tablet ELIQUIS 5 MG TABS tablet famotidine (PEPCID AC) 10 MG tablet fluticasone (FLONASE) 50 MCG/ACT nasal spray furosemide (LASIX) 40 MG tablet KLOR-CON M20 20 MEQ tablet Macitentan (OPSUMIT) 10 MG TABS Multiple Vitamins-Minerals (CENTRUM SILVER) tablet Omega 3 1000 MG CAPS potassium chloride SA (K-DUR,KLOR-CON) 20 MEQ tablet Selexipag (UPTRAVI) 200 MCG TABS Tadalafil, PAH, (ADCIRCA) 20 MG TABS  Medications administered by patient during sleep study : No sleep medicine administered.  RESPIRATORY PARAMETERS Diagnostic Total AHI (/hr): 59.4 RDI (/hr): 61.0 OA Index (/hr): 59.4 CA Index (/hr): 0 REM AHI (/hr): 0 NREM AHI (/hr): 74.7 Supine AHI (/hr): 76.1 Non-supine AHI (/hr):   Min O2 Sat (%): 80.0 Mean O2 (%): 89.6 Time  below 88% (min): 38     Titration Optimal Pressure (cm): 13 AHI at Optimal Pressure (/hr): 0 Min O2 at Optimal Pressure (%): 85.0 Supine % at Optimal (%): N/A Sleep % at Optimal (%): N/A      SLEEP ARCHITECTURE The recording time for the entire night was minutes. During a baseline period of 186.5 minutes, the patient slept for 30.5 minutes in REM and 118.1 minutes in  nonREM, yielding a sleep efficiency of 79.6%. Sleep onset after lights out was 14.5 minutes with a REM latency of 121.5 minutes. The patient spent 3.4% of the night in stage N1 sleep, 72.7% in stage N2 sleep, 3.4% in stage N3 and 20.5% in REM.   During the titration period of  193 minutes, the patient slept for 55.65mnutes in REM and nonREM, yielding a sleep efficiency of  28.8%. Sleep onset after CPAP initiation was 53.3 minutes with a REM latency of 130.5 minutes. The patient spent 4.5% of the night in stage N1 sleep, 82% in stage N2 sleep, 0% in stage N3 and 7.5% in REM.  CARDIAC DATA The 2 lead EKG demonstrated sinus rhythm. The mean heart rate was beats per minute. Other EKG findings include: None.  LEG MOVEMENT DATA The total Periodic Limb Movements of Sleep (PLMS) were  0. The PLMS index was  0.  IMPRESSIONS - Severe obstructive sleep apnea with an AHI of 59.4 during the diagnostic portion of the study. - No evidence for central apnea events. - Reduced sleep efficiency. - Oxygen desaturation to a nadir of 80%. - The patient snored with Moderate snoring volume during the diagnostic portion of the study. - No cardiac abnormalities were noted during this study.  DIAGNOSIS - Obstructive Sleep Apnea (327.23 [G47.33 ICD-10])  RECOMMENDATIONS - Recommend  an initial trial of CPAP therapy with EPR/C-Flex at  13 cm H2O with heated humidification.  A Small size Resmed Nasal Pillow Mask AirFit P10 mask was used for the titration. - Efforts should be made to optimize nasal and oropharyngeal patency. - Recommend nocturnal  oxygen saturation study on CPAP to further evaluate for  oxygen desaturation and potential for supplemental oxygen. - Avoid alcohol, sedatives and other CNS depressants that may worsen sleep apnea and disrupt normal sleep architecture. - Sleep hygiene should be reviewed to assess factors that may improve sleep quality. - Weight management and regular exercise should be initiated or continued. - Recommend a download be obtained in 30 days and sleep clinic evaluation.  [Electronically signed] 04/17/2016 10:59 PM  Shelva Majestic MD, [facility], [place], American Board of Sleep Medicine   NPI: [id number] [place] PH: [phone]   FX: [phone] [place]

## 2016-04-28 ENCOUNTER — Other Ambulatory Visit (HOSPITAL_COMMUNITY): Payer: Self-pay | Admitting: *Deleted

## 2016-04-28 ENCOUNTER — Telehealth: Payer: Self-pay | Admitting: *Deleted

## 2016-04-28 MED ORDER — MACITENTAN 10 MG PO TABS: 10.0000 mg | ORAL_TABLET | Freq: Every day | ORAL | 3 refills | Status: DC

## 2016-04-28 NOTE — Telephone Encounter (Signed)
Called and discussed patient's sleep study results and recommendations. Patient had many questions and concerns of which I addressed. Referral for CPAP will be sent to [employer] today for set up. Patient voiced her understanding of what was told to her concerning these results.

## 2016-05-01 ENCOUNTER — Telehealth (HOSPITAL_COMMUNITY): Payer: Self-pay

## 2016-05-01 NOTE — Telephone Encounter (Signed)
Agree, would not increase Uptravi.  Let's see if symptoms resolve on their own.

## 2016-05-01 NOTE — Telephone Encounter (Signed)
Patient called concerning symptoms since increasing uptravi about a week ago.  States she is having GI symptoms like nausea after eating, loose stools, abd pain.  Patient does not want to go up on uptravi as due tomorrow. Denies SOB, swelling, change in weight, CP, fever, dyspnea, diaphoresis. Advised this does not seem to be in relation to the medication, however advised not to increase uptravi tomorrow and to call us Monday to update and see if she feels better.  Also advised to call PCP for GI symptoms for further advice. Will forward to Dr. [name] to further advise as well.  Renee Pain, RN

## 2016-05-19 ENCOUNTER — Ambulatory Visit (HOSPITAL_COMMUNITY)
Admission: RE | Admit: 2016-05-19 | Discharge: 2016-05-19 | Disposition: A | Discharge status: Home / Self Care | Payer: Medicare Other | Source: Ambulatory Visit | Attending: Cardiology | Admitting: Cardiology

## 2016-05-19 ENCOUNTER — Encounter (HOSPITAL_COMMUNITY): Payer: Self-pay

## 2016-05-19 VITALS — BP 120/76 | HR 85 | Wt 151.0 lb

## 2016-05-19 DIAGNOSIS — I272 Pulmonary hypertension, unspecified: Secondary | ICD-10-CM

## 2016-05-19 DIAGNOSIS — Z79899 Other long term (current) drug therapy: Secondary | ICD-10-CM | POA: Diagnosis not present

## 2016-05-19 DIAGNOSIS — E039 Hypothyroidism, unspecified: Secondary | ICD-10-CM | POA: Insufficient documentation

## 2016-05-19 DIAGNOSIS — Z7901 Long term (current) use of anticoagulants: Secondary | ICD-10-CM | POA: Insufficient documentation

## 2016-05-19 DIAGNOSIS — I130 Hypertensive heart and chronic kidney disease with heart failure and stage 1 through stage 4 chronic kidney disease, or unspecified chronic kidney disease: Secondary | ICD-10-CM | POA: Diagnosis not present

## 2016-05-19 DIAGNOSIS — N183 Chronic kidney disease, stage 3 unspecified: Secondary | ICD-10-CM

## 2016-05-19 DIAGNOSIS — I7300 Raynaud's syndrome without gangrene: Secondary | ICD-10-CM | POA: Diagnosis not present

## 2016-05-19 DIAGNOSIS — I5032 Chronic diastolic (congestive) heart failure: Secondary | ICD-10-CM | POA: Diagnosis not present

## 2016-05-19 DIAGNOSIS — I2721 Secondary pulmonary arterial hypertension: Secondary | ICD-10-CM | POA: Diagnosis not present

## 2016-05-19 DIAGNOSIS — I480 Paroxysmal atrial fibrillation: Secondary | ICD-10-CM | POA: Insufficient documentation

## 2016-05-19 DIAGNOSIS — G4733 Obstructive sleep apnea (adult) (pediatric): Secondary | ICD-10-CM | POA: Insufficient documentation

## 2016-05-19 MED ORDER — FUROSEMIDE 40 MG PO TABS
80.0000 mg | ORAL_TABLET | Freq: Every day | ORAL | 3 refills | Status: DC
Start: 2016-05-19 — End: 2016-09-04

## 2016-05-19 NOTE — Patient Instructions (Signed)
Decrease Furosemide to 80 mg daily  We will contact you in 3 months to schedule your next appointment.

## 2016-05-19 NOTE — Progress Notes (Signed)
6 min walk test completed, pt ambulated 1000 ft (304.8 m).

## 2016-05-20 NOTE — Progress Notes (Signed)
Patient ID: [name], female   DOB: [birth year], 70 y.o.   MRN: [id number] PCP: Dr. Sharlet Salina HF Cardiology: Dr. [name]  70 yo with history of paroxysmal atrial fibrillation and diastolic CHF was recently noted to have significant pulmonary hypertension.  She has had dyspnea for about a year now.  It has steadily worsened.  She has history of paroxysmal atrial fibrillation and had an episode in 6/16 requiring cardioversion.  She was started on amiodarone but this was stopped with worsening breathing.  Cardiolite in 9/16 showed on ischemia or infarction.     I had her do a RHC in 1/17.  This showed elevated left and right heart filling pressures but also PAH. After cath, she increased her Lasix from 40 qod to 40 daily.  At a prior appointment, I increased Lasix to 40 mg bid and also started her on Opsumit.  At next appointment, I added Adcirca 20 and then titrated it up to 40 mg daily.  At last appointment, she was volume overloaded with increased weight.  I increased Lasix to 80 qam/40 qpm.   Echo was done in 6/17, RV mildly dilated with moderately decreased systolic function and PASP 85 mmHg.    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   Since last visit, Malvin Johns was started and titrated up to 800 mcg bid.  At this dose, she developed diarrhea and poor appetite.  This has been slowly improving.  She feels like she is breathing much better since starting Uptravi. No dyspnea walking on flat ground.  OK with stairs.  Able to help her daughter move recently.  No palpitations or chest pain.  No melena or BRBPR.   6 minute walk (2/17): 141 m 6 minute walk (3/17): 182 m 6 minute walk (5/17): 229 m 6 minute walk (10/17): 305 m  Labs (12/16): BNP 1187, ANA 1:640, TSH elevated. Labs (2/17): K 5.4 => 3.9, creatinine 1.78 => 1.66, LDL 112, BNP 880 Labs (4/17): K 4.5, creatinine 1.76 Labs (5/17): K 4.1, creatinine 1.79 Labs (8/17): K 3.1, creatinine 1.7, BNP 404 Labs  (9/17): K 3.9, creatinine 1.8, BNP 512  PMH:  1. CKD stage III 2. Bradycardia in setting of metoprolol + amiodarone use.  3. Raynauds phenomenon. 4. BPPV 5. OA right hip 6. Hypothyroidism 7. Atrial fibrillation: Paroxysmal.  She was on amiodarone in the past but this was stopped when she became more short of breath.  TEE-guided DCCV in 6/16.  8. HTN 9. 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 - TEE (6/16) with EF 45-50%, mildly dilated RV with mildly decreased systolic function, PASP 36 mmHg.  - RHC (1/17) with mean RA 9, PA 80/29 mean 52, mean PCWP 27, CI 1.97 Fick/1.8 thermo, PVR 7.6 Fick/8.4 thermo.  - Echo (6/17): EF 60-65%, mild MR, mild RV dilation with moderately decreased systolic function, moderate TR, PASP 85 mmHg.  10. Pulmonary hypertension: See RHC above.  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  - TEE (6/16) with mildly dilated RV with mildly decreased systolic function. - ANA 9:326, anti-centromere antibody elevated, anti-SCL-70 antibody negative, h/o Raynauds - PFTs (7/16) with minimal obstructive defect but moderately decreased DLCO.  - V/Q scan (2/17) negative for acute or chronic PE.  11. Cardiolite (9/16) with no ischemia/infarction.  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  13. Subclinical hypothyroidism.  14. OSA: Severe on 9/17 sleep study.   SH: Married, lives in [address], no smoking, no ETOH.   FH: Adopted.  Daughter has Raynaud's.  ROS: All systems reviewed and negative except as per HPI  Current Outpatient Prescriptions  Medication Sig Dispense Refill  . Calcium Carb-Cholecalciferol (CALCIUM 600 + D PO) Take 1 tablet by mouth daily.    Marland Kitchen conjugated estrogens (PREMARIN) vaginal cream Place 1 Applicatorful vaginally daily.    . diazepam (VALIUM) 5 MG tablet Take 1 tablet (5 mg total) by mouth every 8 (eight)  hours as needed for anxiety. 30 tablet 0  . ELIQUIS 5 MG TABS tablet TAKE 1 TABLET BY MOUTH TWICE A DAY 60 tablet 5  . famotidine (PEPCID AC) 10 MG tablet Take 1 tablet (10 mg total) by mouth daily.    . fluticasone (FLONASE) 50 MCG/ACT nasal spray Place 1 spray into both nostrils daily as needed for allergies or rhinitis. 16 g 2  . furosemide (LASIX) 40 MG tablet Take 2 tablets (80 mg total) by mouth daily. 270 tablet 3  . KLOR-CON M20 20 MEQ tablet TAKE 1 TABLET (20 MEQ TOTAL) BY MOUTH DAILY. 30 tablet 3  . Macitentan (OPSUMIT) 10 MG TABS Take 1 tablet (10 mg total) by mouth daily. 30 tablet 3  . Multiple Vitamins-Minerals (CENTRUM SILVER) tablet Take 1 tablet by mouth daily.     . Omega 3 1000 MG CAPS Take 2,000 mg by mouth daily.    . Selexipag (UPTRAVI) 800 MCG TABS Take 800 mcg by mouth 2 (two) times daily.    . Tadalafil, PAH, (ADCIRCA) 20 MG TABS Take 40 mg by mouth daily.    Marland Kitchen acetaminophen (TYLENOL) 325 MG tablet Take 2 tablets (650 mg total) by mouth every 4 (four) hours as needed for headache or mild pain. (Patient not taking: Reported on 05/19/2016)     No current facility-administered medications for this encounter.    BP 120/76   Pulse 85   Wt 151 lb (68.5 kg)   SpO2 96%   BMI 29.49 kg/m  General: NAD Neck: JVP 7 cm, no thyromegaly or thyroid nodule.  Lungs: Clear to auscultation bilaterally with normal respiratory effort. CV: Nondisplaced PMI.  Heart regular S1/S2, no S3/S4, 1/6 HSM LLSB.  No edema, bilateral venous varicosities lower legs/feet.  No carotid bruit.  Normal pedal pulses.  Abdomen: Soft, nontender, no hepatosplenomegaly, no distention.  Skin: Intact without lesions or rashes.  Neurologic: Alert and oriented x 3.  Psych: Normal affect. Extremities: No clubbing or cyanosis.  HEENT: Normal.   Assessment/Plan: 1. Pulmonary hypertension: Patient has pulmonary arterial hypertension.  She appears to have co-existing diastolic LV dysfunction given elevated PCWP  on RHC.  PFTs did not show significant obstruction, they only showed moderately decreased DLCO consistent with pulmonary vascular disease. V/Q scan did not show evidence for chronic PE.  PVR 7.6 WU by Fick and 8.4 WU by thermodiluation.  Cardiac index was low, 1.97 Fick/1.8 thermo.  Most recent echo in 6/17 still showed elevated PA pressure with a dysfunctional RV. 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  6 minute walk is improved since selexipag was started.  - Severe sleep apnea on recent sleep study.  I recommended that she try CPAP.  - Continue Opsumit and Adcirca 40 daily.  - She has titrated up  to 800 mcg bid of selexipag.  Given side effects at 800, I am not sure that we will be able to increase it further.  2. Chronic diastolic CHF: Elevated PCWP on 1/17 RHC.  Last echo in 6/17 with EF 60-65%.  NYHA class II, volume looks ok on exam.  - She can try decreasing Lasix to 80 mg daily.  She can take 40 qpm prn.    3. Atrial fibrillation: Paroxysmal.  She is in NSR today.    - If she has long breakthrough atrial fibrillation episodes, would consider Tikosyn though GFR may be prohibitive.  Amiodarone would be an option as well.   - Continue Eliquis 5 mg bid.  4. CKD: Stage III.   There is no cardiac contraindication to her having cataract surgery.    Loralie Champagne 05/20/2016

## 2016-05-29 ENCOUNTER — Other Ambulatory Visit (HOSPITAL_COMMUNITY): Payer: Self-pay | Admitting: *Deleted

## 2016-05-29 ENCOUNTER — Other Ambulatory Visit: Payer: Self-pay | Admitting: Pharmacist Clinician (PhC)/ Clinical Pharmacy Specialist

## 2016-05-29 MED ORDER — APIXABAN 5 MG PO TABS: 5.0000 mg | ORAL_TABLET | Freq: Two times a day (BID) | ORAL | 1 refills | Status: DC

## 2016-05-29 MED ORDER — POTASSIUM CHLORIDE CRYS ER 20 MEQ PO TBCR: 20.0000 meq | EXTENDED_RELEASE_TABLET | Freq: Every day | ORAL | 3 refills | Status: DC

## 2016-06-03 ENCOUNTER — Telehealth (HOSPITAL_COMMUNITY): Payer: Self-pay | Admitting: *Deleted

## 2016-06-03 NOTE — Telephone Encounter (Signed)
Surgical Clearance faxed to [facility] [phone]

## 2016-06-04 ENCOUNTER — Other Ambulatory Visit (HOSPITAL_COMMUNITY): Payer: Self-pay | Admitting: Pharmacist

## 2016-06-04 ENCOUNTER — Telehealth (HOSPITAL_COMMUNITY): Payer: Self-pay | Admitting: *Deleted

## 2016-06-04 MED ORDER — SELEXIPAG 600 MCG PO TABS: 600.0000 ug | ORAL_TABLET | Freq: Two times a day (BID) | ORAL | 11 refills | Status: DC

## 2016-06-04 NOTE — Telephone Encounter (Signed)
Received note from [facility], pt needs clearance to have cataract extraction in left eye followed by right eye.  Per Dr [name] pt ok to proceed, form signed and faxed back to them at [phone]

## 2016-07-04 ENCOUNTER — Other Ambulatory Visit: Payer: Self-pay | Admitting: Internal Medicine

## 2016-07-06 NOTE — Telephone Encounter (Signed)
Faxed to pharmacy

## 2016-07-17 ENCOUNTER — Ambulatory Visit (INDEPENDENT_AMBULATORY_CARE_PROVIDER_SITE_OTHER): Payer: Medicare Other | Admitting: Cardiovascular Disease

## 2016-07-17 ENCOUNTER — Encounter: Payer: Self-pay | Admitting: Cardiovascular Disease

## 2016-07-17 VITALS — BP 115/74 | HR 96 | Ht 60.0 in | Wt 151.8 lb

## 2016-07-17 DIAGNOSIS — I5032 Chronic diastolic (congestive) heart failure: Secondary | ICD-10-CM | POA: Diagnosis not present

## 2016-07-17 DIAGNOSIS — I7300 Raynaud's syndrome without gangrene: Secondary | ICD-10-CM

## 2016-07-17 DIAGNOSIS — G4733 Obstructive sleep apnea (adult) (pediatric): Secondary | ICD-10-CM

## 2016-07-17 DIAGNOSIS — I272 Pulmonary hypertension, unspecified: Secondary | ICD-10-CM | POA: Diagnosis not present

## 2016-07-17 DIAGNOSIS — I480 Paroxysmal atrial fibrillation: Secondary | ICD-10-CM

## 2016-07-17 DIAGNOSIS — R0600 Dyspnea, unspecified: Secondary | ICD-10-CM

## 2016-07-17 NOTE — Patient Instructions (Signed)
Your physician wants you to follow-up in: 1 year or sooner if needed for sleep. You will receive a reminder letter in the mail two months in advance. If you don't receive a letter, please call our office to schedule the follow-up appointment.

## 2016-07-20 ENCOUNTER — Telehealth (HOSPITAL_COMMUNITY): Payer: Self-pay | Admitting: Pharmacist

## 2016-07-20 NOTE — Telephone Encounter (Signed)
Opsumit and Adcirca PA approved by [employer] through 08/02/17.   Ruta Hinds. Velva Harman, PharmD, BCPS, CPP Clinical Pharmacist Pager: [phone] Phone: [phone] 07/20/2016 3:38 PM

## 2016-07-20 NOTE — Progress Notes (Signed)
Cardiology Office Note    Date:  07/20/2016   ID:  DASIAH HOOLEY, DOB [birth year], MRN [id number]  PCP:  [name], MD  Cardiologist:  Shelva Majestic, MD (sleep); Dr. [name]  New sleep evaluation  History of Present Illness:  Tiffany [name] is a 70 y.o. female who presents for sleep clinic following initiation of CPAP therapy.  She is followed by Dr. [name].  Ms. withdrew has a history of paroxysmal atrial fibrillation and diastolic heart failure and was found to have pulmonary hypertension.  She admits to dyspnea.  She had developed an episode of atrial fibrillation and underwent cardioversion in June 2016.  She was started on amiodarone but this was stopped secondary to dyspnea.  A nuclear perfusion study in September 2016 did not reveal ischemia or infarction.  She was found to have elevated left and right heart filling pressures as well as pulmonary hypertension at catheterization and her PA pressure was increased up to 85 mm on echo.  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  Because of her pulmonary hypertension, she was referred for a sleep study which was done on 04/10/2016.  She met split-night criteria and was found to have severe obstructive sleep apnea with an HIF 59.4 per hour on the diagnostic study.  She did not have evidence for central apneic events.  Oxygen desaturated to a nadir of 80%.  There was moderate snoring.  She underwent CPAP titration and a 13 cm water pressure was recommended.  Her CPAP was set up following her CPAP titration.  A download obtained from 06/13/2016 through 07/12/2016 shows 100% compliance abuse and usage greater than 4 hours.  She is averaging 9 hours and 13 minutes of sleep per night.  At 13.  Senna meter water pressure, AHI is 2.5.  She is sleeping well.  She is unaware of breakthrough snoring.  She denies frequent awakenings.  There is no daytime sleepiness.  In a form sleepiness scale was calculated and  endorsed at 2 shown below.  Epworth Sleepiness Scale: Situation   Chance of Dozing/Sleeping (0 = never , 1 = slight chance , 2 = moderate chance , 3 = high chance )   sitting and reading 1   watching TV 0   sitting inactive in a public place 0   being a passenger in a motor vehicle for an hour or more 0   lying down in the afternoon 1   sitting and talking to someone 0   sitting quietly after lunch (no alcohol) 0   while stopped for a few minutes in traffic as the driver 0   Total Score  2      Past Medical History:  Diagnosis Date  . Arthritis 04-20-12   Osteoarthritis-right hip  . Atrial fibrillation status post cardioversion, 01/30/15 maintaining SR.  01/31/2015   a. 01/2015 s/p TEE/DCCV;  b. 02/06/2015 recurrent AF noted, amio added;  c. CHA2DS2VASc = 3-->eliquis.  Marland Kitchen BENIGN POSITIONAL VERTIGO   . Cataract   . Complication of anesthesia 04-20-12   some issues with prolonged sedation after anesthesia  . Diverticulosis   . DYSLIPIDEMIA   . Esophageal stricture   . GERD   . HYPERTENSION    a. severe, pt intol of med tx, Pt. has severe "whitecoat" syndrome and refused medical therapy.  . Hypothyroidism 09/29/2014   a. pt did not tolerate synthroid and this was subsequently discontinued.  . Mild LV dysfunction  a. 01/2015 Echo: EF 45-50%, mild MR, mildly dil LA, mod dil RV with mod to sev reduced fxn, mod-sev TR, PASP 40mHg.  . Osteopenia   . Raynaud disease   . URINARY INCONTINENCE 04-20-12   occ. with nighttime sleep pattern    Past Surgical History:  Procedure Laterality Date  . breast ultrasound Right 09/13/13   There is no sonographic evidence of malignancy. the 07.8GNcomplicated cyst in (R) breast is consistent with a benign finding. repeat in 1 year  . CARDIAC CATHETERIZATION N/A 08/29/2015   Procedure: Right Heart Cath;  Surgeon: DLarey Dresser MD;  Location[facility];  Service: Cardiovascular;  Laterality: N/A;  . CARDIOVERSION N/A 01/30/2015   Procedure:  CARDIOVERSION;  Surgeon: MSanda Klein MD;  Location: [facility];  Service: Cardiovascular;  Laterality: N/A;  . CHOLECYSTECTOMY     '90-"sludge"  . NASAL FRACTURE SURGERY  2006  . TEE WITHOUT CARDIOVERSION N/A 01/30/2015   Procedure: TRANSESOPHAGEAL ECHOCARDIOGRAM (TEE);  Surgeon: MSanda Klein MD;  [facility];  Service: Cardiovascular;  Laterality: N/A;  . TOTAL HIP ARTHROPLASTY  04/26/2012   Procedure: TOTAL HIP ARTHROPLASTY ANTERIOR APPROACH;  Surgeon: MMauri Pole MD;  Location: [facility];  Service: Orthopedics;  Laterality: Right;  . TUBAL LIGATION  1980    Current Medications: Outpatient Medications Prior to Visit  Medication Sig Dispense Refill  . acetaminophen (TYLENOL) 325 MG tablet Take 2 tablets (650 mg total) by mouth every 4 (four) hours as needed for headache or mild pain.    .Marland Kitchenapixaban (ELIQUIS) 5 MG TABS tablet Take 1 tablet (5 mg total) by mouth 2 (two) times daily. 180 tablet 1  . Calcium Carb-Cholecalciferol (CALCIUM 600 + D PO) Take 1 tablet by mouth daily.    .Marland Kitchenconjugated estrogens (PREMARIN) vaginal cream Place 1 Applicatorful vaginally daily.    . diazepam (VALIUM) 5 MG tablet TAKE 1 TABLET BY MOUTH EVERY 8 HOURS AS NEEDED FOR ANXIETY 30 tablet 2  . fluticasone (FLONASE) 50 MCG/ACT nasal spray Place 1 spray into both nostrils daily as needed for allergies or rhinitis. 16 g 2  . furosemide (LASIX) 40 MG tablet Take 2 tablets (80 mg total) by mouth daily. 270 tablet 3  . Macitentan (OPSUMIT) 10 MG TABS Take 1 tablet (10 mg total) by mouth daily. 30 tablet 3  . Multiple Vitamins-Minerals (CENTRUM SILVER) tablet Take 1 tablet by mouth daily.     . potassium chloride SA (KLOR-CON M20) 20 MEQ tablet Take 1 tablet (20 mEq total) by mouth daily. 90 tablet 3  . Selexipag (UPTRAVI) 600 MCG TABS Take 600 mcg by mouth 2 (two) times daily. 60 tablet 11  . Tadalafil, PAH, (ADCIRCA) 20 MG TABS Take 40 mg by mouth daily.    . famotidine (PEPCID AC) 10 MG tablet Take  1 tablet (10 mg total) by mouth daily.    . Omega 3 1000 MG CAPS Take 2,000 mg by mouth daily.     No facility-administered medications prior to visit.      Allergies:   Levothyroxine; Statins; and Penicillins   Social History   Social History  . Marital status: Married    Spouse name: N/A  . Number of children: 2  . Years of education: N/A   Social History Main Topics  . Smoking status: Never Smoker  . Smokeless tobacco: Never Used  . Alcohol use 1.2 oz/week    2 Glasses of wine per week     Comment: several  glasses wine weekly  . Drug use: No  . Sexual activity: Yes   Other Topics Concern  . None   Social History Narrative   She enjoys birding.  Lives at home with husband.   Married.   retired [employer]arry manager, Tax inspector     Family History:  The patient's family history is not on file. She was adopted.   ROS General: Negative; No fevers, chills, or night sweats;  HEENT: Negative; No changes in vision or hearing, sinus congestion, difficulty swallowing Pulmonary: Negative; No cough, wheezing, shortness of breath, hemoptysis Cardiovascular: Negative; No chest pain, presyncope, syncope, palpitations GI: Negative; No nausea, vomiting, diarrhea, or abdominal pain GU: Negative; No dysuria, hematuria, or difficulty voiding Musculoskeletal: Negative; no myalgias, joint pain, or weakness Hematologic/Oncology: Negative; no easy bruising, bleeding Endocrine: Negative; no heat/cold intolerance; no diabetes Neuro: Negative; no changes in balance, headaches Skin: Negative; No rashes or skin lesions Psychiatric: Negative; No behavioral problems, depression Sleep: Positive for severe sleep apnea.  Since initiating CPAP therapy there is no residual snoring, daytime sleepiness, hypersomnolence, bruxism, restless legs, hypnogognic hallucinations, no cataplexy Other comprehensive 14 point system review is negative.   PHYSICAL EXAM:   VS:  BP 115/74   Pulse 96    Ht 5' (1.524 m)   Wt 151 lb 12.8 oz (68.9 kg)   BMI 29.65 kg/m    Wt Readings from Last 3 Encounters:  07/17/16 151 lb 12.8 oz (68.9 kg)  05/19/16 151 lb (68.5 kg)  04/10/16 147 lb (66.7 kg)    General: Alert, oriented, no distress.  Skin: normal turgor, no rashes, warm and dry HEENT: Normocephalic, atraumatic. Pupils equal round and reactive to light; sclera anicteric; extraocular muscles intact; Fundi Without hemorrhages or exudates. Nose without nasal septal hypertrophy Mouth/Parynx benign; Mallinpatti scale 3 Neck: No JVD, no carotid bruits; normal carotid upstroke Lungs: clear to ausculatation and percussion; no wheezing or rales Chest wall: without tenderness to palpitation Heart: PMI not displaced, RRR, s1 s2 normal, 1/6 systolic murmur, no diastolic murmur, no rubs, gallops, thrills, or heaves Abdomen: soft, nontender; no hepatosplenomehaly, BS+; abdominal aorta nontender and not dilated by palpation. Back: no CVA tenderness Pulses 2+ Musculoskeletal: full range of motion, normal strength, no joint deformities Extremities: no clubbing cyanosis or edema, Homan's sign negative  Neurologic: grossly nonfocal; Cranial nerves grossly wnl Psychologic: Normal mood and affect   Studies/Labs Reviewed:   EKG:  EKG is not  ordered today.  An independent review of her last ECG from 08/08/2015 reveals normal sinus rhythm at 70 bpm.  There is left axis deviation and incomplete right bundle branch block.  Recent Labs: BMP Latest Ref Rng & Units 04/07/2016 03/06/2016 02/25/2016  Glucose 65 - 99 mg/dL 91 86 101(H)  BUN 6 - 20 mg/dL 23(H) 23(H) 25(H)  Creatinine 0.44 - 1.00 mg/dL 1.80(H) 1.70(H) 1.77(H)  Sodium 135 - 145 mmol/L 138 137 139  Potassium 3.5 - 5.1 mmol/L 3.9 3.1(L) 5.0  Chloride 101 - 111 mmol/L 104 102 104  CO2 22 - 32 mmol/L _0 Calcium 8.9 - 10.3 mg/dL 10.0 9.5 9.4     Hepatic Function Latest Ref Rng & Units 09/30/2015 08/08/2015 07/15/2015  Total Protein 6.0 - 8.3  g/dL 7.3 6.3(L) 6.6  Albumin 3.5 - 5.2 g/dL 3.9 3.1(L) 3.8  AST 0 - 37 U/L _1 ALT 0 - 35 U/L 25 30 36(H)  Alk Phosphatase 39 - 117 U/L 63 71 85  Total  Bilirubin 0.2 - 1.2 mg/dL 0.8 0.9 0.8  Bilirubin, Direct 0.0 - 0.3 mg/dL - - -    CBC Latest Ref Rng & Units 08/26/2015 08/09/2015 08/08/2015  WBC 4.0 - 10.5 K/uL 8.0 5.1 6.4  Hemoglobin 12.0 - 15.0 g/dL 16.0(H) 13.7 14.2  Hematocrit 36.0 - 46.0 % 48.0(H) 42.5 44.3  Platelets 150 - 400 K/uL 239 175 194   Lab Results  Component Value Date   MCV 95.0 08/26/2015   MCV 95.9 08/09/2015   MCV 96.7 08/08/2015   Lab Results  Component Value Date   TSH 7.81 (H) 07/15/2015   Lab Results  Component Value Date   HGBA1C 5.3 02/13/2015     BNP    Component Value Date/Time   BNP 511.8 (H) 04/07/2016 1200    ProBNP    Component Value Date/Time   PROBNP 880.0 (H) 09/30/2015 1055     Lipid Panel     Component Value Date/Time   CHOL 201 (H) 09/30/2015 1055   TRIG 157.0 (H) 09/30/2015 1055   TRIG 185 02/11/2009   HDL 57.30 09/30/2015 1055   CHOLHDL 4 09/30/2015 1055   VLDL 31.4 09/30/2015 1055   LDLCALC 112 (H) 09/30/2015 1055     RADIOLOGY: No results found.   Additional studies/ records that were reviewed today include:  I review the office records of Dr. [name], the patient's sleep study, ECG, and most recent download.    ASSESSMENT:    1. OSA (obstructive sleep apnea)   2. Pulmonary hypertension   3. Chronic diastolic CHF (congestive heart failure) (HCC)   4. Paroxysmal atrial fibrillation ([place])   5. Dyspnea, unspecified type   6. Raynaud's disease without gangrene      PLAN:  Ms. Vaishnavi Dalby is a very pleasant 70 year old female who has a history of paroxysmal atrial fibrillation and diastolic heart failure and was found to have significant pulmonary hypertension.  I reviewed her sleep study in detail with her, which demonstrates severe sleep apnea and significant oxygen desaturation to a nadir of 80%.   On her initial study, she had reduced sleep efficiency and moderate snoring.  She has been on CPAP therapy at 13 cm and is 100% compliance with reference to usage stays as well as usage greater than 4 hours.  She is averaging 9 hours and 17 minutes of sleep per night.  She has noticed some dryness.  I have suggested she use nasal saline in her nares, which will also help her sinuses and use this prior to CPAP use at night and also use in the morning.  We discussed alteration of her humidification.  She is meeting compliance standards.  Discussed the role of untreated sleep apnea in cardiovascular disease and its association with mild primary hypertension.  I do not feel to severe pulmonary hypertension is related primarily to her obstructive sleep apnea.  She has Raynaud's disease and does not have definitive crest syndrome.  Her blood pressure today in the office was excellent at 116/74.  She has a ResMed AirFit 10 small mask.  Per Medicare requirements, I will see her in one year for reevaluation.   Medication Adjustments/Labs and Tests Ordered: Current medicines are reviewed at length with the patient today.  Concerns regarding medicines are outlined above.  Medication changes, Labs and Tests ordered today are listed in the Patient Instructions below.  Patient Instructions  Your physician wants you to follow-up in: 1 year or sooner if needed for sleep. You will receive a reminder  letter in the mail two months in advance. If you don't receive a letter, please call our office to schedule the follow-up appointment.    Time spent: 30 minutes Signed, Shelva Majestic, MD  07/20/2016 5:35 PM    [facility] [address] [place], [place], [place]  [address] Phone: [phone]

## 2016-07-29 ENCOUNTER — Ambulatory Visit (INDEPENDENT_AMBULATORY_CARE_PROVIDER_SITE_OTHER): Payer: Medicare Other | Admitting: Internal Medicine

## 2016-07-29 ENCOUNTER — Ambulatory Visit (INDEPENDENT_AMBULATORY_CARE_PROVIDER_SITE_OTHER)
Admission: RE | Admit: 2016-07-29 | Discharge: 2016-07-29 | Disposition: A | Discharge status: Home / Self Care | Payer: Medicare Other | Source: Ambulatory Visit | Attending: Internal Medicine | Admitting: Internal Medicine

## 2016-07-29 ENCOUNTER — Encounter: Payer: Self-pay | Admitting: Internal Medicine

## 2016-07-29 VITALS — BP 112/68 | HR 58 | Temp 97.8°F | Resp 20 | Ht 60.0 in | Wt 151.0 lb

## 2016-07-29 DIAGNOSIS — R05 Cough: Secondary | ICD-10-CM

## 2016-07-29 DIAGNOSIS — R059 Cough, unspecified: Secondary | ICD-10-CM

## 2016-07-29 DIAGNOSIS — I5032 Chronic diastolic (congestive) heart failure: Secondary | ICD-10-CM

## 2016-07-29 MED ORDER — LEVOFLOXACIN 500 MG PO TABS: 500.0000 mg | ORAL_TABLET | Freq: Every day | ORAL | 0 refills | Status: DC

## 2016-07-29 MED ORDER — HYDROCODONE-HOMATROPINE 5-1.5 MG/5ML PO SYRP: 5.0000 mL | ORAL_SOLUTION | Freq: Three times a day (TID) | ORAL | 0 refills | Status: DC | PRN

## 2016-07-29 MED ORDER — PREDNISONE 20 MG PO TABS
40.0000 mg | ORAL_TABLET | Freq: Every day | ORAL | 0 refills | Status: DC
Start: 2016-07-29 — End: 2016-08-05

## 2016-07-29 NOTE — Patient Instructions (Signed)
We have sent in the levaquin for the lungs. Take 1 pill daily. This is the antibiotic.  We have also sent in prednisone which is for the wheezing. Take 2 pills a day for 5 days.   We are checking the chest x-ray today and will call you back with the results.   We have also given you the cough syrup to use to help you sleep.

## 2016-07-29 NOTE — Progress Notes (Signed)
Pre visit review using our clinic review tool, if applicable. No additional management support is needed unless otherwise documented below in the visit note. 

## 2016-07-29 NOTE — Progress Notes (Signed)
   Subjective:    Patient ID: Tiffany [name], female    DOB: 20-Jun-1946, 70 y.o.   MRN: [id number]  HPI The patient is a 70 YO female coming in for cough and cold symptoms. She is now having some improvement in the cough but having a lot of problems with SOB. She does have known pulmonary hypertension but that has been better lately with treatment. No fevers or chills at home. Going on for several weeks now and not improved. Denies weight change or increased swelling. Taking her diuretic as before without missing doses. No diet changes with the holidays. Not taking any otc meds due to concerns about conflict with her medications.   Review of Systems  Constitutional: Positive for activity change, appetite change and chills. Negative for fatigue, fever and unexpected weight change.  HENT: Positive for congestion, postnasal drip and rhinorrhea. Negative for ear discharge, ear pain, sinus pain, sinus pressure, sore throat and trouble swallowing.   Eyes: Negative.   Respiratory: Positive for cough and shortness of breath. Negative for chest tightness and wheezing.   Cardiovascular: Negative for chest pain, palpitations and leg swelling.  Gastrointestinal: Negative.   Musculoskeletal: Negative.       Objective:   Physical Exam  Constitutional: She is oriented to person, place, and time. She appears well-developed and well-nourished. She appears distressed.  SOB which clears with sitting for >5 minutes.   HENT:  Head: Normocephalic and atraumatic.  Right Ear: External ear normal.  Left Ear: External ear normal.  Oropharynx with redness and clear drainage, nose without crusting  Eyes: EOM are normal.  Neck: Normal range of motion.  Cardiovascular: Normal rate and regular rhythm.   Pulmonary/Chest: Effort normal. No respiratory distress. She has wheezes. She has no rales. She exhibits no tenderness.  Wheezing and rhonchi diffusely with do not clear much with cough.   Abdominal: Soft.    Musculoskeletal:  No increase in edema.   Neurological: She is alert and oriented to person, place, and time.  Skin: Skin is warm and dry.   Vitals:   07/29/16 1103  BP: 112/68  Pulse: (!) 58  Resp: 20  Temp: 97.8 F (36.6 C)  TempSrc: Oral  SpO2: 97%  Weight: 151 lb (68.5 kg)  Height: 5' (1.524 m)      Assessment & Plan:

## 2016-07-30 ENCOUNTER — Telehealth: Payer: Self-pay | Admitting: Internal Medicine

## 2016-07-30 NOTE — Telephone Encounter (Signed)
States Dr. Sharlet Salina prescribed her three medications yesterday.  Patient states she took those and could not sleep last night.  Patient states she is not going to take anymore of these medications.  Is requesting call back in regard.

## 2016-07-31 DIAGNOSIS — R059 Cough, unspecified: Secondary | ICD-10-CM | POA: Insufficient documentation

## 2016-07-31 DIAGNOSIS — R05 Cough: Secondary | ICD-10-CM | POA: Insufficient documentation

## 2016-07-31 NOTE — Telephone Encounter (Signed)
Closing this note, additional info is viewable in the labs.

## 2016-07-31 NOTE — Assessment & Plan Note (Addendum)
Checking CXR due to pulmonary hypertension and chf. Suspect respiratory given the rhonchi on exam. Rx for prednisone and levaquin for possible CAP given length of symptoms. She does have notable SOB on exam with exertion but preserved oxygen levels. CXR manually reviewed (per additional problem).

## 2016-07-31 NOTE — Assessment & Plan Note (Signed)
Read of chest x-ray with CHF signs, manual review of x-ray and prior chest x-ray shows not much change from prior in vascular congestion or fluid so decision made to stay with antibiotic treatment and if no response can increase lasix for several days.

## 2016-08-05 ENCOUNTER — Ambulatory Visit (INDEPENDENT_AMBULATORY_CARE_PROVIDER_SITE_OTHER): Payer: Medicare Other | Admitting: Internal Medicine

## 2016-08-05 ENCOUNTER — Encounter: Payer: Self-pay | Admitting: Internal Medicine

## 2016-08-05 DIAGNOSIS — R05 Cough: Secondary | ICD-10-CM | POA: Diagnosis not present

## 2016-08-05 DIAGNOSIS — R059 Cough, unspecified: Secondary | ICD-10-CM

## 2016-08-05 MED ORDER — ALBUTEROL SULFATE HFA 108 (90 BASE) MCG/ACT IN AERS: 2.0000 | INHALATION_SPRAY | Freq: Four times a day (QID) | RESPIRATORY_TRACT | 2 refills | Status: DC | PRN

## 2016-08-05 NOTE — Assessment & Plan Note (Signed)
We discussed the CXR findings of some more CHF but she has no weight gain, no rales on exam, no leg swelling. She declines to increase diuretics today despite recommendation. Taking flonase daily and adding zyrtec daily. Rx for albuterol for SOB. Lungs sound improved s/p levaquin and does not need further antibiotics today. Refuses to take prednisone ever again.

## 2016-08-05 NOTE — Progress Notes (Signed)
Pre visit review using our clinic review tool, if applicable. No additional management support is needed unless otherwise documented below in the visit note. 

## 2016-08-05 NOTE — Patient Instructions (Signed)
We will have you start taking zyrtec (cetirizine) daily to help with the congestion.  We have also sent in an albuterol inhaler that you can use when needed for breathing problems.   Call us back later this week and let us know how you are doing.

## 2016-08-05 NOTE — Progress Notes (Signed)
   Subjective:    Patient ID: Tiffany [name], female    DOB: 03-03-1946, 71 y.o.   MRN: [id number]  HPI The patient is a 71 YO female coming in for cough and congestion. Seen last week and given rx for levaquin and prednisone. She was unable to tolerate the prednisone due to insomnia. She stopped taking it after 1 day. She took the entire course of levaquin. Possibly some reduction in coughing. Still SOB with exertion. Lots of fatigue. Lots of nose congestion and drainage. No sinus pressure or pain. No headaches. No fevers or chills. Cough is still somewhat productive but less often. Using cough syrup to sleep at night time effectively. No wheezing that she was having previously.   Review of Systems  Constitutional: Positive for activity change and fatigue. Negative for appetite change, chills, fever and unexpected weight change.  HENT: Positive for congestion, postnasal drip and rhinorrhea. Negative for ear discharge, ear pain, sinus pain, sinus pressure, sore throat and trouble swallowing.   Eyes: Negative.   Respiratory: Positive for cough and shortness of breath. Negative for chest tightness and wheezing.   Cardiovascular: Negative for chest pain, palpitations and leg swelling.  Gastrointestinal: Negative.   Musculoskeletal: Negative.   Skin: Negative.       Objective:   Physical Exam  Constitutional: She appears well-developed and well-nourished.  HENT:  Head: Normocephalic and atraumatic.  Right Ear: External ear normal.  Left Ear: External ear normal.  Oropharynx with redness, and nose with drainage clear, no sinus pressure or pain.  Eyes: EOM are normal.  Neck: Normal range of motion. No JVD present.  Cardiovascular: Normal rate and regular rhythm.   Pulmonary/Chest: Effort normal. No respiratory distress. She has no wheezes. She has no rales.  The prior rhonchi and wheezing is gone on exam today, good air flow  Abdominal: Soft.  Lymphadenopathy:    She has no cervical  adenopathy.  Skin: Skin is warm and dry.   Vitals:   08/05/16 1037  BP: 110/72  Pulse: 86  Resp: 14  Temp: 98 F (36.7 C)  TempSrc: Oral  Weight: 151 lb 2 oz (68.5 kg)  Height: 5' (1.524 m)      Assessment & Plan:

## 2016-08-07 ENCOUNTER — Encounter: Payer: Self-pay | Admitting: Internal Medicine

## 2016-08-14 ENCOUNTER — Telehealth (HOSPITAL_COMMUNITY): Payer: Self-pay | Admitting: Pharmacist

## 2016-08-14 NOTE — Telephone Encounter (Signed)
Adcirca PA approved by [employer] through 08/02/17.   Ruta Hinds. Velva Harman, PharmD, BCPS, CPP Clinical Pharmacist Pager: [phone] Phone: [phone] 08/14/2016 3:54 PM

## 2016-08-31 ENCOUNTER — Telehealth (HOSPITAL_COMMUNITY): Payer: Self-pay | Admitting: Pharmacist

## 2016-08-31 NOTE — Telephone Encounter (Signed)
Uptravi 600 mcg BID PA approved by [employer] through 08/02/17.   Ruta Hinds. Velva Harman, PharmD, BCPS, CPP Clinical Pharmacist Pager: [phone] Phone: [phone] 08/31/2016 11:08 AM

## 2016-09-04 ENCOUNTER — Encounter (HOSPITAL_COMMUNITY): Payer: Self-pay

## 2016-09-04 ENCOUNTER — Ambulatory Visit (HOSPITAL_COMMUNITY)
Admission: RE | Admit: 2016-09-04 | Discharge: 2016-09-04 | Disposition: A | Discharge status: Home / Self Care | Payer: Medicare Other | Source: Ambulatory Visit | Attending: Cardiology | Admitting: Cardiology

## 2016-09-04 VITALS — BP 119/73 | HR 86 | Wt 151.2 lb

## 2016-09-04 DIAGNOSIS — E039 Hypothyroidism, unspecified: Secondary | ICD-10-CM | POA: Diagnosis not present

## 2016-09-04 DIAGNOSIS — N183 Chronic kidney disease, stage 3 unspecified: Secondary | ICD-10-CM

## 2016-09-04 DIAGNOSIS — Z7901 Long term (current) use of anticoagulants: Secondary | ICD-10-CM | POA: Diagnosis not present

## 2016-09-04 DIAGNOSIS — I5032 Chronic diastolic (congestive) heart failure: Secondary | ICD-10-CM | POA: Insufficient documentation

## 2016-09-04 DIAGNOSIS — I130 Hypertensive heart and chronic kidney disease with heart failure and stage 1 through stage 4 chronic kidney disease, or unspecified chronic kidney disease: Secondary | ICD-10-CM | POA: Diagnosis not present

## 2016-09-04 DIAGNOSIS — G4730 Sleep apnea, unspecified: Secondary | ICD-10-CM | POA: Diagnosis not present

## 2016-09-04 DIAGNOSIS — I7300 Raynaud's syndrome without gangrene: Secondary | ICD-10-CM | POA: Insufficient documentation

## 2016-09-04 DIAGNOSIS — I272 Pulmonary hypertension, unspecified: Secondary | ICD-10-CM | POA: Diagnosis not present

## 2016-09-04 DIAGNOSIS — I480 Paroxysmal atrial fibrillation: Secondary | ICD-10-CM | POA: Diagnosis not present

## 2016-09-04 LAB — BASIC METABOLIC PANEL
Anion gap: 10 (ref 5–15)
BUN: 26 mg/dL — ABNORMAL HIGH (ref 6–20)
CO2: 24 mmol/L (ref 22–32)
Calcium: 9.5 mg/dL (ref 8.9–10.3)
Chloride: 104 mmol/L (ref 101–111)
Creatinine, Ser: 1.71 mg/dL — ABNORMAL HIGH (ref 0.44–1.00)
GFR calc Af Amer: 33 mL/min — ABNORMAL LOW (ref >60)
GFR calc non Af Amer: 29 mL/min — ABNORMAL LOW (ref >60)
Glucose, Bld: 98 mg/dL (ref 65–99)
Potassium: 3.5 mmol/L (ref 3.5–5.1)
Sodium: 138 mmol/L (ref 135–145)

## 2016-09-04 LAB — BRAIN NATRIURETIC PEPTIDE: B Natriuretic Peptide: 157.5 pg/mL — ABNORMAL HIGH (ref 0.0–100.0)

## 2016-09-04 MED ORDER — TADALAFIL (PAH) 20 MG PO TABS: 40.0000 mg | ORAL_TABLET | Freq: Every day | ORAL | 6 refills | Status: DC

## 2016-09-04 NOTE — Patient Instructions (Signed)
Labs today  Prescription for Tiffany [name] has been sent to your local [facility]  Your physician recommends that you schedule a follow-up appointment in: 3 months

## 2016-09-06 NOTE — Progress Notes (Signed)
Patient ID: Tiffany [name], female   DOB: 14-Oct-1945, 71 y.o.   MRN: [id number] PCP: Dr. Sharlet Salina HF Cardiology: Dr. [name]  71 yo with history of paroxysmal atrial fibrillation and diastolic CHF was recently noted to have significant pulmonary hypertension.  She has had dyspnea for about a year now.  It has steadily worsened.  She has history of paroxysmal atrial fibrillation and had an episode in 6/16 requiring cardioversion.  She was started on amiodarone but this was stopped with worsening breathing.  Cardiolite in 9/16 showed on ischemia or infarction.     I had her do a RHC in 1/17.  This showed elevated left and right heart filling pressures but also PAH. After cath, she increased her Lasix from 40 qod to 40 daily.  At a prior appointment, I increased Lasix to 40 mg bid and also started her on Opsumit.  At next appointment, I added Adcirca 20 and then titrated it up to 40 mg daily. She continue to be volume overloaded, so  I increased Lasix to 80 qam/40 qpm. She has seemed to do well on this dose.   Echo was done in 6/17, RV mildly dilated with moderately decreased systolic function and PASP 85 mmHg.    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   She continues on her pulmonary hypertension meds, tolerating current doses.  Since recently, she has had a couple of severe URIs and has had courses of antibiotics.  In the setting of the URIs, she has noted increased frequently of atrial fibrillation.  Runs will usually last 3-4 hours, she is quite symptomatic with palpitations and fatigue.  No prolonged episodes beyond a few hours.  She is in NSR today.  Breathing is stable, no dyspnea walking on flat ground.  Minimal dyspnea walking up a hill.  No chest pain.  No lightheadedness.  No melena or BRBPR.  Weight is down 6 lbs.   6 minute walk (2/17): 141 m 6 minute walk (3/17): 182 m 6 minute walk (5/17): 229 m 6 minute walk (10/17): 305 m  Labs (12/16): BNP 1187, ANA  1:640, TSH elevated. Labs (2/17): K 5.4 => 3.9, creatinine 1.78 => 1.66, LDL 112, BNP 880 Labs (4/17): K 4.5, creatinine 1.76 Labs (5/17): K 4.1, creatinine 1.79 Labs (8/17): K 3.1, creatinine 1.7, BNP 404 Labs (9/17): K 3.9, creatinine 1.8, BNP 512  PMH:  1. CKD stage III 2. Bradycardia in setting of metoprolol + amiodarone use.  3. Raynauds phenomenon. 4. BPPV 5. OA right hip 6. Hypothyroidism 7. Atrial fibrillation: Paroxysmal.  She was on amiodarone in the past but this was stopped when she became more short of breath.  TEE-guided DCCV in 6/16.  8. HTN 9. 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 - TEE (6/16) with EF 45-50%, mildly dilated RV with mildly decreased systolic function, PASP 36 mmHg.  - RHC (1/17) with mean RA 9, PA 80/29 mean 52, mean PCWP 27, CI 1.97 Fick/1.8 thermo, PVR 7.6 Fick/8.4 thermo.  - Echo (6/17): EF 60-65%, mild MR, mild RV dilation with moderately decreased systolic function, moderate TR, PASP 85 mmHg.  10. Pulmonary hypertension: See RHC above.  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  - TEE (6/16) with mildly dilated RV with mildly decreased systolic function. - ANA 5:366, anti-centromere antibody elevated, anti-SCL-70 antibody negative,  h/o Raynauds - PFTs (7/16) with minimal obstructive defect but moderately decreased DLCO.  - V/Q scan (2/17) negative for acute or chronic PE.  11. Cardiolite (9/16) with no ischemia/infarction.  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  13. Subclinical hypothyroidism.  14. OSA: Severe on 9/17 sleep study. Now using CPAP.   SH: Married, lives in [place], no smoking, no ETOH.   FH: Adopted.  Daughter has Raynaud's.  ROS: All systems reviewed and negative except as per HPI  Current Outpatient Prescriptions  Medication Sig Dispense Refill  . acetaminophen (TYLENOL) 325 MG  tablet Take 2 tablets (650 mg total) by mouth every 4 (four) hours as needed for headache or mild pain.    Marland Kitchen albuterol (PROVENTIL HFA;VENTOLIN HFA) 108 (90 Base) MCG/ACT inhaler Inhale 2 puffs into the lungs every 6 (six) hours as needed for wheezing or shortness of breath. 1 Inhaler 2  . apixaban (ELIQUIS) 5 MG TABS tablet Take 1 tablet (5 mg total) by mouth 2 (two) times daily. 180 tablet 1  . Calcium Carb-Cholecalciferol (CALCIUM 600 + D PO) Take 1 tablet by mouth daily.    Marland Kitchen conjugated estrogens (PREMARIN) vaginal cream Place 1 Applicatorful vaginally daily.    . diazepam (VALIUM) 5 MG tablet TAKE 1 TABLET BY MOUTH EVERY 8 HOURS AS NEEDED FOR ANXIETY 30 tablet 2  . famotidine (PEPCID) 20 MG tablet Take 20 mg by mouth 2 (two) times daily.    . fluticasone (FLONASE) 50 MCG/ACT nasal spray Place 1 spray into both nostrils daily as needed for allergies or rhinitis. 16 g 2  . furosemide (LASIX) 40 MG tablet Take 120 mg by mouth.    . Macitentan (OPSUMIT) 10 MG TABS Take 1 tablet (10 mg total) by mouth daily. 30 tablet 3  . Multiple Vitamins-Minerals (CENTRUM SILVER) tablet Take 1 tablet by mouth daily.     . potassium chloride SA (KLOR-CON M20) 20 MEQ tablet Take 1 tablet (20 mEq total) by mouth daily. 90 tablet 3  . Selexipag (UPTRAVI) 600 MCG TABS Take 600 mcg by mouth 2 (two) times daily. 60 tablet 11  . tadalafil, PAH, (ADCIRCA) 20 MG tablet Take 2 tablets (40 mg total) by mouth daily. 60 tablet 6   No current facility-administered medications for this encounter.    BP 119/73   Pulse 86   Wt 151 lb 4 oz (68.6 kg)   SpO2 97%   BMI 29.54 kg/m  General: NAD Neck: JVP 7 cm, no thyromegaly or thyroid nodule.  Lungs: Clear to auscultation bilaterally with normal respiratory effort. CV: Nondisplaced PMI.  Heart regular S1/S2, no S3/S4, 1/6 HSM LLSB.  No edema, bilateral venous varicosities lower legs/feet.  No carotid bruit.  Normal pedal pulses.  Abdomen: Soft, nontender, no  hepatosplenomegaly, no distention.  Skin: Intact without lesions or rashes.  Neurologic: Alert and oriented x 3.  Psych: Normal affect. Extremities: No clubbing or cyanosis.  HEENT: Normal.   Assessment/Plan: 1. Pulmonary hypertension: Patient has pulmonary arterial hypertension.  She appears to have co-existing diastolic LV dysfunction given elevated PCWP on RHC.  PFTs did not show significant obstruction, they only showed moderately decreased DLCO consistent with pulmonary vascular disease. V/Q scan did not show evidence for chronic PE.  PVR 7.6 WU by Fick and 8.4 WU by thermodiluation.  Cardiac index was low, 1.97 Fick/1.8 thermo.  Most recent echo in 6/17 still showed elevated PA pressure with a dysfunctional RV. 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  ANA 1:640 with  elevated anti-centromere antibody and Raynaud's phenomenon, suspect PAH related to rheumatological disease, incomplete CREST syndrome per Dr Elmon Else last note.  6 minute walk is improved since selexipag was started.  - She has sleep apnea, now using CPAP.  - Continue Opsumit and Adcirca 40 daily.  - Unable to increase Selexipag beyond 600 mcg bid due to side effects.  - 6 minute walk next appointment, does not want to do it today due to URI.  2. Chronic diastolic CHF: Elevated PCWP on 1/17 RHC.  Last echo in 6/17 with EF 60-65%.  NYHA class II, volume looks ok on exam.  - Continue Lasix 80 qam/40 qpm.     3. Atrial fibrillation: Paroxysmal.  She is in NSR today.  Having more symptomatic breakthrough episodes.  She relates these to her recent URIs.  If episodes continue to be frequent, we really have 2 options (as she did not tolerate amiodarone): Tikosyn versus ablation.  Tikosyn would likely not be ideal due to low GFR, she would only be able to take 125 mcg bid.   - Continue Eliquis 5 mg bid.  CBC today.   4. CKD: Stage III. BMET today.   Loralie Champagne 09/06/2016

## 2016-09-10 ENCOUNTER — Other Ambulatory Visit (HOSPITAL_COMMUNITY): Payer: Self-pay | Admitting: Pharmacist

## 2016-09-10 MED ORDER — MACITENTAN 10 MG PO TABS: 10.0000 mg | ORAL_TABLET | Freq: Every day | ORAL | 11 refills | Status: DC

## 2016-09-18 ENCOUNTER — Other Ambulatory Visit (HOSPITAL_COMMUNITY): Payer: Self-pay | Admitting: Cardiology

## 2016-09-18 MED ORDER — TADALAFIL (PAH) 20 MG PO TABS: 40.0000 mg | ORAL_TABLET | Freq: Every day | ORAL | 2 refills | Status: DC

## 2016-10-07 ENCOUNTER — Encounter: Payer: Self-pay | Admitting: Internal Medicine

## 2016-10-07 ENCOUNTER — Ambulatory Visit (INDEPENDENT_AMBULATORY_CARE_PROVIDER_SITE_OTHER): Payer: Medicare Other | Admitting: Internal Medicine

## 2016-10-07 VITALS — BP 124/70 | HR 97 | Temp 97.9°F | Ht 60.0 in | Wt 153.0 lb

## 2016-10-07 DIAGNOSIS — E038 Other specified hypothyroidism: Secondary | ICD-10-CM

## 2016-10-07 DIAGNOSIS — E785 Hyperlipidemia, unspecified: Secondary | ICD-10-CM | POA: Diagnosis not present

## 2016-10-07 DIAGNOSIS — Z0000 Encounter for general adult medical examination without abnormal findings: Secondary | ICD-10-CM | POA: Diagnosis not present

## 2016-10-07 DIAGNOSIS — F419 Anxiety disorder, unspecified: Secondary | ICD-10-CM

## 2016-10-07 DIAGNOSIS — N184 Chronic kidney disease, stage 4 (severe): Secondary | ICD-10-CM

## 2016-10-07 MED ORDER — DIAZEPAM 5 MG PO TABS: 5.0000 mg | ORAL_TABLET | Freq: Three times a day (TID) | ORAL | 2 refills | Status: DC | PRN

## 2016-10-07 NOTE — Assessment & Plan Note (Signed)
Refill her diazepam today for ongoing concerns about her health. She is doing better overall since diagnosis and treatment of her pulmonary hypertension (that this was not just in her head).

## 2016-10-07 NOTE — Assessment & Plan Note (Signed)
Unable to tolerate statins. Last LDL close to goal at 112. She wishes to wait until next blood draw to check cholesterol as a lot of labs and procedures lately.

## 2016-10-07 NOTE — Progress Notes (Signed)
Pre visit review using our clinic review tool, if applicable. No additional management support is needed unless otherwise documented below in the visit note. 

## 2016-10-07 NOTE — Patient Instructions (Signed)
We will check the labs next time.   Health Maintenance, Female Adopting a healthy lifestyle and getting preventive care can go a long way to promote health and wellness. Talk with your health care provider about what schedule of regular examinations is right for you. This is a good chance for you to check in with your provider about disease prevention and staying healthy. In between checkups, there are plenty of things you can do on your own. Experts have done a lot of research about which lifestyle changes and preventive measures are most likely to keep you healthy. Ask your health care provider for more information. Weight and diet Eat a healthy diet  Be sure to include plenty of vegetables, fruits, low-fat dairy products, and lean protein.  Do not eat a lot of foods high in solid fats, added sugars, or salt.  Get regular exercise. This is one of the most important things you can do for your health.  Most adults should exercise for at least 150 minutes each week. The exercise should increase your heart rate and make you sweat (moderate-intensity exercise).  Most adults should also do strengthening exercises at least twice a week. This is in addition to the moderate-intensity exercise. Maintain a healthy weight  Body mass index (BMI) is a measurement that can be used to identify possible weight problems. It estimates body fat based on height and weight. Your health care provider can help determine your BMI and help you achieve or maintain a healthy weight.  For females 25 years of age and older:  A BMI below 18.5 is considered underweight.  A BMI of 18.5 to 24.9 is normal.  A BMI of 25 to 29.9 is considered overweight.  A BMI of 30 and above is considered obese. Watch levels of cholesterol and blood lipids  You should start having your blood tested for lipids and cholesterol at 74 years of age, then have this test every 5 years.  You may need to have your cholesterol levels checked  more often if:  Your lipid or cholesterol levels are high.  You are older than 73 years of age.  You are at high risk for heart disease. Cancer screening Lung Cancer  Lung cancer screening is recommended for adults 26-27 years old who are at high risk for lung cancer because of a history of smoking.  A yearly low-dose CT scan of the lungs is recommended for people who:  Currently smoke.  Have quit within the past 15 years.  Have at least a 30-pack-year history of smoking. A pack year is smoking an average of one pack of cigarettes a day for 1 year.  Yearly screening should continue until it has been 15 years since you quit.  Yearly screening should stop if you develop a health problem that would prevent you from having lung cancer treatment. Breast Cancer  Practice breast self-awareness. This means understanding how your breasts normally appear and feel.  It also means doing regular breast self-exams. Let your health care provider know about any changes, no matter how small.  If you are in your 20s or 30s, you should have a clinical breast exam (CBE) by a health care provider every 1-3 years as part of a regular health exam.  If you are 71 or older, have a CBE every year. Also consider having a breast X-ray (mammogram) every year.  If you have a family history of breast cancer, talk to your health care provider about genetic screening.  If  you are at high risk for breast cancer, talk to your health care provider about having an MRI and a mammogram every year.  Breast cancer gene (BRCA) assessment is recommended for women who have family members with BRCA-related cancers. BRCA-related cancers include:  Breast.  Ovarian.  Tubal.  Peritoneal cancers.  Results of the assessment will determine the need for genetic counseling and BRCA1 and BRCA2 testing. Cervical Cancer  Your health care provider may recommend that you be screened regularly for cancer of the pelvic organs  (ovaries, uterus, and vagina). This screening involves a pelvic examination, including checking for microscopic changes to the surface of your cervix (Pap test). You may be encouraged to have this screening done every 3 years, beginning at age 96.  For women ages 101-65, health care providers may recommend pelvic exams and Pap testing every 3 years, or they may recommend the Pap and pelvic exam, combined with testing for human papilloma virus (HPV), every 5 years. Some types of HPV increase your risk of cervical cancer. Testing for HPV may also be done on women of any age with unclear Pap test results.  Other health care providers may not recommend any screening for nonpregnant women who are considered low risk for pelvic cancer and who do not have symptoms. Ask your health care provider if a screening pelvic exam is right for you.  If you have had past treatment for cervical cancer or a condition that could lead to cancer, you need Pap tests and screening for cancer for at least 20 years after your treatment. If Pap tests have been discontinued, your risk factors (such as having a new sexual partner) need to be reassessed to determine if screening should resume. Some women have medical problems that increase the chance of getting cervical cancer. In these cases, your health care provider may recommend more frequent screening and Pap tests. Colorectal Cancer  This type of cancer can be detected and often prevented.  Routine colorectal cancer screening usually begins at 73 years of age and continues through 73 years of age.  Your health care provider may recommend screening at an earlier age if you have risk factors for colon cancer.  Your health care provider may also recommend using home test kits to check for hidden blood in the stool.  A small camera at the end of a tube can be used to examine your colon directly (sigmoidoscopy or colonoscopy). This is done to check for the earliest forms of  colorectal cancer.  Routine screening usually begins at age 41.  Direct examination of the colon should be repeated every 5-10 years through 73 years of age. However, you may need to be screened more often if early forms of precancerous polyps or small growths are found. Skin Cancer  Check your skin from head to toe regularly.  Tell your health care provider about any new moles or changes in moles, especially if there is a change in a mole's shape or color.  Also tell your health care provider if you have a mole that is larger than the size of a pencil eraser.  Always use sunscreen. Apply sunscreen liberally and repeatedly throughout the day.  Protect yourself by wearing long sleeves, pants, a wide-brimmed hat, and sunglasses whenever you are outside. Heart disease, diabetes, and high blood pressure  High blood pressure causes heart disease and increases the risk of stroke. High blood pressure is more likely to develop in:  People who have blood pressure in the high  end of the normal range (130-139/85-89 mm Hg).  People who are overweight or obese.  People who are African American.  If you are 18-39 years of age, have your blood pressure checked every 3-5 years. If you are 40 years of age or older, have your blood pressure checked every year. You should have your blood pressure measured twice-once when you are at a hospital or clinic, and once when you are not at a hospital or clinic. Record the average of the two measurements. To check your blood pressure when you are not at a hospital or clinic, you can use:  An automated blood pressure machine at a pharmacy.  A home blood pressure monitor.  If you are between 55 years and 79 years old, ask your health care provider if you should take aspirin to prevent strokes.  Have regular diabetes screenings. This involves taking a blood sample to check your fasting blood sugar level.  If you are at a normal weight and have a low risk for  diabetes, have this test once every three years after 73 years of age.  If you are overweight and have a high risk for diabetes, consider being tested at a younger age or more often. Preventing infection Hepatitis B  If you have a higher risk for hepatitis B, you should be screened for this virus. You are considered at high risk for hepatitis B if:  You were born in a country where hepatitis B is common. Ask your health care provider which countries are considered high risk.  Your parents were born in a high-risk country, and you have not been immunized against hepatitis B (hepatitis B vaccine).  You have HIV or AIDS.  You use needles to inject street drugs.  You live with someone who has hepatitis B.  You have had sex with someone who has hepatitis B.  You get hemodialysis treatment.  You take certain medicines for conditions, including cancer, organ transplantation, and autoimmune conditions. Hepatitis C  Blood testing is recommended for:  Everyone born from 1945 through 1965.  Anyone with known risk factors for hepatitis C. Sexually transmitted infections (STIs)  You should be screened for sexually transmitted infections (STIs) including gonorrhea and chlamydia if:  You are sexually active and are younger than 73 years of age.  You are older than 73 years of age and your health care provider tells you that you are at risk for this type of infection.  Your sexual activity has changed since you were last screened and you are at an increased risk for chlamydia or gonorrhea. Ask your health care provider if you are at risk.  If you do not have HIV, but are at risk, it may be recommended that you take a prescription medicine daily to prevent HIV infection. This is called pre-exposure prophylaxis (PrEP). You are considered at risk if:  You are sexually active and do not regularly use condoms or know the HIV status of your partner(s).  You take drugs by injection.  You are  sexually active with a partner who has HIV. Talk with your health care provider about whether you are at high risk of being infected with HIV. If you choose to begin PrEP, you should first be tested for HIV. You should then be tested every 3 months for as long as you are taking PrEP. Pregnancy  If you are premenopausal and you may become pregnant, ask your health care provider about preconception counseling.  If you may become pregnant,   take 400 to 800 micrograms (mcg) of folic acid every day.  If you want to prevent pregnancy, talk to your health care provider about birth control (contraception). Osteoporosis and menopause  Osteoporosis is a disease in which the bones lose minerals and strength with aging. This can result in serious bone fractures. Your risk for osteoporosis can be identified using a bone density scan.  If you are 65 years of age or older, or if you are at risk for osteoporosis and fractures, ask your health care provider if you should be screened.  Ask your health care provider whether you should take a calcium or vitamin D supplement to lower your risk for osteoporosis.  Menopause may have certain physical symptoms and risks.  Hormone replacement therapy may reduce some of these symptoms and risks. Talk to your health care provider about whether hormone replacement therapy is right for you. Follow these instructions at home:  Schedule regular health, dental, and eye exams.  Stay current with your immunizations.  Do not use any tobacco products including cigarettes, chewing tobacco, or electronic cigarettes.  If you are pregnant, do not drink alcohol.  If you are breastfeeding, limit how much and how often you drink alcohol.  Limit alcohol intake to no more than 1 drink per day for nonpregnant women. One drink equals 12 ounces of beer, 5 ounces of wine, or 1 ounces of hard liquor.  Do not use street drugs.  Do not share needles.  Ask your health care  provider for help if you need support or information about quitting drugs.  Tell your health care provider if you often feel depressed.  Tell your health care provider if you have ever been abused or do not feel safe at home. This information is not intended to replace advice given to you by your health care provider. Make sure you discuss any questions you have with your health care provider. Document Released: 02/02/2011 Document Revised: 12/26/2015 Document Reviewed: 04/23/2015 Elsevier Interactive Patient Education  2017 [employer]  

## 2016-10-07 NOTE — Assessment & Plan Note (Signed)
Last TSH and free T4 acceptable. She did have reaction with synthroid and wants to avoid meds. Will check with next blood draw.

## 2016-10-07 NOTE — Assessment & Plan Note (Signed)
Will do mammogram yearly, gets flu shot yearly. Declines additional pneumonia shot today. Tetanus up to date. Colonoscopy due in 2025, dexa up to date. Counseled on sun safety and mole surveillance done at visit. Counseled on home safety. Given 10 year screening recommendations.

## 2016-10-07 NOTE — Assessment & Plan Note (Signed)
Most recent GFR stable in the high 20s. She has well controlled BP and no diabetes.

## 2016-10-07 NOTE — Progress Notes (Signed)
   Subjective:    Patient ID: Tiffany [name], female    DOB: Jan 29, 1946, 71 y.o.   MRN: [id number]  HPI Here for medicare wellness and physical, no new complaints. Several questions which are addressed at the visit. Please see A/P for status and treatment of chronic medical problems.   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 The patient's medical and social history - reviewed today no changes 2. Their use of alcohol, tobacco or illicit drugs 3. Their current medications and supplements 4. The patient's functional ability including ADL's, fall risks, home safety risks and hearing or visual impairment. 5. Diet and physical activities 6. Evidence for depression or mood disorders 7. Care team reviewed and updated (available in snapshot)  Review of Systems  Constitutional: Positive for activity change. Negative for appetite change, chills, fatigue, fever and unexpected weight change.       Still recovering from sinus infection  HENT: Negative.   Eyes: Negative.   Respiratory: Positive for shortness of breath. Negative for cough and chest tightness.   Cardiovascular: Negative for chest pain, palpitations and leg swelling.  Gastrointestinal: Negative for abdominal distention, abdominal pain, constipation, diarrhea, nausea and vomiting.  Musculoskeletal: Negative.   Skin: Negative.   Neurological: Positive for weakness. Negative for dizziness, seizures, syncope, facial asymmetry, speech difficulty, light-headedness and headaches.  Psychiatric/Behavioral: Negative.       Objective:   Physical Exam   Constitutional: She is oriented to person, place, and time. She appears well-developed and well-nourished.  HENT:  Head: Normocephalic and atraumatic.  Right Ear: External ear normal.  Left Ear: External ear normal.  Mouth/Throat: Oropharynx is clear and moist.  Eyes: EOM are normal.  Neck: Normal range of motion.  Cardiovascular: Normal rate and regular rhythm.   In sinus today  Pulmonary/Chest: Effort normal and breath sounds normal. No respiratory distress. She has no wheezes. She has no rales.  Abdominal: Soft. Bowel sounds are normal. She exhibits no distension. There is no tenderness. There is no rebound.  Musculoskeletal: She exhibits no edema.  Neurological: She is alert and oriented to person, place, and time. Coordination normal.  Skin: Skin is warm and dry.  Psychiatric: She has a normal mood and affect.   Vitals:   10/07/16 1029  BP: 124/70  Pulse: 97  Temp: 97.9 F (36.6 C)  TempSrc: Oral  Weight: 153 lb (69.4 kg)  Height: 5' (1.524 m)      Assessment & Plan:

## 2016-10-19 LAB — HM MAMMOGRAPHY

## 2016-10-21 ENCOUNTER — Encounter: Payer: Self-pay | Admitting: Internal Medicine

## 2016-10-21 NOTE — Progress Notes (Unsigned)
Results entered and sent to scan  

## 2016-12-01 ENCOUNTER — Other Ambulatory Visit: Payer: Self-pay | Admitting: Cardiology

## 2016-12-01 NOTE — Telephone Encounter (Signed)
REFILL 

## 2016-12-02 ENCOUNTER — Other Ambulatory Visit: Payer: Self-pay | Admitting: Internal Medicine

## 2016-12-03 NOTE — Telephone Encounter (Signed)
Rx sent to pharmacy by CMA on 12/01/16

## 2016-12-04 ENCOUNTER — Encounter (HOSPITAL_COMMUNITY): Payer: Medicare Other

## 2016-12-09 ENCOUNTER — Ambulatory Visit (HOSPITAL_COMMUNITY)
Admission: RE | Admit: 2016-12-09 | Discharge: 2016-12-09 | Disposition: A | Discharge status: Home / Self Care | Payer: Medicare Other | Source: Ambulatory Visit | Attending: Cardiology | Admitting: Cardiology

## 2016-12-09 ENCOUNTER — Encounter (HOSPITAL_COMMUNITY): Payer: Self-pay

## 2016-12-09 VITALS — BP 128/69 | HR 83 | Ht 60.0 in | Wt 153.8 lb

## 2016-12-09 DIAGNOSIS — N183 Chronic kidney disease, stage 3 unspecified: Secondary | ICD-10-CM

## 2016-12-09 DIAGNOSIS — I272 Pulmonary hypertension, unspecified: Secondary | ICD-10-CM | POA: Diagnosis not present

## 2016-12-09 DIAGNOSIS — I480 Paroxysmal atrial fibrillation: Secondary | ICD-10-CM | POA: Insufficient documentation

## 2016-12-09 DIAGNOSIS — E039 Hypothyroidism, unspecified: Secondary | ICD-10-CM | POA: Insufficient documentation

## 2016-12-09 DIAGNOSIS — I2721 Secondary pulmonary arterial hypertension: Secondary | ICD-10-CM | POA: Insufficient documentation

## 2016-12-09 DIAGNOSIS — I4891 Unspecified atrial fibrillation: Secondary | ICD-10-CM | POA: Diagnosis not present

## 2016-12-09 DIAGNOSIS — I130 Hypertensive heart and chronic kidney disease with heart failure and stage 1 through stage 4 chronic kidney disease, or unspecified chronic kidney disease: Secondary | ICD-10-CM | POA: Insufficient documentation

## 2016-12-09 DIAGNOSIS — E784 Other hyperlipidemia: Secondary | ICD-10-CM | POA: Diagnosis not present

## 2016-12-09 DIAGNOSIS — I5032 Chronic diastolic (congestive) heart failure: Secondary | ICD-10-CM | POA: Diagnosis present

## 2016-12-09 DIAGNOSIS — E7849 Other hyperlipidemia: Secondary | ICD-10-CM

## 2016-12-09 LAB — CBC
HCT: 40.3 % (ref 36.0–46.0)
Hemoglobin: 13.1 g/dL (ref 12.0–15.0)
MCH: 29.8 pg (ref 26.0–34.0)
MCHC: 32.5 g/dL (ref 30.0–36.0)
MCV: 91.8 fL (ref 78.0–100.0)
Platelets: 165 10*3/uL (ref 150–400)
RBC: 4.39 MIL/uL (ref 3.87–5.11)
RDW: 15.7 % — ABNORMAL HIGH (ref 11.5–15.5)
WBC: 4.5 10*3/uL (ref 4.0–10.5)

## 2016-12-09 LAB — COMPREHENSIVE METABOLIC PANEL
ALT: 12 U/L — ABNORMAL LOW (ref 14–54)
AST: 20 U/L (ref 15–41)
Albumin: 3.9 g/dL (ref 3.5–5.0)
Alkaline Phosphatase: 86 U/L (ref 38–126)
Anion gap: 10 (ref 5–15)
BUN: 26 mg/dL — ABNORMAL HIGH (ref 6–20)
CO2: 26 mmol/L (ref 22–32)
Calcium: 9.6 mg/dL (ref 8.9–10.3)
Chloride: 103 mmol/L (ref 101–111)
Creatinine, Ser: 1.8 mg/dL — ABNORMAL HIGH (ref 0.44–1.00)
GFR calc Af Amer: 31 mL/min — ABNORMAL LOW (ref >60)
GFR calc non Af Amer: 27 mL/min — ABNORMAL LOW (ref >60)
Glucose, Bld: 96 mg/dL (ref 65–99)
Potassium: 4.1 mmol/L (ref 3.5–5.1)
Sodium: 139 mmol/L (ref 135–145)
Total Bilirubin: 0.8 mg/dL (ref 0.3–1.2)
Total Protein: 7.1 g/dL (ref 6.5–8.1)

## 2016-12-09 LAB — LIPID PANEL
Cholesterol: 223 mg/dL — ABNORMAL HIGH (ref 0–200)
HDL: 62 mg/dL (ref >40)
LDL Cholesterol: 120 mg/dL — ABNORMAL HIGH (ref 0–99)
Total CHOL/HDL Ratio: 3.6 RATIO
Triglycerides: 204 mg/dL — ABNORMAL HIGH (ref <150)
VLDL: 41 mg/dL — ABNORMAL HIGH (ref 0–40)

## 2016-12-09 LAB — BRAIN NATRIURETIC PEPTIDE: B Natriuretic Peptide: 201.2 pg/mL — ABNORMAL HIGH (ref 0.0–100.0)

## 2016-12-09 NOTE — Progress Notes (Signed)
Patient ID: Tiffany [name], female   DOB: 07-Aug-1945, 71 y.o.   MRN: [id number] PCP: Dr. Sharlet Salina HF Cardiology: Dr. [name]  71 yo with history of paroxysmal atrial fibrillation and diastolic CHF was recently noted to have significant pulmonary hypertension.  She has had dyspnea for about a year now.  It has steadily worsened.  She has history of paroxysmal atrial fibrillation and had an episode in 6/16 requiring cardioversion.  She was started on amiodarone but this was stopped with worsening breathing.  Cardiolite in 9/16 showed on ischemia or infarction.     I had her do a RHC in 1/17.  This showed elevated left and right heart filling pressures but also PAH. After cath, she increased her Lasix from 40 qod to 40 daily.  At a prior appointment, I increased Lasix to 40 mg bid and also started her on Opsumit.  At next appointment, I added Adcirca 20 and then titrated it up to 40 mg daily. She continue to be volume overloaded, so  I increased Lasix to 80 qam/40 qpm. She has seemed to do well on this dose.   Echo was done in 6/17, RV mildly dilated with moderately decreased systolic function and PASP 85 mmHg.    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   She continues on her pulmonary hypertension meds, tolerating current doses. She is in NSR today but has been having more frequent episodes of atrial fibrillation.  When she is in atrial fibrillation, she feels more fatigued and feels "bizarre."  For the first time, a couple of episodes lasted overnight.  Breathing is stable, no dyspnea walking on flat ground.  Some dyspnea walking up a hill or incline or when carrying a load.  She can do housework like making up the bed without dyspnea.  No chest pain.  No lightheadedness.  No melena or BRBPR.  Weight is down 4 lbs.   ECG (personally reviewed): NSR, septal Qs, inferior and anterolateral TWIs.   6 minute walk (2/17): 141 m 6 minute walk (3/17): 182 m 6 minute walk (5/17):  229 m 6 minute walk (10/17): 305 m 6 minute walk (5/18): 256 m  Labs (12/16): BNP 1187, ANA 1:640, TSH elevated. Labs (2/17): K 5.4 => 3.9, creatinine 1.78 => 1.66, LDL 112, BNP 880 Labs (4/17): K 4.5, creatinine 1.76 Labs (5/17): K 4.1, creatinine 1.79 Labs (8/17): K 3.1, creatinine 1.7, BNP 404 Labs (9/17): K 3.9, creatinine 1.8, BNP 512 Labs (2/18): K 3.5, creatinine 1.71, BNP 157  PMH:  1. CKD stage III 2. Bradycardia in setting of metoprolol + amiodarone use.  3. Raynauds phenomenon. 4. BPPV 5. OA right hip 6. Hypothyroidism 7. Atrial fibrillation: Paroxysmal.  She was on amiodarone in the past but this was stopped when she became more short of breath.  TEE-guided DCCV in 6/16.  8. HTN 9. 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 - TEE (6/16) with EF 45-50%, mildly dilated RV with mildly decreased systolic function, PASP 36 mmHg.  - RHC (1/17) with mean RA 9, PA 80/29 mean 52, mean PCWP 27, CI 1.97 Fick/1.8 thermo, PVR 7.6 Fick/8.4 thermo.  - Echo (6/17): EF 60-65%, mild MR, mild RV dilation with moderately decreased systolic function, moderate TR, PASP 85 mmHg.  10. Pulmonary hypertension: See RHC above.  Concern for Group  1 PAH.  - TEE (6/16) with mildly dilated RV with mildly decreased systolic function. - ANA 5:462, anti-centromere antibody elevated, anti-SCL-70 antibody negative, h/o Raynauds - PFTs (7/16) with minimal obstructive defect but moderately decreased DLCO.  - V/Q scan (2/17) negative for acute or chronic PE.  11. Cardiolite (9/16) with no ischemia/infarction.  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  13. Subclinical hypothyroidism.  14. OSA: Severe on 9/17 sleep study. Now using CPAP.   SH: Married, lives in [place], no smoking, no ETOH.   FH: Adopted.  Daughter has Raynaud's.  ROS: All systems  reviewed and negative except as per HPI  Current Outpatient Prescriptions  Medication Sig Dispense Refill  . acetaminophen (TYLENOL) 325 MG tablet Take 2 tablets (650 mg total) by mouth every 4 (four) hours as needed for headache or mild pain.    Marland Kitchen apixaban (ELIQUIS) 5 MG TABS tablet Take 1 tablet (5 mg total) by mouth 2 (two) times daily. KEEP OV. 180 tablet 1  . Calcium Carb-Cholecalciferol (CALCIUM 600 + D PO) Take 1 tablet by mouth daily.    Marland Kitchen conjugated estrogens (PREMARIN) vaginal cream Place 1 Applicatorful vaginally once a week. Using 1.5 grams weekly    . diazepam (VALIUM) 5 MG tablet Take 1 tablet (5 mg total) by mouth every 8 (eight) hours as needed. for anxiety 30 tablet 2  . famotidine (PEPCID) 20 MG tablet Take 20 mg by mouth daily.     . furosemide (LASIX) 40 MG tablet Take 120 mg by mouth. Take 2 tablets in the morning and 1 tablet mid afternoon    . Macitentan (OPSUMIT) 10 MG TABS Take 1 tablet (10 mg total) by mouth daily. 30 tablet 11  . Multiple Vitamins-Minerals (CENTRUM SILVER) tablet Take 1 tablet by mouth daily.     Marland Kitchen oxymetazoline (AFRIN) 0.05 % nasal spray Place 1 spray into both nostrils 2 (two) times daily as needed for congestion.    . potassium chloride SA (KLOR-CON M20) 20 MEQ tablet Take 1 tablet (20 mEq total) by mouth daily. 90 tablet 3  . Probiotic Product (PROBIOTIC-10) CAPS Take by mouth.    . Selexipag (UPTRAVI) 600 MCG TABS Take 600 mcg by mouth 2 (two) times daily. 60 tablet 11  . tadalafil, PAH, (ADCIRCA) 20 MG tablet Take 2 tablets (40 mg total) by mouth daily. 180 tablet 2  . albuterol (PROVENTIL HFA;VENTOLIN HFA) 108 (90 Base) MCG/ACT inhaler Inhale 2 puffs into the lungs every 6 (six) hours as needed for wheezing or shortness of breath. (Patient not taking: Reported on 10/07/2016) 1 Inhaler 2   No current facility-administered medications for this encounter.    BP 128/69 (BP Location: Left Arm, Patient Position: Sitting, Cuff Size: Normal)   Pulse 83    Ht 5' (1.524 m)   Wt 153 lb 12.8 oz (69.8 kg)   SpO2 96%   BMI 30.04 kg/m  General: NAD Neck: JVP not elevated, no thyromegaly or thyroid nodule.  Lungs: CTAB CV: Nondisplaced PMI.  Heart regular S1/S2, no S3/S4, no murmur.  No edema, bilateral venous varicosities lower legs/feet.  No carotid bruit.  Normal pedal pulses.  Abdomen: Soft, nontender, no hepatosplenomegaly, no distention.  Skin: Intact without lesions or rashes.  Neurologic: Alert and oriented x 3.  Psych: Normal affect. Extremities: No clubbing or cyanosis.  HEENT: Normal.   Assessment/Plan: 1. Pulmonary hypertension: Patient has pulmonary arterial hypertension.  She appears to have co-existing diastolic LV dysfunction given elevated PCWP on RHC.  PFTs did not show significant obstruction, they only showed moderately decreased DLCO consistent with pulmonary vascular disease. V/Q scan did not show evidence for chronic PE.  PVR 7.6 WU by Fick and 8.4 WU by thermodiluation.  Cardiac index was low, 1.97 Fick/1.8 thermo.  Most recent echo in 6/17 still showed elevated PA pressure with a dysfunctional RV. 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  6 minute walk is a bit worse today compared to prior.  - She has sleep apnea, now using CPAP.  - Continue Opsumit and Selexipag, unable to increase Selexipag beyond 600 mcg bid due to side effects.  - As she is still somewhat limited with lower 6 minute walk, I will look into having her switch from Adcirca to riociguat.  Will make the change once it is approved.  - She is due for repeat echo in 6/18 to follow RV, PA pressure.  2. Chronic diastolic CHF: Elevated PCWP on 1/17 RHC.  Last echo in 6/17 with EF 60-65%.  NYHA class II-III, volume looks ok on exam.  - Continue Lasix 80 qam/40 qpm.  BMET/BNP today.    3. Atrial  fibrillation: Paroxysmal.  She is in NSR today.  Having more symptomatic breakthrough episodes.  Tikosyn would likely not be ideal due to low GFR, she would only be able to take 125 mcg bid.  I think that she would be a good candidate for atrial fibrillation ablation.  -  I will refer her to Dr. Curt Bears or Dr. [name] for evaluation for ablation.  - Continue Eliquis 5 mg bid.  CBC today.   4. CKD: Stage III. BMET today.   Loralie Champagne 12/09/2016

## 2016-12-09 NOTE — Patient Instructions (Addendum)
We will switch your Adicirca to [place] today   Your physician has requested that you have an echocardiogram. Echocardiography is a painless test that uses sound waves to create images of your heart. It provides your doctor with information about the size and shape of your heart and how well your heart's chambers and valves are working. This procedure takes approximately one hour. There are no restrictions for this procedure.  You have been referred to Dr Curt Bears or Dr [name] for a-fib ablation   Your physician recommends that you schedule a follow-up appointment in: 3 months

## 2016-12-09 NOTE — Progress Notes (Signed)
6 min walk test completed, pt ambulated 840 ft ( 256 m).

## 2016-12-23 ENCOUNTER — Other Ambulatory Visit (HOSPITAL_COMMUNITY): Payer: Self-pay | Admitting: Pharmacist

## 2016-12-23 MED ORDER — RIOCIGUAT 1 MG PO TABS: 1.0000 mg | ORAL_TABLET | Freq: Three times a day (TID) | ORAL | 11 refills | Status: DC

## 2016-12-24 ENCOUNTER — Telehealth (HOSPITAL_COMMUNITY): Payer: Self-pay | Admitting: Pharmacist

## 2016-12-24 NOTE — Telephone Encounter (Signed)
Adempas 1 mg TID PA approved by [employer] through 08/02/17.   Ruta Hinds. Velva Harman, PharmD, BCPS, CPP Clinical Pharmacist Pager: [phone] Phone: [phone] 12/24/2016 8:57 AM

## 2016-12-30 ENCOUNTER — Encounter: Payer: Self-pay | Admitting: Internal Medicine

## 2016-12-30 ENCOUNTER — Ambulatory Visit (INDEPENDENT_AMBULATORY_CARE_PROVIDER_SITE_OTHER): Payer: Medicare Other | Admitting: Internal Medicine

## 2016-12-30 VITALS — BP 116/78 | HR 89 | Ht 60.0 in | Wt 152.8 lb

## 2016-12-30 DIAGNOSIS — N183 Chronic kidney disease, stage 3 unspecified: Secondary | ICD-10-CM

## 2016-12-30 DIAGNOSIS — I272 Pulmonary hypertension, unspecified: Secondary | ICD-10-CM

## 2016-12-30 DIAGNOSIS — G4733 Obstructive sleep apnea (adult) (pediatric): Secondary | ICD-10-CM

## 2016-12-30 DIAGNOSIS — I480 Paroxysmal atrial fibrillation: Secondary | ICD-10-CM

## 2016-12-30 NOTE — Progress Notes (Signed)
Electrophysiology Office Note   Date:  12/30/2016   ID:  Tiffany [name], DOB 06/05/1946, MRN [id number]  PCP:  [name], MD  Cardiologist:  Dr [name] Primary Electrophysiologist: Thompson Grayer, MD    Chief Complaint  Patient presents with  . Atrial Fibrillation     History of Present Illness: Tiffany [name] is a 71 y.o. female who presents today for electrophysiology evaluation.   She has a h/o atrial fibrillation and is referred by Dr [name] for treatment strategies related to her afib.  She was diagnosed with atrial fibrillation on presentation for cataract surgery a couple years ago.  In retrospect, she thinks that she has had afib for several years prior.  She has [place] for which she is followed by Dr [name].  She also has severe sleep apnea for which she uses CPAP.  She has tried amiodarone previously in the setting of metoprolol and had symptomatic bradycardia with syncope.  She has symptoms of palpitations, presyncope and fatigue with her SOB.  Today, she denies symptoms of chest pain,  orthopnea, PND, lower extremity edema, claudication, dizziness, presyncope, syncope, bleeding, or neurologic sequela. The patient is tolerating medications without difficulties and is otherwise without complaint today.    Past Medical History:  Diagnosis Date  . Arthritis 04-20-12   Osteoarthritis-right hip  . Atrial fibrillation status post cardioversion, 01/30/15 maintaining SR.  01/31/2015   a. 01/2015 s/p TEE/DCCV;  b. 02/06/2015 recurrent AF noted, amio added;  c. CHA2DS2VASc = 3-->eliquis.  Marland Kitchen BENIGN POSITIONAL VERTIGO   . Cataract   . Complication of anesthesia 04-20-12   some issues with prolonged sedation after anesthesia  . Diverticulosis   . DYSLIPIDEMIA   . Esophageal stricture   . GERD   . HYPERTENSION    a. severe, pt intol of med tx, Pt. has severe "whitecoat" syndrome and refused medical therapy.  . Hypothyroidism 09/29/2014   a. pt did not tolerate synthroid and this  was subsequently discontinued.  . Mild LV dysfunction    a. 01/2015 Echo: EF 45-50%, mild MR, mildly dil LA, mod dil RV with mod to sev reduced fxn, mod-sev TR, PASP 63mmHg.  . Osteopenia   . Raynaud disease   . URINARY INCONTINENCE 04-20-12   occ. with nighttime sleep pattern   Past Surgical History:  Procedure Laterality Date  . breast ultrasound Right 09/13/13   There is no sonographic evidence of malignancy. the 7.3AL complicated cyst in (R) breast is consistent with a benign finding. repeat in 1 year  . CARDIAC CATHETERIZATION N/A 08/29/2015   Procedure: Right Heart Cath;  Surgeon: Larey Dresser, MD;  Location: [facility];  Service: Cardiovascular;  Laterality: N/A;  . CARDIOVERSION N/A 01/30/2015   Procedure: CARDIOVERSION;  Surgeon: Sanda Klein, MD;  Location: [facility];  Service: Cardiovascular;  Laterality: N/A;  . CHOLECYSTECTOMY     '90-"sludge"  . NASAL FRACTURE SURGERY  2006  . TEE WITHOUT CARDIOVERSION N/A 01/30/2015   Procedure: TRANSESOPHAGEAL ECHOCARDIOGRAM (TEE);  Surgeon: Sanda Klein, MD;  Location: [facility] ENDOSCOPY;  Service: Cardiovascular;  Laterality: N/A;  . TOTAL HIP ARTHROPLASTY  04/26/2012   Procedure: TOTAL HIP ARTHROPLASTY ANTERIOR APPROACH;  Surgeon: Mauri Pole, MD;  Location: [facility] ORS;  Service: Orthopedics;  Laterality: Right;  . TUBAL LIGATION  1980     Current Outpatient Prescriptions  Medication Sig Dispense Refill  . acetaminophen (TYLENOL) 325 MG tablet Take 2 tablets (650 mg total) by mouth every 4 (four) hours as  needed for headache or mild pain.    Marland Kitchen ADCIRCA 20 MG tablet Take 40 mg by mouth daily. Last dose on 12/30/16    . apixaban (ELIQUIS) 5 MG TABS tablet Take 1 tablet (5 mg total) by mouth 2 (two) times daily. KEEP OV. 180 tablet 1  . Calcium Carb-Cholecalciferol (CALCIUM 600 + D PO) Take 1 tablet by mouth daily.    Marland Kitchen conjugated estrogens (PREMARIN) vaginal cream Place 1 Applicatorful vaginally once a week. Using 1.5 grams weekly     . diazepam (VALIUM) 5 MG tablet Take 1 tablet (5 mg total) by mouth every 8 (eight) hours as needed. for anxiety 30 tablet 2  . famotidine (PEPCID) 20 MG tablet Take 20 mg by mouth daily.     . furosemide (LASIX) 40 MG tablet Take 2 tablets by mouth in the morning and 1 tablet mid afternoon    . Macitentan (OPSUMIT) 10 MG TABS Take 1 tablet (10 mg total) by mouth daily. 30 tablet 11  . Multiple Vitamins-Minerals (CENTRUM SILVER) tablet Take 1 tablet by mouth daily.     Marland Kitchen oxymetazoline (AFRIN) 0.05 % nasal spray Place 1 spray into both nostrils 2 (two) times daily as needed for congestion.    . potassium chloride SA (KLOR-CON M20) 20 MEQ tablet Take 1 tablet (20 mEq total) by mouth daily. 90 tablet 3  . Probiotic Product (PROBIOTIC-10) CAPS Take 1 capsule by mouth daily.     . Selexipag (UPTRAVI) 600 MCG TABS Take 600 mcg by mouth 2 (two) times daily. 60 tablet 11  . Riociguat (ADEMPAS) 1 MG TABS Take 1 mg by mouth 3 (three) times daily. (Patient not taking: Reported on 12/30/2016) 90 tablet 11   No current facility-administered medications for this visit.     Allergies:   Levothyroxine; Statins; Prednisone; and Penicillins   Social History:  The patient  reports that she has never smoked. She has never used smokeless tobacco. She reports that she drinks about 1.2 oz of alcohol per week . She reports that she does not use drugs.   Family History:  The patient was adopted and does not know her family history  ROS:  Please see the history of present illness.   All other systems are personally reviewed and negative.   PHYSICAL EXAM: VS:  BP 116/78   Pulse 89   Ht 5' (1.524 m)   Wt 152 lb 12.8 oz (69.3 kg)   SpO2 94%   BMI 29.84 kg/m  , BMI Body mass index is 29.84 kg/m. GEN: Well nourished, well developed, in no acute distress  HEENT: normal  Neck: no JVD, carotid bruits, or masses Cardiac: RRR; loud P2, no murmurs, rubs, or gallops,no edema  Respiratory:  clear to auscultation  bilaterally, normal work of breathing GI: soft, nontender, nondistended, + BS MS: no deformity or atrophy  Skin: warm and dry, flushed Neuro:  Strength and sensation are intact Psych: euthymic mood, full affect  EKG:  EKG 12/09/16 is reviewed  Recent Labs: 12/09/2016: ALT 12; B Natriuretic Peptide 201.2; BUN 26; Creatinine, Ser 1.80; Hemoglobin 13.1; Platelets 165; Potassium 4.1; Sodium 139  personally reviewed   Lipid Panel     Component Value Date/Time   CHOL 223 (H) 12/09/2016 1144   TRIG 204 (H) 12/09/2016 1144   TRIG 185 02/11/2009   HDL 62 12/09/2016 1144   CHOLHDL 3.6 12/09/2016 1144   VLDL 41 (H) 12/09/2016 1144   LDLCALC 120 (H) 12/09/2016 1144   personally reviewed  Wt Readings from Last 3 Encounters:  12/30/16 152 lb 12.8 oz (69.3 kg)  12/09/16 153 lb 12.8 oz (69.8 kg)  10/07/16 153 lb (69.4 kg)     Other studies personally reviewed: Additional studies/ records that were reviewed today include: prior echo, Dr Oleh Genin notes  Review of the above records today demonstrates: as above   ASSESSMENT AND PLAN:  1.  Paroxysmal atrial fibrillation The patient has symptomatic atrial fibrillation.  She has failed medical therapy with amiodarone.  Unfortunately, she has severe PAH which reduces our likelihood of long term success with maintaining sinus rhythm. Therapeutic strategies for afib including medicine (tikosyn) and ablation were discussed in detail with the patient today. Risk, benefits, and alternatives to EP study and radiofrequency ablation for afib were also discussed in detail today. These risks include but are not limited to stroke, bleeding, vascular damage, tamponade, perforation, damage to the esophagus, lungs, and other structures, pulmonary vein stenosis, worsening renal function, and death. The patient understands these risk and wishes to proceed.  We will therefore proceed with catheter ablation at the next available time.  Will plan tee prior to ablation.   No changes at this time. She is on eliquis for chads2vasc score of 3.  2. PAH Per Dr [name] Updated echo is pending for next week  3. OSA Compliance with CPAP encouraged  4. Hypertension Stable No change required today  Current medicines are reviewed at length with the patient today.   The patient does not have concerns regarding her medicines.  The following changes were made today:  none   Signed, Thompson Grayer, MD  12/30/2016 3:47 PM     [address] [place] [address] [phone] (office) [phone] (fax)

## 2016-12-30 NOTE — Patient Instructions (Addendum)
Medication Instructions:  Your physician recommends that you continue on your current medications as directed. Please refer to the Current Medication list given to you today.   Labwork: Your physician recommends that you return for lab work on 01/12/17: CBC/BMP   Testing/Procedures: Your physician has requested that you have a TEE. During a TEE, sound waves are used to create images of your heart. It provides your doctor with information about the size and shape of your heart and how well your heart's chambers and valves are working. In this test, a transducer is attached to the end of a flexible tube that's guided down your throat and into your esophagus (the tube leading from you mouth to your stomach) to get a more detailed image of your heart. You are not awake for the procedure. Please see the instruction sheet given to you today. For further information please visit [email or web address]  Please arrive at [facility] at 8:30am Do not eat or drink after midnight the night prior to the procedure Okay take any medications the morning of the test Will need someone to drive you home at discharge      Your physician has recommended that you have an ablation. Catheter ablation is a medical procedure used to treat some cardiac arrhythmias (irregular heartbeats). During catheter ablation, a long, thin, flexible tube is put into a blood vessel in your groin (upper thigh), or neck. This tube is called an ablation catheter. It is then guided to your heart through the blood vessel. Radio frequency waves destroy small areas of heart tissue where abnormal heartbeats may cause an arrhythmia to start. Please see the instruction sheet given to you today.--01/26/17  Please arrive at [facility] at 9:30am Do not eat or drink after midnight the night prior to the procedure Do not take any medications the morning of the  test Plan for one night stay Will need someone to drive you home at discharge      Follow-Up: Your physician recommends that you schedule a follow-up appointment in 4 weeks from 01/26/17 with Roderic Palau, NP and 3 months from 01/26/17 with Dr [name]   Any Other Special Instructions Will Be Listed Below (If Applicable).     If you need a refill on your cardiac medications before your next appointment, please call your pharmacy.

## 2017-01-06 ENCOUNTER — Ambulatory Visit (HOSPITAL_COMMUNITY)
Admission: RE | Admit: 2017-01-06 | Discharge: 2017-01-06 | Disposition: A | Discharge status: Home / Self Care | Payer: Medicare Other | Source: Ambulatory Visit | Attending: Internal Medicine | Admitting: Internal Medicine

## 2017-01-06 DIAGNOSIS — I5022 Chronic systolic (congestive) heart failure: Secondary | ICD-10-CM

## 2017-01-12 ENCOUNTER — Other Ambulatory Visit: Payer: Medicare Other | Admitting: *Deleted

## 2017-01-12 DIAGNOSIS — I4891 Unspecified atrial fibrillation: Secondary | ICD-10-CM

## 2017-01-12 DIAGNOSIS — I10 Essential (primary) hypertension: Secondary | ICD-10-CM

## 2017-01-12 LAB — BASIC METABOLIC PANEL
BUN/Creatinine Ratio: 18 (ref 12–28)
BUN: 28 mg/dL — ABNORMAL HIGH (ref 8–27)
CO2: 23 mmol/L (ref 20–29)
Calcium: 9.4 mg/dL (ref 8.7–10.3)
Chloride: 100 mmol/L (ref 96–106)
Creatinine, Ser: 1.6 mg/dL — ABNORMAL HIGH (ref 0.57–1.00)
GFR calc Af Amer: 37 mL/min/{1.73_m2} — ABNORMAL LOW (ref >59)
GFR calc non Af Amer: 32 mL/min/{1.73_m2} — ABNORMAL LOW (ref >59)
Glucose: 73 mg/dL (ref 65–99)
Potassium: 3.4 mmol/L — ABNORMAL LOW (ref 3.5–5.2)
Sodium: 140 mmol/L (ref 134–144)

## 2017-01-12 LAB — CBC WITH DIFFERENTIAL/PLATELET
Basophils Absolute: 0 10*3/uL (ref 0.0–0.2)
Basos: 0 %
EOS (ABSOLUTE): 0.2 10*3/uL (ref 0.0–0.4)
Eos: 5 %
Hematocrit: 35.9 % (ref 34.0–46.6)
Hemoglobin: 12.1 g/dL (ref 11.1–15.9)
Immature Grans (Abs): 0 10*3/uL (ref 0.0–0.1)
Immature Granulocytes: 0 %
Lymphocytes Absolute: 1.6 10*3/uL (ref 0.7–3.1)
Lymphs: 35 %
MCH: 30.2 pg (ref 26.6–33.0)
MCHC: 33.7 g/dL (ref 31.5–35.7)
MCV: 90 fL (ref 79–97)
Monocytes Absolute: 0.3 10*3/uL (ref 0.1–0.9)
Monocytes: 8 %
Neutrophils Absolute: 2.3 10*3/uL (ref 1.4–7.0)
Neutrophils: 52 %
Platelets: 168 10*3/uL (ref 150–379)
RBC: 4.01 x10E6/uL (ref 3.77–5.28)
RDW: 14.3 % (ref 12.3–15.4)
WBC: 4.5 10*3/uL (ref 3.4–10.8)

## 2017-01-12 NOTE — Addendum Note (Signed)
Addended by: Eulis Foster on: 01/12/2017 11:05 AM   Modules accepted: Orders

## 2017-01-12 NOTE — Progress Notes (Signed)
Bmp

## 2017-01-18 ENCOUNTER — Telehealth: Payer: Self-pay | Admitting: *Deleted

## 2017-01-18 DIAGNOSIS — E876 Hypokalemia: Secondary | ICD-10-CM

## 2017-01-18 NOTE — Telephone Encounter (Signed)
Pt is scheduled for an Atrial Fibrillation Ablation 01/26/17. Pt states she was told not to take any of her medications the morning of the ablation and that she should plan to spend the night.  Pt states she is on 3 medications for pulmonary hypertension, selexipag Malvin Johns), riociguat (Adempas), and macitentan (Opsumit) --all are ordered from a specialty pharmacy. Pt is concerned about the hospital having these in stock to give to her while she is hospitalized, she is asking if she should take her prescriptions of these medications to the hospital with her so she will be sure to get them while she is in the hospital.  Pt advised I will forward to Dr Jackalyn Lombard nurse, Claiborne Billings to follow-up with her.

## 2017-01-18 NOTE — Telephone Encounter (Signed)
Tiffany Grayer, MD  Katrine Coho, RN        Agree with the plan   Previous Messages          Basic metabolic panel  Order: [id number]  Status:  Final result Visible to patient:  Yes (MyChart) Dx:  Atrial fibrillation status post cardi...  Notes recorded by Katrine Coho, RN on 01/18/2017 at 9:34 AM EDT Spoke with patient, BMET has been scheduled for 01/19/17 ------  Notes recorded by Katrine Coho, RN on 01/12/2017 at 4:59 PM EDT I spoke with pt and confirmed that she is taking KCL 20 mEq daily. Per standing orders-pt advised to take extra KCL 20 mEq x 2 days, repeat BMET in 1 week. Pt agreed to take extra KCL 20 mEq x 2 days, asked that I forward to Dr [name] for review before coming back for repeat BMET.

## 2017-01-19 ENCOUNTER — Other Ambulatory Visit: Payer: Medicare Other

## 2017-01-19 ENCOUNTER — Other Ambulatory Visit: Payer: Medicare Other | Admitting: *Deleted

## 2017-01-19 DIAGNOSIS — E876 Hypokalemia: Secondary | ICD-10-CM

## 2017-01-19 LAB — BASIC METABOLIC PANEL
BUN/Creatinine Ratio: 18 (ref 12–28)
BUN: 34 mg/dL — ABNORMAL HIGH (ref 8–27)
CO2: 21 mmol/L (ref 20–29)
Calcium: 9.4 mg/dL (ref 8.7–10.3)
Chloride: 98 mmol/L (ref 96–106)
Creatinine, Ser: 1.85 mg/dL — ABNORMAL HIGH (ref 0.57–1.00)
GFR calc Af Amer: 31 mL/min/{1.73_m2} — ABNORMAL LOW (ref >59)
GFR calc non Af Amer: 27 mL/min/{1.73_m2} — ABNORMAL LOW (ref >59)
Glucose: 82 mg/dL (ref 65–99)
Potassium: 3.6 mmol/L (ref 3.5–5.2)
Sodium: 137 mmol/L (ref 134–144)

## 2017-01-20 NOTE — Telephone Encounter (Signed)
Spoke with [place] she advises to bring medications with her to the hospital. I have spoke with the patient and made aware.

## 2017-01-25 ENCOUNTER — Encounter (HOSPITAL_COMMUNITY): Payer: Self-pay | Admitting: *Deleted

## 2017-01-25 ENCOUNTER — Ambulatory Visit (HOSPITAL_COMMUNITY)
Admission: RE | Admit: 2017-01-25 | Discharge: 2017-01-25 | Disposition: A | Discharge status: Home / Self Care | Payer: Medicare Other | Source: Ambulatory Visit | Attending: Internal Medicine | Admitting: Internal Medicine

## 2017-01-25 ENCOUNTER — Ambulatory Visit (HOSPITAL_BASED_OUTPATIENT_CLINIC_OR_DEPARTMENT_OTHER): Payer: Medicare Other

## 2017-01-25 ENCOUNTER — Telehealth: Payer: Self-pay | Admitting: Internal Medicine

## 2017-01-25 ENCOUNTER — Encounter (HOSPITAL_COMMUNITY)
Admission: RE | Disposition: A | Discharge status: Home / Self Care | Payer: Self-pay | Source: Ambulatory Visit | Attending: Internal Medicine

## 2017-01-25 DIAGNOSIS — G4733 Obstructive sleep apnea (adult) (pediatric): Secondary | ICD-10-CM | POA: Insufficient documentation

## 2017-01-25 DIAGNOSIS — I340 Nonrheumatic mitral (valve) insufficiency: Secondary | ICD-10-CM | POA: Diagnosis not present

## 2017-01-25 DIAGNOSIS — K219 Gastro-esophageal reflux disease without esophagitis: Secondary | ICD-10-CM | POA: Insufficient documentation

## 2017-01-25 DIAGNOSIS — Z880 Allergy status to penicillin: Secondary | ICD-10-CM | POA: Insufficient documentation

## 2017-01-25 DIAGNOSIS — M8580 Other specified disorders of bone density and structure, unspecified site: Secondary | ICD-10-CM | POA: Insufficient documentation

## 2017-01-25 DIAGNOSIS — Z888 Allergy status to other drugs, medicaments and biological substances status: Secondary | ICD-10-CM | POA: Insufficient documentation

## 2017-01-25 DIAGNOSIS — I10 Essential (primary) hypertension: Secondary | ICD-10-CM | POA: Insufficient documentation

## 2017-01-25 DIAGNOSIS — I4891 Unspecified atrial fibrillation: Secondary | ICD-10-CM | POA: Diagnosis not present

## 2017-01-25 DIAGNOSIS — K5790 Diverticulosis of intestine, part unspecified, without perforation or abscess without bleeding: Secondary | ICD-10-CM | POA: Insufficient documentation

## 2017-01-25 DIAGNOSIS — I480 Paroxysmal atrial fibrillation: Secondary | ICD-10-CM | POA: Insufficient documentation

## 2017-01-25 DIAGNOSIS — E785 Hyperlipidemia, unspecified: Secondary | ICD-10-CM | POA: Diagnosis not present

## 2017-01-25 DIAGNOSIS — Z7901 Long term (current) use of anticoagulants: Secondary | ICD-10-CM | POA: Diagnosis not present

## 2017-01-25 DIAGNOSIS — Z96641 Presence of right artificial hip joint: Secondary | ICD-10-CM | POA: Diagnosis not present

## 2017-01-25 DIAGNOSIS — M1990 Unspecified osteoarthritis, unspecified site: Secondary | ICD-10-CM | POA: Insufficient documentation

## 2017-01-25 DIAGNOSIS — I7300 Raynaud's syndrome without gangrene: Secondary | ICD-10-CM | POA: Diagnosis not present

## 2017-01-25 DIAGNOSIS — H8110 Benign paroxysmal vertigo, unspecified ear: Secondary | ICD-10-CM | POA: Diagnosis not present

## 2017-01-25 DIAGNOSIS — E039 Hypothyroidism, unspecified: Secondary | ICD-10-CM | POA: Insufficient documentation

## 2017-01-25 HISTORY — PX: TEE WITHOUT CARDIOVERSION: SHX5443

## 2017-01-25 SURGERY — ECHOCARDIOGRAM, TRANSESOPHAGEAL
Anesthesia: Moderate Sedation

## 2017-01-25 MED ORDER — MIDAZOLAM HCL 5 MG/ML IJ SOLN
INTRAMUSCULAR | Status: AC
  Filled 2017-01-25: qty 2

## 2017-01-25 MED ORDER — SODIUM CHLORIDE 0.9 % IV SOLN
INTRAVENOUS | Status: DC
  Administered 2017-01-25: 09:00:00 via INTRAVENOUS

## 2017-01-25 MED ORDER — LIDOCAINE VISCOUS 2 % MT SOLN
OROMUCOSAL | Status: AC
  Filled 2017-01-25: qty 15

## 2017-01-25 MED ORDER — FENTANYL CITRATE (PF) 100 MCG/2ML IJ SOLN
INTRAMUSCULAR | Status: AC
  Filled 2017-01-25: qty 2

## 2017-01-25 MED ORDER — MIDAZOLAM HCL 10 MG/2ML IJ SOLN
INTRAMUSCULAR | Status: DC | PRN
  Administered 2017-01-25 (×6): 1 mg via INTRAVENOUS

## 2017-01-25 MED ORDER — LIDOCAINE VISCOUS 2 % MT SOLN
OROMUCOSAL | Status: DC | PRN
  Administered 2017-01-25: 10 mL via OROMUCOSAL

## 2017-01-25 MED ORDER — FENTANYL CITRATE (PF) 100 MCG/2ML IJ SOLN
INTRAMUSCULAR | Status: DC | PRN
  Administered 2017-01-25 (×5): 12.5 ug via INTRAVENOUS

## 2017-01-25 NOTE — Interval H&P Note (Signed)
History and Physical Interval Note:  01/25/2017 11:28 AM  Tiffany [name]  has presented today for surgery, with the diagnosis of afib  The various methods of treatment have been discussed with the patient and family. After consideration of risks, benefits and other options for treatment, the patient has consented to  Procedure(s): TRANSESOPHAGEAL ECHOCARDIOGRAM (TEE) (N/A) as a surgical intervention .  The patient's history has been reviewed, patient examined, no change in status, stable for surgery.  I have reviewed the patient's chart and labs.  Questions were answered to the patient's satisfaction.     Dorris Carnes

## 2017-01-25 NOTE — Telephone Encounter (Signed)
New message   Pt verbalized that he she is calling for rn to go over   her medications that she can and can not take before cath

## 2017-01-25 NOTE — Discharge Instructions (Signed)

## 2017-01-25 NOTE — Op Note (Signed)
LA, LAA without masses LVEF normal RVEF is mildly dliated with normal to mildly reduced function TV normal  Trace TR AV mildly thickened  No AI MV normal  Trace MR PV normal Minimal fixed atherosclerotic plaiquing on aorta.

## 2017-01-25 NOTE — Telephone Encounter (Signed)
Spoke with patient and went over instructions she was given on 12/30/16.  As I was talking to her Dr [name] asked if she could come to the hospital earlier than scheduled.  She said the earliest she could be here was at 7:00am.  Er Allred spoke with the patient as she was not happy with coming in early.

## 2017-01-25 NOTE — H&P (View-Only) (Signed)
Electrophysiology Office Note   Date:  12/30/2016   ID:  Tiffany [name], DOB 16-Apr-1946, MRN [id number]  PCP:  [name], MD  Cardiologist:  Dr [name] Primary Electrophysiologist: Thompson Grayer, MD    Chief Complaint  Patient presents with  . Atrial Fibrillation     History of Present Illness: Tiffany [name] is a 71 y.o. female who presents today for electrophysiology evaluation.   She has a h/o atrial fibrillation and is referred by Dr [name] for treatment strategies related to her afib.  She was diagnosed with atrial fibrillation on presentation for cataract surgery a couple years ago.  In retrospect, she thinks that she has had afib for several years prior.  She has [place] for which she is followed by Dr [name].  She also has severe sleep apnea for which she uses CPAP.  She has tried amiodarone previously in the setting of metoprolol and had symptomatic bradycardia with syncope.  She has symptoms of palpitations, presyncope and fatigue with her SOB.  Today, she denies symptoms of chest pain,  orthopnea, PND, lower extremity edema, claudication, dizziness, presyncope, syncope, bleeding, or neurologic sequela. The patient is tolerating medications without difficulties and is otherwise without complaint today.    Past Medical History:  Diagnosis Date  . Arthritis 04-20-12   Osteoarthritis-right hip  . Atrial fibrillation status post cardioversion, 01/30/15 maintaining SR.  01/31/2015   a. 01/2015 s/p TEE/DCCV;  b. 02/06/2015 recurrent AF noted, amio added;  c. CHA2DS2VASc = 3-->eliquis.  Marland Kitchen BENIGN POSITIONAL VERTIGO   . Cataract   . Complication of anesthesia 04-20-12   some issues with prolonged sedation after anesthesia  . Diverticulosis   . DYSLIPIDEMIA   . Esophageal stricture   . GERD   . HYPERTENSION    a. severe, pt intol of med tx, Pt. has severe "whitecoat" syndrome and refused medical therapy.  . Hypothyroidism 09/29/2014   a. pt did not tolerate synthroid and this  was subsequently discontinued.  . Mild LV dysfunction    a. 01/2015 Echo: EF 45-50%, mild MR, mildly dil LA, mod dil RV with mod to sev reduced fxn, mod-sev TR, PASP 64mmHg.  . Osteopenia   . Raynaud disease   . URINARY INCONTINENCE 04-20-12   occ. with nighttime sleep pattern   Past Surgical History:  Procedure Laterality Date  . breast ultrasound Right 09/13/13   There is no sonographic evidence of malignancy. the 5.3IR complicated cyst in (R) breast is consistent with a benign finding. repeat in 1 year  . CARDIAC CATHETERIZATION N/A 08/29/2015   Procedure: Right Heart Cath;  Surgeon: Larey Dresser, MD;  Location: [facility];  Service: Cardiovascular;  Laterality: N/A;  . CARDIOVERSION N/A 01/30/2015   Procedure: CARDIOVERSION;  Surgeon: Sanda Klein, MD;  Location: [facility];  Service: Cardiovascular;  Laterality: N/A;  . CHOLECYSTECTOMY     '90-"sludge"  . NASAL FRACTURE SURGERY  2006  . TEE WITHOUT CARDIOVERSION N/A 01/30/2015   Procedure: TRANSESOPHAGEAL ECHOCARDIOGRAM (TEE);  Surgeon: Sanda Klein, MD;  Location: [facility];  Service: Cardiovascular;  Laterality: N/A;  . TOTAL HIP ARTHROPLASTY  04/26/2012   Procedure: TOTAL HIP ARTHROPLASTY ANTERIOR APPROACH;  Surgeon: Mauri Pole, MD;  Location: [facility];  Service: Orthopedics;  Laterality: Right;  . TUBAL LIGATION  1980     Current Outpatient Prescriptions  Medication Sig Dispense Refill  . acetaminophen (TYLENOL) 325 MG tablet Take 2 tablets (650 mg total) by mouth every 4 (four) hours as  needed for headache or mild pain.    Marland Kitchen ADCIRCA 20 MG tablet Take 40 mg by mouth daily. Last dose on 12/30/16    . apixaban (ELIQUIS) 5 MG TABS tablet Take 1 tablet (5 mg total) by mouth 2 (two) times daily. KEEP OV. 180 tablet 1  . Calcium Carb-Cholecalciferol (CALCIUM 600 + D PO) Take 1 tablet by mouth daily.    Marland Kitchen conjugated estrogens (PREMARIN) vaginal cream Place 1 Applicatorful vaginally once a week. Using 1.5 grams weekly     . diazepam (VALIUM) 5 MG tablet Take 1 tablet (5 mg total) by mouth every 8 (eight) hours as needed. for anxiety 30 tablet 2  . famotidine (PEPCID) 20 MG tablet Take 20 mg by mouth daily.     . furosemide (LASIX) 40 MG tablet Take 2 tablets by mouth in the morning and 1 tablet mid afternoon    . Macitentan (OPSUMIT) 10 MG TABS Take 1 tablet (10 mg total) by mouth daily. 30 tablet 11  . Multiple Vitamins-Minerals (CENTRUM SILVER) tablet Take 1 tablet by mouth daily.     Marland Kitchen oxymetazoline (AFRIN) 0.05 % nasal spray Place 1 spray into both nostrils 2 (two) times daily as needed for congestion.    . potassium chloride SA (KLOR-CON M20) 20 MEQ tablet Take 1 tablet (20 mEq total) by mouth daily. 90 tablet 3  . Probiotic Product (PROBIOTIC-10) CAPS Take 1 capsule by mouth daily.     . Selexipag (UPTRAVI) 600 MCG TABS Take 600 mcg by mouth 2 (two) times daily. 60 tablet 11  . Riociguat (ADEMPAS) 1 MG TABS Take 1 mg by mouth 3 (three) times daily. (Patient not taking: Reported on 12/30/2016) 90 tablet 11   No current facility-administered medications for this visit.     Allergies:   Levothyroxine; Statins; Prednisone; and Penicillins   Social History:  The patient  reports that she has never smoked. She has never used smokeless tobacco. She reports that she drinks about 1.2 oz of alcohol per week . She reports that she does not use drugs.   Family History:  The patient was adopted and does not know her family history  ROS:  Please see the history of present illness.   All other systems are personally reviewed and negative.   PHYSICAL EXAM: VS:  BP 116/78   Pulse 89   Ht 5' (1.524 m)   Wt 152 lb 12.8 oz (69.3 kg)   SpO2 94%   BMI 29.84 kg/m  , BMI Body mass index is 29.84 kg/m. GEN: Well nourished, well developed, in no acute distress  HEENT: normal  Neck: no JVD, carotid bruits, or masses Cardiac: RRR; loud P2, no murmurs, rubs, or gallops,no edema  Respiratory:  clear to auscultation  bilaterally, normal work of breathing GI: soft, nontender, nondistended, + BS MS: no deformity or atrophy  Skin: warm and dry, flushed Neuro:  Strength and sensation are intact Psych: euthymic mood, full affect  EKG:  EKG 12/09/16 is reviewed  Recent Labs: 12/09/2016: ALT 12; B Natriuretic Peptide 201.2; BUN 26; Creatinine, Ser 1.80; Hemoglobin 13.1; Platelets 165; Potassium 4.1; Sodium 139  personally reviewed   Lipid Panel     Component Value Date/Time   CHOL 223 (H) 12/09/2016 1144   TRIG 204 (H) 12/09/2016 1144   TRIG 185 02/11/2009   HDL 62 12/09/2016 1144   CHOLHDL 3.6 12/09/2016 1144   VLDL 41 (H) 12/09/2016 1144   LDLCALC 120 (H) 12/09/2016 1144   personally reviewed  Wt Readings from Last 3 Encounters:  12/30/16 152 lb 12.8 oz (69.3 kg)  12/09/16 153 lb 12.8 oz (69.8 kg)  10/07/16 153 lb (69.4 kg)     Other studies personally reviewed: Additional studies/ records that were reviewed today include: prior echo, Dr Oleh Genin notes  Review of the above records today demonstrates: as above   ASSESSMENT AND PLAN:  1.  Paroxysmal atrial fibrillation The patient has symptomatic atrial fibrillation.  She has failed medical therapy with amiodarone.  Unfortunately, she has severe PAH which reduces our likelihood of long term success with maintaining sinus rhythm. Therapeutic strategies for afib including medicine (tikosyn) and ablation were discussed in detail with the patient today. Risk, benefits, and alternatives to EP study and radiofrequency ablation for afib were also discussed in detail today. These risks include but are not limited to stroke, bleeding, vascular damage, tamponade, perforation, damage to the esophagus, lungs, and other structures, pulmonary vein stenosis, worsening renal function, and death. The patient understands these risk and wishes to proceed.  We will therefore proceed with catheter ablation at the next available time.  Will plan tee prior to ablation.   No changes at this time. She is on eliquis for chads2vasc score of 3.  2. PAH Per Dr [name] Updated echo is pending for next week  3. OSA Compliance with CPAP encouraged  4. Hypertension Stable No change required today  Current medicines are reviewed at length with the patient today.   The patient does not have concerns regarding her medicines.  The following changes were made today:  none   Signed, Thompson Grayer, MD  12/30/2016 3:47 PM     [address] [place] [address] [phone] (office) [phone] (fax)

## 2017-01-26 ENCOUNTER — Observation Stay (HOSPITAL_COMMUNITY)
Admission: RE | Admit: 2017-01-26 | Discharge: 2017-01-27 | Disposition: A | Discharge status: Home / Self Care | Payer: Medicare Other | Source: Ambulatory Visit | Attending: Internal Medicine | Admitting: Internal Medicine

## 2017-01-26 ENCOUNTER — Ambulatory Visit (HOSPITAL_COMMUNITY): Payer: Medicare Other | Admitting: Certified Registered Nurse Anesthetist

## 2017-01-26 ENCOUNTER — Encounter (HOSPITAL_COMMUNITY)
Admission: RE | Disposition: A | Discharge status: Home / Self Care | Payer: Self-pay | Source: Ambulatory Visit | Attending: Internal Medicine

## 2017-01-26 DIAGNOSIS — M1611 Unilateral primary osteoarthritis, right hip: Secondary | ICD-10-CM | POA: Diagnosis not present

## 2017-01-26 DIAGNOSIS — I5032 Chronic diastolic (congestive) heart failure: Secondary | ICD-10-CM | POA: Insufficient documentation

## 2017-01-26 DIAGNOSIS — E039 Hypothyroidism, unspecified: Secondary | ICD-10-CM | POA: Insufficient documentation

## 2017-01-26 DIAGNOSIS — M8580 Other specified disorders of bone density and structure, unspecified site: Secondary | ICD-10-CM | POA: Diagnosis not present

## 2017-01-26 DIAGNOSIS — Z888 Allergy status to other drugs, medicaments and biological substances status: Secondary | ICD-10-CM | POA: Insufficient documentation

## 2017-01-26 DIAGNOSIS — I272 Pulmonary hypertension, unspecified: Secondary | ICD-10-CM | POA: Diagnosis not present

## 2017-01-26 DIAGNOSIS — K219 Gastro-esophageal reflux disease without esophagitis: Secondary | ICD-10-CM | POA: Diagnosis not present

## 2017-01-26 DIAGNOSIS — Z7901 Long term (current) use of anticoagulants: Secondary | ICD-10-CM | POA: Diagnosis not present

## 2017-01-26 DIAGNOSIS — E785 Hyperlipidemia, unspecified: Secondary | ICD-10-CM | POA: Insufficient documentation

## 2017-01-26 DIAGNOSIS — F419 Anxiety disorder, unspecified: Secondary | ICD-10-CM | POA: Diagnosis not present

## 2017-01-26 DIAGNOSIS — G4733 Obstructive sleep apnea (adult) (pediatric): Secondary | ICD-10-CM | POA: Diagnosis not present

## 2017-01-26 DIAGNOSIS — Z79899 Other long term (current) drug therapy: Secondary | ICD-10-CM | POA: Insufficient documentation

## 2017-01-26 DIAGNOSIS — Z880 Allergy status to penicillin: Secondary | ICD-10-CM | POA: Insufficient documentation

## 2017-01-26 DIAGNOSIS — Z96641 Presence of right artificial hip joint: Secondary | ICD-10-CM | POA: Diagnosis not present

## 2017-01-26 DIAGNOSIS — I4819 Other persistent atrial fibrillation: Secondary | ICD-10-CM | POA: Diagnosis present

## 2017-01-26 DIAGNOSIS — I130 Hypertensive heart and chronic kidney disease with heart failure and stage 1 through stage 4 chronic kidney disease, or unspecified chronic kidney disease: Secondary | ICD-10-CM | POA: Diagnosis not present

## 2017-01-26 DIAGNOSIS — Z8719 Personal history of other diseases of the digestive system: Secondary | ICD-10-CM | POA: Insufficient documentation

## 2017-01-26 DIAGNOSIS — M341 CR(E)ST syndrome: Secondary | ICD-10-CM | POA: Insufficient documentation

## 2017-01-26 DIAGNOSIS — I7300 Raynaud's syndrome without gangrene: Secondary | ICD-10-CM | POA: Diagnosis not present

## 2017-01-26 DIAGNOSIS — N183 Chronic kidney disease, stage 3 (moderate): Secondary | ICD-10-CM | POA: Insufficient documentation

## 2017-01-26 DIAGNOSIS — I480 Paroxysmal atrial fibrillation: Secondary | ICD-10-CM

## 2017-01-26 DIAGNOSIS — I481 Persistent atrial fibrillation: Secondary | ICD-10-CM | POA: Diagnosis present

## 2017-01-26 HISTORY — PX: ATRIAL FIBRILLATION ABLATION: EP1191

## 2017-01-26 LAB — POCT ACTIVATED CLOTTING TIME
Activated Clotting Time: 324 seconds
Activated Clotting Time: 312 seconds
Activated Clotting Time: 186 seconds

## 2017-01-26 SURGERY — ATRIAL FIBRILLATION ABLATION
Anesthesia: Monitor Anesthesia Care

## 2017-01-26 MED ORDER — ONDANSETRON HCL 4 MG/2ML IJ SOLN: 4.0000 mg | Freq: Four times a day (QID) | INTRAMUSCULAR | Status: DC | PRN

## 2017-01-26 MED ORDER — POTASSIUM CHLORIDE CRYS ER 20 MEQ PO TBCR
20.0000 meq | EXTENDED_RELEASE_TABLET | Freq: Every day | ORAL | Status: DC
  Administered 2017-01-27: 20 meq via ORAL
  Filled 2017-01-26: qty 1

## 2017-01-26 MED ORDER — PROPOFOL 500 MG/50ML IV EMUL
INTRAVENOUS | Status: DC | PRN
  Administered 2017-01-26: 75 ug/kg/min via INTRAVENOUS

## 2017-01-26 MED ORDER — BUPIVACAINE HCL (PF) 0.25 % IJ SOLN
INTRAMUSCULAR | Status: DC | PRN
  Administered 2017-01-26: 20 mL

## 2017-01-26 MED ORDER — MIDAZOLAM HCL 5 MG/5ML IJ SOLN
INTRAMUSCULAR | Status: DC | PRN
  Administered 2017-01-26 (×2): 1 mg via INTRAVENOUS

## 2017-01-26 MED ORDER — APIXABAN 5 MG PO TABS
5.0000 mg | ORAL_TABLET | Freq: Two times a day (BID) | ORAL | Status: DC
  Administered 2017-01-26 – 2017-01-27 (×2): 5 mg via ORAL
  Filled 2017-01-26 (×2): qty 1

## 2017-01-26 MED ORDER — PROTAMINE SULFATE 10 MG/ML IV SOLN
INTRAVENOUS | Status: DC | PRN
  Administered 2017-01-26: 30 mg via INTRAVENOUS

## 2017-01-26 MED ORDER — ACETAMINOPHEN 325 MG PO TABS
650.0000 mg | ORAL_TABLET | ORAL | Status: DC | PRN
  Administered 2017-01-26: 650 mg via ORAL
  Filled 2017-01-26: qty 2

## 2017-01-26 MED ORDER — DIAZEPAM 5 MG PO TABS
5.0000 mg | ORAL_TABLET | Freq: Once | ORAL | Status: AC
Start: 2017-01-26 — End: 2017-01-26
  Administered 2017-01-26: 5 mg via ORAL

## 2017-01-26 MED ORDER — SELEXIPAG 600 MCG PO TABS
600.0000 ug | ORAL_TABLET | ORAL | Status: DC
  Administered 2017-01-26 – 2017-01-27 (×2): 600 ug via ORAL
  Filled 2017-01-26 (×2): qty 1

## 2017-01-26 MED ORDER — IOPAMIDOL (ISOVUE-370) INJECTION 76%
INTRAVENOUS | Status: AC
  Filled 2017-01-26: qty 50

## 2017-01-26 MED ORDER — MACITENTAN 10 MG PO TABS
10.0000 mg | ORAL_TABLET | Freq: Every day | ORAL | Status: DC
  Filled 2017-01-26 (×2): qty 1

## 2017-01-26 MED ORDER — HEPARIN SODIUM (PORCINE) 1000 UNIT/ML IJ SOLN
INTRAMUSCULAR | Status: DC | PRN
  Administered 2017-01-26: 12000 [IU] via INTRAVENOUS
  Administered 2017-01-26: 1000 [IU] via INTRAVENOUS

## 2017-01-26 MED ORDER — ISOPROTERENOL HCL 0.2 MG/ML IJ SOLN
INTRAMUSCULAR | Status: AC
  Filled 2017-01-26: qty 5

## 2017-01-26 MED ORDER — BUPIVACAINE HCL (PF) 0.25 % IJ SOLN
INTRAMUSCULAR | Status: AC
  Filled 2017-01-26: qty 30

## 2017-01-26 MED ORDER — LIDOCAINE HCL (CARDIAC) 20 MG/ML IV SOLN
INTRAVENOUS | Status: DC | PRN
  Administered 2017-01-26: 40 mg via INTRAVENOUS

## 2017-01-26 MED ORDER — SODIUM CHLORIDE 0.9 % IV SOLN
INTRAVENOUS | Status: DC
  Administered 2017-01-26: 07:00:00 via INTRAVENOUS

## 2017-01-26 MED ORDER — PHENYLEPHRINE HCL 10 MG/ML IJ SOLN
INTRAMUSCULAR | Status: DC | PRN
  Administered 2017-01-26 (×2): 80 ug via INTRAVENOUS
  Administered 2017-01-26 (×3): 40 ug via INTRAVENOUS
  Administered 2017-01-26 (×2): 120 ug via INTRAVENOUS

## 2017-01-26 MED ORDER — HEPARIN SODIUM (PORCINE) 1000 UNIT/ML IJ SOLN
INTRAMUSCULAR | Status: DC | PRN
  Administered 2017-01-26: 2000 [IU] via INTRAVENOUS
  Administered 2017-01-26: 1000 [IU] via INTRAVENOUS

## 2017-01-26 MED ORDER — HEPARIN SODIUM (PORCINE) 1000 UNIT/ML IJ SOLN
INTRAMUSCULAR | Status: AC
  Filled 2017-01-26: qty 1

## 2017-01-26 MED ORDER — DIAZEPAM 5 MG PO TABS
5.0000 mg | ORAL_TABLET | Freq: Three times a day (TID) | ORAL | Status: DC | PRN
  Administered 2017-01-26: 5 mg via ORAL
  Filled 2017-01-26: qty 1

## 2017-01-26 MED ORDER — FUROSEMIDE 40 MG PO TABS
40.0000 mg | ORAL_TABLET | ORAL | Status: DC
  Filled 2017-01-26: qty 1

## 2017-01-26 MED ORDER — ISOPROTERENOL HCL 0.2 MG/ML IJ SOLN
INTRAVENOUS | Status: DC | PRN
  Administered 2017-01-26: 5 ug/min via INTRAVENOUS

## 2017-01-26 MED ORDER — PROPOFOL 10 MG/ML IV BOLUS
INTRAVENOUS | Status: DC | PRN
  Administered 2017-01-26: 20 mg via INTRAVENOUS

## 2017-01-26 MED ORDER — SODIUM CHLORIDE 0.9% FLUSH: 3.0000 mL | Freq: Two times a day (BID) | INTRAVENOUS | Status: DC

## 2017-01-26 MED ORDER — HYDROCODONE-ACETAMINOPHEN 5-325 MG PO TABS
1.0000 | ORAL_TABLET | ORAL | Status: DC | PRN
  Filled 2017-01-26: qty 1

## 2017-01-26 MED ORDER — SODIUM CHLORIDE 0.9 % IV SOLN: 250.0000 mL | INTRAVENOUS | Status: DC | PRN

## 2017-01-26 MED ORDER — SODIUM CHLORIDE 0.9% FLUSH: 3.0000 mL | INTRAVENOUS | Status: DC | PRN

## 2017-01-26 MED ORDER — HEPARIN (PORCINE) IN NACL 2-0.9 UNIT/ML-% IJ SOLN
INTRAMUSCULAR | Status: AC | PRN
  Administered 2017-01-26: 500 mL

## 2017-01-26 MED ORDER — IOPAMIDOL (ISOVUE-370) INJECTION 76%
INTRAVENOUS | Status: DC | PRN
  Administered 2017-01-26: 3 mL via INTRAVENOUS

## 2017-01-26 MED ORDER — FENTANYL CITRATE (PF) 100 MCG/2ML IJ SOLN
INTRAMUSCULAR | Status: DC | PRN
  Administered 2017-01-26 (×2): 50 ug via INTRAVENOUS

## 2017-01-26 MED ORDER — DIAZEPAM 5 MG PO TABS
ORAL_TABLET | ORAL | Status: AC
  Administered 2017-01-26: 5 mg via ORAL
  Filled 2017-01-26: qty 1

## 2017-01-26 MED ORDER — OXYMETAZOLINE HCL 0.05 % NA SOLN: 1.0000 | Freq: Two times a day (BID) | NASAL | Status: DC | PRN

## 2017-01-26 MED ORDER — RIOCIGUAT 1 MG PO TABS
1.0000 mg | ORAL_TABLET | ORAL | Status: DC
  Administered 2017-01-26 – 2017-01-27 (×2): 1 mg via ORAL
  Filled 2017-01-26 (×2): qty 1

## 2017-01-26 MED ORDER — FUROSEMIDE 40 MG PO TABS
80.0000 mg | ORAL_TABLET | Freq: Every day | ORAL | Status: DC
  Administered 2017-01-27: 80 mg via ORAL
  Filled 2017-01-26 (×3): qty 2

## 2017-01-26 SURGICAL SUPPLY — 19 items
BAG SNAP BAND KOVER 36X36 (MISCELLANEOUS) ×2 IMPLANT
BLANKET WARM UNDERBOD FULL ACC (MISCELLANEOUS) ×2 IMPLANT
CATH MAPPNG PENTARAY F 2-6-2MM (CATHETERS) IMPLANT
CATH NAVISTAR SMARTTOUCH DF (ABLATOR) ×1 IMPLANT
CATH SOUNDSTAR ECO REPROCESSED (CATHETERS) ×1 IMPLANT
CATH WEBSTER BI DIR CS D-F CRV (CATHETERS) ×1 IMPLANT
COVER SWIFTLINK CONNECTOR (BAG) ×2 IMPLANT
NDL TRANSEP BRK 71CM 407200 (NEEDLE) IMPLANT
NEEDLE TRANSEP BRK 71CM 407200 (NEEDLE) ×2 IMPLANT
PACK EP LATEX FREE (CUSTOM PROCEDURE TRAY) ×2
PACK EP LF (CUSTOM PROCEDURE TRAY) ×1 IMPLANT
PAD DEFIB LIFELINK (PAD) ×2 IMPLANT
PATCH CARTO3 (PAD) ×1 IMPLANT
PENTARAY F 2-6-2MM (CATHETERS) ×2
SHEATH AVANTI 11F 11CM (SHEATH) ×1 IMPLANT
SHEATH PINNACLE 7F 10CM (SHEATH) ×2 IMPLANT
SHEATH PINNACLE 9F 10CM (SHEATH) ×1 IMPLANT
SHEATH SWARTZ TS SL2 63CM 8.5F (SHEATH) ×1 IMPLANT
TUBING SMART ABLATE COOLFLOW (TUBING) ×1 IMPLANT

## 2017-01-26 NOTE — Progress Notes (Signed)
Anesthesia has delayed the start time for the procedure today. Pt is very unhappy. I have attempted to reassure the patient that we will proceed as soon as anesthesia is available.

## 2017-01-26 NOTE — Transfer of Care (Signed)
Immediate Anesthesia Transfer of Care Note  Patient: Tiffany [name]  Procedure(s) Performed: Procedure(s): Atrial Fibrillation Ablation (N/A)  Patient Location: PACU and Cath Lab  Anesthesia Type:MAC  Level of Consciousness: awake, alert , oriented and patient cooperative  Airway & Oxygen Therapy: Patient Spontanous Breathing and Patient connected to nasal cannula oxygen  Post-op Assessment: Report given to RN and Post -op Vital signs reviewed and stable  Post vital signs: Reviewed and stable BP 85/55, but patient non symptomatic and says she feels fine  Last Vitals:  Vitals:   01/26/17 0657  BP: 116/74  Pulse: 82  Temp: 36.6 C    Last Pain:  Vitals:   01/26/17 0657  TempSrc: Oral         Complications: No apparent anesthesia complications

## 2017-01-26 NOTE — Interval H&P Note (Signed)
History and Physical Interval Note:  01/26/2017 7:03 AM  Tiffany [name]  has presented today for surgery, with the diagnosis of afib  The various methods of treatment have been discussed with the patient and family. After consideration of risks, benefits and other options for treatment, the patient has consented to  Procedure(s): Atrial Fibrillation Ablation (N/A) as a surgical intervention .  The patient's history has been reviewed, patient examined, no change in status, stable for surgery.  I have reviewed the patient's chart and labs.  Questions were answered to the patient's satisfaction.   TEE is reviewed   Thompson Grayer

## 2017-01-26 NOTE — Anesthesia Postprocedure Evaluation (Signed)
Anesthesia Post Note  Patient: Tiffany [name]  Procedure(s) Performed: Procedure(s) (LRB): Atrial Fibrillation Ablation (N/A)     Patient location during evaluation: Cath Lab Anesthesia Type: MAC Level of consciousness: awake and alert Pain management: pain level controlled Vital Signs Assessment: post-procedure vital signs reviewed and stable Respiratory status: spontaneous breathing, nonlabored ventilation, respiratory function stable and patient connected to nasal cannula oxygen Cardiovascular status: stable and blood pressure returned to baseline Anesthetic complications: no    Last Vitals:  Vitals:   01/26/17 1450 01/26/17 1454  BP: 122/71   Pulse: (!) 50   Resp: 15   Temp:  36.3 C    Last Pain:  Vitals:   01/26/17 0657  TempSrc: Oral                 Ryan P Ellender

## 2017-01-26 NOTE — Progress Notes (Signed)
Site area: Right groin a 7,9,and 11 french  Venous sheath was removed  Site Prior to Removal:  Level 0  Pressure Applied For 20 MINUTES    Bedrest Beginning at 1555p  Manual:   Yes.    Patient Status During Pull:  stable  Post Pull Groin Site:  Level 0  Post Pull Instructions Given:  Yes.    Post Pull Pulses Present:  Yes.    Dressing Applied:  Yes.    Comments:  VS REMAIN STABLE

## 2017-01-26 NOTE — H&P (View-Only) (Signed)
Electrophysiology Office Note   Date:  12/30/2016   ID:  Tiffany [name], DOB [birth year], MRN [id number]  PCP:  [name], MD  Cardiologist:  Dr [name] Primary Electrophysiologist: Thompson Grayer, MD    Chief Complaint  Patient presents with  . Atrial Fibrillation     History of Present Illness: Tiffany [name] is a 71 y.o. female who presents today for electrophysiology evaluation.   She has a h/o atrial fibrillation and is referred by Dr [name] for treatment strategies related to her afib.  She was diagnosed with atrial fibrillation on presentation for cataract surgery a couple years ago.  In retrospect, she thinks that she has had afib for several years prior.  She has [place] for which she is followed by Dr [name].  She also has severe sleep apnea for which she uses CPAP.  She has tried amiodarone previously in the setting of metoprolol and had symptomatic bradycardia with syncope.  She has symptoms of palpitations, presyncope and fatigue with her SOB.  Today, she denies symptoms of chest pain,  orthopnea, PND, lower extremity edema, claudication, dizziness, presyncope, syncope, bleeding, or neurologic sequela. The patient is tolerating medications without difficulties and is otherwise without complaint today.    Past Medical History:  Diagnosis Date  . Arthritis 04-20-12   Osteoarthritis-right hip  . Atrial fibrillation status post cardioversion, 01/30/15 maintaining SR.  01/31/2015   a. 01/2015 s/p TEE/DCCV;  b. 02/06/2015 recurrent AF noted, amio added;  c. CHA2DS2VASc = 3-->eliquis.  Marland Kitchen BENIGN POSITIONAL VERTIGO   . Cataract   . Complication of anesthesia 04-20-12   some issues with prolonged sedation after anesthesia  . Diverticulosis   . DYSLIPIDEMIA   . Esophageal stricture   . GERD   . HYPERTENSION    a. severe, pt intol of med tx, Pt. has severe "whitecoat" syndrome and refused medical therapy.  . Hypothyroidism 09/29/2014   a. pt did not tolerate synthroid and this  was subsequently discontinued.  . Mild LV dysfunction    a. 01/2015 Echo: EF 45-50%, mild MR, mildly dil LA, mod dil RV with mod to sev reduced fxn, mod-sev TR, PASP 36mmHg.  . Osteopenia   . Raynaud disease   . URINARY INCONTINENCE 04-20-12   occ. with nighttime sleep pattern   Past Surgical History:  Procedure Laterality Date  . breast ultrasound Right 09/13/13   There is no sonographic evidence of malignancy. the 5.4MG complicated cyst in (R) breast is consistent with a benign finding. repeat in 1 year  . CARDIAC CATHETERIZATION N/A 08/29/2015   Procedure: Right Heart Cath;  Surgeon: Larey Dresser, MD;  Location: [facility];  Service: Cardiovascular;  Laterality: N/A;  . CARDIOVERSION N/A 01/30/2015   Procedure: CARDIOVERSION;  Surgeon: Sanda Klein, MD;  Location: [facility];  Service: Cardiovascular;  Laterality: N/A;  . CHOLECYSTECTOMY     '90-"sludge"  . NASAL FRACTURE SURGERY  2006  . TEE WITHOUT CARDIOVERSION N/A 01/30/2015   Procedure: TRANSESOPHAGEAL ECHOCARDIOGRAM (TEE);  Surgeon: Sanda Klein, MD;  [facility];  Service: Cardiovascular;  Laterality: N/A;  . TOTAL HIP ARTHROPLASTY  04/26/2012   Procedure: TOTAL HIP ARTHROPLASTY ANTERIOR APPROACH;  Surgeon: Mauri Pole, MD;  Location: [facility];  Service: Orthopedics;  Laterality: Right;  . TUBAL LIGATION  1980     Current Outpatient Prescriptions  Medication Sig Dispense Refill  . acetaminophen (TYLENOL) 325 MG tablet Take 2 tablets (650 mg total) by mouth every 4 (four) hours as  needed for headache or mild pain.    Marland Kitchen ADCIRCA 20 MG tablet Take 40 mg by mouth daily. Last dose on 12/30/16    . apixaban (ELIQUIS) 5 MG TABS tablet Take 1 tablet (5 mg total) by mouth 2 (two) times daily. KEEP OV. 180 tablet 1  . Calcium Carb-Cholecalciferol (CALCIUM 600 + D PO) Take 1 tablet by mouth daily.    Marland Kitchen conjugated estrogens (PREMARIN) vaginal cream Place 1 Applicatorful vaginally once a week. Using 1.5 grams weekly     . diazepam (VALIUM) 5 MG tablet Take 1 tablet (5 mg total) by mouth every 8 (eight) hours as needed. for anxiety 30 tablet 2  . famotidine (PEPCID) 20 MG tablet Take 20 mg by mouth daily.     . furosemide (LASIX) 40 MG tablet Take 2 tablets by mouth in the morning and 1 tablet mid afternoon    . Macitentan (OPSUMIT) 10 MG TABS Take 1 tablet (10 mg total) by mouth daily. 30 tablet 11  . Multiple Vitamins-Minerals (CENTRUM SILVER) tablet Take 1 tablet by mouth daily.     Marland Kitchen oxymetazoline (AFRIN) 0.05 % nasal spray Place 1 spray into both nostrils 2 (two) times daily as needed for congestion.    . potassium chloride SA (KLOR-CON M20) 20 MEQ tablet Take 1 tablet (20 mEq total) by mouth daily. 90 tablet 3  . Probiotic Product (PROBIOTIC-10) CAPS Take 1 capsule by mouth daily.     . Selexipag (UPTRAVI) 600 MCG TABS Take 600 mcg by mouth 2 (two) times daily. 60 tablet 11  . Riociguat (ADEMPAS) 1 MG TABS Take 1 mg by mouth 3 (three) times daily. (Patient not taking: Reported on 12/30/2016) 90 tablet 11   No current facility-administered medications for this visit.     Allergies:   Levothyroxine; Statins; Prednisone; and Penicillins   Social History:  The patient  reports that she has never smoked. She has never used smokeless tobacco. She reports that she drinks about 1.2 oz of alcohol per week . She reports that she does not use drugs.   Family History:  The patient was adopted and does not know her family history  ROS:  Please see the history of present illness.   All other systems are personally reviewed and negative.   PHYSICAL EXAM: VS:  BP 116/78   Pulse 89   Ht 5' (1.524 m)   Wt 152 lb 12.8 oz (69.3 kg)   SpO2 94%   BMI 29.84 kg/m  , BMI Body mass index is 29.84 kg/m. GEN: Well nourished, well developed, in no acute distress  HEENT: normal  Neck: no JVD, carotid bruits, or masses Cardiac: RRR; loud P2, no murmurs, rubs, or gallops,no edema  Respiratory:  clear to auscultation  bilaterally, normal work of breathing GI: soft, nontender, nondistended, + BS MS: no deformity or atrophy  Skin: warm and dry, flushed Neuro:  Strength and sensation are intact Psych: euthymic mood, full affect  EKG:  EKG 12/09/16 is reviewed  Recent Labs: 12/09/2016: ALT 12; B Natriuretic Peptide 201.2; BUN 26; Creatinine, Ser 1.80; Hemoglobin 13.1; Platelets 165; Potassium 4.1; Sodium 139  personally reviewed   Lipid Panel     Component Value Date/Time   CHOL 223 (H) 12/09/2016 1144   TRIG 204 (H) 12/09/2016 1144   TRIG 185 02/11/2009   HDL 62 12/09/2016 1144   CHOLHDL 3.6 12/09/2016 1144   VLDL 41 (H) 12/09/2016 1144   LDLCALC 120 (H) 12/09/2016 1144   personally reviewed  Wt Readings from Last 3 Encounters:  12/30/16 152 lb 12.8 oz (69.3 kg)  12/09/16 153 lb 12.8 oz (69.8 kg)  10/07/16 153 lb (69.4 kg)     Other studies personally reviewed: Additional studies/ records that were reviewed today include: prior echo, Dr Oleh Genin notes  Review of the above records today demonstrates: as above   ASSESSMENT AND PLAN:  1.  Paroxysmal atrial fibrillation The patient has symptomatic atrial fibrillation.  She has failed medical therapy with amiodarone.  Unfortunately, she has severe PAH which reduces our likelihood of long term success with maintaining sinus rhythm. Therapeutic strategies for afib including medicine (tikosyn) and ablation were discussed in detail with the patient today. Risk, benefits, and alternatives to EP study and radiofrequency ablation for afib were also discussed in detail today. These risks include but are not limited to stroke, bleeding, vascular damage, tamponade, perforation, damage to the esophagus, lungs, and other structures, pulmonary vein stenosis, worsening renal function, and death. The patient understands these risk and wishes to proceed.  We will therefore proceed with catheter ablation at the next available time.  Will plan tee prior to ablation.   No changes at this time. She is on eliquis for chads2vasc score of 3.  2. PAH Per Dr [name] Updated echo is pending for next week  3. OSA Compliance with CPAP encouraged  4. Hypertension Stable No change required today  Current medicines are reviewed at length with the patient today.   The patient does not have concerns regarding her medicines.  The following changes were made today:  none   Signed, Thompson Grayer, MD  12/30/2016 3:47 PM     [place] [address] [phone] (office) [phone] (fax)

## 2017-01-26 NOTE — Anesthesia Preprocedure Evaluation (Signed)
Anesthesia Evaluation  Patient identified by MRN, date of birth, ID band Patient awake    Reviewed: Allergy & Precautions, NPO status , Patient's Chart, lab work & pertinent test results  History of Anesthesia Complications Negative for: history of anesthetic complications  Airway Mallampati: II  TM Distance: >3 FB Neck ROM: Full    Dental  (+) Dental Advisory Given   Pulmonary neg pulmonary ROS,    breath sounds clear to auscultation       Cardiovascular hypertension, + dysrhythmias Atrial Fibrillation  Rhythm:Regular Rate:Normal  01/06/17 ECHO: Normal LV size with EF 83-66%, normal diastolic function. Normal RV size and systolic function. Borderline pulmonary hypertension. No significant valvular abnormalities.   Neuro/Psych Anxiety negative neurological ROS     GI/Hepatic Neg liver ROS, GERD  Medicated and Controlled,  Endo/Other  Hypothyroidism Morbid obesity  Renal/GU Renal InsufficiencyRenal disease (creat 1.85)     Musculoskeletal  (+) Arthritis ,   Abdominal (+) + obese,   Peds  Hematology eliquis   Anesthesia Other Findings   Reproductive/Obstetrics                             Anesthesia Physical Anesthesia Plan  ASA: III  Anesthesia Plan: MAC   Post-op Pain Management:    Induction: Intravenous  PONV Risk Score and Plan: 2 and Ondansetron and Dexamethasone  Airway Management Planned: Natural Airway and Simple Face Mask  Additional Equipment:   Intra-op Plan:   Post-operative Plan:   Informed Consent: I have reviewed the patients History and Physical, chart, labs and discussed the procedure including the risks, benefits and alternatives for the proposed anesthesia with the patient or authorized representative who has indicated his/her understanding and acceptance.   Dental advisory given  Plan Discussed with: CRNA and Surgeon  Anesthesia Plan Comments: (Plan routine  monitors, MAC)        Anesthesia Quick Evaluation

## 2017-01-27 ENCOUNTER — Encounter (HOSPITAL_COMMUNITY): Payer: Self-pay | Admitting: Internal Medicine

## 2017-01-27 DIAGNOSIS — I481 Persistent atrial fibrillation: Secondary | ICD-10-CM

## 2017-01-27 DIAGNOSIS — I5032 Chronic diastolic (congestive) heart failure: Secondary | ICD-10-CM | POA: Diagnosis not present

## 2017-01-27 DIAGNOSIS — I270 Primary pulmonary hypertension: Secondary | ICD-10-CM

## 2017-01-27 DIAGNOSIS — I130 Hypertensive heart and chronic kidney disease with heart failure and stage 1 through stage 4 chronic kidney disease, or unspecified chronic kidney disease: Secondary | ICD-10-CM | POA: Diagnosis not present

## 2017-01-27 DIAGNOSIS — I480 Paroxysmal atrial fibrillation: Secondary | ICD-10-CM | POA: Diagnosis not present

## 2017-01-27 DIAGNOSIS — N183 Chronic kidney disease, stage 3 (moderate): Secondary | ICD-10-CM | POA: Diagnosis not present

## 2017-01-27 NOTE — Discharge Instructions (Signed)
No driving for 4 days. No lifting over 5 lbs for 1 week. No vigorous or sexual activity for 1 week. You may return to work on 02/02/17. Keep procedure site clean & dry. If you notice increased pain, swelling, bleeding or pus, call/return!  You may shower, but no soaking baths/hot tubs/pools for 1 week.    You have an appointment set up with the [facility].  Multiple studies have shown that being followed by a dedicated atrial fibrillation clinic in addition to the standard care you receive from your other physicians improves health. We believe that enrollment in the atrial fibrillation clinic will allow [place] to better care for you.   The phone number to the [facility] is [phone]. The clinic is staffed Monday through Friday from 8:30am to 5pm.  Parking Directions: The clinic is located in the Heart and Vascular Building connected to [facility]. 1)From [address] turn on to [address] and go to the 3rd entrance  (Heart and Vascular entrance) on the right. 2)Look to the right for Heart &Vascular Parking Garage. 3)A code for the entrance is required please call the clinic to receive this.   4)Take the elevators to the 1st floor. Registration is in the room with the glass walls at the end of the hallway.  If you have any trouble parking or locating the clinic, please dont hesitate to call [phone].

## 2017-01-27 NOTE — Progress Notes (Signed)
Place patient on CPAP for the night.  Patient is tolerating well at this time.

## 2017-01-27 NOTE — Care Management Obs Status (Signed)
[place] NOTIFICATION   Patient Details  Name: MELAYSIA STREED MRN: [id number] Date of Birth: September 12, 1945   Medicare Observation Status Notification Given:  Yes    Bethena Roys, RN 01/27/2017, 10:33 AM

## 2017-01-27 NOTE — Discharge Summary (Signed)
ELECTROPHYSIOLOGY PROCEDURE DISCHARGE SUMMARY    Patient ID: Tiffany [name],  MRN: [id number], DOB/AGE: [birth year] 71 y.o.  Admit date: 01/26/2017 Discharge date: 01/27/2017  Primary Care Physician: [name], MD  Primary Cardiologist: Dr. [name] Electrophysiologist: Thompson Grayer, MD  Primary Discharge Diagnosis:  1. Paroxysmal AFib     CHA2DS2Vasc is at least 3, on Eliquis  Secondary Discharge Diagnosis:  1. Chronic CHF (diastolic) 2. CRI (III) 3. HTN 4. Hypothyroidism 5. Incomplete CREST syndrome 6. Reynaud's 7. Pulmonary HTN  Procedures This Admission:  1.  Electrophysiology study and radiofrequency catheter ablation on 01/25/17 by Dr Thompson Grayer.   This study demonstrated: 1. Sinus rhythm upon presentation.   2. Intracardiac echo reveals a moderate sized left atrium with four separate pulmonary veins without evidence of pulmonary vein stenosis. 3. Successful electrical isolation and anatomical encircling of all four pulmonary veins with radiofrequency current. 4. No inducible arrhythmias following ablation both on and off of Isuprel 5. No early apparent complications.  Brief HPI: Tiffany [name] is a 71 y.o. female with a history of paroxysmal atrial fibrillation.  She has failed medical therapy with amiodarone. Risks, benefits, and alternatives to catheter ablation of atrial fibrillation were reviewed with the patient who wished to proceed.  The patient underwent TEE prior to the procedure which demonstrated normal LV function and no LAA thrombus.    Hospital Course:  The patient was admitted and underwent EPS/RFCA of atrial fibrillation with details as outlined above.  She was monitored on telemetry overnight which demonstrated sinus rhythm.   Groin was without complication on the day of discharge.  The patient was examined by Dr. [name] and considered to be stable for discharge.  Wound care and restrictions were reviewed with the patient.  The patient will  be seen back by Roderic Palau, NP in 4 weeks and Dr [name] in 12 weeks for post ablation follow up.    Physical Exam: Vitals:   01/26/17 2230 01/26/17 2323 01/26/17 2347 01/27/17 0434  BP:    111/62  Pulse:  90  89  Resp:  18  20  Temp: 100.1 F (37.8 C)  100.2 F (37.9 C) 99 F (37.2 C)  TempSrc: Oral  Oral Oral  SpO2:  93%  90%  Weight:    152 lb 12.8 oz (69.3 kg)  Height:        GEN- The patient is well appearing, alert and oriented x 3 today.   HEENT: normocephalic, atraumatic; sclera clear, conjunctiva pink; hearing intact; oropharynx clear; neck supple  Lungs-  CTA b/l, normal work of breathing.  No wheezes, rales, rhonchi Heart-  RRR, no murmurs, rubs or gallops  GI- soft, non-tender, non-distended  Extremities- no clubbing, cyanosis, or edema; DP/PT/radial pulses 2+ bilaterally,  groin without hematoma/bruit MS- no significant deformity or atrophy Skin- warm and dry, no rash or lesion Psych- euthymic mood, full affect Neuro- strength and sensation are intact   Labs:   Lab Results  Component Value Date   WBC 4.5 01/12/2017   HGB 12.1 01/12/2017   HCT 35.9 01/12/2017   MCV 90 01/12/2017   PLT 168 01/12/2017   No results for input(s): NA, K, CL, CO2, BUN, CREATININE, CALCIUM, PROT, BILITOT, ALKPHOS, ALT, AST, GLUCOSE in the last 168 hours.  Invalid input(s): LABALBU   Discharge Medications:  Allergies as of 01/27/2017      Reactions   Levothyroxine Shortness Of Breath, Other (See Comments)   Exhaustion also - Per pt her  this medication could have not been the reason for her symptoms   Statins Other (See Comments)   "Pain, weakness and kidney problems"   Prednisone Other (See Comments)   Severe insomnia   Penicillins Other (See Comments)   dry mouth Has patient had a PCN reaction causing immediate rash, facial/tongue/throat swelling, SOB or lightheadedness with hypotension: No Has patient had a PCN reaction causing severe rash involving mucus membranes or  skin necrosis: No Has patient had a PCN reaction that required hospitalization No Has patient had a PCN reaction occurring within the last 10 years: No If all of the above answers are "NO", then may proceed with Cephalosporin use.      Medication List    TAKE these medications   acetaminophen 325 MG tablet Commonly known as:  TYLENOL Take 2 tablets (650 mg total) by mouth every 4 (four) hours as needed for headache or mild pain.   apixaban 5 MG Tabs tablet Commonly known as:  ELIQUIS Take 1 tablet (5 mg total) by mouth 2 (two) times daily. KEEP OV. What changed:  additional instructions   CALCIUM 600 + D PO Take 1 tablet by mouth daily.   CENTRUM SILVER tablet Take 1 tablet by mouth daily.   conjugated estrogens vaginal cream Commonly known as:  PREMARIN Place 1 Applicatorful vaginally once a week. Using 1.5 grams weekly   diazepam 5 MG tablet Commonly known as:  VALIUM Take 1 tablet (5 mg total) by mouth every 8 (eight) hours as needed. for anxiety   famotidine 20 MG tablet Commonly known as:  PEPCID Take 20 mg by mouth daily at 12 noon.   furosemide 40 MG tablet Commonly known as:  LASIX Take 40-80 mg by mouth 2 (two) times daily. Take 80 mg by mouth in the morning and 40mg   mid afternoon   Macitentan 10 MG Tabs Commonly known as:  OPSUMIT Take 1 tablet (10 mg total) by mouth daily. What changed:  when to take this   oxymetazoline 0.05 % nasal spray Commonly known as:  AFRIN Place 1 spray into both nostrils 2 (two) times daily as needed for congestion.   potassium chloride SA 20 MEQ tablet Commonly known as:  KLOR-CON M20 Take 1 tablet (20 mEq total) by mouth daily.   PROBIOTIC-10 Caps Take 1 capsule by mouth daily.   Riociguat 1 MG Tabs Commonly known as:  ADEMPAS Take 1 mg by mouth 3 (three) times daily. What changed:  additional instructions   Selexipag 600 MCG Tabs Commonly known as:  UPTRAVI Take 600 mcg by mouth 2 (two) times daily. What  changed:  additional instructions       Disposition:  Home  Follow-up Information    [facility] Follow up on 02/23/2017.   Specialty:  Cardiology Why:  10:30AM Contact information: [address] [id number] mc [address] [place]       Larey Dresser, MD Follow up on 03/16/2017.   Specialty:  Cardiology Why:  11:20AM Contact information: [place] [address] [phone]        Thompson Grayer, MD Follow up on 05/10/2017.   Specialty:  Cardiology Why:  11:00AM Contact information: [place] [address] [phone]           Duration of Discharge Encounter: Greater than 30 minutes including physician time.  Venetia Night, PA-C 01/27/2017 8:37 AM   I have seen, examined the  patient, and reviewed the above assessment and plan.  On exam, RRR.  No concerns. Changes to above are made where necessary.  DC to  Home with routine post ablation care.  Co Sign: Thompson Grayer, MD 01/27/2017 8:37 AM

## 2017-01-27 NOTE — Progress Notes (Signed)
Pt taken off of bedrest. Pt ambulated to bathroom and back to bed with no issues. Assessed right groin, level 0.

## 2017-01-28 ENCOUNTER — Telehealth: Payer: Self-pay | Admitting: *Deleted

## 2017-01-28 NOTE — Telephone Encounter (Signed)
Pt was on TCM list admitted for Paroxysmal AFib, Chronic CHF (diastolic). Pt was D/C 6/27, and will f/u with  [facility] Follow up on 02/23/2017.   Specialty:  Cardiology

## 2017-02-23 ENCOUNTER — Ambulatory Visit (HOSPITAL_COMMUNITY)
Admission: RE | Admit: 2017-02-23 | Discharge: 2017-02-23 | Disposition: A | Discharge status: Home / Self Care | Payer: Medicare Other | Source: Ambulatory Visit | Attending: Nurse Practitioner | Admitting: Nurse Practitioner

## 2017-02-23 ENCOUNTER — Encounter (HOSPITAL_COMMUNITY): Payer: Self-pay | Admitting: Nurse Practitioner

## 2017-02-23 VITALS — BP 126/82 | HR 75 | Ht 60.0 in | Wt 151.8 lb

## 2017-02-23 DIAGNOSIS — Z888 Allergy status to other drugs, medicaments and biological substances status: Secondary | ICD-10-CM | POA: Diagnosis not present

## 2017-02-23 DIAGNOSIS — M1611 Unilateral primary osteoarthritis, right hip: Secondary | ICD-10-CM | POA: Insufficient documentation

## 2017-02-23 DIAGNOSIS — E039 Hypothyroidism, unspecified: Secondary | ICD-10-CM | POA: Diagnosis not present

## 2017-02-23 DIAGNOSIS — G4733 Obstructive sleep apnea (adult) (pediatric): Secondary | ICD-10-CM | POA: Insufficient documentation

## 2017-02-23 DIAGNOSIS — Z79899 Other long term (current) drug therapy: Secondary | ICD-10-CM | POA: Diagnosis not present

## 2017-02-23 DIAGNOSIS — Z7901 Long term (current) use of anticoagulants: Secondary | ICD-10-CM | POA: Diagnosis not present

## 2017-02-23 DIAGNOSIS — K219 Gastro-esophageal reflux disease without esophagitis: Secondary | ICD-10-CM | POA: Insufficient documentation

## 2017-02-23 DIAGNOSIS — I10 Essential (primary) hypertension: Secondary | ICD-10-CM | POA: Diagnosis not present

## 2017-02-23 DIAGNOSIS — Z880 Allergy status to penicillin: Secondary | ICD-10-CM | POA: Insufficient documentation

## 2017-02-23 DIAGNOSIS — I480 Paroxysmal atrial fibrillation: Secondary | ICD-10-CM | POA: Diagnosis present

## 2017-02-23 NOTE — Progress Notes (Signed)
Primary Care Physician: Tiffany Koch, MD Referring Physician: Dr. [name] Cardiologist: Dr. Brantley [name] is a 71 y.o. female with a h/o atrial fibrillation and is referred by Dr Tiffany [name] to Dr. [name] for treatment strategies related to her afib.  She was diagnosed with atrial fibrillation on presentation for cataract surgery a couple years ago.  In retrospect, she thinks that she has had afib for several years prior.  She has [place] for which she is followed by Dr Tiffany [name].  She also has severe sleep apnea for which she uses CPAP.  She has tried amiodarone previously in the setting of metoprolol and had symptomatic bradycardia with syncope.  She had symptoms of palpitations, presyncope, dyspnea with afib.  She underwent afib ablation 6/25. She reports two short episodes of afib that lasted less than 20 mins in the first two weeks following ablation. No afib in the last two weeks. She states that her 6 min walk test has improved since surgery. She is on a new med for PHT, [place]. No swallowing or groin issues since the procedure.  Today, she denies symptoms of palpitations, chest pain, shortness of breath, orthopnea, PND, lower extremity edema, dizziness, presyncope, syncope, or neurologic sequela. The patient is tolerating medications without difficulties and is otherwise without complaint today.   Past Medical History:  Diagnosis Date  . Arthritis 04-20-12   Osteoarthritis-right hip  . Atrial fibrillation status post cardioversion, 01/30/15 maintaining SR.  01/31/2015   a. 01/2015 s/p TEE/DCCV;  b. 02/06/2015 recurrent AF noted, amio added;  c. CHA2DS2VASc = 3-->eliquis.  Marland Kitchen BENIGN POSITIONAL VERTIGO   . Cataract   . Complication of anesthesia 04-20-12   some issues with prolonged sedation after anesthesia  . Diverticulosis   . DYSLIPIDEMIA   . Esophageal stricture   . GERD   . HYPERTENSION    a. severe, pt intol of med tx, Pt. has severe "whitecoat" syndrome and refused medical  therapy.  . Hypothyroidism 09/29/2014   a. pt did not tolerate synthroid and this was subsequently discontinued.  . Mild LV dysfunction    a. 01/2015 Echo: EF 45-50%, mild MR, mildly dil LA, mod dil RV with mod to sev reduced fxn, mod-sev TR, PASP 69mmHg.  . Osteopenia   . Raynaud disease   . URINARY INCONTINENCE 04-20-12   occ. with nighttime sleep pattern   Past Surgical History:  Procedure Laterality Date  . ATRIAL FIBRILLATION ABLATION N/A 01/26/2017   Procedure: Atrial Fibrillation Ablation;  Surgeon: Tiffany Grayer, MD;  Location: [facility];  Service: Cardiovascular;  Laterality: N/A;  . breast ultrasound Right 09/13/13   There is no sonographic evidence of malignancy. the 6.0FU complicated cyst in (R) breast is consistent with a benign finding. repeat in 1 year  . CARDIAC CATHETERIZATION N/A 08/29/2015   Procedure: Right Heart Cath;  Surgeon: Larey Dresser, MD;  Location: [facility];  Service: Cardiovascular;  Laterality: N/A;  . CARDIOVERSION N/A 01/30/2015   Procedure: CARDIOVERSION;  Surgeon: Sanda Klein, MD;  Location: [facility];  Service: Cardiovascular;  Laterality: N/A;  . CHOLECYSTECTOMY     '90-"sludge"  . NASAL FRACTURE SURGERY  2006  . TEE WITHOUT CARDIOVERSION N/A 01/30/2015   Procedure: TRANSESOPHAGEAL ECHOCARDIOGRAM (TEE);  Surgeon: Sanda Klein, MD;  Location: [facility];  Service: Cardiovascular;  Laterality: N/A;  . TEE WITHOUT CARDIOVERSION N/A 01/25/2017   Procedure: TRANSESOPHAGEAL ECHOCARDIOGRAM (TEE);  Surgeon: Fay Records, MD;  Location: [facility];  Service: Cardiovascular;  Laterality: N/A;  . TOTAL HIP ARTHROPLASTY  04/26/2012   Procedure: TOTAL HIP ARTHROPLASTY ANTERIOR APPROACH;  Surgeon: Mauri Pole, MD;  Location: [facility];  Service: Orthopedics;  Laterality: Right;  . TUBAL LIGATION  1980    Current Outpatient Prescriptions  Medication Sig Dispense Refill  . acetaminophen (TYLENOL) 325 MG tablet Take 2 tablets (650 mg  total) by mouth every 4 (four) hours as needed for headache or mild pain.    Marland Kitchen apixaban (ELIQUIS) 5 MG TABS tablet Take 1 tablet (5 mg total) by mouth 2 (two) times daily. KEEP OV. (Patient taking differently: Take 5 mg by mouth 2 (two) times daily. At breakfast and 9 or 10 pm) 180 tablet 1  . Calcium Carb-Cholecalciferol (CALCIUM 600 + D PO) Take 1 tablet by mouth daily.    Marland Kitchen conjugated estrogens (PREMARIN) vaginal cream Place 1 Applicatorful vaginally once a week. Using 1.5 grams weekly    . diazepam (VALIUM) 5 MG tablet Take 1 tablet (5 mg total) by mouth every 8 (eight) hours as needed. for anxiety 30 tablet 2  . famotidine (PEPCID) 20 MG tablet Take 20 mg by mouth daily at 12 noon.     . furosemide (LASIX) 40 MG tablet Take 40-80 mg by mouth 2 (two) times daily. Take 80 mg by mouth in the morning and 40mg   mid afternoon    . Macitentan (OPSUMIT) 10 MG TABS Take 1 tablet (10 mg total) by mouth daily. (Patient taking differently: Take 10 mg by mouth daily at 12 noon. ) 30 tablet 11  . Multiple Vitamins-Minerals (CENTRUM SILVER) tablet Take 1 tablet by mouth daily.     Marland Kitchen oxymetazoline (AFRIN) 0.05 % nasal spray Place 1 spray into both nostrils 2 (two) times daily as needed for congestion.    . potassium chloride SA (KLOR-CON M20) 20 MEQ tablet Take 1 tablet (20 mEq total) by mouth daily. 90 tablet 3  . Probiotic Product (PROBIOTIC-10) CAPS Take 1 capsule by mouth daily.     . Riociguat (ADEMPAS) 1 MG TABS Take 1 mg by mouth 3 (three) times daily. (Patient taking differently: Take 1 mg by mouth 3 (three) times daily. At breakfast, 3pm, and 9 or 10pm.  titrating) 90 tablet 11  . Selexipag (UPTRAVI) 600 MCG TABS Take 600 mcg by mouth 2 (two) times daily. (Patient taking differently: Take 600 mcg by mouth 2 (two) times daily. At breakfast and 9 or 10 pm) 60 tablet 11   No current facility-administered medications for this encounter.     Allergies  Allergen Reactions  . Levothyroxine Shortness Of  Breath and Other (See Comments)    Exhaustion also - Per pt her this medication could have not been the reason for her symptoms  . Statins Other (See Comments)    "Pain, weakness and kidney problems"  . Prednisone Other (See Comments)    Severe insomnia  . Penicillins Other (See Comments)    dry mouth Has patient had a PCN reaction causing immediate rash, facial/tongue/throat swelling, SOB or lightheadedness with hypotension: No Has patient had a PCN reaction causing severe rash involving mucus membranes or skin necrosis: No Has patient had a PCN reaction that required hospitalization No Has patient had a PCN reaction occurring within the last 10 years: No If all of the above answers are "NO", then may proceed with Cephalosporin use.     Social History   Social History  . Marital status: Married    Spouse name: N/A  .  Number of children: 2  . Years of education: N/A   Occupational History  . Not on file.   Social History Main Topics  . Smoking status: Never Smoker  . Smokeless tobacco: Never Used  . Alcohol use 1.2 oz/week    2 Glasses of wine per week     Comment: several glasses wine weekly  . Drug use: No  . Sexual activity: Yes   Other Topics Concern  . Not on file   Social History Narrative   She enjoys birding.  Lives at home with husband.   Married.   retired Quarry manager, Tax inspector    Family History  Problem Relation Age of Onset  . Adopted: Yes  . Other Unknown        patient was adopted    ROS- All systems are reviewed and negative except as per the HPI above  Physical Exam: Vitals:   02/23/17 1035  BP: 126/82  Pulse: 75  Weight: 151 lb 12.8 oz (68.9 kg)  Height: 5' (1.524 m)   Wt Readings from Last 3 Encounters:  02/23/17 151 lb 12.8 oz (68.9 kg)  01/27/17 152 lb 12.8 oz (69.3 kg)  01/25/17 152 lb (68.9 kg)    Labs: Lab Results  Component Value Date   NA 137 01/19/2017   K 3.6 01/19/2017   CL 98 01/19/2017    CO2 21 01/19/2017   GLUCOSE 82 01/19/2017   BUN 34 (H) 01/19/2017   CREATININE 1.85 (H) 01/19/2017   CALCIUM 9.4 01/19/2017   MG 1.9 08/08/2015   Lab Results  Component Value Date   INR 0.98 04/20/2012   Lab Results  Component Value Date   CHOL 223 (H) 12/09/2016   HDL 62 12/09/2016   LDLCALC 120 (H) 12/09/2016   TRIG 204 (H) 12/09/2016     GEN- The patient is well appearing, alert and oriented x 3 today.   Head- normocephalic, atraumatic Eyes-  Sclera clear, conjunctiva pink Ears- hearing intact Oropharynx- clear Neck- supple, no JVP Lymph- no cervical lymphadenopathy Lungs- Clear to ausculation bilaterally, normal work of breathing Heart- Regular rate and rhythm, no murmurs, rubs or gallops, PMI not laterally displaced GI- soft, NT, ND, + BS Extremities- no clubbing, cyanosis, or edema MS- no significant deformity or atrophy Skin- no rash or lesion Psych- euthymic mood, full affect Neuro- strength and sensation are intact  EKG- NSR at 75 bpm, pr int 152 ms, qrs int 82 ms, qtc 439 ms Epic records reviewed    Assessment and Plan: 1. Paroxysmal afib Maintaining SR , s/p ablation 6/25 Very little afib burden since ablation Reminded to not miss doses of Eliquis 5 mg bid in the healing period of 3 months following procedure  2. PHT Per Dr. Aundra [name]  F/u with Dr. Aundra [name] as scheduled 8/14, Dr. [name] 10/8   Geroge Baseman. Dalvin Clipper, [facility] [address] [place], [place] [address] [phone]

## 2017-03-09 ENCOUNTER — Encounter: Payer: Self-pay | Admitting: Family Medicine

## 2017-03-09 ENCOUNTER — Ambulatory Visit (INDEPENDENT_AMBULATORY_CARE_PROVIDER_SITE_OTHER): Payer: Medicare Other | Admitting: Family Medicine

## 2017-03-09 VITALS — BP 108/70 | HR 86 | Temp 98.2°F | Ht 60.0 in | Wt 147.0 lb

## 2017-03-09 DIAGNOSIS — M65311 Trigger thumb, right thumb: Secondary | ICD-10-CM

## 2017-03-09 NOTE — Assessment & Plan Note (Signed)
She has triggering of the right thumb. We will try an injection today. - injection  - She can follow-up in 4-6 weeks. Can consider another injection in this area if no improvement at that time.

## 2017-03-09 NOTE — Progress Notes (Signed)
Tiffany [name] - 71 y.o. female MRN [id number]  Date of birth: 10/15/1945  SUBJECTIVE:  Including CC & ROS.  Chief Complaint  Patient presents with  . Thumb pain    Right thumb started 3 weeks ago, patient states she doesnt know how it happend. hurts worse in the morning, feels better with hot water and pain comes and goes throughout the day     Tiffany [name] Is a 71 year old female presenting with right thumb pain. The pain is occurring on the ventral aspect of her right thumb. It catches when she bends her thumb. This is been going on for about 3 weeks now. She denies any repetitive motions. She does not text. She does have to get into her pill box which causes her pain. The pain is localized and moderate nature. She is not taking any medications for this pain. She has no prior history of similar problems. She has no swelling. It is affecting her on a daily basis. She does take Eliquis but does not bleed easily.     Review of Systems  Musculoskeletal: Negative for joint swelling and myalgias.  Skin: Negative for rash.  Hematological: Does not bruise/bleed easily.  otherwise negative   HISTORY: Past Medical, Surgical, Social, and Family History Reviewed & Updated per EMR.   Pertinent Historical Findings include:  Past Medical History:  Diagnosis Date  . Arthritis 04-20-12   Osteoarthritis-right hip  . Atrial fibrillation status post cardioversion, 01/30/15 maintaining SR.  01/31/2015   a. 01/2015 s/p TEE/DCCV;  b. 02/06/2015 recurrent AF noted, amio added;  c. CHA2DS2VASc = 3-->eliquis.  Marland Kitchen BENIGN POSITIONAL VERTIGO   . Cataract   . Complication of anesthesia 04-20-12   some issues with prolonged sedation after anesthesia  . Diverticulosis   . DYSLIPIDEMIA   . Esophageal stricture   . GERD   . HYPERTENSION    a. severe, pt intol of med tx, Pt. has severe "whitecoat" syndrome and refused medical therapy.  . Hypothyroidism 09/29/2014   a. pt did not tolerate synthroid and this was  subsequently discontinued.  . Mild LV dysfunction    a. 01/2015 Echo: EF 45-50%, mild MR, mildly dil LA, mod dil RV with mod to sev reduced fxn, mod-sev TR, PASP 49mmHg.  . Osteopenia   . Raynaud disease   . URINARY INCONTINENCE 04-20-12   occ. with nighttime sleep pattern    Past Surgical History:  Procedure Laterality Date  . ATRIAL FIBRILLATION ABLATION N/A 01/26/2017   Procedure: Atrial Fibrillation Ablation;  Surgeon: Thompson Grayer, MD;  Location: [place] [facility];  Service: Cardiovascular;  Laterality: N/A;  . breast ultrasound Right 09/13/13   There is no sonographic evidence of malignancy. the 6.8TM complicated cyst in (R) breast is consistent with a benign finding. repeat in 1 year  . CARDIAC CATHETERIZATION N/A 08/29/2015   Procedure: Right Heart Cath;  Surgeon: Larey Dresser, MD;  Location: [facility];  Service: Cardiovascular;  Laterality: N/A;  . CARDIOVERSION N/A 01/30/2015   Procedure: CARDIOVERSION;  Surgeon: Sanda Klein, MD;  Location: [facility];  Service: Cardiovascular;  Laterality: N/A;  . CHOLECYSTECTOMY     '90-"sludge"  . NASAL FRACTURE SURGERY  2006  . TEE WITHOUT CARDIOVERSION N/A 01/30/2015   Procedure: TRANSESOPHAGEAL ECHOCARDIOGRAM (TEE);  Surgeon: Sanda Klein, MD;  Location: [facility];  Service: Cardiovascular;  Laterality: N/A;  . TEE WITHOUT CARDIOVERSION N/A 01/25/2017   Procedure: TRANSESOPHAGEAL ECHOCARDIOGRAM (TEE);  Surgeon: Fay Records, MD;  Location: [facility];  Service: Cardiovascular;  Laterality: N/A;  . TOTAL HIP ARTHROPLASTY  04/26/2012   Procedure: TOTAL HIP ARTHROPLASTY ANTERIOR APPROACH;  Surgeon: Mauri Pole, MD;  Location: [facility];  Service: Orthopedics;  Laterality: Right;  . TUBAL LIGATION  1980    Allergies  Allergen Reactions  . Levothyroxine Shortness Of Breath and Other (See Comments)    Exhaustion also - Per pt her this medication could have not been the reason for her symptoms  . Statins Other (See  Comments)    "Pain, weakness and kidney problems"  . Prednisone Other (See Comments)    Severe insomnia  . Penicillins Other (See Comments)    dry mouth Has patient had a PCN reaction causing immediate rash, facial/tongue/throat swelling, SOB or lightheadedness with hypotension: No Has patient had a PCN reaction causing severe rash involving mucus membranes or skin necrosis: No Has patient had a PCN reaction that required hospitalization No Has patient had a PCN reaction occurring within the last 10 years: No If all of the above answers are "NO", then may proceed with Cephalosporin use.     Family History  Problem Relation Age of Onset  . Adopted: Yes  . Other Unknown        patient was adopted     Social History   Social History  . Marital status: Married    Spouse name: N/A  . Number of children: 2  . Years of education: N/A   Occupational History  . Not on file.   Social History Main Topics  . Smoking status: Never Smoker  . Smokeless tobacco: Never Used  . Alcohol use 1.2 oz/week    2 Glasses of wine per week     Comment: several glasses wine weekly  . Drug use: No  . Sexual activity: Yes   Other Topics Concern  . Not on file   Social History Narrative   She enjoys birding.  Lives at home with husband.   Married.   retired Quarry manager, Tax inspector     PHYSICAL EXAM:  VS: BP 108/70 (BP Location: Left Arm, Patient Position: Sitting, Cuff Size: Normal)   Pulse 86   Temp 98.2 F (36.8 C) (Oral)   Ht 5' (1.524 m)   Wt 147 lb (66.7 kg)   SpO2 100%   BMI 28.71 kg/m  Physical Exam Gen: NAD, alert, cooperative with exam, well-appearing ENT: normal lips, normal nasal mucosa,  Eye: normal EOM, normal conjunctiva and lids CV:  no edema, +2 pedal pulses   Resp: no accessory muscle use, non-labored,  Skin: no rashes, no areas of induration  Neuro: normal tone, normal sensation to touch Psych:  normal insight, alert and oriented MSK:    Right hand:  Triggering of the right thumb. No thenar or hyperthenar eminence atrophy. Normal wrist range of motion. Rummel thumb opposition. Normal extension of the thumb. Normal sensation. Neurovascularly intact.    Aspiration/Injection Procedure Note FELIX PRATT 02-05-1946  Procedure: Injection Indications: Right thumb trigger finger and pain   Procedure Details Consent: Risks of procedure as well as the alternatives and risks of each were explained to the (patient/caregiver).  Consent for procedure obtained. Time Out: Verified patient identification, verified procedure, site/side was marked, verified correct patient position, special equipment/implants available, medications/allergies/relevent history reviewed, required imaging and test results available.  Performed.  The area was cleaned with iodine and alcohol swabs.    The right thumb trigger finger was injected using 0.5 cc's of 40  mg Kenalog and 0.5 cc's of 0.5% Marcaine without epinephrine with a 27 1 1/4" needle.  Ultrasound was used. Images were obtained in Long views showing the injection.    A sterile dressing was applied.  Patient did tolerate procedure well.     ASSESSMENT & PLAN:   Trigger finger of right thumb She has triggering of the right thumb. We will try an injection today. - injection  - She can follow-up in 4-6 weeks. Can consider another injection in this area if no improvement at that time.

## 2017-03-09 NOTE — Patient Instructions (Signed)
Thank you for coming in,   Please follow-up with me if you does not have any improvement in 4-6 weeks.   Please feel free to call with any questions or concerns at any time, at [phone]. --Dr. Raeford Razor

## 2017-03-16 ENCOUNTER — Encounter (HOSPITAL_COMMUNITY): Payer: Self-pay | Admitting: Cardiology

## 2017-03-16 ENCOUNTER — Ambulatory Visit (HOSPITAL_COMMUNITY)
Admission: RE | Admit: 2017-03-16 | Discharge: 2017-03-16 | Disposition: A | Discharge status: Home / Self Care | Payer: Medicare Other | Source: Ambulatory Visit | Attending: Cardiology | Admitting: Cardiology

## 2017-03-16 VITALS — BP 116/70 | HR 87 | Wt 147.8 lb

## 2017-03-16 DIAGNOSIS — Z7901 Long term (current) use of anticoagulants: Secondary | ICD-10-CM | POA: Insufficient documentation

## 2017-03-16 DIAGNOSIS — E039 Hypothyroidism, unspecified: Secondary | ICD-10-CM | POA: Insufficient documentation

## 2017-03-16 DIAGNOSIS — I130 Hypertensive heart and chronic kidney disease with heart failure and stage 1 through stage 4 chronic kidney disease, or unspecified chronic kidney disease: Secondary | ICD-10-CM | POA: Diagnosis not present

## 2017-03-16 DIAGNOSIS — I2721 Secondary pulmonary arterial hypertension: Secondary | ICD-10-CM | POA: Diagnosis not present

## 2017-03-16 DIAGNOSIS — G4730 Sleep apnea, unspecified: Secondary | ICD-10-CM | POA: Insufficient documentation

## 2017-03-16 DIAGNOSIS — N183 Chronic kidney disease, stage 3 unspecified: Secondary | ICD-10-CM

## 2017-03-16 DIAGNOSIS — I480 Paroxysmal atrial fibrillation: Secondary | ICD-10-CM

## 2017-03-16 DIAGNOSIS — Z79899 Other long term (current) drug therapy: Secondary | ICD-10-CM | POA: Diagnosis not present

## 2017-03-16 DIAGNOSIS — I5032 Chronic diastolic (congestive) heart failure: Secondary | ICD-10-CM

## 2017-03-16 LAB — BASIC METABOLIC PANEL
Anion gap: 10 (ref 5–15)
BUN: 27 mg/dL — ABNORMAL HIGH (ref 6–20)
CO2: 26 mmol/L (ref 22–32)
Calcium: 9.3 mg/dL (ref 8.9–10.3)
Chloride: 101 mmol/L (ref 101–111)
Creatinine, Ser: 1.78 mg/dL — ABNORMAL HIGH (ref 0.44–1.00)
GFR calc Af Amer: 32 mL/min — ABNORMAL LOW (ref >60)
GFR calc non Af Amer: 27 mL/min — ABNORMAL LOW (ref >60)
Glucose, Bld: 96 mg/dL (ref 65–99)
Potassium: 3.5 mmol/L (ref 3.5–5.1)
Sodium: 137 mmol/L (ref 135–145)

## 2017-03-16 LAB — BRAIN NATRIURETIC PEPTIDE: B Natriuretic Peptide: 87.9 pg/mL (ref 0.0–100.0)

## 2017-03-16 NOTE — Patient Instructions (Signed)
Routine lab work today. Will notify you of abnormal results, otherwise no news is good news!  6 minute walk today.  Follow up 3 months with Dr. [name].  Take all medication as prescribed the day of your appointment. Bring all medications with you to your appointment.  Do the following things EVERYDAY: 1) Weigh yourself in the morning before breakfast. Write it down and keep it in a log. 2) Take your medicines as prescribed 3) Eat low salt foods-Limit salt (sodium) to 2000 mg per day.  4) Stay as active as you can everyday 5) Limit all fluids for the day to less than 2 liters

## 2017-03-17 NOTE — Progress Notes (Signed)
Patient ID: Tiffany [name], female   DOB: [birth year], 71 y.o.   MRN: [id number] PCP: Dr. Sharlet Salina HF Cardiology: Dr. [name]  71 yo with history of paroxysmal atrial fibrillation and diastolic CHF was recently noted to have significant pulmonary hypertension.  She has had dyspnea for about a year now.  It has steadily worsened.  She has history of paroxysmal atrial fibrillation and had an episode in 6/16 requiring cardioversion.  She was started on amiodarone but this was stopped with worsening breathing.  Cardiolite in 9/16 showed on ischemia or infarction.     I had her do a RHC in 1/17.  This showed elevated left and right heart filling pressures but also PAH. After cath, she increased her Lasix from 40 qod to 40 daily.  At a prior appointment, I increased Lasix to 40 mg bid and also started her on Opsumit.  At next appointment, I added Adcirca 20 and then titrated it up to 40 mg daily. She continue to be volume overloaded, so  I increased Lasix to 80 qam/40 qpm. She has seemed to do well on this dose.   Echo was done in 6/17, RV mildly dilated with moderately decreased systolic function and PASP 85 mmHg.    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   She continues on her pulmonary hypertension meds, tolerating current doses. She had an atrial fibrillation ablation in 6/18 and now rarely feels palpitations. Every 2-3 weeks, she will have a palpitations for about 20 minutes and attributes this to atrial fibrillation.  This is much better than before the ablation.  She can walk on flat ground without dyspnea.  She feels overall improved.  Good energy level, goes to the grocery store by herself now.    6 minute walk (2/17): 141 m 6 minute walk (3/17): 182 m 6 minute walk (5/17): 229 m 6 minute walk (10/17): 305 m 6 minute walk (5/18): 256 m 6 minute walk (8/18): [place] (12/16): BNP 1187, ANA 1:640, TSH elevated. Labs (2/17): K 5.4 => 3.9, creatinine 1.78 =>  1.66, LDL 112, BNP 880 Labs (4/17): K 4.5, creatinine 1.76 Labs (5/17): K 4.1, creatinine 1.79 Labs (8/17): K 3.1, creatinine 1.7, BNP 404 Labs (9/17): K 3.9, creatinine 1.8, BNP 512 Labs (2/18): K 3.5, creatinine 1.71, BNP 157 Labs (6/18): K 3.6, creatinine 1.85  PMH:  1. CKD stage III 2. Bradycardia in setting of metoprolol + amiodarone use.  3. Raynauds phenomenon. 4. BPPV 5. OA right hip 6. Hypothyroidism 7. Atrial fibrillation: Paroxysmal.  She was on amiodarone in the past but this was stopped when she became more short of breath.  TEE-guided DCCV in 6/16.  - Atrial fibrillation ablation 6/18.  8. HTN 9. 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 - TEE (6/16) with EF 45-50%, mildly dilated RV with mildly decreased systolic function, PASP 36 mmHg.  - RHC (1/17) with mean RA 9, PA 80/29 mean 52, mean PCWP 27, CI 1.97 Fick/1.8 thermo, PVR 7.6 Fick/8.4 thermo.  - Echo (6/17): EF 60-65%, mild MR, mild RV dilation with moderately decreased systolic function, moderate TR, PASP 85 mmHg.  - Echo (6/18): EF 578-46%, normal diastolic function, normal RV size and systolic function, unable to estimate PA systolic pressure. 10. Pulmonary hypertension: See RHC above.  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  - TEE (6/16) with mildly dilated RV with mildly decreased systolic function. - ANA 2:094, anti-centromere antibody elevated, anti-SCL-70 antibody negative, h/o Raynauds - PFTs (7/16) with minimal obstructive defect but moderately decreased DLCO.  - V/Q scan (2/17) negative for acute or chronic PE.  11. Cardiolite (9/16) with no ischemia/infarction.  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  13. Subclinical hypothyroidism.  14. OSA: Severe on 9/17 sleep study. Now using CPAP.   SH: Married, lives in [place], no smoking, no ETOH.   FH: Adopted.   Daughter has Raynaud's.  ROS: All systems reviewed and negative except as per HPI  Current Outpatient Prescriptions  Medication Sig Dispense Refill  . apixaban (ELIQUIS) 5 MG TABS tablet Take 1 tablet (5 mg total) by mouth 2 (two) times daily. KEEP OV. (Patient taking differently: Take 5 mg by mouth 2 (two) times daily. At breakfast and 9 or 10 pm) 180 tablet 1  . Calcium Carb-Cholecalciferol (CALCIUM 600 + D PO) Take 1 tablet by mouth daily.    Marland Kitchen conjugated estrogens (PREMARIN) vaginal cream Place 1 Applicatorful vaginally once a week. Using 1.5 grams weekly    . diazepam (VALIUM) 5 MG tablet Take 1 tablet (5 mg total) by mouth every 8 (eight) hours as needed. for anxiety 30 tablet 2  . famotidine (PEPCID) 20 MG tablet Take 20 mg by mouth daily at 12 noon.     . potassium chloride SA (KLOR-CON M20) 20 MEQ tablet Take 1 tablet (20 mEq total) by mouth daily. 90 tablet 3  . Riociguat (ADEMPAS) 1 MG TABS Take 1 mg by mouth 3 (three) times daily. (Patient taking differently: Take 2 mg by mouth 3 (three) times daily. At breakfast, 3pm, and 9 or 10pm.  titrating) 90 tablet 11  . Selexipag (UPTRAVI) 600 MCG TABS Take 600 mcg by mouth 2 (two) times daily. (Patient taking differently: Take 600 mcg by mouth 2 (two) times daily. At breakfast and 9 or 10 pm) 60 tablet 11  . acetaminophen (TYLENOL) 325 MG tablet Take 2 tablets (650 mg total) by mouth every 4 (four) hours as needed for headache or mild pain.    . furosemide (LASIX) 40 MG tablet Take 40-80 mg by mouth 2 (two) times daily. Take 80 mg by mouth in the morning and 40mg   mid afternoon    . Macitentan (OPSUMIT) 10 MG TABS Take 1 tablet (10 mg total) by mouth daily. (Patient taking differently: Take 10 mg by mouth daily at 12 noon. ) 30 tablet 11  . Multiple Vitamins-Minerals (CENTRUM SILVER) tablet Take 1 tablet by mouth daily.     Marland Kitchen oxymetazoline (AFRIN) 0.05 % nasal spray Place 1 spray into both nostrils 2 (two) times daily as needed for congestion.     . Probiotic Product (PROBIOTIC-10) CAPS Take 1 capsule by mouth daily.      No current facility-administered medications for this encounter.    BP 116/70 (BP Location: Right Arm, Patient Position: Sitting, Cuff Size: Normal)   Pulse 87   Wt 147 lb 12.8 oz (67 kg)   SpO2 90%   BMI 28.87 kg/m  General: NAD Neck: No JVD, no thyromegaly or thyroid nodule.  Lungs: Clear to auscultation bilaterally with normal respiratory effort. CV: Nondisplaced PMI.  Heart regular S1/S2, no S3/S4, no murmur.  No peripheral edema.  No carotid bruit.  Normal pedal pulses.  Abdomen: Soft, nontender, no hepatosplenomegaly, no distention.  Skin: Intact without lesions or rashes.  Neurologic: Alert and  oriented x 3.  Psych: Normal affect. Extremities: No clubbing or cyanosis.  HEENT: Normal.   Assessment/Plan: 1. Pulmonary hypertension: Patient has pulmonary arterial hypertension.  She appears to have co-existing diastolic LV dysfunction given elevated PCWP on RHC.  PFTs did not show significant obstruction, they only showed moderately decreased DLCO consistent with pulmonary vascular disease. V/Q scan did not show evidence for chronic PE.  PVR 7.6 WU by Fick and 8.4 WU by thermodiluation.  Cardiac index was low, 1.97 Fick/1.8 thermo. 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  Most recent echo in 6/18 showed normal RV but unable to estimate PA systolic pressure. 6 minute walk much improved today.  - She has sleep apnea, now using CPAP.  - Continue Opsumit and Selexipag, unable to increase Selexipag beyond 600 mcg bid due to side effects.  - Continue riociguat, will increase up to 2.5 mg tid at next nurse visit.   2. Chronic diastolic CHF: Elevated PCWP on 1/17 RHC.  Last echo in 6/18 with EF 60-65%.  NYHA class II, volume looks ok on exam.  -  Continue Lasix 80 qam/40 qpm.  BMET/BNP today.    3. Atrial fibrillation: Paroxysmal.  She is in NSR today.  She had atrial fibrillation ablation in 6/18, now with rare short breakthrough episodes. - Continue apixaban.  4. CKD: Stage III. BMET today.   Followup in 3 months.  Loralie Champagne 03/17/2017

## 2017-03-30 ENCOUNTER — Telehealth (HOSPITAL_COMMUNITY): Payer: Self-pay | Admitting: *Deleted

## 2017-03-30 NOTE — Telephone Encounter (Signed)
Pt called stating she has been in afib since Sunday. She has never been in AFIB this long. She was recently out of town but her weight has since returned to normal. Does not know her heart rate or blood pressure. Pt would like to be seen tomorrow. Educated on 3 month healing phase after ablation. Will call and cancel if she returns to normal rhythm.

## 2017-03-31 ENCOUNTER — Inpatient Hospital Stay (HOSPITAL_COMMUNITY): Admission: RE | Admit: 2017-03-31 | Payer: Medicare Other | Source: Ambulatory Visit | Admitting: Nurse Practitioner

## 2017-03-31 ENCOUNTER — Telehealth (HOSPITAL_COMMUNITY): Payer: Self-pay | Admitting: *Deleted

## 2017-03-31 NOTE — Telephone Encounter (Signed)
error 

## 2017-03-31 NOTE — Telephone Encounter (Signed)
Patient returned to NSR overnight. Feeling well. Will call if afib returns.

## 2017-04-03 DIAGNOSIS — I2721 Secondary pulmonary arterial hypertension: Secondary | ICD-10-CM | POA: Diagnosis not present

## 2017-04-13 ENCOUNTER — Encounter (HOSPITAL_COMMUNITY): Payer: Self-pay | Admitting: Cardiology

## 2017-04-13 ENCOUNTER — Ambulatory Visit (HOSPITAL_COMMUNITY)
Admission: RE | Admit: 2017-04-13 | Discharge: 2017-04-13 | Disposition: A | Discharge status: Home / Self Care | Payer: Medicare Other | Source: Ambulatory Visit | Attending: Cardiology | Admitting: Cardiology

## 2017-04-13 VITALS — BP 118/72 | HR 97 | Wt 148.1 lb

## 2017-04-13 DIAGNOSIS — E039 Hypothyroidism, unspecified: Secondary | ICD-10-CM | POA: Diagnosis not present

## 2017-04-13 DIAGNOSIS — F419 Anxiety disorder, unspecified: Secondary | ICD-10-CM | POA: Insufficient documentation

## 2017-04-13 DIAGNOSIS — M341 CR(E)ST syndrome: Secondary | ICD-10-CM | POA: Diagnosis not present

## 2017-04-13 DIAGNOSIS — N183 Chronic kidney disease, stage 3 unspecified: Secondary | ICD-10-CM

## 2017-04-13 DIAGNOSIS — G4733 Obstructive sleep apnea (adult) (pediatric): Secondary | ICD-10-CM | POA: Diagnosis not present

## 2017-04-13 DIAGNOSIS — I130 Hypertensive heart and chronic kidney disease with heart failure and stage 1 through stage 4 chronic kidney disease, or unspecified chronic kidney disease: Secondary | ICD-10-CM | POA: Insufficient documentation

## 2017-04-13 DIAGNOSIS — I2721 Secondary pulmonary arterial hypertension: Secondary | ICD-10-CM | POA: Diagnosis not present

## 2017-04-13 DIAGNOSIS — H8110 Benign paroxysmal vertigo, unspecified ear: Secondary | ICD-10-CM | POA: Diagnosis not present

## 2017-04-13 DIAGNOSIS — Z9889 Other specified postprocedural states: Secondary | ICD-10-CM | POA: Diagnosis not present

## 2017-04-13 DIAGNOSIS — I272 Pulmonary hypertension, unspecified: Secondary | ICD-10-CM | POA: Diagnosis not present

## 2017-04-13 DIAGNOSIS — M1611 Unilateral primary osteoarthritis, right hip: Secondary | ICD-10-CM | POA: Diagnosis not present

## 2017-04-13 DIAGNOSIS — I7300 Raynaud's syndrome without gangrene: Secondary | ICD-10-CM | POA: Insufficient documentation

## 2017-04-13 DIAGNOSIS — I5032 Chronic diastolic (congestive) heart failure: Secondary | ICD-10-CM

## 2017-04-13 DIAGNOSIS — I480 Paroxysmal atrial fibrillation: Secondary | ICD-10-CM | POA: Insufficient documentation

## 2017-04-13 LAB — BASIC METABOLIC PANEL
Anion gap: 10 (ref 5–15)
BUN: 27 mg/dL — ABNORMAL HIGH (ref 6–20)
CO2: 26 mmol/L (ref 22–32)
Calcium: 9.4 mg/dL (ref 8.9–10.3)
Chloride: 102 mmol/L (ref 101–111)
Creatinine, Ser: 1.7 mg/dL — ABNORMAL HIGH (ref 0.44–1.00)
GFR calc Af Amer: 34 mL/min — ABNORMAL LOW (ref >60)
GFR calc non Af Amer: 29 mL/min — ABNORMAL LOW (ref >60)
Glucose, Bld: 110 mg/dL — ABNORMAL HIGH (ref 65–99)
Potassium: 3.4 mmol/L — ABNORMAL LOW (ref 3.5–5.1)
Sodium: 138 mmol/L (ref 135–145)

## 2017-04-13 LAB — BRAIN NATRIURETIC PEPTIDE: B Natriuretic Peptide: 112.9 pg/mL — ABNORMAL HIGH (ref 0.0–100.0)

## 2017-04-13 LAB — MAGNESIUM: Magnesium: 2.5 mg/dL — ABNORMAL HIGH (ref 1.7–2.4)

## 2017-04-13 MED ORDER — RIOCIGUAT 1 MG PO TABS
2.0000 mg | ORAL_TABLET | Freq: Three times a day (TID) | ORAL | 11 refills | Status: DC
Start: 2017-04-13 — End: 2017-05-10

## 2017-04-13 NOTE — Patient Instructions (Addendum)
Stop Afrin nasal spray  Start Flonase nasay spray  Increase Riociguat to 2 mg TID   Labs drawn today  Your physician recommends that you schedule a follow-up appointment in: 2 months

## 2017-04-14 ENCOUNTER — Telehealth (HOSPITAL_COMMUNITY): Payer: Self-pay

## 2017-04-14 ENCOUNTER — Other Ambulatory Visit: Payer: Self-pay | Admitting: Internal Medicine

## 2017-04-14 MED ORDER — POTASSIUM CHLORIDE CRYS ER 20 MEQ PO TBCR: 40.0000 meq | EXTENDED_RELEASE_TABLET | Freq: Every day | ORAL | 3 refills | Status: DC

## 2017-04-14 NOTE — Telephone Encounter (Signed)
Pt states she takes a Multivitamin (Centrum) it has 50 mg of Mg. She is aware and agreeable to increase K+ to 40 meq (2 tabs daily). Med orders changed.

## 2017-04-14 NOTE — Telephone Encounter (Signed)
Faxed to [facility] on [address]

## 2017-04-14 NOTE — Progress Notes (Signed)
Patient ID: [name], female   DOB: 1945-09-23, 71 y.o.   MRN: [id number] PCP: Dr. Sharlet Salina HF Cardiology: Dr. [name]  71 yo with history of paroxysmal atrial fibrillation and diastolic CHF was noted to have significant pulmonary hypertension and exertional dyspnea.  She has history of paroxysmal atrial fibrillation and had an episode in 6/16 requiring cardioversion.  She was started on amiodarone but this was stopped with worsening breathing.  Cardiolite in 9/16 showed on ischemia or infarction.     I had her do a RHC in 1/17.  This showed elevated left and right heart filling pressures but also PAH. After cath, she increased her Lasix from 40 qod to 40 daily.  At a prior appointment, I increased Lasix to 40 mg bid and also started her on Opsumit.  At next appointment, I added Adcirca 20 and then titrated it up to 40 mg daily. She continue to be volume overloaded, so  I increased Lasix to 80 qam/40 qpm. She has seemed to do well on this dose.   Echo was done in 6/17, RV mildly dilated with moderately decreased systolic function and PASP 85 mmHg.    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   She had an atrial fibrillation ablation in 6/18.   71 She returns for followup of atrial fibrillation and pulmonary hypertension today.  71 At last appointment (post-ablation), she was doing great with rare palpitations.  71 Today is very different.  She is very anxious and says that she feels "awful."  She had an episode of atrial fibrillation lasting 60 hrs in late August.  She is having frequent breakthrough atrial fibrillation lasting from 10 minutes to a few hours.  She is symptomatic with the episodes, significant palpitations.  Between episodes, she is worried about when the next episode will occur.  She is not sleeping well at all.  She says that her face and sinuses have felt tight and ears clogged for the last several weeks.  She saw her ENT today, he did not think that she had  sinusitis.  He noted that her face appeared flushed and was concerned for an allergic reaction.  The only medication change recently has been increasing Adempas from 2 tid to 2.5 tid.  She is still walking for 73 minutes at a time at home, but now feels subjectively more short of breath with exercise.  She is short of breath walking up stairs.  No lightheadedness or syncope.  No chest pain. Weight is stable.  She has been taking Afrin daily for the congestion.   ECG (personally reviewed): NSR with PVC, QTc 451 msec.  6 minute walk (2/17): 141 m 6 minute walk (3/17): 182 m 6 minute walk (5/17): 229 m 6 minute walk (10/17): 305 m 6 minute walk (5/18): 256 m 6 minute walk (8/18): [place] (12/16): BNP 1187, ANA 1:640, TSH elevated. Labs (2/17): K 5.4 => 3.9, creatinine 1.78 => 1.66, LDL 112, BNP 880 Labs (4/17): K 4.5, creatinine 1.76 Labs (5/17): K 4.1, creatinine 1.79 Labs (8/17): K 3.1, creatinine 1.7, BNP 404 Labs (9/17): K 3.9, creatinine 1.8, BNP 512 Labs (2/18): K 3.5, creatinine 1.71, BNP 157 Labs (6/18): K 3.6, creatinine 1.85 Labs (8/18): K 3.5, creatinine 1.78, BNP 88  PMH:  1. CKD stage III 2. Bradycardia in setting of metoprolol + amiodarone use.  3. Raynauds phenomenon. 4. BPPV 5. OA right hip 6. Hypothyroidism 7. Atrial fibrillation: Paroxysmal.  She was  on amiodarone in the past but this was stopped when she became more short of breath.  TEE-guided DCCV in 6/16.  - Atrial fibrillation ablation 6/18.  8. HTN 9. 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 - TEE (6/16) with EF 45-50%, mildly dilated RV with mildly decreased systolic function, PASP 36 mmHg.  - RHC (1/17) with mean RA 9, PA 80/29 mean 52, mean PCWP 27, CI 1.97 Fick/1.8 thermo, PVR 7.6 Fick/8.4 thermo.  - Echo (6/17): EF 60-65%, mild MR, mild RV dilation with  moderately decreased systolic function, moderate TR, PASP 85 mmHg.  - Echo (6/18): EF 132-44%, normal diastolic function, normal RV size and systolic function, unable to estimate PA systolic pressure. 10. Pulmonary hypertension: See RHC above.  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  - TEE (6/16) with mildly dilated RV with mildly decreased systolic function. - ANA 0:102, anti-centromere antibody elevated, anti-SCL-70 antibody negative, h/o Raynauds - PFTs (7/16) with minimal obstructive defect but moderately decreased DLCO.  - V/Q scan (2/17) negative for acute or chronic PE.  11. Cardiolite (9/16) with no ischemia/infarction.  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  13. Subclinical hypothyroidism.  14. OSA: Severe on 9/17 sleep study. Now using CPAP.   SH: Married, lives in [place], no smoking, no ETOH.   FH: Adopted.  Daughter has Raynaud's.  ROS: All systems reviewed and negative except as per HPI  Current Outpatient Prescriptions  Medication Sig Dispense Refill  . acetaminophen (TYLENOL) 325 MG tablet Take 2 tablets (650 mg total) by mouth every 4 (four) hours as needed for headache or mild pain.    Marland Kitchen apixaban (ELIQUIS) 5 MG TABS tablet Take 1 tablet (5 mg total) by mouth 2 (two) times daily. KEEP OV. (Patient taking differently: Take 5 mg by mouth 2 (two) times daily. At breakfast and 9 or 10 pm) 180 tablet 1  . Calcium Carb-Cholecalciferol (CALCIUM 600 + D PO) Take 1 tablet by mouth daily.    Marland Kitchen conjugated estrogens (PREMARIN) vaginal cream Place 1 Applicatorful vaginally once a week. Using 1.5 grams weekly    . diazepam (VALIUM) 5 MG tablet Take 1 tablet (5 mg total) by mouth every 8 (eight) hours as needed. for anxiety 30 tablet 2  . famotidine (PEPCID) 20 MG tablet Take 20 mg by mouth daily at 12 noon.     . furosemide (LASIX) 40 MG tablet Take 40-80 mg by mouth 2 (two) times daily. Take 80 mg by mouth in the morning and 40mg   mid afternoon    . Macitentan (OPSUMIT) 10 MG  TABS Take 1 tablet (10 mg total) by mouth daily. (Patient taking differently: Take 10 mg by mouth daily at 12 noon. ) 30 tablet 11  . Multiple Vitamins-Minerals (CENTRUM SILVER) tablet Take 1 tablet by mouth daily.     . potassium chloride SA (KLOR-CON M20) 20 MEQ tablet Take 1 tablet (20 mEq total) by mouth daily. 90 tablet 3  . Probiotic Product (PROBIOTIC-10) CAPS Take 1 capsule by mouth daily.     . Riociguat (ADEMPAS) 1 MG TABS Take 2 mg by mouth 3 (three) times daily. 90 tablet 11  . Selexipag (UPTRAVI) 600 MCG TABS Take 600 mcg by mouth 2 (two) times daily. (Patient taking differently: Take 600 mcg by mouth 2 (two) times daily. At breakfast and 9 or 10 pm) 60 tablet  11   No current facility-administered medications for this encounter.    BP 118/72   Pulse 97   Wt 148 lb 1.9 oz (67.2 kg)   SpO2 94%   BMI 28.93 kg/m   General: NAD Neck: No JVD, no thyromegaly or thyroid nodule.  Lungs: Clear to auscultation bilaterally with normal respiratory effort. CV: Nondisplaced PMI.  Heart regular S1/S2, no S3/S4, no murmur.  No peripheral edema.  No carotid bruit.  Normal pedal pulses.  Abdomen: Soft, nontender, no hepatosplenomegaly, no distention.  Skin: Mild diffuse facial redness.   Neurologic: Alert and oriented x 3.  Psych: Normal affect. Extremities: No clubbing or cyanosis.  HEENT: Normal.   Assessment/Plan: 1. Pulmonary hypertension: Patient has pulmonary arterial hypertension.  She appears to have co-existing diastolic LV dysfunction given elevated PCWP on prior RHC.  PFTs did not show significant obstruction, they only showed moderately decreased DLCO consistent with pulmonary vascular disease. V/Q scan did not show evidence for chronic PE.  PVR 7.6 WU by Fick and 8.4 WU by thermodiluation.  Cardiac index was low, 1.97 Fick/1.8 thermo. 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  ANA 1:640 with elevated anti-centromere antibody and Raynaud's phenomenon, suspect PAH related to rheumatological  disease, incomplete CREST syndrome per Dr Elmon Else last note.  Most recent echo in 6/18 showed normal RV but unable to estimate PA systolic pressure. Her 6 minute walk has improved over time.  Today, she has upper respiratory congestion and flushing.  ENT has seen her, does not have sinusitis => concerned for allergic reaction.  - Recent increase in Adempas (though Adempas does not typically cause congestion) => will decrease Adempas back to 2 mg tid.  - She has sleep apnea, now using CPAP.  - Continue Opsumit and Selexipag, unable to increase Selexipag beyond 600 mcg bid due to side effects.  2. Chronic diastolic CHF: Elevated PCWP on 1/17 RHC.  Last echo in 6/18 with EF 60-65%.  She feels symptomatically worse, NYHA class III.  However, on exam she does not appear volume overloaded and weight is stable.  Symptoms may be related to increased frequency of atrial fibrillation.  - Continue Lasix 80 qam/40 qpm.  BMET/BNP today.    3. Atrial fibrillation: Paroxysmal.  She is in NSR today.  She had atrial fibrillation ablation in 6/18.  Initially, she seemed to do well.  However, more recently she has been having frequent breakthrough episodes.  Breakthrough episodes tend to be highly symptomatic with tachypalpitations.  She is also significantly affected by anxiety between episodes as she waits for the next episode.  She is not sleeping (this does not help) and she is using Afrin for congestion.  - Stop Afrin (stimulant).  - Continue apixaban.  - Long discussion about what to do.  Still < 3 months out from ablation, so there is a chance she may see some imrpovement (however, has been nearly 3 months).  She has pulmonary hypertension, so chance of ablation success is likely lower.  We discussed Tikosyn use.  This would be possible, but she would need a lower dose due to CKD (probably 125 mcg bid).  We discussed how the atrial fibrillation itself is not imminently dangerous to her as long as she is  anticoagulated, she seemed to feel better.  She cannot tolerate amiodarone and does not want to re-try a beta blocker as she says her HR got too low in the past on a beta blocker.  We ended up deciding to wait a bit  longer and have her followup with Dr. [name] in early October.  She will call us if she wants to start Tikosyn and I will arrange.  I suspect a redo ablation in her situation may not be overly helpful.  4. CKD: Stage III. BMET today.   Followup in 2 months.  Loralie Champagne 04/14/2017

## 2017-04-22 ENCOUNTER — Other Ambulatory Visit (HOSPITAL_COMMUNITY): Payer: Self-pay | Admitting: Cardiology

## 2017-05-10 ENCOUNTER — Ambulatory Visit (INDEPENDENT_AMBULATORY_CARE_PROVIDER_SITE_OTHER): Payer: Medicare Other | Admitting: Internal Medicine

## 2017-05-10 ENCOUNTER — Encounter: Payer: Self-pay | Admitting: Internal Medicine

## 2017-05-10 VITALS — BP 116/68 | HR 76 | Ht 60.0 in | Wt 150.4 lb

## 2017-05-10 DIAGNOSIS — I272 Pulmonary hypertension, unspecified: Secondary | ICD-10-CM | POA: Diagnosis not present

## 2017-05-10 DIAGNOSIS — I480 Paroxysmal atrial fibrillation: Secondary | ICD-10-CM

## 2017-05-10 NOTE — Progress Notes (Signed)
PCP: [name], MD Primary Cardiologist: Dr Brantley Persons is a 71 y.o. female who presents today for routine electrophysiology followup.  Since his recent afib ablation, the patient reports doing very well.  she denies procedure related complications and is pleased with the results of the procedure.  She did have some ERAF early post ablation.  Today, she denies symptoms of palpitations, chest pain, shortness of breath,  lower extremity edema, dizziness, presyncope, or syncope.  The patient is otherwise without complaint today.   Past Medical History:  Diagnosis Date  . Arthritis 04-20-12   Osteoarthritis-right hip  . Atrial fibrillation status post cardioversion, 01/30/15 maintaining SR.  01/31/2015   a. 01/2015 s/p TEE/DCCV;  b. 02/06/2015 recurrent AF noted, amio added;  c. CHA2DS2VASc = 3-->eliquis.  Marland Kitchen BENIGN POSITIONAL VERTIGO   . Cataract   . Complication of anesthesia 04-20-12   some issues with prolonged sedation after anesthesia  . Diverticulosis   . DYSLIPIDEMIA   . Esophageal stricture   . GERD   . HYPERTENSION    a. severe, pt intol of med tx, Pt. has severe "whitecoat" syndrome and refused medical therapy.  . Hypothyroidism 09/29/2014   a. pt did not tolerate synthroid and this was subsequently discontinued.  . Mild LV dysfunction    a. 01/2015 Echo: EF 45-50%, mild MR, mildly dil LA, mod dil RV with mod to sev reduced fxn, mod-sev TR, PASP 63mmHg.  . Osteopenia   . Raynaud disease   . URINARY INCONTINENCE 04-20-12   occ. with nighttime sleep pattern   Past Surgical History:  Procedure Laterality Date  . ATRIAL FIBRILLATION ABLATION N/A 01/26/2017   Procedure: Atrial Fibrillation Ablation;  Surgeon: Thompson Grayer, MD;  Location: [facility];  Service: Cardiovascular;  Laterality: N/A;  . breast ultrasound Right 09/13/13   There is no sonographic evidence of malignancy. the 4.1PF complicated cyst in (R) breast is consistent with a benign finding.  repeat in 1 year  . CARDIAC CATHETERIZATION N/A 08/29/2015   Procedure: Right Heart Cath;  Surgeon: Larey Dresser, MD;  Location: [facility];  Service: Cardiovascular;  Laterality: N/A;  . CARDIOVERSION N/A 01/30/2015   Procedure: CARDIOVERSION;  Surgeon: Sanda Klein, MD;  Location: [facility];  Service: Cardiovascular;  Laterality: N/A;  . CHOLECYSTECTOMY     '90-"sludge"  . NASAL FRACTURE SURGERY  2006  . TEE WITHOUT CARDIOVERSION N/A 01/30/2015   Procedure: TRANSESOPHAGEAL ECHOCARDIOGRAM (TEE);  Surgeon: Sanda Klein, MD;  Location: [facility] ENDOSCOPY;  Service: Cardiovascular;  Laterality: N/A;  . TEE WITHOUT CARDIOVERSION N/A 01/25/2017   Procedure: TRANSESOPHAGEAL ECHOCARDIOGRAM (TEE);  Surgeon: Fay Records, MD;  Location: [facility] ENDOSCOPY;  Service: Cardiovascular;  Laterality: N/A;  . TOTAL HIP ARTHROPLASTY  04/26/2012   Procedure: TOTAL HIP ARTHROPLASTY ANTERIOR APPROACH;  Surgeon: Mauri Pole, MD;  Location: [facility] ORS;  Service: Orthopedics;  Laterality: Right;  . TUBAL LIGATION  1980    ROS- all systems are personally reviewed and negatives except as per HPI above  Current Outpatient Prescriptions  Medication Sig Dispense Refill  . acetaminophen (TYLENOL) 325 MG tablet Take 2 tablets (650 mg total) by mouth every 4 (four) hours as needed for headache or mild pain.    Marland Kitchen ADEMPAS 2 MG TABS Take 2 mg by mouth 3 (three) times daily.    Marland Kitchen apixaban (ELIQUIS) 5 MG TABS tablet Take 1 tablet (5 mg total) by mouth 2 (two) times daily. KEEP OV. (Patient taking differently:  Take 5 mg by mouth 2 (two) times daily. At breakfast and 9 or 10 pm) 180 tablet 1  . Calcium Carb-Cholecalciferol (CALCIUM 600 + D PO) Take 1 tablet by mouth daily.    Marland Kitchen conjugated estrogens (PREMARIN) vaginal cream Place 1 Applicatorful vaginally once a week. Using 1.5 grams weekly    . diazepam (VALIUM) 5 MG tablet TAKE 1 TABLET EVERY 8 HOURS AS NEEDED FOR ANXIETY 30 tablet 2  . famotidine (PEPCID) 20 MG tablet Take  20 mg by mouth daily at 12 noon.     . furosemide (LASIX) 40 MG tablet TAKE 2 TABLETS BY MOUTH EVERY MORNING AND 1 TABLET EVERY EVENING 270 tablet 3  . Macitentan (OPSUMIT) 10 MG TABS Take 1 tablet (10 mg total) by mouth daily. (Patient taking differently: Take 10 mg by mouth daily at 12 noon. ) 30 tablet 11  . potassium chloride SA (KLOR-CON M20) 20 MEQ tablet Take 2 tablets (40 mEq total) by mouth daily. 60 tablet 3  . Probiotic Product (PROBIOTIC-10) CAPS Take 1 capsule by mouth daily.     . Selexipag (UPTRAVI) 600 MCG TABS Take 600 mcg by mouth 2 (two) times daily. (Patient taking differently: Take 600 mcg by mouth 2 (two) times daily. At breakfast and 9 or 10 pm) 60 tablet 11   No current facility-administered medications for this visit.     Physical Exam: Vitals:   05/10/17 1114  BP: 116/68  Pulse: 76  SpO2: 97%  Weight: 150 lb 6.4 oz (68.2 kg)  Height: 5' (1.524 m)    GEN- The patient is well appearing, alert and oriented x 3 today.   Head- normocephalic, atraumatic Eyes-  Sclera clear, conjunctiva pink Ears- hearing intact Oropharynx- clear Lungs- Clear to ausculation bilaterally, normal work of breathing Heart- Regular rate and rhythm, no murmurs, rubs or gallops, PMI not laterally displaced GI- soft, NT, ND, + BS Extremities- no clubbing, cyanosis, or edema  EKG tracing ordered today is personally reviewed and shows sinus rhythm  Assessment and Plan:  1. Persistent/ paroxysmal atrial fibrillation Doing well s/p ablation. She has had some ERAF but  Is clearly much better. chads2vasc score is 3.  Continue on eliquis  2. [place] Per Dr [name]  3. OSA Compliance with CPAP encouraged  4. HTN Stable No change required today  Return to see me in 3 months  Thompson Grayer MD, [facility] 05/10/2017 11:42 AM

## 2017-05-10 NOTE — Patient Instructions (Signed)

## 2017-05-11 ENCOUNTER — Telehealth (HOSPITAL_COMMUNITY): Payer: Self-pay | Admitting: *Deleted

## 2017-05-11 MED ORDER — SELEXIPAG 600 MCG PO TABS: 600.0000 ug | ORAL_TABLET | Freq: Two times a day (BID) | ORAL | 11 refills | Status: DC

## 2017-05-11 NOTE — Telephone Encounter (Signed)
Entered in error

## 2017-05-28 ENCOUNTER — Other Ambulatory Visit: Payer: Self-pay | Admitting: Cardiology

## 2017-05-28 ENCOUNTER — Other Ambulatory Visit (HOSPITAL_COMMUNITY): Payer: Self-pay | Admitting: Cardiology

## 2017-06-15 ENCOUNTER — Emergency Department (HOSPITAL_COMMUNITY)
Admission: EM | Admit: 2017-06-15 | Discharge: 2017-06-15 | Disposition: A | Discharge status: Home / Self Care | Payer: Medicare Other | Attending: Emergency Medicine | Admitting: Emergency Medicine

## 2017-06-15 ENCOUNTER — Encounter (HOSPITAL_COMMUNITY): Payer: Self-pay | Admitting: Emergency Medicine

## 2017-06-15 ENCOUNTER — Emergency Department (HOSPITAL_COMMUNITY): Payer: Medicare Other

## 2017-06-15 DIAGNOSIS — Z7901 Long term (current) use of anticoagulants: Secondary | ICD-10-CM | POA: Diagnosis not present

## 2017-06-15 DIAGNOSIS — I10 Essential (primary) hypertension: Secondary | ICD-10-CM | POA: Diagnosis not present

## 2017-06-15 DIAGNOSIS — Z79899 Other long term (current) drug therapy: Secondary | ICD-10-CM | POA: Insufficient documentation

## 2017-06-15 DIAGNOSIS — E039 Hypothyroidism, unspecified: Secondary | ICD-10-CM | POA: Diagnosis not present

## 2017-06-15 DIAGNOSIS — R55 Syncope and collapse: Secondary | ICD-10-CM | POA: Insufficient documentation

## 2017-06-15 DIAGNOSIS — R42 Dizziness and giddiness: Secondary | ICD-10-CM | POA: Diagnosis not present

## 2017-06-15 LAB — CBC
HCT: 38.7 % (ref 36.0–46.0)
Hemoglobin: 12.7 g/dL (ref 12.0–15.0)
MCH: 30 pg (ref 26.0–34.0)
MCHC: 32.8 g/dL (ref 30.0–36.0)
MCV: 91.5 fL (ref 78.0–100.0)
Platelets: 225 10*3/uL (ref 150–400)
RBC: 4.23 MIL/uL (ref 3.87–5.11)
RDW: 15 % (ref 11.5–15.5)
WBC: 4.8 10*3/uL (ref 4.0–10.5)

## 2017-06-15 LAB — BASIC METABOLIC PANEL
Anion gap: 11 (ref 5–15)
BUN: 29 mg/dL — ABNORMAL HIGH (ref 6–20)
CO2: 23 mmol/L (ref 22–32)
Calcium: 9.1 mg/dL (ref 8.9–10.3)
Chloride: 104 mmol/L (ref 101–111)
Creatinine, Ser: 1.84 mg/dL — ABNORMAL HIGH (ref 0.44–1.00)
GFR calc Af Amer: 30 mL/min — ABNORMAL LOW (ref >60)
GFR calc non Af Amer: 26 mL/min — ABNORMAL LOW (ref >60)
Glucose, Bld: 89 mg/dL (ref 65–99)
Potassium: 3.6 mmol/L (ref 3.5–5.1)
Sodium: 138 mmol/L (ref 135–145)

## 2017-06-15 LAB — I-STAT TROPONIN, ED: Troponin i, poc: 0 ng/mL (ref 0.00–0.08)

## 2017-06-15 NOTE — Discharge Instructions (Addendum)
As discussed, your EKG and labs were unremarkable today. You may be experiencing premature ventricular contractions which would explain the sensation of skipped beats. EKG did not show A. fib today. Make sure that you stay well-hydrated. Follow up with your cardiologist on Monday.  Return sooner if he experienced difficulty breathing, shortness of breath, chest pain, or dizziness, weakness, severe headache, visual disturbances or other new concerning symptoms in the meantime.  * Your chest xray showed an area of density in the right upper lobe and it is recommended that you follow up with your primary care provider to have a CT scan scheduled to further define possible nodule.

## 2017-06-15 NOTE — ED Provider Notes (Signed)
[facility] EMERGENCY DEPARTMENT Provider Note   CSN: [id number] Arrival date & time: 06/15/17  1110     History   Chief Complaint Chief Complaint  Patient presents with  . Atrial Fibrillation    HPI Tiffany [name] is a 71 y.o. female with history significant for A. fib status post cardioversion June presenting with an episode of dizziness and "feeling faint" while taking a shower this am. She reports taking her blood pressure which was 88/57 and her heart rate was in the 140s she then took it again and states that the blood pressure was fine and her heart rate was 75. She had an appointment with ENT and was seen in office and told that her ears looked okay except for the right side which was a little bit cloudy and seemed to have a little bit of effusion. She reports episodes in the past where she experience severe dizziness from a sinus infection. She has been congested and fighting a little bit of a cold recently. She explains that she was in ENT office and her blood pressure and took her pulse were taken and provider got alarmed and called the heart clinic who told them to send her to the emergency department. She does not recall what finding was alarming. Lately she has been experiencing skipped beats and an uncomfortable fluttering feeling intermittently but reports that he has not been anything like when she used to go in A. fib. She knew exactly when she was in A. fib and when she went out of A. fib in the past.  She has had an ablation 01/26/2017. After getting here she has been feeling better and did not experience any more dizziness. She denies any chest pain, shortness of breath, dizziness, visual disturbances, weakness, numbness, nausea, vomiting or other symptoms.  HPI  Past Medical History:  Diagnosis Date  . Arthritis 04-20-12   Osteoarthritis-right hip  . Atrial fibrillation status post cardioversion, 01/30/15 maintaining SR.  01/31/2015   a. 01/2015 s/p  TEE/DCCV;  b. 02/06/2015 recurrent AF noted, amio added;  c. CHA2DS2VASc = 3-->eliquis.  Marland Kitchen BENIGN POSITIONAL VERTIGO   . Cataract   . Complication of anesthesia 04-20-12   some issues with prolonged sedation after anesthesia  . Diverticulosis   . DYSLIPIDEMIA   . Esophageal stricture   . GERD   . HYPERTENSION    a. severe, pt intol of med tx, Pt. has severe "whitecoat" syndrome and refused medical therapy.  . Hypothyroidism 09/29/2014   a. pt did not tolerate synthroid and this was subsequently discontinued.  . Mild LV dysfunction    a. 01/2015 Echo: EF 45-50%, mild MR, mildly dil LA, mod dil RV with mod to sev reduced fxn, mod-sev TR, PASP 48mmHg.  . Osteopenia   . Raynaud disease   . URINARY INCONTINENCE 04-20-12   occ. with nighttime sleep pattern    Patient Active Problem List   Diagnosis Date Noted  . Trigger finger of right thumb 03/09/2017  . Persistent atrial fibrillation ([place]) 01/26/2017  . Routine general medical examination at a health care facility 10/03/2015  . Chronic diastolic CHF (congestive heart failure) ([place]) 09/19/2015  . Pulmonary hypertension ([place]) 09/19/2015  . Bradycardia   . Near syncope 04/14/2015  . HTN (hypertension) 04/14/2015  . Hyperglycemia 02/13/2015  . Atrial fibrillation status post cardioversion, 01/30/15 maintaining SR.  01/31/2015  . Raynaud's disease 01/29/2015  . CKD (chronic kidney disease) stage 4, GFR 15-29 ml/min (HCC) 01/29/2015  . Hypothyroidism  09/29/2014  . Osteopenia   . Anxiety   . BENIGN POSITIONAL VERTIGO 09/17/2010  . Osteoarthritis of right hip 01/13/2010  . Hyperlipidemia 07/01/2009  . Obesity-BMI 31 07/01/2009  . GERD 07/01/2009  . URINARY INCONTINENCE 07/01/2009    Past Surgical History:  Procedure Laterality Date  . breast ultrasound Right 09/13/13   There is no sonographic evidence of malignancy. the 0.6CB complicated cyst in (R) breast is consistent with a benign finding. repeat in 1 year  . CHOLECYSTECTOMY      '90-"sludge"  . NASAL FRACTURE SURGERY  2006  . TUBAL LIGATION  1980    OB History    No data available       Home Medications    Prior to Admission medications   Medication Sig Start Date End Date Taking? Authorizing Provider  acetaminophen (TYLENOL) 325 MG tablet Take 2 tablets (650 mg total) by mouth every 4 (four) hours as needed for headache or mild pain. 01/29/15   Kilroy, Luke K, PA-C  ADEMPAS 2 MG TABS Take 2 mg by mouth 3 (three) times daily. 05/07/17   [provider]  apixaban (ELIQUIS) 5 MG TABS tablet Take 1 tablet (5 mg total) by mouth 2 (two) times daily. 05/28/17   Minus Breeding, MD  Calcium Carb-Cholecalciferol (CALCIUM 600 + D PO) Take 1 tablet by mouth daily.    [provider]  conjugated estrogens (PREMARIN) vaginal cream Place 1 Applicatorful vaginally once a week. Using 1.5 grams weekly    [provider]  diazepam (VALIUM) 5 MG tablet TAKE 1 TABLET EVERY 8 HOURS AS NEEDED FOR ANXIETY 04/14/17   [name], MD  famotidine (PEPCID) 20 MG tablet Take 20 mg by mouth daily at 12 noon.     [provider]  furosemide (LASIX) 40 MG tablet TAKE 2 TABLETS BY MOUTH EVERY MORNING AND 1 TABLET EVERY EVENING 04/23/17   Bensimhon, Shaune Pascal, MD  KLOR-CON M20 20 MEQ tablet TAKE 1 TABLET (20 MEQ TOTAL) BY MOUTH DAILY. 05/28/17   Bensimhon, Shaune Pascal, MD  Macitentan (OPSUMIT) 10 MG TABS Take 1 tablet (10 mg total) by mouth daily. Patient taking differently: Take 10 mg by mouth daily at 12 noon.  09/10/16   Larey Dresser, MD  potassium chloride SA (KLOR-CON M20) 20 MEQ tablet Take 2 tablets (40 mEq total) by mouth daily. 04/14/17   Larey Dresser, MD  Probiotic Product (PROBIOTIC-10) CAPS Take 1 capsule by mouth daily.     [provider]  Selexipag (UPTRAVI) 600 MCG TABS Take 600 mcg by mouth 2 (two) times daily. 05/11/17   Larey Dresser, MD    Family History Family History  Adopted: Yes  Problem Relation Age of Onset    . Other Unknown        patient was adopted    Social History Social History   Tobacco Use  . Smoking status: Never Smoker  . Smokeless tobacco: Never Used  Substance Use Topics  . Alcohol use: Yes    Alcohol/week: 1.2 oz    Types: 2 Glasses of wine per week    Comment: several glasses wine weekly  . Drug use: No     Allergies   Levothyroxine; Statins; Prednisone; and Penicillins   Review of Systems Review of Systems  Constitutional: Negative for chills and fever.  HENT: Positive for congestion.   Eyes: Negative for photophobia, pain, redness and visual disturbance.  Respiratory: Negative for cough, choking, chest tightness, shortness of breath,  wheezing and stridor.   Cardiovascular: Positive for palpitations. Negative for chest pain.  Gastrointestinal: Negative for abdominal distention, abdominal pain, nausea and vomiting.  Genitourinary: Negative for difficulty urinating, dysuria and hematuria.  Musculoskeletal: Negative for arthralgias, back pain, gait problem, joint swelling, myalgias, neck pain and neck stiffness.  Skin: Negative for color change, pallor and rash.  Neurological: Positive for dizziness and light-headedness. Negative for tremors, seizures, syncope, facial asymmetry, speech difficulty, weakness, numbness and headaches.     Physical Exam Updated Vital Signs BP 115/66   Pulse 77   Temp 97.9 F (36.6 C) (Oral)   Resp 19   SpO2 95%   Physical Exam  Constitutional: She appears well-developed and well-nourished. No distress.  HENT:  Head: Normocephalic and atraumatic.  Right Ear: External ear normal.  Left Ear: External ear normal.  Nose: Nose normal.  Mouth/Throat: Oropharynx is clear and moist. No oropharyngeal exudate.  Eyes: Conjunctivae and EOM are normal. Pupils are equal, round, and reactive to light. Right eye exhibits no discharge. Left eye exhibits no discharge. No scleral icterus.  Neck: Normal range of motion. Neck supple.   Cardiovascular: Normal rate, regular rhythm, normal heart sounds and intact distal pulses.  No murmur heard. Pulmonary/Chest: Effort normal and breath sounds normal. No stridor. No respiratory distress. She has no wheezes. She has no rales.  Abdominal: Soft. She exhibits no distension. There is no tenderness.  Musculoskeletal: Normal range of motion. She exhibits no edema, tenderness or deformity.  Neurological: She is alert. No sensory deficit. She exhibits normal muscle tone.  Neurologic Exam:  - Mental status: Patient is alert and cooperative. Fluent speech and words are clear. Coherent thought processes and insight is good. Patient is oriented x 4 to person, place, time and event.  - Cranial nerves:  CN III, IV, VI: pupils equally round, reactive to light both direct and conscensual. Full extra-ocular movement. CN V: motor temporalis and masseter strength intact. CN VII : muscles of facial expression intact. CN X :  midline uvula. XI strength of sternocleidomastoid and trapezius muscles 5/5, XII: tongue is midline when protruded. - Motor: No involuntary movements. Muscle tone and bulk normal throughout. Muscle strength is 5/5 in bilateral shoulder abduction, elbow flexion and extension, grip, hip extension, flexion, leg flexion and extension, ankle dorsiflexion and plantar flexion.  - Sensory: Proprioception, light tough sensation intact in all extremities.  - Cerebellar: rapid alternating movements and point to point movement intact in upper and lower extremities. Normal stance and gait.  Skin: Skin is warm and dry. No rash noted. She is not diaphoretic. No erythema. No pallor.  Psychiatric: She has a normal mood and affect.  Nursing note and vitals reviewed.    ED Treatments / Results  Labs (all labs ordered are listed, but only abnormal results are displayed) Labs Reviewed  BASIC METABOLIC PANEL - Abnormal; Notable for the following components:      Result Value   BUN 29 (*)     Creatinine, Ser 1.84 (*)    GFR calc non Af Amer 26 (*)    GFR calc Af Amer 30 (*)    All other components within normal limits  CBC  I-STAT TROPONIN, ED   BUN  Date Value Ref Range Status  06/15/2017 29 (H) 6 - 20 mg/dL Final  04/13/2017 27 (H) 6 - 20 mg/dL Final  03/16/2017 27 (H) 6 - 20 mg/dL Final  01/19/2017 34 (H) 8 - 27 mg/dL Final  01/12/2017 28 (H) 8 -  27 mg/dL Final  12/09/2016 26 (H) 6 - 20 mg/dL Final   Creat  Date Value Ref Range Status  09/06/2015 1.78 (H) 0.60 - 0.93 mg/dL Final   Creatinine, Ser  Date Value Ref Range Status  06/15/2017 1.84 (H) 0.44 - 1.00 mg/dL Final  04/13/2017 1.70 (H) 0.44 - 1.00 mg/dL Final  03/16/2017 1.78 (H) 0.44 - 1.00 mg/dL Final  01/19/2017 1.85 (H) 0.57 - 1.00 mg/dL Final    EKG  EKG Interpretation  Date/Time:  Tuesday June 15 2017 11:44:39 EST Ventricular Rate:  98 PR Interval:  146 QRS Duration: 70 QT Interval:  358 QTC Calculation: 457 R Axis:   -16 Text Interpretation:  Normal sinus rhythm with sinus arrhythmia Indeterminate axis Nonspecific ST abnormality Abnormal ECG No significant change since last tracing Confirmed by Isla Pence [phone]) on 06/15/2017 3:36:39 PM       Radiology Dg Chest 2 View  Result Date: 06/15/2017 CLINICAL DATA:  Shortness of breath. Known atrial fibrillation, chronic CHF, pulmonary hypertension, never smoked. EXAM: CHEST  2 VIEW COMPARISON:  Chest x-ray of July 29, 2016 FINDINGS: The lungs are well-expanded. The interstitial markings are coarse though stable. There is a stable calcified nodule in the right mid lung consistent with previous granulomatous disease. Subtle nodular density in the right upper lobe is more conspicuous today than in the past. The heart and pulmonary vascularity are normal. There is calcification in the wall of the aortic arch. There is gentle S shaped thoracolumbar scoliosis which is stable. IMPRESSION: Chronic bronchitic changes, stable. Subtle increased  density in the right upper lobe is consistent with a small developing nodule. There is no alveolar pneumonia. No pulmonary edema. Previous granulomatous infection. Chest CT scanning is recommended when the patient can undergo the procedure to allow further evaluation of the new right upper lobe nodule. Thoracic aortic atherosclerosis. Electronically Signed   By: David  Martinique M.D.   On: 06/15/2017 12:58    Procedures Procedures (including critical care time)  Medications Ordered in ED Medications - No data to display   Initial Impression / Assessment and Plan / ED Course  I have reviewed the triage vital signs and the nursing notes.  Pertinent labs & imaging results that were available during my care of the patient were reviewed by me and considered in my medical decision making (see chart for details).     Patient presenting from [place] with an episode of presumed tachycardia. She experienced dizziness and lightheadedness this morning while showering, none since arrival. Reassuring exam, normal neuro, stable, nontoxic, afebrile and well-appearing. EKG without significant changes, negative troponin.  Discussed incidental CXR findings with patient and urged to follow up with PCP for outpatient imaging when appropriate.  Patient was very upset with the wait in the waiting room and reported "feeling abandoned". Spent significant amount of time with patient apologizing and attempting to provide reassurance that we had obtained labs and EKG at time of triage and that we would have been made aware of any concerning abnormal findings requiring a provider's immediate attention.   Patient was discharged home with close cardiology follow up and PCP. Patient has appointment scheduled in less than a week. Patient was discussed with Dr. Gilford Raid who has also seen patient and agrees with assessment and plan.  Discussed strict return precautions and advised to return to the emergency department if  experiencing any new or worsening symptoms. Instructions were understood and patient agreed with discharge plan.  Final Clinical Impressions(s) / ED Diagnoses  Final diagnoses:  Pre-syncope  Dizziness    ED Discharge Orders    None       Dossie Der 06/15/17 2218    Isla Pence, MD 06/16/17 [phone]

## 2017-06-15 NOTE — ED Triage Notes (Signed)
Pt to ER for evaluation of shortness of breath and atrial fibrillation. States woke up this morning with dizziness and shortness of breath, went to the ENT doctor for hearing aids & once there was told to come here due to SOB. Denies CP. Reports at home her pulse ranged 75-140. States took a valium prior to arrival. Here at this time HR is 98,  Pt states she feels much better.

## 2017-06-15 NOTE — ED Notes (Signed)
Patient walked to bathroom with assistance

## 2017-06-21 ENCOUNTER — Ambulatory Visit (HOSPITAL_COMMUNITY)
Admission: RE | Admit: 2017-06-21 | Discharge: 2017-06-21 | Disposition: A | Discharge status: Home / Self Care | Payer: Medicare Other | Source: Ambulatory Visit | Attending: Cardiology | Admitting: Cardiology

## 2017-06-21 ENCOUNTER — Encounter (HOSPITAL_COMMUNITY): Payer: Self-pay | Admitting: Cardiology

## 2017-06-21 ENCOUNTER — Encounter (HOSPITAL_COMMUNITY): Payer: Self-pay

## 2017-06-21 ENCOUNTER — Other Ambulatory Visit (HOSPITAL_COMMUNITY): Payer: Self-pay

## 2017-06-21 VITALS — BP 128/80 | HR 85 | Wt 150.0 lb

## 2017-06-21 DIAGNOSIS — I272 Pulmonary hypertension, unspecified: Secondary | ICD-10-CM

## 2017-06-21 DIAGNOSIS — K449 Diaphragmatic hernia without obstruction or gangrene: Secondary | ICD-10-CM | POA: Diagnosis not present

## 2017-06-21 DIAGNOSIS — I5022 Chronic systolic (congestive) heart failure: Secondary | ICD-10-CM

## 2017-06-21 DIAGNOSIS — I480 Paroxysmal atrial fibrillation: Secondary | ICD-10-CM | POA: Insufficient documentation

## 2017-06-21 DIAGNOSIS — E041 Nontoxic single thyroid nodule: Secondary | ICD-10-CM | POA: Diagnosis not present

## 2017-06-21 DIAGNOSIS — R918 Other nonspecific abnormal finding of lung field: Secondary | ICD-10-CM | POA: Diagnosis not present

## 2017-06-21 DIAGNOSIS — Z79899 Other long term (current) drug therapy: Secondary | ICD-10-CM | POA: Diagnosis not present

## 2017-06-21 DIAGNOSIS — I130 Hypertensive heart and chronic kidney disease with heart failure and stage 1 through stage 4 chronic kidney disease, or unspecified chronic kidney disease: Secondary | ICD-10-CM | POA: Insufficient documentation

## 2017-06-21 DIAGNOSIS — I2510 Atherosclerotic heart disease of native coronary artery without angina pectoris: Secondary | ICD-10-CM | POA: Diagnosis not present

## 2017-06-21 DIAGNOSIS — N183 Chronic kidney disease, stage 3 unspecified: Secondary | ICD-10-CM

## 2017-06-21 DIAGNOSIS — I5032 Chronic diastolic (congestive) heart failure: Secondary | ICD-10-CM | POA: Insufficient documentation

## 2017-06-21 DIAGNOSIS — R911 Solitary pulmonary nodule: Secondary | ICD-10-CM

## 2017-06-21 DIAGNOSIS — I700 Atherosclerosis of aorta: Secondary | ICD-10-CM | POA: Insufficient documentation

## 2017-06-21 MED ORDER — METOPROLOL SUCCINATE ER 25 MG PO TB24: 12.5000 mg | ORAL_TABLET | Freq: Every day | ORAL | 3 refills | Status: DC

## 2017-06-21 MED ORDER — SERTRALINE HCL 50 MG PO TABS: 50.0000 mg | ORAL_TABLET | Freq: Every day | ORAL | 3 refills | Status: DC

## 2017-06-21 NOTE — Patient Instructions (Signed)
Start Toprol XL 12.5 mg (0.5 tab) daily  Start Sertraline 50 mg (1 tab) daily  6 minute walk today  Your physician has recommended that you wear a holter monitor. Holter monitors are medical devices that record the heart's electrical activity. Doctors most often use these monitors to diagnose arrhythmias. Arrhythmias are problems with the speed or rhythm of the heartbeat. The monitor is a small, portable device. You can wear one while you do your normal daily activities. This is usually used to diagnose what is causing palpitations/syncope (passing out).  Your physician recommends that you schedule a follow-up appointment in: 1 month with Dr. [name]

## 2017-06-21 NOTE — Progress Notes (Addendum)
Pt attempted 6 minute walk test. Pt walked 200 ft (61 meters). O2 sats ranged from 78%-95%, and HR ranged from 80-96. With [facility] pt O2 sats is now at 99-100%. Information given to Dr. [name] who has now ordered O2.

## 2017-06-22 ENCOUNTER — Other Ambulatory Visit (HOSPITAL_COMMUNITY): Payer: Self-pay

## 2017-06-22 ENCOUNTER — Ambulatory Visit: Payer: Medicare Other | Admitting: Internal Medicine

## 2017-06-22 ENCOUNTER — Telehealth (HOSPITAL_COMMUNITY): Payer: Self-pay

## 2017-06-22 DIAGNOSIS — Z8639 Personal history of other endocrine, nutritional and metabolic disease: Secondary | ICD-10-CM

## 2017-06-22 DIAGNOSIS — I272 Pulmonary hypertension, unspecified: Secondary | ICD-10-CM

## 2017-06-22 NOTE — H&P (View-Only) (Signed)
Patient ID: Tiffany [name], female   DOB: Aug 17, 1945, 71 y.o.   MRN: [id number] PCP: Dr. Sharlet Salina HF Cardiology: Dr. [name]  71 yo with history of paroxysmal atrial fibrillation and diastolic CHF was noted to have significant pulmonary hypertension and exertional dyspnea.  She has history of paroxysmal atrial fibrillation and had an episode in 6/16 requiring cardioversion.  She was started on amiodarone but this was stopped with worsening breathing.  Cardiolite in 9/16 showed on ischemia or infarction.     I had her do a RHC in 1/17.  This showed elevated left and right heart filling pressures but also PAH. After cath, she increased her Lasix from 40 qod to 40 daily.  At a prior appointment, I increased Lasix to 40 mg bid and also started her on Opsumit.  At next appointment, I added Adcirca 20 and then titrated it up to 40 mg daily. She continue to be volume overloaded, so  I increased Lasix to 80 qam/40 qpm. She has seemed to do well on this dose.   Echo was done in 6/17, RV mildly dilated with moderately decreased systolic function and PASP 85 mmHg.    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   She had an atrial fibrillation ablation in 6/18.   She returns for followup of atrial fibrillation and pulmonary hypertension today.  She has been doing progressively worse for the last couple of months.  She is tearful today and very anxious.  She feels palpitations (skipped beats) that she identifies as PVCs fairly frequently.  She feels runs of prolonged atrial fibrillation more rarely.  She had irregular, fast rhythm at her ENT appointment on 11/13.  She was sent to the ER but was in NSR in the ER.  She is in NSR today.  She is more short of breath with exertion.  Very dyspneic with stairs/hills.  Not able to walk as far or as fast on flat ground.  No chest pain.  No orthopnea/PND.  Weight is stable.  She has been taking all her meds as ordered.   REDS vest measurement  today: 23%  ECG (personally reviewed): NSR, normal  6 minute walk (2/17): 141 m 6 minute walk (3/17): 182 m 6 minute walk (5/17): 229 m 6 minute walk (10/17): 305 m 6 minute walk (5/18): 256 m 6 minute walk (8/18): 427 m 6 minute walk (11/18): 61 m, oxygen saturation dropped as low as 70s.   Labs (12/16): BNP 1187, ANA 1:640, TSH elevated. Labs (2/17): K 5.4 => 3.9, creatinine 1.78 => 1.66, LDL 112, BNP 880 Labs (4/17): K 4.5, creatinine 1.76 Labs (5/17): K 4.1, creatinine 1.79 Labs (8/17): K 3.1, creatinine 1.7, BNP 404 Labs (9/17): K 3.9, creatinine 1.8, BNP 512 Labs (2/18): K 3.5, creatinine 1.71, BNP 157 Labs (5/18): LDL 120 Labs (6/18): K 3.6, creatinine 1.85 Labs (8/18): K 3.5, creatinine 1.78, BNP 88 Labs (11/18): K 3.6, creatinine 1.84, hgb 12.7  PMH:  1. CKD stage III 2. Bradycardia in setting of metoprolol + amiodarone use.  3. Raynauds phenomenon. 4. BPPV 5. OA right hip 6. Hypothyroidism 7. Atrial fibrillation: Paroxysmal.  She was on amiodarone in the past but this was stopped when she became more short of breath.  TEE-guided DCCV in 6/16.  - Atrial fibrillation ablation 6/18.  8. HTN 9. Chronic diastolic CHF with prominent RV failure: She has pulmonary arterial HTN that contributes to RV failure, but there is also a component  of diastolic CHF with elevated PCWP on RHC. - TEE (6/16) with EF 45-50%, mildly dilated RV with mildly decreased systolic function, PASP 36 mmHg.  - RHC (1/17) with mean RA 9, PA 80/29 mean 52, mean PCWP 27, CI 1.97 Fick/1.8 thermo, PVR 7.6 Fick/8.4 thermo.  - Echo (6/17): EF 60-65%, mild MR, mild RV dilation with moderately decreased systolic function, moderate TR, PASP 85 mmHg.  - Echo (6/18): EF 75-10%, normal diastolic function, normal RV size and systolic function, PASP 36 mmHg. 10. Pulmonary hypertension: See RHC above.  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  - TEE (6/16) with mildly dilated RV with mildly decreased systolic function. - ANA 2:585,  anti-centromere antibody elevated, anti-SCL-70 antibody negative, h/o Raynauds - PFTs (7/16) with minimal obstructive defect but moderately decreased DLCO.  - V/Q scan (2/17) negative for acute or chronic PE.  11. Cardiolite (9/16) with no ischemia/infarction.  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  13. Subclinical hypothyroidism.  14. OSA: Severe on 9/17 sleep study. Now using CPAP.   SH: Married, lives in [place], no smoking, no ETOH.   FH: Adopted.  Daughter has Raynaud's.  ROS: All systems reviewed and negative except as per HPI  Current Outpatient Medications  Medication Sig Dispense Refill  . acetaminophen (TYLENOL) 325 MG tablet Take 2 tablets (650 mg total) by mouth every 4 (four) hours as needed for headache or mild pain.    Marland Kitchen ADEMPAS 2 MG TABS Take 2 mg by mouth 3 (three) times daily.    Marland Kitchen apixaban (ELIQUIS) 5 MG TABS tablet Take 1 tablet (5 mg total) by mouth 2 (two) times daily. 180 tablet 1  . conjugated estrogens (PREMARIN) vaginal cream Place 1 Applicatorful every Saturday vaginally. Using 1.5 grams weekly    . diazepam (VALIUM) 5 MG tablet TAKE 1 TABLET EVERY 8 HOURS AS NEEDED FOR ANXIETY (Patient not taking: Reported on 06/21/2017) 30 tablet 2  . famotidine (PEPCID) 20 MG tablet Take 20 mg by mouth daily at 12 noon.     . furosemide (LASIX) 40 MG tablet TAKE 2 TABLETS BY MOUTH EVERY MORNING AND 1 TABLET EVERY EVENING (Patient taking differently: TAKE 2 TABLETS BY MOUTH EVERY MORNING AND 1 TABLET EVERY AFTERNOON.) 270 tablet 3  . Macitentan (OPSUMIT) 10 MG TABS Take 1 tablet (10 mg total) by mouth daily. (Patient taking differently: Take 10 mg daily at 12 noon by mouth. MID-AFTERNOON) 30 tablet 11  . potassium chloride SA (KLOR-CON M20) 20 MEQ tablet Take 2 tablets (40 mEq total) by mouth daily. (Patient taking differently: Take 20 mEq 2 (two) times daily by mouth. AT BREAKFAST & AFTER LUNCH) 60 tablet 3  . Probiotic Product (PROBIOTIC-10) CAPS Take 1  capsule by mouth daily.     . Selexipag (UPTRAVI) 600 MCG TABS Take 600 mcg by mouth 2 (two) times daily. 60 tablet 11  . fluticasone (FLONASE) 50 MCG/ACT nasal spray Place 1 spray daily as needed into both nostrils for allergies or rhinitis.    . metoprolol succinate (TOPROL-XL) 25 MG 24 hr tablet Take 0.5 tablets (12.5 mg total) daily by mouth. Take with or immediately following a meal. 15 tablet 3  . sertraline (ZOLOFT) 50 MG tablet Take 1 tablet (50 mg total) daily by mouth. 30 tablet 3   No current facility-administered medications for this encounter.    BP 128/80   Pulse 85   Wt 150 lb (68 kg)   SpO2 92%   BMI 29.29 kg/m   General: NAD  Neck: No JVD, no thyromegaly or thyroid nodule.  Lungs: Clear to auscultation bilaterally with normal respiratory effort. CV: Nondisplaced PMI.  Heart regular S1/S2, no S3/S4, no murmur.  No peripheral edema.  No carotid bruit.  Normal pedal pulses.  Abdomen: Soft, nontender, no hepatosplenomegaly, no distention.  Skin: Intact without lesions or rashes.  Neurologic: Alert and oriented x 3.  Psych: Anxious. Extremities: No clubbing or cyanosis.  HEENT: Normal.   Assessment/Plan: 1. Pulmonary hypertension: Patient has pulmonary arterial hypertension.  She appears to have co-existing diastolic LV dysfunction given elevated PCWP on prior RHC in 1/17.  PFTs did not show significant obstruction, they only showed moderately decreased DLCO consistent with pulmonary vascular disease. V/Q scan did not show evidence for chronic PE.  PVR 7.6 WU by Fick and 8.4 WU by thermodiluation.  Cardiac index was low, 1.97 Fick/1.8 thermo. 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  Most recent echo in 6/18 showed normal RV with PASP 36 mmHg. Her 6 minute walk had been improving over time and she had been doing  much better on PH meds, but over the last couple months she has taken a significant step backwards.  6 minute walk much less today than in the past and oxygen saturation low with exertion.  She has continued on all her PH medications without missing doses.  - Continue Adempas at 2 mg tid (did not tolerate increase to 2.5 mg tid).  - She has sleep apnea, now using CPAP.  - Continue Opsumit and Selexipag, unable to increase Selexipag beyond 600 mcg bid due to side effects.  - I am concerned for worsening of PH despite medical management.  I will arrange for RHC next week.  We discussed risks/benefits and she agrees to proceed.  - Oxygen saturation is ok at rest but dropped into the 70s with exertion.  I will arrange for home oxygen to be used with exertion.  2. Chronic diastolic CHF: Elevated PCWP on 1/17 RHC.  Last echo in 6/18 with EF 60-65%.  She feels symptomatically worse, NYHA class IIIb.  On exam, however, and by REDS vest measurement, she is not volume overloaded.  - Continue Lasix 80 qam/40 qpm.  3. Atrial fibrillation: Paroxysmal.  She is in NSR today.  She had atrial fibrillation ablation in 6/18. She has occasional episodes that feel like breakthrough afib but not very frequent.  She has other palpitations (associated with pauses) that she attributes to PVCs.  Palpitations are bothersome.  - Continue apixaban => hold the day before RHC.  - I will arrange for 24 hour holter monitor to try to better delineate her arrhythmia burder => does she have frequent PVCs or runs of atrial fibrillation?  - She has had a hard time tolerating beta blockers in the past.  I will try her on a low dose of Toprol XL 12.5 mg to be taken in the evening.  This may help to control PVCs and lessen palpitations.  - Tikosyn would be an option for arrhythmia control, but would be more difficult due to impaired renal function.  4. CKD: Stage III.  5. Lung nodules: Will arrange CT chest w/o contrast to followup.  This  will allow a look at the lung parenchyma as well given worsening dyspnea.   Followup in 1 month.  Loralie Champagne 06/22/2017

## 2017-06-22 NOTE — Progress Notes (Signed)
Patient ID: Tiffany [name], female   DOB: 10/12/1945, 71 y.o.   MRN: [id number] PCP: Dr. Sharlet Salina HF Cardiology: Dr. [name]  71 yo with history of paroxysmal atrial fibrillation and diastolic CHF was noted to have significant pulmonary hypertension and exertional dyspnea.  She has history of paroxysmal atrial fibrillation and had an episode in 6/16 requiring cardioversion.  She was started on amiodarone but this was stopped with worsening breathing.  Cardiolite in 9/16 showed on ischemia or infarction.     I had her do a RHC in 1/17.  This showed elevated left and right heart filling pressures but also PAH. After cath, she increased her Lasix from 40 qod to 40 daily.  At a prior appointment, I increased Lasix to 40 mg bid and also started her on Opsumit.  At next appointment, I added Adcirca 20 and then titrated it up to 40 mg daily. She continue to be volume overloaded, so  I increased Lasix to 80 qam/40 qpm. She has seemed to do well on this dose.   Echo was done in 6/17, RV mildly dilated with moderately decreased systolic function and PASP 85 mmHg.    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   She had an atrial fibrillation ablation in 6/18.   She returns for followup of atrial fibrillation and pulmonary hypertension today.  She has been doing progressively worse for the last couple of months.  She is tearful today and very anxious.  She feels palpitations (skipped beats) that she identifies as PVCs fairly frequently.  She feels runs of prolonged atrial fibrillation more rarely.  She had irregular, fast rhythm at her ENT appointment on 11/13.  She was sent to the ER but was in NSR in the ER.  She is in NSR today.  She is more short of breath with exertion.  Very dyspneic with stairs/hills.  Not able to walk as far or as fast on flat ground.  No chest pain.  No orthopnea/PND.  Weight is stable.  She has been taking all her meds as ordered.   REDS vest measurement  today: 23%  ECG (personally reviewed): NSR, normal  6 minute walk (2/17): 141 m 6 minute walk (3/17): 182 m 6 minute walk (5/17): 229 m 6 minute walk (10/17): 305 m 6 minute walk (5/18): 256 m 6 minute walk (8/18): 427 m 6 minute walk (11/18): 61 m, oxygen saturation dropped as low as 70s.   Labs (12/16): BNP 1187, ANA 1:640, TSH elevated. Labs (2/17): K 5.4 => 3.9, creatinine 1.78 => 1.66, LDL 112, BNP 880 Labs (4/17): K 4.5, creatinine 1.76 Labs (5/17): K 4.1, creatinine 1.79 Labs (8/17): K 3.1, creatinine 1.7, BNP 404 Labs (9/17): K 3.9, creatinine 1.8, BNP 512 Labs (2/18): K 3.5, creatinine 1.71, BNP 157 Labs (5/18): LDL 120 Labs (6/18): K 3.6, creatinine 1.85 Labs (8/18): K 3.5, creatinine 1.78, BNP 88 Labs (11/18): K 3.6, creatinine 1.84, hgb 12.7  PMH:  1. CKD stage III 2. Bradycardia in setting of metoprolol + amiodarone use.  3. Raynauds phenomenon. 4. BPPV 5. OA right hip 6. Hypothyroidism 7. Atrial fibrillation: Paroxysmal.  She was on amiodarone in the past but this was stopped when she became more short of breath.  TEE-guided DCCV in 6/16.  - Atrial fibrillation ablation 6/18.  8. HTN 9. Chronic diastolic CHF with prominent RV failure: She has pulmonary arterial HTN that contributes to RV failure, but there is also a component  of diastolic CHF with elevated PCWP on RHC. - TEE (6/16) with EF 45-50%, mildly dilated RV with mildly decreased systolic function, PASP 36 mmHg.  - RHC (1/17) with mean RA 9, PA 80/29 mean 52, mean PCWP 27, CI 1.97 Fick/1.8 thermo, PVR 7.6 Fick/8.4 thermo.  - Echo (6/17): EF 60-65%, mild MR, mild RV dilation with moderately decreased systolic function, moderate TR, PASP 85 mmHg.  - Echo (6/18): EF 75-91%, normal diastolic function, normal RV size and systolic function, PASP 36 mmHg. 10. Pulmonary hypertension: See RHC above.  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  - TEE (6/16) with mildly dilated RV with mildly decreased systolic function. - ANA 6:384,  anti-centromere antibody elevated, anti-SCL-70 antibody negative, h/o Raynauds - PFTs (7/16) with minimal obstructive defect but moderately decreased DLCO.  - V/Q scan (2/17) negative for acute or chronic PE.  11. Cardiolite (9/16) with no ischemia/infarction.  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  13. Subclinical hypothyroidism.  14. OSA: Severe on 9/17 sleep study. Now using CPAP.   SH: Married, lives in [place], no smoking, no ETOH.   FH: Adopted.  Daughter has Raynaud's.  ROS: All systems reviewed and negative except as per HPI  Current Outpatient Medications  Medication Sig Dispense Refill  . acetaminophen (TYLENOL) 325 MG tablet Take 2 tablets (650 mg total) by mouth every 4 (four) hours as needed for headache or mild pain.    Marland Kitchen ADEMPAS 2 MG TABS Take 2 mg by mouth 3 (three) times daily.    Marland Kitchen apixaban (ELIQUIS) 5 MG TABS tablet Take 1 tablet (5 mg total) by mouth 2 (two) times daily. 180 tablet 1  . conjugated estrogens (PREMARIN) vaginal cream Place 1 Applicatorful every Saturday vaginally. Using 1.5 grams weekly    . diazepam (VALIUM) 5 MG tablet TAKE 1 TABLET EVERY 8 HOURS AS NEEDED FOR ANXIETY (Patient not taking: Reported on 06/21/2017) 30 tablet 2  . famotidine (PEPCID) 20 MG tablet Take 20 mg by mouth daily at 12 noon.     . furosemide (LASIX) 40 MG tablet TAKE 2 TABLETS BY MOUTH EVERY MORNING AND 1 TABLET EVERY EVENING (Patient taking differently: TAKE 2 TABLETS BY MOUTH EVERY MORNING AND 1 TABLET EVERY AFTERNOON.) 270 tablet 3  . Macitentan (OPSUMIT) 10 MG TABS Take 1 tablet (10 mg total) by mouth daily. (Patient taking differently: Take 10 mg daily at 12 noon by mouth. MID-AFTERNOON) 30 tablet 11  . potassium chloride SA (KLOR-CON M20) 20 MEQ tablet Take 2 tablets (40 mEq total) by mouth daily. (Patient taking differently: Take 20 mEq 2 (two) times daily by mouth. AT BREAKFAST & AFTER LUNCH) 60 tablet 3  . Probiotic Product (PROBIOTIC-10) CAPS Take 1  capsule by mouth daily.     . Selexipag (UPTRAVI) 600 MCG TABS Take 600 mcg by mouth 2 (two) times daily. 60 tablet 11  . fluticasone (FLONASE) 50 MCG/ACT nasal spray Place 1 spray daily as needed into both nostrils for allergies or rhinitis.    . metoprolol succinate (TOPROL-XL) 25 MG 24 hr tablet Take 0.5 tablets (12.5 mg total) daily by mouth. Take with or immediately following a meal. 15 tablet 3  . sertraline (ZOLOFT) 50 MG tablet Take 1 tablet (50 mg total) daily by mouth. 30 tablet 3   No current facility-administered medications for this encounter.    BP 128/80   Pulse 85   Wt 150 lb (68 kg)   SpO2 92%   BMI 29.29 kg/m   General: NAD  Neck: No JVD, no thyromegaly or thyroid nodule.  Lungs: Clear to auscultation bilaterally with normal respiratory effort. CV: Nondisplaced PMI.  Heart regular S1/S2, no S3/S4, no murmur.  No peripheral edema.  No carotid bruit.  Normal pedal pulses.  Abdomen: Soft, nontender, no hepatosplenomegaly, no distention.  Skin: Intact without lesions or rashes.  Neurologic: Alert and oriented x 3.  Psych: Anxious. Extremities: No clubbing or cyanosis.  HEENT: Normal.   Assessment/Plan: 1. Pulmonary hypertension: Patient has pulmonary arterial hypertension.  She appears to have co-existing diastolic LV dysfunction given elevated PCWP on prior RHC in 1/17.  PFTs did not show significant obstruction, they only showed moderately decreased DLCO consistent with pulmonary vascular disease. V/Q scan did not show evidence for chronic PE.  PVR 7.6 WU by Fick and 8.4 WU by thermodiluation.  Cardiac index was low, 1.97 Fick/1.8 thermo. 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  Most recent echo in 6/18 showed normal RV with PASP 36 mmHg. Her 6 minute walk had been improving over time and she had been doing  much better on PH meds, but over the last couple months she has taken a significant step backwards.  6 minute walk much less today than in the past and oxygen saturation low with exertion.  She has continued on all her PH medications without missing doses.  - Continue Adempas at 2 mg tid (did not tolerate increase to 2.5 mg tid).  - She has sleep apnea, now using CPAP.  - Continue Opsumit and Selexipag, unable to increase Selexipag beyond 600 mcg bid due to side effects.  - I am concerned for worsening of PH despite medical management.  I will arrange for RHC next week.  We discussed risks/benefits and she agrees to proceed.  - Oxygen saturation is ok at rest but dropped into the 70s with exertion.  I will arrange for home oxygen to be used with exertion.  2. Chronic diastolic CHF: Elevated PCWP on 1/17 RHC.  Last echo in 6/18 with EF 60-65%.  She feels symptomatically worse, NYHA class IIIb.  On exam, however, and by REDS vest measurement, she is not volume overloaded.  - Continue Lasix 80 qam/40 qpm.  3. Atrial fibrillation: Paroxysmal.  She is in NSR today.  She had atrial fibrillation ablation in 6/18. She has occasional episodes that feel like breakthrough afib but not very frequent.  She has other palpitations (associated with pauses) that she attributes to PVCs.  Palpitations are bothersome.  - Continue apixaban => hold the day before RHC.  - I will arrange for 24 hour holter monitor to try to better delineate her arrhythmia burder => does she have frequent PVCs or runs of atrial fibrillation?  - She has had a hard time tolerating beta blockers in the past.  I will try her on a low dose of Toprol XL 12.5 mg to be taken in the evening.  This may help to control PVCs and lessen palpitations.  - Tikosyn would be an option for arrhythmia control, but would be more difficult due to impaired renal function.  4. CKD: Stage III.  5. Lung nodules: Will arrange CT chest w/o contrast to followup.  This  will allow a look at the lung parenchyma as well given worsening dyspnea.   Followup in 1 month.  Loralie Champagne 06/22/2017

## 2017-06-22 NOTE — Telephone Encounter (Signed)
Notes recorded by Shirley Muscat, RN on 06/22/2017 at 11:07 AM EST Pt aware of results, order placed for US thyroid ------  Notes recorded by Larey Dresser, MD on 06/21/2017 at 11:40 PM EST Multiple tiny, scattered pulmonary nodules; likely benign.   Small thyroid nodule, recommend followup with thyroid US (most likely benign).

## 2017-06-23 ENCOUNTER — Other Ambulatory Visit (HOSPITAL_COMMUNITY): Payer: Self-pay

## 2017-06-23 DIAGNOSIS — I272 Pulmonary hypertension, unspecified: Secondary | ICD-10-CM

## 2017-06-23 NOTE — Addendum Note (Signed)
Encounter addended by: Shirley Muscat, RN on: 06/23/2017 2:39 PM  Actions taken: Sign clinical note

## 2017-06-28 ENCOUNTER — Other Ambulatory Visit (HOSPITAL_COMMUNITY): Payer: Self-pay | Admitting: *Deleted

## 2017-06-29 NOTE — Progress Notes (Signed)
SATURATION QUALIFICATIONS: (This note is used to comply with regulatory documentation for home oxygen)  Patient Saturations on Room Air at Rest = 95%  Patient Saturations on Room Air while Ambulating = 78%  Patient Saturations on 2 Liters of oxygen while Ambulating = 99%  Please briefly explain why patient needs home oxygen: hypoxia w/ambulation and heart failure

## 2017-06-29 NOTE — Addendum Note (Signed)
Encounter addended by: Scarlette Calico, RN on: 06/29/2017 4:32 PM  Actions taken: Sign clinical note

## 2017-06-30 ENCOUNTER — Encounter (HOSPITAL_COMMUNITY): Payer: Self-pay | Admitting: Cardiology

## 2017-06-30 ENCOUNTER — Encounter (HOSPITAL_COMMUNITY)
Admission: RE | Disposition: A | Discharge status: Home / Self Care | Payer: Self-pay | Source: Ambulatory Visit | Attending: Cardiology

## 2017-06-30 ENCOUNTER — Ambulatory Visit (HOSPITAL_COMMUNITY)
Admission: RE | Admit: 2017-06-30 | Discharge: 2017-06-30 | Disposition: A | Discharge status: Home / Self Care | Payer: Medicare Other | Source: Ambulatory Visit | Attending: Cardiology | Admitting: Cardiology

## 2017-06-30 DIAGNOSIS — I2721 Secondary pulmonary arterial hypertension: Secondary | ICD-10-CM | POA: Insufficient documentation

## 2017-06-30 DIAGNOSIS — E039 Hypothyroidism, unspecified: Secondary | ICD-10-CM | POA: Insufficient documentation

## 2017-06-30 DIAGNOSIS — Z7951 Long term (current) use of inhaled steroids: Secondary | ICD-10-CM | POA: Diagnosis not present

## 2017-06-30 DIAGNOSIS — I130 Hypertensive heart and chronic kidney disease with heart failure and stage 1 through stage 4 chronic kidney disease, or unspecified chronic kidney disease: Secondary | ICD-10-CM | POA: Diagnosis not present

## 2017-06-30 DIAGNOSIS — I5032 Chronic diastolic (congestive) heart failure: Secondary | ICD-10-CM | POA: Insufficient documentation

## 2017-06-30 DIAGNOSIS — N183 Chronic kidney disease, stage 3 (moderate): Secondary | ICD-10-CM | POA: Diagnosis not present

## 2017-06-30 DIAGNOSIS — Z7901 Long term (current) use of anticoagulants: Secondary | ICD-10-CM | POA: Insufficient documentation

## 2017-06-30 DIAGNOSIS — G4733 Obstructive sleep apnea (adult) (pediatric): Secondary | ICD-10-CM | POA: Insufficient documentation

## 2017-06-30 DIAGNOSIS — H8110 Benign paroxysmal vertigo, unspecified ear: Secondary | ICD-10-CM | POA: Insufficient documentation

## 2017-06-30 DIAGNOSIS — I7300 Raynaud's syndrome without gangrene: Secondary | ICD-10-CM | POA: Diagnosis not present

## 2017-06-30 DIAGNOSIS — I5022 Chronic systolic (congestive) heart failure: Secondary | ICD-10-CM

## 2017-06-30 DIAGNOSIS — I272 Pulmonary hypertension, unspecified: Secondary | ICD-10-CM | POA: Diagnosis not present

## 2017-06-30 HISTORY — PX: RIGHT HEART CATH: CATH118263

## 2017-06-30 LAB — POCT I-STAT 3, VENOUS BLOOD GAS (G3P V)
Acid-Base Excess: 1 mmol/L (ref 0.0–2.0)
Bicarbonate: 23.8 mmol/L (ref 20.0–28.0)
Bicarbonate: 24.2 mmol/L (ref 20.0–28.0)
O2 Saturation: 71 %
O2 Saturation: 73 %
TCO2: 25 mmol/L (ref 22–32)
TCO2: 25 mmol/L (ref 22–32)
pCO2, Ven: 32.6 mmHg — ABNORMAL LOW (ref 44.0–60.0)
pCO2, Ven: 32.7 mmHg — ABNORMAL LOW (ref 44.0–60.0)
pH, Ven: 7.471 — ABNORMAL HIGH (ref 7.250–7.430)
pH, Ven: 7.476 — ABNORMAL HIGH (ref 7.250–7.430)
pO2, Ven: 34 mmHg (ref 32.0–45.0)
pO2, Ven: 35 mmHg (ref 32.0–45.0)

## 2017-06-30 LAB — CBC
HCT: 34.8 % — ABNORMAL LOW (ref 36.0–46.0)
Hemoglobin: 11.2 g/dL — ABNORMAL LOW (ref 12.0–15.0)
MCH: 28.9 pg (ref 26.0–34.0)
MCHC: 32.2 g/dL (ref 30.0–36.0)
MCV: 89.9 fL (ref 78.0–100.0)
Platelets: 161 10*3/uL (ref 150–400)
RBC: 3.87 MIL/uL (ref 3.87–5.11)
RDW: 15.1 % (ref 11.5–15.5)
WBC: 4.4 10*3/uL (ref 4.0–10.5)

## 2017-06-30 LAB — POCT I-STAT, CHEM 8
BUN: 24 mg/dL — ABNORMAL HIGH (ref 6–20)
Calcium, Ion: 1.22 mmol/L (ref 1.15–1.40)
Chloride: 104 mmol/L (ref 101–111)
Creatinine, Ser: 1.8 mg/dL — ABNORMAL HIGH (ref 0.44–1.00)
Glucose, Bld: 94 mg/dL (ref 65–99)
HCT: 34 % — ABNORMAL LOW (ref 36.0–46.0)
Hemoglobin: 11.6 g/dL — ABNORMAL LOW (ref 12.0–15.0)
Potassium: 3.1 mmol/L — ABNORMAL LOW (ref 3.5–5.1)
Sodium: 141 mmol/L (ref 135–145)
TCO2: 24 mmol/L (ref 22–32)

## 2017-06-30 LAB — BASIC METABOLIC PANEL
Anion gap: 7 (ref 5–15)
BUN: 23 mg/dL — ABNORMAL HIGH (ref 6–20)
CO2: 24 mmol/L (ref 22–32)
Calcium: 8.8 mg/dL — ABNORMAL LOW (ref 8.9–10.3)
Chloride: 106 mmol/L (ref 101–111)
Creatinine, Ser: 1.71 mg/dL — ABNORMAL HIGH (ref 0.44–1.00)
GFR calc Af Amer: 33 mL/min — ABNORMAL LOW (ref >60)
GFR calc non Af Amer: 28 mL/min — ABNORMAL LOW (ref >60)
Glucose, Bld: 96 mg/dL (ref 65–99)
Potassium: 3.2 mmol/L — ABNORMAL LOW (ref 3.5–5.1)
Sodium: 137 mmol/L (ref 135–145)

## 2017-06-30 SURGERY — RIGHT HEART CATH
Anesthesia: LOCAL

## 2017-06-30 MED ORDER — FENTANYL CITRATE (PF) 100 MCG/2ML IJ SOLN
INTRAMUSCULAR | Status: DC | PRN
  Administered 2017-06-30: 25 ug via INTRAVENOUS

## 2017-06-30 MED ORDER — LIDOCAINE HCL (PF) 1 % IJ SOLN
INTRAMUSCULAR | Status: AC
  Filled 2017-06-30: qty 30

## 2017-06-30 MED ORDER — LIDOCAINE HCL (PF) 1 % IJ SOLN
INTRAMUSCULAR | Status: DC | PRN
  Administered 2017-06-30: 2 mL

## 2017-06-30 MED ORDER — MIDAZOLAM HCL 2 MG/2ML IJ SOLN
INTRAMUSCULAR | Status: DC | PRN
  Administered 2017-06-30: 1 mg via INTRAVENOUS

## 2017-06-30 MED ORDER — ASPIRIN 81 MG PO CHEW
CHEWABLE_TABLET | ORAL | Status: AC
  Administered 2017-06-30: 81 mg via ORAL
  Filled 2017-06-30: qty 1

## 2017-06-30 MED ORDER — ASPIRIN 81 MG PO CHEW
81.0000 mg | CHEWABLE_TABLET | ORAL | Status: AC
  Administered 2017-06-30: 81 mg via ORAL

## 2017-06-30 MED ORDER — HEPARIN (PORCINE) IN NACL 2-0.9 UNIT/ML-% IJ SOLN
INTRAMUSCULAR | Status: AC
  Filled 2017-06-30: qty 500

## 2017-06-30 MED ORDER — HEPARIN (PORCINE) IN NACL 2-0.9 UNIT/ML-% IJ SOLN
INTRAMUSCULAR | Status: AC | PRN
  Administered 2017-06-30: 500 mL

## 2017-06-30 MED ORDER — SODIUM CHLORIDE 0.9% FLUSH: 3.0000 mL | INTRAVENOUS | Status: DC | PRN

## 2017-06-30 MED ORDER — FENTANYL CITRATE (PF) 100 MCG/2ML IJ SOLN
INTRAMUSCULAR | Status: AC
  Filled 2017-06-30: qty 2

## 2017-06-30 MED ORDER — SODIUM CHLORIDE 0.9 % IV SOLN: 250.0000 mL | INTRAVENOUS | Status: DC | PRN

## 2017-06-30 MED ORDER — SODIUM CHLORIDE 0.9% FLUSH: 3.0000 mL | Freq: Two times a day (BID) | INTRAVENOUS | Status: DC

## 2017-06-30 MED ORDER — MIDAZOLAM HCL 2 MG/2ML IJ SOLN
INTRAMUSCULAR | Status: AC
  Filled 2017-06-30: qty 2

## 2017-06-30 MED ORDER — SODIUM CHLORIDE 0.9 % IV SOLN: INTRAVENOUS | Status: DC

## 2017-06-30 SURGICAL SUPPLY — 7 items
CATH BALLN WEDGE 5F 110CM (CATHETERS) ×1 IMPLANT
KIT HEART LEFT (KITS) ×2 IMPLANT
PACK CARDIAC CATHETERIZATION (CUSTOM PROCEDURE TRAY) ×2 IMPLANT
SHEATH GLIDE SLENDER 4/5FR (SHEATH) ×1 IMPLANT
SYR MEDRAD MARK V 150ML (SYRINGE) ×2 IMPLANT
TRANSDUCER W/STOPCOCK (MISCELLANEOUS) ×2 IMPLANT
TUBING CIL FLEX 10 FLL-RA (TUBING) ×2 IMPLANT

## 2017-06-30 NOTE — Discharge Instructions (Signed)
Moderate Conscious Sedation, Adult, Care After °These instructions provide you with information about caring for yourself after your procedure. Your health care provider may also give you more specific instructions. Your treatment has been planned according to current medical practices, but problems sometimes occur. Call your health care provider if you have any problems or questions after your procedure. °What can I expect after the procedure? °After your procedure, it is common: °· To feel sleepy for several hours. °· To feel clumsy and have poor balance for several hours. °· To have poor judgment for several hours. °· To vomit if you eat too soon. ° °Follow these instructions at home: °For at least 24 hours after the procedure: ° °· Do not: °? Participate in activities where you could fall or become injured. °? Drive. °? Use heavy machinery. °? Drink alcohol. °? Take sleeping pills or medicines that cause drowsiness. °? Make important decisions or sign legal documents. °? Take care of children on your own. °· Rest. °Eating and drinking °· Follow the diet recommended by your health care provider. °· If you vomit: °? Drink water, juice, or soup when you can drink without vomiting. °? Make sure you have little or no nausea before eating solid foods. °General instructions °· Have a responsible adult stay with you until you are awake and alert. °· Take over-the-counter and prescription medicines only as told by your health care provider. °· If you smoke, do not smoke without supervision. °· Keep all follow-up visits as told by your health care provider. This is important. °Contact a health care provider if: °· You keep feeling nauseous or you keep vomiting. °· You feel light-headed. °· You develop a rash. °· You have a fever. °Get help right away if: °· You have trouble breathing. °This information is not intended to replace advice given to you by your health care provider. Make sure you discuss any questions you have  with your health care provider. °Document Released: 05/10/2013 Document Revised: 12/23/2015 Document Reviewed: 11/09/2015 °Elsevier Interactive Patient Education © 2018 [employer] °Venogram, Care After °This sheet gives you information about how to care for yourself after your procedure. Your health care provider may also give you more specific instructions. If you have problems or questions, contact your health care provider. °What can I expect after the procedure? °After the procedure, it is common to have: °· Bruising or mild discomfort in the area where the IV was inserted (insertion site). ° °Follow these instructions at home: °Eating and drinking °· Follow instructions from your health care provider about eating or drinking restrictions. °· Drink a lot of fluids for the first several days after the procedure, as directed by your health care provider. This helps to wash (flush) the contrast out of your body. Examples of healthy fluids include water or low-calorie drinks. °General instructions °· Check your IV insertion area every day for signs of infection. Check for: °? Redness, swelling, or pain. °? Fluid or blood. °? Warmth. °? Pus or a bad smell. °· Take over-the-counter and prescription medicines only as told by your health care provider. °· Rest and return to your normal activities as told by your health care provider. Ask your health care provider what activities are safe for you. °· Do not drive for 24 hours if you were given a medicine to help you relax (sedative), or until your health care provider approves. °· Keep all follow-up visits as told by your health care provider. This is important. °Contact a   health care provider if: °· Your skin becomes itchy or you develop a rash or hives. °· You have a fever that does not get better with medicine. °· You feel nauseous. °· You vomit. °· You have redness, swelling, or pain around the insertion site. °· You have fluid or blood coming from the insertion  site. °· Your insertion area feels warm to the touch. °· You have pus or a bad smell coming from the insertion site. °Get help right away if: °· You have difficulty breathing or shortness of breath. °· You develop chest pain. °· You faint. °· You feel very dizzy. °These symptoms may represent a serious problem that is an emergency. Do not wait to see if the symptoms will go away. Get medical help right away. Call your local emergency services (911 in the [place] Do not drive yourself to the hospital. °Summary °· After your procedure, it is common to have bruising or mild discomfort in the area where the IV was inserted. °· You should check your IV insertion area every day for signs of infection. °· Take over-the-counter and prescription medicines only as told by your health care provider. °· You should drink a lot of fluids for the first several days after the procedure to help flush the contrast from your body. °This information is not intended to replace advice given to you by your health care provider. Make sure you discuss any questions you have with your health care provider. °Document Released: 05/10/2013 Document Revised: 06/13/2016 Document Reviewed: 06/13/2016 °Elsevier Interactive Patient Education © 2017 [employer] ° °

## 2017-06-30 NOTE — Interval H&P Note (Signed)
History and Physical Interval Note:  06/30/2017 9:51 AM  Tiffany [name]  has presented today for surgery, with the diagnosis of hf  The various methods of treatment have been discussed with the patient and family. After consideration of risks, benefits and other options for treatment, the patient has consented to  Procedure(s): RIGHT HEART CATH (N/A) as a surgical intervention .  The patient's history has been reviewed, patient examined, no change in status, stable for surgery.  I have reviewed the patient's chart and labs.  Questions were answered to the patient's satisfaction.     Tiffany [name]

## 2017-07-06 ENCOUNTER — Other Ambulatory Visit: Payer: Self-pay

## 2017-07-06 ENCOUNTER — Ambulatory Visit (HOSPITAL_COMMUNITY): Payer: Medicare Other | Attending: Cardiology

## 2017-07-06 ENCOUNTER — Other Ambulatory Visit (HOSPITAL_COMMUNITY): Payer: Self-pay | Admitting: *Deleted

## 2017-07-06 ENCOUNTER — Other Ambulatory Visit (HOSPITAL_COMMUNITY): Payer: Self-pay | Admitting: Cardiology

## 2017-07-06 ENCOUNTER — Ambulatory Visit (INDEPENDENT_AMBULATORY_CARE_PROVIDER_SITE_OTHER): Payer: Medicare Other

## 2017-07-06 DIAGNOSIS — I5032 Chronic diastolic (congestive) heart failure: Secondary | ICD-10-CM | POA: Insufficient documentation

## 2017-07-06 DIAGNOSIS — R001 Bradycardia, unspecified: Secondary | ICD-10-CM | POA: Insufficient documentation

## 2017-07-06 DIAGNOSIS — I480 Paroxysmal atrial fibrillation: Secondary | ICD-10-CM

## 2017-07-06 DIAGNOSIS — I493 Ventricular premature depolarization: Secondary | ICD-10-CM

## 2017-07-06 DIAGNOSIS — I272 Pulmonary hypertension, unspecified: Secondary | ICD-10-CM | POA: Diagnosis not present

## 2017-07-06 DIAGNOSIS — N189 Chronic kidney disease, unspecified: Secondary | ICD-10-CM | POA: Insufficient documentation

## 2017-07-06 DIAGNOSIS — G4733 Obstructive sleep apnea (adult) (pediatric): Secondary | ICD-10-CM | POA: Insufficient documentation

## 2017-07-06 DIAGNOSIS — E039 Hypothyroidism, unspecified: Secondary | ICD-10-CM | POA: Insufficient documentation

## 2017-07-06 DIAGNOSIS — I340 Nonrheumatic mitral (valve) insufficiency: Secondary | ICD-10-CM | POA: Insufficient documentation

## 2017-07-06 MED ORDER — RIOCIGUAT 2.5 MG PO TABS: 2.5000 mg | ORAL_TABLET | Freq: Three times a day (TID) | ORAL | 3 refills | Status: DC

## 2017-07-06 MED ORDER — POTASSIUM CHLORIDE CRYS ER 20 MEQ PO TBCR: 40.0000 meq | EXTENDED_RELEASE_TABLET | Freq: Every day | ORAL | 3 refills | Status: DC

## 2017-07-14 ENCOUNTER — Telehealth (HOSPITAL_COMMUNITY): Payer: Self-pay | Admitting: Pharmacist

## 2017-07-14 NOTE — Telephone Encounter (Signed)
Adempas 2 mg TID PA approved by [employer] through 08/02/18.   Ruta Hinds. Velva Harman, PharmD, BCPS, CPP Clinical Pharmacist Pager: [phone] Phone: [phone] 07/14/2017 11:25 AM

## 2017-07-16 ENCOUNTER — Telehealth (HOSPITAL_COMMUNITY): Payer: Self-pay | Admitting: Pharmacist

## 2017-07-16 NOTE — Telephone Encounter (Signed)
Opsumit PA approved by [employer] through 08/02/18.   Ruta Hinds. Velva Harman, PharmD, BCPS, CPP Clinical Pharmacist Pager: [phone] Phone: [phone] 07/16/2017 11:53 AM

## 2017-07-22 ENCOUNTER — Encounter (HOSPITAL_COMMUNITY): Payer: Self-pay | Admitting: Cardiology

## 2017-07-22 ENCOUNTER — Encounter: Payer: Self-pay | Admitting: Cardiovascular Disease

## 2017-07-22 ENCOUNTER — Ambulatory Visit: Payer: Medicare Other | Admitting: Cardiovascular Disease

## 2017-07-22 ENCOUNTER — Ambulatory Visit (HOSPITAL_COMMUNITY)
Admission: RE | Admit: 2017-07-22 | Discharge: 2017-07-22 | Disposition: A | Discharge status: Home / Self Care | Payer: Medicare Other | Source: Ambulatory Visit | Attending: Cardiology | Admitting: Cardiology

## 2017-07-22 VITALS — BP 106/70 | HR 68 | Ht 60.0 in | Wt 150.0 lb

## 2017-07-22 VITALS — BP 105/60 | HR 86 | Wt 151.0 lb

## 2017-07-22 DIAGNOSIS — E041 Nontoxic single thyroid nodule: Secondary | ICD-10-CM | POA: Diagnosis not present

## 2017-07-22 DIAGNOSIS — E039 Hypothyroidism, unspecified: Secondary | ICD-10-CM | POA: Diagnosis not present

## 2017-07-22 DIAGNOSIS — I272 Pulmonary hypertension, unspecified: Secondary | ICD-10-CM | POA: Diagnosis not present

## 2017-07-22 DIAGNOSIS — I5032 Chronic diastolic (congestive) heart failure: Secondary | ICD-10-CM

## 2017-07-22 DIAGNOSIS — I4891 Unspecified atrial fibrillation: Secondary | ICD-10-CM | POA: Diagnosis not present

## 2017-07-22 DIAGNOSIS — M1611 Unilateral primary osteoarthritis, right hip: Secondary | ICD-10-CM | POA: Diagnosis not present

## 2017-07-22 DIAGNOSIS — Z79899 Other long term (current) drug therapy: Secondary | ICD-10-CM | POA: Insufficient documentation

## 2017-07-22 DIAGNOSIS — I481 Persistent atrial fibrillation: Secondary | ICD-10-CM | POA: Diagnosis not present

## 2017-07-22 DIAGNOSIS — N183 Chronic kidney disease, stage 3 (moderate): Secondary | ICD-10-CM | POA: Diagnosis not present

## 2017-07-22 DIAGNOSIS — G4733 Obstructive sleep apnea (adult) (pediatric): Secondary | ICD-10-CM | POA: Insufficient documentation

## 2017-07-22 DIAGNOSIS — I130 Hypertensive heart and chronic kidney disease with heart failure and stage 1 through stage 4 chronic kidney disease, or unspecified chronic kidney disease: Secondary | ICD-10-CM | POA: Insufficient documentation

## 2017-07-22 DIAGNOSIS — I2721 Secondary pulmonary arterial hypertension: Secondary | ICD-10-CM | POA: Insufficient documentation

## 2017-07-22 DIAGNOSIS — I480 Paroxysmal atrial fibrillation: Secondary | ICD-10-CM | POA: Diagnosis not present

## 2017-07-22 DIAGNOSIS — Z7901 Long term (current) use of anticoagulants: Secondary | ICD-10-CM | POA: Diagnosis not present

## 2017-07-22 DIAGNOSIS — I4819 Other persistent atrial fibrillation: Secondary | ICD-10-CM

## 2017-07-22 LAB — BASIC METABOLIC PANEL
Anion gap: 9 (ref 5–15)
BUN: 30 mg/dL — ABNORMAL HIGH (ref 6–20)
CO2: 24 mmol/L (ref 22–32)
Calcium: 8.9 mg/dL (ref 8.9–10.3)
Chloride: 105 mmol/L (ref 101–111)
Creatinine, Ser: 1.88 mg/dL — ABNORMAL HIGH (ref 0.44–1.00)
GFR calc Af Amer: 29 mL/min — ABNORMAL LOW (ref >60)
GFR calc non Af Amer: 25 mL/min — ABNORMAL LOW (ref >60)
Glucose, Bld: 81 mg/dL (ref 65–99)
Potassium: 3.9 mmol/L (ref 3.5–5.1)
Sodium: 138 mmol/L (ref 135–145)

## 2017-07-22 MED ORDER — METOPROLOL SUCCINATE ER 25 MG PO TB24: 25.0000 mg | ORAL_TABLET | Freq: Every day | ORAL | 3 refills | Status: DC

## 2017-07-22 NOTE — Patient Instructions (Signed)
Labs today (will call for abnormal results, otherwise no news is good news)  INCREASE Toprol-XL to 25 mg (1 Tablet) Once Daily  Follow up as scheduled in 3 Months

## 2017-07-22 NOTE — Progress Notes (Signed)
Cardiology Office Note    Date:  07/24/2017   ID:  Tiffany [name], DOB 1945-11-15, MRN [id number]  PCP:  [name], MD  Cardiologist:  Shelva Majestic, MD (sleep); Dr. [name]  F/U sleep evaluation  History of Present Illness:  Tiffany [name] is a 71 y.o. female who presents for one-year follow-up sleep clinic following initiation of CPAP therapy.  She is followed by Dr. [name].  Tiffany [name] has a history of paroxysmal atrial fibrillation and diastolic heart failure and was found to have pulmonary hypertension.  She admits to dyspnea.  She had developed an episode of atrial fibrillation and underwent cardioversion in June 2016.  She was started on amiodarone but this was stopped secondary to dyspnea.  A nuclear perfusion study in September 2016 did not reveal ischemia or infarction.  She was found to have elevated left and right heart filling pressures as well as pulmonary hypertension at catheterization and her PA pressure was increased up to 85 mm on echo.  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  Because of her pulmonary hypertension, she was referred for a sleep study which was done on 04/10/2016.  She met split-night criteria and was found to have severe obstructive sleep apnea with an HIF 59.4 per hour on the diagnostic study.  She did not have evidence for central apneic events.  Oxygen desaturated to a nadir of 80%.  There was moderate snoring.  She underwent CPAP titration and a 13 cm water pressure was recommended.   CPAP was initiated on 05/18/2016.  When I saw her for initial evaluation, a download obtained from 06/13/2016 through 07/12/2016 demonstrated 100% compliance abuse and usage greater than 4 hours. She was averaging 9 hours and 13 minutes of sleep per night.  At 13 cmwater pressure, AHI was  2.5.   Over the past year, she has continued to use CPAP with 1% compliance.  A new download was obtained from 06/21/2017 through 07/20/2017.   This confirms 100% compliance.  She is averaging 9 hours and 26 minutes of sleep per night.  At her 13 cm water pressure, AHI is excellent at 2.7.  There is no significant leak.  She is sleeping well.  She denies any daytime sleepiness.  She is unaware of any breakthrough snoring.  In the office today.  I recalculated an Epworth Sleepiness Scale score, and this was excellent and endorsed at 1.   She last saw Dr. [name] advanced heart failure clinic in November 2018.  She has a history of atrial fibrillation and pulmonary hypertension with diastolic heart failure.  She had undergone a right heart catheterization.  Her pulmonary pressures have significant improved with CPAP treatment.  She presents for evaluation.   Past Medical History:  Diagnosis Date  . Arthritis 04-20-12   Osteoarthritis-right hip  . Atrial fibrillation status post cardioversion, 01/30/15 maintaining SR.  01/31/2015   a. 01/2015 s/p TEE/DCCV;  b. 02/06/2015 recurrent AF noted, amio added;  c. CHA2DS2VASc = 3-->eliquis.  Marland Kitchen BENIGN POSITIONAL VERTIGO   . Cataract   . Complication of anesthesia 04-20-12   some issues with prolonged sedation after anesthesia  . Diverticulosis   . DYSLIPIDEMIA   . Esophageal stricture   . GERD   . HYPERTENSION    a. severe, pt intol of med tx, Pt. has severe "whitecoat" syndrome and refused medical therapy.  . Hypothyroidism 09/29/2014   a. pt did not tolerate synthroid and this was  subsequently discontinued.  . Mild LV dysfunction    a. 01/2015 Echo: EF 45-50%, mild MR, mildly dil LA, mod dil RV with mod to sev reduced fxn, mod-sev TR, PASP 63mHg.  . Osteopenia   . Raynaud disease   . URINARY INCONTINENCE 04-20-12   occ. with nighttime sleep pattern    Past Surgical History:  Procedure Laterality Date  . ATRIAL FIBRILLATION ABLATION N/A 01/26/2017   Procedure: Atrial Fibrillation Ablation;  Surgeon: AThompson Grayer MD;  [facility];  Service: Cardiovascular;  Laterality: N/A;  .  breast ultrasound Right 09/13/13   There is no sonographic evidence of malignancy. the 02.8NOcomplicated cyst in (R) breast is consistent with a benign finding. repeat in 1 year  . CARDIAC CATHETERIZATION N/A 08/29/2015   Procedure: Right Heart Cath;  Surgeon: DLarey Dresser MD;  [facility];  Service: Cardiovascular;  Laterality: N/A;  . CARDIOVERSION N/A 01/30/2015   Procedure: CARDIOVERSION;  Surgeon: MSanda Klein MD;  [facility];  Service: Cardiovascular;  Laterality: N/A;  . CHOLECYSTECTOMY     '90-"sludge"  . NASAL FRACTURE SURGERY  2006  . RIGHT HEART CATH N/A 06/30/2017   Procedure: RIGHT HEART CATH;  Surgeon: MLarey Dresser MD;  [facility];  Service: Cardiovascular;  Laterality: N/A;  . TEE WITHOUT CARDIOVERSION N/A 01/30/2015   Procedure: TRANSESOPHAGEAL ECHOCARDIOGRAM (TEE);  Surgeon: MSanda Klein MD;  [facility];  Service: Cardiovascular;  Laterality: N/A;  . TEE WITHOUT CARDIOVERSION N/A 01/25/2017   Procedure: TRANSESOPHAGEAL ECHOCARDIOGRAM (TEE);  Surgeon: RFay Records MD;  [facility];  Service: Cardiovascular;  Laterality: N/A;  . TOTAL HIP ARTHROPLASTY  04/26/2012   Procedure: TOTAL HIP ARTHROPLASTY ANTERIOR APPROACH;  Surgeon: MMauri Pole MD;  [facility]: [facility];  Service: Orthopedics;  Laterality: Right;  . TUBAL LIGATION  1980    Current Medications: Outpatient Medications Prior to Visit  Medication Sig Dispense Refill  . acetaminophen (TYLENOL) 325 MG tablet Take 2 tablets (650 mg total) by mouth every 4 (four) hours as needed for headache or mild pain.    .Marland Kitchenapixaban (ELIQUIS) 5 MG TABS tablet Take 1 tablet (5 mg total) by mouth 2 (two) times daily. 180 tablet 1  . conjugated estrogens (PREMARIN) vaginal cream Place 1 Applicatorful every Saturday vaginally. Using 1.5 grams weekly    . diazepam (VALIUM) 5 MG tablet TAKE 1 TABLET EVERY 8 HOURS AS NEEDED FOR ANXIETY 30 tablet 2  . famotidine  (PEPCID) 20 MG tablet Take 20 mg by mouth daily at 12 noon.     . fluticasone (FLONASE) 50 MCG/ACT nasal spray Place 1 spray daily as needed into both nostrils for allergies or rhinitis.    . furosemide (LASIX) 40 MG tablet TAKE 2 TABLETS BY MOUTH EVERY MORNING AND 1 TABLET EVERY EVENING (Patient taking differently: TAKE 2 TABLETS BY MOUTH EVERY MORNING AND 1 TABLET EVERY AFTERNOON.) 270 tablet 3  . Macitentan (OPSUMIT) 10 MG TABS Take 1 tablet (10 mg total) by mouth daily. (Patient taking differently: Take 10 mg daily at 12 noon by mouth. MID-AFTERNOON) 30 tablet 11  . metoprolol succinate (TOPROL-XL) 25 MG 24 hr tablet Take 1 tablet (25 mg total) by mouth daily. Take with or immediately following a meal. 30 tablet 3  . potassium chloride SA (KLOR-CON M20) 20 MEQ tablet Take 2 tablets (40 mEq total) by mouth daily. 60 tablet 3  . Probiotic Product (PROBIOTIC-10) CAPS Take 1 capsule by mouth  daily.     . Riociguat (ADEMPAS) 2.5 MG TABS Take 2.5 mg by mouth 3 (three) times daily. 90 tablet 3  . Selexipag (UPTRAVI) 600 MCG TABS Take 600 mcg by mouth 2 (two) times daily. 60 tablet 11  . sertraline (ZOLOFT) 50 MG tablet Take 1 tablet (50 mg total) daily by mouth. 30 tablet 3   No facility-administered medications prior to visit.      Allergies:   Levothyroxine; Statins; Prednisone; and Penicillins   Social History   Socioeconomic History  . Marital status: Married    Spouse name: None  . Number of children: 2  . Years of education: None  . Highest education level: None  Social Needs  . Financial resource strain: None  . Food insecurity - worry: None  . Food insecurity - inability: None  . Transportation needs - medical: None  . Transportation needs - non-medical: None  Occupational History  . None  Tobacco Use  . Smoking status: Never Smoker  . Smokeless tobacco: Never Used  Substance and Sexual Activity  . Alcohol use: Yes    Alcohol/week: 1.2 oz    Types: 2 Glasses of wine per  week    Comment: several glasses wine weekly  . Drug use: No  . Sexual activity: Yes  Other Topics Concern  . None  Social History Narrative   She enjoys birding.  Lives at home with husband.   Married.   retired Quarry manager, Tax inspector     Family History:  The patient's family history includes Other in her unknown relative. She was adopted.   ROS General: Negative; No fevers, chills, or night sweats;  HEENT: Negative; No changes in vision or hearing, sinus congestion, difficulty swallowing Pulmonary: Negative; No cough, wheezing, shortness of breath, hemoptysis Cardiovascular: History of diastolic heart failure, pulmonary hypertension GI: Negative; No nausea, vomiting, diarrhea, or abdominal pain GU: Negative; No dysuria, hematuria, or difficulty voiding Musculoskeletal: Negative; no myalgias, joint pain, or weakness Hematologic/Oncology: Negative; no easy bruising, bleeding Endocrine: Negative; no heat/cold intolerance; no diabetes Neuro: Negative; no changes in balance, headaches Skin: Negative; No rashes or skin lesions Psychiatric: Negative; No behavioral problems, depression Sleep: Positive for severe sleep apnea.  Since initiating CPAP therapy there is no residual snoring, daytime sleepiness, hypersomnolence, bruxism, restless legs, hypnogognic hallucinations, no cataplexy Other comprehensive 14 point system review is negative.   PHYSICAL EXAM:   VS:  BP 106/70   Pulse 68   Ht 5' (1.524 m)   Wt 150 lb (68 kg)   BMI 29.29 kg/m     Repeat blood pressure by me was 102/62  Wt Readings from Last 3 Encounters:  07/22/17 151 lb (68.5 kg)  07/22/17 150 lb (68 kg)  06/30/17 145 lb (65.8 kg)    General: Alert, oriented, no distress.  Skin: normal turgor, no rashes, warm and dry HEENT: Normocephalic, atraumatic. Pupils equal round and reactive to light; sclera anicteric; extraocular muscles intact;  Nose without nasal septal  hypertrophy Mouth/Parynx benign; Mallinpatti scale 3 Neck: No JVD, no carotid bruits; normal carotid upstroke Lungs: clear to ausculatation and percussion; no wheezing or rales Chest wall: without tenderness to palpitation Heart: PMI not displaced, RRR, s1 s2 normal, 1/6 systolic murmur, no diastolic murmur, no rubs, gallops, thrills, or heaves Abdomen: soft, nontender; no hepatosplenomehaly, BS+; abdominal aorta nontender and not dilated by palpation. Back: no CVA tenderness Pulses 2+ Musculoskeletal: full range of motion, normal strength, no joint deformities Extremities: no clubbing  cyanosis or edema, Homan's sign negative  Neurologic: grossly nonfocal; Cranial nerves grossly wnl Psychologic: Normal mood and affect   Studies/Labs Reviewed:   EKG:  EKG is not  ordered today.  An independent review of her last ECG from 08/08/2015 reveals normal sinus rhythm at 70 bpm.  There is left axis deviation and incomplete right bundle branch block.  Recent Labs: BMP Latest Ref Rng & Units 07/22/2017 06/30/2017 06/30/2017  Glucose 65 - 99 mg/dL 81 94 96  BUN 6 - 20 mg/dL 30(H) 24(H) 23(H)  Creatinine 0.44 - 1.00 mg/dL 1.88(H) 1.80(H) 1.71(H)  BUN/Creat Ratio 12 - 28 - - -  Sodium 135 - 145 mmol/L 138 141 137  Potassium 3.5 - 5.1 mmol/L 3.9 3.1(L) 3.2(L)  Chloride 101 - 111 mmol/L 105 104 106  CO2 22 - 32 mmol/L 24 - 24  Calcium 8.9 - 10.3 mg/dL 8.9 - 8.8(L)     Hepatic Function Latest Ref Rng & Units 12/09/2016 09/30/2015 08/08/2015  Total Protein 6.5 - 8.1 g/dL 7.1 7.3 6.3(L)  Albumin 3.5 - 5.0 g/dL 3.9 3.9 3.1(L)  AST 15 - 41 U/L _0 ALT 14 - 54 U/L 12(L) 25 30  Alk Phosphatase 38 - 126 U/L 86 63 71  Total Bilirubin 0.3 - 1.2 mg/dL 0.8 0.8 0.9  Bilirubin, Direct 0.0 - 0.3 mg/dL - - -    CBC Latest Ref Rng & Units 06/30/2017 06/30/2017 06/15/2017  WBC 4.0 - 10.5 K/uL - 4.4 4.8  Hemoglobin 12.0 - 15.0 g/dL 11.6(L) 11.2(L) 12.7  Hematocrit 36.0 - 46.0 % 34.0(L) 34.8(L) 38.7   Platelets 150 - 400 K/uL - 161 225   Lab Results  Component Value Date   MCV 89.9 06/30/2017   MCV 91.5 06/15/2017   MCV 90 01/12/2017   Lab Results  Component Value Date   TSH 7.81 (H) 07/15/2015   Lab Results  Component Value Date   HGBA1C 5.3 02/13/2015     BNP    Component Value Date/Time   BNP 112.9 (H) 04/13/2017 1647    ProBNP    Component Value Date/Time   PROBNP 880.0 (H) 09/30/2015 1055     Lipid Panel     Component Value Date/Time   CHOL 223 (H) 12/09/2016 1144   TRIG 204 (H) 12/09/2016 1144   TRIG 185 02/11/2009   HDL 62 12/09/2016 1144   CHOLHDL 3.6 12/09/2016 1144   VLDL 41 (H) 12/09/2016 1144   LDLCALC 120 (H) 12/09/2016 1144     RADIOLOGY: No results found.   Additional studies/ records that were reviewed today include:  I review the office records of Dr. [name], the patient's sleep study, ECG, and obtained a new download  download.    ASSESSMENT:    1. OSA (obstructive sleep apnea)   2. Atrial fibrillation status post cardioversion, 01/30/15 maintaining SR.    3. Pulmonary hypertension (HCC)   4. Persistent atrial fibrillation ([place])   5. Chronic diastolic CHF (congestive heart failure) [facility])      PLAN:  Tiffany [name] is a very pleasant 71 year old female who has a history of paroxysmal atrial fibrillation and diastolic heart failure and was found to have significant pulmonary hypertension.  Her sleep study in detail with her demonstrated severe sleep apnea and significant oxygen desaturation to a nadir of 80%.  On her initial study, she had reduced sleep efficiency and moderate snoring.  She has been on CPAP therapy at 13 cm and is 100% compliance with reference  to usage stays as well as usage greater than 4 hours.  Her most recent download, she continues to have 100% compliance.  She is averaging 9 hours and 26 minutes of sleep per night, and at 13 cm water pressure.  AHI is excellent at 2.7.  She does not have any residual  sleepiness and her Epworth Sleepiness Scale score today endorsed at 1.  She is sleeping well.  Her sleep is restorative.  Her pulmonary pressures have improved and on last testing were down to 36 on retesting compared to her pretreatment levels.  She is on medication for pulmonary hypertension.  Her blood pressure today is excellent on Toprol-XL 25 mg, furosemide.  She is on anticoagulation with eliquis for her atrial fibrillation.  She has a history of Raynaud phenomenon.  From a sleep perspective, I will see her in one year for reevaluation.  She will follow up with Dr. [name] for cardiology and heart failure reassessment.  Medication Adjustments/Labs and Tests Ordered: Current medicines are reviewed at length with the patient today.  Concerns regarding medicines are outlined above.  Medication changes, Labs and Tests ordered today are listed in the Patient Instructions below.  Patient Instructions  Your physician recommends that you continue on your current medications as directed. Please refer to the Current Medication list given to you today.  Your physician wants you to follow-up in: [place] will receive a reminder letter in the mail two months in advance. If you don't receive a letter, please call our office to schedule the follow-up appointment.    Time spent: 30 minutes Signed, Shelva Majestic, MD  07/24/2017 7:43 PM    [facility] [address] [place], [place], [place]  [address] Phone: [phone]       Cardiology Office Note    Date:  07/24/2017   ID:  Tiffany [name], DOB 22-Dec-1945, MRN [id number]  PCP:  [name], MD  Cardiologist:  Shelva Majestic, MD (sleep); Dr. [name]  New sleep evaluation  History of Present Illness:  Tiffany [name] is a 71 y.o. female who presents for sleep clinic following initiation of CPAP therapy.  She is followed by Dr. [name].  Tiffany [name] has a history of paroxysmal atrial fibrillation  and diastolic heart failure and was found to have pulmonary hypertension.  She admits to dyspnea.  She had developed an episode of atrial fibrillation and underwent cardioversion in June 2016.  She was started on amiodarone but this was stopped secondary to dyspnea.  A nuclear perfusion study in September 2016 did not reveal ischemia or infarction.  She was found to have elevated left and right heart filling pressures as well as pulmonary hypertension at catheterization and her PA pressure was increased up to 85 mm on echo.  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  Because of her pulmonary hypertension, she was referred for a sleep study which was done on 04/10/2016.  She met split-night criteria and was found to have severe obstructive sleep apnea with an HIF 59.4 per hour on the diagnostic study.  She did not have evidence for central apneic events.  Oxygen desaturated to a nadir of 80%.  There was moderate snoring.  She underwent CPAP titration and a 13 cm water pressure was recommended.  Her CPAP was set up following her CPAP titration.  A download obtained from 06/13/2016 through 07/12/2016 shows 100% compliance abuse and  usage greater than 4 hours.  She is averaging 9 hours and 13 minutes of sleep per night.  At 13.  Senna meter water pressure, AHI is 2.5.  She is sleeping well.  She is unaware of breakthrough snoring.  She denies frequent awakenings.  There is no daytime sleepiness.  In a form sleepiness scale was calculated and endorsed at 2 shown below.  Epworth Sleepiness Scale: Situation   Chance of Dozing/Sleeping (0 = never , 1 = slight chance , 2 = moderate chance , 3 = high chance )   sitting and reading 1   watching TV 0   sitting inactive in a public place 0   being a passenger in a motor vehicle for an hour or more 0   lying down in the afternoon 1   sitting and talking to someone 0   sitting quietly after lunch (no alcohol) 0   while stopped for a  few minutes in traffic as the driver 0   Total Score  2      Past Medical History:  Diagnosis Date  . Arthritis 04-20-12   Osteoarthritis-right hip  . Atrial fibrillation status post cardioversion, 01/30/15 maintaining SR.  01/31/2015   a. 01/2015 s/p TEE/DCCV;  b. 02/06/2015 recurrent AF noted, amio added;  c. CHA2DS2VASc = 3-->eliquis.  Marland Kitchen BENIGN POSITIONAL VERTIGO   . Cataract   . Complication of anesthesia 04-20-12   some issues with prolonged sedation after anesthesia  . Diverticulosis   . DYSLIPIDEMIA   . Esophageal stricture   . GERD   . HYPERTENSION    a. severe, pt intol of med tx, Pt. has severe "whitecoat" syndrome and refused medical therapy.  . Hypothyroidism 09/29/2014   a. pt did not tolerate synthroid and this was subsequently discontinued.  . Mild LV dysfunction    a. 01/2015 Echo: EF 45-50%, mild MR, mildly dil LA, mod dil RV with mod to sev reduced fxn, mod-sev TR, PASP 6mHg.  . Osteopenia   . Raynaud disease   . URINARY INCONTINENCE 04-20-12   occ. with nighttime sleep pattern    Past Surgical History:  Procedure Laterality Date  . ATRIAL FIBRILLATION ABLATION N/A 01/26/2017   Procedure: Atrial Fibrillation Ablation;  Surgeon: AThompson Grayer MD;  [facility];  Service: Cardiovascular;  Laterality: N/A;  . breast ultrasound Right 09/13/13   There is no sonographic evidence of malignancy. the 08.3ANcomplicated cyst in (R) breast is consistent with a benign finding. repeat in 1 year  . CARDIAC CATHETERIZATION N/A 08/29/2015   Procedure: Right Heart Cath;  Surgeon: DLarey Dresser MD;  [facility];  Service: Cardiovascular;  Laterality: N/A;  . CARDIOVERSION N/A 01/30/2015   Procedure: CARDIOVERSION;  Surgeon: MSanda Klein MD;  [facility];  Service: Cardiovascular;  Laterality: N/A;  . CHOLECYSTECTOMY     '90-"sludge"  . NASAL FRACTURE SURGERY  2006  . RIGHT HEART CATH N/A 06/30/2017   Procedure: RIGHT HEART CATH;   Surgeon: MLarey Dresser MD;  [facility];  Service: Cardiovascular;  Laterality: N/A;  . TEE WITHOUT CARDIOVERSION N/A 01/30/2015   Procedure: TRANSESOPHAGEAL ECHOCARDIOGRAM (TEE);  Surgeon: MSanda Klein MD;  [facility];  Service: Cardiovascular;  Laterality: N/A;  . TEE WITHOUT CARDIOVERSION N/A 01/25/2017   Procedure: TRANSESOPHAGEAL ECHOCARDIOGRAM (TEE);  Surgeon: RFay Records MD;  [facility];  Service: Cardiovascular;  Laterality: N/A;  . TOTAL HIP ARTHROPLASTY  04/26/2012   Procedure: TOTAL  HIP ARTHROPLASTY ANTERIOR APPROACH;  Surgeon: Mauri Pole, MD;  [facility];  Service: Orthopedics;  Laterality: Right;  . TUBAL LIGATION  1980    Current Medications: Outpatient Medications Prior to Visit  Medication Sig Dispense Refill  . acetaminophen (TYLENOL) 325 MG tablet Take 2 tablets (650 mg total) by mouth every 4 (four) hours as needed for headache or mild pain.    Marland Kitchen apixaban (ELIQUIS) 5 MG TABS tablet Take 1 tablet (5 mg total) by mouth 2 (two) times daily. 180 tablet 1  . conjugated estrogens (PREMARIN) vaginal cream Place 1 Applicatorful every Saturday vaginally. Using 1.5 grams weekly    . diazepam (VALIUM) 5 MG tablet TAKE 1 TABLET EVERY 8 HOURS AS NEEDED FOR ANXIETY 30 tablet 2  . famotidine (PEPCID) 20 MG tablet Take 20 mg by mouth daily at 12 noon.     . fluticasone (FLONASE) 50 MCG/ACT nasal spray Place 1 spray daily as needed into both nostrils for allergies or rhinitis.    . furosemide (LASIX) 40 MG tablet TAKE 2 TABLETS BY MOUTH EVERY MORNING AND 1 TABLET EVERY EVENING (Patient taking differently: TAKE 2 TABLETS BY MOUTH EVERY MORNING AND 1 TABLET EVERY AFTERNOON.) 270 tablet 3  . Macitentan (OPSUMIT) 10 MG TABS Take 1 tablet (10 mg total) by mouth daily. (Patient taking differently: Take 10 mg daily at 12 noon by mouth. MID-AFTERNOON) 30 tablet 11  . metoprolol succinate (TOPROL-XL) 25 MG 24 hr tablet Take 1 tablet (25 mg  total) by mouth daily. Take with or immediately following a meal. 30 tablet 3  . potassium chloride SA (KLOR-CON M20) 20 MEQ tablet Take 2 tablets (40 mEq total) by mouth daily. 60 tablet 3  . Probiotic Product (PROBIOTIC-10) CAPS Take 1 capsule by mouth daily.     . Riociguat (ADEMPAS) 2.5 MG TABS Take 2.5 mg by mouth 3 (three) times daily. 90 tablet 3  . Selexipag (UPTRAVI) 600 MCG TABS Take 600 mcg by mouth 2 (two) times daily. 60 tablet 11  . sertraline (ZOLOFT) 50 MG tablet Take 1 tablet (50 mg total) daily by mouth. 30 tablet 3   No facility-administered medications prior to visit.      Allergies:   Levothyroxine; Statins; Prednisone; and Penicillins   Social History   Socioeconomic History  . Marital status: Married    Spouse name: None  . Number of children: 2  . Years of education: None  . Highest education level: None  Social Needs  . Financial resource strain: None  . Food insecurity - worry: None  . Food insecurity - inability: None  . Transportation needs - medical: None  . Transportation needs - non-medical: None  Occupational History  . None  Tobacco Use  . Smoking status: Never Smoker  . Smokeless tobacco: Never Used  Substance and Sexual Activity  . Alcohol use: Yes    Alcohol/week: 1.2 oz    Types: 2 Glasses of wine per week    Comment: several glasses wine weekly  . Drug use: No  . Sexual activity: Yes  Other Topics Concern  . None  Social History Narrative   She enjoys birding.  Lives at home with husband.   Married.   retired Quarry manager, Tax inspector     Family History:  The patient's family history includes Other in her unknown relative. She was adopted.   ROS General: Negative; No fevers, chills, or night sweats;  HEENT: Negative; No changes in vision or  hearing, sinus congestion, difficulty swallowing Pulmonary: Negative; No cough, wheezing, shortness of breath, hemoptysis Cardiovascular: Negative; No chest pain,  presyncope, syncope, palpitations GI: Negative; No nausea, vomiting, diarrhea, or abdominal pain GU: Negative; No dysuria, hematuria, or difficulty voiding Musculoskeletal: Negative; no myalgias, joint pain, or weakness Hematologic/Oncology: Negative; no easy bruising, bleeding Endocrine: Negative; no heat/cold intolerance; no diabetes Neuro: Negative; no changes in balance, headaches Skin: Negative; No rashes or skin lesions Psychiatric: Negative; No behavioral problems, depression Sleep: Positive for severe sleep apnea.  Since initiating CPAP therapy there is no residual snoring, daytime sleepiness, hypersomnolence, bruxism, restless legs, hypnogognic hallucinations, no cataplexy Other comprehensive 14 point system review is negative.   PHYSICAL EXAM:   VS:  BP 106/70   Pulse 68   Ht 5' (1.524 m)   Wt 150 lb (68 kg)   BMI 29.29 kg/m    Wt Readings from Last 3 Encounters:  07/22/17 151 lb (68.5 kg)  07/22/17 150 lb (68 kg)  06/30/17 145 lb (65.8 kg)    General: Alert, oriented, no distress.  Skin: normal turgor, no rashes, warm and dry HEENT: Normocephalic, atraumatic. Pupils equal round and reactive to light; sclera anicteric; extraocular muscles intact; Fundi Without hemorrhages or exudates. Nose without nasal septal hypertrophy Mouth/Parynx benign; Mallinpatti scale 3 Neck: No JVD, no carotid bruits; normal carotid upstroke Lungs: clear to ausculatation and percussion; no wheezing or rales Chest wall: without tenderness to palpitation Heart: PMI not displaced, RRR, s1 s2 normal, 1/6 systolic murmur, no diastolic murmur, no rubs, gallops, thrills, or heaves Abdomen: soft, nontender; no hepatosplenomehaly, BS+; abdominal aorta nontender and not dilated by palpation. Back: no CVA tenderness Pulses 2+ Musculoskeletal: full range of motion, normal strength, no joint deformities Extremities: no clubbing cyanosis or edema, Homan's sign negative  Neurologic: grossly nonfocal;  Cranial nerves grossly wnl Psychologic: Normal mood and affect   Studies/Labs Reviewed:   ECG (independently read by me):  Sinus rhythm with PACs.  Poor progression V1 and V2.  Normal intervals.   An independent review of her last ECG from 08/08/2015 reveals normal sinus rhythm at 70 bpm.  There is left axis deviation and incomplete right bundle branch block.  Recent Labs: BMP Latest Ref Rng & Units 07/22/2017 06/30/2017 06/30/2017  Glucose 65 - 99 mg/dL 81 94 96  BUN 6 - 20 mg/dL 30(H) 24(H) 23(H)  Creatinine 0.44 - 1.00 mg/dL 1.88(H) 1.80(H) 1.71(H)  BUN/Creat Ratio 12 - 28 - - -  Sodium 135 - 145 mmol/L 138 141 137  Potassium 3.5 - 5.1 mmol/L 3.9 3.1(L) 3.2(L)  Chloride 101 - 111 mmol/L 105 104 106  CO2 22 - 32 mmol/L 24 - 24  Calcium 8.9 - 10.3 mg/dL 8.9 - 8.8(L)     Hepatic Function Latest Ref Rng & Units 12/09/2016 09/30/2015 08/08/2015  Total Protein 6.5 - 8.1 g/dL 7.1 7.3 6.3(L)  Albumin 3.5 - 5.0 g/dL 3.9 3.9 3.1(L)  AST 15 - 41 U/L _0 ALT 14 - 54 U/L 12(L) 25 30  Alk Phosphatase 38 - 126 U/L 86 63 71  Total Bilirubin 0.3 - 1.2 mg/dL 0.8 0.8 0.9  Bilirubin, Direct 0.0 - 0.3 mg/dL - - -    CBC Latest Ref Rng & Units 06/30/2017 06/30/2017 06/15/2017  WBC 4.0 - 10.5 K/uL - 4.4 4.8  Hemoglobin 12.0 - 15.0 g/dL 11.6(L) 11.2(L) 12.7  Hematocrit 36.0 - 46.0 % 34.0(L) 34.8(L) 38.7  Platelets 150 - 400 K/uL - 161 225   Lab Results  Component Value Date   MCV 89.9 06/30/2017   MCV 91.5 06/15/2017   MCV 90 01/12/2017   Lab Results  Component Value Date   TSH 7.81 (H) 07/15/2015   Lab Results  Component Value Date   HGBA1C 5.3 02/13/2015     BNP    Component Value Date/Time   BNP 112.9 (H) 04/13/2017 1647    ProBNP    Component Value Date/Time   PROBNP 880.0 (H) 09/30/2015 1055     Lipid Panel     Component Value Date/Time   CHOL 223 (H) 12/09/2016 1144   TRIG 204 (H) 12/09/2016 1144   TRIG 185 02/11/2009   HDL 62 12/09/2016 1144   CHOLHDL  3.6 12/09/2016 1144   VLDL 41 (H) 12/09/2016 1144   LDLCALC 120 (H) 12/09/2016 1144     RADIOLOGY: No results found.   Additional studies/ records that were reviewed today include:  I review the office records of Dr. [name], the patient's sleep study, ECG, and most recent download.    ASSESSMENT:    1. OSA (obstructive sleep apnea)   2. Atrial fibrillation status post cardioversion, 01/30/15 maintaining SR.    3. Pulmonary hypertension (HCC)   4. Persistent atrial fibrillation ([place])   5. Chronic diastolic CHF (congestive heart failure) [facility])      PLAN:  Tiffany [name] is a very pleasant 71 year old female who has a history of paroxysmal atrial fibrillation and diastolic heart failure and was found to have significant pulmonary hypertension.  I reviewed her sleep study in detail with her, which demonstrates severe sleep apnea and significant oxygen desaturation to a nadir of 80%.  On her initial study, she had reduced sleep efficiency and moderate snoring.  She has been on CPAP therapy at 13 cm and is 100% compliance with reference to usage stays as well as usage greater than 4 hours.  She is averaging 9 hours and 17 minutes of sleep per night.  She has noticed some dryness.  I have suggested she use nasal saline in her nares, which will also help her sinuses and use this prior to CPAP use at night and also use in the morning.  We discussed alteration of her humidification.  She is meeting compliance standards.  Discussed the role of untreated sleep apnea in cardiovascular disease and its association with mild primary hypertension.  I do not feel to severe pulmonary hypertension is related primarily to her obstructive sleep apnea.  She has Raynaud's disease and does not have definitive crest syndrome.  Her blood pressure today in the office was excellent at 116/74.  She has a ResMed AirFit 10 small mask.  Per Medicare requirements, I will see her in one year for  reevaluation.   Medication Adjustments/Labs and Tests Ordered: Current medicines are reviewed at length with the patient today.  Concerns regarding medicines are outlined above.  Medication changes, Labs and Tests ordered today are listed in the Patient Instructions below.  Patient Instructions  Your physician recommends that you continue on your current medications as directed. Please refer to the Current Medication list given to you today.  Your physician wants you to follow-up in: [place] will receive a reminder letter in the mail two months in advance. If you don't receive a letter, please call our office to schedule the follow-up appointment.    Time spent: 30 minutes Signed, Shelva Majestic, MD  07/24/2017 7:43 PM    [facility] [address]  [address], [place], [place]  [address] Phone: [phone]

## 2017-07-22 NOTE — Patient Instructions (Signed)
Your physician recommends that you continue on your current medications as directed. Please refer to the Current Medication list given to you today.  Your physician wants you to follow-up in: [place] will receive a reminder letter in the mail two months in advance. If you don't receive a letter, please call our office to schedule the follow-up appointment.

## 2017-07-22 NOTE — Progress Notes (Signed)
Patient ID: Tiffany [name], female   DOB: 05-08-1946, 71 y.o.   MRN: [id number] PCP: Dr. Sharlet Salina HF Cardiology: Dr. [name]  71 y.o. with history of paroxysmal atrial fibrillation and diastolic CHF was noted to have significant pulmonary hypertension and exertional dyspnea.  She has history of paroxysmal atrial fibrillation and had an episode in 6/16 requiring cardioversion.  She was started on amiodarone but this was stopped with worsening breathing.  Cardiolite in 9/16 showed on ischemia or infarction.     I had her do a RHC in 1/17.  This showed elevated left and right heart filling pressures but also PAH. After cath, she increased her Lasix from 40 qod to 40 daily.  At a prior appointment, I increased Lasix to 40 mg bid and also started her on Opsumit.  At next appointment, I added Adcirca 20 and then titrated it up to 40 mg daily. She continue to be volume overloaded, so  I increased Lasix to 80 qam/40 qpm. She has seemed to do well on this dose.   Echo was done in 6/17, RV mildly dilated with moderately decreased systolic function and PASP 85 mmHg.    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   She had an atrial fibrillation ablation in 6/18.   At last visit, she was feeling worse.  Increased exertional dyspnea and also feeling palpitations. She was very anxious.  I started her on sertraline. RHC in 11/18 showed lower PA pressure than in the past with PA 54/16 and PVR 3.7 WU.  Echo showed preserved LV systolic function and normal RV size and function.  Holter showed PACs and PVCs, no atrial fibrillation.  I started her on Toprol XL 12.5 mg daily (low dose given prior problems with beta blockers).  CT chest did not show any significant lung findings.   She returns today for followup of diastolic CHF and pulmonary hypertension. She seems to be doing better overall.  Still more short of breath than this summer but feels like her breathing is improving.  She can walk  about 1000 feet in 5 minutes and then feels short of breath.    6 minute walk (2/17): 141 m 6 minute walk (3/17): 182 m 6 minute walk (5/17): 229 m 6 minute walk (10/17): 305 m 6 minute walk (5/18): 256 m 6 minute walk (8/18): 427 m 6 minute walk (11/18): 61 m, oxygen saturation dropped as low as 70s.   Labs (12/16): BNP 1187, ANA 1:640, TSH elevated. Labs (2/17): K 5.4 => 3.9, creatinine 1.78 => 1.66, LDL 112, BNP 880 Labs (4/17): K 4.5, creatinine 1.76 Labs (5/17): K 4.1, creatinine 1.79 Labs (8/17): K 3.1, creatinine 1.7, BNP 404 Labs (9/17): K 3.9, creatinine 1.8, BNP 512 Labs (2/18): K 3.5, creatinine 1.71, BNP 157 Labs (5/18): LDL 120 Labs (6/18): K 3.6, creatinine 1.85 Labs (8/18): K 3.5, creatinine 1.78, BNP 88 Labs (11/18): K 3.6 => 3.2, creatinine 1.84 => 1.7, hgb 12.7 => 11.2.  PMH:  1. CKD stage III 2. Bradycardia in setting of metoprolol + amiodarone use.  3. Raynauds phenomenon. 4. BPPV 5. OA right hip 6. Hypothyroidism 7. Atrial fibrillation: Paroxysmal.  She was on amiodarone in the past but this was stopped when she became more short of breath.  TEE-guided DCCV in 6/16.  - Atrial fibrillation ablation 6/18.  - Holter (12/18) with PACs/PVCs, no atrial fibrillation.  8. HTN 9. Chronic diastolic CHF with prominent RV failure: She has  pulmonary arterial HTN that contributes to RV failure, but there is also a component of diastolic CHF with elevated PCWP on RHC. - TEE (6/16) with EF 45-50%, mildly dilated RV with mildly decreased systolic function, PASP 36 mmHg.  - RHC (1/17) with mean RA 9, PA 80/29 mean 52, mean PCWP 27, CI 1.97 Fick/1.8 thermo, PVR 7.6 Fick/8.4 thermo.  - Echo (6/17): EF 60-65%, mild MR, mild RV dilation with moderately decreased systolic function, moderate TR, PASP 85 mmHg.  - Echo (6/18): EF 10-62%, normal diastolic function, normal RV size and systolic function, PASP 36 mmHg. - Echo (12/18): EF 60-65%, grade II diastolic dysfunction, mild MR,  normal RV size and systolic function, PASP 52 mmHg.  - RHC (11/18): mean RA 2, PA 54/16 mean 32, mean PCWP 9, CI 3.83, PVR 3.7 WU.  10. Pulmonary hypertension: See RHC above.  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  - TEE (6/16) with mildly dilated RV with mildly decreased systolic function. - ANA 6:948, anti-centromere antibody elevated, anti-SCL-70 antibody negative, h/o Raynauds - PFTs (7/16) with minimal obstructive defect but moderately decreased DLCO.  - V/Q scan (2/17) negative for acute or chronic PE.  11. Cardiolite (9/16) with no ischemia/infarction.  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  13. Subclinical hypothyroidism.  14. OSA: Severe on 9/17 sleep study. Now using CPAP.   SH: Married, lives in [place], no smoking, no ETOH.   FH: Adopted.  Daughter has Raynaud's.  ROS: All systems reviewed and negative except as per HPI  Current Outpatient Medications  Medication Sig Dispense Refill  . acetaminophen (TYLENOL) 325 MG tablet Take 2 tablets (650 mg total) by mouth every 4 (four) hours as needed for headache or mild pain.    Marland Kitchen apixaban (ELIQUIS) 5 MG TABS tablet Take 1 tablet (5 mg total) by mouth 2 (two) times daily. 180 tablet 1  . conjugated estrogens (PREMARIN) vaginal cream Place 1 Applicatorful every Saturday vaginally. Using 1.5 grams weekly    . diazepam (VALIUM) 5 MG tablet TAKE 1 TABLET EVERY 8 HOURS AS NEEDED FOR ANXIETY 30 tablet 2  . famotidine (PEPCID) 20 MG tablet Take 20 mg by mouth daily at 12 noon.     . fluticasone (FLONASE) 50 MCG/ACT nasal spray Place 1 spray daily as needed into both nostrils for allergies or rhinitis.    . furosemide (LASIX) 40 MG tablet TAKE 2 TABLETS BY MOUTH EVERY MORNING AND 1 TABLET EVERY EVENING (Patient taking differently: TAKE 2 TABLETS BY MOUTH EVERY MORNING AND 1 TABLET EVERY AFTERNOON.) 270 tablet 3  . Macitentan (OPSUMIT) 10 MG TABS Take 1 tablet (10 mg total) by mouth daily. (Patient taking differently: Take 10 mg daily at  12 noon by mouth. MID-AFTERNOON) 30 tablet 11  . metoprolol succinate (TOPROL-XL) 25 MG 24 hr tablet Take 1 tablet (25 mg total) by mouth daily. Take with or immediately following a meal. 30 tablet 3  . potassium chloride SA (KLOR-CON M20) 20 MEQ tablet Take 2 tablets (40 mEq total) by mouth daily. 60 tablet 3  . Probiotic Product (PROBIOTIC-10) CAPS Take 1 capsule by mouth daily.     . Riociguat (ADEMPAS) 2.5 MG TABS Take 2.5 mg by mouth 3 (three) times daily. 90 tablet 3  . Selexipag (UPTRAVI) 600 MCG TABS Take 600 mcg by mouth 2 (two) times daily. 60 tablet 11  . sertraline (ZOLOFT) 50 MG tablet Take 1 tablet (50 mg total) daily by mouth. 30 tablet 3   No current facility-administered  medications for this encounter.    BP 105/60 (BP Location: Left Arm, Patient Position: Sitting, Cuff Size: Normal)   Pulse 86   Wt 151 lb (68.5 kg)   SpO2 92%   BMI 29.49 kg/m   General: NAD Neck: No JVD, no thyromegaly or thyroid nodule.  Lungs: Clear to auscultation bilaterally with normal respiratory effort. CV: Nondisplaced PMI.  Heart regular S1/S2, no S3/S4, no murmur.  No peripheral edema.  No carotid bruit.  Normal pedal pulses.  Abdomen: Soft, nontender, no hepatosplenomegaly, no distention.  Skin: Intact without lesions or rashes.  Neurologic: Alert and oriented x 3.  Psych: Normal affect. Extremities: No clubbing or cyanosis.  HEENT: Normal.   Assessment/Plan: 1. Pulmonary hypertension: Patient has pulmonary arterial hypertension.  She appears to have co-existing diastolic LV dysfunction given elevated PCWP on prior RHC in 1/17.  PFTs did not show significant obstruction, they only showed moderately decreased DLCO consistent with pulmonary vascular disease. V/Q scan did not show evidence for chronic PE.  PVR 7.6 WU by Fick and 8.4 WU by thermodiluation on 1/17 RHC.  Cardiac index was low, 1.97 Fick/1.8 thermo. 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  Most recent echo in 12/18 showed normal RV size and systolic function with PASP 52 mmHg. Given worsening symptoms, RHC was repeated in 11/18.  This showed PVR 3.7 WU with mean PA pressure 32 and normal CI.  She is overall doing better this appt, think deconditioning and anxiety played a significant role in her dyspnea as objectively she appears to be improving.  - She tolerated increase in riociguat to 2.5 mg tid.  - She has sleep apnea, now using CPAP.  - Continue Opsumit and Selexipag, unable to increase Selexipag beyond 600 mcg bid due to side effects.  - We talked about increasing her exercise as I suspect that her dyspnea is due in part to deconditioning.  - 6 minute walk at followup appt.  2. Chronic diastolic CHF: Elevated PCWP on 1/17 RHC.  Last echo in 12/18 with EF 60-65%. She is doing better symptomatically this appointment but still NYHA class III. She is not volume overloaded.   - Continue Lasix 80 qam/40 qpm.  BMET today.  3. Atrial fibrillation: Paroxysmal.  She is in NSR today.  She had atrial fibrillation ablation in 6/18. Based on holter in 12/18, her palpitations are likely due to PACs/PVCs. She has tolerated Toprol XL 12.5 mg daily but it has not resolved her palpitations.  - Increase Toprol XL to 25 mg daily to see if this helps with palpitations.  - Continue apixaban - Tikosyn would be an option for arrhythmia control if needed in the future, but would be more difficult due to impaired renal function.  4. CKD: Stage III.  5. Thyroid nodule: See on CT chest, needs thyroid US.    Followup in 3 months.   Loralie Champagne 07/22/2017

## 2017-07-24 ENCOUNTER — Encounter: Payer: Self-pay | Admitting: Cardiovascular Disease

## 2017-07-28 DIAGNOSIS — K5790 Diverticulosis of intestine, part unspecified, without perforation or abscess without bleeding: Secondary | ICD-10-CM | POA: Insufficient documentation

## 2017-07-28 DIAGNOSIS — H269 Unspecified cataract: Secondary | ICD-10-CM | POA: Insufficient documentation

## 2017-07-28 DIAGNOSIS — I519 Heart disease, unspecified: Secondary | ICD-10-CM | POA: Insufficient documentation

## 2017-07-28 DIAGNOSIS — K222 Esophageal obstruction: Secondary | ICD-10-CM | POA: Insufficient documentation

## 2017-07-28 DIAGNOSIS — I7300 Raynaud's syndrome without gangrene: Secondary | ICD-10-CM | POA: Insufficient documentation

## 2017-07-30 ENCOUNTER — Encounter (HOSPITAL_COMMUNITY): Payer: Medicare Other | Admitting: Cardiology

## 2017-08-05 ENCOUNTER — Encounter: Payer: Self-pay | Admitting: Family Medicine

## 2017-08-05 ENCOUNTER — Ambulatory Visit: Payer: Medicare Other | Admitting: Family Medicine

## 2017-08-05 VITALS — BP 130/68 | HR 86 | Temp 98.3°F | Ht 60.0 in | Wt 150.0 lb

## 2017-08-05 DIAGNOSIS — M65311 Trigger thumb, right thumb: Secondary | ICD-10-CM

## 2017-08-05 NOTE — Progress Notes (Signed)
Tiffany [name] - 72 y.o. female MRN [id number]  Date of birth: [birth year]  SUBJECTIVE:  Including CC & ROS.  Chief Complaint  Patient presents with  . Trigger finger right thumb    Tiffany [name] is a 72 y.o. female that is following up with trigger finger right thumb. She did receive an injection on 03/09/17, she states the pain has been increasing over the past month. States it has been aching more constant. The pain is on the volar surface of her right thumb. No injury. The previous injection helped. Would like to put off surgery. Pain is localized. Pain is worse in the morning and when she has to use her thumb. She is right handed. Has not tried any medications. 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    Review of Systems  Constitutional: Negative for fever.  Respiratory: Negative for shortness of breath.   Cardiovascular: Negative for chest pain.  Gastrointestinal: Negative for abdominal pain.  Musculoskeletal: Negative for arthralgias and gait problem.  Skin: Negative for color change.  Neurological: Negative for weakness.  Hematological: Negative for adenopathy.  Psychiatric/Behavioral: Negative for agitation.    HISTORY: Past Medical, Surgical, Social, and Family History Reviewed & Updated per EMR.   Pertinent Historical Findings include:  Past Medical History:  Diagnosis Date  . Arthritis 04-20-12   Osteoarthritis-right hip  . Atrial fibrillation status post cardioversion, 01/30/15 maintaining SR.  01/31/2015   a. 01/2015 s/p TEE/DCCV;  b. 02/06/2015 recurrent AF noted, amio added;  c. CHA2DS2VASc = 3-->eliquis.  Marland Kitchen BENIGN POSITIONAL VERTIGO   . Cataract   . Complication of anesthesia 04-20-12   some issues with prolonged sedation after anesthesia  . Diverticulosis   . DYSLIPIDEMIA   . Esophageal stricture   . GERD   . HYPERTENSION    a. severe, pt intol of med tx, Pt. has severe "whitecoat" syndrome and refused medical therapy.  .  Hypothyroidism 09/29/2014   a. pt did not tolerate synthroid and this was subsequently discontinued.  . Mild LV dysfunction    a. 01/2015 Echo: EF 45-50%, mild MR, mildly dil LA, mod dil RV with mod to sev reduced fxn, mod-sev TR, PASP 72mmHg.  . Osteopenia   . Raynaud disease   . URINARY INCONTINENCE 04-20-12   occ. with nighttime sleep pattern    Past Surgical History:  Procedure Laterality Date  . ATRIAL FIBRILLATION ABLATION N/A 01/26/2017   Procedure: Atrial Fibrillation Ablation;  Surgeon: Thompson Grayer, MD;  Location: [facility];  Service: Cardiovascular;  Laterality: N/A;  . breast ultrasound Right 09/13/13   There is no sonographic evidence of malignancy. the 5.6EP complicated cyst in (R) breast is consistent with a benign finding. repeat in 1 year  . CARDIAC CATHETERIZATION N/A 08/29/2015   Procedure: Right Heart Cath;  Surgeon: Larey Dresser, MD;  Location: [facility];  Service: Cardiovascular;  Laterality: N/A;  . CARDIOVERSION N/A 01/30/2015   Procedure: CARDIOVERSION;  Surgeon: Sanda Klein, MD;  Location: [facility];  Service: Cardiovascular;  Laterality: N/A;  . CHOLECYSTECTOMY     '90-"sludge"  . NASAL FRACTURE SURGERY  2006  . RIGHT HEART CATH N/A 06/30/2017   Procedure: RIGHT HEART CATH;  Surgeon: Larey Dresser, MD;  Location: [facility];  Service: Cardiovascular;  Laterality: N/A;  . TEE WITHOUT CARDIOVERSION N/A 01/30/2015   Procedure: TRANSESOPHAGEAL ECHOCARDIOGRAM (TEE);  Surgeon: Sanda Klein, MD;  Location: [facility];  Service:  Cardiovascular;  Laterality: N/A;  . TEE WITHOUT CARDIOVERSION N/A 01/25/2017   Procedure: TRANSESOPHAGEAL ECHOCARDIOGRAM (TEE);  Surgeon: Fay Records, MD;  Location[facility];  Service: Cardiovascular;  Laterality: N/A;  . TOTAL HIP ARTHROPLASTY  04/26/2012   Procedure: TOTAL HIP ARTHROPLASTY ANTERIOR APPROACH;  Surgeon: Mauri Pole, MD;  Location: [facility];  Service: Orthopedics;  Laterality: Right;    . TUBAL LIGATION  1980    Allergies  Allergen Reactions  . Levothyroxine Shortness Of Breath and Other (See Comments)    Exhaustion also - Per pt her this medication could have not been the reason for her symptoms  . Statins Other (See Comments)    "Pain, weakness and kidney problems"  . Prednisone Other (See Comments)    Severe insomnia  . Penicillins Other (See Comments)    dry mouth Has patient had a PCN reaction causing immediate rash, facial/tongue/throat swelling, SOB or lightheadedness with hypotension: No Has patient had a PCN reaction causing severe rash involving mucus membranes or skin necrosis: No Has patient had a PCN reaction that required hospitalization No Has patient had a PCN reaction occurring within the last 10 years: No If all of the above answers are "NO", then may proceed with Cephalosporin use.     Family History  Adopted: Yes  Problem Relation Age of Onset  . Other Unknown        patient was adopted     Social History   Socioeconomic History  . Marital status: Married    Spouse name: Not on file  . Number of children: 2  . Years of education: Not on file  . Highest education level: Not on file  Social Needs  . Financial resource strain: Not on file  . Food insecurity - worry: Not on file  . Food insecurity - inability: Not on file  . Transportation needs - medical: Not on file  . Transportation needs - non-medical: Not on file  Occupational History  . Not on file  Tobacco Use  . Smoking status: Never Smoker  . Smokeless tobacco: Never Used  Substance and Sexual Activity  . Alcohol use: Yes    Alcohol/week: 1.2 oz    Types: 2 Glasses of wine per week    Comment: several glasses wine weekly  . Drug use: No  . Sexual activity: Yes  Other Topics Concern  . Not on file  Social History Narrative   She enjoys birding.  Lives at home with husband.   Married.   retired [employer]ar[employer] manager, Tax inspector     PHYSICAL EXAM:   VS: BP 130/68 (BP Location: Left Arm, Patient Position: Sitting, Cuff Size: Normal)   Pulse 86   Temp 98.3 F (36.8 C) (Oral)   Ht 5' (1.524 m)   Wt 150 lb (68 kg)   SpO2 98%   BMI 29.29 kg/m  Physical Exam Gen: NAD, alert, cooperative with exam, well-appearing ENT: normal lips, normal nasal mucosa,  Eye: normal EOM, normal conjunctiva and lids CV:  no edema, +2 pedal pulses   Resp: no accessory muscle use, non-labored,  GI: no masses or tenderness, no hernia  Skin: no rashes, no areas of induration  Neuro: normal tone, normal sensation to touch Psych:  normal insight, alert and oriented MSK:  Right thumb:  Obvious triggering.  No atrophy TTP of the flexor surface.  Normal extension  Normal opposition  No swelling or ecchymosis  Neurovascularly intact.    Aspiration/Injection Procedure Note  CHARIZMA GARDINER [birth year]  Procedure: Injection Indications: right thumb pain  Procedure Details Consent: Risks of procedure as well as the alternatives and risks of each were explained to the (patient/caregiver).  Consent for procedure obtained. Time Out: Verified patient identification, verified procedure, site/side was marked, verified correct patient position, special equipment/implants available, medications/allergies/relevent history reviewed, required imaging and test results available.  Performed.  The area was cleaned with iodine and alcohol swabs.    The right trigger thumb was injected using 0.5 cc's of 40 mg kenalog and 0.5 cc's of 1% lidocaine with a 25 1 1/2" needle.  Ultrasound was used. Images were obtained in  Long views showing the injection.    A sterile dressing was applied.  Patient did tolerate procedure well.         ASSESSMENT & PLAN:   Trigger finger of right thumb An acute flare of her triggering.  - injection today  - splint applied.  - if fails could try another injection vs surgery.

## 2017-08-06 NOTE — Assessment & Plan Note (Signed)
An acute flare of her triggering.  - injection today  - splint applied.  - if fails could try another injection vs surgery.

## 2017-08-11 ENCOUNTER — Telehealth (HOSPITAL_COMMUNITY): Payer: Self-pay | Admitting: Pharmacist

## 2017-08-11 NOTE — Telephone Encounter (Signed)
Uptravi 600 mcg BID PA approved by [employer] through 08/02/18.   Ruta Hinds. Velva Harman, PharmD, BCPS, CPP Clinical Pharmacist Phone: [phone] 08/11/2017 11:43 AM

## 2017-08-12 ENCOUNTER — Other Ambulatory Visit (HOSPITAL_COMMUNITY): Payer: Self-pay | Admitting: *Deleted

## 2017-08-12 MED ORDER — SERTRALINE HCL 50 MG PO TABS: 50.0000 mg | ORAL_TABLET | Freq: Every day | ORAL | 3 refills | Status: DC

## 2017-08-13 ENCOUNTER — Ambulatory Visit: Payer: Medicare Other | Admitting: Internal Medicine

## 2017-08-13 VITALS — BP 99/61 | HR 65 | Ht 60.0 in | Wt 150.8 lb

## 2017-08-13 DIAGNOSIS — G4733 Obstructive sleep apnea (adult) (pediatric): Secondary | ICD-10-CM

## 2017-08-13 DIAGNOSIS — I272 Pulmonary hypertension, unspecified: Secondary | ICD-10-CM

## 2017-08-13 DIAGNOSIS — I480 Paroxysmal atrial fibrillation: Secondary | ICD-10-CM | POA: Diagnosis not present

## 2017-08-13 NOTE — Progress Notes (Signed)
PCP: [name], MD Primary Cardiologist: Dr [name] Primary EP: Dr Lemmie Evens is a 72 y.o. female who presents today for routine electrophysiology followup.  Since last being seen in our clinic, the patient reports doing reasonably well.  She continues to have difficulty with SOB for which she is followed by Dr [name].  Her afib is much better post ablation.  She has rare palpitations with heavy lifting or if she "overdoes it".  Today, she denies symptoms of palpitations, chest pain,  lower extremity edema, dizziness, presyncope, or syncope.  The patient is otherwise without complaint today.   Past Medical History:  Diagnosis Date  . Arthritis 04-20-12   Osteoarthritis-right hip  . Atrial fibrillation status post cardioversion, 01/30/15 maintaining SR.  01/31/2015   a. 01/2015 s/p TEE/DCCV;  b. 02/06/2015 recurrent AF noted, amio added;  c. CHA2DS2VASc = 3-->eliquis.  Marland Kitchen BENIGN POSITIONAL VERTIGO   . Cataract   . Complication of anesthesia 04-20-12   some issues with prolonged sedation after anesthesia  . Diverticulosis   . DYSLIPIDEMIA   . Esophageal stricture   . GERD   . HYPERTENSION    a. severe, pt intol of med tx, Pt. has severe "whitecoat" syndrome and refused medical therapy.  . Hypothyroidism 09/29/2014   a. pt did not tolerate synthroid and this was subsequently discontinued.  . Mild LV dysfunction    a. 01/2015 Echo: EF 45-50%, mild MR, mildly dil LA, mod dil RV with mod to sev reduced fxn, mod-sev TR, PASP 63mmHg.  . Osteopenia   . Raynaud disease   . URINARY INCONTINENCE 04-20-12   occ. with nighttime sleep pattern   Past Surgical History:  Procedure Laterality Date  . ATRIAL FIBRILLATION ABLATION N/A 01/26/2017   Procedure: Atrial Fibrillation Ablation;  Surgeon: Thompson Grayer, MD;  Location: [facility];  Service: Cardiovascular;  Laterality: N/A;  . breast ultrasound Right 09/13/13   There is no sonographic evidence of malignancy. the 7.9XT  complicated cyst in (R) breast is consistent with a benign finding. repeat in 1 year  . CARDIAC CATHETERIZATION N/A 08/29/2015   Procedure: Right Heart Cath;  Surgeon: Larey Dresser, MD;  Location: [facility];  Service: Cardiovascular;  Laterality: N/A;  . CARDIOVERSION N/A 01/30/2015   Procedure: CARDIOVERSION;  Surgeon: Sanda Klein, MD;  Location: [facility];  Service: Cardiovascular;  Laterality: N/A;  . CHOLECYSTECTOMY     '90-"sludge"  . NASAL FRACTURE SURGERY  2006  . RIGHT HEART CATH N/A 06/30/2017   Procedure: RIGHT HEART CATH;  Surgeon: Larey Dresser, MD;  Location: [place];  Service: Cardiovascular;  Laterality: N/A;  . TEE WITHOUT CARDIOVERSION N/A 01/30/2015   Procedure: TRANSESOPHAGEAL ECHOCARDIOGRAM (TEE);  Surgeon: Sanda Klein, MD;  Location: [facility];  Service: Cardiovascular;  Laterality: N/A;  . TEE WITHOUT CARDIOVERSION N/A 01/25/2017   Procedure: TRANSESOPHAGEAL ECHOCARDIOGRAM (TEE);  Surgeon: Fay Records, MD;  Location: [facility];  Service: Cardiovascular;  Laterality: N/A;  . TOTAL HIP ARTHROPLASTY  04/26/2012   Procedure: TOTAL HIP ARTHROPLASTY ANTERIOR APPROACH;  Surgeon: Mauri Pole, MD;  Location: [facility];  Service: Orthopedics;  Laterality: Right;  . TUBAL LIGATION  1980    ROS- all systems are reviewed and negatives except as per HPI above  Current Outpatient Medications  Medication Sig Dispense Refill  . acetaminophen (TYLENOL) 325 MG tablet Take 2 tablets (650 mg total) by mouth every 4 (four) hours as needed for headache or  mild pain.    Marland Kitchen apixaban (ELIQUIS) 5 MG TABS tablet Take 1 tablet (5 mg total) by mouth 2 (two) times daily. 180 tablet 1  . conjugated estrogens (PREMARIN) vaginal cream Place 1 Applicatorful every Saturday vaginally. Using 1.5 grams weekly    . famotidine (PEPCID) 20 MG tablet Take 20 mg by mouth daily at 12 noon.     . fluticasone (FLONASE) 50 MCG/ACT nasal spray Place 1 spray daily as needed into  both nostrils for allergies or rhinitis.    . furosemide (LASIX) 40 MG tablet Take 2 tablets by mouth in the morning and 1 tablet in the afternoon    . Macitentan (OPSUMIT) 10 MG TABS Take 1 tablet (10 mg total) by mouth daily. 30 tablet 11  . metoprolol succinate (TOPROL-XL) 25 MG 24 hr tablet Take 1 tablet (25 mg total) by mouth daily. Take with or immediately following a meal. 30 tablet 3  . potassium chloride SA (KLOR-CON M20) 20 MEQ tablet Take 2 tablets (40 mEq total) by mouth daily. 60 tablet 3  . Probiotic Product (PROBIOTIC-10) CAPS Take 1 capsule by mouth daily.     . Riociguat (ADEMPAS) 2.5 MG TABS Take 2.5 mg by mouth 3 (three) times daily. 90 tablet 3  . Selexipag (UPTRAVI) 600 MCG TABS Take 600 mcg by mouth 2 (two) times daily. 60 tablet 11  . sertraline (ZOLOFT) 50 MG tablet Take 1 tablet (50 mg total) by mouth daily. 90 tablet 3   No current facility-administered medications for this visit.     Physical Exam: Vitals:   08/13/17 1115  BP: 99/61  Pulse: 65  SpO2: 99%  Weight: 150 lb 12.8 oz (68.4 kg)  Height: 5' (1.524 m)    GEN- The patient is well appearing, alert and oriented x 3 today.   Head- normocephalic, atraumatic Eyes-  Sclera clear, conjunctiva pink Ears- hearing intact Oropharynx- clear Lungs- Clear to ausculation bilaterally, normal work of breathing Heart- Regular rate and rhythm, no murmurs, rubs or gallops, PMI not laterally displaced GI- soft, NT, ND, + BS Extremities- no clubbing, cyanosis, or edema  EKG tracing ordered today is personally reviewed and shows sinus rhythm 65 bpm, PR 142 msec, poor R wave progression  Assessment and Plan:  1. Paroxysmal atrial fibrillation Doing well s/p ablation Continue eliquis for chads2vasc score of 3 No changes at this time  2. [place] Per Dr [name]  3. OSA Compliant with CPAP  4. HTN Stable No change required today  I will see in 6 months  Thompson Grayer MD, [facility] 08/13/2017 11:39 AM

## 2017-08-13 NOTE — Patient Instructions (Signed)

## 2017-08-16 ENCOUNTER — Other Ambulatory Visit (HOSPITAL_COMMUNITY): Payer: Self-pay | Admitting: *Deleted

## 2017-08-16 MED ORDER — MACITENTAN 10 MG PO TABS: 10.0000 mg | ORAL_TABLET | Freq: Every day | ORAL | 11 refills | Status: DC

## 2017-09-01 ENCOUNTER — Other Ambulatory Visit (HOSPITAL_COMMUNITY): Payer: Self-pay | Admitting: Cardiology

## 2017-09-28 ENCOUNTER — Telehealth: Payer: Self-pay | Admitting: *Deleted

## 2017-09-28 NOTE — Telephone Encounter (Signed)
Faxed CPAP supply order to  [facility].

## 2017-10-01 ENCOUNTER — Other Ambulatory Visit (HOSPITAL_COMMUNITY): Payer: Self-pay | Admitting: *Deleted

## 2017-10-01 MED ORDER — POTASSIUM CHLORIDE CRYS ER 20 MEQ PO TBCR: 40.0000 meq | EXTENDED_RELEASE_TABLET | Freq: Every day | ORAL | 3 refills | Status: DC

## 2017-10-06 ENCOUNTER — Telehealth: Payer: Self-pay | Admitting: Internal Medicine

## 2017-10-06 NOTE — Telephone Encounter (Signed)
Spoke with Tiffany [name] regarding AWV. Pt declined to schedule an appointment at this time. SF

## 2017-10-08 ENCOUNTER — Ambulatory Visit (INDEPENDENT_AMBULATORY_CARE_PROVIDER_SITE_OTHER): Payer: Medicare Other | Admitting: Internal Medicine

## 2017-10-08 ENCOUNTER — Other Ambulatory Visit (HOSPITAL_COMMUNITY): Payer: Self-pay | Admitting: *Deleted

## 2017-10-08 ENCOUNTER — Encounter: Payer: Self-pay | Admitting: Internal Medicine

## 2017-10-08 ENCOUNTER — Other Ambulatory Visit (INDEPENDENT_AMBULATORY_CARE_PROVIDER_SITE_OTHER): Payer: Medicare Other

## 2017-10-08 VITALS — BP 90/62 | HR 70 | Temp 98.2°F | Ht 60.0 in | Wt 150.0 lb

## 2017-10-08 DIAGNOSIS — E785 Hyperlipidemia, unspecified: Secondary | ICD-10-CM | POA: Diagnosis not present

## 2017-10-08 DIAGNOSIS — N184 Chronic kidney disease, stage 4 (severe): Secondary | ICD-10-CM

## 2017-10-08 DIAGNOSIS — Z0000 Encounter for general adult medical examination without abnormal findings: Secondary | ICD-10-CM | POA: Diagnosis not present

## 2017-10-08 DIAGNOSIS — Z23 Encounter for immunization: Secondary | ICD-10-CM | POA: Diagnosis not present

## 2017-10-08 DIAGNOSIS — M341 CR(E)ST syndrome: Secondary | ICD-10-CM

## 2017-10-08 DIAGNOSIS — F419 Anxiety disorder, unspecified: Secondary | ICD-10-CM

## 2017-10-08 DIAGNOSIS — E038 Other specified hypothyroidism: Secondary | ICD-10-CM

## 2017-10-08 LAB — LIPID PANEL
Cholesterol: 212 mg/dL — ABNORMAL HIGH (ref 0–200)
HDL: 55.9 mg/dL (ref >39.00)
LDL Cholesterol: 123 mg/dL — ABNORMAL HIGH (ref 0–99)
NonHDL: 155.82
Total CHOL/HDL Ratio: 4
Triglycerides: 166 mg/dL — ABNORMAL HIGH (ref 0.0–149.0)
VLDL: 33.2 mg/dL (ref 0.0–40.0)

## 2017-10-08 LAB — COMPREHENSIVE METABOLIC PANEL
ALT: 11 U/L (ref 0–35)
AST: 14 U/L (ref 0–37)
Albumin: 4 g/dL (ref 3.5–5.2)
Alkaline Phosphatase: 59 U/L (ref 39–117)
BUN: 27 mg/dL — ABNORMAL HIGH (ref 6–23)
CO2: 29 mEq/L (ref 19–32)
Calcium: 9.5 mg/dL (ref 8.4–10.5)
Chloride: 102 mEq/L (ref 96–112)
Creatinine, Ser: 1.74 mg/dL — ABNORMAL HIGH (ref 0.40–1.20)
GFR: 30.45 mL/min — ABNORMAL LOW (ref >60.00)
Glucose, Bld: 95 mg/dL (ref 70–99)
Potassium: 3.8 mEq/L (ref 3.5–5.1)
Sodium: 140 mEq/L (ref 135–145)
Total Bilirubin: 0.6 mg/dL (ref 0.2–1.2)
Total Protein: 7.2 g/dL (ref 6.0–8.3)

## 2017-10-08 LAB — TSH: TSH: 2.66 u[IU]/mL (ref 0.35–4.50)

## 2017-10-08 LAB — CBC
HCT: 38.3 % (ref 36.0–46.0)
Hemoglobin: 13.1 g/dL (ref 12.0–15.0)
MCHC: 34.2 g/dL (ref 30.0–36.0)
MCV: 87.4 fl (ref 78.0–100.0)
Platelets: 185 10*3/uL (ref 150.0–400.0)
RBC: 4.38 Mil/uL (ref 3.87–5.11)
RDW: 16.7 % — ABNORMAL HIGH (ref 11.5–15.5)
WBC: 4.8 10*3/uL (ref 4.0–10.5)

## 2017-10-08 LAB — HEMOGLOBIN A1C: Hgb A1c MFr Bld: 5.2 % (ref 4.6–6.5)

## 2017-10-08 MED ORDER — RIOCIGUAT 2.5 MG PO TABS: 2.5000 mg | ORAL_TABLET | Freq: Three times a day (TID) | ORAL | 3 refills | Status: DC

## 2017-10-08 MED ORDER — ZOSTER VAC RECOMB ADJUVANTED 50 MCG/0.5ML IM SUSR: 0.5000 mL | Freq: Once | INTRAMUSCULAR | 1 refills | Status: AC

## 2017-10-08 NOTE — Patient Instructions (Addendum)
The omeprazole will decrease the dose of the adempas about 20%.  You can increase the dose of pepcid to up to 60 mg daily, so try 10 mg at night time to see if this helps more.  Health Maintenance, Female Adopting a healthy lifestyle and getting preventive care can go a long way to promote health and wellness. Talk with your health care provider about what schedule of regular examinations is right for you. This is a good chance for you to check in with your provider about disease prevention and staying healthy. In between checkups, there are plenty of things you can do on your own. Experts have done a lot of research about which lifestyle changes and preventive measures are most likely to keep you healthy. Ask your health care provider for more information. Weight and diet Eat a healthy diet  Be sure to include plenty of vegetables, fruits, low-fat dairy products, and lean protein.  Do not eat a lot of foods high in solid fats, added sugars, or salt.  Get regular exercise. This is one of the most important things you can do for your health. ? Most adults should exercise for at least 150 minutes each week. The exercise should increase your heart rate and make you sweat (moderate-intensity exercise). ? Most adults should also do strengthening exercises at least twice a week. This is in addition to the moderate-intensity exercise.  Maintain a healthy weight  Body mass index (BMI) is a measurement that can be used to identify possible weight problems. It estimates body fat based on height and weight. Your health care provider can help determine your BMI and help you achieve or maintain a healthy weight.  For females 93 years of age and older: ? A BMI below 18.5 is considered underweight. ? A BMI of 18.5 to 24.9 is normal. ? A BMI of 25 to 29.9 is considered overweight. ? A BMI of 30 and above is considered obese.  Watch levels of cholesterol and blood lipids  You should start having your  blood tested for lipids and cholesterol at 74 years of age, then have this test every 5 years.  You may need to have your cholesterol levels checked more often if: ? Your lipid or cholesterol levels are high. ? You are older than 74 years of age. ? You are at high risk for heart disease.  Cancer screening Lung Cancer  Lung cancer screening is recommended for adults 79-23 years old who are at high risk for lung cancer because of a history of smoking.  A yearly low-dose CT scan of the lungs is recommended for people who: ? Currently smoke. ? Have quit within the past 15 years. ? Have at least a 30-pack-year history of smoking. A pack year is smoking an average of one pack of cigarettes a day for 1 year.  Yearly screening should continue until it has been 15 years since you quit.  Yearly screening should stop if you develop a health problem that would prevent you from having lung cancer treatment.  Breast Cancer  Practice breast self-awareness. This means understanding how your breasts normally appear and feel.  It also means doing regular breast self-exams. Let your health care provider know about any changes, no matter how small.  If you are in your 20s or 30s, you should have a clinical breast exam (CBE) by a health care provider every 1-3 years as part of a regular health exam.  If you are 40 or older, have  a CBE every year. Also consider having a breast X-ray (mammogram) every year.  If you have a family history of breast cancer, talk to your health care provider about genetic screening.  If you are at high risk for breast cancer, talk to your health care provider about having an MRI and a mammogram every year.  Breast cancer gene (BRCA) assessment is recommended for women who have family members with BRCA-related cancers. BRCA-related cancers include: ? Breast. ? Ovarian. ? Tubal. ? Peritoneal cancers.  Results of the assessment will determine the need for genetic  counseling and BRCA1 and BRCA2 testing.  Cervical Cancer Your health care provider may recommend that you be screened regularly for cancer of the pelvic organs (ovaries, uterus, and vagina). This screening involves a pelvic examination, including checking for microscopic changes to the surface of your cervix (Pap test). You may be encouraged to have this screening done every 3 years, beginning at age 60.  For women ages 51-65, health care providers may recommend pelvic exams and Pap testing every 3 years, or they may recommend the Pap and pelvic exam, combined with testing for human papilloma virus (HPV), every 5 years. Some types of HPV increase your risk of cervical cancer. Testing for HPV may also be done on women of any age with unclear Pap test results.  Other health care providers may not recommend any screening for nonpregnant women who are considered low risk for pelvic cancer and who do not have symptoms. Ask your health care provider if a screening pelvic exam is right for you.  If you have had past treatment for cervical cancer or a condition that could lead to cancer, you need Pap tests and screening for cancer for at least 20 years after your treatment. If Pap tests have been discontinued, your risk factors (such as having a new sexual partner) need to be reassessed to determine if screening should resume. Some women have medical problems that increase the chance of getting cervical cancer. In these cases, your health care provider may recommend more frequent screening and Pap tests.  Colorectal Cancer  This type of cancer can be detected and often prevented.  Routine colorectal cancer screening usually begins at 74 years of age and continues through 74 years of age.  Your health care provider may recommend screening at an earlier age if you have risk factors for colon cancer.  Your health care provider may also recommend using home test kits to check for hidden blood in the  stool.  A small camera at the end of a tube can be used to examine your colon directly (sigmoidoscopy or colonoscopy). This is done to check for the earliest forms of colorectal cancer.  Routine screening usually begins at age 25.  Direct examination of the colon should be repeated every 5-10 years through 74 years of age. However, you may need to be screened more often if early forms of precancerous polyps or small growths are found.  Skin Cancer  Check your skin from head to toe regularly.  Tell your health care provider about any new moles or changes in moles, especially if there is a change in a mole's shape or color.  Also tell your health care provider if you have a mole that is larger than the size of a pencil eraser.  Always use sunscreen. Apply sunscreen liberally and repeatedly throughout the day.  Protect yourself by wearing long sleeves, pants, a wide-brimmed hat, and sunglasses whenever you are outside.  Heart  disease, diabetes, and high blood pressure  High blood pressure causes heart disease and increases the risk of stroke. High blood pressure is more likely to develop in: ? People who have blood pressure in the high end of the normal range (130-139/85-89 mm Hg). ? People who are overweight or obese. ? People who are African American.  If you are 86-35 years of age, have your blood pressure checked every 3-5 years. If you are 73 years of age or older, have your blood pressure checked every year. You should have your blood pressure measured twice-once when you are at a hospital or clinic, and once when you are not at a hospital or clinic. Record the average of the two measurements. To check your blood pressure when you are not at a hospital or clinic, you can use: ? An automated blood pressure machine at a pharmacy. ? A home blood pressure monitor.  If you are between 28 years and 37 years old, ask your health care provider if you should take aspirin to prevent  strokes.  Have regular diabetes screenings. This involves taking a blood sample to check your fasting blood sugar level. ? If you are at a normal weight and have a low risk for diabetes, have this test once every three years after 74 years of age. ? If you are overweight and have a high risk for diabetes, consider being tested at a younger age or more often. Preventing infection Hepatitis B  If you have a higher risk for hepatitis B, you should be screened for this virus. You are considered at high risk for hepatitis B if: ? You were born in a country where hepatitis B is common. Ask your health care provider which countries are considered high risk. ? Your parents were born in a high-risk country, and you have not been immunized against hepatitis B (hepatitis B vaccine). ? You have HIV or AIDS. ? You use needles to inject street drugs. ? You live with someone who has hepatitis B. ? You have had sex with someone who has hepatitis B. ? You get hemodialysis treatment. ? You take certain medicines for conditions, including cancer, organ transplantation, and autoimmune conditions.  Hepatitis C  Blood testing is recommended for: ? Everyone born from [birth year] through 1967. ? Anyone with known risk factors for hepatitis C.  Sexually transmitted infections (STIs)  You should be screened for sexually transmitted infections (STIs) including gonorrhea and chlamydia if: ? You are sexually active and are younger than 74 years of age. ? You are older than 74 years of age and your health care provider tells you that you are at risk for this type of infection. ? Your sexual activity has changed since you were last screened and you are at an increased risk for chlamydia or gonorrhea. Ask your health care provider if you are at risk.  If you do not have HIV, but are at risk, it may be recommended that you take a prescription medicine daily to prevent HIV infection. This is called pre-exposure prophylaxis  (PrEP). You are considered at risk if: ? You are sexually active and do not regularly use condoms or know the HIV status of your partner(s). ? You take drugs by injection. ? You are sexually active with a partner who has HIV.  Talk with your health care provider about whether you are at high risk of being infected with HIV. If you choose to begin PrEP, you should first be tested for HIV. You  should then be tested every 3 months for as long as you are taking PrEP. Pregnancy  If you are premenopausal and you may become pregnant, ask your health care provider about preconception counseling.  If you may become pregnant, take 400 to 800 micrograms (mcg) of folic acid every day.  If you want to prevent pregnancy, talk to your health care provider about birth control (contraception). Osteoporosis and menopause  Osteoporosis is a disease in which the bones lose minerals and strength with aging. This can result in serious bone fractures. Your risk for osteoporosis can be identified using a bone density scan.  If you are 69 years of age or older, or if you are at risk for osteoporosis and fractures, ask your health care provider if you should be screened.  Ask your health care provider whether you should take a calcium or vitamin D supplement to lower your risk for osteoporosis.  Menopause may have certain physical symptoms and risks.  Hormone replacement therapy may reduce some of these symptoms and risks. Talk to your health care provider about whether hormone replacement therapy is right for you. Follow these instructions at home:  Schedule regular health, dental, and eye exams.  Stay current with your immunizations.  Do not use any tobacco products including cigarettes, chewing tobacco, or electronic cigarettes.  If you are pregnant, do not drink alcohol.  If you are breastfeeding, limit how much and how often you drink alcohol.  Limit alcohol intake to no more than 1 drink per day for  nonpregnant women. One drink equals 12 ounces of beer, 5 ounces of wine, or 1 ounces of hard liquor.  Do not use street drugs.  Do not share needles.  Ask your health care provider for help if you need support or information about quitting drugs.  Tell your health care provider if you often feel depressed.  Tell your health care provider if you have ever been abused or do not feel safe at home. This information is not intended to replace advice given to you by your health care provider. Make sure you discuss any questions you have with your health care provider. Document Released: 02/02/2011 Document Revised: 12/26/2015 Document Reviewed: 04/23/2015 Elsevier Interactive Patient Education  [employer].

## 2017-10-08 NOTE — Assessment & Plan Note (Signed)
Flu shot up to date, tetanus up to date. Pneumonia up to date. Shingrix given rx. Colonoscopy up to date. Mammogram up to date  and dexa up to date. Counseled about sun safety and mole surveillance. Counseled about the dangers of distracted driving. Given 10 year screening recommendations.

## 2017-10-08 NOTE — Assessment & Plan Note (Signed)
Okay with refill diazepam and stop zoloft and we talked about 2 weeks of 1/2 pill daily then stop. She does not feel she has depression and does not think this has helped at all. She wants to talk to Dr. [name] about this as he started it.

## 2017-10-08 NOTE — Progress Notes (Addendum)
   Subjective:    Patient ID: Tiffany [name], female    DOB: 29-Feb-1946, 72 y.o.   MRN: [id number]  HPI Here for medicare wellness and physical, no new complaints. Please see A/P for status and treatment of chronic medical problems.   Diet: heart healthy Physical activity: sedentary, working on walking in the house daily Depression/mood screen: negative Hearing: intact to whispered voice with bilateral aids Visual acuity: grossly normal with lens, performs annual eye exam  ADLs: capable Fall risk: none Home safety: good Cognitive evaluation: intact to orientation, naming, recall and repetition EOL planning: adv directives discussed  I have personally reviewed and have noted 1. The patient's medical and social history - reviewed today no changes 2. Their use of alcohol, tobacco or illicit drugs 3. Their current medications and supplements 4. The patient's functional ability including ADL's, fall risks, home safety risks and hearing or visual impairment. 5. Diet and physical activities 6. Evidence for depression or mood disorders 7. Care team reviewed and updated (available in snapshot)  Review of Systems  Constitutional: Positive for activity change and fatigue. Negative for appetite change, chills, fever and unexpected weight change.  HENT: Negative.   Eyes: Negative.   Respiratory: Positive for shortness of breath. Negative for cough and chest tightness.   Cardiovascular: Positive for palpitations. Negative for chest pain and leg swelling.  Gastrointestinal: Positive for diarrhea. Negative for abdominal distention, abdominal pain, constipation, nausea and vomiting.  Musculoskeletal: Positive for gait problem. Negative for arthralgias, myalgias and neck pain.  Skin: Negative.   Neurological: Negative for dizziness, tremors, seizures, weakness and light-headedness.  Psychiatric/Behavioral: Negative.       Objective:   Physical Exam  Constitutional: She is oriented to person,  place, and time. She appears well-developed and well-nourished.  HENT:  Head: Normocephalic and atraumatic.  Eyes: EOM are normal.  Neck: Normal range of motion.  Cardiovascular: Normal rate and regular rhythm.  Pulmonary/Chest: Effort normal and breath sounds normal. No respiratory distress. She has no wheezes. She has no rales.  Abdominal: Soft. Bowel sounds are normal. She exhibits no distension. There is no tenderness. There is no rebound.  Musculoskeletal: She exhibits no edema.  Neurological: She is alert and oriented to person, place, and time. Coordination normal.  Endurance and walking improved from prior  Skin: Skin is warm and dry.  Psychiatric: She has a normal mood and affect.   Vitals:   10/08/17 0954  BP: 90/62  Pulse: 70  Temp: 98.2 F (36.8 C)  TempSrc: Oral  Weight: 150 lb (68 kg)  Height: 5' (1.524 m)      Assessment & Plan:  Pneumonia 23 given at visit

## 2017-10-08 NOTE — Assessment & Plan Note (Signed)
Checking CMP today and she is on chronic lasix for swelling. Adjust as needed. BP at goal and no diabetes.

## 2017-10-08 NOTE — Assessment & Plan Note (Addendum)
Incomplete although she does have raynaud's, gerd, and PAH. She had seen rheum although her rheumatologist retired. Does not need another at this time.

## 2017-10-08 NOTE — Assessment & Plan Note (Signed)
Checking TSH and prior have been subclinical hypothyroidism.

## 2017-10-13 ENCOUNTER — Other Ambulatory Visit (HOSPITAL_COMMUNITY): Payer: Self-pay | Admitting: *Deleted

## 2017-10-13 MED ORDER — METOPROLOL SUCCINATE ER 25 MG PO TB24: 25.0000 mg | ORAL_TABLET | Freq: Every day | ORAL | 3 refills | Status: DC

## 2017-10-21 ENCOUNTER — Telehealth (HOSPITAL_COMMUNITY): Payer: Self-pay | Admitting: Vascular Surgery

## 2017-10-21 ENCOUNTER — Encounter (HOSPITAL_COMMUNITY): Payer: Self-pay | Admitting: Cardiology

## 2017-10-21 ENCOUNTER — Ambulatory Visit (HOSPITAL_COMMUNITY)
Admission: RE | Admit: 2017-10-21 | Discharge: 2017-10-21 | Disposition: A | Discharge status: Home / Self Care | Payer: Medicare Other | Source: Ambulatory Visit | Attending: Cardiology | Admitting: Cardiology

## 2017-10-21 VITALS — BP 107/62 | HR 73 | Wt 149.8 lb

## 2017-10-21 DIAGNOSIS — E041 Nontoxic single thyroid nodule: Secondary | ICD-10-CM | POA: Insufficient documentation

## 2017-10-21 DIAGNOSIS — E038 Other specified hypothyroidism: Secondary | ICD-10-CM

## 2017-10-21 DIAGNOSIS — I493 Ventricular premature depolarization: Secondary | ICD-10-CM | POA: Insufficient documentation

## 2017-10-21 DIAGNOSIS — I130 Hypertensive heart and chronic kidney disease with heart failure and stage 1 through stage 4 chronic kidney disease, or unspecified chronic kidney disease: Secondary | ICD-10-CM | POA: Insufficient documentation

## 2017-10-21 DIAGNOSIS — N183 Chronic kidney disease, stage 3 (moderate): Secondary | ICD-10-CM | POA: Insufficient documentation

## 2017-10-21 DIAGNOSIS — Z79899 Other long term (current) drug therapy: Secondary | ICD-10-CM | POA: Diagnosis not present

## 2017-10-21 DIAGNOSIS — Z7902 Long term (current) use of antithrombotics/antiplatelets: Secondary | ICD-10-CM | POA: Insufficient documentation

## 2017-10-21 DIAGNOSIS — R002 Palpitations: Secondary | ICD-10-CM | POA: Diagnosis not present

## 2017-10-21 DIAGNOSIS — I7300 Raynaud's syndrome without gangrene: Secondary | ICD-10-CM | POA: Insufficient documentation

## 2017-10-21 DIAGNOSIS — I2721 Secondary pulmonary arterial hypertension: Secondary | ICD-10-CM | POA: Diagnosis not present

## 2017-10-21 DIAGNOSIS — I480 Paroxysmal atrial fibrillation: Secondary | ICD-10-CM | POA: Diagnosis not present

## 2017-10-21 DIAGNOSIS — Z9889 Other specified postprocedural states: Secondary | ICD-10-CM | POA: Insufficient documentation

## 2017-10-21 DIAGNOSIS — I272 Pulmonary hypertension, unspecified: Secondary | ICD-10-CM

## 2017-10-21 DIAGNOSIS — I5032 Chronic diastolic (congestive) heart failure: Secondary | ICD-10-CM | POA: Diagnosis not present

## 2017-10-21 DIAGNOSIS — F419 Anxiety disorder, unspecified: Secondary | ICD-10-CM | POA: Insufficient documentation

## 2017-10-21 DIAGNOSIS — G4730 Sleep apnea, unspecified: Secondary | ICD-10-CM | POA: Insufficient documentation

## 2017-10-21 DIAGNOSIS — E039 Hypothyroidism, unspecified: Secondary | ICD-10-CM | POA: Insufficient documentation

## 2017-10-21 DIAGNOSIS — H8110 Benign paroxysmal vertigo, unspecified ear: Secondary | ICD-10-CM | POA: Diagnosis not present

## 2017-10-21 MED ORDER — SERTRALINE HCL 25 MG PO TABS: 25.0000 mg | ORAL_TABLET | Freq: Every day | ORAL | 3 refills | Status: DC

## 2017-10-21 NOTE — Progress Notes (Signed)
Pt Completed 6 minute walk test.Pt did have to rest for 1 minute Pt walked 850 ft (260 meters). O2 sats ranged from 78%-98%, and HR ranged from 85-100. Marland Kitchen

## 2017-10-21 NOTE — Progress Notes (Signed)
Patient ID: Tiffany [name], female   DOB: 1946/07/21, 72 y.o.   MRN: [id number] PCP: Dr. Sharlet Salina HF Cardiology: Dr. [name]  72 y.o. with history of paroxysmal atrial fibrillation and diastolic CHF was noted to have significant pulmonary hypertension and exertional dyspnea.  She has history of paroxysmal atrial fibrillation and had an episode in 6/16 requiring cardioversion.  She was started on amiodarone but this was stopped with worsening breathing.  Cardiolite in 9/16 showed on ischemia or infarction.     I had her do a RHC in 1/17.  This showed elevated left and right heart filling pressures but also PAH. After cath, she increased her Lasix from 40 qod to 40 daily.  At a prior appointment, I increased Lasix to 40 mg bid and also started her on Opsumit.  At next appointment, I added Adcirca 20 and then titrated it up to 40 mg daily. She continue to be volume overloaded, so  I increased Lasix to 80 qam/40 qpm. She has seemed to do well on this dose.   Echo was done in 6/17, RV mildly dilated with moderately decreased systolic function and PASP 85 mmHg.    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   She had an atrial fibrillation ablation in 6/18.   RHC in 11/18 showed lower PA pressure than in the past with PA 54/16 and PVR 3.7 WU.  Echo showed preserved LV systolic function and normal RV size and function.  Holter showed PACs and PVCs, no atrial fibrillation.  I started her on Toprol XL.  CT chest did not show any significant lung findings.   She returns today for followup of diastolic CHF and pulmonary hypertension. She is doing better currently than she was in the fall, able to walk further now.  Breathing is better.  She does a 6 minute walk at home 3 times a week and denies dyspnea with this. Occasional cough. Less palpitations.  No lightheadedness, no chest pain, no orthopnea/PND. Weight down 2 lbs.   6 minute walk (2/17): 141 m 6 minute walk (3/17): 182 m 6  minute walk (5/17): 229 m 6 minute walk (10/17): 305 m 6 minute walk (5/18): 256 m 6 minute walk (8/18): 427 m 6 minute walk (11/18): 61 m, oxygen saturation dropped as low as 70s.  6 minute walk (3/19): 260 m  Labs (12/16): BNP 1187, ANA 1:640, TSH elevated. Labs (2/17): K 5.4 => 3.9, creatinine 1.78 => 1.66, LDL 112, BNP 880 Labs (4/17): K 4.5, creatinine 1.76 Labs (5/17): K 4.1, creatinine 1.79 Labs (8/17): K 3.1, creatinine 1.7, BNP 404 Labs (9/17): K 3.9, creatinine 1.8, BNP 512 Labs (2/18): K 3.5, creatinine 1.71, BNP 157 Labs (5/18): LDL 120 Labs (6/18): K 3.6, creatinine 1.85 Labs (8/18): K 3.5, creatinine 1.78, BNP 88 Labs (11/18): K 3.6 => 3.2, creatinine 1.84 => 1.7, hgb 12.7 => 11.2. Labs (3/19): K 3.8, creatinine 1.74, LDL 123, HDL 56, TSH normal  PMH:  1. CKD stage III 2. Bradycardia in setting of metoprolol + amiodarone use.  3. Raynauds phenomenon. 4. BPPV 5. OA right hip 6. Hypothyroidism 7. Atrial fibrillation: Paroxysmal.  She was on amiodarone in the past but this was stopped when she became more short of breath.  TEE-guided DCCV in 6/16.  - Atrial fibrillation ablation 6/18.  - Holter (12/18) with PACs/PVCs, no atrial fibrillation.  8. HTN 9. Chronic diastolic CHF with prominent RV failure: She has pulmonary arterial HTN that  contributes to RV failure, but there is also a component of diastolic CHF with elevated PCWP on RHC. - TEE (6/16) with EF 45-50%, mildly dilated RV with mildly decreased systolic function, PASP 36 mmHg.  - RHC (1/17) with mean RA 9, PA 80/29 mean 52, mean PCWP 27, CI 1.97 Fick/1.8 thermo, PVR 7.6 Fick/8.4 thermo.  - Echo (6/17): EF 60-65%, mild MR, mild RV dilation with moderately decreased systolic function, moderate TR, PASP 85 mmHg.  - Echo (6/18): EF 16-10%, normal diastolic function, normal RV size and systolic function, PASP 36 mmHg. - Echo (12/18): EF 60-65%, grade II diastolic dysfunction, mild MR, normal RV size and systolic  function, PASP 52 mmHg.  - RHC (11/18): mean RA 2, PA 54/16 mean 32, mean PCWP 9, CI 3.83, PVR 3.7 WU.  10. Pulmonary hypertension: See RHC above.  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  - TEE (6/16) with mildly dilated RV with mildly decreased systolic function. - ANA 9:604, anti-centromere antibody elevated, anti-SCL-70 antibody negative, h/o Raynauds - PFTs (7/16) with minimal obstructive defect but moderately decreased DLCO.  - V/Q scan (2/17) negative for acute or chronic PE.  11. Cardiolite (9/16) with no ischemia/infarction.  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  13. Subclinical hypothyroidism.  14. OSA: Severe on 9/17 sleep study. Now using CPAP.   SH: Married, lives in [place], no smoking, no ETOH.   FH: Adopted.  Daughter has Raynaud's.  ROS: All systems reviewed and negative except as per HPI  Current Outpatient Medications  Medication Sig Dispense Refill  . acetaminophen (TYLENOL) 325 MG tablet Take 2 tablets (650 mg total) by mouth every 4 (four) hours as needed for headache or mild pain.    Marland Kitchen apixaban (ELIQUIS) 5 MG TABS tablet Take 1 tablet (5 mg total) by mouth 2 (two) times daily. 180 tablet 1  . conjugated estrogens (PREMARIN) vaginal cream Place 1 Applicatorful every Saturday vaginally. Using 1.5 grams weekly    . famotidine (PEPCID) 20 MG tablet Take 20 mg by mouth 2 (two) times daily. 10 mg at bedtime    . fluticasone (FLONASE) 50 MCG/ACT nasal spray Place 1 spray daily as needed into both nostrils for allergies or rhinitis.    . furosemide (LASIX) 40 MG tablet Take 2 tablets by mouth in the morning and 1 tablet in the afternoon    . Macitentan (OPSUMIT) 10 MG TABS Take 1 tablet (10 mg total) by mouth daily. 30 tablet 11  . metoprolol succinate (TOPROL-XL) 25 MG 24 hr tablet Take 1 tablet (25 mg total) by mouth daily. 90 tablet 3  . potassium chloride SA (KLOR-CON M20) 20 MEQ tablet Take 2 tablets (40 mEq total) by mouth daily. 180 tablet 3  . Probiotic  Product (PROBIOTIC-10) CAPS Take 1 capsule by mouth daily.     . Riociguat (ADEMPAS) 2.5 MG TABS Take 2.5 mg by mouth 3 (three) times daily. 90 tablet 3  . Selexipag (UPTRAVI) 600 MCG TABS Take 600 mcg by mouth 2 (two) times daily. 60 tablet 11  . sertraline (ZOLOFT) 25 MG tablet Take 1 tablet (25 mg total) by mouth daily. 30 tablet 3   No current facility-administered medications for this encounter.    BP 107/62   Pulse 73   Wt 149 lb 12.8 oz (67.9 kg)   SpO2 93%   BMI 29.26 kg/m   General: NAD Neck: No JVD, no thyromegaly or thyroid nodule.  Lungs: Clear to auscultation bilaterally with normal respiratory effort. CV: Nondisplaced  PMI.  Heart regular S1/S2, no S3/S4, no murmur.  No peripheral edema.  No carotid bruit.  Normal pedal pulses.  Abdomen: Soft, nontender, no hepatosplenomegaly, no distention.  Skin: Intact without lesions or rashes.  Neurologic: Alert and oriented x 3.  Psych: Normal affect. Extremities: No clubbing or cyanosis.  HEENT: Normal.   Assessment/Plan: 1. Pulmonary hypertension: Patient has pulmonary arterial hypertension.  She appears to have co-existing diastolic LV dysfunction given elevated PCWP on prior RHC in 1/17.  PFTs did not show significant obstruction, they only showed moderately decreased DLCO consistent with pulmonary vascular disease. V/Q scan did not show evidence for chronic PE.  PVR 7.6 WU by Fick and 8.4 WU by thermodiluation on 1/17 RHC.  Cardiac index was low, 1.97 Fick/1.8 thermo. 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  Most recent echo in 12/18 showed normal RV size and systolic function with PASP 52 mmHg. Given worsening symptoms, RHC was repeated in 11/18.  This showed PVR 3.7 WU with mean PA pressure 32 and normal CI.  She has been doing better over the last couple of months  and is exercising more, think deconditioning and anxiety played a significant role in her dyspnea as objectively she appears to be improving. 6 minute walk much better today.  - Continue riociguat 2.5 mg tid.  - She has sleep apnea, now using CPAP.  - Continue Opsumit and Selexipag, unable to increase Selexipag beyond 600 mcg bid due to side effects.   2. Chronic diastolic CHF: Elevated PCWP on 1/17 RHC.  Last echo in 12/18 with EF 60-65%. NYHA class II symptoms. She is not volume overloaded.   - Continue Lasix 80 qam/40 qpm.  Recent BMET with stable creatinine reviewed.   3. Atrial fibrillation: Paroxysmal.  She is in NSR today.  She had atrial fibrillation ablation in 6/18. Based on holter in 12/18, her palpitations are likely due to PACs/PVCs. Decreased palpitations on Toprol XL. - Continue Toprol XL 25 mg daily.  - Continue apixaban - Tikosyn would be an option for arrhythmia control if needed in the future, but would be more difficult due to impaired renal function.  4. CKD: Stage III.  5. Thyroid nodule: See on CT chest, needs thyroid US (ordered today).  6. Depression/anxiety: She wants to taper off sertraline.  I will decrease it to 25 mg daily today.     Followup in 3 months.   Loralie Champagne 10/21/2017

## 2017-10-21 NOTE — Telephone Encounter (Signed)
Left pt message giving thyroid ultra sound appt 10/25/17 @ 12:30pm

## 2017-10-21 NOTE — Patient Instructions (Signed)
Decrease Sertraline 25 mg daily  We did a 6 minute walk  Thyroid ultrasound (they will call you)   Your physician recommends that you schedule a follow-up appointment in: 3 months with Dr. [name]

## 2017-10-22 ENCOUNTER — Other Ambulatory Visit: Payer: Self-pay

## 2017-10-22 LAB — HM MAMMOGRAPHY

## 2017-10-25 ENCOUNTER — Ambulatory Visit (HOSPITAL_COMMUNITY)
Admission: RE | Admit: 2017-10-25 | Discharge: 2017-10-25 | Disposition: A | Discharge status: Home / Self Care | Payer: Medicare Other | Source: Ambulatory Visit | Attending: Cardiology | Admitting: Cardiology

## 2017-10-25 DIAGNOSIS — E042 Nontoxic multinodular goiter: Secondary | ICD-10-CM | POA: Diagnosis not present

## 2017-10-25 DIAGNOSIS — E038 Other specified hypothyroidism: Secondary | ICD-10-CM | POA: Diagnosis not present

## 2017-10-26 ENCOUNTER — Other Ambulatory Visit (HOSPITAL_COMMUNITY): Payer: Self-pay | Admitting: *Deleted

## 2017-10-26 MED ORDER — SERTRALINE HCL 25 MG PO TABS: 25.0000 mg | ORAL_TABLET | Freq: Every day | ORAL | 3 refills | Status: DC

## 2017-10-27 ENCOUNTER — Encounter: Payer: Self-pay | Admitting: Internal Medicine

## 2017-11-01 ENCOUNTER — Other Ambulatory Visit: Payer: Self-pay | Admitting: Internal Medicine

## 2017-11-29 ENCOUNTER — Other Ambulatory Visit: Payer: Self-pay | Admitting: Cardiology

## 2018-01-14 ENCOUNTER — Ambulatory Visit (HOSPITAL_COMMUNITY)
Admission: RE | Admit: 2018-01-14 | Discharge: 2018-01-14 | Disposition: A | Discharge status: Home / Self Care | Payer: Medicare Other | Source: Ambulatory Visit | Attending: Cardiology | Admitting: Cardiology

## 2018-01-14 ENCOUNTER — Encounter: Payer: Self-pay | Admitting: *Deleted

## 2018-01-14 ENCOUNTER — Encounter (HOSPITAL_COMMUNITY): Payer: Self-pay | Admitting: Cardiology

## 2018-01-14 VITALS — BP 114/66 | HR 88 | Wt 149.8 lb

## 2018-01-14 DIAGNOSIS — K219 Gastro-esophageal reflux disease without esophagitis: Secondary | ICD-10-CM | POA: Insufficient documentation

## 2018-01-14 DIAGNOSIS — E041 Nontoxic single thyroid nodule: Secondary | ICD-10-CM | POA: Diagnosis not present

## 2018-01-14 DIAGNOSIS — I272 Pulmonary hypertension, unspecified: Secondary | ICD-10-CM

## 2018-01-14 DIAGNOSIS — I493 Ventricular premature depolarization: Secondary | ICD-10-CM | POA: Diagnosis not present

## 2018-01-14 DIAGNOSIS — I5032 Chronic diastolic (congestive) heart failure: Secondary | ICD-10-CM | POA: Insufficient documentation

## 2018-01-14 DIAGNOSIS — H8110 Benign paroxysmal vertigo, unspecified ear: Secondary | ICD-10-CM | POA: Diagnosis not present

## 2018-01-14 DIAGNOSIS — N183 Chronic kidney disease, stage 3 (moderate): Secondary | ICD-10-CM | POA: Diagnosis not present

## 2018-01-14 DIAGNOSIS — I7300 Raynaud's syndrome without gangrene: Secondary | ICD-10-CM | POA: Insufficient documentation

## 2018-01-14 DIAGNOSIS — I480 Paroxysmal atrial fibrillation: Secondary | ICD-10-CM | POA: Insufficient documentation

## 2018-01-14 DIAGNOSIS — I2721 Secondary pulmonary arterial hypertension: Secondary | ICD-10-CM | POA: Insufficient documentation

## 2018-01-14 DIAGNOSIS — Z7901 Long term (current) use of anticoagulants: Secondary | ICD-10-CM | POA: Insufficient documentation

## 2018-01-14 DIAGNOSIS — Z006 Encounter for examination for normal comparison and control in clinical research program: Secondary | ICD-10-CM

## 2018-01-14 DIAGNOSIS — I130 Hypertensive heart and chronic kidney disease with heart failure and stage 1 through stage 4 chronic kidney disease, or unspecified chronic kidney disease: Secondary | ICD-10-CM | POA: Diagnosis not present

## 2018-01-14 DIAGNOSIS — E039 Hypothyroidism, unspecified: Secondary | ICD-10-CM | POA: Insufficient documentation

## 2018-01-14 DIAGNOSIS — Z9889 Other specified postprocedural states: Secondary | ICD-10-CM | POA: Insufficient documentation

## 2018-01-14 DIAGNOSIS — G4730 Sleep apnea, unspecified: Secondary | ICD-10-CM | POA: Insufficient documentation

## 2018-01-14 DIAGNOSIS — Z79899 Other long term (current) drug therapy: Secondary | ICD-10-CM | POA: Insufficient documentation

## 2018-01-14 LAB — BASIC METABOLIC PANEL
Anion gap: 10 (ref 5–15)
BUN: 33 mg/dL — ABNORMAL HIGH (ref 6–20)
CO2: 26 mmol/L (ref 22–32)
Calcium: 9.5 mg/dL (ref 8.9–10.3)
Chloride: 105 mmol/L (ref 101–111)
Creatinine, Ser: 2.07 mg/dL — ABNORMAL HIGH (ref 0.44–1.00)
GFR calc Af Amer: 26 mL/min — ABNORMAL LOW (ref >60)
GFR calc non Af Amer: 23 mL/min — ABNORMAL LOW (ref >60)
Glucose, Bld: 104 mg/dL — ABNORMAL HIGH (ref 65–99)
Potassium: 4.2 mmol/L (ref 3.5–5.1)
Sodium: 141 mmol/L (ref 135–145)

## 2018-01-14 MED ORDER — FAMOTIDINE 20 MG PO TABS: 20.0000 mg | ORAL_TABLET | Freq: Two times a day (BID) | ORAL | 6 refills | Status: DC

## 2018-01-14 NOTE — Progress Notes (Signed)
Tiffany [name] met criteria for the [facility] trial. The study was explained to the patient. The patient was given the consent to read and had an opportunity to ask questions. The patient signed consent and a copy was given to her . No study related procedures were performed prior to the informed consent.

## 2018-01-14 NOTE — Progress Notes (Signed)
6 min walk completed,  Pt ambulated 1220 ft (371.8 m), o2 sats ranged 96-89% on RA, HR ranged 81-127.

## 2018-01-14 NOTE — Patient Instructions (Signed)
Increase Famotidine to 20 mg Twice daily   Lab today  Your physician recommends that you schedule a follow-up appointment in: 3 months

## 2018-01-14 NOTE — Progress Notes (Signed)
Patient ID: Tiffany [name], female   DOB: [birth year], 72 y.o.   MRN: [id number] PCP: Dr. Sharlet Salina HF Cardiology: Dr. [name]  72 y.o. with history of paroxysmal atrial fibrillation and diastolic CHF was noted to have significant pulmonary hypertension and exertional dyspnea.  She has history of paroxysmal atrial fibrillation and had an episode in 6/16 requiring cardioversion.  She was started on amiodarone but this was stopped with worsening breathing.  Cardiolite in 9/16 showed on ischemia or infarction.     I had her do a RHC in 1/17.  This showed elevated left and right heart filling pressures but also PAH. After cath, she increased her Lasix from 40 qod to 40 daily.  At a prior appointment, I increased Lasix to 40 mg bid and also started her on Opsumit.  At next appointment, I added Adcirca 20 and then titrated it up to 40 mg daily. She continue to be volume overloaded, so  I increased Lasix to 80 qam/40 qpm. She has seemed to do well on this dose.   Echo was done in 6/17, RV mildly dilated with moderately decreased systolic function and PASP 85 mmHg.    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   She had an atrial fibrillation ablation in 6/18.   RHC in 11/18 showed lower PA pressure than in the past with PA 54/16 and PVR 3.7 WU.  Echo showed preserved LV systolic function and normal RV size and function.  Holter showed PACs and PVCs, no atrial fibrillation.  I started her on Toprol XL.  CT chest did not show any significant lung findings.   She returns today for followup of diastolic CHF and pulmonary hypertension. She seems to be doing well overall.  She has her husband time 6 minute walks for her at home, she can go about 1300 feet.  She is short of breath when she finishes.  Exercise tolerance is improved compared to earlier in the year.  Weight is stable.  No orthopnea/PND.  Able to go shopping, go out and get mail, garden without problems.  No chest pain.  She  gets GERD and is taking famotidine, cannot take PPI as they interact with Adempas.  She will feel atrial fibrillation periodically, generally lasts < 1 hour.  It fatigues her when it occurs.    6 minute walk (2/17): 141 m 6 minute walk (3/17): 182 m 6 minute walk (5/17): 229 m 6 minute walk (10/17): 305 m 6 minute walk (5/18): 256 m 6 minute walk (8/18): 427 m 6 minute walk (11/18): 61 m, oxygen saturation dropped as low as 70s.  6 minute walk (3/19): 260 m 6 minute walk (6/19): [place] (12/16): BNP 1187, ANA 1:640, TSH elevated. Labs (2/17): K 5.4 => 3.9, creatinine 1.78 => 1.66, LDL 112, BNP 880 Labs (4/17): K 4.5, creatinine 1.76 Labs (5/17): K 4.1, creatinine 1.79 Labs (8/17): K 3.1, creatinine 1.7, BNP 404 Labs (9/17): K 3.9, creatinine 1.8, BNP 512 Labs (2/18): K 3.5, creatinine 1.71, BNP 157 Labs (5/18): LDL 120 Labs (6/18): K 3.6, creatinine 1.85 Labs (8/18): K 3.5, creatinine 1.78, BNP 88 Labs (11/18): K 3.6 => 3.2, creatinine 1.84 => 1.7, hgb 12.7 => 11.2. Labs (3/19): K 3.8, creatinine 1.74, LDL 123, HDL 56, TSH normal  ECG (personally reviewed): NSR, poor RWP, QTc 470 msec  PMH:  1. CKD stage III 2. Bradycardia in setting of metoprolol + amiodarone use.  3. Raynauds phenomenon.  4. BPPV 5. OA right hip 6. Hypothyroidism 7. Atrial fibrillation: Paroxysmal.  She was on amiodarone in the past but this was stopped when she became more short of breath.  TEE-guided DCCV in 6/16.  - Atrial fibrillation ablation 6/18.  - Holter (12/18) with PACs/PVCs, no atrial fibrillation.  8. HTN 9. 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 - TEE (6/16) with EF 45-50%, mildly dilated RV with mildly decreased systolic function, PASP 36 mmHg.  - RHC (1/17) with mean RA 9, PA 80/29 mean 52, mean PCWP 27, CI 1.97 Fick/1.8 thermo, PVR 7.6 Fick/8.4  thermo.  - Echo (6/17): EF 60-65%, mild MR, mild RV dilation with moderately decreased systolic function, moderate TR, PASP 85 mmHg.  - Echo (6/18): EF 91-47%, normal diastolic function, normal RV size and systolic function, PASP 36 mmHg. - Echo (12/18): EF 60-65%, grade II diastolic dysfunction, mild MR, normal RV size and systolic function, PASP 52 mmHg.  - RHC (11/18): mean RA 2, PA 54/16 mean 32, mean PCWP 9, CI 3.83, PVR 3.7 WU.  10. Pulmonary hypertension: See RHC above.  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  - TEE (6/16) with mildly dilated RV with mildly decreased systolic function. - ANA 8:295, anti-centromere antibody elevated, anti-SCL-70 antibody negative, h/o Raynauds - PFTs (7/16) with minimal obstructive defect but moderately decreased DLCO.  - V/Q scan (2/17) negative for acute or chronic PE.  11. Cardiolite (9/16) with no ischemia/infarction.  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  13. Subclinical hypothyroidism.  14. OSA: Severe on 9/17 sleep study. Now using CPAP.  15. GERD 16. Thyroid nodule: right nodule seen on 3/19 [place].   SH: Married, lives in [place], no smoking, no ETOH.   FH: Adopted.  Daughter has Raynaud's.  ROS: All systems reviewed and negative except as per HPI  Current Outpatient Medications  Medication Sig Dispense Refill  . acetaminophen (TYLENOL) 325 MG tablet Take 2 tablets (650 mg total) by mouth every 4 (four) hours as needed for headache or mild pain.    Marland Kitchen ELIQUIS 5 MG TABS tablet TAKE 1 TABLET BY MOUTH TWICE A DAY 180 tablet 1  . famotidine (PEPCID) 20 MG tablet Take 1 tablet (20 mg total) by mouth 2 (two) times daily. 60 tablet 6  . fluticasone (FLONASE) 50 MCG/ACT nasal spray Place 1 spray daily as needed into both nostrils for allergies or rhinitis.    . furosemide (LASIX) 40 MG tablet Take 2 tablets by mouth in the morning and 1 tablet in the afternoon    . Macitentan (OPSUMIT) 10 MG TABS Take 1 tablet (10 mg total) by mouth daily. 30  tablet 11  . metoprolol succinate (TOPROL-XL) 25 MG 24 hr tablet Take 1 tablet (25 mg total) by mouth daily. 90 tablet 3  . potassium chloride SA (KLOR-CON M20) 20 MEQ tablet Take 2 tablets (40 mEq total) by mouth daily. 180 tablet 3  . PREMARIN vaginal cream USE 1 APPLICATORFUL 2-3 TIMES WEEKLY 30 g 11  . Probiotic Product (PROBIOTIC-10) CAPS Take 1 capsule by mouth daily.     . Riociguat (ADEMPAS) 2.5 MG TABS Take 2.5 mg by mouth 3 (three) times daily. 90 tablet 3  . Selexipag (UPTRAVI) 600 MCG TABS Take 600 mcg by mouth 2 (two) times daily. 60 tablet 11  . sertraline (ZOLOFT) 25 MG tablet Take 1  tablet (25 mg total) by mouth daily. 90 tablet 3   No current facility-administered medications for this encounter.    BP 114/66   Pulse 88   Wt 149 lb 12.8 oz (67.9 kg)   SpO2 96%   BMI 29.26 kg/m   General: NAD Neck: No JVD, no thyromegaly or thyroid nodule.  Lungs: Clear to auscultation bilaterally with normal respiratory effort. CV: Nondisplaced PMI.  Heart regular S1/S2, no S3/S4, no murmur.  No peripheral edema.  No carotid bruit.  Normal pedal pulses.  Abdomen: Soft, nontender, no hepatosplenomegaly, no distention.  Skin: Intact without lesions or rashes.  Neurologic: Alert and oriented x 3.  Psych: Normal affect. Extremities: No clubbing or cyanosis.  HEENT: Normal.   Assessment/Plan: 1. Pulmonary hypertension: Patient has pulmonary arterial hypertension.  She appears to have co-existing diastolic LV dysfunction given elevated PCWP on prior RHC in 1/17.  PFTs did not show significant obstruction, they only showed moderately decreased DLCO consistent with pulmonary vascular disease. V/Q scan did not show evidence for chronic PE.  PVR 7.6 WU by Fick and 8.4 WU by thermodiluation on 1/17 RHC.  Cardiac index was low, 1.97 Fick/1.8 thermo. 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  ANA 1:640 with elevated anti-centromere antibody and Raynaud's phenomenon, suspect PAH related to rheumatological  disease, incomplete CREST syndrome per Dr Elmon Else last note.  Most recent echo in 12/18 showed normal RV size and systolic function with PASP 52 mmHg. Given worsening symptoms, RHC was repeated in 11/18.  This showed PVR 3.7 WU with mean PA pressure 32 and normal CI.  She is doing better, NYHA class II.  6 minute walk improved.  Able to do all her activities without trouble.  - Continue riociguat 2.5 mg tid.  - She has sleep apnea, now using CPAP.  - Continue Opsumit and Selexipag, unable to increase Selexipag beyond 600 mcg bid due to side effects.   2. Chronic diastolic CHF: Elevated PCWP on 1/17 RHC.  Last echo in 12/18 with EF 60-65%. NYHA class II symptoms. She is not volume overloaded.   - Continue Lasix 80 qam/40 qpm.  Repeat BMET.  If creatinine higher, may drop pm Lasix dose.   3. Atrial fibrillation: Paroxysmal.  She is in NSR today.  She had atrial fibrillation ablation in 6/18. Based on holter in 12/18, her palpitations are likely due to PACs/PVCs. Decreased palpitations on Toprol XL. - Continue Toprol XL 25 mg daily.  - Continue apixaban - Tikosyn would be an option for arrhythmia control if needed in the future, but would be more difficult due to impaired renal function.  4. CKD: Stage III. BMET today.  5. Thyroid nodule: Needs repeat thyroid US to follow nodule in 3/20.  6. Depression/anxiety: She is tapering slowly off sertraline.      Followup in 3 months.   Loralie Champagne 01/14/2018

## 2018-01-18 ENCOUNTER — Telehealth (HOSPITAL_COMMUNITY): Payer: Self-pay | Admitting: *Deleted

## 2018-01-18 DIAGNOSIS — I5032 Chronic diastolic (congestive) heart failure: Secondary | ICD-10-CM

## 2018-01-18 MED ORDER — FUROSEMIDE 40 MG PO TABS: 80.0000 mg | ORAL_TABLET | Freq: Every day | ORAL | Status: DC

## 2018-01-18 NOTE — Telephone Encounter (Signed)
-----   Message from Larey Dresser, MD sent at 01/14/2018  4:10 PM EDT ----- Creatinine is up, decrease Lasix to 80 mg daily.  Can take 40 mg in the afternoon x 2 days if weight rises > 3 lbs in a week or > 2 lbs in 24 hrs.  BMET 1 week.

## 2018-01-18 NOTE — Telephone Encounter (Signed)
Notes recorded by Scarlette Calico, RN on 01/18/2018 at 4:56 PM EDT Pt aware, agreeable and verbalizes understanding, repeat lab sch 6/24 ------  Notes recorded by Scarlette Calico, RN on 01/14/2018 at 5:01 PM EDT Left message to call back

## 2018-01-24 ENCOUNTER — Ambulatory Visit (HOSPITAL_COMMUNITY)
Admission: RE | Admit: 2018-01-24 | Discharge: 2018-01-24 | Disposition: A | Discharge status: Home / Self Care | Payer: Medicare Other | Source: Ambulatory Visit | Attending: Internal Medicine | Admitting: Internal Medicine

## 2018-01-24 DIAGNOSIS — I5032 Chronic diastolic (congestive) heart failure: Secondary | ICD-10-CM

## 2018-01-24 LAB — BASIC METABOLIC PANEL
Anion gap: 10 (ref 5–15)
BUN: 30 mg/dL — ABNORMAL HIGH (ref 6–20)
CO2: 24 mmol/L (ref 22–32)
Calcium: 9.5 mg/dL (ref 8.9–10.3)
Chloride: 106 mmol/L (ref 101–111)
Creatinine, Ser: 1.91 mg/dL — ABNORMAL HIGH (ref 0.44–1.00)
GFR calc Af Amer: 29 mL/min — ABNORMAL LOW (ref >60)
GFR calc non Af Amer: 25 mL/min — ABNORMAL LOW (ref >60)
Glucose, Bld: 105 mg/dL — ABNORMAL HIGH (ref 65–99)
Potassium: 3.5 mmol/L (ref 3.5–5.1)
Sodium: 140 mmol/L (ref 135–145)

## 2018-02-18 ENCOUNTER — Encounter: Payer: Self-pay | Admitting: *Deleted

## 2018-03-04 ENCOUNTER — Other Ambulatory Visit (HOSPITAL_COMMUNITY): Payer: Self-pay

## 2018-03-04 MED ORDER — RIOCIGUAT 2.5 MG PO TABS: 2.5000 mg | ORAL_TABLET | Freq: Three times a day (TID) | ORAL | 3 refills | Status: DC

## 2018-03-07 ENCOUNTER — Ambulatory Visit: Payer: Medicare Other | Admitting: Internal Medicine

## 2018-03-07 ENCOUNTER — Encounter: Payer: Self-pay | Admitting: Internal Medicine

## 2018-03-07 VITALS — BP 116/70 | HR 67 | Ht 60.0 in | Wt 149.4 lb

## 2018-03-07 DIAGNOSIS — I272 Pulmonary hypertension, unspecified: Secondary | ICD-10-CM | POA: Diagnosis not present

## 2018-03-07 DIAGNOSIS — G4733 Obstructive sleep apnea (adult) (pediatric): Secondary | ICD-10-CM

## 2018-03-07 DIAGNOSIS — I10 Essential (primary) hypertension: Secondary | ICD-10-CM

## 2018-03-07 DIAGNOSIS — I480 Paroxysmal atrial fibrillation: Secondary | ICD-10-CM | POA: Diagnosis not present

## 2018-03-07 NOTE — Progress Notes (Signed)
PCP: [name], MD Primary Cardiologist: Dr [name] Primary EP: Dr Lemmie Evens is a 72 y.o. female who presents today for routine electrophysiology followup.  Since last being seen in our clinic, the patient reports doing reasonably well.  She has chronic difficulty with SOB.  She does not seem to be able to accept that she has a chronic condition.  AF appears to be reasonably controlled.  She has 2-3 episodes per month, typically lasting less than 3 hours.  She thinks that episodes are brought on by bending, lifting, and exertion. Today, she denies symptoms of palpitations, chest pain,  lower extremity edema, dizziness, presyncope, or syncope.  The patient is otherwise without complaint today.   Past Medical History:  Diagnosis Date  . Arthritis 04-20-12   Osteoarthritis-right hip  . Atrial fibrillation status post cardioversion, 01/30/15 maintaining SR.  01/31/2015   a. 01/2015 s/p TEE/DCCV;  b. 02/06/2015 recurrent AF noted, amio added;  c. CHA2DS2VASc = 3-->eliquis.  Marland Kitchen BENIGN POSITIONAL VERTIGO   . Cataract   . Complication of anesthesia 04-20-12   some issues with prolonged sedation after anesthesia  . Diverticulosis   . DYSLIPIDEMIA   . Esophageal stricture   . GERD   . HYPERTENSION    a. severe, pt intol of med tx, Pt. has severe "whitecoat" syndrome and refused medical therapy.  . Hypothyroidism 09/29/2014   a. pt did not tolerate synthroid and this was subsequently discontinued.  . Mild LV dysfunction    a. 01/2015 Echo: EF 45-50%, mild MR, mildly dil LA, mod dil RV with mod to sev reduced fxn, mod-sev TR, PASP 25mmHg.  . Osteopenia   . Raynaud disease   . URINARY INCONTINENCE 04-20-12   occ. with nighttime sleep pattern   Past Surgical History:  Procedure Laterality Date  . ATRIAL FIBRILLATION ABLATION N/A 01/26/2017   Procedure: Atrial Fibrillation Ablation;  Surgeon: Thompson Grayer, MD;  Location: [facility];  Service: Cardiovascular;  Laterality:  N/A;  . breast ultrasound Right 09/13/13   There is no sonographic evidence of malignancy. the 5.7DU complicated cyst in (R) breast is consistent with a benign finding. repeat in 1 year  . CARDIAC CATHETERIZATION N/A 08/29/2015   Procedure: Right Heart Cath;  Surgeon: Larey Dresser, MD;  Location: [facility];  Service: Cardiovascular;  Laterality: N/A;  . CARDIOVERSION N/A 01/30/2015   Procedure: CARDIOVERSION;  Surgeon: Sanda Klein, MD;  Location: [facility];  Service: Cardiovascular;  Laterality: N/A;  . CHOLECYSTECTOMY     '90-"sludge"  . NASAL FRACTURE SURGERY  2006  . RIGHT HEART CATH N/A 06/30/2017   Procedure: RIGHT HEART CATH;  Surgeon: Larey Dresser, MD;  Location: [facility];  Service: Cardiovascular;  Laterality: N/A;  . TEE WITHOUT CARDIOVERSION N/A 01/30/2015   Procedure: TRANSESOPHAGEAL ECHOCARDIOGRAM (TEE);  Surgeon: Sanda Klein, MD;  Location[facility];  Service: Cardiovascular;  Laterality: N/A;  . TEE WITHOUT CARDIOVERSION N/A 01/25/2017   Procedure: TRANSESOPHAGEAL ECHOCARDIOGRAM (TEE);  Surgeon: Fay Records, MD;  Location: [facility] ENDOSCOPY;  Service: Cardiovascular;  Laterality: N/A;  . TOTAL HIP ARTHROPLASTY  04/26/2012   Procedure: TOTAL HIP ARTHROPLASTY ANTERIOR APPROACH;  Surgeon: Mauri Pole, MD;  Location: [facility];  Service: Orthopedics;  Laterality: Right;  . TUBAL LIGATION  1980    ROS- all systems are reviewed and negatives except as per HPI above  Current Outpatient Medications  Medication Sig Dispense Refill  . acetaminophen (TYLENOL) 325 MG  tablet Take 2 tablets (650 mg total) by mouth every 4 (four) hours as needed for headache or mild pain.    Marland Kitchen ELIQUIS 5 MG TABS tablet TAKE 1 TABLET BY MOUTH TWICE A DAY 180 tablet 1  . Estrogens, Conjugated (PREMARIN VA) Place 1 application vaginally once a week.    . famotidine (PEPCID) 20 MG tablet Take 1 tablet (20 mg total) by mouth 2 (two) times daily. 60 tablet 6  . fluticasone (FLONASE)  50 MCG/ACT nasal spray Place 1 spray daily as needed into both nostrils for allergies or rhinitis.    . furosemide (LASIX) 40 MG tablet Take 2 tablets (80 mg total) by mouth daily. Can take 1 extra tab in PM for wt gain 30 tablet   . Macitentan (OPSUMIT) 10 MG TABS Take 1 tablet (10 mg total) by mouth daily. 30 tablet 11  . metoprolol succinate (TOPROL-XL) 25 MG 24 hr tablet Take 1 tablet (25 mg total) by mouth daily. 90 tablet 3  . potassium chloride SA (KLOR-CON M20) 20 MEQ tablet Take 2 tablets (40 mEq total) by mouth daily. 180 tablet 3  . Probiotic Product (PROBIOTIC-10) CAPS Take 1 capsule by mouth daily.     . Riociguat (ADEMPAS) 2.5 MG TABS Take 2.5 mg by mouth 3 (three) times daily. 90 tablet 3  . Selexipag (UPTRAVI) 600 MCG TABS Take 600 mcg by mouth 2 (two) times daily. 60 tablet 11   No current facility-administered medications for this visit.     Physical Exam: Vitals:   03/07/18 1156  BP: 116/70  Pulse: 67  SpO2: 95%  Weight: 149 lb 6.4 oz (67.8 kg)  Height: 5' (1.524 m)    GEN- The patient is well appearing, alert and oriented x 3 today.   Head- normocephalic, atraumatic Eyes-  Sclera clear, conjunctiva pink Ears- hearing intact Oropharynx- clear Lungs- Clear to ausculation bilaterally, normal work of breathing Heart- Regular rate and rhythm, 2/6 SEM LUSB GI- soft, NT, ND, + BS Extremities- no clubbing, cyanosis, or edema  Wt Readings from Last 3 Encounters:  03/07/18 149 lb 6.4 oz (67.8 kg)  01/14/18 149 lb 12.8 oz (67.9 kg)  10/21/17 149 lb 12.8 oz (67.9 kg)    EKG tracing ordered today is personally reviewed and shows sinus rhythm 67 bpm, PR 148 msec, QRS 86 msec, QTc 445 msec, poor r wave progression  Assessment and Plan:  1. Paroxysmal atrial fibrillation Doing well s/p ablation She has occasional afib.  I have advised ILR to better characterize her symptoms. She is not interested at this time. I would not advise AADs or repeat ablation given her  current AF burden. On eliquis for chads2vasc score of 3  2. OSA Compliance with CPAP encouarged  3. HTN Stable No change required today  4. Pulmonary hypertension/ diastolic dysfunction Per Dr [name]  Return to see [name] in AF clinic in 6 months I will see in a year  Thompson Grayer MD, [facility] 03/07/2018 12:15 PM

## 2018-03-07 NOTE — Patient Instructions (Addendum)
Medication Instructions:  Your physician recommends that you continue on your current medications as directed. Please refer to the Current Medication list given to you today.  Labwork: None ordered.  Testing/Procedures: None ordered.  Follow-Up: Your physician wants you to follow-up in: 6 months with Roderic Palau, NP at the [place] clinic.  You will receive a reminder letter in the mail two months in advance. If you don't receive a letter, please call our office to schedule the follow-up appointment.  Your physician wants you to follow-up in: 12 months with Dr. [name].  You will receive a reminder letter in the mail two months in advance. If you don't receive a letter, please call our office to schedule the follow-up appointment.   Any Other Special Instructions Will Be Listed Below (If Applicable).  If you need a refill on your cardiac medications before your next appointment, please call your pharmacy.

## 2018-04-11 ENCOUNTER — Other Ambulatory Visit (HOSPITAL_COMMUNITY): Payer: Self-pay | Admitting: Internal Medicine

## 2018-04-15 ENCOUNTER — Other Ambulatory Visit (HOSPITAL_COMMUNITY): Payer: Self-pay

## 2018-04-15 MED ORDER — SELEXIPAG 600 MCG PO TABS: 600.0000 ug | ORAL_TABLET | Freq: Two times a day (BID) | ORAL | 2 refills | Status: DC

## 2018-04-18 ENCOUNTER — Ambulatory Visit (HOSPITAL_COMMUNITY)
Admission: RE | Admit: 2018-04-18 | Discharge: 2018-04-18 | Disposition: A | Discharge status: Home / Self Care | Payer: Medicare Other | Source: Ambulatory Visit | Attending: Cardiology | Admitting: Cardiology

## 2018-04-18 ENCOUNTER — Encounter (HOSPITAL_COMMUNITY): Payer: Self-pay | Admitting: Cardiology

## 2018-04-18 VITALS — BP 111/66 | HR 70 | Wt 150.0 lb

## 2018-04-18 DIAGNOSIS — I130 Hypertensive heart and chronic kidney disease with heart failure and stage 1 through stage 4 chronic kidney disease, or unspecified chronic kidney disease: Secondary | ICD-10-CM | POA: Insufficient documentation

## 2018-04-18 DIAGNOSIS — Z79899 Other long term (current) drug therapy: Secondary | ICD-10-CM | POA: Insufficient documentation

## 2018-04-18 DIAGNOSIS — I480 Paroxysmal atrial fibrillation: Secondary | ICD-10-CM | POA: Insufficient documentation

## 2018-04-18 DIAGNOSIS — K219 Gastro-esophageal reflux disease without esophagitis: Secondary | ICD-10-CM | POA: Diagnosis not present

## 2018-04-18 DIAGNOSIS — I272 Pulmonary hypertension, unspecified: Secondary | ICD-10-CM

## 2018-04-18 DIAGNOSIS — I5032 Chronic diastolic (congestive) heart failure: Secondary | ICD-10-CM

## 2018-04-18 DIAGNOSIS — N183 Chronic kidney disease, stage 3 (moderate): Secondary | ICD-10-CM | POA: Insufficient documentation

## 2018-04-18 DIAGNOSIS — M1611 Unilateral primary osteoarthritis, right hip: Secondary | ICD-10-CM | POA: Insufficient documentation

## 2018-04-18 DIAGNOSIS — I2721 Secondary pulmonary arterial hypertension: Secondary | ICD-10-CM | POA: Diagnosis not present

## 2018-04-18 DIAGNOSIS — E039 Hypothyroidism, unspecified: Secondary | ICD-10-CM | POA: Diagnosis not present

## 2018-04-18 DIAGNOSIS — G4730 Sleep apnea, unspecified: Secondary | ICD-10-CM | POA: Diagnosis not present

## 2018-04-18 DIAGNOSIS — H8110 Benign paroxysmal vertigo, unspecified ear: Secondary | ICD-10-CM | POA: Diagnosis not present

## 2018-04-18 DIAGNOSIS — E041 Nontoxic single thyroid nodule: Secondary | ICD-10-CM | POA: Diagnosis not present

## 2018-04-18 DIAGNOSIS — I7300 Raynaud's syndrome without gangrene: Secondary | ICD-10-CM | POA: Diagnosis not present

## 2018-04-18 DIAGNOSIS — Z7901 Long term (current) use of anticoagulants: Secondary | ICD-10-CM | POA: Insufficient documentation

## 2018-04-18 LAB — BASIC METABOLIC PANEL
Anion gap: 12 (ref 5–15)
BUN: 29 mg/dL — ABNORMAL HIGH (ref 8–23)
CO2: 22 mmol/L (ref 22–32)
Calcium: 9.2 mg/dL (ref 8.9–10.3)
Chloride: 107 mmol/L (ref 98–111)
Creatinine, Ser: 1.9 mg/dL — ABNORMAL HIGH (ref 0.44–1.00)
GFR calc Af Amer: 29 mL/min — ABNORMAL LOW (ref >60)
GFR calc non Af Amer: 25 mL/min — ABNORMAL LOW (ref >60)
Glucose, Bld: 82 mg/dL (ref 70–99)
Potassium: 3.5 mmol/L (ref 3.5–5.1)
Sodium: 141 mmol/L (ref 135–145)

## 2018-04-18 LAB — BRAIN NATRIURETIC PEPTIDE: B Natriuretic Peptide: 245.7 pg/mL — ABNORMAL HIGH (ref 0.0–100.0)

## 2018-04-18 NOTE — Patient Instructions (Signed)
Labs today (will call for abnormal results, otherwise no news is good news)  Echocardiogram and Follow up in December with Dr. [name].

## 2018-04-18 NOTE — Progress Notes (Signed)
Patient ID: Tiffany [name], female   DOB: 05-25-1946, 72 y.o.   MRN: [id number] PCP: Dr. Sharlet Salina HF Cardiology: Dr. [name]  72 y.o. with history of paroxysmal atrial fibrillation and diastolic CHF was noted to have significant pulmonary hypertension and exertional dyspnea.  She has history of paroxysmal atrial fibrillation and had an episode in 6/16 requiring cardioversion.  She was started on amiodarone but this was stopped with worsening breathing.  Cardiolite in 9/16 showed on ischemia or infarction.     I had her do a RHC in 1/17.  This showed elevated left and right heart filling pressures but also PAH. After cath, she increased her Lasix from 40 qod to 40 daily.  At a prior appointment, I increased Lasix to 40 mg bid and also started her on Opsumit.  At next appointment, I added Adcirca 20 and then titrated it up to 40 mg daily. She continue to be volume overloaded, so  I increased Lasix to 80 qam/40 qpm. She has seemed to do well on this dose.   Echo was done in 6/17, RV mildly dilated with moderately decreased systolic function and PASP 85 mmHg.    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   She had an atrial fibrillation ablation in 6/18.   RHC in 11/18 showed lower PA pressure than in the past with PA 54/16 and PVR 3.7 WU.  Echo showed preserved LV systolic function and normal RV size and function.  Holter showed PACs and PVCs, no atrial fibrillation.  I started her on Toprol XL.  CT chest did not show any significant lung findings.   She returns today for followup of diastolic CHF and pulmonary hypertension. She seems stable overall.  She feels occasional atrial fibrillation, every few days.  It lasts for 30 minutes-5 hours.  She saw Dr. [name], plan is to continue to monitor for now.  Breathing has been more difficult in the heat, but generally does ok walking on flat ground, especially when it is not hot.  No orthopnea/PND.  No chest pain.   ECG  (personally reviewed): NSR, poor RWP  6 minute walk (2/17): 141 m 6 minute walk (3/17): 182 m 6 minute walk (5/17): 229 m 6 minute walk (10/17): 305 m 6 minute walk (5/18): 256 m 6 minute walk (8/18): 427 m 6 minute walk (11/18): 61 m, oxygen saturation dropped as low as 70s.  6 minute walk (3/19): 260 m 6 minute walk (6/19): [place] (12/16): BNP 1187, ANA 1:640, TSH elevated. Labs (2/17): K 5.4 => 3.9, creatinine 1.78 => 1.66, LDL 112, BNP 880 Labs (4/17): K 4.5, creatinine 1.76 Labs (5/17): K 4.1, creatinine 1.79 Labs (8/17): K 3.1, creatinine 1.7, BNP 404 Labs (9/17): K 3.9, creatinine 1.8, BNP 512 Labs (2/18): K 3.5, creatinine 1.71, BNP 157 Labs (5/18): LDL 120 Labs (6/18): K 3.6, creatinine 1.85 Labs (8/18): K 3.5, creatinine 1.78, BNP 88 Labs (11/18): K 3.6 => 3.2, creatinine 1.84 => 1.7, hgb 12.7 => 11.2. Labs (3/19): K 3.8, creatinine 1.74, LDL 123, HDL 56, TSH normal Labs (6/19): K 3.5, creatinine 1.91  PMH:  1. CKD stage III 2. Bradycardia in setting of metoprolol + amiodarone use.  3. Raynauds phenomenon. 4. BPPV 5. OA right hip 6. Hypothyroidism 7. Atrial fibrillation: Paroxysmal.  She was on amiodarone in the past but this was stopped when she became more short of breath.  TEE-guided DCCV in 6/16.  - Atrial fibrillation  ablation 6/18.  - Holter (12/18) with PACs/PVCs, no atrial fibrillation.  8. HTN 9. 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 - TEE (6/16) with EF 45-50%, mildly dilated RV with mildly decreased systolic function, PASP 36 mmHg.  - RHC (1/17) with mean RA 9, PA 80/29 mean 52, mean PCWP 27, CI 1.97 Fick/1.8 thermo, PVR 7.6 Fick/8.4 thermo.  - Echo (6/17): EF 60-65%, mild MR, mild RV dilation with moderately decreased systolic function, moderate TR, PASP 85 mmHg.  - Echo (6/18): EF 33-00%, normal  diastolic function, normal RV size and systolic function, PASP 36 mmHg. - Echo (12/18): EF 60-65%, grade II diastolic dysfunction, mild MR, normal RV size and systolic function, PASP 52 mmHg.  - RHC (11/18): mean RA 2, PA 54/16 mean 32, mean PCWP 9, CI 3.83, PVR 3.7 WU.  10. Pulmonary hypertension: See RHC above.  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  - TEE (6/16) with mildly dilated RV with mildly decreased systolic function. - ANA 7:622, anti-centromere antibody elevated, anti-SCL-70 antibody negative, h/o Raynauds - PFTs (7/16) with minimal obstructive defect but moderately decreased DLCO.  - V/Q scan (2/17) negative for acute or chronic PE.  11. Cardiolite (9/16) with no ischemia/infarction.  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  13. Subclinical hypothyroidism.  14. OSA: Severe on 9/17 sleep study. Now using CPAP.  15. GERD 16. Thyroid nodule: right nodule seen on 3/19 [place].   SH: Married, lives in [place], no smoking, no ETOH.   FH: Adopted.  Daughter has Raynaud's.  ROS: All systems reviewed and negative except as per HPI  Current Outpatient Medications  Medication Sig Dispense Refill  . acetaminophen (TYLENOL) 325 MG tablet Take 2 tablets (650 mg total) by mouth every 4 (four) hours as needed for headache or mild pain.    Marland Kitchen ELIQUIS 5 MG TABS tablet TAKE 1 TABLET BY MOUTH TWICE A DAY 180 tablet 1  . Estrogens, Conjugated (PREMARIN VA) Place 1 application vaginally once a week.    . famotidine (PEPCID) 20 MG tablet Take 1 tablet (20 mg total) by mouth 2 (two) times daily. 60 tablet 6  . fluticasone (FLONASE) 50 MCG/ACT nasal spray Place 1 spray daily as needed into both nostrils for allergies or rhinitis.    . furosemide (LASIX) 40 MG tablet Take 2 tablets (80 mg total) by mouth daily. Can take 1 extra tab in PM for wt gain 30 tablet   . Macitentan (OPSUMIT) 10 MG TABS Take 1 tablet (10 mg total) by mouth daily. 30 tablet 11  . metoprolol succinate (TOPROL-XL) 25 MG 24 hr  tablet Take 1 tablet (25 mg total) by mouth daily. 90 tablet 3  . potassium chloride SA (KLOR-CON M20) 20 MEQ tablet Take 2 tablets (40 mEq total) by mouth daily. 180 tablet 3  . Probiotic Product (PROBIOTIC-10) CAPS Take 1 capsule by mouth daily.     . Riociguat (ADEMPAS) 2.5 MG TABS Take 2.5 mg by mouth 3 (three) times daily. 90 tablet 3  . Selexipag (UPTRAVI) 600 MCG TABS Take 600 mcg by mouth 2 (two) times daily. 180 tablet 2   No current facility-administered medications for this encounter.    BP 111/66   Pulse 70   Wt 68 kg (150 lb)   SpO2 97%   BMI 29.29 kg/m   General: NAD Neck: JVP 8 cm,  no thyromegaly or thyroid nodule.  Lungs: Clear to auscultation bilaterally with normal respiratory effort. CV: Nondisplaced PMI.  Heart regular S1/S2, no S3/S4, 2/6 SEM RUSB.  No peripheral edema.  No carotid bruit.  Normal pedal pulses.  Abdomen: Soft, nontender, no hepatosplenomegaly, no distention.  Skin: Intact without lesions or rashes.  Neurologic: Alert and oriented x 3.  Psych: Normal affect. Extremities: No clubbing or cyanosis.  HEENT: Normal.   Assessment/Plan: 1. Pulmonary hypertension: Patient has pulmonary arterial hypertension.  She appears to have co-existing diastolic LV dysfunction given elevated PCWP on prior RHC in 1/17.  PFTs did not show significant obstruction, they only showed moderately decreased DLCO consistent with pulmonary vascular disease. V/Q scan did not show evidence for chronic PE.  PVR 7.6 WU by Fick and 8.4 WU by thermodiluation on 1/17 RHC.  Cardiac index was low, 1.97 Fick/1.8 thermo. 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  Most recent echo in 12/18 showed normal RV size and systolic function with PASP 52 mmHg. Given worsening symptoms, RHC was repeated in 11/18.  This showed PVR 3.7 WU with  mean PA pressure 32 and normal CI.  She is doing better since then, NYHA class II.  Able to do all her activities without trouble, notes more dyspnea/fatigue when she is out in the heat.  - Continue riociguat 2.5 mg tid.  - She has sleep apnea, now using CPAP.  - Continue Opsumit and Selexipag, unable to increase Selexipag beyond 600 mcg bid due to side effects.   - At appointment in 12/19, will need echo and 6 minute walk.  - Check BNP today.  2. Chronic diastolic CHF: Elevated PCWP on 1/17 RHC.  Last echo in 12/18 with EF 60-65%. NYHA class II symptoms. She is probably mildly volume overloaded.   - Continue Lasix 80 mg daily for now without increasing.  She can use prn 40 mg Lasix in the pm. BMET today.   3. Atrial fibrillation: Paroxysmal.  She is in NSR today.  She had atrial fibrillation ablation in 6/18. She feels like she is in atrial fibrillation for short periods every few days. She saw Dr. [name] recently, no plan for antiarrhyhmic or ablation.  - Continue Toprol XL 25 mg daily.  - Continue apixaban - Tikosyn would be an option for arrhythmia control if needed in the future, but would be more difficult due to impaired renal function.  4. CKD: Stage III. BMET today.  5. Thyroid nodule: Needs repeat thyroid US to follow nodule in 3/20.  6. Depression/anxiety: Now off sertraline and doing ok.      Followup in 3 months with echo.   Loralie Champagne 04/18/2018

## 2018-04-20 NOTE — Progress Notes (Signed)
Tiffany [name] - 72 y.o. female MRN [id number]  Date of birth: 1945-11-15  SUBJECTIVE:  Including CC & ROS.  Chief Complaint  Patient presents with  . Follow-up    Tiffany [name] is a 72 y.o. female that is here today for trigger finger follow up. She received an injection in her right thumb on 08/05/17, with improvement. She was wearing a splint. Her thumb locks up during flexion and feels a clicking. At the day progress it improves.  She has a history of carpal tunnel wears a cock-up splint at night.  She denies any radicular symptoms with her pain.  She feels like her pain is worse with in the morning or when she has dependent maneuvers.    Review of Systems  Constitutional: Negative for fever.  HENT: Negative for congestion.   Respiratory: Negative for cough.   Cardiovascular: Negative for chest pain.  Gastrointestinal: Negative for abdominal pain.  Musculoskeletal: Positive for arthralgias.  Skin: Negative for color change.  Neurological: Negative for weakness.  Hematological: Negative for adenopathy.  Psychiatric/Behavioral: Negative for agitation.    HISTORY: Past Medical, Surgical, Social, and Family History Reviewed & Updated per EMR.   Pertinent Historical Findings include:  Past Medical History:  Diagnosis Date  . Arthritis 04-20-12   Osteoarthritis-right hip  . Atrial fibrillation status post cardioversion, 01/30/15 maintaining SR.  01/31/2015   a. 01/2015 s/p TEE/DCCV;  b. 02/06/2015 recurrent AF noted, amio added;  c. CHA2DS2VASc = 3-->eliquis.  Marland Kitchen BENIGN POSITIONAL VERTIGO   . Cataract   . Complication of anesthesia 04-20-12   some issues with prolonged sedation after anesthesia  . Diverticulosis   . DYSLIPIDEMIA   . Esophageal stricture   . GERD   . HYPERTENSION    a. severe, pt intol of med tx, Pt. has severe "whitecoat" syndrome and refused medical therapy.  . Hypothyroidism 09/29/2014   a. pt did not tolerate synthroid and this was subsequently discontinued.  .  Mild LV dysfunction    a. 01/2015 Echo: EF 45-50%, mild MR, mildly dil LA, mod dil RV with mod to sev reduced fxn, mod-sev TR, PASP 4mmHg.  . Osteopenia   . Raynaud disease   . URINARY INCONTINENCE 04-20-12   occ. with nighttime sleep pattern    Past Surgical History:  Procedure Laterality Date  . ATRIAL FIBRILLATION ABLATION N/A 01/26/2017   Procedure: Atrial Fibrillation Ablation;  Surgeon: Thompson Grayer, MD;  Location: [place] [facility];  Service: Cardiovascular;  Laterality: N/A;  . breast ultrasound Right 09/13/13   There is no sonographic evidence of malignancy. the 2.8UX complicated cyst in (R) breast is consistent with a benign finding. repeat in 1 year  . CARDIAC CATHETERIZATION N/A 08/29/2015   Procedure: Right Heart Cath;  Surgeon: Larey Dresser, MD;  Location: [place];  Service: Cardiovascular;  Laterality: N/A;  . CARDIOVERSION N/A 01/30/2015   Procedure: CARDIOVERSION;  Surgeon: Sanda Klein, MD;  Location: [facility];  Service: Cardiovascular;  Laterality: N/A;  . CHOLECYSTECTOMY     '90-"sludge"  . NASAL FRACTURE SURGERY  2006  . RIGHT HEART CATH N/A 06/30/2017   Procedure: RIGHT HEART CATH;  Surgeon: Larey Dresser, MD;  Location: [place] [facility];  Service: Cardiovascular;  Laterality: N/A;  . TEE WITHOUT CARDIOVERSION N/A 01/30/2015   Procedure: TRANSESOPHAGEAL ECHOCARDIOGRAM (TEE);  Surgeon: Sanda Klein, MD;  Location: [facility] ENDOSCOPY;  Service: Cardiovascular;  Laterality: N/A;  . TEE WITHOUT CARDIOVERSION N/A 01/25/2017   Procedure: TRANSESOPHAGEAL ECHOCARDIOGRAM (  TEE);  Surgeon: Fay Records, MD;  Location: [facility]COPY;  Service: Cardiovascular;  Laterality: N/A;  . TOTAL HIP ARTHROPLASTY  04/26/2012   Procedure: TOTAL HIP ARTHROPLASTY ANTERIOR APPROACH;  Surgeon: Mauri Pole, MD;  Location: [facility];  Service: Orthopedics;  Laterality: Right;  . TUBAL LIGATION  1980    Allergies  Allergen Reactions  . Levothyroxine Shortness Of Breath and  Other (See Comments)    Exhaustion also - Per pt her this medication could have not been the reason for her symptoms  . Statins Other (See Comments)    "Pain, weakness and kidney problems"  . Prednisone Other (See Comments)    Severe insomnia  . Penicillins Other (See Comments)    dry mouth Has patient had a PCN reaction causing immediate rash, facial/tongue/throat swelling, SOB or lightheadedness with hypotension: No Has patient had a PCN reaction causing severe rash involving mucus membranes or skin necrosis: No Has patient had a PCN reaction that required hospitalization No Has patient had a PCN reaction occurring within the last 10 years: No If all of the above answers are "NO", then may proceed with Cephalosporin use.     Family History  Adopted: Yes  Problem Relation Age of Onset  . Other Unknown        patient was adopted     Social History   Socioeconomic History  . Marital status: Married    Spouse name: Not on file  . Number of children: 2  . Years of education: Not on file  . Highest education level: Not on file  Occupational History  . Not on file  Social Needs  . Financial resource strain: Not on file  . Food insecurity:    Worry: Not on file    Inability: Not on file  . Transportation needs:    Medical: Not on file    Non-medical: Not on file  Tobacco Use  . Smoking status: Never Smoker  . Smokeless tobacco: Never Used  Substance and Sexual Activity  . Alcohol use: Yes    Alcohol/week: 2.0 standard drinks    Types: 2 Glasses of wine per week    Comment: several glasses wine weekly  . Drug use: No  . Sexual activity: Yes  Lifestyle  . Physical activity:    Days per week: Not on file    Minutes per session: Not on file  . Stress: Not on file  Relationships  . Social connections:    Talks on phone: Not on file    Gets together: Not on file    Attends religious service: Not on file    Active member of club or organization: Not on file     Attends meetings of clubs or organizations: Not on file    Relationship status: Not on file  . Intimate partner violence:    Fear of current or ex partner: Not on file    Emotionally abused: Not on file    Physically abused: Not on file    Forced sexual activity: Not on file  Other Topics Concern  . Not on file  Social History Narrative   She enjoys birding.  Lives at home with husband.   Married.   retired [employer]arry manager, Tax inspector     PHYSICAL EXAM:  VS: BP 124/68   Pulse 66   Ht 5' (1.524 m)   Wt 150 lb (68 kg)   SpO2 99%   BMI 29.29 kg/m  Physical Exam  Gen: NAD, alert, cooperative with exam, well-appearing ENT: normal lips, normal nasal mucosa,  Eye: normal EOM, normal conjunctiva and lids CV:  no edema, +2 pedal pulses   Resp: no accessory muscle use, non-labored,  Skin: no rashes, no areas of induration  Neuro: normal tone, normal sensation to touch Psych:  normal insight, alert and oriented MSK:  Right hand: Tenderness palpation of the MP joint of the thumb. No swelling or ecchymosis. Triggering evident on exam. Neurovascular intact   Aspiration/Injection Procedure Note Tiffany [name] 1946-04-01  Procedure: Injection Indications: Right thumb pain  Procedure Details Consent: Risks of procedure as well as the alternatives and risks of each were explained to the (patient/caregiver).  Consent for procedure obtained. Time Out: Verified patient identification, verified procedure, site/side was marked, verified correct patient position, special equipment/implants available, medications/allergies/relevent history reviewed, required imaging and test results available.  Performed.  The area was cleaned with iodine and alcohol swabs.    The right trigger thumb was injected using 1 cc's of 40 mg Kenalog and 1 cc's of 1% lidocaine with a 25 1 1/2" needle.  Ultrasound was used. Images were obtained in  Long views showing the injection.    A sterile  dressing was applied.  Patient did tolerate procedure well.       ASSESSMENT & PLAN:   Trigger finger of right thumb Had a recurrence of triggering.  Has been injected on previous occasions.  Injections seem to help for period of time. -Injection today. -Placed in a thumb spica splint. -If no improvement can consider surgery.

## 2018-04-21 ENCOUNTER — Encounter: Payer: Self-pay | Admitting: Family Medicine

## 2018-04-21 ENCOUNTER — Ambulatory Visit: Payer: Medicare Other | Admitting: Family Medicine

## 2018-04-21 VITALS — BP 124/68 | HR 66 | Ht 60.0 in | Wt 150.0 lb

## 2018-04-21 DIAGNOSIS — M65311 Trigger thumb, right thumb: Secondary | ICD-10-CM

## 2018-04-21 NOTE — Patient Instructions (Signed)
Good to see you  Please try the brace day and night.  Use it during the day if you're not doing much.

## 2018-04-24 NOTE — Assessment & Plan Note (Signed)
Had a recurrence of triggering.  Has been injected on previous occasions.  Injections seem to help for period of time. -Injection today. -Placed in a thumb spica splint. -If no improvement can consider surgery.

## 2018-05-03 DIAGNOSIS — R682 Dry mouth, unspecified: Secondary | ICD-10-CM | POA: Insufficient documentation

## 2018-05-03 DIAGNOSIS — H9113 Presbycusis, bilateral: Secondary | ICD-10-CM | POA: Insufficient documentation

## 2018-05-24 ENCOUNTER — Other Ambulatory Visit: Payer: Self-pay | Admitting: Internal Medicine

## 2018-07-07 ENCOUNTER — Other Ambulatory Visit (HOSPITAL_COMMUNITY): Payer: Self-pay

## 2018-07-07 MED ORDER — RIOCIGUAT 2.5 MG PO TABS: 2.5000 mg | ORAL_TABLET | Freq: Three times a day (TID) | ORAL | 3 refills | Status: DC

## 2018-07-11 ENCOUNTER — Telehealth (HOSPITAL_COMMUNITY): Payer: Self-pay | Admitting: Pharmacist

## 2018-07-11 NOTE — Telephone Encounter (Signed)
Adempas 2.5 mg TID PA approved by [employer] through 08/03/19.   Ruta Hinds. Velva Harman, PharmD, BCPS, CPP Clinical Pharmacist Phone: [phone] 07/11/2018 11:15 AM

## 2018-07-20 ENCOUNTER — Ambulatory Visit (HOSPITAL_COMMUNITY)
Admission: RE | Admit: 2018-07-20 | Discharge: 2018-07-20 | Disposition: A | Discharge status: Home / Self Care | Payer: Medicare Other | Source: Ambulatory Visit | Attending: Cardiology | Admitting: Cardiology

## 2018-07-20 ENCOUNTER — Other Ambulatory Visit: Payer: Self-pay

## 2018-07-20 ENCOUNTER — Encounter (HOSPITAL_COMMUNITY): Payer: Self-pay | Admitting: Cardiology

## 2018-07-20 ENCOUNTER — Ambulatory Visit (HOSPITAL_BASED_OUTPATIENT_CLINIC_OR_DEPARTMENT_OTHER)
Admission: RE | Admit: 2018-07-20 | Discharge: 2018-07-20 | Disposition: A | Discharge status: Home / Self Care | Payer: Medicare Other | Source: Ambulatory Visit | Attending: Cardiology | Admitting: Cardiology

## 2018-07-20 VITALS — BP 120/62 | HR 72 | Wt 149.6 lb

## 2018-07-20 DIAGNOSIS — I4891 Unspecified atrial fibrillation: Secondary | ICD-10-CM | POA: Diagnosis not present

## 2018-07-20 DIAGNOSIS — I5032 Chronic diastolic (congestive) heart failure: Secondary | ICD-10-CM

## 2018-07-20 DIAGNOSIS — I350 Nonrheumatic aortic (valve) stenosis: Secondary | ICD-10-CM | POA: Diagnosis not present

## 2018-07-20 DIAGNOSIS — I272 Pulmonary hypertension, unspecified: Secondary | ICD-10-CM | POA: Diagnosis not present

## 2018-07-20 LAB — CBC
HCT: 40.7 % (ref 36.0–46.0)
Hemoglobin: 12.8 g/dL (ref 12.0–15.0)
MCH: 29.2 pg (ref 26.0–34.0)
MCHC: 31.4 g/dL (ref 30.0–36.0)
MCV: 92.7 fL (ref 80.0–100.0)
Platelets: 206 10*3/uL (ref 150–400)
RBC: 4.39 MIL/uL (ref 3.87–5.11)
RDW: 15.6 % — ABNORMAL HIGH (ref 11.5–15.5)
WBC: 5.3 10*3/uL (ref 4.0–10.5)
nRBC: 0 % (ref 0.0–0.2)

## 2018-07-20 LAB — BASIC METABOLIC PANEL
Anion gap: 12 (ref 5–15)
BUN: 29 mg/dL — ABNORMAL HIGH (ref 8–23)
CO2: 23 mmol/L (ref 22–32)
Calcium: 9.5 mg/dL (ref 8.9–10.3)
Chloride: 104 mmol/L (ref 98–111)
Creatinine, Ser: 2.07 mg/dL — ABNORMAL HIGH (ref 0.44–1.00)
GFR calc Af Amer: 27 mL/min — ABNORMAL LOW (ref >60)
GFR calc non Af Amer: 23 mL/min — ABNORMAL LOW (ref >60)
Glucose, Bld: 93 mg/dL (ref 70–99)
Potassium: 4 mmol/L (ref 3.5–5.1)
Sodium: 139 mmol/L (ref 135–145)

## 2018-07-20 LAB — BRAIN NATRIURETIC PEPTIDE: B Natriuretic Peptide: 179.1 pg/mL — ABNORMAL HIGH (ref 0.0–100.0)

## 2018-07-20 NOTE — Patient Instructions (Signed)
Labs done today  EKG done today  Follow up with Dr. [name] in 3 months

## 2018-07-20 NOTE — Progress Notes (Signed)
Pt completed 6 min walk. Pt ambulated 1040 ft (317 meters). Her heart rate ranged from 94-127bpm. Oxygen sats ranged from 89-91%. Pt tolerated the walk well.

## 2018-07-20 NOTE — Progress Notes (Signed)
  Echocardiogram 2D Echocardiogram has been performed.  Tiffany [name] 07/20/2018, 10:39 AM

## 2018-07-21 NOTE — Progress Notes (Signed)
Patient ID: Tiffany [name], female   DOB: 1946/04/11, 72 y.o.   MRN: [id number] PCP: Dr. Sharlet Salina HF Cardiology: Dr. [name]  72 y.o. with history of paroxysmal atrial fibrillation and diastolic CHF was noted to have significant pulmonary hypertension and exertional dyspnea.  She has history of paroxysmal atrial fibrillation and had an episode in 6/16 requiring cardioversion.  She was started on amiodarone but this was stopped with worsening breathing.  Cardiolite in 9/16 showed on ischemia or infarction.     I had her do a RHC in 1/17.  This showed elevated left and right heart filling pressures but also PAH. After cath, she increased her Lasix from 40 qod to 40 daily.  At a prior appointment, I increased Lasix to 40 mg bid and also started her on Opsumit.  At next appointment, I added Adcirca 20 and then titrated it up to 40 mg daily. She continue to be volume overloaded, so  I increased Lasix to 80 qam/40 qpm. She has seemed to do well on this dose.   Echo was done in 6/17, RV mildly dilated with moderately decreased systolic function and PASP 85 mmHg.    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   She had an atrial fibrillation ablation in 6/18.   RHC in 11/18 showed lower PA pressure than in the past with PA 54/16 and PVR 3.7 WU.  Echo showed preserved LV systolic function and normal RV size and function.  Holter showed PACs and PVCs, no atrial fibrillation.  I started her on Toprol XL.  CT chest did not show any significant lung findings. Echo in 12/19 showed EF 65-70%, moderate diastolic dysfunction, mild AS, mild MR, PASP 47 mmHg, normal RV.   She returns today for followup of diastolic CHF and pulmonary hypertension. She is doing well overall.  Just getting over URI so was not able to go quite as far on 6 minute walk but still did reasonably well today.  No dyspnea walking on flat ground or up a flight of stairs.  No chest pain.  Able to shop at [place] without  problems. Weight is down 1 lb.   ECG (personally reviewed): NSR, Qs in V1 and V2  6 minute walk (2/17): 141 m 6 minute walk (3/17): 182 m 6 minute walk (5/17): 229 m 6 minute walk (10/17): 305 m 6 minute walk (5/18): 256 m 6 minute walk (8/18): 427 m 6 minute walk (11/18): 61 m, oxygen saturation dropped as low as 70s.  6 minute walk (3/19): 260 m 6 minute walk (6/19): 372 m 6 minute walk (12/19): [place] (12/16): BNP 1187, ANA 1:640, TSH elevated. Labs (2/17): K 5.4 => 3.9, creatinine 1.78 => 1.66, LDL 112, BNP 880 Labs (4/17): K 4.5, creatinine 1.76 Labs (5/17): K 4.1, creatinine 1.79 Labs (8/17): K 3.1, creatinine 1.7, BNP 404 Labs (9/17): K 3.9, creatinine 1.8, BNP 512 Labs (2/18): K 3.5, creatinine 1.71, BNP 157 Labs (5/18): LDL 120 Labs (6/18): K 3.6, creatinine 1.85 Labs (8/18): K 3.5, creatinine 1.78, BNP 88 Labs (11/18): K 3.6 => 3.2, creatinine 1.84 => 1.7, hgb 12.7 => 11.2. Labs (3/19): K 3.8, creatinine 1.74, LDL 123, HDL 56, TSH normal Labs (6/19): K 3.5, creatinine 1.91 Labs (9/19): K 3.5, creatinine 1.9  PMH:  1. CKD stage III 2. Bradycardia in setting of metoprolol + amiodarone use.  3. Raynauds phenomenon. 4. BPPV 5. OA right hip 6. Hypothyroidism 7. Atrial fibrillation:  Paroxysmal.  She was on amiodarone in the past but this was stopped when she became more short of breath.  TEE-guided DCCV in 6/16.  - Atrial fibrillation ablation 6/18.  - Holter (12/18) with PACs/PVCs, no atrial fibrillation.  8. HTN 9. 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 - TEE (6/16) with EF 45-50%, mildly dilated RV with mildly decreased systolic function, PASP 36 mmHg.  - RHC (1/17) with mean RA 9, PA 80/29 mean 52, mean PCWP 27, CI 1.97 Fick/1.8 thermo, PVR 7.6 Fick/8.4 thermo.  - Echo (6/17): EF 60-65%, mild MR, mild RV dilation  with moderately decreased systolic function, moderate TR, PASP 85 mmHg.  - Echo (6/18): EF 57-01%, normal diastolic function, normal RV size and systolic function, PASP 36 mmHg. - Echo (12/18): EF 60-65%, grade II diastolic dysfunction, mild MR, normal RV size and systolic function, PASP 52 mmHg.  - RHC (11/18): mean RA 2, PA 54/16 mean 32, mean PCWP 9, CI 3.83, PVR 3.7 WU.  - Echo (12/19): EF 65-70%, moderate diastolic dysfunction, mild AS, mild MR, PASP 47 mmHg.  10. Pulmonary hypertension: See RHC above.  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  - TEE (6/16) with mildly dilated RV with mildly decreased systolic function. - ANA 7:793, anti-centromere antibody elevated, anti-SCL-70 antibody negative, h/o Raynauds - PFTs (7/16) with minimal obstructive defect but moderately decreased DLCO.  - V/Q scan (2/17) negative for acute or chronic PE.  11. Cardiolite (9/16) with no ischemia/infarction.  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  13. Subclinical hypothyroidism.  14. OSA: Severe on 9/17 sleep study. Now using CPAP.  15. GERD 16. Thyroid nodule: right nodule seen on 3/19 [place].   SH: Married, lives in [place], no smoking, no ETOH.   FH: Adopted.  Daughter has Raynaud's.  ROS: All systems reviewed and negative except as per HPI  Current Outpatient Medications  Medication Sig Dispense Refill  . acetaminophen (TYLENOL) 325 MG tablet Take 2 tablets (650 mg total) by mouth every 4 (four) hours as needed for headache or mild pain.    Marland Kitchen ELIQUIS 5 MG TABS tablet TAKE 1 TABLET BY MOUTH TWICE A DAY 180 tablet 1  . Estrogens, Conjugated (PREMARIN VA) Place 1 application vaginally once a week.    . famotidine (PEPCID) 20 MG tablet Take 1 tablet (20 mg total) by mouth 2 (two) times daily. 60 tablet 6  . fluticasone (FLONASE) 50 MCG/ACT nasal spray Place 1 spray daily as needed into both nostrils for allergies or rhinitis.    . furosemide (LASIX) 40 MG tablet Take 2 tablets (80 mg total) by mouth  daily. Can take 1 extra tab in PM for wt gain 30 tablet   . Macitentan (OPSUMIT) 10 MG TABS Take 1 tablet (10 mg total) by mouth daily. 30 tablet 11  . metoprolol succinate (TOPROL-XL) 25 MG 24 hr tablet Take 1 tablet (25 mg total) by mouth daily. 90 tablet 3  . potassium chloride SA (KLOR-CON M20) 20 MEQ tablet Take 2 tablets (40 mEq total) by mouth daily. 180 tablet 3  . Probiotic Product (PROBIOTIC-10) CAPS Take 1 capsule by mouth daily.     . Riociguat (ADEMPAS) 2.5 MG TABS Take 2.5 mg by mouth 3 (three) times daily. 90 tablet 3  . Selexipag (UPTRAVI) 600 MCG TABS Take 600 mcg by mouth 2 (two) times daily.  180 tablet 2   No current facility-administered medications for this encounter.    BP 120/62   Pulse 72   Wt 67.9 kg (149 lb 9.6 oz)   SpO2 94%   BMI 29.22 kg/m   General: NAD Neck: No JVD, no thyromegaly or thyroid nodule.  Lungs: Clear to auscultation bilaterally with normal respiratory effort. CV: Nondisplaced PMI.  Heart regular S1/S2, no S3/S4, no murmur.  No peripheral edema.  No carotid bruit.  Normal pedal pulses.  Abdomen: Soft, nontender, no hepatosplenomegaly, no distention.  Skin: Intact without lesions or rashes.  Neurologic: Alert and oriented x 3.  Psych: Normal affect. Extremities: No clubbing or cyanosis.  HEENT: Normal.   Assessment/Plan: 1. Pulmonary hypertension: Patient has pulmonary arterial hypertension.  She appears to have co-existing diastolic LV dysfunction given elevated PCWP on prior RHC in 1/17.  PFTs did not show significant obstruction, they only showed moderately decreased DLCO consistent with pulmonary vascular disease. V/Q scan did not show evidence for chronic PE.  PVR 7.6 WU by Fick and 8.4 WU by thermodiluation on 1/17 RHC.  Cardiac index was low, 1.97 Fick/1.8 thermo. 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  ANA 1:640 with elevated anti-centromere antibody and Raynaud's phenomenon, suspect PAH related to rheumatological disease, incomplete CREST  syndrome per Dr Elmon Else last note.  Given worsening symptoms, RHC was repeated in 11/18.  This showed PVR 3.7 WU with mean PA pressure 32 and normal CI.  Most recent echo in 12/19 showed normal RV size and systolic function with PASP 47 mmHg.  Now, she is doing well overall.  NYHA class II symptoms.   - Continue riociguat 2.5 mg tid.  - She has sleep apnea, now using CPAP.  - Continue Opsumit and Selexipag, unable to increase Selexipag beyond 600 mcg bid due to side effects.   - Check BNP today.  2. Chronic diastolic CHF: Elevated PCWP on 1/17 RHC.  Last echo in 12/19 with EF 65-70%. NYHA class II symptoms. No volume overload on exam.  - Continue Lasix 80 mg daily.  BMET today.  3. Atrial fibrillation: Paroxysmal.  She is in NSR today.  She had atrial fibrillation ablation in 6/18.  - Continue Toprol XL 25 mg daily.  - Continue apixaban - Tikosyn would be an option for arrhythmia control if needed in the future, but would be more difficult due to impaired renal function.  4. CKD: Stage III. BMET today.  5. Thyroid nodule: Needs repeat thyroid US to follow nodule in 3/20.  6. Depression/anxiety: Now off sertraline and doing ok.      Followup in 3 months  Loralie Champagne 07/21/2018

## 2018-07-24 ENCOUNTER — Other Ambulatory Visit (HOSPITAL_COMMUNITY): Payer: Self-pay | Admitting: Cardiology

## 2018-07-25 ENCOUNTER — Telehealth (HOSPITAL_COMMUNITY): Payer: Self-pay

## 2018-07-25 NOTE — Telephone Encounter (Signed)
Prior authorization through [employer] company was initiated for opsumit medication and sent via cmm on 07/25/2018.

## 2018-07-25 NOTE — Telephone Encounter (Signed)
Prior authorization through [employer] insurance company was APPROVED for opsumit and will expire on 08/03/2019.   

## 2018-08-11 ENCOUNTER — Telehealth (HOSPITAL_COMMUNITY): Payer: Self-pay

## 2018-08-11 NOTE — Telephone Encounter (Signed)
Prior authorization through [place] was initiated for Uptravi medication and sent via cmm on 08/11/2018.

## 2018-08-17 ENCOUNTER — Other Ambulatory Visit (HOSPITAL_COMMUNITY): Payer: Self-pay | Admitting: *Deleted

## 2018-08-17 ENCOUNTER — Telehealth (HOSPITAL_COMMUNITY): Payer: Self-pay

## 2018-08-17 MED ORDER — MACITENTAN 10 MG PO TABS: 10.0000 mg | ORAL_TABLET | Freq: Every day | ORAL | 11 refills | Status: DC

## 2018-08-17 NOTE — Telephone Encounter (Signed)
Prior authorization through [place] was initiated for Adempas medication and sent via [place] on 08/17/2018.

## 2018-08-18 ENCOUNTER — Telehealth (HOSPITAL_COMMUNITY): Payer: Self-pay

## 2018-08-18 NOTE — Telephone Encounter (Signed)
Prior authorization through [employer] was APPROVED for [place] and will expire on 08/03/2019.

## 2018-08-19 ENCOUNTER — Other Ambulatory Visit (HOSPITAL_COMMUNITY): Payer: Self-pay

## 2018-08-19 ENCOUNTER — Encounter (HOSPITAL_COMMUNITY): Payer: Self-pay | Admitting: *Deleted

## 2018-08-19 MED ORDER — MACITENTAN 10 MG PO TABS: 10.0000 mg | ORAL_TABLET | Freq: Every day | ORAL | 11 refills | Status: DC

## 2018-08-19 NOTE — Telephone Encounter (Signed)
Opsumit 10mg   # 30 with 11 refills called into [employer].  Pt insurance changed, no longer with [facility]. Pt aware of same. [employer] will call patient with delivery details

## 2018-08-19 NOTE — Telephone Encounter (Signed)
error 

## 2018-08-23 ENCOUNTER — Telehealth (HOSPITAL_COMMUNITY): Payer: Self-pay

## 2018-08-23 NOTE — Telephone Encounter (Signed)
Prior authorization through [place] was APPROVED for Tiffany [name] and will expire on 08/03/2019.

## 2018-08-26 ENCOUNTER — Other Ambulatory Visit (HOSPITAL_COMMUNITY): Payer: Self-pay | Admitting: Cardiology

## 2018-08-30 ENCOUNTER — Other Ambulatory Visit (HOSPITAL_COMMUNITY): Payer: Self-pay

## 2018-08-30 MED ORDER — SELEXIPAG 600 MCG PO TABS: 600.0000 ug | ORAL_TABLET | Freq: Two times a day (BID) | ORAL | 2 refills | Status: DC

## 2018-08-30 MED ORDER — RIOCIGUAT 2.5 MG PO TABS: 2.5000 mg | ORAL_TABLET | Freq: Three times a day (TID) | ORAL | 3 refills | Status: DC

## 2018-08-30 MED ORDER — MACITENTAN 10 MG PO TABS: 10.0000 mg | ORAL_TABLET | Freq: Every day | ORAL | 11 refills | Status: DC

## 2018-08-31 ENCOUNTER — Ambulatory Visit: Payer: Self-pay

## 2018-08-31 NOTE — Telephone Encounter (Signed)
fyi

## 2018-08-31 NOTE — Telephone Encounter (Signed)
Call placed to patient. I spoke with her husband.  He states that his wife has SOB as a normal condition  She is DX with pulmonary hypertension. She has recently gotten over a cold and has started to run a low grade fever (100F)  at night. Per husband Tiffany [name] is not SOB while sitting and resting, only when up and moving.  He states that she is able to speak in full sentences.  She has a mild occasional cough. She is hoarse. Per protocol pt should be seen today. Pt husband refused other providers stating that he feels his wife will be fine until tomorrow morning. He states she is getting ready for an eye doctor appointment today. He states that if anything becomes worse he will call back or seek immediate medical attention. Care advice read to husband. Husband verbalized understanding of all instructions.  Reason for Disposition . [1] MILD difficulty breathing (e.g., minimal/no SOB at rest, SOB with walking, pulse <100) AND [2] NEW-onset or WORSE than normal  Answer Assessment - Initial Assessment Questions 1. RESPIRATORY STATUS: "Describe your breathing?" (e.g., wheezing, shortness of breath, unable to speak, severe coughing)      SOB 2. ONSET: "When did this breathing problem begin?"      3 weeks ago after christmas 3. PATTERN "Does the difficult breathing come and go, or has it been constant since it started?"      Cold last week 4. SEVERITY: "How bad is your breathing?" (e.g., mild, moderate, severe)    - MILD: No SOB at rest, mild SOB with walking, speaks normally in sentences, can lay down, no retractions, pulse < 100.    - MODERATE: SOB at rest, SOB with minimal exertion and prefers to sit, cannot lie down flat, speaks in phrases, mild retractions, audible wheezing, pulse 100-120.    - SEVERE: Very SOB at rest, speaks in single words, struggling to breathe, sitting hunched forward, retractions, pulse > 120      Mild hoarse when speak in whole sentences 5. RECURRENT SYMPTOM: "Have you had  difficulty breathing before?" If so, ask: "When was the last time?" and "What happened that time?"      No 6. CARDIAC HISTORY: "Do you have any history of heart disease?" (e.g., heart attack, angina, bypass surgery, angioplasty)      Pulmonary hypertensiom 7. LUNG HISTORY: "Do you have any history of lung disease?"  (e.g., pulmonary embolus, asthma, emphysema)     no 8. CAUSE: "What do you think is causing the breathing problem?"     cold 9. OTHER SYMPTOMS: "Do you have any other symptoms? (e.g., dizziness, runny nose, cough, chest pain, fever)    Fever 100 every evening last 2-3 days mild cough 10. PREGNANCY: "Is there any chance you are pregnant?" "When was your last menstrual period?"       No 11. TRAVEL: "Have you traveled out of the country in the last month?" (e.g., travel history, exposures)     No  Protocols used: BREATHING DIFFICULTY-A-AH

## 2018-09-01 ENCOUNTER — Encounter: Payer: Self-pay | Admitting: Internal Medicine

## 2018-09-01 ENCOUNTER — Encounter: Payer: Self-pay | Admitting: Cardiovascular Disease

## 2018-09-01 ENCOUNTER — Ambulatory Visit (INDEPENDENT_AMBULATORY_CARE_PROVIDER_SITE_OTHER)
Admission: RE | Admit: 2018-09-01 | Discharge: 2018-09-01 | Disposition: A | Discharge status: Home / Self Care | Payer: Medicare PPO | Source: Ambulatory Visit | Attending: Internal Medicine | Admitting: Internal Medicine

## 2018-09-01 ENCOUNTER — Ambulatory Visit: Payer: Medicare PPO | Admitting: Internal Medicine

## 2018-09-01 ENCOUNTER — Other Ambulatory Visit (INDEPENDENT_AMBULATORY_CARE_PROVIDER_SITE_OTHER): Payer: Medicare PPO

## 2018-09-01 ENCOUNTER — Ambulatory Visit: Payer: Medicare PPO | Admitting: Cardiovascular Disease

## 2018-09-01 VITALS — BP 100/60 | HR 75 | Temp 98.3°F | Ht 60.0 in | Wt 146.0 lb

## 2018-09-01 VITALS — BP 106/70 | HR 79 | Ht 60.0 in | Wt 145.0 lb

## 2018-09-01 DIAGNOSIS — R0602 Shortness of breath: Secondary | ICD-10-CM

## 2018-09-01 DIAGNOSIS — I4891 Unspecified atrial fibrillation: Secondary | ICD-10-CM | POA: Diagnosis not present

## 2018-09-01 DIAGNOSIS — I480 Paroxysmal atrial fibrillation: Secondary | ICD-10-CM

## 2018-09-01 DIAGNOSIS — I5032 Chronic diastolic (congestive) heart failure: Secondary | ICD-10-CM

## 2018-09-01 DIAGNOSIS — I272 Pulmonary hypertension, unspecified: Secondary | ICD-10-CM | POA: Diagnosis not present

## 2018-09-01 DIAGNOSIS — I10 Essential (primary) hypertension: Secondary | ICD-10-CM | POA: Diagnosis not present

## 2018-09-01 LAB — COMPREHENSIVE METABOLIC PANEL
ALT: 11 U/L (ref 0–35)
AST: 15 U/L (ref 0–37)
Albumin: 4 g/dL (ref 3.5–5.2)
Alkaline Phosphatase: 55 U/L (ref 39–117)
BUN: 31 mg/dL — ABNORMAL HIGH (ref 6–23)
CO2: 26 mEq/L (ref 19–32)
Calcium: 10.6 mg/dL — ABNORMAL HIGH (ref 8.4–10.5)
Chloride: 101 mEq/L (ref 96–112)
Creatinine, Ser: 1.97 mg/dL — ABNORMAL HIGH (ref 0.40–1.20)
GFR: 24.76 mL/min — ABNORMAL LOW (ref >60.00)
Glucose, Bld: 99 mg/dL (ref 70–99)
Potassium: 3.8 mEq/L (ref 3.5–5.1)
Sodium: 138 mEq/L (ref 135–145)
Total Bilirubin: 0.5 mg/dL (ref 0.2–1.2)
Total Protein: 7.6 g/dL (ref 6.0–8.3)

## 2018-09-01 LAB — CBC
HCT: 36.4 % (ref 36.0–46.0)
Hemoglobin: 12.2 g/dL (ref 12.0–15.0)
MCHC: 33.5 g/dL (ref 30.0–36.0)
MCV: 85.6 fl (ref 78.0–100.0)
Platelets: 288 10*3/uL (ref 150.0–400.0)
RBC: 4.25 Mil/uL (ref 3.87–5.11)
RDW: 15.6 % — ABNORMAL HIGH (ref 11.5–15.5)
WBC: 6.7 10*3/uL (ref 4.0–10.5)

## 2018-09-01 LAB — BRAIN NATRIURETIC PEPTIDE: Pro B Natriuretic peptide (BNP): 108 pg/mL — ABNORMAL HIGH (ref 0.0–100.0)

## 2018-09-01 LAB — POCT EXHALED NITRIC OXIDE: FeNO level (ppb): 20

## 2018-09-01 NOTE — Progress Notes (Signed)
   Subjective:   Patient ID: Tiffany [name], female    DOB: 21-Apr-1946, 73 y.o.   MRN: [id number]  HPI The patient is a 73 YO female coming in for SOB and fatigue for the last 3-4 weeks. Also is having some intermittent low grade fevers as well as poor appetite. She denies flu like symptoms. Denies headache. Some nasal drainage at some point but not much now. Thought it was sinus related and went to see her ENT and they said that she was clear and no sinus problems. She is coughing but minimal. She is getting SOB more with activity and walking. She denies chest pains. Has intermittent A fib and not different recently. Maybe 4 hour episode of A fib once a week or so. Has tried flonase which did not help much. Has not tried anything else. She has been monitoring weights and they are decreasing due to poor appetite. Swelling is better than usual. Denies missing lasix dosing. She does also have pulmonary hypertension and echo and EKG in mid December without significant changes. She tried calling her cardiologist on Monday per ENT recommendation but has not received a call back from them.   Manual review by this MD independent CXR done 09/01/2018 reveals no significant change from prior with continued coarse markings diffuse. No evidence for pneumonia or fluid overload  Review of Systems  Constitutional: Positive for activity change, appetite change, fatigue and fever.  HENT: Negative.   Eyes: Negative.   Respiratory: Positive for shortness of breath. Negative for cough and chest tightness.   Cardiovascular: Negative for chest pain, palpitations and leg swelling.  Gastrointestinal: Negative for abdominal distention, abdominal pain, constipation, diarrhea, nausea and vomiting.  Musculoskeletal: Negative.   Skin: Negative.   Neurological: Negative.   Psychiatric/Behavioral: Negative.     Objective:  Physical Exam Constitutional:      Appearance: She is well-developed.  HENT:     Head: Normocephalic  and atraumatic.  Neck:     Musculoskeletal: Normal range of motion.  Cardiovascular:     Rate and Rhythm: Normal rate and regular rhythm.  Pulmonary:     Effort: Pulmonary effort is normal. No respiratory distress.     Breath sounds: Normal breath sounds. No wheezing or rales.  Abdominal:     General: Bowel sounds are normal. There is no distension.     Palpations: Abdomen is soft.     Tenderness: There is no abdominal tenderness. There is no rebound.  Musculoskeletal:     Comments: Mild edema which is stable to improved from prior  Skin:    General: Skin is warm and dry.  Neurological:     Mental Status: She is alert and oriented to person, place, and time.     Coordination: Coordination normal.     Vitals:   09/01/18 1002  BP: 100/60  Pulse: 75  Temp: 98.3 F (36.8 C)  TempSrc: Oral  SpO2: 99%  Weight: 146 lb (66.2 kg)  Height: 5' (1.524 m)   FENO: 20  Assessment & Plan:

## 2018-09-01 NOTE — Patient Instructions (Signed)
We are checking the chest x-ray today and the labs and will have an answer today about that.

## 2018-09-01 NOTE — Progress Notes (Signed)
Cardiology Office Note    Date:  09/03/2018   ID:  Tiffany [name], DOB [birth year], MRN [id number]  PCP:  [name], MD  Cardiologist:  Shelva Majestic, MD (sleep); Dr. [name]  F/U sleep evaluation  History of Present Illness:  Tiffany [name] is a 73 y.o. female who presents for one-year follow-up sleep evaluation.   She is followed by Dr. [name].  Tiffany [name] has a history of paroxysmal atrial fibrillation and diastolic heart failure and was found to have pulmonary hypertension.  She admits to dyspnea.  She had developed an episode of atrial fibrillation and underwent cardioversion in June 2016.  She was started on amiodarone but this was stopped secondary to dyspnea.  A nuclear perfusion study in September 2016 did not reveal ischemia or infarction.  She was found to have elevated left and right heart filling pressures as well as pulmonary hypertension at catheterization and her PA pressure was increased up to 85 mm on echo.  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  Because of her pulmonary hypertension, she was referred for a sleep study which was done on 04/10/2016.  She met split-night criteria and was found to have severe obstructive sleep apnea with an HIF 59.4 per hour on the diagnostic study.  She did not have evidence for central apneic events.  Oxygen desaturated to a nadir of 80%.  There was moderate snoring.  She underwent CPAP titration and a 13 cm water pressure was recommended.   CPAP was initiated on 05/18/2016.  When I saw her for initial evaluation, a download obtained from 06/13/2016 through 07/12/2016 demonstrated 100% compliance abuse and usage greater than 4 hours. She was averaging 9 hours and 13 minutes of sleep per night.  At 13 cmwater pressure, AHI was  2.5.   Over the past year, she has continued to use CPAP with 1% compliance.  A new download was obtained from 06/21/2017 through 07/20/2017.  This confirms 100% compliance.   She is averaging 9 hours and 26 minutes of sleep per night.  At her 13 cm water pressure, AHI is excellent at 2.7.  There is no significant leak.  She is sleeping well.  She denies any daytime sleepiness.  She is unaware of any breakthrough snoring.  In the office today.  I recalculated an Epworth Sleepiness Scale score, and this was excellent and endorsed at 1.   She saw Dr. [name] advanced heart failure clinic in November 2018.  She has a history of atrial fibrillation and pulmonary hypertension with diastolic heart failure.  She had undergone a right heart catheterization.  Her pulmonary pressures have significant improved with CPAP treatment.   I last saw her in December 2018 at which time she was doing well from a sleep perspective.  Compliance was excellent.  AHI was excellent at 2.7.  Over the past year, she has done well from a cardiac perspective.  An echo Doppler study in December 2019 showed an EF of 65 to 70% with moderate diastolic dysfunction, mild left ear, mild MR, PASP 47 mm and normal RV.  A new download was obtained in the office today from August 02, 2018 through August 31, 2018.  Compliance remains excellent at 100%.  She is averaging 9 hours and 7 minutes of CPAP use per night.  At a set pressure of 13 cm, AHI is outstanding at 0.9.  There is no leak.  Unaware of any breakthrough snoring.  Her sleep is restorative.  She denies residual daytime sleepiness.  Admits to episodic palpitations every 5 to 7 days with possible brief recurrent AF.  She is scheduled to see Roderic Palau in 2 weeks.   Past Medical History:  Diagnosis Date  . Arthritis 04-20-12   Osteoarthritis-right hip  . Atrial fibrillation status post cardioversion, 01/30/15 maintaining SR.  01/31/2015   a. 01/2015 s/p TEE/DCCV;  b. 02/06/2015 recurrent AF noted, amio added;  c. CHA2DS2VASc = 3-->eliquis.  Marland Kitchen BENIGN POSITIONAL VERTIGO   . Cataract   . Complication of anesthesia 04-20-12   some issues with prolonged  sedation after anesthesia  . Diverticulosis   . DYSLIPIDEMIA   . Esophageal stricture   . GERD   . HYPERTENSION    a. severe, pt intol of med tx, Pt. has severe "whitecoat" syndrome and refused medical therapy.  . Hypothyroidism 09/29/2014   a. pt did not tolerate synthroid and this was subsequently discontinued.  . Mild LV dysfunction    a. 01/2015 Echo: EF 45-50%, mild MR, mildly dil LA, mod dil RV with mod to sev reduced fxn, mod-sev TR, PASP 20mHg.  . Osteopenia   . Raynaud disease   . URINARY INCONTINENCE 04-20-12   occ. with nighttime sleep pattern    Past Surgical History:  Procedure Laterality Date  . ATRIAL FIBRILLATION ABLATION N/A 01/26/2017   Procedure: Atrial Fibrillation Ablation;  Surgeon: AThompson Grayer MD;  [facility];  Service: Cardiovascular;  Laterality: N/A;  . breast ultrasound Right 09/13/13   There is no sonographic evidence of malignancy. the 05.1OAcomplicated cyst in (R) breast is consistent with a benign finding. repeat in 1 year  . CARDIAC CATHETERIZATION N/A 08/29/2015   Procedure: Right Heart Cath;  Surgeon: DLarey Dresser MD;  [facility];  Service: Cardiovascular;  Laterality: N/A;  . CARDIOVERSION N/A 01/30/2015   Procedure: CARDIOVERSION;  Surgeon: MSanda Klein MD;  [facility];  Service: Cardiovascular;  Laterality: N/A;  . CHOLECYSTECTOMY     '90-"sludge"  . NASAL FRACTURE SURGERY  2006  . RIGHT HEART CATH N/A 06/30/2017   Procedure: RIGHT HEART CATH;  Surgeon: MLarey Dresser MD;  [facility];  Service: Cardiovascular;  Laterality: N/A;  . TEE WITHOUT CARDIOVERSION N/A 01/30/2015   Procedure: TRANSESOPHAGEAL ECHOCARDIOGRAM (TEE);  Surgeon: MSanda Klein MD;  [facility];  Service: Cardiovascular;  Laterality: N/A;  . TEE WITHOUT CARDIOVERSION N/A 01/25/2017   Procedure: TRANSESOPHAGEAL ECHOCARDIOGRAM (TEE);  Surgeon: RFay Records MD;  [facility];  Service:  Cardiovascular;  Laterality: N/A;  . TOTAL HIP ARTHROPLASTY  04/26/2012   Procedure: TOTAL HIP ARTHROPLASTY ANTERIOR APPROACH;  Surgeon: MMauri Pole MD;  [facility];  Service: Orthopedics;  Laterality: Right;  . TUBAL LIGATION  1980    Current Medications: Outpatient Medications Prior to Visit  Medication Sig Dispense Refill  . acetaminophen (TYLENOL) 325 MG tablet Take 2 tablets (650 mg total) by mouth every 4 (four) hours as needed for headache or mild pain.    .Marland KitchenELIQUIS 5 MG TABS tablet TAKE 1 TABLET BY MOUTH TWICE A DAY 180 tablet 1  . Estrogens, Conjugated (PREMARIN VA) Place 1 application vaginally once a week.    . famotidine (PEPCID) 20 MG tablet TAKE 1 TABLET BY MOUTH TWICE A DAY 180 tablet 3  . fluticasone (FLONASE) 50 MCG/ACT nasal spray Place 1 spray daily as needed into both nostrils for allergies or rhinitis.    .Marland Kitchen  furosemide (LASIX) 40 MG tablet Take 2 tablets (80 mg total) by mouth daily. Can take 1 extra tab in PM for wt gain 30 tablet   . macitentan (OPSUMIT) 10 MG tablet Take 1 tablet (10 mg total) by mouth daily. 30 tablet 11  . metoprolol succinate (TOPROL-XL) 25 MG 24 hr tablet Take 1 tablet (25 mg total) by mouth daily. 90 tablet 3  . potassium chloride SA (KLOR-CON M20) 20 MEQ tablet Take 2 tablets (40 mEq total) by mouth daily. 180 tablet 3  . Probiotic Product (PROBIOTIC-10) CAPS Take 1 capsule by mouth 3 (three) times a week.     . Riociguat (ADEMPAS) 2.5 MG TABS Take 2.5 mg by mouth 3 (three) times daily. 90 tablet 3  . Selexipag (UPTRAVI) 600 MCG TABS Take 600 mcg by mouth 2 (two) times daily. 180 tablet 2   No facility-administered medications prior to visit.      Allergies:   Levothyroxine; Statins; Prednisone; and Penicillins   Social History   Socioeconomic History  . Marital status: Married    Spouse name: Not on file  . Number of children: 2  . Years of education: Not on file  . Highest education level: Not on file  Occupational History    . Not on file  Social Needs  . Financial resource strain: Not on file  . Food insecurity:    Worry: Not on file    Inability: Not on file  . Transportation needs:    Medical: Not on file    Non-medical: Not on file  Tobacco Use  . Smoking status: Never Smoker  . Smokeless tobacco: Never Used  Substance and Sexual Activity  . Alcohol use: Yes    Alcohol/week: 2.0 standard drinks    Types: 2 Glasses of wine per week    Comment: several glasses wine weekly  . Drug use: No  . Sexual activity: Yes  Lifestyle  . Physical activity:    Days per week: Not on file    Minutes per session: Not on file  . Stress: Not on file  Relationships  . Social connections:    Talks on phone: Not on file    Gets together: Not on file    Attends religious service: Not on file    Active member of club or organization: Not on file    Attends meetings of clubs or organizations: Not on file    Relationship status: Not on file  Other Topics Concern  . Not on file  Social History Narrative   She enjoys birding.  Lives at home with husband.   Married.   retired Quarry manager, Tax inspector     Family History:  The patient's family history includes Other in an other family member. She was adopted.   ROS General: Negative; No fevers, chills, or night sweats;  HEENT: Negative; No changes in vision or hearing, sinus congestion, difficulty swallowing Pulmonary: Negative; No cough, wheezing, shortness of breath, hemoptysis Cardiovascular: History of diastolic heart failure, pulmonary hypertension GI: Negative; No nausea, vomiting, diarrhea, or abdominal pain GU: Negative; No dysuria, hematuria, or difficulty voiding Musculoskeletal: Negative; no myalgias, joint pain, or weakness Hematologic/Oncology: Negative; no easy bruising, bleeding Endocrine: Negative; no heat/cold intolerance; no diabetes Neuro: Negative; no changes in balance, headaches Skin: Negative; No rashes or skin  lesions Psychiatric: Negative; No behavioral problems, depression Sleep: Positive for severe sleep apnea.  Since initiating CPAP therapy there is no residual snoring, daytime sleepiness, hypersomnolence, bruxism,  restless legs, hypnogognic hallucinations, no cataplexy Other comprehensive 14 point system review is negative.   PHYSICAL EXAM:   VS:  BP 106/70 (BP Location: Left Arm, Patient Position: Sitting, Cuff Size: Normal)   Pulse 79   Ht 5' (1.524 m)   Wt 145 lb (65.8 kg)   BMI 28.32 kg/m     Repeat blood pressure by me was 114/70.  Wt Readings from Last 3 Encounters:  09/01/18 145 lb (65.8 kg)  09/01/18 146 lb (66.2 kg)  07/20/18 149 lb 9.6 oz (67.9 kg)    General: Alert, oriented, no distress.  Skin: normal turgor, no rashes, warm and dry HEENT: Normocephalic, atraumatic. Pupils equal round and reactive to light; sclera anicteric; extraocular muscles intact;  Nose without nasal septal hypertrophy Mouth/Parynx benign; Mallinpatti scale 3 Neck: No JVD, no carotid bruits; normal carotid upstroke Lungs: clear to ausculatation and percussion; no wheezing or rales Chest wall: without tenderness to palpitation Heart: PMI not displaced, RRR, s1 s2 normal, 1/6 systolic murmur, no diastolic murmur, no rubs, gallops, thrills, or heaves Abdomen: soft, nontender; no hepatosplenomehaly, BS+; abdominal aorta nontender and not dilated by palpation. Back: no CVA tenderness Pulses 2+ Musculoskeletal: full range of motion, normal strength, no joint deformities Extremities: no clubbing cyanosis or edema, Homan's sign negative  Neurologic: grossly nonfocal; Cranial nerves grossly wnl Psychologic: Normal mood and affect   Studies/Labs Reviewed:   ECG (independently read by me): Sinus rhythm at 79 bpm with frequent PACs, possible left atrial enlargement.  QS V1 V2   An independent review of her last ECG from 08/08/2015 reveals normal sinus rhythm at 70 bpm.  There is left axis deviation and  incomplete right bundle branch block.  Recent Labs: BMP Latest Ref Rng & Units 09/01/2018 07/20/2018 04/18/2018  Glucose 70 - 99 mg/dL 99 93 82  BUN 6 - 23 mg/dL 31(H) 29(H) 29(H)  Creatinine 0.40 - 1.20 mg/dL 1.97(H) 2.07(H) 1.90(H)  BUN/Creat Ratio 12 - 28 - - -  Sodium 135 - 145 mEq/L 138 139 141  Potassium 3.5 - 5.1 mEq/L 3.8 4.0 3.5  Chloride 96 - 112 mEq/L 101 104 107  CO2 19 - 32 mEq/L _0 Calcium 8.4 - 10.5 mg/dL 10.6(H) 9.5 9.2     Hepatic Function Latest Ref Rng & Units 09/01/2018 10/08/2017 12/09/2016  Total Protein 6.0 - 8.3 g/dL 7.6 7.2 7.1  Albumin 3.5 - 5.2 g/dL 4.0 4.0 3.9  AST 0 - 37 U/L _1 ALT 0 - 35 U/L 11 11 12(L)  Alk Phosphatase 39 - 117 U/L 55 59 86  Total Bilirubin 0.2 - 1.2 mg/dL 0.5 0.6 0.8  Bilirubin, Direct 0.0 - 0.3 mg/dL - - -    CBC Latest Ref Rng & Units 09/01/2018 07/20/2018 10/08/2017  WBC 4.0 - 10.5 K/uL 6.7 5.3 4.8  Hemoglobin 12.0 - 15.0 g/dL 12.2 12.8 13.1  Hematocrit 36.0 - 46.0 % 36.4 40.7 38.3  Platelets 150.0 - 400.0 K/uL 288.0 206 185.0   Lab Results  Component Value Date   MCV 85.6 09/01/2018   MCV 92.7 07/20/2018   MCV 87.4 10/08/2017   Lab Results  Component Value Date   TSH 2.66 10/08/2017   Lab Results  Component Value Date   HGBA1C 5.2 10/08/2017     BNP    Component Value Date/Time   BNP 179.1 (H) 07/20/2018 1140    ProBNP    Component Value Date/Time   PROBNP 108.0 (H) 09/01/2018 1100  Lipid Panel     Component Value Date/Time   CHOL 212 (H) 10/08/2017 1104   TRIG 166.0 (H) 10/08/2017 1104   TRIG 185 02/11/2009   HDL 55.90 10/08/2017 1104   CHOLHDL 4 10/08/2017 1104   VLDL 33.2 10/08/2017 1104   LDLCALC 123 (H) 10/08/2017 1104     RADIOLOGY: Dg Chest 2 View  Result Date: 09/01/2018 CLINICAL DATA:  Shortness of breath and fatigue. History of heart failure and pulmonary hypertension. EXAM: CHEST - 2 VIEW COMPARISON:  06/15/2017 FINDINGS: Stable top-normal heart size. Diffuse pulmonary  interstitial prominence again noted, potentially mildly more prominent. Findings are felt to most likely relate to chronic interstitial edema. No overt airspace edema or pleural fluid identified. No airspace consolidation or pneumothorax. Bony structures are unremarkable. IMPRESSION: Diffuse pulmonary interstitial prominence, potentially slightly more prominent compared to the prior chest x-ray. Findings are felt to most likely relate to chronic interstitial pulmonary edema. Electronically Signed   By: Aletta Edouard M.D.   On: 09/01/2018 15:51     Additional studies/ records that were reviewed today include:  I review the office records of Dr. [name], the patient's sleep study, ECG, and obtained a new download  download.    ASSESSMENT:    1. Essential hypertension   2. Atrial fibrillation status post cardioversion, 01/30/15 maintaining SR.    3. Paroxysmal atrial fibrillation ([place])   4. Pulmonary HTN ([place])   5. Chronic diastolic (congestive) heart failure [facility])     PLAN:  Tiffany [name] is a very pleasant 73 year old female who has a history of paroxysmal atrial fibrillation and diastolic heart failure and was found to have significant pulmonary hypertension.  Her initial sleep study revealed demonstrated severe sleep apnea with an AHI of 59.4/h and significant oxygen desaturation to a nadir of 80%.  On her initial study, she had reduced sleep efficiency and moderate snoring.  She has been on CPAP therapy at 13 cm and is 100% compliance with reference to usage stays as well as usage greater than 4 hours.  Most recent download is consistent with prior downloads and shows 100% compliance.  She is followed closely by Dr. Algernon Huxley.  She has also seen Dr. [name] and will be seeing Roderic Palau with reference to her PAF.  She has noticed some episodes of recurrent AF perhaps weekly which may last for several hours at a time.  She has been taking Toprol 25 mg.  Her resting pulse today is sinus in the  70s.  I suspect her dose of Toprol may need to be increased or alternatively she may be given a prescription for as needed diltiazem and she sees Roderic Palau.  I will defer changing her medicines presently until their assessment.  She continues to do well from a sleep perspective and per Medicare guidelines I will need to see her on an annual basis.   Medication Adjustments/Labs and Tests Ordered: Current medicines are reviewed at length with the patient today.  Concerns regarding medicines are outlined above.  Medication changes, Labs and Tests ordered today are listed in the Patient Instructions below.  Patient Instructions  Medication Instructions:  The current medical regimen is effective;  continue present plan and medications.  If you need a refill on your cardiac medications before your next appointment, please call your pharmacy.    Follow-Up: At [facility], you and your health needs are our priority.  As part of our continuing mission to provide you with exceptional heart care, we have created  designated Provider Care Teams.  These Care Teams include your primary Cardiologist (physician) and Advanced Practice Providers (APPs -  Physician Assistants and Nurse Practitioners) who all work together to provide you with the care you need, when you need it. You will need a follow up appointment in 12 months.  Please call our office 2 months in advance to schedule this appointment.  You may see Dr.Samson Ralph or one of the following Advanced Practice Providers on your designated Care Team: Almyra Deforest, [place] . Fabian Sharp, PA-C      Time spent: 30 minutes Signed, Shelva Majestic, MD  09/03/2018 3:21 PM    [facility] [place] [address], [place], [place], [place]  [address] Phone: [phone]       Cardiology Office Note    Date:  09/03/2018   ID:  Tiffany [name], Tiffany [name] 08-14-1945, MRN [id number]  PCP:  [name], MD  Cardiologist:  Shelva Majestic, MD (sleep);  Dr. [name]  New sleep evaluation  History of Present Illness:  Tiffany [name] is a 73 y.o. female who presents for follow-up sleep evaluation.  I last saw her in December 2018.   Tiffany [name] has a history of paroxysmal atrial fibrillation and diastolic heart failure and was found to have pulmonary hypertension.  She admits to dyspnea.  She had developed an episode of atrial fibrillation and underwent cardioversion in June 2016.  She was started on amiodarone but this was stopped secondary to dyspnea.  A nuclear perfusion study in September 2016 did not reveal ischemia or infarction.  She was found to have elevated left and right heart filling pressures as well as pulmonary hypertension at catheterization and her PA pressure was increased up to 85 mm on echo.  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  Because of her pulmonary hypertension, she was referred for a sleep study which was done on 04/10/2016.  She met split-night criteria and was found to have severe obstructive sleep apnea with an HIF 59.4 per hour on the diagnostic study.  She did not have evidence for central apneic events.  Oxygen desaturated to a nadir of 80%.  There was moderate snoring.  She underwent CPAP titration and a 13 cm water pressure was recommended.  Her CPAP was set up following her CPAP titration.  A download obtained from 06/13/2016 through 07/12/2016 shows 100% compliance abuse and usage greater than 4 hours.  She is averaging 9 hours and 13 minutes of sleep per night.  At 13.  Senna meter water pressure, AHI is 2.5.  She is sleeping well.  She is unaware of breakthrough snoring.  She denies frequent awakenings.  There is no daytime sleepiness.  In a form sleepiness scale was calculated and endorsed at 2 shown below.  Epworth Sleepiness Scale: Situation   Chance of Dozing/Sleeping (0 = never , 1 = slight chance , 2 = moderate chance , 3 = high chance )   sitting and reading 1   watching  TV 0   sitting inactive in a public place 0   being a passenger in a motor vehicle for an hour or more 0   lying down in the afternoon 1   sitting and talking to someone 0   sitting quietly after lunch (no alcohol) 0   while stopped for a few minutes in traffic as the driver 0   Total Score  2      Past  Medical History:  Diagnosis Date  . Arthritis 04-20-12   Osteoarthritis-right hip  . Atrial fibrillation status post cardioversion, 01/30/15 maintaining SR.  01/31/2015   a. 01/2015 s/p TEE/DCCV;  b. 02/06/2015 recurrent AF noted, amio added;  c. CHA2DS2VASc = 3-->eliquis.  Marland Kitchen BENIGN POSITIONAL VERTIGO   . Cataract   . Complication of anesthesia 04-20-12   some issues with prolonged sedation after anesthesia  . Diverticulosis   . DYSLIPIDEMIA   . Esophageal stricture   . GERD   . HYPERTENSION    a. severe, pt intol of med tx, Pt. has severe "whitecoat" syndrome and refused medical therapy.  . Hypothyroidism 09/29/2014   a. pt did not tolerate synthroid and this was subsequently discontinued.  . Mild LV dysfunction    a. 01/2015 Echo: EF 45-50%, mild MR, mildly dil LA, mod dil RV with mod to sev reduced fxn, mod-sev TR, PASP 31mHg.  . Osteopenia   . Raynaud disease   . URINARY INCONTINENCE 04-20-12   occ. with nighttime sleep pattern    Past Surgical History:  Procedure Laterality Date  . ATRIAL FIBRILLATION ABLATION N/A 01/26/2017   Procedure: Atrial Fibrillation Ablation;  Surgeon: AThompson Grayer MD;  [facility];  Service: Cardiovascular;  Laterality: N/A;  . breast ultrasound Right 09/13/13   There is no sonographic evidence of malignancy. the 08.5OYcomplicated cyst in (R) breast is consistent with a benign finding. repeat in 1 year  . CARDIAC CATHETERIZATION N/A 08/29/2015   Procedure: Right Heart Cath;  Surgeon: DLarey Dresser MD;  [facility];  Service: Cardiovascular;  Laterality: N/A;  . CARDIOVERSION N/A 01/30/2015   Procedure:  CARDIOVERSION;  Surgeon: MSanda Klein MD;  [facility];  Service: Cardiovascular;  Laterality: N/A;  . CHOLECYSTECTOMY     '90-"sludge"  . NASAL FRACTURE SURGERY  2006  . RIGHT HEART CATH N/A 06/30/2017   Procedure: RIGHT HEART CATH;  Surgeon: MLarey Dresser MD;  [facility];  Service: Cardiovascular;  Laterality: N/A;  . TEE WITHOUT CARDIOVERSION N/A 01/30/2015   Procedure: TRANSESOPHAGEAL ECHOCARDIOGRAM (TEE);  Surgeon: MSanda Klein MD;  [facility];  Service: Cardiovascular;  Laterality: N/A;  . TEE WITHOUT CARDIOVERSION N/A 01/25/2017   Procedure: TRANSESOPHAGEAL ECHOCARDIOGRAM (TEE);  Surgeon: RFay Records MD;  [facility];  Service: Cardiovascular;  Laterality: N/A;  . TOTAL HIP ARTHROPLASTY  04/26/2012   Procedure: TOTAL HIP ARTHROPLASTY ANTERIOR APPROACH;  Surgeon: MMauri Pole MD;  [facility];  Service: Orthopedics;  Laterality: Right;  . TUBAL LIGATION  1980    Current Medications: Outpatient Medications Prior to Visit  Medication Sig Dispense Refill  . acetaminophen (TYLENOL) 325 MG tablet Take 2 tablets (650 mg total) by mouth every 4 (four) hours as needed for headache or mild pain.    .Marland KitchenELIQUIS 5 MG TABS tablet TAKE 1 TABLET BY MOUTH TWICE A DAY 180 tablet 1  . Estrogens, Conjugated (PREMARIN VA) Place 1 application vaginally once a week.    . famotidine (PEPCID) 20 MG tablet TAKE 1 TABLET BY MOUTH TWICE A DAY 180 tablet 3  . fluticasone (FLONASE) 50 MCG/ACT nasal spray Place 1 spray daily as needed into both nostrils for allergies or rhinitis.    . furosemide (LASIX) 40 MG tablet Take 2 tablets (80 mg total) by mouth daily. Can take 1 extra tab in PM for wt gain 30 tablet   . macitentan (OPSUMIT) 10 MG tablet Take 1 tablet (  10 mg total) by mouth daily. 30 tablet 11  . metoprolol succinate (TOPROL-XL) 25 MG 24 hr tablet Take 1 tablet (25 mg total) by mouth daily. 90 tablet 3  . potassium chloride SA (KLOR-CON  M20) 20 MEQ tablet Take 2 tablets (40 mEq total) by mouth daily. 180 tablet 3  . Probiotic Product (PROBIOTIC-10) CAPS Take 1 capsule by mouth 3 (three) times a week.     . Riociguat (ADEMPAS) 2.5 MG TABS Take 2.5 mg by mouth 3 (three) times daily. 90 tablet 3  . Selexipag (UPTRAVI) 600 MCG TABS Take 600 mcg by mouth 2 (two) times daily. 180 tablet 2   No facility-administered medications prior to visit.      Allergies:   Levothyroxine; Statins; Prednisone; and Penicillins   Social History   Socioeconomic History  . Marital status: Married    Spouse name: Not on file  . Number of children: 2  . Years of education: Not on file  . Highest education level: Not on file  Occupational History  . Not on file  Social Needs  . Financial resource strain: Not on file  . Food insecurity:    Worry: Not on file    Inability: Not on file  . Transportation needs:    Medical: Not on file    Non-medical: Not on file  Tobacco Use  . Smoking status: Never Smoker  . Smokeless tobacco: Never Used  Substance and Sexual Activity  . Alcohol use: Yes    Alcohol/week: 2.0 standard drinks    Types: 2 Glasses of wine per week    Comment: several glasses wine weekly  . Drug use: No  . Sexual activity: Yes  Lifestyle  . Physical activity:    Days per week: Not on file    Minutes per session: Not on file  . Stress: Not on file  Relationships  . Social connections:    Talks on phone: Not on file    Gets together: Not on file    Attends religious service: Not on file    Active member of club or organization: Not on file    Attends meetings of clubs or organizations: Not on file    Relationship status: Not on file  Other Topics Concern  . Not on file  Social History Narrative   She enjoys birding.  Lives at home with husband.   Married.   retired Quarry manager, Tax inspector     Family History:  The patient's family history includes Other in an other family member. She was  adopted.   ROS General: Negative; No fevers, chills, or night sweats;  HEENT: Negative; No changes in vision or hearing, sinus congestion, difficulty swallowing Pulmonary: Negative; No cough, wheezing, shortness of breath, hemoptysis Cardiovascular: Negative; No chest pain, presyncope, syncope, palpitations GI: Negative; No nausea, vomiting, diarrhea, or abdominal pain GU: Negative; No dysuria, hematuria, or difficulty voiding Musculoskeletal: Negative; no myalgias, joint pain, or weakness Hematologic/Oncology: Negative; no easy bruising, bleeding Endocrine: Negative; no heat/cold intolerance; no diabetes Neuro: Negative; no changes in balance, headaches Skin: Negative; No rashes or skin lesions Psychiatric: Negative; No behavioral problems, depression Sleep: Positive for severe sleep apnea.  Since initiating CPAP therapy there is no residual snoring, daytime sleepiness, hypersomnolence, bruxism, restless legs, hypnogognic hallucinations, no cataplexy Other comprehensive 14 point system review is negative.   PHYSICAL EXAM:   VS:  BP 106/70 (BP Location: Left Arm, Patient Position: Sitting, Cuff Size: Normal)   Pulse 79  Ht 5' (1.524 m)   Wt 145 lb (65.8 kg)   BMI 28.32 kg/m    Wt Readings from Last 3 Encounters:  09/01/18 145 lb (65.8 kg)  09/01/18 146 lb (66.2 kg)  07/20/18 149 lb 9.6 oz (67.9 kg)      Physical Exam BP 106/70 (BP Location: Left Arm, Patient Position: Sitting, Cuff Size: Normal)   Pulse 79   Ht 5' (1.524 m)   Wt 145 lb (65.8 kg)   BMI 28.32 kg/m  General: Alert, oriented, no distress.  Skin: normal turgor, no rashes, warm and dry HEENT: Normocephalic, atraumatic. Pupils equal round and reactive to light; sclera anicteric; extraocular muscles intact; Fundi ** Nose without nasal septal hypertrophy Mouth/Parynx benign; Mallinpatti scale Neck: No JVD, no carotid bruits; normal carotid upstroke Lungs: clear to ausculatation and percussion; no wheezing or  rales Chest wall: without tenderness to palpitation Heart: PMI not displaced, RRR, s1 s2 normal, 1/6 systolic murmur, no diastolic murmur, no rubs, gallops, thrills, or heaves Abdomen: soft, nontender; no hepatosplenomehaly, BS+; abdominal aorta nontender and not dilated by palpation. Back: no CVA tenderness Pulses 2+ Musculoskeletal: full range of motion, normal strength, no joint deformities Extremities: no clubbing cyanosis or edema, Homan's sign negative  Neurologic: grossly nonfocal; Cranial nerves grossly wnl Psychologic: Normal mood and affect    General: Alert, oriented, no distress.  Skin: normal turgor, no rashes, warm and dry HEENT: Normocephalic, atraumatic. Pupils equal round and reactive to light; sclera anicteric; extraocular muscles intact; Fundi Without hemorrhages or exudates. Nose without nasal septal hypertrophy Mouth/Parynx benign; Mallinpatti scale 3 Neck: No JVD, no carotid bruits; normal carotid upstroke Lungs: clear to ausculatation and percussion; no wheezing or rales Chest wall: without tenderness to palpitation Heart: PMI not displaced, RRR, s1 s2 normal, 1/6 systolic murmur, no diastolic murmur, no rubs, gallops, thrills, or heaves Abdomen: soft, nontender; no hepatosplenomehaly, BS+; abdominal aorta nontender and not dilated by palpation. Back: no CVA tenderness Pulses 2+ Musculoskeletal: full range of motion, normal strength, no joint deformities Extremities: no clubbing cyanosis or edema, Homan's sign negative  Neurologic: grossly nonfocal; Cranial nerves grossly wnl Psychologic: Normal mood and affect   Studies/Labs Reviewed:   ECG (independently read by me): Normal sinus rhythm at 79 bpm with frequent PACs.  [place] complex V1 V2.  December 2018 ECG (independently read by me):  Sinus rhythm with PACs.  Poor progression V1 and V2.  Normal intervals.   An independent review of her last ECG from 08/08/2015 reveals normal sinus rhythm at 70 bpm.  There is  left axis deviation and incomplete right bundle branch block.  Recent Labs: BMP Latest Ref Rng & Units 09/01/2018 07/20/2018 04/18/2018  Glucose 70 - 99 mg/dL 99 93 82  BUN 6 - 23 mg/dL 31(H) 29(H) 29(H)  Creatinine 0.40 - 1.20 mg/dL 1.97(H) 2.07(H) 1.90(H)  BUN/Creat Ratio 12 - 28 - - -  Sodium 135 - 145 mEq/L 138 139 141  Potassium 3.5 - 5.1 mEq/L 3.8 4.0 3.5  Chloride 96 - 112 mEq/L 101 104 107  CO2 19 - 32 mEq/L _0 Calcium 8.4 - 10.5 mg/dL 10.6(H) 9.5 9.2     Hepatic Function Latest Ref Rng & Units 09/01/2018 10/08/2017 12/09/2016  Total Protein 6.0 - 8.3 g/dL 7.6 7.2 7.1  Albumin 3.5 - 5.2 g/dL 4.0 4.0 3.9  AST 0 - 37 U/L _1 ALT 0 - 35 U/L 11 11 12(L)  Alk Phosphatase 39 - 117 U/L 55 59  86  Total Bilirubin 0.2 - 1.2 mg/dL 0.5 0.6 0.8  Bilirubin, Direct 0.0 - 0.3 mg/dL - - -    CBC Latest Ref Rng & Units 09/01/2018 07/20/2018 10/08/2017  WBC 4.0 - 10.5 K/uL 6.7 5.3 4.8  Hemoglobin 12.0 - 15.0 g/dL 12.2 12.8 13.1  Hematocrit 36.0 - 46.0 % 36.4 40.7 38.3  Platelets 150.0 - 400.0 K/uL 288.0 206 185.0   Lab Results  Component Value Date   MCV 85.6 09/01/2018   MCV 92.7 07/20/2018   MCV 87.4 10/08/2017   Lab Results  Component Value Date   TSH 2.66 10/08/2017   Lab Results  Component Value Date   HGBA1C 5.2 10/08/2017     BNP    Component Value Date/Time   BNP 179.1 (H) 07/20/2018 1140    ProBNP    Component Value Date/Time   PROBNP 108.0 (H) 09/01/2018 1100     Lipid Panel     Component Value Date/Time   CHOL 212 (H) 10/08/2017 1104   TRIG 166.0 (H) 10/08/2017 1104   TRIG 185 02/11/2009   HDL 55.90 10/08/2017 1104   CHOLHDL 4 10/08/2017 1104   VLDL 33.2 10/08/2017 1104   LDLCALC 123 (H) 10/08/2017 1104     RADIOLOGY: Dg Chest 2 View  Result Date: 09/01/2018 CLINICAL DATA:  Shortness of breath and fatigue. History of heart failure and pulmonary hypertension. EXAM: CHEST - 2 VIEW COMPARISON:  06/15/2017 FINDINGS: Stable top-normal heart  size. Diffuse pulmonary interstitial prominence again noted, potentially mildly more prominent. Findings are felt to most likely relate to chronic interstitial edema. No overt airspace edema or pleural fluid identified. No airspace consolidation or pneumothorax. Bony structures are unremarkable. IMPRESSION: Diffuse pulmonary interstitial prominence, potentially slightly more prominent compared to the prior chest x-ray. Findings are felt to most likely relate to chronic interstitial pulmonary edema. Electronically Signed   By: Aletta Edouard M.D.   On: 09/01/2018 15:51     Additional studies/ records that were reviewed today include:  I review the office records of Dr. [name], the patient's sleep study, ECG, and most recent download.    ASSESSMENT:    1. Essential hypertension   2. Atrial fibrillation status post cardioversion, 01/30/15 maintaining SR.    3. Paroxysmal atrial fibrillation ([place])   4. Pulmonary HTN ([place])   5. Chronic diastolic (congestive) heart failure [facility])      PLAN:  Tiffany [name] is a very pleasant 73 year old female who has a history of paroxysmal atrial fibrillation and diastolic heart failure and was found to have significant pulmonary hypertension.  I reviewed her sleep study in detail with her, which demonstrates severe sleep apnea and significant oxygen desaturation to a nadir of 80%.  On her initial study, she had reduced sleep efficiency and moderate snoring.  She has been on CPAP therapy at 13 cm and is 100% compliance with reference to usage stays as well as usage greater than 4 hours.  She is averaging 9 hours and 17 minutes of sleep per night.  She has noticed some dryness.  I have suggested she use nasal saline in her nares, which will also help her sinuses and use this prior to CPAP use at night and also use in the morning.  We discussed alteration of her humidification.  She is meeting compliance standards.  Discussed the role of untreated sleep apnea in  cardiovascular disease and its association with mild primary hypertension.  I do not feel to severe pulmonary hypertension is related primarily  to her obstructive sleep apnea.  She has Raynaud's disease and does not have definitive crest syndrome.  Her blood pressure today in the office was excellent at 116/74.  She has a ResMed AirFit 10 small mask.  Per Medicare requirements, I will see her in one year for reevaluation.   Medication Adjustments/Labs and Tests Ordered: Current medicines are reviewed at length with the patient today.  Concerns regarding medicines are outlined above.  Medication changes, Labs and Tests ordered today are listed in the Patient Instructions below.  Patient Instructions  Medication Instructions:  The current medical regimen is effective;  continue present plan and medications.  If you need a refill on your cardiac medications before your next appointment, please call your pharmacy.    Follow-Up: At [facility], you and your health needs are our priority.  As part of our continuing mission to provide you with exceptional heart care, we have created designated Provider Care Teams.  These Care Teams include your primary Cardiologist (physician) and Advanced Practice Providers (APPs -  Physician Assistants and Nurse Practitioners) who all work together to provide you with the care you need, when you need it. You will need a follow up appointment in 12 months.  Please call our office 2 months in advance to schedule this appointment.  You may see Dr.Amauri Medellin or one of the following Advanced Practice Providers on your designated Care Team: Almyra Deforest, [place] . Fabian Sharp, PA-C      Time spent: 30 minutes Signed, Shelva Majestic, MD  09/03/2018 3:21 PM    [facility] [address], [place], [place], [place]  [address] Phone: [phone]

## 2018-09-01 NOTE — Patient Instructions (Signed)

## 2018-09-01 NOTE — Assessment & Plan Note (Signed)
FENO done in the office to rule out inflammation in the lungs which is 20 which rules out need for prednisone to help. Checking CXR which appears normal on read (will await radiologist confirmation) ruling out atypical pneumonia or fluid overload. Checking CMP, CBC, BNP to rule out anemia, worsening CKD, fluid overload. Treat as appropriate.

## 2018-09-01 NOTE — Telephone Encounter (Signed)
Called and talked with patient, she has NOT traveled internationally and has not been around anyone known to have traveled, she feels like this is probably a sinus infection--she has already called her cardiologist, but triage nurse there has not called back yet--had also already called her ENT, but could not get appt soon enough

## 2018-09-03 ENCOUNTER — Encounter: Payer: Self-pay | Admitting: Cardiovascular Disease

## 2018-09-13 ENCOUNTER — Encounter (HOSPITAL_COMMUNITY): Payer: Self-pay | Admitting: Nurse Practitioner

## 2018-09-13 ENCOUNTER — Ambulatory Visit (HOSPITAL_COMMUNITY)
Admission: RE | Admit: 2018-09-13 | Discharge: 2018-09-13 | Disposition: A | Discharge status: Home / Self Care | Payer: Medicare PPO | Source: Ambulatory Visit | Attending: Nurse Practitioner | Admitting: Nurse Practitioner

## 2018-09-13 VITALS — BP 114/62 | HR 77 | Ht 60.0 in | Wt 146.0 lb

## 2018-09-13 DIAGNOSIS — K219 Gastro-esophageal reflux disease without esophagitis: Secondary | ICD-10-CM | POA: Diagnosis not present

## 2018-09-13 DIAGNOSIS — Z9109 Other allergy status, other than to drugs and biological substances: Secondary | ICD-10-CM | POA: Diagnosis not present

## 2018-09-13 DIAGNOSIS — E785 Hyperlipidemia, unspecified: Secondary | ICD-10-CM | POA: Insufficient documentation

## 2018-09-13 DIAGNOSIS — I7300 Raynaud's syndrome without gangrene: Secondary | ICD-10-CM | POA: Diagnosis not present

## 2018-09-13 DIAGNOSIS — Z79899 Other long term (current) drug therapy: Secondary | ICD-10-CM | POA: Diagnosis not present

## 2018-09-13 DIAGNOSIS — G4733 Obstructive sleep apnea (adult) (pediatric): Secondary | ICD-10-CM | POA: Insufficient documentation

## 2018-09-13 DIAGNOSIS — I10 Essential (primary) hypertension: Secondary | ICD-10-CM | POA: Insufficient documentation

## 2018-09-13 DIAGNOSIS — E039 Hypothyroidism, unspecified: Secondary | ICD-10-CM | POA: Diagnosis not present

## 2018-09-13 DIAGNOSIS — Z888 Allergy status to other drugs, medicaments and biological substances status: Secondary | ICD-10-CM | POA: Insufficient documentation

## 2018-09-13 DIAGNOSIS — Z7901 Long term (current) use of anticoagulants: Secondary | ICD-10-CM | POA: Insufficient documentation

## 2018-09-13 DIAGNOSIS — I480 Paroxysmal atrial fibrillation: Secondary | ICD-10-CM | POA: Diagnosis not present

## 2018-09-13 DIAGNOSIS — Z880 Allergy status to penicillin: Secondary | ICD-10-CM | POA: Diagnosis not present

## 2018-09-13 MED ORDER — FLECAINIDE ACETATE 50 MG PO TABS: 50.0000 mg | ORAL_TABLET | Freq: Two times a day (BID) | ORAL | 3 refills | Status: DC

## 2018-09-13 MED ORDER — DRONEDARONE HCL 400 MG PO TABS: 400.0000 mg | ORAL_TABLET | Freq: Two times a day (BID) | ORAL | 1 refills | Status: DC

## 2018-09-13 NOTE — Addendum Note (Signed)
Encounter addended by: Juluis Mire, RN on: 09/13/2018 2:14 PM  Actions taken: Pharmacy for encounter modified, Order list changed

## 2018-09-13 NOTE — Progress Notes (Signed)
Primary Care Physician: [name], MD Referring Physician: Dr. [name] Cardiologist: Dr. Brantley Persons is a 73 y.o. female with a h/o atrial fibrillation and is referred by Dr [name] to Dr. [name] for treatment strategies related to her afib.  She was diagnosed with atrial fibrillation on presentation for cataract surgery.  In retrospect, she thinks that she has had afib for several years prior.  She has Tiffany [name] for which she is followed by Dr [name].  She also has severe sleep apnea for which she uses CPAP.  She has tried amiodarone previously in the setting of metoprolol and had symptomatic bradycardia with syncope.  She had symptoms of palpitations, presyncope, dyspnea with afib.  She underwent afib ablation 01/25/17 and has done well since then with brief episodes however, she has had more frequent episodes, about 2 per week, which can last as long as 6 hrs. She is symptomatic with palpitations and presyncope.    Today, she denies symptoms of chest pain, shortness of breath, orthopnea, PND, lower extremity edema, syncope, or neurologic sequela. The patient is tolerating medications without difficulties and is otherwise without complaint today.   Past Medical History:  Diagnosis Date  . Arthritis 04-20-12   Osteoarthritis-right hip  . Atrial fibrillation status post cardioversion, 01/30/15 maintaining SR.  01/31/2015   a. 01/2015 s/p TEE/DCCV;  b. 02/06/2015 recurrent AF noted, amio added;  c. CHA2DS2VASc = 3-->eliquis.  Marland Kitchen BENIGN POSITIONAL VERTIGO   . Cataract   . Complication of anesthesia 04-20-12   some issues with prolonged sedation after anesthesia  . Diverticulosis   . DYSLIPIDEMIA   . Esophageal stricture   . GERD   . HYPERTENSION    a. severe, pt intol of med tx, Pt. has severe "whitecoat" syndrome and refused medical therapy.  . Hypothyroidism 09/29/2014   a. pt did not tolerate synthroid and this was subsequently discontinued.  . Mild LV dysfunction    a.  01/2015 Echo: EF 45-50%, mild MR, mildly dil LA, mod dil RV with mod to sev reduced fxn, mod-sev TR, PASP 9mmHg.  . Osteopenia   . Raynaud disease   . URINARY INCONTINENCE 04-20-12   occ. with nighttime sleep pattern   Past Surgical History:  Procedure Laterality Date  . ATRIAL FIBRILLATION ABLATION N/A 01/26/2017   Procedure: Atrial Fibrillation Ablation;  Surgeon: Thompson Grayer, MD;  Location: [facility];  Service: Cardiovascular;  Laterality: N/A;  . breast ultrasound Right 09/13/13   There is no sonographic evidence of malignancy. the 4.9FW complicated cyst in (R) breast is consistent with a benign finding. repeat in 1 year  . CARDIAC CATHETERIZATION N/A 08/29/2015   Procedure: Right Heart Cath;  Surgeon: Larey Dresser, MD;  Location: [place];  Service: Cardiovascular;  Laterality: N/A;  . CARDIOVERSION N/A 01/30/2015   Procedure: CARDIOVERSION;  Surgeon: Sanda Klein, MD;  Location: [facility] ENDOS[facility];  Service: Cardiovascular;  Laterality: N/A;  . CHOLECYSTECTOMY     '90-"sludge"  . NASAL FRACTURE SURGERY  2006  . RIGHT HEART CATH N/A 06/30/2017   Procedure: RIGHT HEART CATH;  Surgeon: Larey Dresser, MD;  Location: [facility];  Service: Cardiovascular;  Laterality: N/A;  . TEE WITHOUT CARDIOVERSION N/A 01/30/2015   Procedure: TRANSESOPHAGEAL ECHOCARDIOGRAM (TEE);  Surgeon: Sanda Klein, MD;  Location: [facility] END[facility]COPY;  Service: Cardiovascular;  Laterality: N/A;  . TEE WITHOUT CARDIOVERSION N/A 01/25/2017   Procedure: TRANSESOPHAGEAL ECHOCARDIOGRAM (TEE);  Surgeon: Fay Records, MD;  Location:  [facility]OSCOPY;  Service: Cardiovascular;  Laterality: N/A;  . TOTAL HIP ARTHROPLASTY  04/26/2012   Procedure: TOTAL HIP ARTHROPLASTY ANTERIOR APPROACH;  Surgeon: Mauri Pole, MD;  Location: [facility] ORS;  Service: Orthopedics;  Laterality: Right;  . TUBAL LIGATION  1980    Current Outpatient Medications  Medication Sig Dispense Refill  . acetaminophen (TYLENOL) 325 MG tablet  Take 2 tablets (650 mg total) by mouth every 4 (four) hours as needed for headache or mild pain.    Marland Kitchen ELIQUIS 5 MG TABS tablet TAKE 1 TABLET BY MOUTH TWICE A DAY 180 tablet 1  . Estrogens, Conjugated (PREMARIN VA) Place 1 application vaginally once a week.    . famotidine (PEPCID) 20 MG tablet TAKE 1 TABLET BY MOUTH TWICE A DAY 180 tablet 3  . fluticasone (FLONASE) 50 MCG/ACT nasal spray Place 1 spray daily as needed into both nostrils for allergies or rhinitis.    . furosemide (LASIX) 40 MG tablet Take 2 tablets (80 mg total) by mouth daily. Can take 1 extra tab in PM for wt gain 30 tablet   . macitentan (OPSUMIT) 10 MG tablet Take 1 tablet (10 mg total) by mouth daily. 30 tablet 11  . metoprolol succinate (TOPROL-XL) 25 MG 24 hr tablet Take 1 tablet (25 mg total) by mouth daily. 90 tablet 3  . potassium chloride SA (KLOR-CON M20) 20 MEQ tablet Take 2 tablets (40 mEq total) by mouth daily. 180 tablet 3  . Probiotic Product (PROBIOTIC-10) CAPS Take 1 capsule by mouth 3 (three) times a week.     . Riociguat (ADEMPAS) 2.5 MG TABS Take 2.5 mg by mouth 3 (three) times daily. 90 tablet 3  . Selexipag (UPTRAVI) 600 MCG TABS Take 600 mcg by mouth 2 (two) times daily. 180 tablet 2   No current facility-administered medications for this encounter.     Allergies  Allergen Reactions  . Levothyroxine Shortness Of Breath and Other (See Comments)    Exhaustion also - Per pt her this medication could have not been the reason for her symptoms  . Statins Other (See Comments)    "Pain, weakness and kidney problems"  . Prednisone Other (See Comments)    Severe insomnia  . Penicillins Other (See Comments)    dry mouth Has patient had a PCN reaction causing immediate rash, facial/tongue/throat swelling, SOB or lightheadedness with hypotension: No Has patient had a PCN reaction causing severe rash involving mucus membranes or skin necrosis: No Has patient had a PCN reaction that required hospitalization  No Has patient had a PCN reaction occurring within the last 10 years: No If all of the above answers are "NO", then may proceed with Cephalosporin use.     Social History   Socioeconomic History  . Marital status: Married    Spouse name: Not on file  . Number of children: 2  . Years of education: Not on file  . Highest education level: Not on file  Occupational History  . Not on file  Social Needs  . Financial resource strain: Not on file  . Food insecurity:    Worry: Not on file    Inability: Not on file  . Transportation needs:    Medical: Not on file    Non-medical: Not on file  Tobacco Use  . Smoking status: Never Smoker  . Smokeless tobacco: Never Used  Substance and Sexual Activity  . Alcohol use: Yes    Alcohol/week: 2.0 standard drinks    Types: 2 Glasses  of wine per week    Comment: several glasses wine weekly  . Drug use: No  . Sexual activity: Yes  Lifestyle  . Physical activity:    Days per week: Not on file    Minutes per session: Not on file  . Stress: Not on file  Relationships  . Social connections:    Talks on phone: Not on file    Gets together: Not on file    Attends religious service: Not on file    Active member of club or organization: Not on file    Attends meetings of clubs or organizations: Not on file    Relationship status: Not on file  . Intimate partner violence:    Fear of current or ex partner: Not on file    Emotionally abused: Not on file    Physically abused: Not on file    Forced sexual activity: Not on file  Other Topics Concern  . Not on file  Social History Narrative   She enjoys birding.  Lives at home with husband.   Married.   retired Quarry manager, Tax inspector    Family History  Adopted: Yes  Problem Relation Age of Onset  . Other Other        patient was adopted    ROS- All systems are reviewed and negative except as per the HPI above  Physical Exam: Vitals:   09/13/18 1135  BP:  114/62  Pulse: 77  Weight: 66.2 kg  Height: 5' (1.524 m)   Wt Readings from Last 3 Encounters:  09/13/18 66.2 kg  09/01/18 65.8 kg  09/01/18 66.2 kg    Labs: Lab Results  Component Value Date   NA 138 09/01/2018   K 3.8 09/01/2018   CL 101 09/01/2018   CO2 26 09/01/2018   GLUCOSE 99 09/01/2018   BUN 31 (H) 09/01/2018   CREATININE 1.97 (H) 09/01/2018   CALCIUM 10.6 (H) 09/01/2018   MG 2.5 (H) 04/13/2017   Lab Results  Component Value Date   INR 0.98 04/20/2012   Lab Results  Component Value Date   CHOL 212 (H) 10/08/2017   HDL 55.90 10/08/2017   LDLCALC 123 (H) 10/08/2017   TRIG 166.0 (H) 10/08/2017     GEN- The patient is well appearing, alert and oriented x 3 today.   HEENT-head normocephalic, atraumatic, sclera clear, conjunctiva pink, hearing intact, trachea midline. Lungs- Clear to ausculation bilaterally, normal work of breathing Heart- Regular rate and rhythm, no murmurs, rubs or gallops  GI- soft, NT, ND, + BS Extremities- no clubbing, cyanosis, or edema MS- no significant deformity or atrophy Skin- no rash or lesion Psych- euthymic mood, full affect Neuro- strength and sensation are intact   EKG- SR HR 77, PR 150, QRS 457, QTc 457  Epic records reviewed   Assessment and Plan: 1. Paroxysmal afib s/p ablation 01/25/17. She reports that her episodes of afib are now occurring as often as twice per week and can last up to 6 hrs. She is symptomatic with presyncope and fatigue. Continue Eliquis 5 mg BID We discussed therapeutic options including Multaq, flecainide as well as implanting a loop recorder.  Start flecainide 50 mg BID. ECG check on f/u.  This patients CHA2DS2-VASc Score and unadjusted Ischemic Stroke Rate (% per year) is equal to 3.2 % stroke rate/year from a score of 3  Above score calculated as 1 point each if present [CHF, HTN, DM, Vascular=MI/PAD/Aortic Plaque, Age if 17-74, or Female]  Above score calculated as 2 points each if present  [Age > 75, or Stroke/TIA/TE]   2. PHT Per Dr. [name]  3. OSA Compliant with CPAP  4. HTN Stable, no changes today  F/u in Afib clinic in 1-2 weeks. F/u with Dr [name] in a month after that. F/u with Dr [name] as scheduled.    [facility] [address] [place], [place] [address] [phone]

## 2018-09-13 NOTE — Patient Instructions (Signed)
Start Multaq 400mg twice a day with food 

## 2018-09-23 ENCOUNTER — Telehealth (HOSPITAL_COMMUNITY): Payer: Self-pay

## 2018-09-23 ENCOUNTER — Ambulatory Visit (HOSPITAL_COMMUNITY)
Admission: RE | Admit: 2018-09-23 | Discharge: 2018-09-23 | Disposition: A | Discharge status: Home / Self Care | Payer: Medicare PPO | Source: Ambulatory Visit | Attending: Physician Assistant | Admitting: Physician Assistant

## 2018-09-23 VITALS — BP 108/58 | HR 63 | Ht 60.0 in | Wt 148.0 lb

## 2018-09-23 DIAGNOSIS — I7300 Raynaud's syndrome without gangrene: Secondary | ICD-10-CM | POA: Insufficient documentation

## 2018-09-23 DIAGNOSIS — E785 Hyperlipidemia, unspecified: Secondary | ICD-10-CM | POA: Insufficient documentation

## 2018-09-23 DIAGNOSIS — G4733 Obstructive sleep apnea (adult) (pediatric): Secondary | ICD-10-CM | POA: Diagnosis not present

## 2018-09-23 DIAGNOSIS — I480 Paroxysmal atrial fibrillation: Secondary | ICD-10-CM

## 2018-09-23 DIAGNOSIS — I10 Essential (primary) hypertension: Secondary | ICD-10-CM | POA: Diagnosis not present

## 2018-09-23 DIAGNOSIS — K219 Gastro-esophageal reflux disease without esophagitis: Secondary | ICD-10-CM | POA: Diagnosis not present

## 2018-09-23 DIAGNOSIS — Z888 Allergy status to other drugs, medicaments and biological substances status: Secondary | ICD-10-CM | POA: Diagnosis not present

## 2018-09-23 DIAGNOSIS — Z7901 Long term (current) use of anticoagulants: Secondary | ICD-10-CM | POA: Insufficient documentation

## 2018-09-23 DIAGNOSIS — Z880 Allergy status to penicillin: Secondary | ICD-10-CM | POA: Diagnosis not present

## 2018-09-23 DIAGNOSIS — E039 Hypothyroidism, unspecified: Secondary | ICD-10-CM | POA: Diagnosis not present

## 2018-09-23 DIAGNOSIS — Z79899 Other long term (current) drug therapy: Secondary | ICD-10-CM | POA: Insufficient documentation

## 2018-09-23 NOTE — Progress Notes (Signed)
Primary Care Physician: Tiffany Koch, MD Referring Physician: Dr. [name] Cardiologist: Dr. Brantley Tiffany [name] is a 73 y.o. female with a h/o atrial fibrillation and is referred by Dr Tiffany [name] to Dr. [name] for treatment strategies related to her afib.  She was diagnosed with atrial fibrillation on presentation for cataract surgery.  In retrospect, she thinks that she has had afib for several years prior.  She has [place] for which she is followed by Dr Tiffany [name].  She also has severe sleep apnea for which she uses CPAP.  She has tried amiodarone previously in the setting of metoprolol and had symptomatic bradycardia with syncope.  She had symptoms of palpitations, presyncope, dyspnea with afib.  She underwent afib ablation 01/25/17. Started on flecainide 50 mg BID at her last visit. She has not had any further symptoms of afib. Tolerating the medicine well.   Today, she denies symptoms of chest pain, shortness of breath, orthopnea, PND, lower extremity edema, syncope, or neurologic sequela. The patient is tolerating medications without difficulties and is otherwise without complaint today.   Past Medical History:  Diagnosis Date  . Arthritis 04-20-12   Osteoarthritis-right hip  . Atrial fibrillation status post cardioversion, 01/30/15 maintaining SR.  01/31/2015   a. 01/2015 s/p TEE/DCCV;  b. 02/06/2015 recurrent AF noted, amio added;  c. CHA2DS2VASc = 3-->eliquis.  Marland Kitchen BENIGN POSITIONAL VERTIGO   . Cataract   . Complication of anesthesia 04-20-12   some issues with prolonged sedation after anesthesia  . Diverticulosis   . DYSLIPIDEMIA   . Esophageal stricture   . GERD   . HYPERTENSION    a. severe, pt intol of med tx, Pt. has severe "whitecoat" syndrome and refused medical therapy.  . Hypothyroidism 09/29/2014   a. pt did not tolerate synthroid and this was subsequently discontinued.  . Mild LV dysfunction    a. 01/2015 Echo: EF 45-50%, mild MR, mildly dil LA, mod dil RV with mod to sev  reduced fxn, mod-sev TR, PASP 73mmHg.  . Osteopenia   . Raynaud disease   . URINARY INCONTINENCE 04-20-12   occ. with nighttime sleep pattern   Past Surgical History:  Procedure Laterality Date  . ATRIAL FIBRILLATION ABLATION N/A 01/26/2017   Procedure: Atrial Fibrillation Ablation;  Surgeon: Thompson Grayer, MD;  Location: [place] [facility];  Service: Cardiovascular;  Laterality: N/A;  . breast ultrasound Right 09/13/13   There is no sonographic evidence of malignancy. the 2.9HB complicated cyst in (R) breast is consistent with a benign finding. repeat in 1 year  . CARDIAC CATHETERIZATION N/A 08/29/2015   Procedure: Right Heart Cath;  Surgeon: Larey Dresser, MD;  Location: [place] [facility];  Service: Cardiovascular;  Laterality: N/A;  . CARDIOVERSION N/A 01/30/2015   Procedure: CARDIOVERSION;  Surgeon: Sanda Klein, MD;  Location: [facility];  Service: Cardiovascular;  Laterality: N/A;  . CHOLECYSTECTOMY     '90-"sludge"  . NASAL FRACTURE SURGERY  2006  . RIGHT HEART CATH N/A 06/30/2017   Procedure: RIGHT HEART CATH;  Surgeon: Larey Dresser, MD;  Location: [place] [facility];  Service: Cardiovascular;  Laterality: N/A;  . TEE WITHOUT CARDIOVERSION N/A 01/30/2015   Procedure: TRANSESOPHAGEAL ECHOCARDIOGRAM (TEE);  Surgeon: Sanda Klein, MD;  Location: [facility];  Service: Cardiovascular;  Laterality: N/A;  . TEE WITHOUT CARDIOVERSION N/A 01/25/2017   Procedure: TRANSESOPHAGEAL ECHOCARDIOGRAM (TEE);  Surgeon: Fay Records, MD;  Location: [place];  Service: Cardiovascular;  Laterality: N/A;  . TOTAL HIP ARTHROPLASTY  04/26/2012   Procedure: TOTAL HIP ARTHROPLASTY ANTERIOR APPROACH;  Surgeon: Mauri Pole, MD;  Location: [facility];  Service: Orthopedics;  Laterality: Right;  . TUBAL LIGATION  1980    Current Outpatient Medications  Medication Sig Dispense Refill  . acetaminophen (TYLENOL) 325 MG tablet Take 2 tablets (650 mg total) by mouth every 4 (four) hours as needed for  headache or mild pain.    Marland Kitchen ELIQUIS 5 MG TABS tablet TAKE 1 TABLET BY MOUTH TWICE A DAY 180 tablet 1  . Estrogens, Conjugated (PREMARIN VA) Place 1 application vaginally once a week.    . famotidine (PEPCID) 20 MG tablet TAKE 1 TABLET BY MOUTH TWICE A DAY 180 tablet 3  . flecainide (TAMBOCOR) 50 MG tablet Take 1 tablet (50 mg total) by mouth 2 (two) times daily. 60 tablet 3  . fluticasone (FLONASE) 50 MCG/ACT nasal spray Place 1 spray daily as needed into both nostrils for allergies or rhinitis.    . furosemide (LASIX) 40 MG tablet Take 2 tablets (80 mg total) by mouth daily. Can take 1 extra tab in PM for wt gain 30 tablet   . macitentan (OPSUMIT) 10 MG tablet Take 1 tablet (10 mg total) by mouth daily. 30 tablet 11  . metoprolol succinate (TOPROL-XL) 25 MG 24 hr tablet Take 1 tablet (25 mg total) by mouth daily. 90 tablet 3  . potassium chloride SA (KLOR-CON M20) 20 MEQ tablet Take 2 tablets (40 mEq total) by mouth daily. 180 tablet 3  . Probiotic Product (PROBIOTIC-10) CAPS Take 1 capsule by mouth 3 (three) times a week.     . Riociguat (ADEMPAS) 2.5 MG TABS Take 2.5 mg by mouth 3 (three) times daily. 90 tablet 3  . Selexipag (UPTRAVI) 600 MCG TABS Take 600 mcg by mouth 2 (two) times daily. 180 tablet 2   No current facility-administered medications for this encounter.     Allergies  Allergen Reactions  . Levothyroxine Shortness Of Breath and Other (See Comments)    Exhaustion also - Per pt her this medication could have not been the reason for her symptoms  . Statins Other (See Comments)    "Pain, weakness and kidney problems"  . Prednisone Other (See Comments)    Severe insomnia  . Penicillins Other (See Comments)    dry mouth Has patient had a PCN reaction causing immediate rash, facial/tongue/throat swelling, SOB or lightheadedness with hypotension: No Has patient had a PCN reaction causing severe rash involving mucus membranes or skin necrosis: No Has patient had a PCN reaction  that required hospitalization No Has patient had a PCN reaction occurring within the last 10 years: No If all of the above answers are "NO", then may proceed with Cephalosporin use.     Social History   Socioeconomic History  . Marital status: Married    Spouse name: Not on file  . Number of children: 2  . Years of education: Not on file  . Highest education level: Not on file  Occupational History  . Not on file  Social Needs  . Financial resource strain: Not on file  . Food insecurity:    Worry: Not on file    Inability: Not on file  . Transportation needs:    Medical: Not on file    Non-medical: Not on file  Tobacco Use  . Smoking status: Never Smoker  . Smokeless tobacco: Never Used  Substance and Sexual Activity  . Alcohol use: Yes    Alcohol/week: 2.0  standard drinks    Types: 2 Glasses of wine per week    Comment: several glasses wine weekly  . Drug use: No  . Sexual activity: Yes  Lifestyle  . Physical activity:    Days per week: Not on file    Minutes per session: Not on file  . Stress: Not on file  Relationships  . Social connections:    Talks on phone: Not on file    Gets together: Not on file    Attends religious service: Not on file    Active member of club or organization: Not on file    Attends meetings of clubs or organizations: Not on file    Relationship status: Not on file  . Intimate partner violence:    Fear of current or ex partner: Not on file    Emotionally abused: Not on file    Physically abused: Not on file    Forced sexual activity: Not on file  Other Topics Concern  . Not on file  Social History Narrative   She enjoys birding.  Lives at home with husband.   Married.   retired Quarry manager, Tax inspector    Family History  Adopted: Yes  Problem Relation Age of Onset  . Other Other        patient was adopted    ROS- All systems are reviewed and negative except as per the HPI above  Physical  Exam: Vitals:   09/23/18 1108  BP: (!) 108/58  Pulse: 63  SpO2: 93%  Weight: 67.1 kg  Height: 5' (1.524 m)   Wt Readings from Last 3 Encounters:  09/23/18 67.1 kg  09/13/18 66.2 kg  09/01/18 65.8 kg    Labs: Lab Results  Component Value Date   NA 138 09/01/2018   K 3.8 09/01/2018   CL 101 09/01/2018   CO2 26 09/01/2018   GLUCOSE 99 09/01/2018   BUN 31 (H) 09/01/2018   CREATININE 1.97 (H) 09/01/2018   CALCIUM 10.6 (H) 09/01/2018   MG 2.5 (H) 04/13/2017   Lab Results  Component Value Date   INR 0.98 04/20/2012   Lab Results  Component Value Date   CHOL 212 (H) 10/08/2017   HDL 55.90 10/08/2017   LDLCALC 123 (H) 10/08/2017   TRIG 166.0 (H) 10/08/2017     GEN- The patient is well appearing, alert and oriented x 3 today. HEENT-head normocephalic, atraumatic, sclera clear, conjunctiva pink, hearing intact, trachea midline. Lungs- Clear to ausculation bilaterally, normal work of breathing Heart- Regular rate and rhythm, no murmurs, rubs or gallops  GI- soft, NT, ND, + BS Extremities- no clubbing, cyanosis, or edema MS- no significant deformity or atrophy Skin- no rash or lesion Psych- euthymic mood, full affect Neuro- strength and sensation are intact    EKG- SR HR 63, PR 170, QRS 82, QTc 448  Epic records reviewed   Assessment and Plan: 1. Paroxysmal afib s/p ablation 01/25/17. Currently on flecainide. She reports that she has not had any further heart racing episodes. Continue Eliquis 5 mg BID Continue flecainide 50 mg BID. Intervals stable on ECG. If symptoms return, would consider ILR before pursuing repeat ablation.  This patients CHA2DS2-VASc Score and unadjusted Ischemic Stroke Rate (% per year) is equal to 3.2 % stroke rate/year from a score of 3  Above score calculated as 1 point each if present [CHF, HTN, DM, Vascular=MI/PAD/Aortic Plaque, Age if 65-74, or Female] Above score calculated as 2 points each if present [  Age > 60, or  Stroke/TIA/TE]   2. PHT Per Dr Tiffany [name].  3. OSA Compliant with CPAP therapy.  4. HTN Stable, no changes today.  F/u with Dr Tiffany [name] as scheduled. F/u in Afib Clinic in 4 months.   [facility] [address] [place], [place] [address] [phone]

## 2018-09-23 NOTE — Telephone Encounter (Signed)
Prior authorization submitted via CMM to [facility]. Awaiting response

## 2018-09-26 NOTE — Telephone Encounter (Signed)
Called and spoke to [facility] about letter received stating that the patient needed prior authorization for opsumit. Pt approved through 08/03/2019. Authorization #[id number]

## 2018-10-01 IMAGING — DX DG CHEST 2V
2 series · 2 of 2 positions shown · non-contrast
Comparison: 09/25/2015 .

CLINICAL DATA: Cough and congestion.

EXAM:
CHEST  2 VIEW

[chest pa]
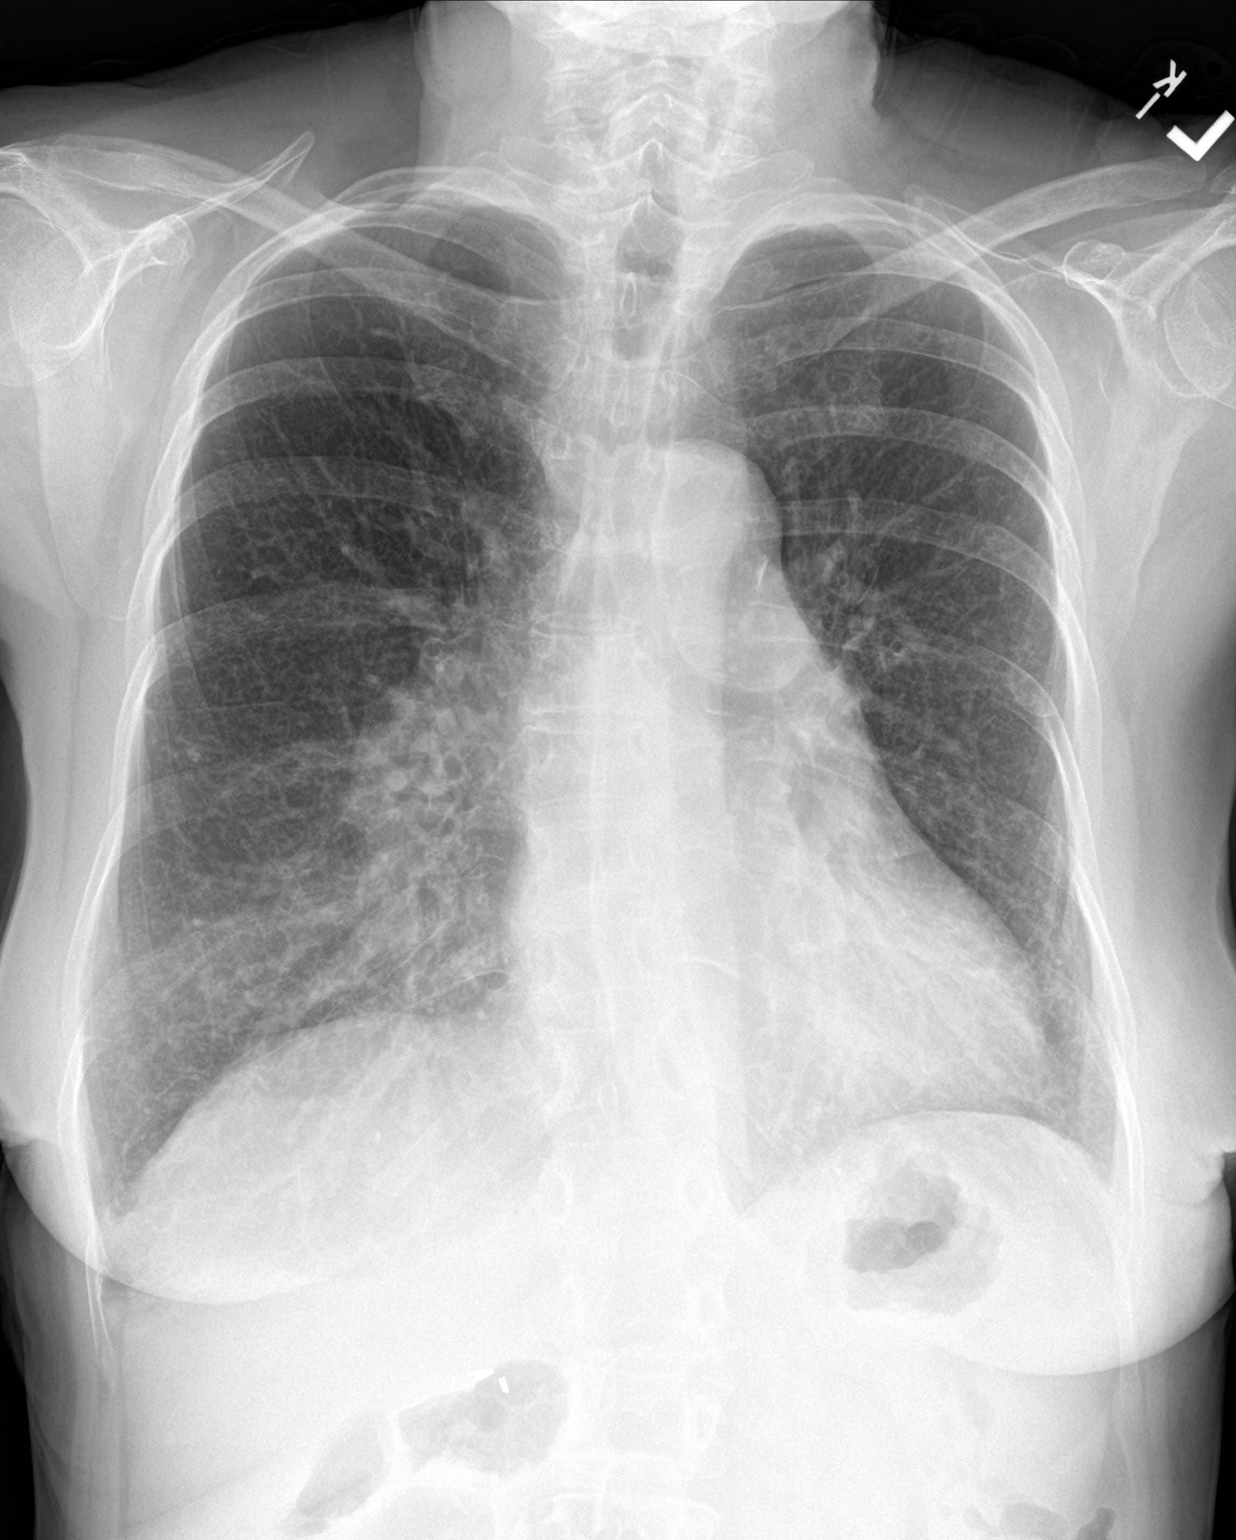

[chest lat]
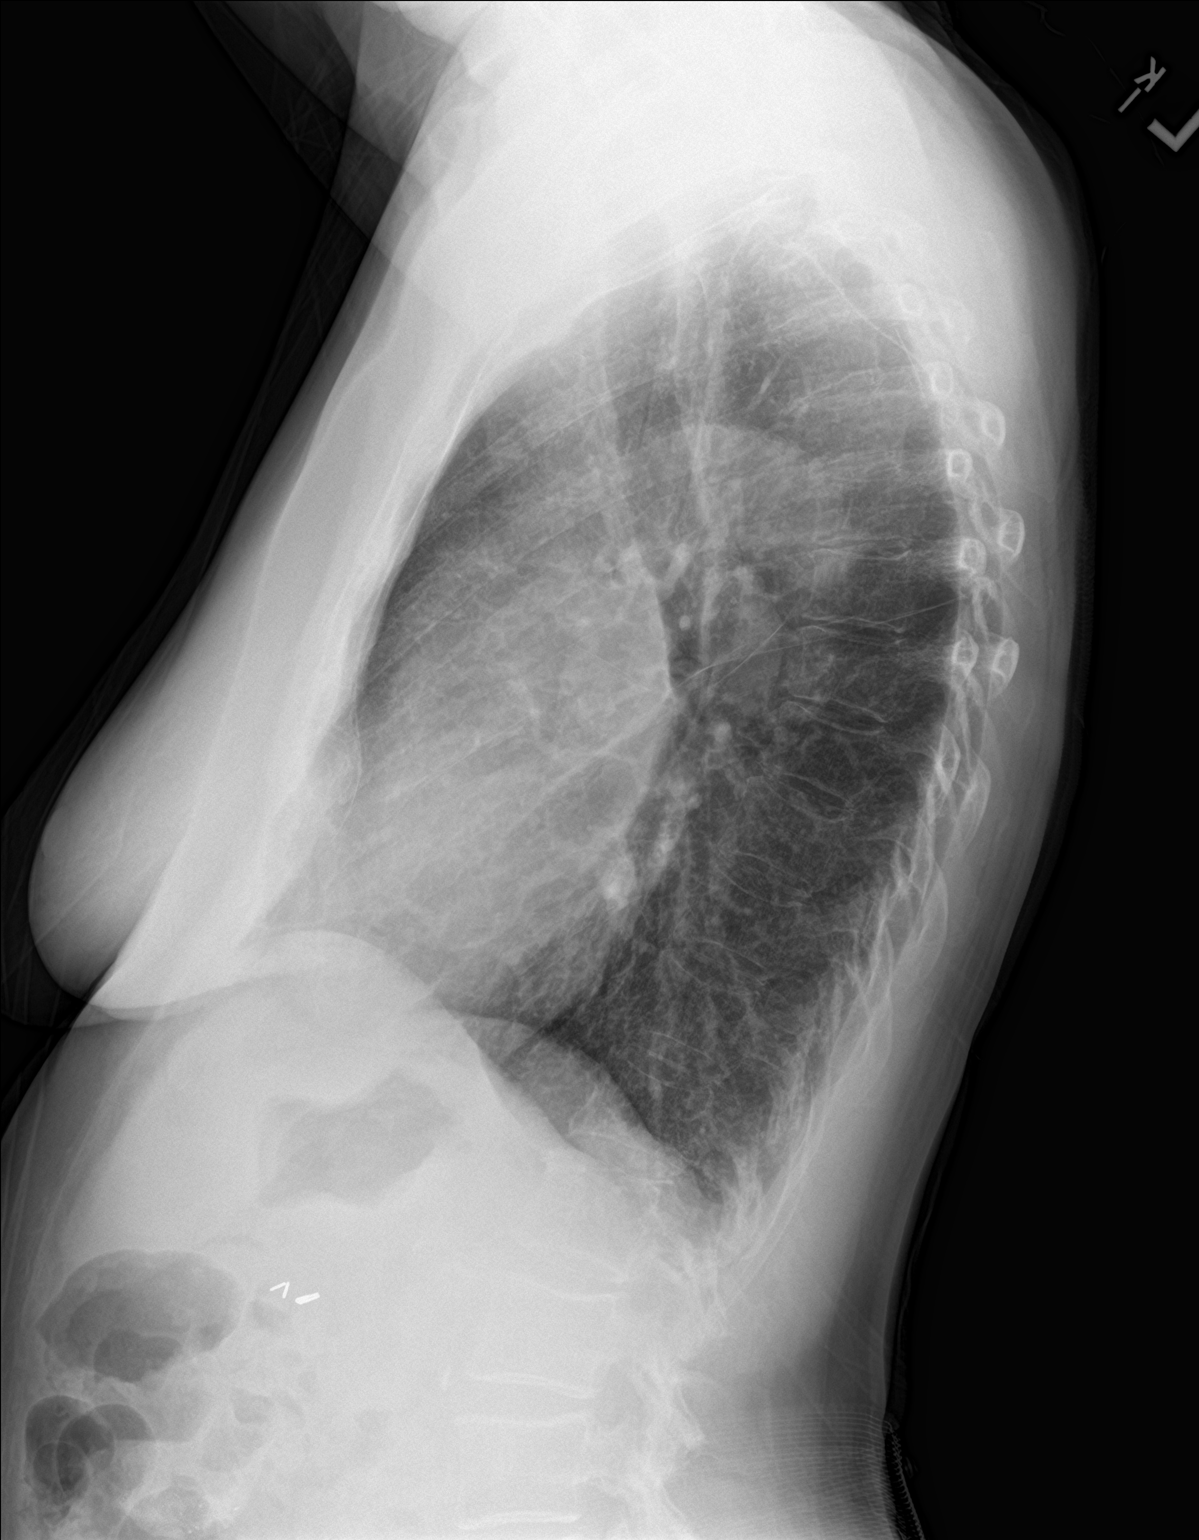

[2 of 2 positions shown; findings below may reference images not displayed]

FINDINGS: Cardiomegaly with pulmonary vascular prominence and bilateral
interstitial prominence with Kerley B-lines. Small bilateral pleural
effusions. Findings consistent congestive heart failure. Small
calcified nodules again noted consistent with granulomas. Surgical
clips right upper quadrant .
IMPRESSION: Congestive heart failure with mild pulmonary interstitial edema and
small pleural effusions.

## 2018-10-06 ENCOUNTER — Other Ambulatory Visit (HOSPITAL_COMMUNITY): Payer: Self-pay

## 2018-10-06 MED ORDER — RIOCIGUAT 2.5 MG PO TABS: 2.5000 mg | ORAL_TABLET | Freq: Three times a day (TID) | ORAL | 3 refills | Status: DC

## 2018-10-19 ENCOUNTER — Telehealth (HOSPITAL_COMMUNITY): Payer: Self-pay

## 2018-10-19 NOTE — Telephone Encounter (Signed)
Called patient to reschedule appointment currently on 3/25 d/t current state of emergency with Corona virus.  Pt reports feeling well with no exposure to anyone with a fever or cough.  No recent travel.  Pt denies cardiac symptoms currently.  Pt amendable to rescheduling appt from next week.  Pt encouraged to call office if any concerns at home ie change in status, refills etc. Appointment made for june and appointment card mailed. Verbalized understanding.

## 2018-10-26 ENCOUNTER — Encounter (HOSPITAL_COMMUNITY): Payer: Medicare Other | Admitting: Cardiology

## 2018-11-14 ENCOUNTER — Other Ambulatory Visit: Payer: Self-pay | Admitting: Internal Medicine

## 2018-11-14 ENCOUNTER — Other Ambulatory Visit (HOSPITAL_COMMUNITY): Payer: Self-pay | Admitting: Cardiology

## 2018-11-15 NOTE — Telephone Encounter (Signed)
Control database checked last refill:05/29/2017 LOV: acute 09/01/2018, annual 10/08/2017 BVQ:[id number]

## 2018-11-18 ENCOUNTER — Other Ambulatory Visit (HOSPITAL_COMMUNITY): Payer: Self-pay | Admitting: Internal Medicine

## 2018-11-21 ENCOUNTER — Other Ambulatory Visit (HOSPITAL_COMMUNITY): Payer: Self-pay | Admitting: Cardiology

## 2018-11-21 ENCOUNTER — Other Ambulatory Visit: Payer: Self-pay | Admitting: Internal Medicine

## 2018-11-21 NOTE — Telephone Encounter (Signed)
73 yo, 67.1kg,  Scr 1.97 on 09/01/18 Last OV 09/23/18 Indication afib

## 2019-01-05 ENCOUNTER — Other Ambulatory Visit (HOSPITAL_COMMUNITY): Payer: Self-pay

## 2019-01-05 MED ORDER — SELEXIPAG 600 MCG PO TABS: 600.0000 ug | ORAL_TABLET | Freq: Two times a day (BID) | ORAL | 2 refills | Status: DC

## 2019-01-09 ENCOUNTER — Other Ambulatory Visit (HOSPITAL_COMMUNITY): Payer: Self-pay | Admitting: Physician Assistant

## 2019-01-09 ENCOUNTER — Encounter (HOSPITAL_COMMUNITY): Payer: Medicare PPO | Admitting: Cardiology

## 2019-01-16 DIAGNOSIS — Z1231 Encounter for screening mammogram for malignant neoplasm of breast: Secondary | ICD-10-CM | POA: Diagnosis not present

## 2019-01-16 LAB — HM MAMMOGRAPHY

## 2019-01-23 ENCOUNTER — Encounter: Payer: Self-pay | Admitting: Internal Medicine

## 2019-01-23 NOTE — Progress Notes (Signed)
Abstracted and sent to scan  

## 2019-01-25 ENCOUNTER — Ambulatory Visit (HOSPITAL_COMMUNITY): Payer: Medicare PPO | Admitting: Physician Assistant

## 2019-01-30 NOTE — Progress Notes (Signed)
Primary Care Physician: Tiffany Koch, MD Referring Physician: Dr. [name] Cardiologist: Dr. Brantley Tiffany [name] is a 73 y.o. female with a h/o atrial fibrillation and is referred by Dr Tiffany [name] to Dr. [name] for treatment strategies related to her afib.  She was diagnosed with atrial fibrillation on presentation for cataract surgery.  In retrospect, she thinks that she has had afib for several years prior.  She has [place] for which she is followed by Dr Tiffany [name].  She also has severe sleep apnea for which she uses CPAP.  She has tried amiodarone previously in the setting of metoprolol and had symptomatic bradycardia with syncope.  She had symptoms of palpitations, presyncope, dyspnea with afib. She underwent afib ablation 01/25/17.   On follow up today, patient reports that she has done very well since her last visit with no sustained episodes of afib, only rare, brief palpitations. She is very happy with flecainide.   Today, she denies symptoms of chest pain, shortness of breath, orthopnea, PND, lower extremity edema, syncope, or neurologic sequela. The patient is tolerating medications without difficulties and is otherwise without complaint today.   Past Medical History:  Diagnosis Date  . Arthritis 04-20-12   Osteoarthritis-right hip  . Atrial fibrillation status post cardioversion, 01/30/15 maintaining SR.  01/31/2015   a. 01/2015 s/p TEE/DCCV;  b. 02/06/2015 recurrent AF noted, amio added;  c. CHA2DS2VASc = 3-->eliquis.  Marland Kitchen BENIGN POSITIONAL VERTIGO   . Cataract   . Complication of anesthesia 04-20-12   some issues with prolonged sedation after anesthesia  . Diverticulosis   . DYSLIPIDEMIA   . Esophageal stricture   . GERD   . HYPERTENSION    a. severe, pt intol of med tx, Pt. has severe "whitecoat" syndrome and refused medical therapy.  . Hypothyroidism 09/29/2014   a. pt did not tolerate synthroid and this was subsequently discontinued.  . Mild LV dysfunction    a. 01/2015 Echo: EF  45-50%, mild MR, mildly dil LA, mod dil RV with mod to sev reduced fxn, mod-sev TR, PASP 59mmHg.  . Osteopenia   . Raynaud disease   . URINARY INCONTINENCE 04-20-12   occ. with nighttime sleep pattern   Past Surgical History:  Procedure Laterality Date  . ATRIAL FIBRILLATION ABLATION N/A 01/26/2017   Procedure: Atrial Fibrillation Ablation;  Surgeon: Tiffany Grayer, MD;  Location: [place] [facility];  Service: Cardiovascular;  Laterality: N/A;  . breast ultrasound Right 09/13/13   There is no sonographic evidence of malignancy. the 7.0WU complicated cyst in (R) breast is consistent with a benign finding. repeat in 1 year  . CARDIAC CATHETERIZATION N/A 08/29/2015   Procedure: Right Heart Cath;  Surgeon: Larey Dresser, MD;  Location: [place] [facility];  Service: Cardiovascular;  Laterality: N/A;  . CARDIOVERSION N/A 01/30/2015   Procedure: CARDIOVERSION;  Surgeon: Tiffany Klein, MD;  Location: [facility];  Service: Cardiovascular;  Laterality: N/A;  . CHOLECYSTECTOMY     '90-"sludge"  . NASAL FRACTURE SURGERY  2006  . RIGHT HEART CATH N/A 06/30/2017   Procedure: RIGHT HEART CATH;  Surgeon: Larey Dresser, MD;  Location: [place] [facility];  Service: Cardiovascular;  Laterality: N/A;  . TEE WITHOUT CARDIOVERSION N/A 01/30/2015   Procedure: TRANSESOPHAGEAL ECHOCARDIOGRAM (TEE);  Surgeon: Tiffany Klein, MD;  Location: [facility];  Service: Cardiovascular;  Laterality: N/A;  . TEE WITHOUT CARDIOVERSION N/A 01/25/2017   Procedure: TRANSESOPHAGEAL ECHOCARDIOGRAM (TEE);  Surgeon: Fay Records, MD;  Location: [place];  Service: Cardiovascular;  Laterality: N/A;  . TOTAL HIP ARTHROPLASTY  04/26/2012   Procedure: TOTAL HIP ARTHROPLASTY ANTERIOR APPROACH;  Surgeon: Mauri Pole, MD;  Location: [facility];  Service: Orthopedics;  Laterality: Right;  . TUBAL LIGATION  1980    Current Outpatient Medications  Medication Sig Dispense Refill  . acetaminophen (TYLENOL) 325 MG tablet Take 2 tablets  (650 mg total) by mouth every 4 (four) hours as needed for headache or mild pain.    . diazepam (VALIUM) 5 MG tablet TAKE 1 TABLET EVERY 8 HOURS AS NEEDED FOR ANXIETY 30 tablet 2  . ELIQUIS 5 MG TABS tablet TAKE 1 TABLET BY MOUTH TWICE A DAY 180 tablet 1  . Estrogens, Conjugated (PREMARIN VA) Place 1 application vaginally once a week.    . famotidine (PEPCID) 20 MG tablet TAKE 1 TABLET BY MOUTH TWICE A DAY 180 tablet 3  . flecainide (TAMBOCOR) 50 MG tablet TAKE 1 TABLET BY MOUTH TWICE A DAY 180 tablet 1  . fluticasone (FLONASE) 50 MCG/ACT nasal spray Place 1 spray daily as needed into both nostrils for allergies or rhinitis.    . furosemide (LASIX) 40 MG tablet Take 80mg  in the AM daily. May take 40mg  in the PM as needed. 270 tablet 1  . KLOR-CON M20 20 MEQ tablet TAKE 2 TABLETS (40 MEQ TOTAL) BY MOUTH DAILY. 180 tablet 3  . macitentan (OPSUMIT) 10 MG tablet Take 1 tablet (10 mg total) by mouth daily. 30 tablet 11  . metoprolol succinate (TOPROL-XL) 25 MG 24 hr tablet TAKE 1 TABLET BY MOUTH EVERY DAY 90 tablet 3  . PREMARIN vaginal cream USE 1 APPLICATORFUL 2-3 TIMES WEEKLY 30 g 11  . Probiotic Product (PROBIOTIC-10) CAPS Take 1 capsule by mouth 3 (three) times a week.     . Riociguat (ADEMPAS) 2.5 MG TABS Take 2.5 mg by mouth 3 (three) times daily. 90 tablet 3  . Selexipag (UPTRAVI) 600 MCG TABS Take 600 mcg by mouth 2 (two) times daily. 180 tablet 2   No current facility-administered medications for this encounter.     Allergies  Allergen Reactions  . Levothyroxine Shortness Of Breath and Other (See Comments)    Exhaustion also - Per pt her this medication could have not been the reason for her symptoms  . Statins Other (See Comments)    "Pain, weakness and kidney problems"  . Prednisone Other (See Comments)    Severe insomnia  . Penicillins Other (See Comments)    dry mouth Has patient had a PCN reaction causing immediate rash, facial/tongue/throat swelling, SOB or lightheadedness  with hypotension: No Has patient had a PCN reaction causing severe rash involving mucus membranes or skin necrosis: No Has patient had a PCN reaction that required hospitalization No Has patient had a PCN reaction occurring within the last 10 years: No If all of the above answers are "NO", then may proceed with Cephalosporin use.     Social History   Socioeconomic History  . Marital status: Married    Spouse name: Not on file  . Number of children: 2  . Years of education: Not on file  . Highest education level: Not on file  Occupational History  . Not on file  Social Needs  . Financial resource strain: Not on file  . Food insecurity    Worry: Not on file    Inability: Not on file  . Transportation needs    Medical: Not on file    Non-medical: Not on  file  Tobacco Use  . Smoking status: Never Smoker  . Smokeless tobacco: Never Used  Substance and Sexual Activity  . Alcohol use: Yes    Alcohol/week: 2.0 standard drinks    Types: 2 Glasses of wine per week    Comment: several glasses wine weekly  . Drug use: No  . Sexual activity: Yes  Lifestyle  . Physical activity    Days per week: Not on file    Minutes per session: Not on file  . Stress: Not on file  Relationships  . Social Herbalist on phone: Not on file    Gets together: Not on file    Attends religious service: Not on file    Active member of club or organization: Not on file    Attends meetings of clubs or organizations: Not on file    Relationship status: Not on file  . Intimate partner violence    Fear of current or ex partner: Not on file    Emotionally abused: Not on file    Physically abused: Not on file    Forced sexual activity: Not on file  Other Topics Concern  . Not on file  Social History Narrative   She enjoys birding.  Lives at home with husband.   Married.   retired Quarry manager, Tax inspector    Family History  Adopted: Yes  Problem Relation Age of  Onset  . Other Other        patient was adopted    ROS- All systems are reviewed and negative except as per the HPI above  Physical Exam: Vitals:   01/31/19 1130  BP: (!) 116/58  Pulse: (!) 59  Weight: 66.7 kg  Height: 5' (1.524 m)   Wt Readings from Last 3 Encounters:  01/31/19 66.7 kg  09/23/18 67.1 kg  09/13/18 66.2 kg    Labs: Lab Results  Component Value Date   NA 138 09/01/2018   K 3.8 09/01/2018   CL 101 09/01/2018   CO2 26 09/01/2018   GLUCOSE 99 09/01/2018   BUN 31 (H) 09/01/2018   CREATININE 1.97 (H) 09/01/2018   CALCIUM 10.6 (H) 09/01/2018   MG 2.5 (H) 04/13/2017   Lab Results  Component Value Date   INR 0.98 04/20/2012   Lab Results  Component Value Date   CHOL 212 (H) 10/08/2017   HDL 55.90 10/08/2017   LDLCALC 123 (H) 10/08/2017   TRIG 166.0 (H) 10/08/2017   GEN- The patient is well appearing elderly female, alert and oriented x 3 today.   HEENT-head normocephalic, atraumatic, sclera clear, conjunctiva pink, hearing intact, trachea midline. Lungs- Clear to ausculation bilaterally, normal work of breathing Heart- Regular rate and rhythm, no murmurs, rubs or gallops  GI- soft, NT, ND, + BS Extremities- no clubbing, cyanosis, or edema MS- no significant deformity or atrophy Skin- no rash or lesion Psych- euthymic mood, full affect Neuro- strength and sensation are intact   EKG- SR HR 59, RAD, NST, PR 196, QRS 90, QTc 447  Epic records reviewed  Echo 07/20/18 - Left ventricle: The cavity size was normal. Wall thickness was   normal. Systolic function was vigorous. The estimated ejection   fraction was in the range of 65% to 70%. Wall motion was normal;   there were no regional wall motion abnormalities. Features are   consistent with a pseudonormal left ventricular filling pattern,   with concomitant abnormal relaxation and increased filling   pressure (  grade 2 diastolic dysfunction). GLS -24%, normal. - Aortic valve: There was mild  stenosis. There was trivial   regurgitation. Mean gradient (S): 9 mm Hg. Peak gradient (S): 18   mm Hg. Valve area (VTI): 1.48 cm^2. - Mitral valve: Mildly calcified annulus. There was mild   regurgitation. - Left atrium: The atrium was mildly dilated. - Right ventricle: The cavity size was normal. Systolic function   was normal. - Tricuspid valve: Peak RV-RA gradient (S): 39 mm Hg. - Pulmonary arteries: PA peak pressure: 47 mm Hg (S). - Systemic veins: IVC measured 2.3 cm with normal respirophasic   variation, suggesting RA pressure 8 mmHg.  Impressions:  - Normal LV size with EF 65-70%. Moderate diastolic dysfunction.   Normal RV size and systolic function. Mild to moderate pulmonary   hypertension, PASP 47 mmHg. Mild MR. Mild aortic stenosis.  Assessment and Plan: 1. Paroxysmal afib s/p ablation 01/25/17. Doing well with minimal symptoms. Continue Eliquis 5 mg BID Continue flecainide 50 mg BID.  Per Dr Tiffany [name], if symptoms return, would consider ILR before pursuing repeat ablation.  This patients CHA2DS2-VASc Score and unadjusted Ischemic Stroke Rate (% per year) is equal to 3.2 % stroke rate/year from a score of 3  Above score calculated as 1 point each if present [CHF, HTN, DM, Vascular=MI/PAD/Aortic Plaque, Age if 65-74, or Female] Above score calculated as 2 points each if present [Age > 75, or Stroke/TIA/TE]   2. PHT Followed by Dr Tiffany [name].  3. OSA Encouraged compliance with CPAP therapy.   4. HTN Stable, no changes today.  F/u with with Dr Tiffany [name] and Dr Tiffany [name] as scheduled.    [facility] [address] [place], [place] [address] [phone]

## 2019-01-31 ENCOUNTER — Ambulatory Visit (HOSPITAL_COMMUNITY)
Admission: RE | Admit: 2019-01-31 | Discharge: 2019-01-31 | Disposition: A | Discharge status: Home / Self Care | Payer: Medicare PPO | Source: Ambulatory Visit | Attending: Physician Assistant | Admitting: Physician Assistant

## 2019-01-31 ENCOUNTER — Encounter (HOSPITAL_COMMUNITY): Payer: Self-pay | Admitting: Physician Assistant

## 2019-01-31 ENCOUNTER — Other Ambulatory Visit: Payer: Self-pay

## 2019-01-31 VITALS — BP 116/58 | HR 59 | Ht 60.0 in | Wt 147.0 lb

## 2019-01-31 DIAGNOSIS — K219 Gastro-esophageal reflux disease without esophagitis: Secondary | ICD-10-CM | POA: Diagnosis not present

## 2019-01-31 DIAGNOSIS — E785 Hyperlipidemia, unspecified: Secondary | ICD-10-CM | POA: Diagnosis not present

## 2019-01-31 DIAGNOSIS — E039 Hypothyroidism, unspecified: Secondary | ICD-10-CM | POA: Diagnosis not present

## 2019-01-31 DIAGNOSIS — I10 Essential (primary) hypertension: Secondary | ICD-10-CM | POA: Diagnosis not present

## 2019-01-31 DIAGNOSIS — G4733 Obstructive sleep apnea (adult) (pediatric): Secondary | ICD-10-CM | POA: Insufficient documentation

## 2019-01-31 DIAGNOSIS — Z880 Allergy status to penicillin: Secondary | ICD-10-CM | POA: Diagnosis not present

## 2019-01-31 DIAGNOSIS — I480 Paroxysmal atrial fibrillation: Secondary | ICD-10-CM

## 2019-01-31 DIAGNOSIS — Z888 Allergy status to other drugs, medicaments and biological substances status: Secondary | ICD-10-CM | POA: Diagnosis not present

## 2019-01-31 DIAGNOSIS — Z7901 Long term (current) use of anticoagulants: Secondary | ICD-10-CM | POA: Insufficient documentation

## 2019-01-31 DIAGNOSIS — Z79899 Other long term (current) drug therapy: Secondary | ICD-10-CM | POA: Insufficient documentation

## 2019-01-31 DIAGNOSIS — I7300 Raynaud's syndrome without gangrene: Secondary | ICD-10-CM | POA: Diagnosis not present

## 2019-02-02 ENCOUNTER — Other Ambulatory Visit (HOSPITAL_COMMUNITY): Payer: Self-pay

## 2019-02-02 MED ORDER — ADEMPAS 2.5 MG PO TABS: 2.5000 mg | ORAL_TABLET | Freq: Three times a day (TID) | ORAL | 11 refills | Status: DC

## 2019-04-07 ENCOUNTER — Other Ambulatory Visit: Payer: Self-pay | Admitting: Cardiovascular Disease

## 2019-04-07 NOTE — Telephone Encounter (Signed)
54F 65.8 kg, SCr 1.97 (1/20), LOV TK 1/20

## 2019-04-07 NOTE — Telephone Encounter (Signed)
Refill request

## 2019-04-19 ENCOUNTER — Ambulatory Visit (INDEPENDENT_AMBULATORY_CARE_PROVIDER_SITE_OTHER): Payer: Medicare PPO | Admitting: Internal Medicine

## 2019-04-19 ENCOUNTER — Other Ambulatory Visit: Payer: Self-pay

## 2019-04-19 ENCOUNTER — Other Ambulatory Visit (INDEPENDENT_AMBULATORY_CARE_PROVIDER_SITE_OTHER): Payer: Medicare PPO

## 2019-04-19 ENCOUNTER — Encounter: Payer: Self-pay | Admitting: Internal Medicine

## 2019-04-19 VITALS — BP 142/90 | HR 59 | Temp 99.0°F | Ht 60.0 in | Wt 147.0 lb

## 2019-04-19 DIAGNOSIS — E038 Other specified hypothyroidism: Secondary | ICD-10-CM | POA: Diagnosis not present

## 2019-04-19 DIAGNOSIS — Z0000 Encounter for general adult medical examination without abnormal findings: Secondary | ICD-10-CM

## 2019-04-19 DIAGNOSIS — I10 Essential (primary) hypertension: Secondary | ICD-10-CM | POA: Diagnosis not present

## 2019-04-19 DIAGNOSIS — M341 CR(E)ST syndrome: Secondary | ICD-10-CM | POA: Diagnosis not present

## 2019-04-19 DIAGNOSIS — F419 Anxiety disorder, unspecified: Secondary | ICD-10-CM

## 2019-04-19 DIAGNOSIS — R0602 Shortness of breath: Secondary | ICD-10-CM

## 2019-04-19 DIAGNOSIS — E7849 Other hyperlipidemia: Secondary | ICD-10-CM | POA: Diagnosis not present

## 2019-04-19 DIAGNOSIS — Z23 Encounter for immunization: Secondary | ICD-10-CM | POA: Diagnosis not present

## 2019-04-19 LAB — COMPREHENSIVE METABOLIC PANEL
ALT: 14 U/L (ref 0–35)
AST: 19 U/L (ref 0–37)
Albumin: 4.3 g/dL (ref 3.5–5.2)
Alkaline Phosphatase: 54 U/L (ref 39–117)
BUN: 34 mg/dL — ABNORMAL HIGH (ref 6–23)
CO2: 26 mEq/L (ref 19–32)
Calcium: 9.7 mg/dL (ref 8.4–10.5)
Chloride: 104 mEq/L (ref 96–112)
Creatinine, Ser: 2.11 mg/dL — ABNORMAL HIGH (ref 0.40–1.20)
GFR: 22.84 mL/min — ABNORMAL LOW (ref >60.00)
Glucose, Bld: 93 mg/dL (ref 70–99)
Potassium: 3.7 mEq/L (ref 3.5–5.1)
Sodium: 140 mEq/L (ref 135–145)
Total Bilirubin: 0.8 mg/dL (ref 0.2–1.2)
Total Protein: 7.6 g/dL (ref 6.0–8.3)

## 2019-04-19 LAB — LIPID PANEL
Cholesterol: 188 mg/dL (ref 0–200)
HDL: 49.7 mg/dL (ref >39.00)
LDL Cholesterol: 105 mg/dL — ABNORMAL HIGH (ref 0–99)
NonHDL: 137.89
Total CHOL/HDL Ratio: 4
Triglycerides: 163 mg/dL — ABNORMAL HIGH (ref 0.0–149.0)
VLDL: 32.6 mg/dL (ref 0.0–40.0)

## 2019-04-19 LAB — CBC
HCT: 38.3 % (ref 36.0–46.0)
Hemoglobin: 12.8 g/dL (ref 12.0–15.0)
MCHC: 33.4 g/dL (ref 30.0–36.0)
MCV: 87 fl (ref 78.0–100.0)
Platelets: 193 10*3/uL (ref 150.0–400.0)
RBC: 4.4 Mil/uL (ref 3.87–5.11)
RDW: 17 % — ABNORMAL HIGH (ref 11.5–15.5)
WBC: 5.7 10*3/uL (ref 4.0–10.5)

## 2019-04-19 LAB — T4, FREE: Free T4: 1.3 ng/dL (ref 0.60–1.60)

## 2019-04-19 LAB — HEMOGLOBIN A1C: Hgb A1c MFr Bld: 5.2 % (ref 4.6–6.5)

## 2019-04-19 LAB — TSH: TSH: 0.15 u[IU]/mL — ABNORMAL LOW (ref 0.35–4.50)

## 2019-04-19 MED ORDER — PREMARIN 0.625 MG/GM VA CREA: TOPICAL_CREAM | VAGINAL | 11 refills | Status: DC

## 2019-04-19 NOTE — Patient Instructions (Signed)
Health Maintenance, Female Adopting a healthy lifestyle and getting preventive care are important in promoting health and wellness. Ask your health care provider about:  The right schedule for you to have regular tests and exams.  Things you can do on your own to prevent diseases and keep yourself healthy. What should I know about diet, weight, and exercise? Eat a healthy diet   Eat a diet that includes plenty of vegetables, fruits, low-fat dairy products, and lean protein.  Do not eat a lot of foods that are high in solid fats, added sugars, or sodium. Maintain a healthy weight Body mass index (BMI) is used to identify weight problems. It estimates body fat based on height and weight. Your health care provider can help determine your BMI and help you achieve or maintain a healthy weight. Get regular exercise Get regular exercise. This is one of the most important things you can do for your health. Most adults should:  Exercise for at least 150 minutes each week. The exercise should increase your heart rate and make you sweat (moderate-intensity exercise).  Do strengthening exercises at least twice a week. This is in addition to the moderate-intensity exercise.  Spend less time sitting. Even light physical activity can be beneficial. Watch cholesterol and blood lipids Have your blood tested for lipids and cholesterol at 75 years of age, then have this test every 5 years. Have your cholesterol levels checked more often if:  Your lipid or cholesterol levels are high.  You are older than 75 years of age.  You are at high risk for heart disease. What should I know about cancer screening? Depending on your health history and family history, you may need to have cancer screening at various ages. This may include screening for:  Breast cancer.  Cervical cancer.  Colorectal cancer.  Skin cancer.  Lung cancer. What should I know about heart disease, diabetes, and high blood  pressure? Blood pressure and heart disease  High blood pressure causes heart disease and increases the risk of stroke. This is more likely to develop in people who have high blood pressure readings, are of African descent, or are overweight.  Have your blood pressure checked: ? Every 3-5 years if you are 18-39 years of age. ? Every year if you are 40 years old or older. Diabetes Have regular diabetes screenings. This checks your fasting blood sugar level. Have the screening done:  Once every three years after age 40 if you are at a normal weight and have a low risk for diabetes.  More often and at a younger age if you are overweight or have a high risk for diabetes. What should I know about preventing infection? Hepatitis B If you have a higher risk for hepatitis B, you should be screened for this virus. Talk with your health care provider to find out if you are at risk for hepatitis B infection. Hepatitis C Testing is recommended for:  Everyone born from 1945 through 1965.  Anyone with known risk factors for hepatitis C. Sexually transmitted infections (STIs)  Get screened for STIs, including gonorrhea and chlamydia, if: ? You are sexually active and are younger than 75 years of age. ? You are older than 75 years of age and your health care provider tells you that you are at risk for this type of infection. ? Your sexual activity has changed since you were last screened, and you are at increased risk for chlamydia or gonorrhea. Ask your health care provider if   you are at risk.  Ask your health care provider about whether you are at high risk for HIV. Your health care provider may recommend a prescription medicine to help prevent HIV infection. If you choose to take medicine to prevent HIV, you should first get tested for HIV. You should then be tested every 3 months for as long as you are taking the medicine. Pregnancy  If you are about to stop having your period (premenopausal) and  you may become pregnant, seek counseling before you get pregnant.  Take 400 to 800 micrograms (mcg) of folic acid every day if you become pregnant.  Ask for birth control (contraception) if you want to prevent pregnancy. Osteoporosis and menopause Osteoporosis is a disease in which the bones lose minerals and strength with aging. This can result in bone fractures. If you are 65 years old or older, or if you are at risk for osteoporosis and fractures, ask your health care provider if you should:  Be screened for bone loss.  Take a calcium or vitamin D supplement to lower your risk of fractures.  Be given hormone replacement therapy (HRT) to treat symptoms of menopause. Follow these instructions at home: Lifestyle  Do not use any products that contain nicotine or tobacco, such as cigarettes, e-cigarettes, and chewing tobacco. If you need help quitting, ask your health care provider.  Do not use street drugs.  Do not share needles.  Ask your health care provider for help if you need support or information about quitting drugs. Alcohol use  Do not drink alcohol if: ? Your health care provider tells you not to drink. ? You are pregnant, may be pregnant, or are planning to become pregnant.  If you drink alcohol: ? Limit how much you use to 0-1 drink a day. ? Limit intake if you are breastfeeding.  Be aware of how much alcohol is in your drink. In the [place] one drink equals one 12 oz bottle of beer (355 mL), one 5 oz glass of wine (148 mL), or one 1 oz glass of hard liquor (44 mL). General instructions  Schedule regular health, dental, and eye exams.  Stay current with your vaccines.  Tell your health care provider if: ? You often feel depressed. ? You have ever been abused or do not feel safe at home. Summary  Adopting a healthy lifestyle and getting preventive care are important in promoting health and wellness.  Follow your health care provider's instructions about healthy  diet, exercising, and getting tested or screened for diseases.  Follow your health care provider's instructions on monitoring your cholesterol and blood pressure. This information is not intended to replace advice given to you by your health care provider. Make sure you discuss any questions you have with your health care provider. Document Released: 02/02/2011 Document Revised: 07/13/2018 Document Reviewed: 07/13/2018 Elsevier Patient Education  2020 Elsevier Inc.  

## 2019-04-19 NOTE — Progress Notes (Signed)
Subjective:   Patient ID: Tiffany [name], female    DOB: 12-17-1945, 72 y.o.   MRN: [id number]  HPI Here for medicare wellness and physical, no new complaints. Please see A/P for status and treatment of chronic medical problems. No A fib since March/April with medication change  Diet: heart healthy Physical activity: sedentary, more active now walking 1000 feet in 6 minutes Depression/mood screen: negative Hearing: Moderate loss bilaterally, hearing intact no aids isual acuity: grossly normal with lens, performs annual eye exam  ADLs: capable Fall risk: low Home safety: good Cognitive evaluation: intact to orientation, naming, recall and repetition EOL planning: adv directives discussed, in place    Office Visit from 04/19/2019 in [place]  PHQ-2 Total Score  0      I have personally reviewed and have noted 1. The patient's medical and social history - reviewed today no changes 2. Their use of alcohol, tobacco or illicit drugs 3. Their current medications and supplements 4. The patient's functional ability including ADL's, fall risks, home safety risks and hearing or visual impairment. 5. Diet and physical activities 6. Evidence for depression or mood disorders 7. Care team reviewed and updated  Patient Care Team: [name], MD as PCP - General (Internal Medicine) Thornell Sartorius, MD as Consulting Physician (Otolaryngology) Zonia Kief, MD (Rehabilitation) Keene Breath., MD (Ophthalmology) Minus Breeding, MD (Cardiology) Paralee Cancel, MD (Orthopedic Surgery) Irene Shipper, MD (Gastroenterology) Darleen Crocker, MD (Ophthalmology) Past Medical History:  Diagnosis Date  . Arthritis 04-20-12   Osteoarthritis-right hip  . Atrial fibrillation status post cardioversion, 01/30/15 maintaining SR.  01/31/2015   a. 01/2015 s/p TEE/DCCV;  b. 02/06/2015 recurrent AF noted, amio added;  c. CHA2DS2VASc = 3-->eliquis.  Marland Kitchen BENIGN POSITIONAL  VERTIGO   . Cataract   . Complication of anesthesia 04-20-12   some issues with prolonged sedation after anesthesia  . Diverticulosis   . DYSLIPIDEMIA   . Esophageal stricture   . GERD   . HYPERTENSION    a. severe, pt intol of med tx, Pt. has severe "whitecoat" syndrome and refused medical therapy.  . Hypothyroidism 09/29/2014   a. pt did not tolerate synthroid and this was subsequently discontinued.  . Mild LV dysfunction    a. 01/2015 Echo: EF 45-50%, mild MR, mildly dil LA, mod dil RV with mod to sev reduced fxn, mod-sev TR, PASP 8mmHg.  . Osteopenia   . Raynaud disease   . URINARY INCONTINENCE 04-20-12   occ. with nighttime sleep pattern   Past Surgical History:  Procedure Laterality Date  . ATRIAL FIBRILLATION ABLATION N/A 01/26/2017   Procedure: Atrial Fibrillation Ablation;  Surgeon: Thompson Grayer, MD;  Location: [place] [facility];  Service: Cardiovascular;  Laterality: N/A;  . breast ultrasound Right 09/13/13   There is no sonographic evidence of malignancy. the 123XX123 complicated cyst in (R) breast is consistent with a benign finding. repeat in 1 year  . CARDIAC CATHETERIZATION N/A 08/29/2015   Procedure: Right Heart Cath;  Surgeon: Larey Dresser, MD;  Location: [place] [facility];  Service: Cardiovascular;  Laterality: N/A;  . CARDIOVERSION N/A 01/30/2015   Procedure: CARDIOVERSION;  Surgeon: Sanda Klein, MD;  Location: [facility];  Service: Cardiovascular;  Laterality: N/A;  . CHOLECYSTECTOMY     '90-"sludge"  . NASAL FRACTURE SURGERY  2006  . RIGHT HEART CATH N/A 06/30/2017   Procedure: RIGHT HEART CATH;  Surgeon: Larey Dresser, MD;  Location: [place] [facility];  Service: Cardiovascular;  Laterality: N/A;  . TEE WITHOUT CARDIOVERSION N/A 01/30/2015   Procedure: TRANSESOPHAGEAL ECHOCARDIOGRAM (TEE);  Surgeon: Sanda Klein, MD;  Location: [facility] ENDOSCOPY;  Service: Cardiovascular;  Laterality: N/A;  . TEE WITHOUT CARDIOVERSION N/A 01/25/2017   Procedure:  TRANSESOPHAGEAL ECHOCARDIOGRAM (TEE);  Surgeon: Fay Records, MD;  Location: [facility] ENDOSCOPY;  Service: Cardiovascular;  Laterality: N/A;  . TOTAL HIP ARTHROPLASTY  04/26/2012   Procedure: TOTAL HIP ARTHROPLASTY ANTERIOR APPROACH;  Surgeon: Mauri Pole, MD;  Location: [facility];  Service: Orthopedics;  Laterality: Right;  . TUBAL LIGATION  1980   Family History  Adopted: Yes  Problem Relation Age of Onset  . Other Other        patient was adopted   Review of Systems  Constitutional: Negative.   HENT: Negative.   Eyes: Negative.   Respiratory: Positive for shortness of breath. Negative for cough and chest tightness.   Cardiovascular: Negative for chest pain, palpitations and leg swelling.  Gastrointestinal: Negative for abdominal distention, abdominal pain, constipation, diarrhea, nausea and vomiting.  Musculoskeletal: Negative.   Skin: Negative.   Neurological: Negative.   Psychiatric/Behavioral: Negative.     Objective:  Physical Exam Constitutional:      Appearance: She is well-developed.  HENT:     Head: Normocephalic and atraumatic.  Neck:     Musculoskeletal: Normal range of motion.  Cardiovascular:     Rate and Rhythm: Normal rate and regular rhythm.  Pulmonary:     Effort: Pulmonary effort is normal. No respiratory distress.     Breath sounds: Normal breath sounds. No wheezing or rales.  Abdominal:     General: Bowel sounds are normal. There is no distension.     Palpations: Abdomen is soft.     Tenderness: There is no abdominal tenderness. There is no rebound.  Skin:    General: Skin is warm and dry.  Neurological:     Mental Status: She is alert and oriented to person, place, and time.     Coordination: Coordination normal.     Vitals:   04/19/19 1120  BP: (!) 142/90  Pulse: (!) 59  Temp: 99 F (37.2 C)  TempSrc: Oral  SpO2: 97%  Weight: 147 lb (66.7 kg)  Height: 5' (1.524 m)    Assessment & Plan:  Flu shot given at visit

## 2019-04-21 NOTE — Assessment & Plan Note (Signed)
Feeling well overall and dealing with pandemic well. Valium prn.

## 2019-04-21 NOTE — Assessment & Plan Note (Signed)
Flu shot given. Pneumonia complete. Shingrix complete. Tetanus due later 2020 counseled. Colonoscopy due 2025. Mammogram due 2022, pap smear aged out and dexa due 2022. Counseled about sun safety and mole surveillance. Counseled about the dangers of distracted driving. Given 10 year screening recommendations.

## 2019-04-21 NOTE — Assessment & Plan Note (Signed)
Stable at this time. Swallowing well currently.

## 2019-04-21 NOTE — Assessment & Plan Note (Signed)
Not on meds, has had borderline levels in past. Checking TSH and free T4.

## 2019-04-21 NOTE — Assessment & Plan Note (Signed)
BP good and close to goal in office which is rare for patient. Normally has white coat hypertension. BP at goal at home. Checking CMP.

## 2019-04-21 NOTE — Assessment & Plan Note (Signed)
Better since being in sinus consistently.

## 2019-04-21 NOTE — Assessment & Plan Note (Signed)
Checking lipid panel and adjust as needed.  

## 2019-05-08 ENCOUNTER — Other Ambulatory Visit (HOSPITAL_COMMUNITY): Payer: Self-pay | Admitting: Physician Assistant

## 2019-05-10 ENCOUNTER — Telehealth (HOSPITAL_COMMUNITY): Payer: Self-pay | Admitting: Cardiology

## 2019-05-10 NOTE — Telephone Encounter (Signed)
Patient is scheduled for dental procedures 05/15/19, including an extraction. Patient would like to know how long will she need to hold eliquis? Please call [phone]  Please notify dentist- Dr.Poole [phone]

## 2019-05-11 NOTE — Telephone Encounter (Signed)
Received an actual clearance on Dr Orvis Brill letterhead, left for DM to complete

## 2019-05-11 NOTE — Telephone Encounter (Signed)
Dr. Orvis Brill office aware verbally and note/order faxed to [phone]  [facility] for patient

## 2019-05-11 NOTE — Telephone Encounter (Signed)
1 day prior, day of.

## 2019-05-12 ENCOUNTER — Ambulatory Visit: Payer: Medicare PPO | Admitting: Internal Medicine

## 2019-05-12 NOTE — Telephone Encounter (Signed)
Clearance faxed

## 2019-05-31 ENCOUNTER — Encounter: Payer: Self-pay | Admitting: Internal Medicine

## 2019-05-31 ENCOUNTER — Other Ambulatory Visit: Payer: Self-pay

## 2019-05-31 ENCOUNTER — Telehealth (INDEPENDENT_AMBULATORY_CARE_PROVIDER_SITE_OTHER): Payer: Medicare PPO | Admitting: Internal Medicine

## 2019-05-31 VITALS — BP 116/60 | HR 56 | Ht 60.0 in | Wt 144.0 lb

## 2019-05-31 DIAGNOSIS — I10 Essential (primary) hypertension: Secondary | ICD-10-CM

## 2019-05-31 DIAGNOSIS — I480 Paroxysmal atrial fibrillation: Secondary | ICD-10-CM | POA: Diagnosis not present

## 2019-05-31 DIAGNOSIS — G4733 Obstructive sleep apnea (adult) (pediatric): Secondary | ICD-10-CM | POA: Diagnosis not present

## 2019-05-31 NOTE — Progress Notes (Signed)
Electrophysiology TeleHealth Note  Due to national recommendations of social distancing due to [place] 19, an audio telehealth visit is felt to be most appropriate for this patient at this time.  Verbal consent was obtained by me for the telehealth visit today.  The patient does not have capability for a virtual visit.  A phone visit is therefore required today.   Date:  05/31/2019   ID:  Tiffany [name], DOB 1945/12/20, MRN [id number]  Location: patient's home  Provider location:  [facility]  Evaluation Performed: Follow-up visit  PCP:  [name], MD   Electrophysiologist:  Dr [name]  Chief Complaint:  palpitations  History of Present Illness:    Tiffany [name] is a 73 y.o. female who presents via telehealth conferencing today.  Since last being seen in our clinic, the patient reports doing very well.  Today, she denies symptoms of palpitations, chest pain, shortness of breath,  lower extremity edema, dizziness, presyncope, or syncope.  The patient is otherwise without complaint today.  The patient denies symptoms of fevers, chills, cough, or new SOB worrisome for COVID 19.  Past Medical History:  Diagnosis Date  . Arthritis 04-20-12   Osteoarthritis-right hip  . Atrial fibrillation status post cardioversion, 01/30/15 maintaining SR.  01/31/2015   a. 01/2015 s/p TEE/DCCV;  b. 02/06/2015 recurrent AF noted, amio added;  c. CHA2DS2VASc = 3-->eliquis.  Marland Kitchen BENIGN POSITIONAL VERTIGO   . Cataract   . Complication of anesthesia 04-20-12   some issues with prolonged sedation after anesthesia  . Diverticulosis   . DYSLIPIDEMIA   . Esophageal stricture   . GERD   . HYPERTENSION    a. severe, pt intol of med tx, Pt. has severe "whitecoat" syndrome and refused medical therapy.  . Hypothyroidism 09/29/2014   a. pt did not tolerate synthroid and this was subsequently discontinued.  . Mild LV dysfunction    a. 01/2015 Echo: EF 45-50%, mild MR, mildly dil LA, mod dil RV with mod to  sev reduced fxn, mod-sev TR, PASP 70mmHg.  . Osteopenia   . Raynaud disease   . URINARY INCONTINENCE 04-20-12   occ. with nighttime sleep pattern    Past Surgical History:  Procedure Laterality Date  . ATRIAL FIBRILLATION ABLATION N/A 01/26/2017   Procedure: Atrial Fibrillation Ablation;  Surgeon: Thompson Grayer, MD;  Location: [place] [facility];  Service: Cardiovascular;  Laterality: N/A;  . breast ultrasound Right 09/13/13   There is no sonographic evidence of malignancy. the 123XX123 complicated cyst in (R) breast is consistent with a benign finding. repeat in 1 year  . CARDIAC CATHETERIZATION N/A 08/29/2015   Procedure: Right Heart Cath;  Surgeon: Larey Dresser, MD;  Location: [place] [facility];  Service: Cardiovascular;  Laterality: N/A;  . CARDIOVERSION N/A 01/30/2015   Procedure: CARDIOVERSION;  Surgeon: Sanda Klein, MD;  Location: [facility] ENDOSCOPY;  Service: Cardiovascular;  Laterality: N/A;  . CHOLECYSTECTOMY     '90-"sludge"  . NASAL FRACTURE SURGERY  2006  . RIGHT HEART CATH N/A 06/30/2017   Procedure: RIGHT HEART CATH;  Surgeon: Larey Dresser, MD;  Location: [place] [facility];  Service: Cardiovascular;  Laterality: N/A;  . TEE WITHOUT CARDIOVERSION N/A 01/30/2015   Procedure: TRANSESOPHAGEAL ECHOCARDIOGRAM (TEE);  Surgeon: Sanda Klein, MD;  Location: [facility] ENDOSCOPY;  Service: Cardiovascular;  Laterality: N/A;  . TEE WITHOUT CARDIOVERSION N/A 01/25/2017   Procedure: TRANSESOPHAGEAL ECHOCARDIOGRAM (TEE);  Surgeon: Fay Records, MD;  Location: [place];  Service: Cardiovascular;  Laterality: N/A;  . TOTAL HIP ARTHROPLASTY  04/26/2012   Procedure: TOTAL HIP ARTHROPLASTY ANTERIOR APPROACH;  Surgeon: Mauri Pole, MD;  Location: [facility];  Service: Orthopedics;  Laterality: Right;  . TUBAL LIGATION  1980    Current Outpatient Medications  Medication Sig Dispense Refill  . acetaminophen (TYLENOL) 325 MG tablet Take 2 tablets (650 mg total) by mouth every 4 (four) hours as  needed for headache or mild pain.    . Calcium Carb-Cholecalciferol (CALCIUM 600+D) 600-800 MG-UNIT TABS Take 1 tablet by mouth daily.    Marland Kitchen conjugated estrogens (PREMARIN) vaginal cream USE 1 APPLICATORFUL 2-3 TIMES WEEKLY 30 g 11  . diazepam (VALIUM) 5 MG tablet TAKE 1 TABLET EVERY 8 HOURS AS NEEDED FOR ANXIETY 30 tablet 2  . ELIQUIS 5 MG TABS tablet TAKE 1 TABLET BY MOUTH TWICE A DAY 180 tablet 1  . famotidine (PEPCID) 20 MG tablet TAKE 1 TABLET BY MOUTH TWICE A DAY 180 tablet 3  . flecainide (TAMBOCOR) 50 MG tablet Take 1 tablet (50 mg total) by mouth 2 (two) times daily. Please keep upcoming appt with Dr. [name] in November before anymore refills. Thank you 180 tablet 0  . fluticasone (FLONASE) 50 MCG/ACT nasal spray Place 1 spray daily as needed into both nostrils for allergies or rhinitis.    . furosemide (LASIX) 40 MG tablet Take 80mg  in the AM daily. May take 40mg  in the PM as needed. 270 tablet 1  . KLOR-CON M20 20 MEQ tablet TAKE 2 TABLETS (40 MEQ TOTAL) BY MOUTH DAILY. 180 tablet 3  . macitentan (OPSUMIT) 10 MG tablet Take 1 tablet (10 mg total) by mouth daily. 30 tablet 11  . metoprolol succinate (TOPROL-XL) 25 MG 24 hr tablet TAKE 1 TABLET BY MOUTH EVERY DAY 90 tablet 3  . Probiotic Product (PROBIOTIC-10) CAPS Take 1 capsule by mouth 3 (three) times a week.     . Riociguat (ADEMPAS) 2.5 MG TABS Take 2.5 mg by mouth 3 (three) times daily. 90 tablet 11  . Selexipag (UPTRAVI) 600 MCG TABS Take 600 mcg by mouth 2 (two) times daily. 180 tablet 2   No current facility-administered medications for this visit.     Allergies:   Levothyroxine, Statins, Prednisone, and Penicillins   Social History:  The patient  reports that she has never smoked. She has never used smokeless tobacco. She reports current alcohol use of about 2.0 standard drinks of alcohol per week. She reports that she does not use drugs.   Family History:  The patient's family history includes Other in an other family  member. She was adopted.   ROS:  Please see the history of present illness.   All other systems are personally reviewed and negative.    Exam:    Vital Signs:  Pulse (!) 56   Ht 5' (1.524 m)   Wt 144 lb (65.3 kg)   BMI 28.12 kg/m   Well sounding, alert and conversant  Labs/Other Tests and Data Reviewed:    Recent Labs: 07/20/2018: B Natriuretic Peptide 179.1 09/01/2018: Pro B Natriuretic peptide (BNP) 108.0 04/19/2019: ALT 14; BUN 34; Creatinine, Ser 2.11; Hemoglobin 12.8; Platelets 193.0; Potassium 3.7; Sodium 140; TSH 0.15   Wt Readings from Last 3 Encounters:  05/31/19 144 lb (65.3 kg)  04/19/19 147 lb (66.7 kg)  01/31/19 147 lb (66.7 kg)     ASSESSMENT & PLAN:    1.  Paroxysmal atrial fibrillation Well controlled with flecainide Last noticeable episode was 10/25/2018 She  reports that flecainide has done "wonders for my heart and state of mind".  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 OSA Compliant with CPAP  3. HTN Stable No change required today  4. Pulmonary hypertension Per Dr [name]   Follow-up:  AF clinic in 6 months   Patient Risk:  after full review of this patients clinical status, I feel that they are at moderate risk at this time.  Today, I have spent 15 minutes with the patient with telehealth technology discussing arrhythmia management .    SignedThompson Grayer, MD  05/31/2019 3:16 PM     [place] [phone] (office) [phone] (fax)

## 2019-06-12 ENCOUNTER — Ambulatory Visit: Payer: Medicare PPO | Admitting: Internal Medicine

## 2019-07-11 ENCOUNTER — Other Ambulatory Visit (HOSPITAL_COMMUNITY): Payer: Self-pay | Admitting: Cardiology

## 2019-08-08 ENCOUNTER — Ambulatory Visit (HOSPITAL_COMMUNITY)
Admission: RE | Admit: 2019-08-08 | Discharge: 2019-08-08 | Disposition: A | Discharge status: Home / Self Care | Payer: Medicare PPO | Source: Ambulatory Visit | Attending: Cardiology | Admitting: Cardiology

## 2019-08-08 ENCOUNTER — Other Ambulatory Visit: Payer: Self-pay

## 2019-08-08 ENCOUNTER — Encounter (HOSPITAL_COMMUNITY): Payer: Self-pay | Admitting: Cardiology

## 2019-08-08 VITALS — BP 110/74 | HR 61 | Wt 150.8 lb

## 2019-08-08 DIAGNOSIS — I272 Pulmonary hypertension, unspecified: Secondary | ICD-10-CM

## 2019-08-08 DIAGNOSIS — F329 Major depressive disorder, single episode, unspecified: Secondary | ICD-10-CM | POA: Insufficient documentation

## 2019-08-08 DIAGNOSIS — I5032 Chronic diastolic (congestive) heart failure: Secondary | ICD-10-CM | POA: Diagnosis not present

## 2019-08-08 DIAGNOSIS — I130 Hypertensive heart and chronic kidney disease with heart failure and stage 1 through stage 4 chronic kidney disease, or unspecified chronic kidney disease: Secondary | ICD-10-CM | POA: Insufficient documentation

## 2019-08-08 DIAGNOSIS — I2721 Secondary pulmonary arterial hypertension: Secondary | ICD-10-CM | POA: Insufficient documentation

## 2019-08-08 DIAGNOSIS — N183 Chronic kidney disease, stage 3 unspecified: Secondary | ICD-10-CM | POA: Insufficient documentation

## 2019-08-08 DIAGNOSIS — Z7901 Long term (current) use of anticoagulants: Secondary | ICD-10-CM | POA: Insufficient documentation

## 2019-08-08 DIAGNOSIS — K219 Gastro-esophageal reflux disease without esophagitis: Secondary | ICD-10-CM | POA: Diagnosis not present

## 2019-08-08 DIAGNOSIS — I480 Paroxysmal atrial fibrillation: Secondary | ICD-10-CM | POA: Diagnosis not present

## 2019-08-08 DIAGNOSIS — Z79899 Other long term (current) drug therapy: Secondary | ICD-10-CM | POA: Diagnosis not present

## 2019-08-08 DIAGNOSIS — F419 Anxiety disorder, unspecified: Secondary | ICD-10-CM | POA: Insufficient documentation

## 2019-08-08 DIAGNOSIS — G4733 Obstructive sleep apnea (adult) (pediatric): Secondary | ICD-10-CM | POA: Insufficient documentation

## 2019-08-08 DIAGNOSIS — E041 Nontoxic single thyroid nodule: Secondary | ICD-10-CM | POA: Insufficient documentation

## 2019-08-08 DIAGNOSIS — E039 Hypothyroidism, unspecified: Secondary | ICD-10-CM | POA: Diagnosis not present

## 2019-08-08 LAB — BASIC METABOLIC PANEL
Anion gap: 10 (ref 5–15)
BUN: 43 mg/dL — ABNORMAL HIGH (ref 8–23)
CO2: 24 mmol/L (ref 22–32)
Calcium: 9.3 mg/dL (ref 8.9–10.3)
Chloride: 104 mmol/L (ref 98–111)
Creatinine, Ser: 2.58 mg/dL — ABNORMAL HIGH (ref 0.44–1.00)
GFR calc Af Amer: 20 mL/min — ABNORMAL LOW (ref >60)
GFR calc non Af Amer: 18 mL/min — ABNORMAL LOW (ref >60)
Glucose, Bld: 94 mg/dL (ref 70–99)
Potassium: 4 mmol/L (ref 3.5–5.1)
Sodium: 138 mmol/L (ref 135–145)

## 2019-08-08 LAB — CBC
HCT: 37.2 % (ref 36.0–46.0)
Hemoglobin: 11.5 g/dL — ABNORMAL LOW (ref 12.0–15.0)
MCH: 28.5 pg (ref 26.0–34.0)
MCHC: 30.9 g/dL (ref 30.0–36.0)
MCV: 92.1 fL (ref 80.0–100.0)
Platelets: 201 10*3/uL (ref 150–400)
RBC: 4.04 MIL/uL (ref 3.87–5.11)
RDW: 15.6 % — ABNORMAL HIGH (ref 11.5–15.5)
WBC: 4.9 10*3/uL (ref 4.0–10.5)
nRBC: 0 % (ref 0.0–0.2)

## 2019-08-08 LAB — T4, FREE: Free T4: 1.49 ng/dL — ABNORMAL HIGH (ref 0.61–1.12)

## 2019-08-08 LAB — TSH: TSH: 0.189 u[IU]/mL — ABNORMAL LOW (ref 0.350–4.500)

## 2019-08-08 MED ORDER — OPSUMIT 10 MG PO TABS: 10.0000 mg | ORAL_TABLET | Freq: Every day | ORAL | 11 refills | Status: DC

## 2019-08-08 MED ORDER — POTASSIUM CHLORIDE 20 MEQ PO PACK: 40.0000 meq | PACK | Freq: Every day | ORAL | 5 refills | Status: DC

## 2019-08-08 MED ORDER — ADEMPAS 2.5 MG PO TABS: 2.5000 mg | ORAL_TABLET | Freq: Three times a day (TID) | ORAL | 11 refills | Status: DC

## 2019-08-08 MED ORDER — UPTRAVI 600 MCG PO TABS: 600.0000 ug | ORAL_TABLET | Freq: Two times a day (BID) | ORAL | 2 refills | Status: DC

## 2019-08-08 NOTE — Progress Notes (Signed)
Patient ID: [name], female   DOB: January 02, 1946, 74 y.o.   MRN: [id number] PCP: Dr. Sharlet Salina HF Cardiology: Dr. [name]  74 y.o. with history of paroxysmal atrial fibrillation and diastolic CHF was noted to have significant pulmonary hypertension and exertional dyspnea.  She has history of paroxysmal atrial fibrillation and had an episode in 6/16 requiring cardioversion.  She was started on amiodarone but this was stopped with worsening breathing.  Cardiolite in 9/16 showed on ischemia or infarction.     I had her do a RHC in 1/17.  This showed elevated left and right heart filling pressures but also PAH. After cath, she increased her Lasix from 40 qod to 40 daily.  At a prior appointment, I increased Lasix to 40 mg bid and also started her on Opsumit.  At next appointment, I added Adcirca 20 and then titrated it up to 40 mg daily. She continue to be volume overloaded, so  I increased Lasix to 80 qam/40 qpm. She has seemed to do well on this dose.   Echo was done in 6/17, RV mildly dilated with moderately decreased systolic function and PASP 85 mmHg.    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   She had an atrial fibrillation ablation in 6/18.   RHC in 11/18 showed lower PA pressure than in the past with PA 54/16 and PVR 3.7 WU.  Echo showed preserved LV systolic function and normal RV size and function.  Holter showed PACs and PVCs, no atrial fibrillation.  I started her on Toprol XL.  CT chest did not show any significant lung findings. Echo in 12/19 showed EF 65-70%, moderate diastolic dysfunction, mild AS, mild MR, PASP 47 mmHg, normal RV.   She was started on flecainide for paroxysmal atrial fibrillation and has not had symptomatic atrial fibrillation for months now.   She returns today for followup of diastolic CHF and pulmonary hypertension. Generally doing well.  No dyspnea on walking on flat ground.  She gets lightheaded when she bends over.  No syncope.   She is in NSR today, no palpitations recently.  No chest pain.  No orthopnea/PND. She does not want to do a 6 minute walk today because she has a hard time wearing a mask.   6 minute walk (2/17): 141 m 6 minute walk (3/17): 182 m 6 minute walk (5/17): 229 m 6 minute walk (10/17): 305 m 6 minute walk (5/18): 256 m 6 minute walk (8/18): 427 m 6 minute walk (11/18): 61 m, oxygen saturation dropped as low as 70s.  6 minute walk (3/19): 260 m 6 minute walk (6/19): 372 m 6 minute walk (12/19): [place] (12/16): BNP 1187, ANA 1:640, TSH elevated. Labs (2/17): K 5.4 => 3.9, creatinine 1.78 => 1.66, LDL 112, BNP 880 Labs (4/17): K 4.5, creatinine 1.76 Labs (5/17): K 4.1, creatinine 1.79 Labs (8/17): K 3.1, creatinine 1.7, BNP 404 Labs (9/17): K 3.9, creatinine 1.8, BNP 512 Labs (2/18): K 3.5, creatinine 1.71, BNP 157 Labs (5/18): LDL 120 Labs (6/18): K 3.6, creatinine 1.85 Labs (8/18): K 3.5, creatinine 1.78, BNP 88 Labs (11/18): K 3.6 => 3.2, creatinine 1.84 => 1.7, hgb 12.7 => 11.2. Labs (3/19): K 3.8, creatinine 1.74, LDL 123, HDL 56, TSH normal Labs (6/19): K 3.5, creatinine 1.91 Labs (9/19): K 3.5, creatinine 1.9  PMH:  1. CKD stage III 2. Bradycardia in setting of metoprolol + amiodarone use.  3. Raynauds phenomenon. 4.  BPPV 5. OA right hip 6. Hypothyroidism 7. Atrial fibrillation: Paroxysmal.  She was on amiodarone in the past but this was stopped when she became more short of breath.  TEE-guided DCCV in 6/16.  - Atrial fibrillation ablation 6/18.  - Holter (12/18) with PACs/PVCs, no atrial fibrillation.  8. HTN 9. 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 - TEE (6/16) with EF 45-50%, mildly dilated RV with mildly decreased systolic function, PASP 36 mmHg.  - RHC (1/17) with mean RA 9, PA 80/29 mean 52, mean PCWP 27, CI 1.97 Fick/1.8  thermo, PVR 7.6 Fick/8.4 thermo.  - Echo (6/17): EF 60-65%, mild MR, mild RV dilation with moderately decreased systolic function, moderate TR, PASP 85 mmHg.  - Echo (6/18): EF 123456, normal diastolic function, normal RV size and systolic function, PASP 36 mmHg. - Echo (12/18): EF 60-65%, grade II diastolic dysfunction, mild MR, normal RV size and systolic function, PASP 52 mmHg.  - RHC (11/18): mean RA 2, PA 54/16 mean 32, mean PCWP 9, CI 3.83, PVR 3.7 WU.  - Echo (12/19): EF 65-70%, moderate diastolic dysfunction, mild AS, mild MR, PASP 47 mmHg.  10. Pulmonary hypertension: See RHC above.  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  - TEE (6/16) with mildly dilated RV with mildly decreased systolic function. - ANA 0000000, anti-centromere antibody elevated, anti-SCL-70 antibody negative, h/o Raynauds - PFTs (7/16) with minimal obstructive defect but moderately decreased DLCO.  - V/Q scan (2/17) negative for acute or chronic PE.  11. Cardiolite (9/16) with no ischemia/infarction.  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  13. Subclinical hypothyroidism.  14. OSA: Severe on 9/17 sleep study. Now using CPAP.  15. GERD 16. Thyroid nodule: right nodule seen on 3/19 [place].   SH: Married, lives in [place], no smoking, no ETOH.   FH: Adopted.  Daughter has Raynaud's.  ROS: All systems reviewed and negative except as per HPI  Current Outpatient Medications  Medication Sig Dispense Refill  . acetaminophen (TYLENOL) 325 MG tablet Take 2 tablets (650 mg total) by mouth every 4 (four) hours as needed for headache or mild pain.    . Calcium Carb-Cholecalciferol (CALCIUM 600+D) 600-800 MG-UNIT TABS Take 1 tablet by mouth daily.    Marland Kitchen conjugated estrogens (PREMARIN) vaginal cream Place 1 Applicatorful vaginally once a week.    . diazepam (VALIUM) 5 MG tablet TAKE 1 TABLET EVERY 8 HOURS AS NEEDED FOR ANXIETY 30 tablet 2  . ELIQUIS 5 MG TABS tablet TAKE 1 TABLET BY MOUTH TWICE A DAY 180 tablet 1  .  famotidine (PEPCID) 20 MG tablet TAKE 1 TABLET BY MOUTH TWICE A DAY 180 tablet 3  . flecainide (TAMBOCOR) 50 MG tablet Take 1 tablet (50 mg total) by mouth 2 (two) times daily. Please keep upcoming appt with Dr. [name] in November before anymore refills. Thank you 180 tablet 0  . fluticasone (FLONASE) 50 MCG/ACT nasal spray Place 1 spray daily as needed into both nostrils for allergies or rhinitis.    . furosemide (LASIX) 40 MG tablet TAKE 2 TABLETS BY MOUTH EVERY MORNING MAY TAKE 1 TAB IN THE EVENING IF NEEDED 270 tablet 1  . macitentan (OPSUMIT) 10 MG tablet Take 1 tablet (10 mg total) by mouth daily. 30 tablet 11  . metoprolol succinate (TOPROL-XL) 25 MG 24 hr tablet TAKE 1 TABLET BY MOUTH EVERY DAY  90 tablet 3  . Probiotic Product (PROBIOTIC-10) CAPS Take 1 capsule by mouth 3 (three) times a week.     . Riociguat (ADEMPAS) 2.5 MG TABS Take 2.5 mg by mouth 3 (three) times daily. 90 tablet 11  . Selexipag (UPTRAVI) 600 MCG TABS Take 600 mcg by mouth 2 (two) times daily. 180 tablet 2  . potassium chloride (KLOR-CON) 20 MEQ packet Take 40 mEq by mouth daily. 60 packet 5   No current facility-administered medications for this encounter.   BP 110/74   Pulse 61   Wt 68.4 kg (150 lb 12.8 oz)   SpO2 97%   BMI 29.45 kg/m   General: NAD Neck: No JVD, no thyromegaly or thyroid nodule.  Lungs: Clear to auscultation bilaterally with normal respiratory effort. CV: Nondisplaced PMI.  Heart regular S1/S2, no S3/S4, no murmur.  No peripheral edema.  No carotid bruit.  Normal pedal pulses.  Abdomen: Soft, nontender, no hepatosplenomegaly, no distention.  Skin: Intact without lesions or rashes.  Neurologic: Alert and oriented x 3.  Psych: Normal affect. Extremities: No clubbing or cyanosis.  HEENT: Normal.   Assessment/Plan: 1. Pulmonary hypertension: Patient has pulmonary arterial hypertension.  She appears to have co-existing diastolic LV dysfunction given elevated PCWP on prior RHC in 1/17.  PFTs  did not show significant obstruction, they only showed moderately decreased DLCO consistent with pulmonary vascular disease. V/Q scan did not show evidence for chronic PE.  PVR 7.6 WU by Fick and 8.4 WU by thermodiluation on 1/17 RHC.  Cardiac index was low, 1.97 Fick/1.8 thermo. 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  Given worsening symptoms, RHC was repeated in 11/18.  This showed PVR 3.7 WU with mean PA pressure 32 and normal CI.  Most recent echo in 12/19 showed normal RV size and systolic function with PASP 47 mmHg.  Now, she is doing well overall.  NYHA class II symptoms.   - Continue riociguat 2.5 mg tid.  - She has sleep apnea, now using CPAP.  - Continue Opsumit and Selexipag, unable to increase Selexipag beyond 600 mcg bid due to side effects.   - Check BNP today.  - She is due for repeat echo, I will arrange.  - She needs 6 minute walk, does not want to do it in a mask in the office today.  She wants to do the walk at home, she has a measured course.  Her husband will time and she will call in the result to [place].  2. Chronic diastolic CHF: Elevated PCWP on 1/17 RHC.  Last echo in 12/19 with EF 65-70%. NYHA class II symptoms. No volume overload on exam.  - Continue Lasix 80 mg daily.  BMET today.  3. Atrial fibrillation: Paroxysmal.  She is in NSR today.  She had atrial fibrillation ablation in 6/18. She is now on flecainide.  - Continue Toprol XL 25 mg daily.  - Continue apixaban - Continue flecainide.  4. CKD: Stage III. BMET today.  5. Thyroid nodule: She needs thryroid [place] to followup nodule.  I will also check TSH, free T3 and free T4.   6. Depression/anxiety: Now off sertraline and doing ok.      Followup in 3 months  Loralie Champagne 08/08/2019

## 2019-08-08 NOTE — Patient Instructions (Addendum)
NO medication changes today!  Please do 6 minute walk and call into office with results.  Your Pulmonary Hypertension medications have been sent for refills.  Labs today We will only contact you if something comes back abnormal or we need to make some changes. Otherwise no news is good news!  You have been ordered for a Thyroid Ultrasound.  You will get a call to schedule this appointment.   Your physician has requested that you have an echocardiogram. Echocardiography is a painless test that uses sound waves to create images of your heart. It provides your doctor with information about the size and shape of your heart and how well your heart's chambers and valves are working. This procedure takes approximately one hour. There are no restrictions for this procedure.  Your physician recommends that you schedule a follow-up appointment in: 3 months with Dr [name].  Please call office at [phone] option [phone] if you have any questions or concerns.   At the [facility], you and your health needs are our priority. As part of our continuing mission to provide you with exceptional heart care, we have created designated Provider Care Teams. These Care Teams include your primary Cardiologist (physician) and Advanced Practice Providers (APPs- Physician Assistants and Nurse Practitioners) who all work together to provide you with the care you need, when you need it.   You may see any of the following providers on your designated Care Team at your next follow up: Marland Kitchen Dr Glori Bickers . Dr Loralie Champagne . Darrick Grinder, NP . Lyda Jester, PA . Audry Riles, PharmD   Please be sure to bring in all your medications bottles to every appointment.

## 2019-08-08 NOTE — Progress Notes (Signed)
Patient requested liquid form kcl. Ok per MD to dc pill and order.  Packet ordered to be mixed with liquid.

## 2019-08-09 ENCOUNTER — Telehealth (HOSPITAL_COMMUNITY): Payer: Self-pay

## 2019-08-09 DIAGNOSIS — R7989 Other specified abnormal findings of blood chemistry: Secondary | ICD-10-CM

## 2019-08-09 DIAGNOSIS — I5032 Chronic diastolic (congestive) heart failure: Secondary | ICD-10-CM

## 2019-08-09 LAB — T3, FREE: T3, Free: 3.1 pg/mL (ref 2.0–4.4)

## 2019-08-09 MED ORDER — FUROSEMIDE 40 MG PO TABS: ORAL_TABLET | ORAL | 1 refills | Status: DC

## 2019-08-09 NOTE — Telephone Encounter (Signed)
-----   Message from Larey Dresser, MD sent at 08/08/2019  4:17 PM EST ----- 1. Decreased TSH with high free T4, suggestive of hyperthyroidism.  Would refer to Dr. Cruzita Lederer, endocrinology, for evaluation.  2. Higher creatinine, decrease lasix to 60 mg daily.  BMET again in 10 days.

## 2019-08-09 NOTE — Telephone Encounter (Signed)
Pt aware of lab results. Referral placed. Pt will cut lasix to 60mg  and return to office for lab visit on 1/15. Verbalized understanding.

## 2019-08-15 ENCOUNTER — Other Ambulatory Visit (HOSPITAL_COMMUNITY): Payer: Medicare PPO

## 2019-08-17 ENCOUNTER — Other Ambulatory Visit (HOSPITAL_COMMUNITY): Payer: Self-pay

## 2019-08-17 ENCOUNTER — Telehealth (HOSPITAL_COMMUNITY): Payer: Self-pay

## 2019-08-17 ENCOUNTER — Ambulatory Visit (HOSPITAL_BASED_OUTPATIENT_CLINIC_OR_DEPARTMENT_OTHER)
Admission: RE | Admit: 2019-08-17 | Discharge: 2019-08-17 | Disposition: A | Discharge status: Home / Self Care | Payer: Medicare PPO | Source: Ambulatory Visit | Attending: Cardiology | Admitting: Cardiology

## 2019-08-17 ENCOUNTER — Ambulatory Visit (HOSPITAL_COMMUNITY)
Admission: RE | Admit: 2019-08-17 | Discharge: 2019-08-17 | Disposition: A | Discharge status: Home / Self Care | Payer: Medicare PPO | Source: Ambulatory Visit | Attending: Cardiology | Admitting: Cardiology

## 2019-08-17 ENCOUNTER — Ambulatory Visit (HOSPITAL_COMMUNITY)
Admission: RE | Admit: 2019-08-17 | Discharge: 2019-08-17 | Disposition: A | Discharge status: Home / Self Care | Payer: Medicare PPO | Source: Ambulatory Visit | Attending: Internal Medicine | Admitting: Internal Medicine

## 2019-08-17 ENCOUNTER — Other Ambulatory Visit: Payer: Self-pay

## 2019-08-17 DIAGNOSIS — I5032 Chronic diastolic (congestive) heart failure: Secondary | ICD-10-CM | POA: Insufficient documentation

## 2019-08-17 DIAGNOSIS — E042 Nontoxic multinodular goiter: Secondary | ICD-10-CM | POA: Diagnosis not present

## 2019-08-17 DIAGNOSIS — E041 Nontoxic single thyroid nodule: Secondary | ICD-10-CM | POA: Diagnosis not present

## 2019-08-17 LAB — BASIC METABOLIC PANEL
Anion gap: 14 (ref 5–15)
BUN: 31 mg/dL — ABNORMAL HIGH (ref 8–23)
CO2: 23 mmol/L (ref 22–32)
Calcium: 9.8 mg/dL (ref 8.9–10.3)
Chloride: 105 mmol/L (ref 98–111)
Creatinine, Ser: 2.44 mg/dL — ABNORMAL HIGH (ref 0.44–1.00)
GFR calc Af Amer: 22 mL/min — ABNORMAL LOW (ref >60)
GFR calc non Af Amer: 19 mL/min — ABNORMAL LOW (ref >60)
Glucose, Bld: 90 mg/dL (ref 70–99)
Potassium: 3.9 mmol/L (ref 3.5–5.1)
Sodium: 142 mmol/L (ref 135–145)

## 2019-08-17 NOTE — Progress Notes (Signed)
  Echocardiogram 2D Echocardiogram has been performed.  Tiffany [name] Kyrene Longan 08/17/2019, 4:06 PM

## 2019-08-17 NOTE — Telephone Encounter (Signed)
Pt came in today for lab work, pt brought in notes of 6 minute walk at home.  Pt wrote that she walked 860 feet (262 meters) at home. She wrote that she had some SOB but not faint.  Routed to MD

## 2019-08-18 ENCOUNTER — Telehealth (HOSPITAL_COMMUNITY): Payer: Self-pay | Admitting: Pharmacist

## 2019-08-18 ENCOUNTER — Other Ambulatory Visit (HOSPITAL_COMMUNITY): Payer: Medicare PPO

## 2019-08-18 IMAGING — DX DG CHEST 2V
2 series · 2 of 2 positions shown · non-contrast
Comparison: Chest x-ray of July 29, 2016

CLINICAL DATA: Shortness of breath. Known atrial fibrillation,
chronic CHF, pulmonary hypertension, never smoked.

EXAM:
CHEST  2 VIEW

[chest pa]
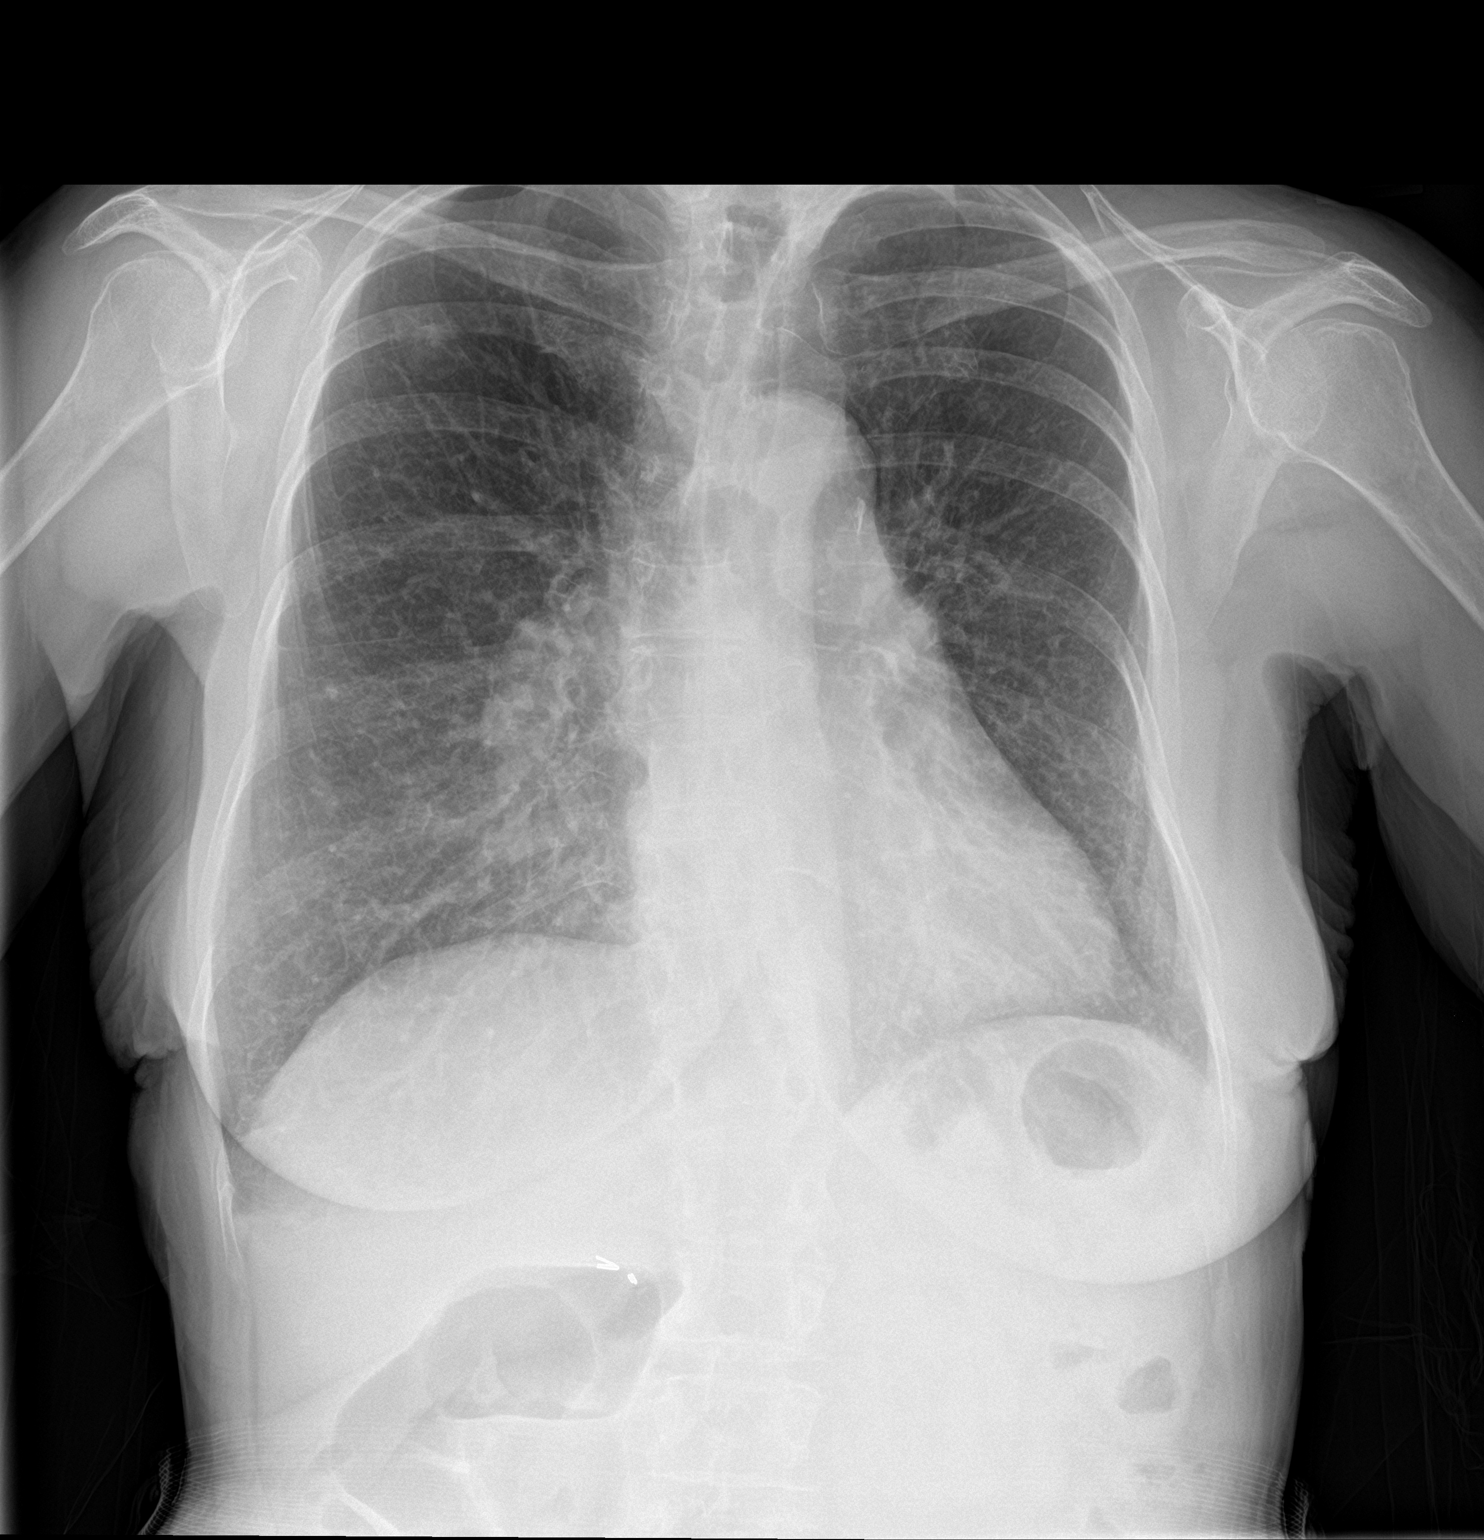

[chest lat]
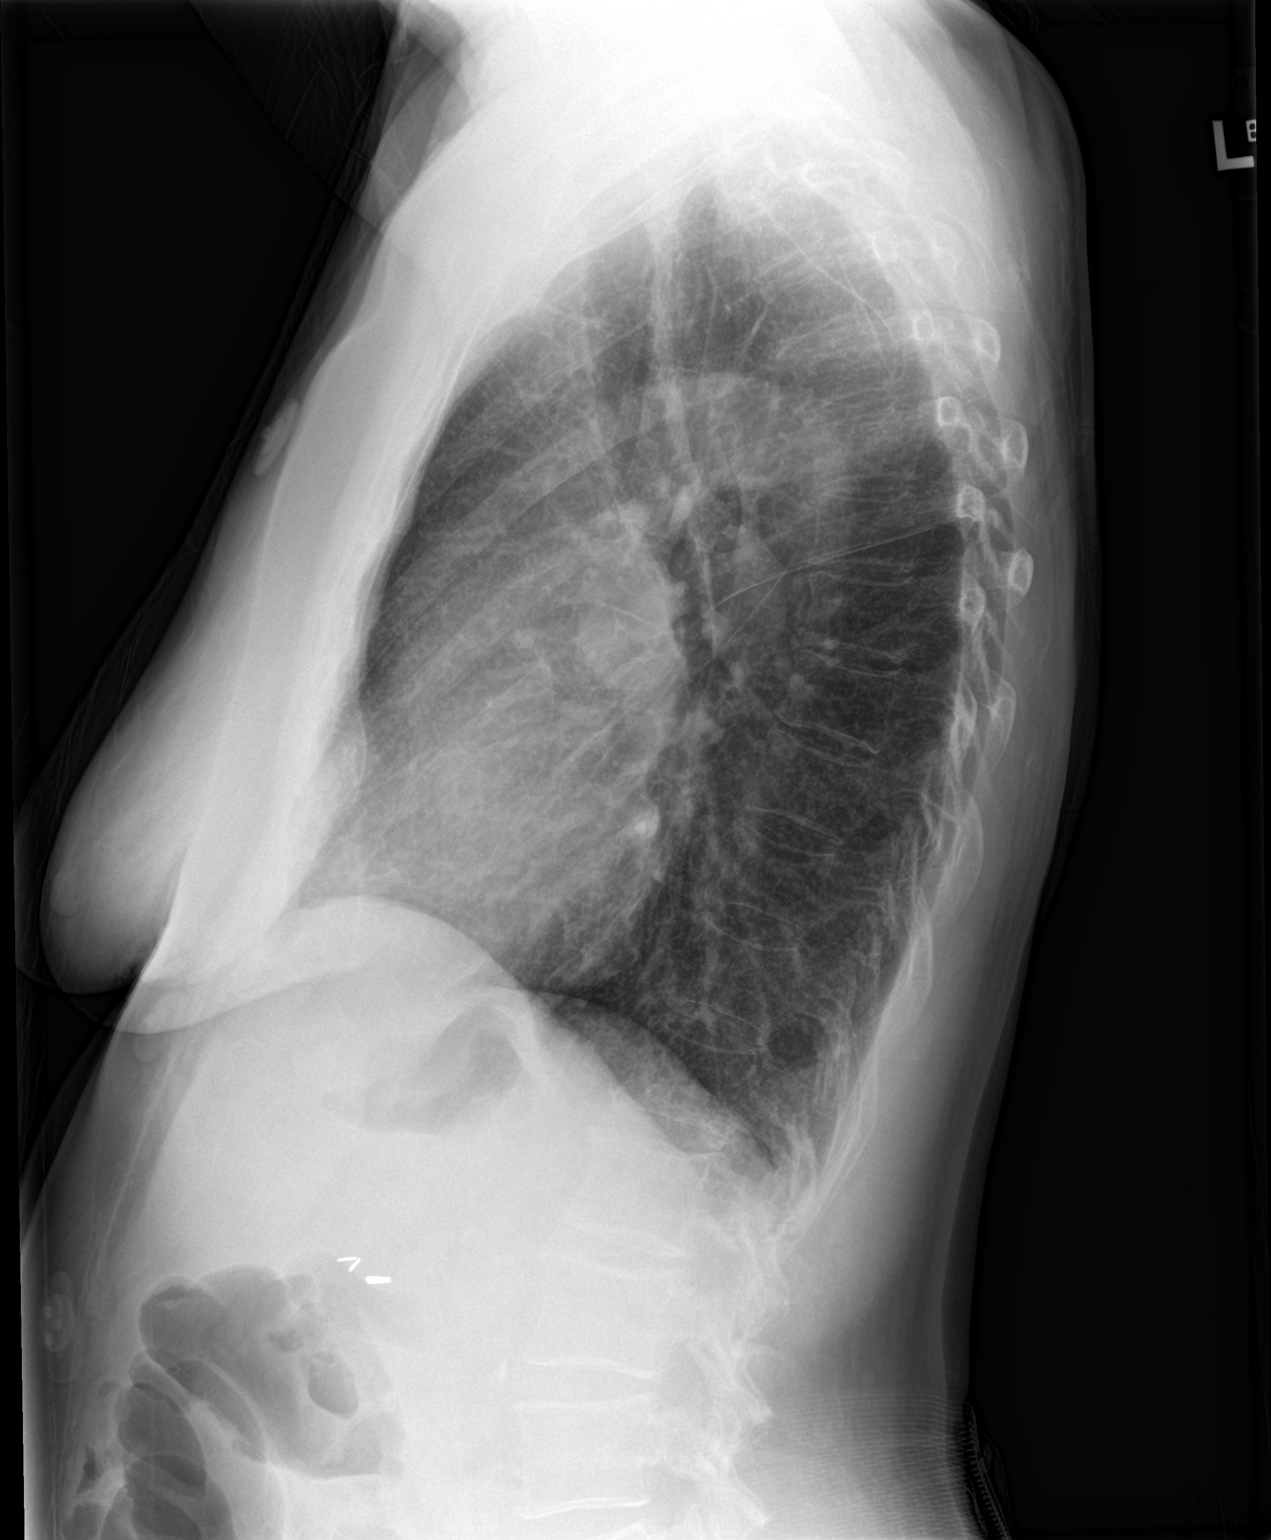

[2 of 2 positions shown; findings below may reference images not displayed]

FINDINGS: The lungs are well-expanded. The interstitial markings are coarse
though stable. There is a stable calcified nodule in the right mid
lung consistent with previous granulomatous disease. Subtle nodular
density in the right upper lobe is more conspicuous today than in
the past. The heart and pulmonary vascularity are normal. There is
calcification in the wall of the aortic arch. There is gentle S
shaped thoracolumbar scoliosis which is stable.
IMPRESSION: Chronic bronchitic changes, stable. Subtle increased density in the
right upper lobe is consistent with a small developing nodule. There
is no alveolar pneumonia. No pulmonary edema. Previous granulomatous
infection.

Chest CT scanning is recommended when the patient can undergo the
procedure to allow further evaluation of the new right upper lobe
nodule.

Thoracic aortic atherosclerosis.

## 2019-08-18 NOTE — Telephone Encounter (Signed)
Called [employer] to initiate prior authorizations for Opsumit, Uptravi and Adempas. [employer] informed me that the prior authorizations completed last year have been extended.  Opsumit: PA good through 08/02/2020 Adempas: PA good through 08/03/2020 Uptravi: PA good through 08/03/2020  Audry Riles, PharmD, BCPS, BCCP, CPP Heart Failure Clinic Pharmacist [phone]

## 2019-08-20 ENCOUNTER — Other Ambulatory Visit (HOSPITAL_COMMUNITY): Payer: Self-pay | Admitting: Cardiology

## 2019-08-20 DIAGNOSIS — I270 Primary pulmonary hypertension: Secondary | ICD-10-CM

## 2019-08-21 ENCOUNTER — Telehealth (HOSPITAL_COMMUNITY): Payer: Self-pay

## 2019-08-21 MED ORDER — UPTRAVI 600 MCG PO TABS: 800.0000 ug | ORAL_TABLET | Freq: Two times a day (BID) | ORAL | 3 refills | Status: DC

## 2019-08-21 NOTE — Telephone Encounter (Signed)
-----   Message from Larey Dresser, MD sent at 08/18/2019  4:19 PM EST ----- Moderate RV dilation with normal RV function.  PA pressure remains significantly elevated.  Would she be willing to try to slowly increase selexipag again? If so, would contact specialty pharmacy and start increasing again.

## 2019-08-21 NOTE — Telephone Encounter (Signed)
   Pt aware of all results. Pt willing to increase Uptravi to 849mcg BID.  Pt did note GI upset with previous increase to 870mcg BID.  Pt however willing to try again.  [facility] called and med increased to 88mcg BID.  They will contact patient regarding shipment of new medicine.  She will update office to let us know how she is tolerating new dose.

## 2019-08-21 NOTE — Telephone Encounter (Signed)
-----   Message from Larey Dresser, MD sent at 08/18/2019  4:08 PM EST ----- Nodule not worrisome, does not have to have followup.

## 2019-08-21 NOTE — Telephone Encounter (Signed)
-----   Message from Larey Dresser, MD sent at 08/18/2019  4:22 PM EST ----- Mildly lower creatinine

## 2019-09-05 ENCOUNTER — Other Ambulatory Visit: Payer: Self-pay

## 2019-09-05 ENCOUNTER — Encounter: Payer: Self-pay | Admitting: Cardiovascular Disease

## 2019-09-05 ENCOUNTER — Ambulatory Visit: Payer: Medicare PPO | Admitting: Cardiovascular Disease

## 2019-09-05 VITALS — BP 115/66 | HR 69 | Ht 60.0 in | Wt 148.0 lb

## 2019-09-05 DIAGNOSIS — I272 Pulmonary hypertension, unspecified: Secondary | ICD-10-CM

## 2019-09-05 DIAGNOSIS — G4733 Obstructive sleep apnea (adult) (pediatric): Secondary | ICD-10-CM

## 2019-09-05 DIAGNOSIS — I5032 Chronic diastolic (congestive) heart failure: Secondary | ICD-10-CM

## 2019-09-05 DIAGNOSIS — I480 Paroxysmal atrial fibrillation: Secondary | ICD-10-CM | POA: Diagnosis not present

## 2019-09-05 NOTE — Patient Instructions (Signed)
Medication Instructions:  CONTINUE WITH CURRENT MEDICATIONS *If you need a refill on your cardiac medications before your next appointment, please call your pharmacy*  Follow-Up: At [facility], you and your health needs are our priority.  As part of our continuing mission to provide you with exceptional heart care, we have created designated Provider Care Teams.  These Care Teams include your primary Cardiologist (physician) and Advanced Practice Providers (APPs -  Physician Assistants and Nurse Practitioners) who all work together to provide you with the care you need, when you need it.  Your next appointment:   12 month(s)  The format for your next appointment:   In Person  Provider:   Shelva Majestic, MD

## 2019-09-05 NOTE — Progress Notes (Signed)
Cardiology Office Note    Date:  09/06/2019   ID:  Tiffany [name], DOB [birth year], MRN [id number]  PCP:  Tiffany Koch, MD  Cardiologist:  Tiffany Majestic, MD (sleep); Dr. Aundra [name]  F/U sleep evaluation  History of Present Illness:  Tiffany [name] is a 74 y.o. female who presents for one-year follow-up sleep evaluation.   She is followed by Dr. Aundra [name].  Tiffany [name] has a history of paroxysmal atrial fibrillation and diastolic heart failure and was found to have pulmonary hypertension.  She admits to dyspnea.  She had developed an episode of atrial fibrillation and underwent cardioversion in June 2016.  She was started on amiodarone but this was stopped secondary to dyspnea.  A nuclear perfusion study in September 2016 did not reveal ischemia or infarction.  She was found to have elevated left and right heart filling pressures as well as pulmonary hypertension at catheterization and her PA pressure was increased up to 85 mm on echo.  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  Because of her pulmonary hypertension, she was referred for a sleep study which was done on 04/10/2016.  She met split-night criteria and was found to have severe obstructive sleep apnea with an HIF 59.4 per hour on the diagnostic study.  She did not have evidence for central apneic events.  Oxygen desaturated to a nadir of 80%.  There was moderate snoring.  She underwent CPAP titration and a 13 cm water pressure was recommended.   CPAP was initiated on 05/18/2016.  When I saw her for initial evaluation, a download obtained from 06/13/2016 through 07/12/2016 demonstrated 100% compliance abuse and usage greater than 4 hours. She was averaging 9 hours and 13 minutes of sleep per night.  At 13 cmwater pressure, AHI was  2.5.   Over the past year, she has continued to use CPAP with 1% compliance.  A new download was obtained from 06/21/2017 through 07/20/2017.  This confirms 100% compliance.   She is averaging 9 hours and 26 minutes of sleep per night.  At her 13 cm water pressure, AHI is excellent at 2.7.  There is no significant leak.  She is sleeping well.  She denies any daytime sleepiness.  She is unaware of any breakthrough snoring.  In the office today.  I recalculated an Epworth Sleepiness Scale score, and this was excellent and endorsed at 1.   She saw Dr. Aundra [name] advanced heart failure [name] in November 2018.  She has a history of atrial fibrillation and pulmonary hypertension with diastolic heart failure.  She had undergone a right heart catheterization.  Her pulmonary pressures have significant improved with CPAP treatment.   I last saw her in December 2018 at which time she was doing well from a sleep perspective.  Compliance was excellent.  AHI was excellent at 2.7.  Over the past year, she has done well from a cardiac perspective.  An echo Doppler study in December 2019 showed an EF of 65 to 70% with moderate diastolic dysfunction, mild left ear, mild MR, PASP 47 mm and normal RV.  A  download was obtained in the office today from August 02, 2018 through August 31, 2018.  Compliance was excellent at 100%.  She was averaging 9 hours and 7 minutes of CPAP use per night.  At a set pressure of 13 cm, AHI 0.9.  There is no leak.  She was unaware of any breakthrough snoring.  Her  sleep is restorative.  She denied residual daytime sleepiness.  At that evaluation she had noticed some occasional palpitations and was planning a follow-up visit with Tiffany [name]. Tiffany Cookey, PA in atrial fibrillation [name].  Over the past year, she has continued to be followed by Dr. Aundra [name] for her diastolic heart failure and pulmonary hypertension.  She denied dyspnea while walking on flat ground.  An echo in December 2019 showed an EF of 65 to 70%.  Her most recent echo from August 17, 2019 reveals an EF of 60 to 65%, there was mild LA dilatation, mild to moderate RA dilatation, mild MR, mild to moderate TR with  mild aortic valve sclerosis without stenosis.  There was evidence for right ventricular pressure overload.  There was severe pulmonary hypertension with estimated RV systolic pressure at 97.0 mmHg. She had class II symptoms.  She was maintaining sinus rhythm on flecainide and she has been maintained on Opsumit and selexipag.  From a sleep perspective, she has continued to use CPAP with excellent compliance.  A new download was obtained from August 06, 2019 through September 04, 2019.  Usage is excellent and she is averaging 9 hours and 13 minutes of CPAP use per night.  At her 13 cm water pressure, AHI is 1.4/h.  She has been using nasal pillow mask and tolerates this well with hardly any leak.  She denies any significant residual daytime sleepiness.  She presents for evaluation.  Past Medical History:  Diagnosis Date  . Arthritis 04-20-12   Osteoarthritis-right hip  . Atrial fibrillation status post cardioversion, 01/30/15 maintaining SR.  01/31/2015   a. 01/2015 s/p TEE/DCCV;  b. 02/06/2015 recurrent AF noted, amio added;  c. CHA2DS2VASc = 3-->eliquis.  Marland Kitchen BENIGN POSITIONAL VERTIGO   . Cataract   . Complication of anesthesia 04-20-12   some issues with prolonged sedation after anesthesia  . Diverticulosis   . DYSLIPIDEMIA   . Esophageal stricture   . GERD   . HYPERTENSION    a. severe, pt intol of med tx, Pt. has severe "whitecoat" syndrome and refused medical therapy.  . Hypothyroidism 09/29/2014   a. pt did not tolerate synthroid and this was subsequently discontinued.  . Mild LV dysfunction    a. 01/2015 Echo: EF 45-50%, mild MR, mildly dil LA, mod dil RV with mod to sev reduced fxn, mod-sev TR, PASP 51mHg.  . Osteopenia   . Raynaud disease   . URINARY INCONTINENCE 04-20-12   occ. with nighttime sleep pattern    Past Surgical History:  Procedure Laterality Date  . ATRIAL FIBRILLATION ABLATION N/A 01/26/2017   Procedure: Atrial Fibrillation Ablation;  Surgeon: AThompson Grayer MD;  [facility];  Service: Cardiovascular;  Laterality: N/A;  . breast ultrasound Right 09/13/13   There is no sonographic evidence of malignancy. the 02.6VZcomplicated cyst in (R) breast is consistent with a benign finding. repeat in 1 year  . CARDIAC CATHETERIZATION N/A 08/29/2015   Procedure: Right Heart Cath;  Surgeon: DLarey Dresser MD;  [facility];  Service: Cardiovascular;  Laterality: N/A;  . CARDIOVERSION N/A 01/30/2015   Procedure: CARDIOVERSION;  Surgeon: MSanda Klein MD;  Location: [facility];  Service: Cardiovascular;  Laterality: N/A;  . CHOLECYSTECTOMY     '90-"sludge"  . NASAL FRACTURE SURGERY  2006  . RIGHT HEART CATH N/A 06/30/2017   Procedure: RIGHT HEART CATH;  Surgeon: MLarey Dresser MD;  [facility];  Service: Cardiovascular;  Laterality: N/A;  .  TEE WITHOUT CARDIOVERSION N/A 01/30/2015   Procedure: TRANSESOPHAGEAL ECHOCARDIOGRAM (TEE);  Surgeon: Sanda Klein, MD;  Location: [facility] ENDOSCOPY;  Service: Cardiovascular;  Laterality: N/A;  . TEE WITHOUT CARDIOVERSION N/A 01/25/2017   Procedure: TRANSESOPHAGEAL ECHOCARDIOGRAM (TEE);  Surgeon: Fay Records, MD;  Location: [facility] ENDOSCOPY;  Service: Cardiovascular;  Laterality: N/A;  . TOTAL HIP ARTHROPLASTY  04/26/2012   Procedure: TOTAL HIP ARTHROPLASTY ANTERIOR APPROACH;  Surgeon: Mauri Pole, MD;  Location: [facility];  Service: Orthopedics;  Laterality: Right;  . TUBAL LIGATION  1980    Current Medications: Outpatient Medications Prior to Visit  Medication Sig Dispense Refill  . acetaminophen (TYLENOL) 325 MG tablet Take 2 tablets (650 mg total) by mouth every 4 (four) hours as needed for headache or mild pain.    . Calcium Carb-Cholecalciferol (CALCIUM 600+D) 600-800 MG-UNIT TABS Take 1 tablet by mouth daily.    Marland Kitchen conjugated estrogens (PREMARIN) vaginal cream Place 1 Applicatorful vaginally once a week.    . diazepam (VALIUM) 5 MG tablet TAKE 1 TABLET EVERY 8 HOURS AS NEEDED FOR  ANXIETY 30 tablet 2  . ELIQUIS 5 MG TABS tablet TAKE 1 TABLET BY MOUTH TWICE A DAY 180 tablet 1  . famotidine (PEPCID) 20 MG tablet TAKE 1 TABLET BY MOUTH TWICE A DAY 180 tablet 3  . flecainide (TAMBOCOR) 50 MG tablet Take 1 tablet (50 mg total) by mouth 2 (two) times daily. Please keep upcoming appt with Dr. [name] in November before anymore refills. Thank you 180 tablet 0  . fluticasone (FLONASE) 50 MCG/ACT nasal spray Place 1 spray daily as needed into both nostrils for allergies or rhinitis.    . furosemide (LASIX) 40 MG tablet TAKE 1.5 TABLETS BY MOUTH EVERY MORNING MAY TAKE 1 TAB IN THE EVENING IF NEEDED 270 tablet 1  . metoprolol succinate (TOPROL-XL) 25 MG 24 hr tablet TAKE 1 TABLET BY MOUTH EVERY DAY 90 tablet 3  . OPSUMIT 10 MG tablet TAKE 1 TABLET BY MOUTH EVERY DAY 30 tablet 11  . potassium chloride (KLOR-CON) 20 MEQ packet Take 40 mEq by mouth daily. 60 packet 5  . Probiotic Product (PROBIOTIC-10) CAPS Take 1 capsule by mouth 3 (three) times a week.     . Riociguat (ADEMPAS) 2.5 MG TABS Take 2.5 mg by mouth 3 (three) times daily. 90 tablet 11  . Selexipag (UPTRAVI) 600 MCG TABS Take 800 mcg by mouth 2 (two) times daily. 60 tablet 3   No facility-administered medications prior to visit.     Allergies:   Levothyroxine, Statins, Prednisone, and Penicillins   Social History   Socioeconomic History  . Marital status: Married    Spouse name: Not on file  . Number of children: 2  . Years of education: Not on file  . Highest education level: Not on file  Occupational History  . Not on file  Tobacco Use  . Smoking status: Never Smoker  . Smokeless tobacco: Never Used  Substance and Sexual Activity  . Alcohol use: Yes    Alcohol/week: 2.0 standard drinks    Types: 2 Glasses of wine per week    Comment: several glasses wine weekly  . Drug use: No  . Sexual activity: Yes  Other Topics Concern  . Not on file  Social History Narrative   She enjoys birding.  Lives at home with  husband.   Married.   retired Quarry manager, Tax inspector   Social Determinants of Radio broadcast assistant Strain:   .  Difficulty of Paying Living Expenses: Not on file  Food Insecurity:   . Worried About Charity fundraiser in the Last Year: Not on file  . Ran Out of Food in the Last Year: Not on file  Transportation Needs:   . Lack of Transportation (Medical): Not on file  . Lack of Transportation (Non-Medical): Not on file  Physical Activity:   . Days of Exercise per Week: Not on file  . Minutes of Exercise per Session: Not on file  Stress:   . Feeling of Stress : Not on file  Social Connections:   . Frequency of Communication with Friends and Family: Not on file  . Frequency of Social Gatherings with Friends and Family: Not on file  . Attends Religious Services: Not on file  . Active Member of Clubs or Organizations: Not on file  . Attends Archivist Meetings: Not on file  . Marital Status: Not on file     Family History:  The patient's family history includes Other in an other family member. She was adopted.   ROS General: Negative; No fevers, chills, or night sweats;  HEENT: Negative; No changes in vision or hearing, sinus congestion, difficulty swallowing Pulmonary: Negative; No cough, wheezing, shortness of breath, hemoptysis Cardiovascular: History of diastolic heart failure, pulmonary hypertension GI: Negative; No nausea, vomiting, diarrhea, or abdominal pain GU: Negative; No dysuria, hematuria, or difficulty voiding Musculoskeletal: Negative; no myalgias, joint pain, or weakness Hematologic/Oncology: Negative; no easy bruising, bleeding Endocrine: Negative; no heat/cold intolerance; no diabetes Neuro: Negative; no changes in balance, headaches Skin: Negative; No rashes or skin lesions Psychiatric: Negative; No behavioral problems, depression Sleep: Positive for severe sleep apnea.  Since initiating CPAP therapy there is no  residual snoring, daytime sleepiness, hypersomnolence, bruxism, restless legs, hypnogognic hallucinations, no cataplexy Other comprehensive 14 point system review is negative.   PHYSICAL EXAM:   VS:  BP 115/66   Pulse 69   Ht 5' (1.524 m)   Wt 148 lb (67.1 kg)   SpO2 95%   BMI 28.90 kg/m     Repeat blood pressure by me was excellent at 106/60.  Wt Readings from Last 3 Encounters:  09/05/19 148 lb (67.1 kg)  08/08/19 150 lb 12.8 oz (68.4 kg)  05/31/19 144 lb (65.3 kg)    General: Alert, oriented, no distress.  Skin: normal turgor, no rashes, warm and dry HEENT: Normocephalic, atraumatic. Pupils equal round and reactive to light; sclera anicteric; extraocular muscles intact;  Nose without nasal septal hypertrophy Mouth/Parynx benign; Mallinpatti scale 3 Neck: No JVD, no carotid bruits; normal carotid upstroke Lungs: clear to ausculatation and percussion; no wheezing or rales Chest wall: without tenderness to palpitation Heart: PMI not displaced, RRR, s1 s2 normal, 1/6 systolic murmur, no diastolic murmur, no rubs, gallops, thrills, or heaves Abdomen: soft, nontender; no hepatosplenomehaly, BS+; abdominal aorta nontender and not dilated by palpation. Back: no CVA tenderness Pulses 2+ Musculoskeletal: full range of motion, normal strength, no joint deformities Extremities: no clubbing cyanosis or edema, Homan's sign negative  Neurologic: grossly nonfocal; Cranial nerves grossly wnl Psychologic: Normal mood and affect   Studies/Labs Reviewed:   ECG (independently read by me): Normal sinus rhythm at 69 bpm.  Incomplete right bundle branch block.  QS complex V1 V2.  Mild T wave abnormality.  QTc interval 465 msec  January 2020 ECG (independently read by me): Sinus rhythm at 79 bpm with frequent PACs, possible left atrial enlargement.  QS V1 V2   An independent  review of her last ECG from 08/08/2015 reveals normal sinus rhythm at 70 bpm.  There is left axis deviation and  incomplete right bundle branch block.  Recent Labs: BMP Latest Ref Rng & Units 08/17/2019 08/08/2019 04/19/2019  Glucose 70 - 99 mg/dL 90 94 93  BUN 8 - 23 mg/dL 31(H) 43(H) 34(H)  Creatinine 0.44 - 1.00 mg/dL 2.44(H) 2.58(H) 2.11(H)  BUN/Creat Ratio 12 - 28 - - -  Sodium 135 - 145 mmol/L 142 138 140  Potassium 3.5 - 5.1 mmol/L 3.9 4.0 3.7  Chloride 98 - 111 mmol/L 105 104 104  CO2 22 - 32 mmol/L _0 Calcium 8.9 - 10.3 mg/dL 9.8 9.3 9.7     Hepatic Function Latest Ref Rng & Units 04/19/2019 09/01/2018 10/08/2017  Total Protein 6.0 - 8.3 g/dL 7.6 7.6 7.2  Albumin 3.5 - 5.2 g/dL 4.3 4.0 4.0  AST 0 - 37 U/L _1 ALT 0 - 35 U/L _2 Alk Phosphatase 39 - 117 U/L 54 55 59  Total Bilirubin 0.2 - 1.2 mg/dL 0.8 0.5 0.6  Bilirubin, Direct 0.0 - 0.3 mg/dL - - -    CBC Latest Ref Rng & Units 08/08/2019 04/19/2019 09/01/2018  WBC 4.0 - 10.5 K/uL 4.9 5.7 6.7  Hemoglobin 12.0 - 15.0 g/dL 11.5(L) 12.8 12.2  Hematocrit 36.0 - 46.0 % 37.2 38.3 36.4  Platelets 150 - 400 K/uL 201 193.0 288.0   Lab Results  Component Value Date   MCV 92.1 08/08/2019   MCV 87.0 04/19/2019   MCV 85.6 09/01/2018   Lab Results  Component Value Date   TSH 0.189 (L) 08/08/2019   Lab Results  Component Value Date   HGBA1C 5.2 04/19/2019     BNP    Component Value Date/Time   BNP 179.1 (H) 07/20/2018 1140    ProBNP    Component Value Date/Time   PROBNP 108.0 (H) 09/01/2018 1100     Lipid Panel     Component Value Date/Time   CHOL 188 04/19/2019 1229   TRIG 163.0 (H) 04/19/2019 1229   TRIG 185 02/11/2009 0000   HDL 49.70 04/19/2019 1229   CHOLHDL 4 04/19/2019 1229   VLDL 32.6 04/19/2019 1229   LDLCALC 105 (H) 04/19/2019 1229     RADIOLOGY: ECHOCARDIOGRAM COMPLETE  Result Date: 08/17/2019   ECHOCARDIOGRAM REPORT   Patient Name:   Tiffany [name] Date of Exam: 08/17/2019 Medical Rec #:  [id number]      Height:       60.0 in Accession #:    [id number]     Weight:       150.8 lb Date of  Birth:  August 08, 1945      BSA:          1.66 m Patient Age:    33 years       BP:           111/71 mmHg Patient Gender: F              HR:           61 bpm. Exam Location:  Outpatient Procedure: 2D Echo, Cardiac Doppler and Color Doppler Indications:    H15.05 Chronic diastolic (congestive) heart failure  History:        Patient has prior history of Echocardiogram examinations, most                 recent 07/20/2018. Arrythmias:Atrial Fibrillation; Risk  Factors:Hypertension and Dyslipidemia. Hypothyroidism. GERD.  Sonographer:    Jonelle Sidle Dance Referring Phys: [place]  1. Left ventricular ejection fraction, by visual estimation, is 60 to 65%. The left ventricle has normal function. There is no left ventricular hypertrophy.  2. Left ventricular diastolic function could not be evaluated.  3. Right ventricular pressure overload.  4. The left ventricle has no regional wall motion abnormalities.  5. Global right ventricle has normal systolic function.The right ventricular size is moderately enlarged. No increase in right ventricular wall thickness.  6. Left atrial size was mildly dilated.  7. Right atrial size was mild-moderately dilated.  8. Mild mitral annular calcification.  9. The mitral valve is degenerative. Mild mitral valve regurgitation. 10. The tricuspid valve is normal in structure. Mild to moderate tricuspid regurgitation. 11. The aortic valve is tricuspid. Aortic valve regurgitation is mild. Mild aortic valve sclerosis without stenosis. 12. Severely elevated pulmonary artery systolic pressure. 13. The tricuspid regurgitant velocity is 4.62 m/s, and with an assumed right atrial pressure of 3 mmHg, the estimated right ventricular systolic pressure is severely elevated at 88.2 mmHg. 14. The inferior vena cava is normal in size with greater than 50% respiratory variability, suggesting right atrial pressure of 3 mmHg. In comparison to the previous echocardiogram(s):  07/20/2018. LVEF unchanged. PA pressures now severe. RV moderately dilated compared with prior, but function preserved. FINDINGS  Left Ventricle: Left ventricular ejection fraction, by visual estimation, is 60 to 65%. The left ventricle has normal function. The left ventricle has no regional wall motion abnormalities. The left ventricular internal cavity size was the left ventricle is normal in size. There is no left ventricular hypertrophy. The interventricular septum is flattened in systole, consistent with right ventricular pressure overload. The left ventricular diastology could not be evaluated due to abnormal septal  motion. Left ventricular diastolic function could not be evaluated. Right Ventricle: The right ventricular size is moderately enlarged. No increase in right ventricular wall thickness. Global RV systolic function is has normal systolic function. The tricuspid regurgitant velocity is 4.62 m/s, and with an assumed right atrial pressure of 3 mmHg, the estimated right ventricular systolic pressure is severely elevated at 88.2 mmHg. Left Atrium: Left atrial size was mildly dilated. Right Atrium: Right atrial size was mild-moderately dilated Pericardium: There is no evidence of pericardial effusion. Mitral Valve: The mitral valve is degenerative in appearance. Mild mitral annular calcification. Mild mitral valve regurgitation. Tricuspid Valve: The tricuspid valve is normal in structure. Tricuspid valve regurgitation mild-moderate. Aortic Valve: The aortic valve is tricuspid. Aortic valve regurgitation is mild. Mild aortic valve sclerosis is present, with no evidence of aortic valve stenosis. Mild aortic valve annular calcification. Pulmonic Valve: The pulmonic valve was grossly normal. Pulmonic valve regurgitation is mild. Pulmonic regurgitation is mild. Aorta: The aortic root and ascending aorta are structurally normal, with no evidence of dilitation. Venous: The inferior vena cava is normal in size  with greater than 50% respiratory variability, suggesting right atrial pressure of 3 mmHg. IAS/Shunts: No atrial level shunt detected by color flow Doppler.  LEFT VENTRICLE PLAX 2D LVIDd:         4.40 cm  Diastology LVIDs:         2.40 cm  LV e' lateral:   12.00 cm/s LV PW:         0.80 cm  LV E/e' lateral: 10.9 LV IVS:        0.90 cm  LV e' medial:    6.96 cm/s  LVOT diam:     1.80 cm  LV E/e' medial:  18.8 LV SV:         68 ml LV SV Index:   39.11 LVOT Area:     2.54 cm  RIGHT VENTRICLE             IVC RV Basal diam:  2.80 cm     IVC diam: 1.40 cm RV S prime:     11.30 cm/s TAPSE (M-mode): 2.5 cm LEFT ATRIUM             Index       RIGHT ATRIUM           Index LA diam:        3.90 cm 2.36 cm/m  RA Area:     21.10 cm LA Vol (A2C):   72.5 ml 43.79 ml/m RA Volume:   61.70 ml  37.27 ml/m LA Vol (A4C):   49.3 ml 29.78 ml/m LA Biplane Vol: 60.2 ml 36.36 ml/m  AORTIC VALVE LVOT Vmax:   140.00 cm/s LVOT Vmean:  88.300 cm/s LVOT VTI:    0.334 m  AORTA Ao Root diam: 3.10 cm Ao Asc diam:  3.50 cm MITRAL VALVE                         TRICUSPID VALVE MV Area (PHT): 3.42 cm              TR Peak grad:   85.2 mmHg MV PHT:        64.38 msec            TR Vmax:        471.00 cm/s MV Decel Time: 222 msec MV E velocity: 131.00 cm/s 103 cm/s  SHUNTS MV A velocity: 77.10 cm/s  70.3 cm/s Systemic VTI:  0.33 m MV E/A ratio:  1.70        1.5       Systemic Diam: 1.80 cm  Eleonore Chiquito MD Electronically signed by Eleonore Chiquito MD Signature Date/Time: 08/17/2019/4:41:03 PM    Final    US THYROID  Result Date: 08/18/2019 CLINICAL DATA:  Follow-up right thyroid nodule. EXAM: THYROID ULTRASOUND TECHNIQUE: Ultrasound examination of the thyroid gland and adjacent soft tissues was performed. COMPARISON:  10/25/2017 FINDINGS: Parenchymal Echotexture: Moderately heterogenous Isthmus: 0.5 cm, previously 0.3 cm Right lobe: 4.8 x 1.5 x 1.5 cm, previously 5.0 x 1.9 x 1.6 cm Left lobe: 4.1 x 2.0 x 1.3 cm, previously 4.2 x 2.5 x 1.3 cm  _________________________________________________________ Estimated total number of nodules >/= 1 cm: 0 Number of spongiform nodules >/=  2 cm not described below (TR1): 0 Number of mixed cystic and solid nodules >/= 1.5 cm not described below (TR2): 0 _________________________________________________________ Again noted are numerous cysts and cystic nodules throughout the thyroid. Nodule # 2: Prior biopsy: No Location: Right; Inferior Maximum size: 0.9 cm; Other 2 dimensions: 0.5 x 0.6 cm, previously, 1.0 x 0.6 x 0.9 cm Composition: mixed cystic and solid (1) Echogenicity: isoechoic (1) Shape: not taller-than-wide (0) Margins: ill-defined (0) Echogenic foci: none (0) ACR TI-RADS total points: 2. ACR TI-RADS risk category:  TR2 (2 points). Significant change in size (>/= 20% in two dimensions and minimal increase of 2 mm): No Change in features: Yes; nodule now appears to be mixed cystic and solid composition rather than a solid nodule. Change in ACR TI-RADS risk category: Yes ACR TI-RADS recommendations: This nodule does NOT meet TI-RADS criteria  for biopsy or dedicated follow-up. _________________________________________________________ IMPRESSION: Thyroid tissue is heterogeneous with several small thyroid nodules and cysts. Dominant nodule is located in the inferior right thyroid lobe. This nodule has mixed cystic and solid composition and measures up to 0.9 cm. Nodule does not meet criteria for biopsy or dedicated follow-up. The above is in keeping with the ACR TI-RADS recommendations - J Am Coll Radiol 2017;14:587-595. Electronically Signed   By: Markus Daft M.D.   On: 08/18/2019 08:28     Additional studies/ records that were reviewed today include:  I review the subsequent office records of Dr. Aundra [name], the patient's sleep study, ECG, and obtained a new download    ASSESSMENT:    1. OSA (obstructive sleep apnea)   2. Chronic diastolic CHF (congestive heart failure) (HCC)   3. Pulmonary hypertension  (HCC)   4. Paroxysmal atrial fibrillation [facility]): Status post remote cardioversion, maintaining sinus rhythm on flecainide     PLAN:  Ms. Jamayah Myszka is a very pleasant 74 year old female who has a history of paroxysmal atrial fibrillation and diastolic heart failure and was found to have significant pulmonary hypertension.  Her initial sleep study in September 2017 revealed severe sleep apnea with an AHI of 59.4/h and significant oxygen desaturation to a nadir of 80%.  She had reduced sleep efficiency and moderate snoring.  She has been on CPAP therapy at 13 cm since 2017 over the last several years has consistently demonstrated excellent compliance.  Her most recent download from January 3 through September 04, 2019 shows an average sleep duration at 9 hours and 13 minutes.  AHI is 1.4 at a 13 cm set pressure.  She is tolerated nasal pillows.  She does not have any significant leak.  I discussed potential future significant mask alternatives which have become available since 2013 but since she is doing well with her current mask we will continue current therapy.  Her ECG today confirms that she is maintaining sinus rhythm on flecainide.  QTc interval is 465 ms.  She is being followed closely by Dr. Aundra [name] with her severe pulmonary hypertension and most recent echo Doppler study has shown an estimated RV systolic pressure at 37.3 mmHg.  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  Remote PVR was 7.6 WU in 2017.  She has recently been evaluated also by Dr. [name] who felt her PAF was well controlled with flecainide.  She will continue current therapy as prescribed.  Per Medicare guidelines, I will see her in 1 year for face-to-face sleep evaluation.   Medication Adjustments/Labs and Tests Ordered: Current medicines are reviewed at length with the patient today.  Concerns regarding medicines are outlined above.  Medication changes,  Labs and Tests ordered today are listed in the Patient Instructions below.  Patient Instructions  Medication Instructions:  CONTINUE WITH CURRENT MEDICATIONS *If you need a refill on your cardiac medications before your next appointment, please call your pharmacy*  Follow-Up: At [facility], you and your health needs are our priority.  As part of our continuing mission to provide you with exceptional heart care, we have created designated Provider Care Teams.  These Care Teams include your primary Cardiologist (physician) and Advanced Practice Providers (APPs -  Physician Assistants and Nurse Practitioners) who all work together to provide you with the care you need, when you need it.  Your next appointment:   12 month(s)  The format for your next  appointment:   In Person  Provider:   Shelva Majestic, MD    Time spent: 30 minutes Signed, Tiffany Majestic, MD  09/06/2019 [facility] [address], [place], [place], [place]  [address] Phone: [phone]       Cardiology Office Note    Date:  09/06/2019   ID:  Tiffany [name], Tiffany [name] Feb 28, 1946, MRN [id number]  PCP:  Tiffany Koch, MD  Cardiologist:  Tiffany Majestic, MD (sleep); Dr. Aundra [name]  New sleep evaluation  History of Present Illness:  DIJON KOHLMAN is a 74 y.o. female who presents for follow-up sleep evaluation.  I last saw her in December 2018.   Ms. withdrew has a history of paroxysmal atrial fibrillation and diastolic heart failure and was found to have pulmonary hypertension.  She admits to dyspnea.  She had developed an episode of atrial fibrillation and underwent cardioversion in June 2016.  She was started on amiodarone but this was stopped secondary to dyspnea.  A nuclear perfusion study in September 2016 did not reveal ischemia or infarction.  She was found to have elevated left and right heart filling pressures as well as pulmonary hypertension at catheterization and her PA  pressure was increased up to 85 mm on echo.  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  Because of her pulmonary hypertension, she was referred for a sleep study which was done on 04/10/2016.  She met split-night criteria and was found to have severe obstructive sleep apnea with an HIF 59.4 per hour on the diagnostic study.  She did not have evidence for central apneic events.  Oxygen desaturated to a nadir of 80%.  There was moderate snoring.  She underwent CPAP titration and a 13 cm water pressure was recommended.  Her CPAP was set up following her CPAP titration.  A download obtained from 06/13/2016 through 07/12/2016 shows 100% compliance abuse and usage greater than 4 hours.  She is averaging 9 hours and 13 minutes of sleep per night.  At 13.  Senna meter water pressure, AHI is 2.5.  She is sleeping well.  She is unaware of breakthrough snoring.  She denies frequent awakenings.  There is no daytime sleepiness.  In a form sleepiness scale was calculated and endorsed at 2 shown below.  Epworth Sleepiness Scale: Situation   Chance of Dozing/Sleeping (0 = never , 1 = slight chance , 2 = moderate chance , 3 = high chance )   sitting and reading 1   watching TV 0   sitting inactive in a public place 0   being a passenger in a motor vehicle for an hour or more 0   lying down in the afternoon 1   sitting and talking to someone 0   sitting quietly after lunch (no alcohol) 0   while stopped for a few minutes in traffic as the driver 0   Total Score  2      Past Medical History:  Diagnosis Date  . Arthritis 04-20-12   Osteoarthritis-right hip  . Atrial fibrillation status post cardioversion, 01/30/15 maintaining SR.  01/31/2015   a. 01/2015 s/p TEE/DCCV;  b. 02/06/2015 recurrent AF noted, amio added;  c. CHA2DS2VASc = 3-->eliquis.  Marland Kitchen BENIGN POSITIONAL VERTIGO   . Cataract   . Complication of anesthesia 04-20-12   some issues with prolonged sedation after anesthesia   . Diverticulosis   . DYSLIPIDEMIA   .  Esophageal stricture   . GERD   . HYPERTENSION    a. severe, pt intol of med tx, Pt. has severe "whitecoat" syndrome and refused medical therapy.  . Hypothyroidism 09/29/2014   a. pt did not tolerate synthroid and this was subsequently discontinued.  . Mild LV dysfunction    a. 01/2015 Echo: EF 45-50%, mild MR, mildly dil LA, mod dil RV with mod to sev reduced fxn, mod-sev TR, PASP 48mHg.  . Osteopenia   . Raynaud disease   . URINARY INCONTINENCE 04-20-12   occ. with nighttime sleep pattern    Past Surgical History:  Procedure Laterality Date  . ATRIAL FIBRILLATION ABLATION N/A 01/26/2017   Procedure: Atrial Fibrillation Ablation;  Surgeon: AThompson Grayer MD;  [facility];  Service: Cardiovascular;  Laterality: N/A;  . breast ultrasound Right 09/13/13   There is no sonographic evidence of malignancy. the 04.3XVcomplicated cyst in (R) breast is consistent with a benign finding. repeat in 1 year  . CARDIAC CATHETERIZATION N/A 08/29/2015   Procedure: Right Heart Cath;  Surgeon: DLarey Dresser MD;  [facility];  Service: Cardiovascular;  Laterality: N/A;  . CARDIOVERSION N/A 01/30/2015   Procedure: CARDIOVERSION;  Surgeon: MSanda Klein MD;  Location: [facility];  Service: Cardiovascular;  Laterality: N/A;  . CHOLECYSTECTOMY     '90-"sludge"  . NASAL FRACTURE SURGERY  2006  . RIGHT HEART CATH N/A 06/30/2017   Procedure: RIGHT HEART CATH;  Surgeon: MLarey Dresser MD;  [facility];  Service: Cardiovascular;  Laterality: N/A;  . TEE WITHOUT CARDIOVERSION N/A 01/30/2015   Procedure: TRANSESOPHAGEAL ECHOCARDIOGRAM (TEE);  Surgeon: MSanda Klein MD;  [facility];  Service: Cardiovascular;  Laterality: N/A;  . TEE WITHOUT CARDIOVERSION N/A 01/25/2017   Procedure: TRANSESOPHAGEAL ECHOCARDIOGRAM (TEE);  Surgeon: RFay Records MD;  [facility];  Service: Cardiovascular;   Laterality: N/A;  . TOTAL HIP ARTHROPLASTY  04/26/2012   Procedure: TOTAL HIP ARTHROPLASTY ANTERIOR APPROACH;  Surgeon: MMauri Pole MD;  Location: [facility];  Service: Orthopedics;  Laterality: Right;  . TUBAL LIGATION  1980    Current Medications: Outpatient Medications Prior to Visit  Medication Sig Dispense Refill  . acetaminophen (TYLENOL) 325 MG tablet Take 2 tablets (650 mg total) by mouth every 4 (four) hours as needed for headache or mild pain.    . Calcium Carb-Cholecalciferol (CALCIUM 600+D) 600-800 MG-UNIT TABS Take 1 tablet by mouth daily.    .Marland Kitchenconjugated estrogens (PREMARIN) vaginal cream Place 1 Applicatorful vaginally once a week.    . diazepam (VALIUM) 5 MG tablet TAKE 1 TABLET EVERY 8 HOURS AS NEEDED FOR ANXIETY 30 tablet 2  . ELIQUIS 5 MG TABS tablet TAKE 1 TABLET BY MOUTH TWICE A DAY 180 tablet 1  . famotidine (PEPCID) 20 MG tablet TAKE 1 TABLET BY MOUTH TWICE A DAY 180 tablet 3  . flecainide (TAMBOCOR) 50 MG tablet Take 1 tablet (50 mg total) by mouth 2 (two) times daily. Please keep upcoming appt with Dr. ARayann Hemanin November before anymore refills. Thank you 180 tablet 0  . fluticasone (FLONASE) 50 MCG/ACT nasal spray Place 1 spray daily as needed into both nostrils for allergies or rhinitis.    . furosemide (LASIX) 40 MG tablet TAKE 1.5 TABLETS BY MOUTH EVERY MORNING MAY TAKE 1 TAB IN THE EVENING IF NEEDED 270 tablet 1  . metoprolol succinate (TOPROL-XL) 25 MG 24 hr tablet TAKE 1 TABLET BY MOUTH EVERY DAY 90  tablet 3  . OPSUMIT 10 MG tablet TAKE 1 TABLET BY MOUTH EVERY DAY 30 tablet 11  . potassium chloride (KLOR-CON) 20 MEQ packet Take 40 mEq by mouth daily. 60 packet 5  . Probiotic Product (PROBIOTIC-10) CAPS Take 1 capsule by mouth 3 (three) times a week.     . Riociguat (ADEMPAS) 2.5 MG TABS Take 2.5 mg by mouth 3 (three) times daily. 90 tablet 11  . Selexipag (UPTRAVI) 600 MCG TABS Take 800 mcg by mouth 2 (two) times daily. 60 tablet 3   No facility-administered  medications prior to visit.     Allergies:   Levothyroxine, Statins, Prednisone, and Penicillins   Social History   Socioeconomic History  . Marital status: Married    Spouse name: Not on file  . Number of children: 2  . Years of education: Not on file  . Highest education level: Not on file  Occupational History  . Not on file  Tobacco Use  . Smoking status: Never Smoker  . Smokeless tobacco: Never Used  Substance and Sexual Activity  . Alcohol use: Yes    Alcohol/week: 2.0 standard drinks    Types: 2 Glasses of wine per week    Comment: several glasses wine weekly  . Drug use: No  . Sexual activity: Yes  Other Topics Concern  . Not on file  Social History Narrative   She enjoys birding.  Lives at home with husband.   Married.   retired Quarry manager, Tax inspector   Social Determinants of Radio broadcast assistant Strain:   . Difficulty of Paying Living Expenses: Not on file  Food Insecurity:   . Worried About Charity fundraiser in the Last Year: Not on file  . Ran Out of Food in the Last Year: Not on file  Transportation Needs:   . Lack of Transportation (Medical): Not on file  . Lack of Transportation (Non-Medical): Not on file  Physical Activity:   . Days of Exercise per Week: Not on file  . Minutes of Exercise per Session: Not on file  Stress:   . Feeling of Stress : Not on file  Social Connections:   . Frequency of Communication with Friends and Family: Not on file  . Frequency of Social Gatherings with Friends and Family: Not on file  . Attends Religious Services: Not on file  . Active Member of Clubs or Organizations: Not on file  . Attends Archivist Meetings: Not on file  . Marital Status: Not on file     Family History:  The patient's family history includes Other in an other family member. She was adopted.   ROS General: Negative; No fevers, chills, or night sweats;  HEENT: Negative; No changes in vision or  hearing, sinus congestion, difficulty swallowing Pulmonary: Negative; No cough, wheezing, shortness of breath, hemoptysis Cardiovascular: Negative; No chest pain, presyncope, syncope, palpitations GI: Negative; No nausea, vomiting, diarrhea, or abdominal pain GU: Negative; No dysuria, hematuria, or difficulty voiding Musculoskeletal: Negative; no myalgias, joint pain, or weakness Hematologic/Oncology: Negative; no easy bruising, bleeding Endocrine: Negative; no heat/cold intolerance; no diabetes Neuro: Negative; no changes in balance, headaches Skin: Negative; No rashes or skin lesions Psychiatric: Negative; No behavioral problems, depression Sleep: Positive for severe sleep apnea.  Since initiating CPAP therapy there is no residual snoring, daytime sleepiness, hypersomnolence, bruxism, restless legs, hypnogognic hallucinations, no cataplexy Other comprehensive 14 point system review is negative.   PHYSICAL EXAM:   VS:  BP 115/66   Pulse 69   Ht 5' (1.524 m)   Wt 148 lb (67.1 kg)   SpO2 95%   BMI 28.90 kg/m    Wt Readings from Last 3 Encounters:  09/05/19 148 lb (67.1 kg)  08/08/19 150 lb 12.8 oz (68.4 kg)  05/31/19 144 lb (65.3 kg)      Physical Exam BP 115/66   Pulse 69   Ht 5' (1.524 m)   Wt 148 lb (67.1 kg)   SpO2 95%   BMI 28.90 kg/m  General: Alert, oriented, no distress.  Skin: normal turgor, no rashes, warm and dry HEENT: Normocephalic, atraumatic. Pupils equal round and reactive to light; sclera anicteric; extraocular muscles intact; Fundi ** Nose without nasal septal hypertrophy Mouth/Parynx benign; Mallinpatti scale Neck: No JVD, no carotid bruits; normal carotid upstroke Lungs: clear to ausculatation and percussion; no wheezing or rales Chest wall: without tenderness to palpitation Heart: PMI not displaced, RRR, s1 s2 normal, 1/6 systolic murmur, no diastolic murmur, no rubs, gallops, thrills, or heaves Abdomen: soft, nontender; no hepatosplenomehaly, BS+;  abdominal aorta nontender and not dilated by palpation. Back: no CVA tenderness Pulses 2+ Musculoskeletal: full range of motion, normal strength, no joint deformities Extremities: no clubbing cyanosis or edema, Homan's sign negative  Neurologic: grossly nonfocal; Cranial nerves grossly wnl Psychologic: Normal mood and affect    General: Alert, oriented, no distress.  Skin: normal turgor, no rashes, warm and dry HEENT: Normocephalic, atraumatic. Pupils equal round and reactive to light; sclera anicteric; extraocular muscles intact; Fundi Without hemorrhages or exudates. Nose without nasal septal hypertrophy Mouth/Parynx benign; Mallinpatti scale 3 Neck: No JVD, no carotid bruits; normal carotid upstroke Lungs: clear to ausculatation and percussion; no wheezing or rales Chest wall: without tenderness to palpitation Heart: PMI not displaced, RRR, s1 s2 normal, 1/6 systolic murmur, no diastolic murmur, no rubs, gallops, thrills, or heaves Abdomen: soft, nontender; no hepatosplenomehaly, BS+; abdominal aorta nontender and not dilated by palpation. Back: no CVA tenderness Pulses 2+ Musculoskeletal: full range of motion, normal strength, no joint deformities Extremities: no clubbing cyanosis or edema, Homan's sign negative  Neurologic: grossly nonfocal; Cranial nerves grossly wnl Psychologic: Normal mood and affect   Studies/Labs Reviewed:   ECG (independently read by me): Normal sinus rhythm at 79 bpm with frequent PACs.  [place] complex V1 V2.  December 2018 ECG (independently read by me):  Sinus rhythm with PACs.  Poor progression V1 and V2.  Normal intervals.   An independent review of her last ECG from 08/08/2015 reveals normal sinus rhythm at 70 bpm.  There is left axis deviation and incomplete right bundle branch block.  Recent Labs: BMP Latest Ref Rng & Units 08/17/2019 08/08/2019 04/19/2019  Glucose 70 - 99 mg/dL 90 94 93  BUN 8 - 23 mg/dL 31(H) 43(H) 34(H)  Creatinine 0.44 - 1.00  mg/dL 2.44(H) 2.58(H) 2.11(H)  BUN/Creat Ratio 12 - 28 - - -  Sodium 135 - 145 mmol/L 142 138 140  Potassium 3.5 - 5.1 mmol/L 3.9 4.0 3.7  Chloride 98 - 111 mmol/L 105 104 104  CO2 22 - 32 mmol/L _0 Calcium 8.9 - 10.3 mg/dL 9.8 9.3 9.7     Hepatic Function Latest Ref Rng & Units 04/19/2019 09/01/2018 10/08/2017  Total Protein 6.0 - 8.3 g/dL 7.6 7.6 7.2  Albumin 3.5 - 5.2 g/dL 4.3 4.0 4.0  AST 0 - 37 U/L _1 ALT 0 - 35 U/L _2 Alk Phosphatase  39 - 117 U/L 54 55 59  Total Bilirubin 0.2 - 1.2 mg/dL 0.8 0.5 0.6  Bilirubin, Direct 0.0 - 0.3 mg/dL - - -    CBC Latest Ref Rng & Units 08/08/2019 04/19/2019 09/01/2018  WBC 4.0 - 10.5 K/uL 4.9 5.7 6.7  Hemoglobin 12.0 - 15.0 g/dL 11.5(L) 12.8 12.2  Hematocrit 36.0 - 46.0 % 37.2 38.3 36.4  Platelets 150 - 400 K/uL 201 193.0 288.0   Lab Results  Component Value Date   MCV 92.1 08/08/2019   MCV 87.0 04/19/2019   MCV 85.6 09/01/2018   Lab Results  Component Value Date   TSH 0.189 (L) 08/08/2019   Lab Results  Component Value Date   HGBA1C 5.2 04/19/2019     BNP    Component Value Date/Time   BNP 179.1 (H) 07/20/2018 1140    ProBNP    Component Value Date/Time   PROBNP 108.0 (H) 09/01/2018 1100     Lipid Panel     Component Value Date/Time   CHOL 188 04/19/2019 1229   TRIG 163.0 (H) 04/19/2019 1229   TRIG 185 02/11/2009 0000   HDL 49.70 04/19/2019 1229   CHOLHDL 4 04/19/2019 1229   VLDL 32.6 04/19/2019 1229   LDLCALC 105 (H) 04/19/2019 1229     RADIOLOGY: ECHOCARDIOGRAM COMPLETE  Result Date: 08/17/2019   ECHOCARDIOGRAM REPORT   Patient Name:   ELENY CORTEZ Date of Exam: 08/17/2019 Medical Rec #:  [id number]      Height:       60.0 in Accession #:    [id number]     Weight:       150.8 lb Date of Birth:  May 07, 1946      BSA:          1.66 m Patient Age:    59 years       BP:           111/71 mmHg Patient Gender: F              HR:           61 bpm. Exam Location:  Outpatient Procedure: 2D Echo,  Cardiac Doppler and Color Doppler Indications:    Y63.78 Chronic diastolic (congestive) heart failure  History:        Patient has prior history of Echocardiogram examinations, most                 recent 07/20/2018. Arrythmias:Atrial Fibrillation; Risk                 Factors:Hypertension and Dyslipidemia. Hypothyroidism. GERD.  Sonographer:    Jonelle Sidle Dance Referring Phys: [place]  1. Left ventricular ejection fraction, by visual estimation, is 60 to 65%. The left ventricle has normal function. There is no left ventricular hypertrophy.  2. Left ventricular diastolic function could not be evaluated.  3. Right ventricular pressure overload.  4. The left ventricle has no regional wall motion abnormalities.  5. Global right ventricle has normal systolic function.The right ventricular size is moderately enlarged. No increase in right ventricular wall thickness.  6. Left atrial size was mildly dilated.  7. Right atrial size was mild-moderately dilated.  8. Mild mitral annular calcification.  9. The mitral valve is degenerative. Mild mitral valve regurgitation. 10. The tricuspid valve is normal in structure. Mild to moderate tricuspid regurgitation. 11. The aortic valve is tricuspid. Aortic valve regurgitation is mild. Mild aortic valve sclerosis without stenosis. 12. Severely elevated pulmonary artery systolic pressure.  13. The tricuspid regurgitant velocity is 4.62 m/s, and with an assumed right atrial pressure of 3 mmHg, the estimated right ventricular systolic pressure is severely elevated at 88.2 mmHg. 14. The inferior vena cava is normal in size with greater than 50% respiratory variability, suggesting right atrial pressure of 3 mmHg. In comparison to the previous echocardiogram(s): 07/20/2018. LVEF unchanged. PA pressures now severe. RV moderately dilated compared with prior, but function preserved. FINDINGS  Left Ventricle: Left ventricular ejection fraction, by visual estimation, is 60  to 65%. The left ventricle has normal function. The left ventricle has no regional wall motion abnormalities. The left ventricular internal cavity size was the left ventricle is normal in size. There is no left ventricular hypertrophy. The interventricular septum is flattened in systole, consistent with right ventricular pressure overload. The left ventricular diastology could not be evaluated due to abnormal septal  motion. Left ventricular diastolic function could not be evaluated. Right Ventricle: The right ventricular size is moderately enlarged. No increase in right ventricular wall thickness. Global RV systolic function is has normal systolic function. The tricuspid regurgitant velocity is 4.62 m/s, and with an assumed right atrial pressure of 3 mmHg, the estimated right ventricular systolic pressure is severely elevated at 88.2 mmHg. Left Atrium: Left atrial size was mildly dilated. Right Atrium: Right atrial size was mild-moderately dilated Pericardium: There is no evidence of pericardial effusion. Mitral Valve: The mitral valve is degenerative in appearance. Mild mitral annular calcification. Mild mitral valve regurgitation. Tricuspid Valve: The tricuspid valve is normal in structure. Tricuspid valve regurgitation mild-moderate. Aortic Valve: The aortic valve is tricuspid. Aortic valve regurgitation is mild. Mild aortic valve sclerosis is present, with no evidence of aortic valve stenosis. Mild aortic valve annular calcification. Pulmonic Valve: The pulmonic valve was grossly normal. Pulmonic valve regurgitation is mild. Pulmonic regurgitation is mild. Aorta: The aortic root and ascending aorta are structurally normal, with no evidence of dilitation. Venous: The inferior vena cava is normal in size with greater than 50% respiratory variability, suggesting right atrial pressure of 3 mmHg. IAS/Shunts: No atrial level shunt detected by color flow Doppler.  LEFT VENTRICLE PLAX 2D LVIDd:         4.40 cm   Diastology LVIDs:         2.40 cm  LV e' lateral:   12.00 cm/s LV PW:         0.80 cm  LV E/e' lateral: 10.9 LV IVS:        0.90 cm  LV e' medial:    6.96 cm/s LVOT diam:     1.80 cm  LV E/e' medial:  18.8 LV SV:         68 ml LV SV Index:   39.11 LVOT Area:     2.54 cm  RIGHT VENTRICLE             IVC RV Basal diam:  2.80 cm     IVC diam: 1.40 cm RV S prime:     11.30 cm/s TAPSE (M-mode): 2.5 cm LEFT ATRIUM             Index       RIGHT ATRIUM           Index LA diam:        3.90 cm 2.36 cm/m  RA Area:     21.10 cm LA Vol (A2C):   72.5 ml 43.79 ml/m RA Volume:   61.70 ml  37.27 ml/m LA Vol (A4C):   49.3  ml 29.78 ml/m LA Biplane Vol: 60.2 ml 36.36 ml/m  AORTIC VALVE LVOT Vmax:   140.00 cm/s LVOT Vmean:  88.300 cm/s LVOT VTI:    0.334 m  AORTA Ao Root diam: 3.10 cm Ao Asc diam:  3.50 cm MITRAL VALVE                         TRICUSPID VALVE MV Area (PHT): 3.42 cm              TR Peak grad:   85.2 mmHg MV PHT:        64.38 msec            TR Vmax:        471.00 cm/s MV Decel Time: 222 msec MV E velocity: 131.00 cm/s 103 cm/s  SHUNTS MV A velocity: 77.10 cm/s  70.3 cm/s Systemic VTI:  0.33 m MV E/A ratio:  1.70        1.5       Systemic Diam: 1.80 cm  Eleonore Chiquito MD Electronically signed by Eleonore Chiquito MD Signature Date/Time: 08/17/2019/4:41:03 PM    Final    US THYROID  Result Date: 08/18/2019 CLINICAL DATA:  Follow-up right thyroid nodule. EXAM: THYROID ULTRASOUND TECHNIQUE: Ultrasound examination of the thyroid gland and adjacent soft tissues was performed. COMPARISON:  10/25/2017 FINDINGS: Parenchymal Echotexture: Moderately heterogenous Isthmus: 0.5 cm, previously 0.3 cm Right lobe: 4.8 x 1.5 x 1.5 cm, previously 5.0 x 1.9 x 1.6 cm Left lobe: 4.1 x 2.0 x 1.3 cm, previously 4.2 x 2.5 x 1.3 cm _________________________________________________________ Estimated total number of nodules >/= 1 cm: 0 Number of spongiform nodules >/=  2 cm not described below (TR1): 0 Number of mixed cystic and solid nodules  >/= 1.5 cm not described below (TR2): 0 _________________________________________________________ Again noted are numerous cysts and cystic nodules throughout the thyroid. Nodule # 2: Prior biopsy: No Location: Right; Inferior Maximum size: 0.9 cm; Other 2 dimensions: 0.5 x 0.6 cm, previously, 1.0 x 0.6 x 0.9 cm Composition: mixed cystic and solid (1) Echogenicity: isoechoic (1) Shape: not taller-than-wide (0) Margins: ill-defined (0) Echogenic foci: none (0) ACR TI-RADS total points: 2. ACR TI-RADS risk category:  TR2 (2 points). Significant change in size (>/= 20% in two dimensions and minimal increase of 2 mm): No Change in features: Yes; nodule now appears to be mixed cystic and solid composition rather than a solid nodule. Change in ACR TI-RADS risk category: Yes ACR TI-RADS recommendations: This nodule does NOT meet TI-RADS criteria for biopsy or dedicated follow-up. _________________________________________________________ IMPRESSION: Thyroid tissue is heterogeneous with several small thyroid nodules and cysts. Dominant nodule is located in the inferior right thyroid lobe. This nodule has mixed cystic and solid composition and measures up to 0.9 cm. Nodule does not meet criteria for biopsy or dedicated follow-up. The above is in keeping with the ACR TI-RADS recommendations - J Am Coll Radiol 2017;14:587-595. Electronically Signed   By: Markus Daft M.D.   On: 08/18/2019 08:28     Additional studies/ records that were reviewed today include:  I review the office records of Dr. Aundra [name], the patient's sleep study, ECG, and most recent download.    ASSESSMENT:    1. OSA (obstructive sleep apnea)   2. Chronic diastolic CHF (congestive heart failure) (HCC)   3. Pulmonary hypertension (HCC)   4. Paroxysmal atrial fibrillation ([place]): Status post remote cardioversion, maintaining sinus rhythm on flecainide      PLAN:  Ms. Yancy Hascall is a very pleasant 74 year old female who has a history of  paroxysmal atrial fibrillation and diastolic heart failure and was found to have significant pulmonary hypertension.  I reviewed her sleep study in detail with her, which demonstrates severe sleep apnea and significant oxygen desaturation to a nadir of 80%.  On her initial study, she had reduced sleep efficiency and moderate snoring.  She has been on CPAP therapy at 13 cm and is 100% compliance with reference to usage stays as well as usage greater than 4 hours.  She is averaging 9 hours and 17 minutes of sleep per night.  She has noticed some dryness.  I have suggested she use nasal saline in her nares, which will also help her sinuses and use this prior to CPAP use at night and also use in the morning.  We discussed alteration of her humidification.  She is meeting compliance standards.  Discussed the role of untreated sleep apnea in cardiovascular disease and its association with mild primary hypertension.  I do not feel to severe pulmonary hypertension is related primarily to her obstructive sleep apnea.  She has Raynaud's disease and does not have definitive crest syndrome.  Her blood pressure today in the office was excellent at 116/74.  She has a ResMed AirFit 10 small mask.  Per Medicare requirements, I will see her in one year for reevaluation.   Medication Adjustments/Labs and Tests Ordered: Current medicines are reviewed at length with the patient today.  Concerns regarding medicines are outlined above.  Medication changes, Labs and Tests ordered today are listed in the Patient Instructions below.  Patient Instructions  Medication Instructions:  CONTINUE WITH CURRENT MEDICATIONS *If you need a refill on your cardiac medications before your next appointment, please call your pharmacy*  Follow-Up: At [facility], you and your health needs are our priority.  As part of our continuing mission to provide you with exceptional heart care, we have created designated Provider Care Teams.  These Care  Teams include your primary Cardiologist (physician) and Advanced Practice Providers (APPs -  Physician Assistants and Nurse Practitioners) who all work together to provide you with the care you need, when you need it.  Your next appointment:   12 month(s)  The format for your next appointment:   In Person  Provider:   Shelva Majestic, MD    Time spent: 30 minutes Signed, Tiffany Majestic, MD  09/06/2019 4:11 PM    [facility] [address], [place], [place], [place]  [address] Phone: [phone]

## 2019-09-06 ENCOUNTER — Encounter: Payer: Self-pay | Admitting: Cardiovascular Disease

## 2019-09-13 ENCOUNTER — Other Ambulatory Visit: Payer: Self-pay

## 2019-09-14 ENCOUNTER — Ambulatory Visit: Payer: Medicare PPO | Admitting: Internal Medicine

## 2019-09-14 ENCOUNTER — Encounter: Payer: Self-pay | Admitting: Internal Medicine

## 2019-09-14 VITALS — BP 120/60 | HR 77 | Ht 59.25 in | Wt 148.0 lb

## 2019-09-14 DIAGNOSIS — E0590 Thyrotoxicosis, unspecified without thyrotoxic crisis or storm: Secondary | ICD-10-CM

## 2019-09-14 LAB — TSH: TSH: 0.09 u[IU]/mL — ABNORMAL LOW (ref 0.35–4.50)

## 2019-09-14 LAB — T3, FREE: T3, Free: 3.3 pg/mL (ref 2.3–4.2)

## 2019-09-14 LAB — T4, FREE: Free T4: 1.47 ng/dL (ref 0.60–1.60)

## 2019-09-14 NOTE — Patient Instructions (Signed)
Please stop at the lab.  Please come back for a follow-up appointment in 3 months.   Hyperthyroidism  Hyperthyroidism is when the thyroid gland is too active (overactive). The thyroid gland is a small gland located in the lower front part of the neck, just in front of the windpipe (trachea). This gland makes hormones that help control how the body uses food for energy (metabolism) as well as how the heart and brain function. These hormones also play a role in keeping your bones strong. When the thyroid is overactive, it produces too much of a hormone called thyroxine. What are the causes? This condition may be caused by:  Graves' disease. This is a disorder in which the body's disease-fighting system (immune system) attacks the thyroid gland. This is the most common cause.  Inflammation of the thyroid gland.  A tumor in the thyroid gland.  Use of certain medicines, including: ? Prescription thyroid hormone replacement. ? Herbal supplements that mimic thyroid hormones. ? Amiodarone therapy.  Solid or fluid-filled lumps within your thyroid gland (thyroid nodules).  Taking in a large amount of iodine from foods or medicines. What increases the risk? You are more likely to develop this condition if:  You are female.  You have a family history of thyroid conditions.  You smoke tobacco.  You use a medicine called lithium.  You take medicines that affect the immune system (immunosuppressants). What are the signs or symptoms? Symptoms of this condition include:  Nervousness.  Inability to tolerate heat.  Unexplained weight loss.  Diarrhea.  Change in the texture of hair or skin.  Heart skipping beats or making extra beats.  Rapid heart rate.  Loss of menstruation.  Shaky hands.  Fatigue.  Restlessness.  Sleep problems.  Enlarged thyroid gland or a lump in the thyroid (nodule). You may also have symptoms of Graves' disease, which may include:  Protruding  eyes.  Dry eyes.  Red or swollen eyes.  Problems with vision. How is this diagnosed? This condition may be diagnosed based on:  Your symptoms and medical history.  A physical exam.  Blood tests.  Thyroid ultrasound. This test involves using sound waves to produce images of the thyroid gland.  A thyroid scan. A radioactive substance is injected into a vein, and images show how much iodine is present in the thyroid.  Radioactive iodine uptake test (RAIU). A small amount of radioactive iodine is given by mouth to see how much iodine the thyroid absorbs after a certain amount of time. How is this treated? Treatment depends on the cause and severity of the condition. Treatment may include:  Medicines to reduce the amount of thyroid hormone your body makes.  Radioactive iodine treatment (radioiodine therapy). This involves swallowing a small dose of radioactive iodine, in capsule or liquid form, to kill thyroid cells.  Surgery to remove part or all of your thyroid gland. You may need to take thyroid hormone replacement medicine for the rest of your life after thyroid surgery.  Medicines to help manage your symptoms. Follow these instructions at home:   Take over-the-counter and prescription medicines only as told by your health care provider.  Do not use any products that contain nicotine or tobacco, such as cigarettes and e-cigarettes. If you need help quitting, ask your health care provider.  Follow any instructions from your health care provider about diet. You may be instructed to limit foods that contain iodine.  Keep all follow-up visits as told by your health care provider. This  is important. ? You will need to have blood tests regularly so that your health care provider can monitor your condition. Contact a health care provider if:  Your symptoms do not get better with treatment.  You have a fever.  You are taking thyroid hormone replacement medicine and you: ? Have  symptoms of depression. ? Feel like you are tired all the time. ? Gain weight. Get help right away if:  You have chest pain.  You have decreased alertness or a change in your awareness.  You have abdominal pain.  You feel dizzy.  You have a rapid heartbeat.  You have an irregular heartbeat.  You have difficulty breathing. Summary  The thyroid gland is a small gland located in the lower front part of the neck, just in front of the windpipe (trachea).  Hyperthyroidism is when the thyroid gland is too active (overactive) and produces too much of a hormone called thyroxine.  The most common cause is Graves' disease, a disorder in which your immune system attacks the thyroid gland.  Hyperthyroidism can cause various symptoms, such as unexplained weight loss, nervousness, inability to tolerate heat, or changes in your heartbeat.  Treatment may include medicine to reduce the amount of thyroid hormone your body makes, radioiodine therapy, surgery, or medicines to manage symptoms. This information is not intended to replace advice given to you by your health care provider. Make sure you discuss any questions you have with your health care provider. Document Revised: 07/02/2017 Document Reviewed: 06/30/2017 Elsevier Patient Education  2020 [employer].

## 2019-09-14 NOTE — Progress Notes (Addendum)
Patient ID: Tiffany [name], female   DOB: 02/12/1946, 74 y.o.   MRN: [id number]   This visit occurred during the SARS-CoV-2 public health emergency.  Safety protocols were in place, including screening questions prior to the visit, additional usage of staff PPE, and extensive cleaning of exam room while observing appropriate contact time as indicated for disinfecting solutions.   HPI  Tiffany [name] is a 74 y.o.-year-old female, referred by her cardiologist, Dr. 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  Patient had fluctuating TFTs since 2016, which she was found to have a slightly high TSH.  She was briefly on levothyroxine at that time.  This was stopped due to development of shortness of breath, however, in retrospect, this could have been due to undiagnosed PAH.    TSH normalized subsequently, but towards the end of 2016, the TSH returned elevated again.  Afterwards, the next TSH was normal in 10/2017 but in 04/2019 she was found to have a suppressed TSH, which was confirmed in 08/2019 along with a high free T4.  She was referred to endocrinology at that point.  Of note, she has a significant history of atrial fibrillation and had to have cardioversion in the past. She also has [place] >> she has SOB chronically.  She was on Amiodarone 3-4 years ago along with metoprolol, but amiodarone was stopped after she had a syncopal episode.  She was on Prednisone >1 year ago for wheezing. She had significant insomnia afterwards.  No recent steroid courses.  I reviewed pt's thyroid tests: Lab Results  Component Value Date   TSH 0.189 (L) 08/08/2019   TSH 0.15 (L) 04/19/2019   TSH 2.66 10/08/2017   TSH 7.81 (H) 07/15/2015   TSH 6.530 (H) 04/15/2015   TSH 1.657 01/28/2015   TSH 1.16 01/11/2015   TSH 5.25 (H) 09/28/2014   TSH 4.62 10/11/2013   TSH 2.06 10/01/2011   FREET4 1.49 (H) 08/08/2019   FREET4 1.30 04/19/2019   FREET4 1.01 07/15/2015   FREET4 1.22 (H) 04/15/2015   FREET4  1.25 (H) 01/28/2015   FREET4 1.26 01/11/2015   T3FREE 3.1 08/08/2019   T3FREE 2.0 04/15/2015   T3FREE 2.7 02/13/2015   Antithyroid antibodies: No results found for: TSI  She had a thyroid ultrasound (08/18/2019) that showed only one subcentimeter thyroid nodule, not worrisome. Parenchymal Echotexture: Moderately heterogenous Isthmus: 0.5 cm, previously 0.3 cm Right lobe: 4.8 x 1.5 x 1.5 cm, previously 5.0 x 1.9 x 1.6 cm Left lobe: 4.1 x 2.0 x 1.3 cm, previously 4.2 x 2.5 x 1.3 cm  Again noted are numerous cysts and cystic nodules throughout the thyroid.  Nodule # 2: Prior biopsy: No Location: Right; Inferior Maximum size: 0.9 cm; Other 2 dimensions: 0.5 x 0.6 cm, previously, 1.0 x 0.6 x 0.9 cm Composition: mixed cystic and solid (1) Echogenicity: isoechoic (1) Change in features: Yes; nodule now appears to be mixed cystic and solid composition rather than a solid nodule. This nodule does NOT meet TI-RADS criteria for biopsy or dedicated follow-up. _____________________________________________________  IMPRESSION: Thyroid tissue is heterogeneous with several small thyroid nodules and cysts.  Dominant nodule is located in the inferior right thyroid lobe. This nodule has mixed cystic and solid composition and measures up to 0.9 cm. Nodule does not meet criteria for biopsy or dedicated follow-up.  Pt denies: - feeling nodules in neck - hoarseness - dysphagia - choking - SOB with lying down  She denies: - fatigue - excessive sweating/heat intolerance - tremors -  anxiety - hyperdefecation - weight loss - hair loss  She has: - cold intolerance - palpitations - resolved after starting Flecainide  Unknown FH of thyroid ds. As pt was adopted. No h/o radiation tx to head or neck.  No seaweed or kelp, no recent contrast studies. No steroid use. No herbal supplements. No Biotin use.  Pt. also has a history of GERD, sleep apnea, a hip replacement, had a nose  reconstruction sx, cholecystectomy.  ROS: Constitutional: + see HPI, + decreased appetite Eyes: no blurry vision, no xerophthalmia ENT: no sore throat, + see HPI, + hypoacusis (wears hearing aids) Cardiovascular: no CP/+ SOB/+ palpitations/+ leg swelling Respiratory: no cough/+ SOB Gastrointestinal: no N/+ V/D/C/+ acid reflux Musculoskeletal: no muscle/joint aches Skin: no rashes, + easy bruising Neurological: no tremors/numbness/tingling/dizziness Psychiatric: no depression/anxiety  Past Medical History:  Diagnosis Date  . Arthritis 04-20-12   Osteoarthritis-right hip  . Atrial fibrillation status post cardioversion, 01/30/15 maintaining SR.  01/31/2015   a. 01/2015 s/p TEE/DCCV;  b. 02/06/2015 recurrent AF noted, amio added;  c. CHA2DS2VASc = 3-->eliquis.  Marland Kitchen BENIGN POSITIONAL VERTIGO   . Cataract   . Complication of anesthesia 04-20-12   some issues with prolonged sedation after anesthesia  . Diverticulosis   . DYSLIPIDEMIA   . Esophageal stricture   . GERD   . HYPERTENSION    a. severe, pt intol of med tx, Pt. has severe "whitecoat" syndrome and refused medical therapy.  . Hypothyroidism 09/29/2014   a. pt did not tolerate synthroid and this was subsequently discontinued.  . Mild LV dysfunction    a. 01/2015 Echo: EF 45-50%, mild MR, mildly dil LA, mod dil RV with mod to sev reduced fxn, mod-sev TR, PASP 71mmHg.  . Osteopenia   . Raynaud disease   . URINARY INCONTINENCE 04-20-12   occ. with nighttime sleep pattern   Past Surgical History:  Procedure Laterality Date  . ATRIAL FIBRILLATION ABLATION N/A 01/26/2017   Procedure: Atrial Fibrillation Ablation;  Surgeon: Thompson Grayer, MD;  Location: [facility];  Service: Cardiovascular;  Laterality: N/A;  . breast ultrasound Right 09/13/13   There is no sonographic evidence of malignancy. the [id number] complicated cyst in (R) breast is consistent with a benign finding. repeat in 1 year  . CARDIAC CATHETERIZATION N/A 08/29/2015    Procedure: Right Heart Cath;  Surgeon: Larey Dresser, MD;  Location: [facility];  Service: Cardiovascular;  Laterality: N/A;  . CARDIOVERSION N/A 01/30/2015   Procedure: CARDIOVERSION;  Surgeon: Sanda Klein, MD;  Location: [facility];  Service: Cardiovascular;  Laterality: N/A;  . CHOLECYSTECTOMY     '90-"sludge"  . NASAL FRACTURE SURGERY  2006  . RIGHT HEART CATH N/A 06/30/2017   Procedure: RIGHT HEART CATH;  Surgeon: Larey Dresser, MD;  Location: [facility];  Service: Cardiovascular;  Laterality: N/A;  . TEE WITHOUT CARDIOVERSION N/A 01/30/2015   Procedure: TRANSESOPHAGEAL ECHOCARDIOGRAM (TEE);  Surgeon: Sanda Klein, MD;  Location: [facility];  Service: Cardiovascular;  Laterality: N/A;  . TEE WITHOUT CARDIOVERSION N/A 01/25/2017   Procedure: TRANSESOPHAGEAL ECHOCARDIOGRAM (TEE);  Surgeon: Fay Records, MD;  Location: [facility];  Service: Cardiovascular;  Laterality: N/A;  . TOTAL HIP ARTHROPLASTY  04/26/2012   Procedure: TOTAL HIP ARTHROPLASTY ANTERIOR APPROACH;  Surgeon: Mauri Pole, MD;  Location: [facility];  Service: Orthopedics;  Laterality: Right;  . TUBAL LIGATION  1980   Social History   Socioeconomic History  . Marital status: Married    Spouse  name: Not on file  . Number of children: 2  . Years of education: Not on file  . Highest education level: Not on file  Occupational History  . Not on file  Tobacco Use  . Smoking status: Never Smoker  . Smokeless tobacco: Never Used  Substance and Sexual Activity  . Alcohol use: Yes    Alcohol/week: 2.0 standard drinks    Types: 2 Glasses of wine per week    Comment: several glasses wine weekly  . Drug use: No  . Sexual activity: Yes  Other Topics Concern  . Not on file  Social History Narrative   She enjoys birding.  Lives at home with husband.   Married.   retired Quarry manager, Tax inspector   Social Determinants of Radio broadcast assistant Strain:   . Difficulty of  Paying Living Expenses: Not on file  Food Insecurity:   . Worried About Charity fundraiser in the Last Year: Not on file  . Ran Out of Food in the Last Year: Not on file  Transportation Needs:   . Lack of Transportation (Medical): Not on file  . Lack of Transportation (Non-Medical): Not on file  Physical Activity:   . Days of Exercise per Week: Not on file  . Minutes of Exercise per Session: Not on file  Stress:   . Feeling of Stress : Not on file  Social Connections:   . Frequency of Communication with Friends and Family: Not on file  . Frequency of Social Gatherings with Friends and Family: Not on file  . Attends Religious Services: Not on file  . Active Member of Clubs or Organizations: Not on file  . Attends Archivist Meetings: Not on file  . Marital Status: Not on file  Intimate Partner Violence:   . Fear of Current or Ex-Partner: Not on file  . Emotionally Abused: Not on file  . Physically Abused: Not on file  . Sexually Abused: Not on file   Current Outpatient Medications on File Prior to Visit  Medication Sig Dispense Refill  . acetaminophen (TYLENOL) 325 MG tablet Take 2 tablets (650 mg total) by mouth every 4 (four) hours as needed for headache or mild pain.    . Calcium Carb-Cholecalciferol (CALCIUM 600+D) 600-800 MG-UNIT TABS Take 1 tablet by mouth daily.    Marland Kitchen conjugated estrogens (PREMARIN) vaginal cream Place 1 Applicatorful vaginally once a week.    . diazepam (VALIUM) 5 MG tablet TAKE 1 TABLET EVERY 8 HOURS AS NEEDED FOR ANXIETY 30 tablet 2  . ELIQUIS 5 MG TABS tablet TAKE 1 TABLET BY MOUTH TWICE A DAY 180 tablet 1  . famotidine (PEPCID) 20 MG tablet TAKE 1 TABLET BY MOUTH TWICE A DAY 180 tablet 3  . flecainide (TAMBOCOR) 50 MG tablet Take 1 tablet (50 mg total) by mouth 2 (two) times daily. Please keep upcoming appt with Dr. [name] in November before anymore refills. Thank you 180 tablet 0  . fluticasone (FLONASE) 50 MCG/ACT nasal spray Place 1 spray  daily as needed into both nostrils for allergies or rhinitis.    . furosemide (LASIX) 40 MG tablet TAKE 1.5 TABLETS BY MOUTH EVERY MORNING MAY TAKE 1 TAB IN THE EVENING IF NEEDED 270 tablet 1  . metoprolol succinate (TOPROL-XL) 25 MG 24 hr tablet TAKE 1 TABLET BY MOUTH EVERY DAY 90 tablet 3  . OPSUMIT 10 MG tablet TAKE 1 TABLET BY MOUTH EVERY DAY 30 tablet 11  .  potassium chloride (KLOR-CON) 20 MEQ packet Take 40 mEq by mouth daily. 60 packet 5  . Probiotic Product (PROBIOTIC-10) CAPS Take 1 capsule by mouth 3 (three) times a week.     . Riociguat (ADEMPAS) 2.5 MG TABS Take 2.5 mg by mouth 3 (three) times daily. 90 tablet 11  . Selexipag (UPTRAVI) 600 MCG TABS Take 800 mcg by mouth 2 (two) times daily. 60 tablet 3   No current facility-administered medications on file prior to visit.   Allergies  Allergen Reactions  . Levothyroxine Shortness Of Breath and Other (See Comments)    Exhaustion also - Per pt her this medication could have not been the reason for her symptoms  . Statins Other (See Comments)    "Pain, weakness and kidney problems"  . Prednisone Other (See Comments)    Severe insomnia  . Penicillins Other (See Comments)    dry mouth Has patient had a PCN reaction causing immediate rash, facial/tongue/throat swelling, SOB or lightheadedness with hypotension: No Has patient had a PCN reaction causing severe rash involving mucus membranes or skin necrosis: No Has patient had a PCN reaction that required hospitalization No Has patient had a PCN reaction occurring within the last 10 years: No If all of the above answers are "NO", then may proceed with Cephalosporin use.    Family History  Adopted: Yes  Problem Relation Age of Onset  . Other Other        patient was adopted    PE: BP 120/60   Pulse 77   Ht 4' 11.25" (1.505 m) Comment: measured today without shoes  Wt 148 lb (67.1 kg)   SpO2 93% Comment: patient states she has Raynauds  BMI 29.64 kg/m  Wt Readings from  Last 3 Encounters:  09/14/19 148 lb (67.1 kg)  09/05/19 148 lb (67.1 kg)  08/08/19 150 lb 12.8 oz (68.4 kg)   Constitutional: normal weight, in NAD Eyes: PERRLA, EOMI, no exophthalmos, no lid lag, no stare ENT: moist mucous membranes, + slight symmetric rubbery thyromegaly, no thyroid bruits, no cervical lymphadenopathy Cardiovascular: RRR, No MRG, + bilateral LE pitting edema Respiratory: CTA B Gastrointestinal: abdomen soft, NT, ND, BS+ Musculoskeletal: no deformities, strength intact in all 4 Skin: moist, warm, no rashes Neurological: no tremor with outstretched hands, DTR normal in all 4  ASSESSMENT: 1. Thyrotoxicosis  PLAN:  1. Patient with a history of hypothyroidism, but most recently low TSH, without thyrotoxic sxs: She denies weight loss, heat intolerance, hyperdefecation, anxiety.  She does have palpitations, but these are chronic and are resolved now on metoprolol and flecainide. - she does not appear to have exogenous causes for the low TSH.  - We discussed that possible causes of thyrotoxicosis are:  Marland Kitchen Graves ds   . Thyroiditis . toxic multinodular goiter/ toxic adenoma (I cannot feel nodules at palpation of her thyroid, but she does have a small thyroid nodule per review of her latest thyroid ultrasound from 08/2019). - will check the TSH, fT3 and fT4 and also add thyroid stimulating antibodies to screen for Graves' disease.  - If the tests remain abnormal, we may need an uptake and scan to differentiate between the 3 above possible etiologies  - we discussed about possible modalities of treatment for the above conditions, to include methimazole use, radioactive iodine ablation or (last resort) surgery.  We discussed about possible rare side effects of methimazole including low white blood cell count or high LFTs.  - She continues on Toprol-XL and flecainide per  cardiology - no signs of Graves' ophthalmopathy: she does not have any double vision, blurry vision, eye pain,  chemosis. - RTC in 3 months, but likely sooner for repeat labs  Component     Latest Ref Rng & Units 09/14/2019  TSH     0.35 - 4.50 uIU/mL 0.09 (L)  T4,Free(Direct)     0.60 - 1.60 ng/dL 1.47  Triiodothyronine,Free,Serum     2.3 - 4.2 pg/mL 3.3  TSI     <140 % baseline 161 (H)   TSH is still low, with normal free thyroid hormones (subclinical thyrotoxicosis).  TSI's are elevated, however, not much.  I would suggest to start a low-dose methimazole, 5 mg daily but to also check a thyroid uptake and scan.  I will advised the patient to stop the methimazole 5 days prior to the uptake and scan and usually resume it afterwards.  We will plan to check labs again in 5 to 6 weeks.  Narrative & Impression    CLINICAL DATA:  Thyrotoxicosis without thyroid storm, suppressed TSH  EXAM: THYROID SCAN AND UPTAKE - 4 AND 24 HOURS  TECHNIQUE: Following oral administration of I-123 capsule, anterior planar imaging was acquired at 24 hours. Thyroid uptake was calculated with a thyroid probe at 4-6 hours and 24 hours.  RADIOPHARMACEUTICALS:  446.7 uCi I-123 sodium iodide p.o.  COMPARISON:  Thyroid ultrasound 10/25/2017  FINDINGS: Homogeneous tracer distribution in both thyroid lobes.  No focal areas of increased or decreased tracer localization.  4 hour I-123 uptake = 6.4% (normal 5-20%)  24 hour I-123 uptake = 29.4% (normal 10-30%)  IMPRESSION: Normal thyroid scan.  Normal 4 hour and 24 hour radio iodine uptakes.   Electronically Signed   By: Lavonia Dana M.D.   On: 10/06/2019 10:03   Normal uptake and scan.  For now, I will suggest to continue with the low-dose methimazole.  It is possible that she may have very mild Graves' disease versus resolving thyroiditis.  Philemon Kingdom, MD PhD [facility] Endocrinology

## 2019-09-18 ENCOUNTER — Other Ambulatory Visit: Payer: Self-pay | Admitting: Internal Medicine

## 2019-09-18 DIAGNOSIS — E0590 Thyrotoxicosis, unspecified without thyrotoxic crisis or storm: Secondary | ICD-10-CM

## 2019-09-18 LAB — THYROID STIMULATING IMMUNOGLOBULIN: TSI: 161 % baseline — ABNORMAL HIGH (ref <140)

## 2019-09-18 MED ORDER — METHIMAZOLE 5 MG PO TABS: 5.0000 mg | ORAL_TABLET | Freq: Every day | ORAL | 3 refills | Status: DC

## 2019-10-03 ENCOUNTER — Other Ambulatory Visit (HOSPITAL_COMMUNITY): Payer: Self-pay | Admitting: *Deleted

## 2019-10-03 MED ORDER — FLECAINIDE ACETATE 50 MG PO TABS: 50.0000 mg | ORAL_TABLET | Freq: Two times a day (BID) | ORAL | 2 refills | Status: DC

## 2019-10-05 ENCOUNTER — Encounter (HOSPITAL_COMMUNITY)
Admission: RE | Admit: 2019-10-05 | Discharge: 2019-10-05 | Disposition: A | Discharge status: Home / Self Care | Payer: Medicare PPO | Source: Ambulatory Visit | Attending: Internal Medicine | Admitting: Internal Medicine

## 2019-10-05 ENCOUNTER — Other Ambulatory Visit: Payer: Self-pay

## 2019-10-05 DIAGNOSIS — E0590 Thyrotoxicosis, unspecified without thyrotoxic crisis or storm: Secondary | ICD-10-CM | POA: Diagnosis not present

## 2019-10-05 MED ORDER — SODIUM IODIDE I-123 7.4 MBQ CAPS
455.0000 | ORAL_CAPSULE | Freq: Once | ORAL | Status: AC
  Administered 2019-10-05: 455 via ORAL

## 2019-10-06 ENCOUNTER — Encounter (HOSPITAL_COMMUNITY)
Admission: RE | Admit: 2019-10-06 | Discharge: 2019-10-06 | Disposition: A | Discharge status: Home / Self Care | Payer: Medicare PPO | Source: Ambulatory Visit | Attending: Internal Medicine | Admitting: Internal Medicine

## 2019-10-06 DIAGNOSIS — E0590 Thyrotoxicosis, unspecified without thyrotoxic crisis or storm: Secondary | ICD-10-CM | POA: Diagnosis not present

## 2019-11-07 ENCOUNTER — Ambulatory Visit (HOSPITAL_COMMUNITY)
Admission: RE | Admit: 2019-11-07 | Discharge: 2019-11-07 | Disposition: A | Discharge status: Home / Self Care | Payer: Medicare PPO | Source: Ambulatory Visit | Attending: Cardiology | Admitting: Cardiology

## 2019-11-07 ENCOUNTER — Other Ambulatory Visit: Payer: Self-pay

## 2019-11-07 ENCOUNTER — Encounter (HOSPITAL_COMMUNITY): Payer: Self-pay | Admitting: Cardiology

## 2019-11-07 VITALS — BP 116/60 | HR 66 | Wt 147.6 lb

## 2019-11-07 DIAGNOSIS — I5032 Chronic diastolic (congestive) heart failure: Secondary | ICD-10-CM | POA: Diagnosis not present

## 2019-11-07 DIAGNOSIS — I272 Pulmonary hypertension, unspecified: Secondary | ICD-10-CM

## 2019-11-07 DIAGNOSIS — I7300 Raynaud's syndrome without gangrene: Secondary | ICD-10-CM | POA: Diagnosis not present

## 2019-11-07 DIAGNOSIS — G4733 Obstructive sleep apnea (adult) (pediatric): Secondary | ICD-10-CM | POA: Diagnosis not present

## 2019-11-07 DIAGNOSIS — Z7901 Long term (current) use of anticoagulants: Secondary | ICD-10-CM | POA: Diagnosis not present

## 2019-11-07 DIAGNOSIS — E039 Hypothyroidism, unspecified: Secondary | ICD-10-CM | POA: Diagnosis not present

## 2019-11-07 DIAGNOSIS — N1832 Chronic kidney disease, stage 3b: Secondary | ICD-10-CM | POA: Diagnosis not present

## 2019-11-07 DIAGNOSIS — I2721 Secondary pulmonary arterial hypertension: Secondary | ICD-10-CM | POA: Diagnosis not present

## 2019-11-07 DIAGNOSIS — Z79899 Other long term (current) drug therapy: Secondary | ICD-10-CM | POA: Insufficient documentation

## 2019-11-07 DIAGNOSIS — I480 Paroxysmal atrial fibrillation: Secondary | ICD-10-CM

## 2019-11-07 DIAGNOSIS — I130 Hypertensive heart and chronic kidney disease with heart failure and stage 1 through stage 4 chronic kidney disease, or unspecified chronic kidney disease: Secondary | ICD-10-CM | POA: Diagnosis not present

## 2019-11-07 DIAGNOSIS — K219 Gastro-esophageal reflux disease without esophagitis: Secondary | ICD-10-CM | POA: Diagnosis not present

## 2019-11-07 LAB — BASIC METABOLIC PANEL
Anion gap: 10 (ref 5–15)
BUN: 26 mg/dL — ABNORMAL HIGH (ref 8–23)
CO2: 23 mmol/L (ref 22–32)
Calcium: 9.4 mg/dL (ref 8.9–10.3)
Chloride: 105 mmol/L (ref 98–111)
Creatinine, Ser: 2.51 mg/dL — ABNORMAL HIGH (ref 0.44–1.00)
GFR calc Af Amer: 21 mL/min — ABNORMAL LOW (ref >60)
GFR calc non Af Amer: 18 mL/min — ABNORMAL LOW (ref >60)
Glucose, Bld: 100 mg/dL — ABNORMAL HIGH (ref 70–99)
Potassium: 4.1 mmol/L (ref 3.5–5.1)
Sodium: 138 mmol/L (ref 135–145)

## 2019-11-07 LAB — CBC
HCT: 36.7 % (ref 36.0–46.0)
Hemoglobin: 11.3 g/dL — ABNORMAL LOW (ref 12.0–15.0)
MCH: 26.8 pg (ref 26.0–34.0)
MCHC: 30.8 g/dL (ref 30.0–36.0)
MCV: 87.2 fL (ref 80.0–100.0)
Platelets: 179 10*3/uL (ref 150–400)
RBC: 4.21 MIL/uL (ref 3.87–5.11)
RDW: 17.3 % — ABNORMAL HIGH (ref 11.5–15.5)
WBC: 5 10*3/uL (ref 4.0–10.5)
nRBC: 0 % (ref 0.0–0.2)

## 2019-11-07 LAB — BRAIN NATRIURETIC PEPTIDE: B Natriuretic Peptide: 268.4 pg/mL — ABNORMAL HIGH (ref 0.0–100.0)

## 2019-11-07 MED ORDER — UPTRAVI 1000 MCG PO TABS: 1000.0000 ug | ORAL_TABLET | Freq: Two times a day (BID) | ORAL | 4 refills | Status: DC

## 2019-11-07 MED ORDER — FUROSEMIDE 80 MG PO TABS: 80.0000 mg | ORAL_TABLET | Freq: Every day | ORAL | 5 refills | Status: DC

## 2019-11-07 NOTE — Patient Instructions (Addendum)
INCREASE Uptravi to 1085mcg twice a day.  The nurse from [facility] will call you to discuss this.   INCREASE Lasix to 80mg  (1 tab) daily   Labs today We will only contact you if something comes back abnormal or we need to make some changes. Otherwise no news is good news!   Repeat labs in 10 days   You were referred to the nephrology clinic.  You will get a call from [place] to schedule this appointment.   Please do your 6 min walk at home and contact office with results.   Your physician recommends that you schedule a follow-up appointment in: 6 weeks with Dr [name].  Please call office at [phone] option [phone] if you have any questions or concerns.    At the [facility], you and your health needs are our priority. As part of our continuing mission to provide you with exceptional heart care, we have created designated Provider Care Teams. These Care Teams include your primary Cardiologist (physician) and Advanced Practice Providers (APPs- Physician Assistants and Nurse Practitioners) who all work together to provide you with the care you need, when you need it.   You may see any of the following providers on your designated Care Team at your next follow up: Marland Kitchen Dr Glori Bickers . Dr Loralie Champagne . Darrick Grinder, NP . Lyda Jester, PA . Audry Riles, PharmD   Please be sure to bring in all your medications bottles to every appointment.

## 2019-11-08 ENCOUNTER — Other Ambulatory Visit (HOSPITAL_COMMUNITY): Payer: Self-pay

## 2019-11-08 NOTE — Telephone Encounter (Signed)
Rx for Uptravi received for increased dose of 106mcg BID, signed and faxed back. Confirmation received. Jocelyn Lamer from [facility] will contact patient to discuss new dose and shipment.

## 2019-11-08 NOTE — Telephone Encounter (Signed)
Referral faxed to [facility]. Confirmation received.

## 2019-11-08 NOTE — Progress Notes (Signed)
Patient ID: [name], female   DOB: Sep 01, 1945, 74 y.o.   MRN: [id number] PCP: Dr. Sharlet Salina HF Cardiology: Dr. [name]  74 y.o. with history of paroxysmal atrial fibrillation and diastolic CHF was noted to have significant pulmonary hypertension and exertional dyspnea.  She has history of paroxysmal atrial fibrillation and had an episode in 6/16 requiring cardioversion.  She was started on amiodarone but this was stopped with worsening breathing.  Cardiolite in 9/16 showed on ischemia or infarction.     I had her do a RHC in 1/17.  This showed elevated left and right heart filling pressures but also PAH. After cath, she increased her Lasix from 40 qod to 40 daily.  At a prior appointment, I increased Lasix to 40 mg bid and also started her on Opsumit.  At next appointment, I added Adcirca 20 and then titrated it up to 40 mg daily. She continue to be volume overloaded, so  I increased Lasix to 80 qam/40 qpm. She has seemed to do well on this dose.   Echo was done in 6/17, RV mildly dilated with moderately decreased systolic function and PASP 85 mmHg.    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   She had an atrial fibrillation ablation in 6/18.   RHC in 11/18 showed lower PA pressure than in the past with PA 54/16 and PVR 3.7 WU.  Echo showed preserved LV systolic function and normal RV size and function.  Holter showed PACs and PVCs, no atrial fibrillation.  I started her on Toprol XL.  CT chest did not show any significant lung findings. Echo in 12/19 showed EF 65-70%, moderate diastolic dysfunction, mild AS, mild MR, PASP 47 mmHg, normal RV.   She was started on flecainide for paroxysmal atrial fibrillation and has not had symptomatic atrial fibrillation for months now.   Echo in 1/21 showed EF 60-65%, moderately dilated RV with normal systolic function, mild-moderate TR with PASP 88 mmHg, IVC normal.   She returns today for followup of diastolic CHF and  pulmonary hypertension. She was recently able to tolerate increase in Uptravi to 800 mcg bid.  Her breathing is better than it was over the winter but is not as good as it was last Spring.  She is short of breath with vacuuming and making up the bed.  Generally does fine walking on flat ground.  She gets lightheaded when she bends over and stands up. No palpitations.  No chest pain.  No syncope. Weight is down 3 lbs. She has developed increased peripheral edema since I cut back on her Lasix.   ECG (personally reviewed): NSR, LVH, anterior TWIs  6 minute walk (2/17): 141 m 6 minute walk (3/17): 182 m 6 minute walk (5/17): 229 m 6 minute walk (10/17): 305 m 6 minute walk (5/18): 256 m 6 minute walk (8/18): 427 m 6 minute walk (11/18): 61 m, oxygen saturation dropped as low as 70s.  6 minute walk (3/19): 260 m 6 minute walk (6/19): 372 m 6 minute walk (12/19): Experiment (12/16): BNP 1187, ANA 1:640, TSH elevated. Labs (2/17): K 5.4 => 3.9, creatinine 1.78 => 1.66, LDL 112, BNP 880 Labs (4/17): K 4.5, creatinine 1.76 Labs (5/17): K 4.1, creatinine 1.79 Labs (8/17): K 3.1, creatinine 1.7, BNP 404 Labs (9/17): K 3.9, creatinine 1.8, BNP 512 Labs (2/18): K 3.5, creatinine 1.71, BNP 157 Labs (5/18): LDL 120 Labs (6/18): K 3.6, creatinine 1.85 Labs (  8/18): K 3.5, creatinine 1.78, BNP 88 Labs (11/18): K 3.6 => 3.2, creatinine 1.84 => 1.7, hgb 12.7 => 11.2. Labs (3/19): K 3.8, creatinine 1.74, LDL 123, HDL 56, TSH normal Labs (6/19): K 3.5, creatinine 1.91 Labs (9/19): K 3.5, creatinine 1.9 Labs (1/21): K 3.9, creatinine 2.44  PMH:  1. CKD stage III 2. Bradycardia in setting of metoprolol + amiodarone use.  3. Raynauds phenomenon. 4. BPPV 5. OA right hip 6. Hypothyroidism 7. Atrial fibrillation: Paroxysmal.  She was on amiodarone in the past but this was stopped when she became more short of breath.  TEE-guided DCCV in 6/16.  - Atrial fibrillation ablation 6/18.  - Holter (12/18) with  PACs/PVCs, no atrial fibrillation.  8. HTN 9. 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 - TEE (6/16) with EF 45-50%, mildly dilated RV with mildly decreased systolic function, PASP 36 mmHg.  - RHC (1/17) with mean RA 9, PA 80/29 mean 52, mean PCWP 27, CI 1.97 Fick/1.8 thermo, PVR 7.6 Fick/8.4 thermo.  - Echo (6/17): EF 60-65%, mild MR, mild RV dilation with moderately decreased systolic function, moderate TR, PASP 85 mmHg.  - Echo (6/18): EF 93-81%, normal diastolic function, normal RV size and systolic function, PASP 36 mmHg. - Echo (12/18): EF 60-65%, grade II diastolic dysfunction, mild MR, normal RV size and systolic function, PASP 52 mmHg.  - RHC (11/18): mean RA 2, PA 54/16 mean 32, mean PCWP 9, CI 3.83, PVR 3.7 WU.  - Echo (12/19): EF 65-70%, moderate diastolic dysfunction, mild AS, mild MR, PASP 47 mmHg.  - Echo (1/21): EF 60-65%, moderately dilated RV with normal systolic function, mild-moderate TR with PASP 88 mmHg, IVC normal.  10. Pulmonary hypertension: See RHC above.  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  - TEE (6/16) with mildly dilated RV with mildly decreased systolic function. - ANA 8:299, anti-centromere antibody elevated, anti-SCL-70 antibody negative, h/o Raynauds - PFTs (7/16) with minimal obstructive defect but moderately decreased DLCO.  - V/Q scan (2/17) negative for acute or chronic PE.  11. Cardiolite (9/16) with no ischemia/infarction.  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  13. Subclinical hypothyroidism.  14. OSA: Severe on 9/17 sleep study. Now using CPAP.  15. GERD 16. Thyroid nodule: right nodule seen on 3/19 [place].   SH: Married, lives in [place], no smoking, no ETOH.   FH: Adopted.  Daughter has Raynaud's.  ROS: All systems reviewed and negative except as per HPI  Current Outpatient Medications  Medication  Sig Dispense Refill  . acetaminophen (TYLENOL) 325 MG tablet Take 2 tablets (650 mg total) by mouth every 4 (four) hours as needed for headache or mild pain.    . Calcium Carb-Cholecalciferol (CALCIUM 600+D) 600-800 MG-UNIT TABS Take 1 tablet by mouth daily.    Marland Kitchen conjugated estrogens (PREMARIN) vaginal cream Place 1 Applicatorful vaginally once a week.    . diazepam (VALIUM) 5 MG tablet TAKE 1 TABLET EVERY 8 HOURS AS NEEDED FOR ANXIETY 30 tablet 2  . ELIQUIS 5 MG TABS tablet TAKE 1 TABLET BY MOUTH TWICE A DAY 180 tablet 1  . famotidine (PEPCID) 20 MG tablet TAKE 1 TABLET BY MOUTH TWICE A DAY 180 tablet 3  . flecainide (TAMBOCOR) 50 MG tablet Take 1 tablet (50 mg total) by mouth 2 (two) times daily. 180 tablet 2  . fluticasone (FLONASE) 50 MCG/ACT  nasal spray Place 1 spray daily as needed into both nostrils for allergies or rhinitis.    . furosemide (LASIX) 80 MG tablet Take 1 tablet (80 mg total) by mouth daily. 30 tablet 5  . methimazole (TAPAZOLE) 5 MG tablet Take 1 tablet (5 mg total) by mouth daily. 60 tablet 3  . metoprolol succinate (TOPROL-XL) 25 MG 24 hr tablet TAKE 1 TABLET BY MOUTH EVERY DAY 90 tablet 3  . OPSUMIT 10 MG tablet TAKE 1 TABLET BY MOUTH EVERY DAY 30 tablet 11  . potassium chloride (KLOR-CON) 20 MEQ packet Take 40 mEq by mouth daily. 60 packet 5  . Probiotic Product (PROBIOTIC-10) CAPS Take 1 capsule by mouth 3 (three) times a week.     . Riociguat (ADEMPAS) 2.5 MG TABS Take 2.5 mg by mouth 3 (three) times daily. 90 tablet 11  . Selexipag (UPTRAVI) 1000 MCG TABS Take 1,000 mcg by mouth 2 (two) times daily. 60 tablet 4   No current facility-administered medications for this encounter.   BP 116/60   Pulse 66   Wt 67 kg (147 lb 9.6 oz)   SpO2 94%   BMI 29.56 kg/m   General: NAD Neck: JVP 8-9 cm with HJR, no thyromegaly or thyroid nodule.  Lungs: Clear to auscultation bilaterally with normal respiratory effort. CV: Nondisplaced PMI.  Heart regular S1/S2, no S3/S4, 2/6  HSM LLSB.  1+ ankle edema.  No carotid bruit.  Normal pedal pulses.  Abdomen: Soft, nontender, no hepatosplenomegaly, no distention.  Skin: Intact without lesions or rashes.  Neurologic: Alert and oriented x 3.  Psych: Normal affect. Extremities: No clubbing or cyanosis.  HEENT: Normal.   Assessment/Plan: 1. Pulmonary hypertension: Patient has pulmonary arterial hypertension.  She appears to have co-existing diastolic LV dysfunction given elevated PCWP on prior RHC in 1/17.  PFTs did not show significant obstruction, they only showed moderately decreased DLCO consistent with pulmonary vascular disease. V/Q scan did not show evidence for chronic PE.  PVR 7.6 WU by Fick and 8.4 WU by thermodiluation on 1/17 RHC.  Cardiac index was low, 1.97 Fick/1.8 thermo. 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  Given worsening symptoms, RHC was repeated in 11/18.  This showed PVR 3.7 WU with mean PA pressure 32 and normal CI.  Most recent echo in 1/21 showed moderately enlarged RV with normal systolic function, PASP 88 mmHg.  NYHA class II-III symptoms.  She is volume overloaded on exam today (see below).  - Continue riociguat 2.5 mg tid.  - She has sleep apnea, now using CPAP.  - Continue Opsumit.  - She has tolerated increase in Uptravi to 800 mcg bid.  I will try to slowly continue to increase Uptravi, will take her up to 1000 mcg bid.    - She needs 6 minute walk, does not want to do it in a mask in the office today.  She wants to do the walk at home, she has a measured course.  Her husband will time and she will call in the result to [place].  2. Chronic diastolic CHF/RV failure: Elevated PCWP on 1/17 RHC.  Last echo in 1/21 with EF 60-65%. NYHA class II-III symptoms. She is volume overloaded on exam, this is complicated by CKD stage 3b.  - Increase  Lasix to 80 mg daily with BMET today and again in 10 days.  3. Atrial fibrillation: Paroxysmal.  She is in NSR today.  She had atrial fibrillation ablation in 6/18. She is now on flecainide.  - Continue Toprol XL 25 mg daily.  - Continue apixaban - Continue flecainide.  4. CKD: Stage 3b.  - BMET today.  - I am going to refer her to nephrology.    Followup in 6 wks  Loralie Champagne 11/08/2019

## 2019-11-13 ENCOUNTER — Other Ambulatory Visit: Payer: Self-pay | Admitting: Cardiovascular Disease

## 2019-11-13 ENCOUNTER — Other Ambulatory Visit (HOSPITAL_COMMUNITY): Payer: Self-pay | Admitting: Cardiology

## 2019-11-16 DIAGNOSIS — M341 CR(E)ST syndrome: Secondary | ICD-10-CM | POA: Diagnosis not present

## 2019-11-16 DIAGNOSIS — N184 Chronic kidney disease, stage 4 (severe): Secondary | ICD-10-CM | POA: Diagnosis not present

## 2019-11-16 DIAGNOSIS — I270 Primary pulmonary hypertension: Secondary | ICD-10-CM | POA: Diagnosis not present

## 2019-11-16 DIAGNOSIS — I5030 Unspecified diastolic (congestive) heart failure: Secondary | ICD-10-CM | POA: Diagnosis not present

## 2019-11-17 ENCOUNTER — Ambulatory Visit (HOSPITAL_COMMUNITY)
Admission: RE | Admit: 2019-11-17 | Discharge: 2019-11-17 | Disposition: A | Discharge status: Home / Self Care | Payer: Medicare PPO | Source: Ambulatory Visit | Attending: Cardiology | Admitting: Cardiology

## 2019-11-17 ENCOUNTER — Other Ambulatory Visit: Payer: Self-pay

## 2019-11-17 DIAGNOSIS — I5032 Chronic diastolic (congestive) heart failure: Secondary | ICD-10-CM | POA: Diagnosis not present

## 2019-11-17 LAB — BASIC METABOLIC PANEL
Anion gap: 12 (ref 5–15)
BUN: 31 mg/dL — ABNORMAL HIGH (ref 8–23)
CO2: 24 mmol/L (ref 22–32)
Calcium: 9.4 mg/dL (ref 8.9–10.3)
Chloride: 103 mmol/L (ref 98–111)
Creatinine, Ser: 2.59 mg/dL — ABNORMAL HIGH (ref 0.44–1.00)
GFR calc Af Amer: 20 mL/min — ABNORMAL LOW (ref >60)
GFR calc non Af Amer: 17 mL/min — ABNORMAL LOW (ref >60)
Glucose, Bld: 89 mg/dL (ref 70–99)
Potassium: 3.8 mmol/L (ref 3.5–5.1)
Sodium: 139 mmol/L (ref 135–145)

## 2019-11-27 ENCOUNTER — Other Ambulatory Visit: Payer: Self-pay

## 2019-11-27 ENCOUNTER — Ambulatory Visit (HOSPITAL_COMMUNITY)
Admission: RE | Admit: 2019-11-27 | Discharge: 2019-11-27 | Disposition: A | Discharge status: Home / Self Care | Payer: Medicare PPO | Source: Ambulatory Visit | Attending: Physician Assistant | Admitting: Physician Assistant

## 2019-11-27 ENCOUNTER — Encounter (HOSPITAL_COMMUNITY): Payer: Self-pay | Admitting: Physician Assistant

## 2019-11-27 VITALS — BP 120/72 | HR 58 | Ht 59.25 in | Wt 146.2 lb

## 2019-11-27 DIAGNOSIS — I10 Essential (primary) hypertension: Secondary | ICD-10-CM | POA: Diagnosis not present

## 2019-11-27 DIAGNOSIS — I480 Paroxysmal atrial fibrillation: Secondary | ICD-10-CM | POA: Insufficient documentation

## 2019-11-27 DIAGNOSIS — I4891 Unspecified atrial fibrillation: Secondary | ICD-10-CM | POA: Diagnosis present

## 2019-11-27 DIAGNOSIS — E785 Hyperlipidemia, unspecified: Secondary | ICD-10-CM | POA: Diagnosis not present

## 2019-11-27 DIAGNOSIS — D6869 Other thrombophilia: Secondary | ICD-10-CM | POA: Diagnosis not present

## 2019-11-27 DIAGNOSIS — G4733 Obstructive sleep apnea (adult) (pediatric): Secondary | ICD-10-CM | POA: Diagnosis not present

## 2019-11-27 DIAGNOSIS — Z880 Allergy status to penicillin: Secondary | ICD-10-CM | POA: Insufficient documentation

## 2019-11-27 DIAGNOSIS — Z79899 Other long term (current) drug therapy: Secondary | ICD-10-CM | POA: Insufficient documentation

## 2019-11-27 DIAGNOSIS — Z96641 Presence of right artificial hip joint: Secondary | ICD-10-CM | POA: Insufficient documentation

## 2019-11-27 DIAGNOSIS — Z7901 Long term (current) use of anticoagulants: Secondary | ICD-10-CM | POA: Insufficient documentation

## 2019-11-27 DIAGNOSIS — K219 Gastro-esophageal reflux disease without esophagitis: Secondary | ICD-10-CM | POA: Diagnosis not present

## 2019-11-27 DIAGNOSIS — Z888 Allergy status to other drugs, medicaments and biological substances status: Secondary | ICD-10-CM | POA: Diagnosis not present

## 2019-11-27 NOTE — Progress Notes (Signed)
Primary Care Physician: Tiffany Koch, MD Referring Physician: Dr. [name] Cardiologist: Dr. Brantley Tiffany [name] is a 74 y.o. female with a h/o atrial fibrillation and is referred by Dr Tiffany [name] to Dr. [name] for treatment strategies related to her afib.  She was diagnosed with atrial fibrillation on presentation for cataract surgery.  In retrospect, she thinks that she has had afib for several years prior.  She has [place] for which she is followed by Dr Tiffany [name].  She also has severe sleep apnea for which she uses CPAP.  She has tried amiodarone previously in the setting of metoprolol and had symptomatic bradycardia with syncope.  She had symptoms of palpitations, presyncope, dyspnea with afib. She underwent afib ablation 01/25/17.   On follow up today, patient reports that she has done very well from an afib standpoint. She had one very brief episode of palpitations with no other associated symptoms. The episode resolved within a few minutes. She denies bleeding issues on anticoagulation.   Today, she denies symptoms of palpitations, chest pain, shortness of breath, orthopnea, PND, lower extremity edema, syncope, or neurologic sequela. The patient is tolerating medications without difficulties and is otherwise without complaint today.   Past Medical History:  Diagnosis Date  . Arthritis 04-20-12   Osteoarthritis-right hip  . Atrial fibrillation status post cardioversion, 01/30/15 maintaining SR.  01/31/2015   a. 01/2015 s/p TEE/DCCV;  b. 02/06/2015 recurrent AF noted, amio added;  c. CHA2DS2VASc = 3-->eliquis.  Marland Kitchen BENIGN POSITIONAL VERTIGO   . Cataract   . Complication of anesthesia 04-20-12   some issues with prolonged sedation after anesthesia  . Diverticulosis   . DYSLIPIDEMIA   . Esophageal stricture   . GERD   . HYPERTENSION    a. severe, pt intol of med tx, Pt. has severe "whitecoat" syndrome and refused medical therapy.  . Hypothyroidism 09/29/2014   a. pt did not tolerate  synthroid and this was subsequently discontinued.  . Mild LV dysfunction    a. 01/2015 Echo: EF 45-50%, mild MR, mildly dil LA, mod dil RV with mod to sev reduced fxn, mod-sev TR, PASP 90mmHg.  . Osteopenia   . Raynaud disease   . URINARY INCONTINENCE 04-20-12   occ. with nighttime sleep pattern   Past Surgical History:  Procedure Laterality Date  . ATRIAL FIBRILLATION ABLATION N/A 01/26/2017   Procedure: Atrial Fibrillation Ablation;  Surgeon: Thompson Grayer, MD;  Location: [facility];  Service: Cardiovascular;  Laterality: N/A;  . breast ultrasound Right 09/13/13   There is no sonographic evidence of malignancy. the 2.7OJ complicated cyst in (R) breast is consistent with a benign finding. repeat in 1 year  . CARDIAC CATHETERIZATION N/A 08/29/2015   Procedure: Right Heart Cath;  Surgeon: Larey Dresser, MD;  Location: [facility];  Service: Cardiovascular;  Laterality: N/A;  . CARDIOVERSION N/A 01/30/2015   Procedure: CARDIOVERSION;  Surgeon: Sanda Klein, MD;  Location: [facility];  Service: Cardiovascular;  Laterality: N/A;  . CHOLECYSTECTOMY     '90-"sludge"  . NASAL FRACTURE SURGERY  2006  . RIGHT HEART CATH N/A 06/30/2017   Procedure: RIGHT HEART CATH;  Surgeon: Larey Dresser, MD;  Location: [facility];  Service: Cardiovascular;  Laterality: N/A;  . TEE WITHOUT CARDIOVERSION N/A 01/30/2015   Procedure: TRANSESOPHAGEAL ECHOCARDIOGRAM (TEE);  Surgeon: Sanda Klein, MD;  Location: [facility] ENDOSCOPY;  Service: Cardiovascular;  Laterality: N/A;  . TEE WITHOUT CARDIOVERSION N/A 01/25/2017   Procedure: TRANSESOPHAGEAL ECHOCARDIOGRAM (TEE);  Surgeon: Fay Records, MD;  Location: [facility] ENDOSCOPY;  Service: Cardiovascular;  Laterality: N/A;  . TOTAL HIP ARTHROPLASTY  04/26/2012   Procedure: TOTAL HIP ARTHROPLASTY ANTERIOR APPROACH;  Surgeon: Mauri Pole, MD;  Location: [facility];  Service: Orthopedics;  Laterality: Right;  . TUBAL LIGATION  1980    Current Outpatient  Medications  Medication Sig Dispense Refill  . acetaminophen (TYLENOL) 325 MG tablet Take 2 tablets (650 mg total) by mouth every 4 (four) hours as needed for headache or mild pain.    . Calcium Carb-Cholecalciferol (CALCIUM 600+D) 600-800 MG-UNIT TABS Take 1 tablet by mouth daily.    Marland Kitchen conjugated estrogens (PREMARIN) vaginal cream Place 1 Applicatorful vaginally once a week.    . diazepam (VALIUM) 5 MG tablet TAKE 1 TABLET EVERY 8 HOURS AS NEEDED FOR ANXIETY 30 tablet 2  . ELIQUIS 5 MG TABS tablet TAKE 1 TABLET BY MOUTH TWICE A DAY 180 tablet 1  . famotidine (PEPCID) 20 MG tablet TAKE 1 TABLET BY MOUTH TWICE A DAY 180 tablet 3  . flecainide (TAMBOCOR) 50 MG tablet Take 1 tablet (50 mg total) by mouth 2 (two) times daily. 180 tablet 2  . fluticasone (FLONASE) 50 MCG/ACT nasal spray Place 1 spray daily as needed into both nostrils for allergies or rhinitis.    . furosemide (LASIX) 80 MG tablet Take 1 tablet (80 mg total) by mouth daily. 30 tablet 5  . methimazole (TAPAZOLE) 5 MG tablet Take 1 tablet (5 mg total) by mouth daily. 60 tablet 3  . metoprolol succinate (TOPROL-XL) 25 MG 24 hr tablet TAKE 1 TABLET BY MOUTH EVERY DAY 90 tablet 3  . OPSUMIT 10 MG tablet TAKE 1 TABLET BY MOUTH EVERY DAY 30 tablet 11  . potassium chloride (KLOR-CON) 20 MEQ packet Take 40 mEq by mouth daily. 60 packet 5  . Probiotic Product (PROBIOTIC-10) CAPS Take 1 capsule by mouth 3 (three) times a week.     . Riociguat (ADEMPAS) 2.5 MG TABS Take 2.5 mg by mouth 3 (three) times daily. 90 tablet 11  . Selexipag (UPTRAVI) 1000 MCG TABS Take 1,000 mcg by mouth 2 (two) times daily. 60 tablet 4   No current facility-administered medications for this encounter.    Allergies  Allergen Reactions  . Levothyroxine Shortness Of Breath and Other (See Comments)    Exhaustion also - Per pt her this medication could have not been the reason for her symptoms  . Statins Other (See Comments)    "Pain, weakness and kidney problems"    . Prednisone Other (See Comments)    Severe insomnia  . Penicillins Other (See Comments)    dry mouth Has patient had a PCN reaction causing immediate rash, facial/tongue/throat swelling, SOB or lightheadedness with hypotension: No Has patient had a PCN reaction causing severe rash involving mucus membranes or skin necrosis: No Has patient had a PCN reaction that required hospitalization No Has patient had a PCN reaction occurring within the last 10 years: No If all of the above answers are "NO", then may proceed with Cephalosporin use.     Social History   Socioeconomic History  . Marital status: Married    Spouse name: Not on file  . Number of children: 2  . Years of education: Not on file  . Highest education level: Not on file  Occupational History  . Not on file  Tobacco Use  . Smoking status: Never Smoker  . Smokeless tobacco: Never Used  Substance and Sexual  Activity  . Alcohol use: Yes    Alcohol/week: 2.0 standard drinks    Types: 2 Glasses of wine per week    Comment: several glasses wine weekly  . Drug use: No  . Sexual activity: Yes  Other Topics Concern  . Not on file  Social History Narrative   She enjoys birding.  Lives at home with husband.   Married.   retired Quarry manager, Tax inspector   Social Determinants of Radio broadcast assistant Strain:   . Difficulty of Paying Living Expenses:   Food Insecurity:   . Worried About Charity fundraiser in the Last Year:   . Arboriculturist in the Last Year:   Transportation Needs:   . Film/video editor (Medical):   Marland Kitchen Lack of Transportation (Non-Medical):   Physical Activity:   . Days of Exercise per Week:   . Minutes of Exercise per Session:   Stress:   . Feeling of Stress :   Social Connections:   . Frequency of Communication with Friends and Family:   . Frequency of Social Gatherings with Friends and Family:   . Attends Religious Services:   . Active Member of Clubs or  Organizations:   . Attends Archivist Meetings:   Marland Kitchen Marital Status:   Intimate Partner Violence:   . Fear of Current or Ex-Partner:   . Emotionally Abused:   Marland Kitchen Physically Abused:   . Sexually Abused:     Family History  Adopted: Yes  Problem Relation Age of Onset  . Other Other        patient was adopted    ROS- All systems are reviewed and negative except as per the HPI above  Physical Exam: Vitals:   11/27/19 1040  BP: 120/72  Pulse: (!) 58  Weight: 66.3 kg  Height: 4' 11.25" (1.505 m)   Wt Readings from Last 3 Encounters:  11/27/19 66.3 kg  11/07/19 67 kg  09/14/19 67.1 kg    Labs: Lab Results  Component Value Date   NA 139 11/17/2019   K 3.8 11/17/2019   CL 103 11/17/2019   CO2 24 11/17/2019   GLUCOSE 89 11/17/2019   BUN 31 (H) 11/17/2019   CREATININE 2.59 (H) 11/17/2019   CALCIUM 9.4 11/17/2019   MG 2.5 (H) 04/13/2017   Lab Results  Component Value Date   INR 0.98 04/20/2012   Lab Results  Component Value Date   CHOL 188 04/19/2019   HDL 49.70 04/19/2019   LDLCALC 105 (H) 04/19/2019   TRIG 163.0 (H) 04/19/2019    GEN- The patient is well appearing elderly female, alert and oriented x 3 today.   HEENT-head normocephalic, atraumatic, sclera clear, conjunctiva pink, hearing intact, trachea midline. Lungs- Clear to ausculation bilaterally, normal work of breathing Heart- Regular rate and rhythm, no murmurs, rubs or gallops  GI- soft, NT, ND, + BS Extremities- no clubbing, cyanosis. Non pitting edema. MS- no significant deformity or atrophy Skin- no rash or lesion Psych- euthymic mood, full affect Neuro- strength and sensation are intact   EKG- SB HR 58, inc RBBB, NST, PR 176, QRS 94, QTc 459  Epic records reviewed  Echo 08/17/19 1. Left ventricular ejection fraction, by visual estimation, is 60 to  65%. The left ventricle has normal function. There is no left ventricular  hypertrophy.  2. Left ventricular diastolic function  could not be evaluated.  3. Right ventricular pressure overload.  4. The left  ventricle has no regional wall motion abnormalities.  5. Global right ventricle has normal systolic function.The right  ventricular size is moderately enlarged. No increase in right ventricular  wall thickness.  6. Left atrial size was mildly dilated.  7. Right atrial size was mild-moderately dilated.  8. Mild mitral annular calcification.  9. The mitral valve is degenerative. Mild mitral valve regurgitation.  10. The tricuspid valve is normal in structure. Mild to moderate tricuspid  regurgitation.  11. The aortic valve is tricuspid. Aortic valve regurgitation is mild.  Mild aortic valve sclerosis without stenosis.  12. Severely elevated pulmonary artery systolic pressure.  13. The tricuspid regurgitant velocity is 4.62 m/s, and with an assumed  right atrial pressure of 3 mmHg, the estimated right ventricular systolic  pressure is severely elevated at 88.2 mmHg.  14. The inferior vena cava is normal in size with greater than 50%  respiratory variability, suggesting right atrial pressure of 3 mmHg.    Assessment and Plan: 1. Paroxysmal afib s/p ablation 01/25/17. Patient appears to be maintaining SR. Continue Eliquis 5 mg BID Continue flecainide 50 mg BID  This patients CHA2DS2-VASc Score and unadjusted Ischemic Stroke Rate (% per year) is equal to 3.2 % stroke rate/year from a score of 3  Above score calculated as 1 point each if present [CHF, HTN, DM, Vascular=MI/PAD/Aortic Plaque, Age if 65-74, or Female] Above score calculated as 2 points each if present [Age > 75, or Stroke/TIA/TE]  2. PHT Followed in the [facility]  3. OSA The importance of adequate treatment of sleep apnea was discussed today in order to improve our ability to maintain sinus rhythm long term. Patient reports compliance with CPAP therapy.  4. HTN Stable, no changes today.   Follow up in the AF clinic in 6 months.     [facility] [address] [place], [place] [address] [phone]

## 2019-11-28 ENCOUNTER — Other Ambulatory Visit (HOSPITAL_COMMUNITY): Payer: Self-pay | Admitting: Cardiology

## 2019-12-12 ENCOUNTER — Other Ambulatory Visit: Payer: Self-pay

## 2019-12-14 ENCOUNTER — Other Ambulatory Visit: Payer: Self-pay

## 2019-12-14 ENCOUNTER — Ambulatory Visit: Payer: Medicare PPO | Admitting: Internal Medicine

## 2019-12-14 ENCOUNTER — Encounter: Payer: Self-pay | Admitting: Internal Medicine

## 2019-12-14 VITALS — BP 130/70 | HR 76 | Ht 59.25 in | Wt 145.0 lb

## 2019-12-14 DIAGNOSIS — E041 Nontoxic single thyroid nodule: Secondary | ICD-10-CM | POA: Insufficient documentation

## 2019-12-14 DIAGNOSIS — E0590 Thyrotoxicosis, unspecified without thyrotoxic crisis or storm: Secondary | ICD-10-CM | POA: Diagnosis not present

## 2019-12-14 LAB — T3, FREE: T3, Free: 2.7 pg/mL (ref 2.3–4.2)

## 2019-12-14 LAB — TSH: TSH: 9.61 u[IU]/mL — ABNORMAL HIGH (ref 0.35–4.50)

## 2019-12-14 LAB — T4, FREE: Free T4: 0.72 ng/dL (ref 0.60–1.60)

## 2019-12-14 NOTE — Progress Notes (Signed)
Patient ID: Tiffany [name], female   DOB: 08/31/1945, 74 y.o.   MRN: [id number]   This visit occurred during the SARS-CoV-2 public health emergency.  Safety protocols were in place, including screening questions prior to the visit, additional usage of staff PPE, and extensive cleaning of exam room while observing appropriate contact time as indicated for disinfecting solutions.   HPI  Tiffany [name] is a 74 y.o.-year-old female, initially referred by her cardiologist, Dr. 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  Last visit 3 months ago.  Reviewed and addended history: Patient had fluctuating TFTs since 2016, which she was found to have a slightly high TSH.  She was briefly on levothyroxine at that time.  This was stopped due to development of shortness of breath, however, in retrospect, this could have been due to undiagnosed PAH.    TSH normalized subsequently, but towards the end of 2016, the TSH returned elevated again.  Afterwards, the next TSH was normal in 10/2017 but in 04/2019 she was found to have a suppressed TSH, which was confirmed in 08/2019 along with a high free T4.  She was referred to endocrinology at that point.  Of note, she has a significant history of atrial fibrillation and had to have cardioversion in the past. She also has [place] >> she has SOB chronically.  She was on Amiodarone 3-4 years ago along with metoprolol, but amiodarone was stopped after she had a syncopal episode.  She was on Prednisone >1 year ago for wheezing. She had significant insomnia afterwards.  No recent steroid courses.  At last visit, TFTs were still abnormal.  Her TSI antibodies were slightly elevated.  We checked a thyroid uptake and scan (10/06/2019): normal: Homogeneous tracer distribution in both thyroid lobes. No focal areas of increased or decreased tracer localization. 4 hour I-123 uptake = 6.4% (normal 5-20%) 24 hour I-123 uptake =  29.4% (normal 10-30%)  IMPRESSION: Normal thyroid scan. Normal 4 hour and 24 hour radio iodine uptakes.   Therefore, conclusion was that she may have mild Graves' disease (or, less likely, resolving subacute thyroiditis).   Due to age and history of atrial fibrillation, restarted treatment with methimazole 5 mg daily.  She continues on this today.    Unfortunately, she did not return for labs after the starting medication.  Reviewed patient's TFTs: Lab Results  Component Value Date   TSH 0.09 (L) 09/14/2019   TSH 0.189 (L) 08/08/2019   TSH 0.15 (L) 04/19/2019   TSH 2.66 10/08/2017   TSH 7.81 (H) 07/15/2015   TSH 6.530 (H) 04/15/2015   TSH 1.657 01/28/2015   TSH 1.16 01/11/2015   TSH 5.25 (H) 09/28/2014   TSH 4.62 10/11/2013   FREET4 1.47 09/14/2019   FREET4 1.49 (H) 08/08/2019   FREET4 1.30 04/19/2019   FREET4 1.01 07/15/2015   FREET4 1.22 (H) 04/15/2015   FREET4 1.25 (H) 01/28/2015   FREET4 1.26 01/11/2015   T3FREE 3.3 09/14/2019   T3FREE 3.1 08/08/2019   T3FREE 2.0 04/15/2015   T3FREE 2.7 02/13/2015   Her Graves' antibodies are elevated: Lab Results  Component Value Date   TSI 161 (H) 09/14/2019   Thyroid ultrasound (08/18/2019) showed only one subcentimeter thyroid nodule, not worrisome. Parenchymal Echotexture: Moderately heterogenous Isthmus: 0.5 cm, previously 0.3 cm Right lobe: 4.8 x 1.5 x 1.5 cm, previously 5.0 x 1.9 x 1.6 cm Left lobe: 4.1 x 2.0 x 1.3 cm, previously 4.2 x 2.5 x 1.3 cm  Again  noted are numerous cysts and cystic nodules throughout the thyroid.  Nodule # 2: Prior biopsy: No Location: Right; Inferior Maximum size: 0.9 cm; Other 2 dimensions: 0.5 x 0.6 cm, previously, 1.0 x 0.6 x 0.9 cm Composition: mixed cystic and solid (1) Echogenicity: isoechoic (1) Change in features: Yes; nodule now appears to be mixed cystic and solid composition rather than a solid nodule. This nodule does NOT meet TI-RADS criteria for biopsy or dedicated  follow-up. _____________________________________________________  IMPRESSION: Thyroid tissue is heterogeneous with several small thyroid nodules and cysts.  Dominant nodule is located in the inferior right thyroid lobe. This nodule has mixed cystic and solid composition and measures up to 0.9 cm. Nodule does not meet criteria for biopsy or dedicated follow-up.  Pt denies: - feeling nodules in neck - hoarseness - choking - SOB with lying down  She has chronic dysphagia - strictures 2/2 GERD.  Unknown FH of thyroid ds. As pt was adopted. No h/o radiation tx to head or neck.  No seaweed or kelp. No recent contrast studies. No herbal supplements. No Biotin use. No recent steroids use (prev. Caused insomnia).   Pt. also has a history of GERD, sleep apnea, a hip replacement, had a nose reconstruction sx, cholecystectomy.  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  ROS: Constitutional: no weight gain/no weight loss, no fatigue, no subjective hyperthermia, no subjective hypothermia Eyes: no blurry vision, no xerophthalmia ENT: no sore throat, + see HPI, + hypoacusis (wears hearing aids) Cardiovascular: no CP/+ SOB/+ palpitations (on flecainide)/+ leg swelling Respiratory: no cough/+ SOB/no wheezing Gastrointestinal: no N/no V/no D/no C/+ acid reflux Musculoskeletal: no muscle aches/no joint aches Skin: no rashes, no hair loss Neurological: no tremors/no numbness/no tingling/no dizziness  I reviewed pt's medications, allergies, PMH, social hx, family hx, and changes were documented in the history of present illness. Otherwise, unchanged from my initial visit note.  Past Medical History:  Diagnosis Date  . Arthritis 04-20-12   Osteoarthritis-right hip  . Atrial fibrillation status post cardioversion, 01/30/15 maintaining SR.  01/31/2015   a. 01/2015 s/p TEE/DCCV;  b. 02/06/2015 recurrent AF noted, amio added;  c. CHA2DS2VASc = 3-->eliquis.  Marland Kitchen BENIGN POSITIONAL VERTIGO   .  Cataract   . Complication of anesthesia 04-20-12   some issues with prolonged sedation after anesthesia  . Diverticulosis   . DYSLIPIDEMIA   . Esophageal stricture   . GERD   . HYPERTENSION    a. severe, pt intol of med tx, Pt. has severe "whitecoat" syndrome and refused medical therapy.  . Hypothyroidism 09/29/2014   a. pt did not tolerate synthroid and this was subsequently discontinued.  . Mild LV dysfunction    a. 01/2015 Echo: EF 45-50%, mild MR, mildly dil LA, mod dil RV with mod to sev reduced fxn, mod-sev TR, PASP 58mmHg.  . Osteopenia   . Raynaud disease   . URINARY INCONTINENCE 04-20-12   occ. with nighttime sleep pattern   Past Surgical History:  Procedure Laterality Date  . ATRIAL FIBRILLATION ABLATION N/A 01/26/2017   Procedure: Atrial Fibrillation Ablation;  Surgeon: Thompson Grayer, MD;  Location: [facility];  Service: Cardiovascular;  Laterality: N/A;  . breast ultrasound Right 09/13/13   There is no sonographic evidence of malignancy. the 7.3XT complicated cyst in (R) breast is consistent with a benign finding. repeat in 1 year  . CARDIAC CATHETERIZATION N/A 08/29/2015   Procedure: Right Heart Cath;  Surgeon: Larey Dresser, MD;  Location: [facility];  Service: Cardiovascular;  Laterality: N/A;  . CARDIOVERSION N/A 01/30/2015   Procedure: CARDIOVERSION;  Surgeon: Sanda Klein, MD;  Location: [facility];  Service: Cardiovascular;  Laterality: N/A;  . CHOLECYSTECTOMY     '90-"sludge"  . NASAL FRACTURE SURGERY  2006  . RIGHT HEART CATH N/A 06/30/2017   Procedure: RIGHT HEART CATH;  Surgeon: Larey Dresser, MD;  Location: [facility];  Service: Cardiovascular;  Laterality: N/A;  . TEE WITHOUT CARDIOVERSION N/A 01/30/2015   Procedure: TRANSESOPHAGEAL ECHOCARDIOGRAM (TEE);  Surgeon: Sanda Klein, MD;  Location: [facility];  Service: Cardiovascular;  Laterality: N/A;  . TEE WITHOUT CARDIOVERSION N/A 01/25/2017   Procedure: TRANSESOPHAGEAL  ECHOCARDIOGRAM (TEE);  Surgeon: Fay Records, MD;  [facility];  Service: Cardiovascular;  Laterality: N/A;  . TOTAL HIP ARTHROPLASTY  04/26/2012   Procedure: TOTAL HIP ARTHROPLASTY ANTERIOR APPROACH;  Surgeon: Mauri Pole, MD;  Location: [facility];  Service: Orthopedics;  Laterality: Right;  . TUBAL LIGATION  1980   Social History   Socioeconomic History  . Marital status: Married    Spouse name: Not on file  . Number of children: 2  . Years of education: Not on file  . Highest education level: Not on file  Occupational History  . Not on file  Tobacco Use  . Smoking status: Never Smoker  . Smokeless tobacco: Never Used  Substance and Sexual Activity  . Alcohol use: Yes    Alcohol/week: 2.0 standard drinks    Types: 2 Glasses of wine per week    Comment: several glasses wine weekly  . Drug use: No  . Sexual activity: Yes  Other Topics Concern  . Not on file  Social History Narrative   She enjoys birding.  Lives at home with husband.   Married.   retired Quarry manager, Tax inspector   Social Determinants of Radio broadcast assistant Strain:   . Difficulty of Paying Living Expenses:   Food Insecurity:   . Worried About Charity fundraiser in the Last Year:   . Arboriculturist in the Last Year:   Transportation Needs:   . Film/video editor (Medical):   Marland Kitchen Lack of Transportation (Non-Medical):   Physical Activity:   . Days of Exercise per Week:   . Minutes of Exercise per Session:   Stress:   . Feeling of Stress :   Social Connections:   . Frequency of Communication with Friends and Family:   . Frequency of Social Gatherings with Friends and Family:   . Attends Religious Services:   . Active Member of Clubs or Organizations:   . Attends Archivist Meetings:   Marland Kitchen Marital Status:   Intimate Partner Violence:   . Fear of Current or Ex-Partner:   . Emotionally Abused:   Marland Kitchen Physically Abused:   . Sexually Abused:    Current  Outpatient Medications on File Prior to Visit  Medication Sig Dispense Refill  . acetaminophen (TYLENOL) 325 MG tablet Take 2 tablets (650 mg total) by mouth every 4 (four) hours as needed for headache or mild pain.    . Calcium Carb-Cholecalciferol (CALCIUM 600+D) 600-800 MG-UNIT TABS Take 1 tablet by mouth daily.    Marland Kitchen conjugated estrogens (PREMARIN) vaginal cream Place 1 Applicatorful vaginally once a week.    . diazepam (VALIUM) 5 MG tablet TAKE 1 TABLET EVERY 8 HOURS AS NEEDED FOR ANXIETY 30 tablet 2  . ELIQUIS 5 MG TABS tablet TAKE  1 TABLET BY MOUTH TWICE A DAY 180 tablet 1  . famotidine (PEPCID) 20 MG tablet TAKE 1 TABLET BY MOUTH TWICE A DAY 180 tablet 3  . flecainide (TAMBOCOR) 50 MG tablet Take 1 tablet (50 mg total) by mouth 2 (two) times daily. 180 tablet 2  . fluticasone (FLONASE) 50 MCG/ACT nasal spray Place 1 spray daily as needed into both nostrils for allergies or rhinitis.    . furosemide (LASIX) 80 MG tablet Take 1 tablet (80 mg total) by mouth daily. 30 tablet 5  . KLOR-CON M20 20 MEQ tablet TAKE 2 TABLETS BY MOUTH DAILY 180 tablet 3  . methimazole (TAPAZOLE) 5 MG tablet Take 1 tablet (5 mg total) by mouth daily. 60 tablet 3  . metoprolol succinate (TOPROL-XL) 25 MG 24 hr tablet TAKE 1 TABLET BY MOUTH EVERY DAY 90 tablet 3  . OPSUMIT 10 MG tablet TAKE 1 TABLET BY MOUTH EVERY DAY 30 tablet 11  . potassium chloride (KLOR-CON) 20 MEQ packet Take 40 mEq by mouth daily. 60 packet 5  . Probiotic Product (PROBIOTIC-10) CAPS Take 1 capsule by mouth 3 (three) times a week.     . Riociguat (ADEMPAS) 2.5 MG TABS Take 2.5 mg by mouth 3 (three) times daily. 90 tablet 11  . Selexipag (UPTRAVI) 1000 MCG TABS Take 1,000 mcg by mouth 2 (two) times daily. 60 tablet 4   No current facility-administered medications on file prior to visit.   Allergies  Allergen Reactions  . Levothyroxine Shortness Of Breath and Other (See Comments)    Exhaustion also - Per pt her this medication could have not  been the reason for her symptoms  . Statins Other (See Comments)    "Pain, weakness and kidney problems"  . Prednisone Other (See Comments)    Severe insomnia  . Penicillins Other (See Comments)    dry mouth Has patient had a PCN reaction causing immediate rash, facial/tongue/throat swelling, SOB or lightheadedness with hypotension: No Has patient had a PCN reaction causing severe rash involving mucus membranes or skin necrosis: No Has patient had a PCN reaction that required hospitalization No Has patient had a PCN reaction occurring within the last 10 years: No If all of the above answers are "NO", then may proceed with Cephalosporin use.    Family History  Adopted: Yes  Problem Relation Age of Onset  . Other Other        patient was adopted    PE: BP 130/70   Pulse 76   Ht 4' 11.25" (1.505 m)   Wt 145 lb (65.8 kg)   BMI 29.04 kg/m  Wt Readings from Last 3 Encounters:  12/14/19 145 lb (65.8 kg)  11/27/19 146 lb 3.2 oz (66.3 kg)  11/07/19 147 lb 9.6 oz (67 kg)   Constitutional: normal weight, in NAD Eyes: PERRLA, EOMI, no exophthalmos, no lid lag, no stare ENT: moist mucous membranes, + slight, symmetric, rubbery, thyromegaly, no cervical lymphadenopathy Cardiovascular: RRR, No MRG, + bilateral LE pitting edema Respiratory: CTA B Gastrointestinal: abdomen soft, NT, ND, BS+ Musculoskeletal: no deformities, strength intact in all 4 Skin: moist, warm, no rashes Neurological: no tremor with outstretched hands, DTR normal in all 4  ASSESSMENT: 1. Thyrotoxicosis  2.  Thyroid nodule  PLAN:  1. Patient with a history of hypothyroidism, but with recent development of thyrotoxicosis.  She did not have thyrotoxic symptoms at last visit: No weight loss, heat intolerance, hyper defecation, anxiety.  She does have palpitations, which are chronic, in  the setting of atrial fibrillation and controlled with metoprolol and flecainide.  At last visit she did not appear to have exogenous  for the low TSH.  We checked her TFTs at that time and they were still in the thyrotoxic range.  TSI antibodies were mildly positive, pointing towards the possible diagnosis of mild Graves' disease these antibodies can also be slightly elevated in the setting of subacute thyroiditis, but I believe that this would be less likely for her due to clinical course.  We checked a thyroid uptake and scan and both the uptake in the scan were normal.  We reviewed the results together today.  We started methimazole low-dose, 5 mg daily at last visit.  She did not return in 1.5 months for repeat labs. -At this visit, we will recheck her TSH, free T4, free T3 and change the methimazole dose accordingly -We again discussed about possible treatment for her thyrotoxicosis to include continuing methimazole, RAI treatment, or, last resort, surgery.  For now, we decided to continue methimazole, but I explained that she would need to come back for labs in 1.5 months if we need to change the dose. - We will also continue Toprol-XL and flecainide per cardiology - No signs of Graves' ophthalmopathy: No double vision, blurry vision, eye pain, chemosis - RTC in 4 months, but likely sooner for repeat labs  2.  Thyroid nodule -This is small, subcentimeter, seen on thyroid ultrasound from 08/2019; it is hypoechoic and mixed solid-cystic -She denies neck compression symptoms -We discussed that no intervention or follow-up is needed for this nodule unless she starts developing neck compression symptoms, in which case we need to repeat the ultrasound  Component     Latest Ref Rng & Units 12/14/2019  TSH     0.35 - 4.50 uIU/mL 9.61 (H)  T4,Free(Direct)     0.60 - 1.60 ng/dL 0.72  Triiodothyronine,Free,Serum     2.3 - 4.2 pg/mL 2.7   TSH is now increased.  I will advise her to stop methimazole and recheck her test in 5 weeks.  Philemon Kingdom, MD PhD [facility]

## 2019-12-14 NOTE — Patient Instructions (Signed)
Please continue Methimazole 5 mg daily.  Please stop at the lab.  Please come back for a follow-up appointment in 6 months.

## 2019-12-22 ENCOUNTER — Other Ambulatory Visit: Payer: Self-pay

## 2019-12-22 ENCOUNTER — Ambulatory Visit (HOSPITAL_COMMUNITY)
Admission: RE | Admit: 2019-12-22 | Discharge: 2019-12-22 | Disposition: A | Discharge status: Home / Self Care | Payer: Medicare PPO | Source: Ambulatory Visit | Attending: Cardiology | Admitting: Cardiology

## 2019-12-22 ENCOUNTER — Encounter (HOSPITAL_COMMUNITY): Payer: Self-pay | Admitting: Cardiology

## 2019-12-22 ENCOUNTER — Telehealth (HOSPITAL_COMMUNITY): Payer: Self-pay | Admitting: *Deleted

## 2019-12-22 VITALS — BP 118/62 | HR 61 | Ht 59.25 in | Wt 145.4 lb

## 2019-12-22 DIAGNOSIS — I272 Pulmonary hypertension, unspecified: Secondary | ICD-10-CM

## 2019-12-22 DIAGNOSIS — K219 Gastro-esophageal reflux disease without esophagitis: Secondary | ICD-10-CM | POA: Insufficient documentation

## 2019-12-22 DIAGNOSIS — Z7901 Long term (current) use of anticoagulants: Secondary | ICD-10-CM | POA: Diagnosis not present

## 2019-12-22 DIAGNOSIS — I480 Paroxysmal atrial fibrillation: Secondary | ICD-10-CM | POA: Diagnosis not present

## 2019-12-22 DIAGNOSIS — I5032 Chronic diastolic (congestive) heart failure: Secondary | ICD-10-CM | POA: Diagnosis not present

## 2019-12-22 DIAGNOSIS — G4733 Obstructive sleep apnea (adult) (pediatric): Secondary | ICD-10-CM | POA: Insufficient documentation

## 2019-12-22 DIAGNOSIS — M341 CR(E)ST syndrome: Secondary | ICD-10-CM | POA: Diagnosis not present

## 2019-12-22 DIAGNOSIS — I130 Hypertensive heart and chronic kidney disease with heart failure and stage 1 through stage 4 chronic kidney disease, or unspecified chronic kidney disease: Secondary | ICD-10-CM | POA: Diagnosis not present

## 2019-12-22 DIAGNOSIS — N1832 Chronic kidney disease, stage 3b: Secondary | ICD-10-CM | POA: Diagnosis not present

## 2019-12-22 DIAGNOSIS — I2721 Secondary pulmonary arterial hypertension: Secondary | ICD-10-CM | POA: Insufficient documentation

## 2019-12-22 DIAGNOSIS — Z79899 Other long term (current) drug therapy: Secondary | ICD-10-CM | POA: Insufficient documentation

## 2019-12-22 LAB — BASIC METABOLIC PANEL
Anion gap: 11 (ref 5–15)
BUN: 37 mg/dL — ABNORMAL HIGH (ref 8–23)
CO2: 24 mmol/L (ref 22–32)
Calcium: 9.2 mg/dL (ref 8.9–10.3)
Chloride: 100 mmol/L (ref 98–111)
Creatinine, Ser: 2.92 mg/dL — ABNORMAL HIGH (ref 0.44–1.00)
GFR calc Af Amer: 17 mL/min — ABNORMAL LOW (ref >60)
GFR calc non Af Amer: 15 mL/min — ABNORMAL LOW (ref >60)
Glucose, Bld: 99 mg/dL (ref 70–99)
Potassium: 3.5 mmol/L (ref 3.5–5.1)
Sodium: 135 mmol/L (ref 135–145)

## 2019-12-22 MED ORDER — FUROSEMIDE 40 MG PO TABS: 60.0000 mg | ORAL_TABLET | Freq: Every day | ORAL | Status: DC

## 2019-12-22 MED ORDER — UPTRAVI 1000 MCG PO TABS: 1200.0000 ug | ORAL_TABLET | Freq: Two times a day (BID) | ORAL | 4 refills | Status: DC

## 2019-12-22 NOTE — Patient Instructions (Signed)
INCREASE Uptravi to 1200 mcg twice a day-goal dose is 2400 mcg twice a day This will be increased by your [place] today We will only contact you if something comes back abnormal or we need to make some changes. Otherwise no news is good news!  You have been referred to [facility] Address: [address] [place], [place] [address] Phone: [phone] -they will be in contact for an appointment   Be sure to wear your compression stocking daily, you may remove them at bedtime.  Your physician recommends that you schedule a follow-up appointment in: 3 months with Dr [name]  Do the following things EVERYDAY: 1) Weigh yourself in the morning before breakfast. Write it down and keep it in a log. 2) Take your medicines as prescribed 3) Eat low salt foods--Limit salt (sodium) to 2000 mg per day.  4) Stay as active as you can everyday 5) Limit all fluids for the day to less than 2 liters  At the [facility], you and your health needs are our priority. As part of our continuing mission to provide you with exceptional heart care, we have created designated Provider Care Teams. These Care Teams include your primary Cardiologist (physician) and Advanced Practice Providers (APPs- Physician Assistants and Nurse Practitioners) who all work together to provide you with the care you need, when you need it.   You may see any of the following providers on your designated Care Team at your next follow up: Marland Kitchen Dr Glori Bickers . Dr Loralie Champagne . Darrick Grinder, NP . Lyda Jester, PA . Audry Riles, PharmD   Please be sure to bring in all your medications bottles to every appointment.

## 2019-12-22 NOTE — Telephone Encounter (Signed)
-----   Message from Larey Dresser, MD sent at 12/22/2019  2:42 PM EDT ----- Creatinine higher, hold Lasix for 2 days and decrease back to 60 mg daily.  Need to cut back on sodium intake so she can keep lasix at lower dose.

## 2019-12-22 NOTE — Telephone Encounter (Signed)
Tiffany [name], [place]  12/22/2019 4:11 PM EDT    Pt returned call she is aware of results and medication updated in pts chart.    [name], [place]  7/42/5525 3:06 PM EDT    Left message with gentlemen at home, he will have patient give [place] a callback   Larey Dresser, MD  12/22/2019 2:42 PM EDT    Creatinine higher, hold Lasix for 2 days and decrease back to 60 mg daily. Need to cut back on sodium intake so she can keep lasix at lower dose.

## 2019-12-24 NOTE — Progress Notes (Signed)
Patient ID: Tiffany [name], female   DOB: 1946-02-07, 74 y.o.   MRN: [id number] PCP: Dr. Sharlet Salina HF Cardiology: Dr. [name]  74 y.o. with history of paroxysmal atrial fibrillation and diastolic CHF was noted to have significant pulmonary hypertension and exertional dyspnea.  She has history of paroxysmal atrial fibrillation and had an episode in 6/16 requiring cardioversion.  She was started on amiodarone but this was stopped with worsening breathing.  Cardiolite in 9/16 showed on ischemia or infarction.     I had her do a RHC in 1/17.  This showed elevated left and right heart filling pressures but also PAH. After cath, she increased her Lasix from 40 qod to 40 daily.  At a prior appointment, I increased Lasix to 40 mg bid and also started her on Opsumit.  At next appointment, I added Adcirca 20 and then titrated it up to 40 mg daily. She continue to be volume overloaded, so  I increased Lasix to 80 qam/40 qpm. She has seemed to do well on this dose.   Echo was done in 6/17, RV mildly dilated with moderately decreased systolic function and PASP 85 mmHg.    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   She had an atrial fibrillation ablation in 6/18.   RHC in 11/18 showed lower PA pressure than in the past with PA 54/16 and PVR 3.7 WU.  Echo showed preserved LV systolic function and normal RV size and function.  Holter showed PACs and PVCs, no atrial fibrillation.  I started her on Toprol XL.  CT chest did not show any significant lung findings. Echo in 12/19 showed EF 65-70%, moderate diastolic dysfunction, mild AS, mild MR, PASP 47 mmHg, normal RV.   She was started on flecainide for paroxysmal atrial fibrillation and has not had symptomatic atrial fibrillation for months now.   Echo in 1/21 showed EF 60-65%, moderately dilated RV with normal systolic function, mild-moderate TR with PASP 88 mmHg, IVC normal.   She returns today for followup of diastolic CHF and  pulmonary hypertension. She was recently able to tolerate increase in Uptravi to 1000 mcg bid.  She has dyspnea that waxes and wanes.  No dyspnea walking around her house but notices it when walking longer distances. She takes Lasix 80 mg qam, occasionally she will take a dose of 40 mg Lasix in the evening.  She is now off methimazole, TSH now elevated.  She is able to do light yardwork.  No lightheadedness, palpitations.  No orthopnea/PND.  Weight is down 2 lbs. She is using her CPAP.   6 minute walk (2/17): 141 m 6 minute walk (3/17): 182 m 6 minute walk (5/17): 229 m 6 minute walk (10/17): 305 m 6 minute walk (5/18): 256 m 6 minute walk (8/18): 427 m 6 minute walk (11/18): 61 m, oxygen saturation dropped as low as 70s.  6 minute walk (3/19): 260 m 6 minute walk (6/19): 372 m 6 minute walk (12/19): 317 m 6 minute walk (4/21): 305 m  Labs (12/16): BNP 1187, ANA 1:640, TSH elevated. Labs (2/17): K 5.4 => 3.9, creatinine 1.78 => 1.66, LDL 112, BNP 880 Labs (4/17): K 4.5, creatinine 1.76 Labs (5/17): K 4.1, creatinine 1.79 Labs (8/17): K 3.1, creatinine 1.7, BNP 404 Labs (9/17): K 3.9, creatinine 1.8, BNP 512 Labs (2/18): K 3.5, creatinine 1.71, BNP 157 Labs (5/18): LDL 120 Labs (6/18): K 3.6, creatinine 1.85 Labs (8/18): K 3.5, creatinine 1.78, BNP  88 Labs (11/18): K 3.6 => 3.2, creatinine 1.84 => 1.7, hgb 12.7 => 11.2. Labs (3/19): K 3.8, creatinine 1.74, LDL 123, HDL 56, TSH normal Labs (6/19): K 3.5, creatinine 1.91 Labs (9/19): K 3.5, creatinine 1.9 Labs (1/21): K 3.9, creatinine 2.44 Labs (4/21): K 3.8, creatinine 2.59, BNP 268  PMH:  1. CKD stage III 2. Bradycardia in setting of metoprolol + amiodarone use.  3. Raynauds phenomenon. 4. BPPV 5. OA right hip 6. Hyperthyroidism 7. Atrial fibrillation: Paroxysmal.  She was on amiodarone in the past but this was stopped when she became more short of breath.  TEE-guided DCCV in 6/16.  - Atrial fibrillation ablation 6/18.  -  Holter (12/18) with PACs/PVCs, no atrial fibrillation.  8. HTN 9. 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 - TEE (6/16) with EF 45-50%, mildly dilated RV with mildly decreased systolic function, PASP 36 mmHg.  - RHC (1/17) with mean RA 9, PA 80/29 mean 52, mean PCWP 27, CI 1.97 Fick/1.8 thermo, PVR 7.6 Fick/8.4 thermo.  - Echo (6/17): EF 60-65%, mild MR, mild RV dilation with moderately decreased systolic function, moderate TR, PASP 85 mmHg.  - Echo (6/18): EF 02-58%, normal diastolic function, normal RV size and systolic function, PASP 36 mmHg. - Echo (12/18): EF 60-65%, grade II diastolic dysfunction, mild MR, normal RV size and systolic function, PASP 52 mmHg.  - RHC (11/18): mean RA 2, PA 54/16 mean 32, mean PCWP 9, CI 3.83, PVR 3.7 WU.  - Echo (12/19): EF 65-70%, moderate diastolic dysfunction, mild AS, mild MR, PASP 47 mmHg.  - Echo (1/21): EF 60-65%, moderately dilated RV with normal systolic function, mild-moderate TR with PASP 88 mmHg, IVC normal.  10. Pulmonary hypertension: See RHC above.  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  - TEE (6/16) with mildly dilated RV with mildly decreased systolic function. - ANA 5:277, anti-centromere antibody elevated, anti-SCL-70 antibody negative, h/o Raynauds - PFTs (7/16) with minimal obstructive defect but moderately decreased DLCO.  - V/Q scan (2/17) negative for acute or chronic PE.  11. Cardiolite (9/16) with no ischemia/infarction.  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  13. OSA: Severe on 9/17 sleep study. Now using CPAP.  14. GERD 15. Thyroid nodule: right nodule seen on 3/19 [place].   SH: Married, lives in [place], no smoking, no ETOH.   FH: Adopted.  Daughter has Raynaud's.  ROS: All systems reviewed and negative except as per HPI  Current Outpatient Medications  Medication Sig Dispense  Refill  . acetaminophen (TYLENOL) 325 MG tablet Take 2 tablets (650 mg total) by mouth every 4 (four) hours as needed for headache or mild pain.    . Calcium Carb-Cholecalciferol (CALCIUM 600+D) 600-800 MG-UNIT TABS Take 1 tablet by mouth daily.    . diazepam (VALIUM) 5 MG tablet TAKE 1 TABLET EVERY 8 HOURS AS NEEDED FOR ANXIETY 30 tablet 2  . ELIQUIS 5 MG TABS tablet TAKE 1 TABLET BY MOUTH TWICE A DAY 180 tablet 1  . famotidine (PEPCID) 20 MG tablet TAKE 1 TABLET BY MOUTH TWICE A DAY 180 tablet 3  . flecainide (TAMBOCOR) 50 MG tablet Take 1 tablet (50 mg total) by mouth 2 (two) times daily. 180 tablet 2  . fluticasone (FLONASE) 50 MCG/ACT nasal spray Place 1 spray daily as needed into both nostrils for allergies or rhinitis.    Marland Kitchen  KLOR-CON M20 20 MEQ tablet TAKE 2 TABLETS BY MOUTH DAILY 180 tablet 3  . metoprolol succinate (TOPROL-XL) 25 MG 24 hr tablet TAKE 1 TABLET BY MOUTH EVERY DAY 90 tablet 3  . OPSUMIT 10 MG tablet TAKE 1 TABLET BY MOUTH EVERY DAY 30 tablet 11  . potassium chloride (KLOR-CON) 20 MEQ packet Take 40 mEq by mouth daily. 60 packet 5  . Probiotic Product (PROBIOTIC-10) CAPS Take 1 capsule by mouth 3 (three) times a week.     . Riociguat (ADEMPAS) 2.5 MG TABS Take 2.5 mg by mouth 3 (three) times daily. 90 tablet 11  . Selexipag (UPTRAVI) 1000 MCG TABS Take 1,200 mcg by mouth 2 (two) times daily. 60 tablet 4  . furosemide (LASIX) 40 MG tablet Take 1.5 tablets (60 mg total) by mouth daily.     No current facility-administered medications for this encounter.   BP 118/62   Pulse 61   Ht 4' 11.25" (1.505 m)   Wt 66 kg (145 lb 6.4 oz)   SpO2 94%   BMI 29.12 kg/m   General: NAD Neck: JVP 8 cm, no thyromegaly or thyroid nodule.  Lungs: Clear to auscultation bilaterally with normal respiratory effort. CV: Nondisplaced PMI.  Heart regular S1/S2, no S3/S4, 2/6 early SEM RUSB.  Trace ankle edema bilaterally.   No carotid bruit.  Normal pedal pulses.  Abdomen: Soft, nontender, no  hepatosplenomegaly, no distention.  Skin: Intact without lesions or rashes.  Neurologic: Alert and oriented x 3.  Psych: Normal affect. Extremities: No clubbing or cyanosis.  HEENT: Normal.   Assessment/Plan: 1. Pulmonary hypertension: Patient has pulmonary arterial hypertension.  She appears to have co-existing diastolic LV dysfunction given elevated PCWP on prior RHC in 1/17.  PFTs did not show significant obstruction, they only showed moderately decreased DLCO consistent with pulmonary vascular disease. V/Q scan did not show evidence for chronic PE.  PVR 7.6 WU by Fick and 8.4 WU by thermodiluation on 1/17 RHC.  Cardiac index was low, 1.97 Fick/1.8 thermo. 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  Given worsening symptoms, RHC was repeated in 11/18.  This showed PVR 3.7 WU with mean PA pressure 32 and normal CI.  Most recent echo in 1/21 showed moderately enlarged RV with normal systolic function, PASP 88 mmHg.  NYHA class II-III symptoms.  She is not significantly volume overloaded on exam today.  - Continue riociguat 2.5 mg tid.  - She has sleep apnea, now using CPAP.  - Continue Opsumit.  - She has tolerated increase in Uptravi to 1000 mcg bid. I will try to continue slowly increasing Uptravi up to as high as 2400 mcg bid.     - 6 minute walk done at home (without mask) was slightly less than in the past).  2. Chronic diastolic CHF/RV failure: Elevated PCWP on 1/17 RHC.  Last echo in 1/21 with EF 60-65%. NYHA class II-III symptoms. She is not volume overloaded on exam.  - Continue Lasix 80 mg daily with BMET today.  3. Atrial fibrillation: Paroxysmal.  She is in NSR today.  She had atrial fibrillation ablation in 6/18. She is now on flecainide.  - Continue Toprol XL 25 mg daily.  - Continue apixaban - Continue flecainide.   4. CKD: Stage 3b. ?If CREST syndrome plays a role.  She is now seeing nephrology.  - BMET today.  5. CREST syndrome: Seen remotely by Dr. Charlestine Night, who has now retired.  I will refer her for rheumatology followup to Dr. Amil Amen.   Followup in 3 months.   Loralie Champagne 12/24/2019

## 2019-12-30 ENCOUNTER — Other Ambulatory Visit: Payer: Self-pay | Admitting: Physician Assistant

## 2019-12-30 ENCOUNTER — Telehealth: Payer: Self-pay | Admitting: Physician Assistant

## 2019-12-30 DIAGNOSIS — I272 Pulmonary hypertension, unspecified: Secondary | ICD-10-CM

## 2019-12-30 MED ORDER — UPTRAVI 1000 MCG PO TABS: 1200.0000 ug | ORAL_TABLET | Freq: Two times a day (BID) | ORAL | 4 refills | Status: DC

## 2019-12-30 NOTE — Telephone Encounter (Signed)
Returned call to patient who is very angry about having a lapse in her [place]. I sent a refill to [facility] listed in Epic and asked her to follow up with them when they reopen after the holiday weekend. She has an emergency supply for one week. She would like for me to notify Dr. [name]. I called [facility] and they are unable to order it for her.

## 2019-12-31 NOTE — Telephone Encounter (Signed)
Please make sure that she gets her Uptravi ordered.

## 2020-01-02 ENCOUNTER — Telehealth (HOSPITAL_COMMUNITY): Payer: Self-pay | Admitting: Pharmacist

## 2020-01-02 ENCOUNTER — Telehealth (HOSPITAL_COMMUNITY): Payer: Self-pay

## 2020-01-02 MED ORDER — UPTRAVI TITRATION 200 & 800 MCG PO TBPK: ORAL_TABLET | ORAL | 11 refills | Status: DC

## 2020-01-02 NOTE — Telephone Encounter (Signed)
Sent in [place] prescription to [facility].  Audry Riles, PharmD, BCPS, BCCP, CPP Heart Failure Clinic Pharmacist [phone]

## 2020-01-02 NOTE — Telephone Encounter (Signed)
Received a fax from [facility] stating that the patient has been scheduled to see Dr. Leigh Aurora on 03/18/2020 at 1:30pm.    CB# [phone] ext. [phone](Jennifer, Referral Coordinator)

## 2020-01-03 ENCOUNTER — Telehealth (HOSPITAL_COMMUNITY): Payer: Self-pay | Admitting: Pharmacy Technician

## 2020-01-03 NOTE — Telephone Encounter (Signed)
Called [facility] to confirm that new Uptravi prescription was received. They have the prescription, it is not ready to run through insurance.Representative advised to call back tomorrow. It should be ready to run through insurance at that time.  Of note, patient was given an emergency supply which will last her until the end of the week. Will follow up.

## 2020-01-03 NOTE — Telephone Encounter (Signed)
[facility] called back. The patient does not want to up titrate right now and has no plans to in the near future. [facility] spoke with Ander Purpura, [facility] who okayed 1225mcg twice daily.  Charlann Boxer, CPhT

## 2020-01-04 NOTE — Telephone Encounter (Signed)
Malvin Johns was sent in by Clinic Pharmacist, Audry Riles

## 2020-01-17 DIAGNOSIS — Z1231 Encounter for screening mammogram for malignant neoplasm of breast: Secondary | ICD-10-CM | POA: Diagnosis not present

## 2020-01-17 DIAGNOSIS — N184 Chronic kidney disease, stage 4 (severe): Secondary | ICD-10-CM | POA: Diagnosis not present

## 2020-01-19 ENCOUNTER — Other Ambulatory Visit (INDEPENDENT_AMBULATORY_CARE_PROVIDER_SITE_OTHER): Payer: Medicare PPO

## 2020-01-19 DIAGNOSIS — E0590 Thyrotoxicosis, unspecified without thyrotoxic crisis or storm: Secondary | ICD-10-CM

## 2020-01-19 LAB — TSH: TSH: 0.21 u[IU]/mL — ABNORMAL LOW (ref 0.35–4.50)

## 2020-01-19 LAB — T4, FREE: Free T4: 1.33 ng/dL (ref 0.60–1.60)

## 2020-01-19 LAB — T3, FREE: T3, Free: 3.2 pg/mL (ref 2.3–4.2)

## 2020-01-23 DIAGNOSIS — I270 Primary pulmonary hypertension: Secondary | ICD-10-CM | POA: Diagnosis not present

## 2020-01-23 DIAGNOSIS — N184 Chronic kidney disease, stage 4 (severe): Secondary | ICD-10-CM | POA: Diagnosis not present

## 2020-01-23 DIAGNOSIS — I5030 Unspecified diastolic (congestive) heart failure: Secondary | ICD-10-CM | POA: Diagnosis not present

## 2020-01-23 DIAGNOSIS — M341 CR(E)ST syndrome: Secondary | ICD-10-CM | POA: Diagnosis not present

## 2020-01-24 ENCOUNTER — Other Ambulatory Visit: Payer: Self-pay | Admitting: Internal Medicine

## 2020-01-24 DIAGNOSIS — E0590 Thyrotoxicosis, unspecified without thyrotoxic crisis or storm: Secondary | ICD-10-CM

## 2020-01-24 MED ORDER — METHIMAZOLE 5 MG PO TABS: 2.5000 mg | ORAL_TABLET | Freq: Every day | ORAL | 3 refills | Status: DC

## 2020-02-29 ENCOUNTER — Other Ambulatory Visit: Payer: Self-pay

## 2020-02-29 ENCOUNTER — Other Ambulatory Visit (INDEPENDENT_AMBULATORY_CARE_PROVIDER_SITE_OTHER): Payer: Medicare PPO

## 2020-02-29 DIAGNOSIS — E0590 Thyrotoxicosis, unspecified without thyrotoxic crisis or storm: Secondary | ICD-10-CM | POA: Diagnosis not present

## 2020-02-29 LAB — TSH: TSH: 2.9 u[IU]/mL (ref 0.35–4.50)

## 2020-02-29 LAB — T3, FREE: T3, Free: 2.4 pg/mL (ref 2.3–4.2)

## 2020-02-29 LAB — T4, FREE: Free T4: 0.97 ng/dL (ref 0.60–1.60)

## 2020-03-05 DIAGNOSIS — H43393 Other vitreous opacities, bilateral: Secondary | ICD-10-CM | POA: Diagnosis not present

## 2020-03-05 DIAGNOSIS — H26491 Other secondary cataract, right eye: Secondary | ICD-10-CM | POA: Diagnosis not present

## 2020-03-05 DIAGNOSIS — Z961 Presence of intraocular lens: Secondary | ICD-10-CM | POA: Diagnosis not present

## 2020-03-05 DIAGNOSIS — H524 Presbyopia: Secondary | ICD-10-CM | POA: Diagnosis not present

## 2020-03-05 DIAGNOSIS — H04123 Dry eye syndrome of bilateral lacrimal glands: Secondary | ICD-10-CM | POA: Diagnosis not present

## 2020-03-05 DIAGNOSIS — H5213 Myopia, bilateral: Secondary | ICD-10-CM | POA: Diagnosis not present

## 2020-03-18 DIAGNOSIS — N1832 Chronic kidney disease, stage 3b: Secondary | ICD-10-CM | POA: Diagnosis not present

## 2020-03-18 DIAGNOSIS — I2721 Secondary pulmonary arterial hypertension: Secondary | ICD-10-CM | POA: Diagnosis not present

## 2020-03-18 DIAGNOSIS — D8989 Other specified disorders involving the immune mechanism, not elsewhere classified: Secondary | ICD-10-CM | POA: Diagnosis not present

## 2020-03-18 DIAGNOSIS — E663 Overweight: Secondary | ICD-10-CM | POA: Diagnosis not present

## 2020-03-18 DIAGNOSIS — Z6827 Body mass index (BMI) 27.0-27.9, adult: Secondary | ICD-10-CM | POA: Diagnosis not present

## 2020-04-17 ENCOUNTER — Other Ambulatory Visit: Payer: Self-pay

## 2020-04-17 ENCOUNTER — Ambulatory Visit (HOSPITAL_COMMUNITY)
Admission: RE | Admit: 2020-04-17 | Discharge: 2020-04-17 | Disposition: A | Discharge status: Home / Self Care | Payer: Medicare PPO | Source: Ambulatory Visit | Attending: Cardiology | Admitting: Cardiology

## 2020-04-17 VITALS — BP 117/78 | HR 62 | Wt 140.0 lb

## 2020-04-17 DIAGNOSIS — Z7901 Long term (current) use of anticoagulants: Secondary | ICD-10-CM | POA: Insufficient documentation

## 2020-04-17 DIAGNOSIS — K219 Gastro-esophageal reflux disease without esophagitis: Secondary | ICD-10-CM | POA: Diagnosis not present

## 2020-04-17 DIAGNOSIS — M341 CR(E)ST syndrome: Secondary | ICD-10-CM | POA: Diagnosis not present

## 2020-04-17 DIAGNOSIS — E0590 Thyrotoxicosis, unspecified without thyrotoxic crisis or storm: Secondary | ICD-10-CM | POA: Insufficient documentation

## 2020-04-17 DIAGNOSIS — I5032 Chronic diastolic (congestive) heart failure: Secondary | ICD-10-CM | POA: Insufficient documentation

## 2020-04-17 DIAGNOSIS — G4730 Sleep apnea, unspecified: Secondary | ICD-10-CM | POA: Insufficient documentation

## 2020-04-17 DIAGNOSIS — Z79899 Other long term (current) drug therapy: Secondary | ICD-10-CM | POA: Insufficient documentation

## 2020-04-17 DIAGNOSIS — I480 Paroxysmal atrial fibrillation: Secondary | ICD-10-CM | POA: Insufficient documentation

## 2020-04-17 DIAGNOSIS — I130 Hypertensive heart and chronic kidney disease with heart failure and stage 1 through stage 4 chronic kidney disease, or unspecified chronic kidney disease: Secondary | ICD-10-CM | POA: Insufficient documentation

## 2020-04-17 DIAGNOSIS — I272 Pulmonary hypertension, unspecified: Secondary | ICD-10-CM | POA: Diagnosis not present

## 2020-04-17 DIAGNOSIS — I2721 Secondary pulmonary arterial hypertension: Secondary | ICD-10-CM | POA: Insufficient documentation

## 2020-04-17 DIAGNOSIS — N183 Chronic kidney disease, stage 3 unspecified: Secondary | ICD-10-CM | POA: Diagnosis not present

## 2020-04-17 LAB — BASIC METABOLIC PANEL
Anion gap: 11 (ref 5–15)
BUN: 35 mg/dL — ABNORMAL HIGH (ref 8–23)
CO2: 23 mmol/L (ref 22–32)
Calcium: 9.2 mg/dL (ref 8.9–10.3)
Chloride: 103 mmol/L (ref 98–111)
Creatinine, Ser: 2.71 mg/dL — ABNORMAL HIGH (ref 0.44–1.00)
GFR calc Af Amer: 19 mL/min — ABNORMAL LOW (ref >60)
GFR calc non Af Amer: 16 mL/min — ABNORMAL LOW (ref >60)
Glucose, Bld: 89 mg/dL (ref 70–99)
Potassium: 4.4 mmol/L (ref 3.5–5.1)
Sodium: 137 mmol/L (ref 135–145)

## 2020-04-17 LAB — CBC
HCT: 35.1 % — ABNORMAL LOW (ref 36.0–46.0)
Hemoglobin: 11 g/dL — ABNORMAL LOW (ref 12.0–15.0)
MCH: 27 pg (ref 26.0–34.0)
MCHC: 31.3 g/dL (ref 30.0–36.0)
MCV: 86.2 fL (ref 80.0–100.0)
Platelets: 187 10*3/uL (ref 150–400)
RBC: 4.07 MIL/uL (ref 3.87–5.11)
RDW: 18.6 % — ABNORMAL HIGH (ref 11.5–15.5)
WBC: 4.5 10*3/uL (ref 4.0–10.5)
nRBC: 0 % (ref 0.0–0.2)

## 2020-04-17 MED ORDER — POTASSIUM CHLORIDE ER 10 MEQ PO TBCR: 40.0000 meq | EXTENDED_RELEASE_TABLET | Freq: Every day | ORAL | 3 refills | Status: DC

## 2020-04-17 NOTE — Patient Instructions (Signed)
Your Potassium prescription was changed from 20 meq tablets to 10 meq tablets so you will now have to take 4 tabs daily to equal your dose of 40 meq daily  We will have [facility] reach out to you to increase your [place] dose  Your physician recommends that you schedule a follow-up appointment in: 3 months  If you have any questions or concerns before your next appointment please send [place] a message through [place] or call our office at [phone].    TO LEAVE A MESSAGE FOR THE NURSE SELECT OPTION 2, PLEASE LEAVE A MESSAGE INCLUDING: . YOUR NAME . DATE OF BIRTH . CALL BACK NUMBER . REASON FOR CALL**this is important as we prioritize the call backs  YOU WILL RECEIVE A CALL BACK THE SAME DAY AS LONG AS YOU CALL BEFORE 4:00 PM

## 2020-04-17 NOTE — Progress Notes (Signed)
Patient ID: Tiffany [name], female   DOB: May 10, 1946, 74 y.o.   MRN: [id number] PCP: Dr. Sharlet Salina HF Cardiology: Dr. [name]  74 y.o. with history of paroxysmal atrial fibrillation and diastolic CHF was noted to have significant pulmonary hypertension and exertional dyspnea.  She has history of paroxysmal atrial fibrillation and had an episode in 6/16 requiring cardioversion.  She was started on amiodarone but this was stopped with worsening breathing.  Cardiolite in 9/16 showed on ischemia or infarction.     I had her do a RHC in 1/17.  This showed elevated left and right heart filling pressures but also PAH. After cath, she increased her Lasix from 40 qod to 40 daily.  At a prior appointment, I increased Lasix to 40 mg bid and also started her on Opsumit.  At next appointment, I added Adcirca 20 and then titrated it up to 40 mg daily. She continue to be volume overloaded, so  I increased Lasix to 80 qam/40 qpm. She has seemed to do well on this dose.   Echo was done in 6/17, RV mildly dilated with moderately decreased systolic function and PASP 85 mmHg.    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   She had an atrial fibrillation ablation in 6/18.   RHC in 11/18 showed lower PA pressure than in the past with PA 54/16 and PVR 3.7 WU.  Echo showed preserved LV systolic function and normal RV size and function.  Holter showed PACs and PVCs, no atrial fibrillation.  I started her on Toprol XL.  CT chest did not show any significant lung findings. Echo in 12/19 showed EF 65-70%, moderate diastolic dysfunction, mild AS, mild MR, PASP 47 mmHg, normal RV.   She was started on flecainide for paroxysmal atrial fibrillation and has not had symptomatic atrial fibrillation for months now.   Echo in 1/21 showed EF 60-65%, moderately dilated RV with normal systolic function, mild-moderate TR with PASP 88 mmHg, IVC normal.   She returns today for followup of diastolic CHF and  pulmonary hypertension. Weight is down 5 lbs.  She is using her CPAP.  She saw Dr Amil Amen for evaluation of CREST syndrome.  She is back on a low dose of methimazole.  She has "good days and bad days."  Generally no dyspnea walking on flat ground.  Occasionally gets mild dyspnea walking up a flight of stairs. No orthopnea/PND.  No lightheadedness/syncope.  No chest pain.    ECG (personally reviewed): NSR, anterior and inferior TWIs  6 minute walk (2/17): 141 m 6 minute walk (3/17): 182 m 6 minute walk (5/17): 229 m 6 minute walk (10/17): 305 m 6 minute walk (5/18): 256 m 6 minute walk (8/18): 427 m 6 minute walk (11/18): 61 m, oxygen saturation dropped as low as 70s.  6 minute walk (3/19): 260 m 6 minute walk (6/19): 372 m 6 minute walk (12/19): 317 m 6 minute walk (4/21): 305 m  Labs (12/16): BNP 1187, ANA 1:640, TSH elevated. Labs (2/17): K 5.4 => 3.9, creatinine 1.78 => 1.66, LDL 112, BNP 880 Labs (4/17): K 4.5, creatinine 1.76 Labs (5/17): K 4.1, creatinine 1.79 Labs (8/17): K 3.1, creatinine 1.7, BNP 404 Labs (9/17): K 3.9, creatinine 1.8, BNP 512 Labs (2/18): K 3.5, creatinine 1.71, BNP 157 Labs (5/18): LDL 120 Labs (6/18): K 3.6, creatinine 1.85 Labs (8/18): K 3.5, creatinine 1.78, BNP 88 Labs (11/18): K 3.6 => 3.2, creatinine 1.84 => 1.7, hgb  12.7 => 11.2. Labs (3/19): K 3.8, creatinine 1.74, LDL 123, HDL 56, TSH normal Labs (6/19): K 3.5, creatinine 1.91 Labs (9/19): K 3.5, creatinine 1.9 Labs (1/21): K 3.9, creatinine 2.44 Labs (4/21): K 3.8, creatinine 2.59, BNP 268 Labs (5/21): K 3.5, creatinine 2.9  PMH:  1. CKD stage III 2. Bradycardia in setting of metoprolol + amiodarone use.  3. Raynauds phenomenon. 4. BPPV 5. OA right hip 6. Hyperthyroidism 7. Atrial fibrillation: Paroxysmal.  She was on amiodarone in the past but this was stopped when she became more short of breath.  TEE-guided DCCV in 6/16.  - Atrial fibrillation ablation 6/18.  - Holter (12/18) with  PACs/PVCs, no atrial fibrillation.  8. HTN 9. 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 - TEE (6/16) with EF 45-50%, mildly dilated RV with mildly decreased systolic function, PASP 36 mmHg.  - RHC (1/17) with mean RA 9, PA 80/29 mean 52, mean PCWP 27, CI 1.97 Fick/1.8 thermo, PVR 7.6 Fick/8.4 thermo.  - Echo (6/17): EF 60-65%, mild MR, mild RV dilation with moderately decreased systolic function, moderate TR, PASP 85 mmHg.  - Echo (6/18): EF 32-99%, normal diastolic function, normal RV size and systolic function, PASP 36 mmHg. - Echo (12/18): EF 60-65%, grade II diastolic dysfunction, mild MR, normal RV size and systolic function, PASP 52 mmHg.  - RHC (11/18): mean RA 2, PA 54/16 mean 32, mean PCWP 9, CI 3.83, PVR 3.7 WU.  - Echo (12/19): EF 65-70%, moderate diastolic dysfunction, mild AS, mild MR, PASP 47 mmHg.  - Echo (1/21): EF 60-65%, moderately dilated RV with normal systolic function, mild-moderate TR with PASP 88 mmHg, IVC normal.  10. Pulmonary hypertension: See RHC above.  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  - TEE (6/16) with mildly dilated RV with mildly decreased systolic function. - ANA 2:426, anti-centromere antibody elevated, anti-SCL-70 antibody negative, h/o Raynauds - PFTs (7/16) with minimal obstructive defect but moderately decreased DLCO.  - V/Q scan (2/17) negative for acute or chronic PE.  11. Cardiolite (9/16) with no ischemia/infarction.  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  13. OSA: Severe on 9/17 sleep study. Now using CPAP.  14. GERD 15. Thyroid nodule: right nodule seen on 3/19 [place].   SH: Married, lives in [place], no smoking, no ETOH.   FH: Adopted.  Daughter has Raynaud's.  ROS: All systems reviewed and negative except as per HPI  Current Outpatient Medications  Medication Sig Dispense Refill  .  acetaminophen (TYLENOL) 325 MG tablet Take 2 tablets (650 mg total) by mouth every 4 (four) hours as needed for headache or mild pain.    . Calcium Carb-Cholecalciferol (CALCIUM 600+D) 600-800 MG-UNIT TABS Take 1 tablet by mouth daily.    Marland Kitchen ELIQUIS 5 MG TABS tablet TAKE 1 TABLET BY MOUTH TWICE A DAY 180 tablet 1  . famotidine (PEPCID) 20 MG tablet TAKE 1 TABLET BY MOUTH TWICE A DAY 180 tablet 3  . flecainide (TAMBOCOR) 50 MG tablet Take 1 tablet (50 mg total) by mouth 2 (two) times daily. 180 tablet 2  . fluticasone (FLONASE) 50 MCG/ACT nasal spray Place 1 spray daily as needed into both nostrils for allergies or rhinitis.    . furosemide (LASIX) 40 MG tablet Take 1.5 tablets (60 mg total) by mouth daily.    . Melatonin 2.5 MG CAPS Take 2.5 mg  by mouth as needed.    . methimazole (TAPAZOLE) 5 MG tablet Take 0.5 tablets (2.5 mg total) by mouth daily. 45 tablet 3  . metoprolol succinate (TOPROL-XL) 25 MG 24 hr tablet TAKE 1 TABLET BY MOUTH EVERY DAY 90 tablet 3  . OPSUMIT 10 MG tablet TAKE 1 TABLET BY MOUTH EVERY DAY 30 tablet 11  . Probiotic Product (PROBIOTIC-10) CAPS Take 1 capsule by mouth 3 (three) times a week.     . Riociguat (ADEMPAS) 2.5 MG TABS Take 2.5 mg by mouth 3 (three) times daily. 90 tablet 11  . Selexipag (UPTRAVI) 200 & 800 MCG TBPK Take 1200 mcg by mouth twice daily. Uptitrate by 200 mcg BID as tolerated up to 2400 mcg BID. 300 each 11  . potassium chloride (KLOR-CON) 10 MEQ tablet Take 4 tablets (40 mEq total) by mouth daily. 120 tablet 3   No current facility-administered medications for this encounter.   BP 117/78   Pulse 62   Wt 63.5 kg (140 lb)   SpO2 94%   BMI 28.04 kg/m   General: NAD Neck: No JVD, no thyromegaly or thyroid nodule.  Lungs: Clear to auscultation bilaterally with normal respiratory effort. CV: Nondisplaced PMI.  Heart regular S1/S2, no S3/S4, no murmur.  Trace ankle edema.  No carotid bruit.  Normal pedal pulses.  Abdomen: Soft, nontender, no  hepatosplenomegaly, no distention.  Skin: Intact without lesions or rashes.  Neurologic: Alert and oriented x 3.  Psych: Normal affect. Extremities: No clubbing or cyanosis.  HEENT: Normal.   Assessment/Plan: 1. Pulmonary hypertension: Patient has pulmonary arterial hypertension.  She appears to have co-existing diastolic LV dysfunction given elevated PCWP on prior RHC in 1/17.  PFTs did not show significant obstruction, they only showed moderately decreased DLCO consistent with pulmonary vascular disease. V/Q scan did not show evidence for chronic PE.  PVR 7.6 WU by Fick and 8.4 WU by thermodiluation on 1/17 RHC.  Cardiac index was low, 1.97 Fick/1.8 thermo. 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  Given worsening symptoms, RHC was repeated in 11/18.  This showed PVR 3.7 WU with mean PA pressure 32 and normal CI.  Most recent echo in 1/21 showed moderately enlarged RV with normal systolic function, PASP 88 mmHg.  NYHA class II-III symptoms.  She is not significantly volume overloaded on exam today.  - Continue riociguat 2.5 mg tid.  - She has sleep apnea, now using CPAP.  - Continue Opsumit.  - She has tolerated increase in Uptravi to 1200 mcg bid. I will try to continue slowly increasing Uptravi up to as high as 2400 mcg bid => she will increase to 1400 mcg bid today, and I will have the Uptravi nurse work titration after that.  - She will do a 6 minute walk at home where she can take off her mask. She will call in results. 2. Chronic diastolic CHF/RV failure: Elevated PCWP on 1/17 RHC.  Last echo in 1/21 with EF 60-65%. NYHA class II-III symptoms. She is not volume overloaded on exam.  Creatinine has been higher recently, Lasix was cut back to 60 mg daily.  - Continue Lasix 60 mg daily with BMET today.  3. Atrial fibrillation:  Paroxysmal.  She is in NSR today.  She had atrial fibrillation ablation in 6/18. She is now on flecainide.  - Continue Toprol XL 25  mg daily.  - Continue apixaban - Continue flecainide.  4. CKD: Stage 3b. ?If CREST syndrome plays a role.  She is now seeing nephrology.  - BMET today.  5. CREST syndrome: Seeing Dr. Amil Amen.   Followup in 3 months.   Loralie Champagne 04/17/2020

## 2020-04-19 DIAGNOSIS — N184 Chronic kidney disease, stage 4 (severe): Secondary | ICD-10-CM | POA: Diagnosis not present

## 2020-04-23 ENCOUNTER — Other Ambulatory Visit (HOSPITAL_COMMUNITY): Payer: Self-pay | Admitting: *Deleted

## 2020-04-25 ENCOUNTER — Telehealth (HOSPITAL_COMMUNITY): Payer: Self-pay | Admitting: Pharmacist

## 2020-04-25 DIAGNOSIS — N184 Chronic kidney disease, stage 4 (severe): Secondary | ICD-10-CM | POA: Diagnosis not present

## 2020-04-25 DIAGNOSIS — M341 CR(E)ST syndrome: Secondary | ICD-10-CM | POA: Diagnosis not present

## 2020-04-25 DIAGNOSIS — I5030 Unspecified diastolic (congestive) heart failure: Secondary | ICD-10-CM | POA: Diagnosis not present

## 2020-04-25 DIAGNOSIS — I270 Primary pulmonary hypertension: Secondary | ICD-10-CM | POA: Diagnosis not present

## 2020-04-25 NOTE — Telephone Encounter (Signed)
Patient Advocate Encounter   Received notification from [facility] that prior authorization for Tiffany [name] is required.   PA submitted on CoverMyMeds Key [id number]  Status is pending   Will continue to follow.   Audry Riles, PharmD, BCPS, BCCP, CPP Heart Failure Clinic Pharmacist [phone]

## 2020-04-26 NOTE — Telephone Encounter (Signed)
Advanced Heart Failure Patient Advocate Encounter  Prior Authorization for Malvin Johns has been approved.    Effective dates: 04/26/20 through 08/02/20  Audry Riles, PharmD, BCPS, BCCP, CPP Heart Failure Clinic Pharmacist [phone]

## 2020-04-29 ENCOUNTER — Other Ambulatory Visit (HOSPITAL_COMMUNITY): Payer: Self-pay | Admitting: *Deleted

## 2020-05-14 ENCOUNTER — Other Ambulatory Visit: Payer: Self-pay | Admitting: Cardiovascular Disease

## 2020-05-21 ENCOUNTER — Other Ambulatory Visit (HOSPITAL_COMMUNITY): Payer: Self-pay | Admitting: *Deleted

## 2020-05-21 MED ORDER — UPTRAVI TITRATION 200 & 800 MCG PO TBPK: ORAL_TABLET | ORAL | 11 refills | Status: DC

## 2020-05-23 ENCOUNTER — Other Ambulatory Visit (HOSPITAL_COMMUNITY): Payer: Self-pay | Admitting: *Deleted

## 2020-05-28 ENCOUNTER — Ambulatory Visit (HOSPITAL_COMMUNITY)
Admission: RE | Admit: 2020-05-28 | Discharge: 2020-05-28 | Disposition: A | Discharge status: Home / Self Care | Payer: Medicare PPO | Source: Ambulatory Visit | Attending: Physician Assistant | Admitting: Physician Assistant

## 2020-05-28 ENCOUNTER — Encounter (HOSPITAL_COMMUNITY): Payer: Self-pay | Admitting: Physician Assistant

## 2020-05-28 ENCOUNTER — Other Ambulatory Visit: Payer: Self-pay

## 2020-05-28 VITALS — BP 124/76 | HR 60 | Ht 60.0 in | Wt 141.0 lb

## 2020-05-28 DIAGNOSIS — Z79899 Other long term (current) drug therapy: Secondary | ICD-10-CM | POA: Diagnosis not present

## 2020-05-28 DIAGNOSIS — I4891 Unspecified atrial fibrillation: Secondary | ICD-10-CM | POA: Diagnosis not present

## 2020-05-28 DIAGNOSIS — G4733 Obstructive sleep apnea (adult) (pediatric): Secondary | ICD-10-CM | POA: Insufficient documentation

## 2020-05-28 DIAGNOSIS — D6869 Other thrombophilia: Secondary | ICD-10-CM | POA: Diagnosis not present

## 2020-05-28 DIAGNOSIS — Z7901 Long term (current) use of anticoagulants: Secondary | ICD-10-CM | POA: Diagnosis not present

## 2020-05-28 DIAGNOSIS — I10 Essential (primary) hypertension: Secondary | ICD-10-CM | POA: Diagnosis not present

## 2020-05-28 DIAGNOSIS — Z9989 Dependence on other enabling machines and devices: Secondary | ICD-10-CM | POA: Insufficient documentation

## 2020-05-28 DIAGNOSIS — I480 Paroxysmal atrial fibrillation: Secondary | ICD-10-CM | POA: Diagnosis not present

## 2020-05-28 DIAGNOSIS — I2721 Secondary pulmonary arterial hypertension: Secondary | ICD-10-CM | POA: Diagnosis not present

## 2020-05-28 NOTE — Addendum Note (Signed)
Encounter addended by: Juluis Mire, RN on: 05/28/2020 11:54 AM  Actions taken: Order list changed

## 2020-05-28 NOTE — Progress Notes (Signed)
Primary Care Physician: [name], MD Referring Physician: Dr. [name] Cardiologist: Dr. Brantley Persons is a 74 y.o. female with a h/o atrial fibrillation and is referred by Dr [name] to Dr. [name] for treatment strategies related to her afib.  She was diagnosed with atrial fibrillation on presentation for cataract surgery.  In retrospect, she thinks that she has had afib for several years prior.  She has K[place]aid for which she is followed by Dr [name].  She also has severe sleep apnea for which she uses CPAP.  She has tried amiodarone previously in the setting of metoprolol and had symptomatic bradycardia with syncope.  She had symptoms of palpitations, presyncope, dyspnea with afib. She underwent afib ablation 01/25/17.   On follow up today, patient reports she has done well since her last visit. She does become SOB with exertion which has been chronic for her. She denies any heart racing or palpitations. She denies bleeding issues with anticoagulation.   Today, she denies symptoms of palpitations, chest pain, shortness of breath, orthopnea, PND, lower extremity edema, syncope, or neurologic sequela. The patient is tolerating medications without difficulties and is otherwise without complaint today.   Past Medical History:  Diagnosis Date  . Arthritis 04-20-12   Osteoarthritis-right hip  . Atrial fibrillation status post cardioversion, 01/30/15 maintaining SR.  01/31/2015   a. 01/2015 s/p TEE/DCCV;  b. 02/06/2015 recurrent AF noted, amio added;  c. CHA2DS2VASc = 3-->eliquis.  Marland Kitchen BENIGN POSITIONAL VERTIGO   . Cataract   . Complication of anesthesia 04-20-12   some issues with prolonged sedation after anesthesia  . Diverticulosis   . DYSLIPIDEMIA   . Esophageal stricture   . GERD   . HYPERTENSION    a. severe, pt intol of med tx, Pt. has severe "whitecoat" syndrome and refused medical therapy.  . Hypothyroidism 09/29/2014   a. pt did not tolerate synthroid and this was  subsequently discontinued.  . Mild LV dysfunction    a. 01/2015 Echo: EF 45-50%, mild MR, mildly dil LA, mod dil RV with mod to sev reduced fxn, mod-sev TR, PASP 44mmHg.  . Osteopenia   . Raynaud disease   . URINARY INCONTINENCE 04-20-12   occ. with nighttime sleep pattern   Past Surgical History:  Procedure Laterality Date  . ATRIAL FIBRILLATION ABLATION N/A 01/26/2017   Procedure: Atrial Fibrillation Ablation;  Surgeon: Thompson Grayer, MD;  Location: [place] [facility];  Service: Cardiovascular;  Laterality: N/A;  . breast ultrasound Right 09/13/13   There is no sonographic evidence of malignancy. the 2.4MP complicated cyst in (R) breast is consistent with a benign finding. repeat in 1 year  . CARDIAC CATHETERIZATION N/A 08/29/2015   Procedure: Right Heart Cath;  Surgeon: Larey Dresser, MD;  Location: [place] [facility];  Service: Cardiovascular;  Laterality: N/A;  . CARDIOVERSION N/A 01/30/2015   Procedure: CARDIOVERSION;  Surgeon: Sanda Klein, MD;  Location: [facility];  Service: Cardiovascular;  Laterality: N/A;  . CHOLECYSTECTOMY     '90-"sludge"  . NASAL FRACTURE SURGERY  2006  . RIGHT HEART CATH N/A 06/30/2017   Procedure: RIGHT HEART CATH;  Surgeon: Larey Dresser, MD;  Location: [place] [facility];  Service: Cardiovascular;  Laterality: N/A;  . TEE WITHOUT CARDIOVERSION N/A 01/30/2015   Procedure: TRANSESOPHAGEAL ECHOCARDIOGRAM (TEE);  Surgeon: Sanda Klein, MD;  Location: [facility] ENDOSCOPY;  Service: Cardiovascular;  Laterality: N/A;  . TEE WITHOUT CARDIOVERSION N/A 01/25/2017   Procedure: TRANSESOPHAGEAL ECHOCARDIOGRAM (TEE);  Surgeon: Harrington Challenger,  Carmin Muskrat, MD;  Location: [facility];  Service: Cardiovascular;  Laterality: N/A;  . TOTAL HIP ARTHROPLASTY  04/26/2012   Procedure: TOTAL HIP ARTHROPLASTY ANTERIOR APPROACH;  Surgeon: Mauri Pole, MD;  Location: [facility];  Service: Orthopedics;  Laterality: Right;  . TUBAL LIGATION  1980    Current Outpatient Medications  Medication Sig  Dispense Refill  . acetaminophen (TYLENOL) 325 MG tablet Take 2 tablets (650 mg total) by mouth every 4 (four) hours as needed for headache or mild pain.    . Calcium Carb-Cholecalciferol (CALCIUM 600+D) 600-800 MG-UNIT TABS Take 1 tablet by mouth daily.    Marland Kitchen ELIQUIS 5 MG TABS tablet TAKE 1 TABLET BY MOUTH TWICE A DAY 180 tablet 1  . famotidine (PEPCID) 20 MG tablet TAKE 1 TABLET BY MOUTH TWICE A DAY 180 tablet 3  . flecainide (TAMBOCOR) 50 MG tablet Take 1 tablet (50 mg total) by mouth 2 (two) times daily. 180 tablet 2  . fluticasone (FLONASE) 50 MCG/ACT nasal spray Place 1 spray daily as needed into both nostrils for allergies or rhinitis.    . furosemide (LASIX) 40 MG tablet Take 1.5 tablets (60 mg total) by mouth daily.    . Melatonin 2.5 MG CAPS Take 2.5 mg by mouth as needed.    . methimazole (TAPAZOLE) 5 MG tablet Take 0.5 tablets (2.5 mg total) by mouth daily. 45 tablet 3  . metoprolol succinate (TOPROL-XL) 25 MG 24 hr tablet TAKE 1 TABLET BY MOUTH EVERY DAY 90 tablet 3  . OPSUMIT 10 MG tablet TAKE 1 TABLET BY MOUTH EVERY DAY 30 tablet 11  . potassium chloride (KLOR-CON) 10 MEQ tablet Take 4 tablets (40 mEq total) by mouth daily. 120 tablet 3  . Probiotic Product (PROBIOTIC-10) CAPS Take 1 capsule by mouth 3 (three) times a week.     . Riociguat (ADEMPAS) 2.5 MG TABS Take 2.5 mg by mouth 3 (three) times daily. 90 tablet 11  . Selexipag (UPTRAVI) 200 & 800 MCG TBPK Take 1400 mcg by mouth twice daily. Uptitrate by 200 mcg BID as tolerated up to 2400 mcg BID. 300 each 11   No current facility-administered medications for this encounter.    Allergies  Allergen Reactions  . Levothyroxine Shortness Of Breath and Other (See Comments)    Exhaustion also - Per pt her this medication could have not been the reason for her symptoms  . Statins Other (See Comments)    "Pain, weakness and kidney problems"  . Prednisone Other (See Comments)    Severe insomnia  . Penicillins Other (See Comments)     dry mouth Has patient had a PCN reaction causing immediate rash, facial/tongue/throat swelling, SOB or lightheadedness with hypotension: No Has patient had a PCN reaction causing severe rash involving mucus membranes or skin necrosis: No Has patient had a PCN reaction that required hospitalization No Has patient had a PCN reaction occurring within the last 10 years: No If all of the above answers are "NO", then may proceed with Cephalosporin use.     Social History   Socioeconomic History  . Marital status: Married    Spouse name: Not on file  . Number of children: 2  . Years of education: Not on file  . Highest education level: Not on file  Occupational History  . Not on file  Tobacco Use  . Smoking status: Never Smoker  . Smokeless tobacco: Never Used  Vaping Use  . Vaping Use: Never used  Substance and Sexual Activity  .  Alcohol use: Yes    Alcohol/week: 2.0 standard drinks    Types: 2 Glasses of wine per week    Comment: several glasses wine weekly  . Drug use: No  . Sexual activity: Yes  Other Topics Concern  . Not on file  Social History Narrative   She enjoys birding.  Lives at home with husband.   Married.   retired Quarry manager, Tax inspector   Social Determinants of Radio broadcast assistant Strain:   . Difficulty of Paying Living Expenses: Not on file  Food Insecurity:   . Worried About Charity fundraiser in the Last Year: Not on file  . Ran Out of Food in the Last Year: Not on file  Transportation Needs:   . Lack of Transportation (Medical): Not on file  . Lack of Transportation (Non-Medical): Not on file  Physical Activity:   . Days of Exercise per Week: Not on file  . Minutes of Exercise per Session: Not on file  Stress:   . Feeling of Stress : Not on file  Social Connections:   . Frequency of Communication with Friends and Family: Not on file  . Frequency of Social Gatherings with Friends and Family: Not on file  .  Attends Religious Services: Not on file  . Active Member of Clubs or Organizations: Not on file  . Attends Archivist Meetings: Not on file  . Marital Status: Not on file  Intimate Partner Violence:   . Fear of Current or Ex-Partner: Not on file  . Emotionally Abused: Not on file  . Physically Abused: Not on file  . Sexually Abused: Not on file    Family History  Adopted: Yes  Problem Relation Age of Onset  . Other Other        patient was adopted    ROS- All systems are reviewed and negative except as per the HPI above  Physical Exam: Vitals:   05/28/20 1033  BP: 124/76  Pulse: 60  Weight: 64 kg  Height: 5' (1.524 m)   Wt Readings from Last 3 Encounters:  05/28/20 64 kg  04/17/20 63.5 kg  12/22/19 66 kg    Labs: Lab Results  Component Value Date   NA 137 04/17/2020   K 4.4 04/17/2020   CL 103 04/17/2020   CO2 23 04/17/2020   GLUCOSE 89 04/17/2020   BUN 35 (H) 04/17/2020   CREATININE 2.71 (H) 04/17/2020   CALCIUM 9.2 04/17/2020   MG 2.5 (H) 04/13/2017   Lab Results  Component Value Date   INR 0.98 04/20/2012   Lab Results  Component Value Date   CHOL 188 04/19/2019   HDL 49.70 04/19/2019   LDLCALC 105 (H) 04/19/2019   TRIG 163.0 (H) 04/19/2019    GEN- The patient is well appearing elderly female, alert and oriented x 3 today.   HEENT-head normocephalic, atraumatic, sclera clear, conjunctiva pink, hearing intact, trachea midline. Lungs- Clear to ausculation bilaterally, normal work of breathing Heart- Regular rate and rhythm, no murmurs, rubs or gallops  GI- soft, NT, ND, + BS Extremities- no clubbing, cyanosis, or edema MS- no significant deformity or atrophy Skin- no rash or lesion Psych- euthymic mood, full affect Neuro- strength and sensation are intact   EKG- SR HR 60, TWI baseline, PR 174, QRS 88, QTc 422  Epic records reviewed  Echo 08/17/19 1. Left ventricular ejection fraction, by visual estimation, is 60 to  65%. The left  ventricle has  normal function. There is no left ventricular  hypertrophy.  2. Left ventricular diastolic function could not be evaluated.  3. Right ventricular pressure overload.  4. The left ventricle has no regional wall motion abnormalities.  5. Global right ventricle has normal systolic function.The right  ventricular size is moderately enlarged. No increase in right ventricular  wall thickness.  6. Left atrial size was mildly dilated.  7. Right atrial size was mild-moderately dilated.  8. Mild mitral annular calcification.  9. The mitral valve is degenerative. Mild mitral valve regurgitation.  10. The tricuspid valve is normal in structure. Mild to moderate tricuspid  regurgitation.  11. The aortic valve is tricuspid. Aortic valve regurgitation is mild.  Mild aortic valve sclerosis without stenosis.  12. Severely elevated pulmonary artery systolic pressure.  13. The tricuspid regurgitant velocity is 4.62 m/s, and with an assumed  right atrial pressure of 3 mmHg, the estimated right ventricular systolic  pressure is severely elevated at 88.2 mmHg.  14. The inferior vena cava is normal in size with greater than 50%  respiratory variability, suggesting right atrial pressure of 3 mmHg.   CHA2DS2-VASc Score = 4  The patient's score is based upon: CHF History: 0 HTN History: 1 Diabetes History: 0 Stroke History: 0 Vascular Disease History: 0 Age Score: 2 Gender Score: 1      ASSESSMENT AND PLAN: 1. Paroxysmal Atrial Fibrillation (ICD10:  I48.0) The patient's CHA2DS2-VASc score is 4, indicating a 4.8% annual risk of stroke.   S/p ablation 01/25/17 Patient appears to be maintaining SR. Continue Eliquis 5 mg BID Continue flecainide 50 mg BID Continue Toprol 25 mg daily  2. Secondary Hypercoagulable State (ICD10:  D68.69) The patient is at significant risk for stroke/thromboembolism based upon her CHA2DS2-VASc Score of 4.  Continue Apixaban (Eliquis).   3. Pulm HTN  Followed in the [facility]  4. OSA Patient reports compliance with CPAP therapy.   5. HTN Stable, no changes today.   Follow up with Dr [name] as scheduled. Dr [name] in 6 months. AF clinic in one year.   [facility] [address] [place], [place] [address] [phone]

## 2020-06-04 ENCOUNTER — Other Ambulatory Visit (HOSPITAL_COMMUNITY): Payer: Self-pay | Admitting: Cardiology

## 2020-06-18 ENCOUNTER — Other Ambulatory Visit: Payer: Self-pay

## 2020-06-18 ENCOUNTER — Ambulatory Visit (INDEPENDENT_AMBULATORY_CARE_PROVIDER_SITE_OTHER): Payer: Medicare PPO | Admitting: Internal Medicine

## 2020-06-18 ENCOUNTER — Encounter: Payer: Self-pay | Admitting: Internal Medicine

## 2020-06-18 VITALS — BP 118/72 | HR 67 | Temp 97.7°F | Ht 60.0 in | Wt 138.0 lb

## 2020-06-18 DIAGNOSIS — N184 Chronic kidney disease, stage 4 (severe): Secondary | ICD-10-CM

## 2020-06-18 DIAGNOSIS — I5032 Chronic diastolic (congestive) heart failure: Secondary | ICD-10-CM | POA: Diagnosis not present

## 2020-06-18 DIAGNOSIS — Z0000 Encounter for general adult medical examination without abnormal findings: Secondary | ICD-10-CM | POA: Diagnosis not present

## 2020-06-18 DIAGNOSIS — M341 CR(E)ST syndrome: Secondary | ICD-10-CM | POA: Diagnosis not present

## 2020-06-18 DIAGNOSIS — E038 Other specified hypothyroidism: Secondary | ICD-10-CM | POA: Diagnosis not present

## 2020-06-18 NOTE — Patient Instructions (Signed)
Health Maintenance, Female Adopting a healthy lifestyle and getting preventive care are important in promoting health and wellness. Ask your health care provider about:  The right schedule for you to have regular tests and exams.  Things you can do on your own to prevent diseases and keep yourself healthy. What should I know about diet, weight, and exercise? Eat a healthy diet   Eat a diet that includes plenty of vegetables, fruits, low-fat dairy products, and lean protein.  Do not eat a lot of foods that are high in solid fats, added sugars, or sodium. Maintain a healthy weight Body mass index (BMI) is used to identify weight problems. It estimates body fat based on height and weight. Your health care provider can help determine your BMI and help you achieve or maintain a healthy weight. Get regular exercise Get regular exercise. This is one of the most important things you can do for your health. Most adults should:  Exercise for at least 150 minutes each week. The exercise should increase your heart rate and make you sweat (moderate-intensity exercise).  Do strengthening exercises at least twice a week. This is in addition to the moderate-intensity exercise.  Spend less time sitting. Even light physical activity can be beneficial. Watch cholesterol and blood lipids Have your blood tested for lipids and cholesterol at 76 years of age, then have this test every 5 years. Have your cholesterol levels checked more often if:  Your lipid or cholesterol levels are high.  You are older than 76 years of age.  You are at high risk for heart disease. What should I know about cancer screening? Depending on your health history and family history, you may need to have cancer screening at various ages. This may include screening for:  Breast cancer.  Cervical cancer.  Colorectal cancer.  Skin cancer.  Lung cancer. What should I know about heart disease, diabetes, and high blood  pressure? Blood pressure and heart disease  High blood pressure causes heart disease and increases the risk of stroke. This is more likely to develop in people who have high blood pressure readings, are of African descent, or are overweight.  Have your blood pressure checked: ? Every 3-5 years if you are 18-39 years of age. ? Every year if you are 40 years old or older. Diabetes Have regular diabetes screenings. This checks your fasting blood sugar level. Have the screening done:  Once every three years after age 40 if you are at a normal weight and have a low risk for diabetes.  More often and at a younger age if you are overweight or have a high risk for diabetes. What should I know about preventing infection? Hepatitis B If you have a higher risk for hepatitis B, you should be screened for this virus. Talk with your health care provider to find out if you are at risk for hepatitis B infection. Hepatitis C Testing is recommended for:  Everyone born from 1945 through 1965.  Anyone with known risk factors for hepatitis C. Sexually transmitted infections (STIs)  Get screened for STIs, including gonorrhea and chlamydia, if: ? You are sexually active and are younger than 76 years of age. ? You are older than 76 years of age and your health care provider tells you that you are at risk for this type of infection. ? Your sexual activity has changed since you were last screened, and you are at increased risk for chlamydia or gonorrhea. Ask your health care provider if   you are at risk.  Ask your health care provider about whether you are at high risk for HIV. Your health care provider may recommend a prescription medicine to help prevent HIV infection. If you choose to take medicine to prevent HIV, you should first get tested for HIV. You should then be tested every 3 months for as long as you are taking the medicine. Pregnancy  If you are about to stop having your period (premenopausal) and  you may become pregnant, seek counseling before you get pregnant.  Take 400 to 800 micrograms (mcg) of folic acid every day if you become pregnant.  Ask for birth control (contraception) if you want to prevent pregnancy. Osteoporosis and menopause Osteoporosis is a disease in which the bones lose minerals and strength with aging. This can result in bone fractures. If you are 65 years old or older, or if you are at risk for osteoporosis and fractures, ask your health care provider if you should:  Be screened for bone loss.  Take a calcium or vitamin D supplement to lower your risk of fractures.  Be given hormone replacement therapy (HRT) to treat symptoms of menopause. Follow these instructions at home: Lifestyle  Do not use any products that contain nicotine or tobacco, such as cigarettes, e-cigarettes, and chewing tobacco. If you need help quitting, ask your health care provider.  Do not use street drugs.  Do not share needles.  Ask your health care provider for help if you need support or information about quitting drugs. Alcohol use  Do not drink alcohol if: ? Your health care provider tells you not to drink. ? You are pregnant, may be pregnant, or are planning to become pregnant.  If you drink alcohol: ? Limit how much you use to 0-1 drink a day. ? Limit intake if you are breastfeeding.  Be aware of how much alcohol is in your drink. In the [place] one drink equals one 12 oz bottle of beer (355 mL), one 5 oz glass of wine (148 mL), or one 1 oz glass of hard liquor (44 mL). General instructions  Schedule regular health, dental, and eye exams.  Stay current with your vaccines.  Tell your health care provider if: ? You often feel depressed. ? You have ever been abused or do not feel safe at home. Summary  Adopting a healthy lifestyle and getting preventive care are important in promoting health and wellness.  Follow your health care provider's instructions about healthy  diet, exercising, and getting tested or screened for diseases.  Follow your health care provider's instructions on monitoring your cholesterol and blood pressure. This information is not intended to replace advice given to you by your health care provider. Make sure you discuss any questions you have with your health care provider. Document Revised: 07/13/2018 Document Reviewed: 07/13/2018 Elsevier Patient Education  2020 [employer]  

## 2020-06-18 NOTE — Progress Notes (Signed)
Subjective:   Patient ID: Tiffany [name], female    DOB: 17-Aug-1945, 74 y.o.   MRN: [id number]  HPI Here for medicare wellness and physical, no new complaints. Please see A/P for status and treatment of chronic medical problems.   Diet: heart healthy Physical activity: sedentary walks short distances some Depression/mood screen: negative Hearing: intact to whispered voice Visual acuity: grossly normal with lens, performs annual eye exam  ADLs: capable Fall risk: none Home safety: good Cognitive evaluation: intact to orientation, naming, recall and repetition EOL planning: adv directives discussed    Office Visit from 06/18/2020 in [place] at [facility] Total Score 0      I have personally reviewed and have noted 1. The patient's medical and social history - reviewed today no changes 2. Their use of alcohol, tobacco or illicit drugs 3. Their current medications and supplements 4. The patient's functional ability including ADL's, fall risks, home safety risks and hearing or visual impairment. 5. Diet and physical activities 6. Evidence for depression or mood disorders 7. Care team reviewed and updated  Patient Care Team: [name], MD as PCP - General (Internal Medicine) Thornell Sartorius, MD as Consulting Physician (Otolaryngology) Zonia Kief, MD (Rehabilitation) Keene Breath., MD (Ophthalmology) Minus Breeding, MD (Cardiology) Paralee Cancel, MD (Orthopedic Surgery) Irene Shipper, MD (Gastroenterology) Darleen Crocker, MD (Ophthalmology) Past Medical History:  Diagnosis Date  . Arthritis 04-20-12   Osteoarthritis-right hip  . Atrial fibrillation status post cardioversion, 01/30/15 maintaining SR.  01/31/2015   a. 01/2015 s/p TEE/DCCV;  b. 02/06/2015 recurrent AF noted, amio added;  c. CHA2DS2VASc = 3-->eliquis.  Marland Kitchen BENIGN POSITIONAL VERTIGO   . Cataract   . Complication of anesthesia 04-20-12   some issues with prolonged sedation after  anesthesia  . Diverticulosis   . DYSLIPIDEMIA   . Esophageal stricture   . GERD   . HYPERTENSION    a. severe, pt intol of med tx, Pt. has severe "whitecoat" syndrome and refused medical therapy.  . Hypothyroidism 09/29/2014   a. pt did not tolerate synthroid and this was subsequently discontinued.  . Mild LV dysfunction    a. 01/2015 Echo: EF 45-50%, mild MR, mildly dil LA, mod dil RV with mod to sev reduced fxn, mod-sev TR, PASP 21mmHg.  . Osteopenia   . Raynaud disease   . URINARY INCONTINENCE 04-20-12   occ. with nighttime sleep pattern   Past Surgical History:  Procedure Laterality Date  . ATRIAL FIBRILLATION ABLATION N/A 01/26/2017   Procedure: Atrial Fibrillation Ablation;  Surgeon: Thompson Grayer, MD;  Location: [place] [facility];  Service: Cardiovascular;  Laterality: N/A;  . breast ultrasound Right 09/13/13   There is no sonographic evidence of malignancy. the 7.6HY complicated cyst in (R) breast is consistent with a benign finding. repeat in 1 year  . CARDIAC CATHETERIZATION N/A 08/29/2015   Procedure: Right Heart Cath;  Surgeon: Larey Dresser, MD;  Location: [place] [facility];  Service: Cardiovascular;  Laterality: N/A;  . CARDIOVERSION N/A 01/30/2015   Procedure: CARDIOVERSION;  Surgeon: Sanda Klein, MD;  Location: [facility];  Service: Cardiovascular;  Laterality: N/A;  . CHOLECYSTECTOMY     '90-"sludge"  . NASAL FRACTURE SURGERY  2006  . RIGHT HEART CATH N/A 06/30/2017   Procedure: RIGHT HEART CATH;  Surgeon: Larey Dresser, MD;  Location: [place] [facility];  Service: Cardiovascular;  Laterality: N/A;  . TEE WITHOUT CARDIOVERSION N/A 01/30/2015   Procedure: TRANSESOPHAGEAL ECHOCARDIOGRAM (TEE);  Surgeon: Sanda Klein, MD;  Location: [facility] ENDOSCOPY;  Service: Cardiovascular;  Laterality: N/A;  . TEE WITHOUT CARDIOVERSION N/A 01/25/2017   Procedure: TRANSESOPHAGEAL ECHOCARDIOGRAM (TEE);  Surgeon: Fay Records, MD;  Location: [facility] ENDOSCOPY;  Service: Cardiovascular;   Laterality: N/A;  . TOTAL HIP ARTHROPLASTY  04/26/2012   Procedure: TOTAL HIP ARTHROPLASTY ANTERIOR APPROACH;  Surgeon: Mauri Pole, MD;  Location: [facility];  Service: Orthopedics;  Laterality: Right;  . TUBAL LIGATION  1980   Family History  Adopted: Yes  Problem Relation Age of Onset  . Other Other        patient was adopted     Review of Systems  Constitutional: Positive for activity change and fatigue.  HENT: Negative.   Eyes: Negative.   Respiratory: Positive for shortness of breath. Negative for cough and chest tightness.   Cardiovascular: Negative for chest pain, palpitations and leg swelling.  Gastrointestinal: Negative for abdominal distention, abdominal pain, constipation, diarrhea, nausea and vomiting.  Musculoskeletal: Negative.   Skin: Negative.   Neurological: Negative.   Psychiatric/Behavioral: Negative.     Objective:  Physical Exam Constitutional:      Appearance: She is well-developed.  HENT:     Head: Normocephalic and atraumatic.  Cardiovascular:     Rate and Rhythm: Normal rate and regular rhythm.  Pulmonary:     Effort: Pulmonary effort is normal. No respiratory distress.     Breath sounds: Normal breath sounds. No wheezing or rales.  Abdominal:     General: Bowel sounds are normal. There is no distension.     Palpations: Abdomen is soft.     Tenderness: There is no abdominal tenderness. There is no rebound.  Musculoskeletal:     Cervical back: Normal range of motion.  Skin:    General: Skin is warm and dry.  Neurological:     Mental Status: She is alert and oriented to person, place, and time.     Coordination: Coordination normal.     Vitals:   06/18/20 1301  BP: 118/72  Pulse: 67  Temp: 97.7 F (36.5 C)  TempSrc: Oral  Weight: 138 lb (62.6 kg)  Height: 5' (1.524 m)    This visit occurred during the SARS-CoV-2 public health emergency.  Safety protocols were in place, including screening questions prior to the visit, additional  usage of staff PPE, and extensive cleaning of exam room while observing appropriate contact time as indicated for disinfecting solutions.   Assessment & Plan:

## 2020-06-19 ENCOUNTER — Ambulatory Visit: Payer: Medicare PPO | Admitting: Internal Medicine

## 2020-06-19 ENCOUNTER — Encounter: Payer: Self-pay | Admitting: Internal Medicine

## 2020-06-19 VITALS — BP 118/60 | HR 63 | Ht 60.0 in | Wt 140.2 lb

## 2020-06-19 DIAGNOSIS — E041 Nontoxic single thyroid nodule: Secondary | ICD-10-CM | POA: Diagnosis not present

## 2020-06-19 DIAGNOSIS — E0500 Thyrotoxicosis with diffuse goiter without thyrotoxic crisis or storm: Secondary | ICD-10-CM

## 2020-06-19 LAB — T3, FREE: T3, Free: 3 pg/mL (ref 2.3–4.2)

## 2020-06-19 LAB — TSH: TSH: 3.66 u[IU]/mL (ref 0.35–4.50)

## 2020-06-19 LAB — T4, FREE: Free T4: 1.05 ng/dL (ref 0.60–1.60)

## 2020-06-19 NOTE — Progress Notes (Signed)
Patient ID: LOUCINDA [name], female   DOB: 06/08/1946, 74 y.o.   MRN: [id number]   This visit occurred during the SARS-CoV-2 public health emergency.  Safety protocols were in place, including screening questions prior to the visit, additional usage of staff PPE, and extensive cleaning of exam room while observing appropriate contact time as indicated for disinfecting solutions.   HPI  Tiffany [name] is a 74 y.o.-year-old female, initially referred by her cardiologist, Dr. 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  Last visit 6 months ago.  Reviewed and addended history: Patient had fluctuating TFTs since 2016, which she was found to have a slightly high TSH.  She was briefly on levothyroxine at that time.  This was stopped due to development of shortness of breath, however, in retrospect, this could have been due to undiagnosed PAH.    TSH normalized subsequently, but towards the end of 2016, the TSH returned elevated again.  Afterwards, the next TSH was normal in 10/2017 but in 04/2019 she was found to have a suppressed TSH, which was confirmed in 08/2019 along with a high free T4.  She was referred to endocrinology at that point.  Of note, she has a significant history of atrial fibrillation and had to have cardioversion in the past. She also has PAH >>  she has chronic shortness of breath.  She was on Amiodarone in 2017-2018 years ago along with metoprolol, but amiodarone was stopped after she had a syncopal episode.  She was on Prednisone in the past for wheezing. She had significant insomnia afterwards.    We checked a thyroid uptake and scan (10/06/2019): normal: Homogeneous tracer distribution in both thyroid lobes. No focal areas of increased or decreased tracer localization. 4 hour I-123 uptake = 6.4% (normal 5-20%) 24 hour I-123 uptake = 29.4% (normal 10-30%)  IMPRESSION: Normal thyroid scan. Normal 4 hour and 24 hour radio  iodine uptakes.   She also had mildly elevated TSI's >> mild Graves' disease (or, less likely, resolving subacute thyroiditis).   09/2019: started MMI 5 mg daily - did not return for repeat labs...   12/2019: stopped MMI 2/2 elevated TSH   01/2020: Restarted MMI at 2.5 mg daily 2/2 suppressed TSH   02/2020: normal TFTs - continued  the above dose  Reviewed her TFTs: Lab Results  Component Value Date   TSH 2.90 02/29/2020   TSH 0.21 (L) 01/19/2020   TSH 9.61 (H) 12/14/2019   TSH 0.09 (L) 09/14/2019   TSH 0.189 (L) 08/08/2019   TSH 0.15 (L) 04/19/2019   TSH 2.66 10/08/2017   TSH 7.81 (H) 07/15/2015   TSH 6.530 (H) 04/15/2015   TSH 1.657 01/28/2015   FREET4 0.97 02/29/2020   FREET4 1.33 01/19/2020   FREET4 0.72 12/14/2019   FREET4 1.47 09/14/2019   FREET4 1.49 (H) 08/08/2019   FREET4 1.30 04/19/2019   FREET4 1.01 07/15/2015   FREET4 1.22 (H) 04/15/2015   FREET4 1.25 (H) 01/28/2015   FREET4 1.26 01/11/2015   T3FREE 2.4 02/29/2020   T3FREE 3.2 01/19/2020   T3FREE 2.7 12/14/2019   T3FREE 3.3 09/14/2019   T3FREE 3.1 08/08/2019   T3FREE 2.0 04/15/2015   T3FREE 2.7 02/13/2015   Her Graves' antibodies are elevated: Lab Results  Component Value Date   TSI 161 (H) 09/14/2019   Thyroid ultrasound (08/18/2019) showed only one subcentimeter thyroid nodule, not worrisome. Parenchymal Echotexture: Moderately heterogenous Isthmus: 0.5 cm, previously 0.3 cm Right lobe: 4.8 x 1.5 x  1.5 cm, previously 5.0 x 1.9 x 1.6 cm Left lobe: 4.1 x 2.0 x 1.3 cm, previously 4.2 x 2.5 x 1.3 cm  Again noted are numerous cysts and cystic nodules throughout the thyroid.  Nodule # 2: Prior biopsy: No Location: Right; Inferior Maximum size: 0.9 cm; Other 2 dimensions: 0.5 x 0.6 cm, previously, 1.0 x 0.6 x 0.9 cm Composition: mixed cystic and solid (1) Echogenicity: isoechoic (1) Change in features: Yes; nodule now appears to be mixed cystic and solid composition rather than a solid  nodule. This nodule does NOT meet TI-RADS criteria for biopsy or dedicated follow-up. _____________________________________________________  IMPRESSION: Thyroid tissue is heterogeneous with several small thyroid nodules and cysts.  Dominant nodule is located in the inferior right thyroid lobe. This nodule has mixed cystic and solid composition and measures up to 0.9 cm. Nodule does not meet criteria for biopsy or dedicated follow-up.  Pt denies: - feeling nodules in neck - hoarseness - choking - SOB with lying down She has chronic dysphagia in the setting of strictures due to GERD - had Es dilations by Dr. Henrene Pastor in the past. She felt better in last 6 mo - on Ranitidine.  Unknown family history of thyroid disease as patient was adopted.  No h/o radiation tx to head or neck.  No herbal supplements. No Biotin use. No recent steroids use.   Pt. also has a history of GERD, sleep apnea, a hip replacement, had a nose reconstruction sx, cholecystectomy.  She was diagnosed with CREST sd. By rheumatology. She also has Raynaud's sd.   She is also giving MMI to her cat.  ROS: Constitutional: no weight gain/no weight loss, no fatigue, no subjective hyperthermia, + subjective hypothermia Eyes: no blurry vision, no xerophthalmia ENT: no sore throat, + see HPI, + hypoacusis -wears hearing aids Cardiovascular: no CP/+ SOB/+ palpitations/+ leg swelling Respiratory: no cough/+ SOB/no wheezing Gastrointestinal: no N/no V/no D/no C/+ acid reflux Musculoskeletal: no muscle aches/no joint aches Skin: no rashes, no hair loss Neurological: no tremors/no numbness/no tingling/no dizziness  I reviewed pt's medications, allergies, PMH, social hx, family hx, and changes were documented in the history of present illness. Otherwise, unchanged from my initial visit note.  Past Medical History:  Diagnosis Date  . Arthritis 04-20-12   Osteoarthritis-right hip  . Atrial fibrillation status post  cardioversion, 01/30/15 maintaining SR.  01/31/2015   a. 01/2015 s/p TEE/DCCV;  b. 02/06/2015 recurrent AF noted, amio added;  c. CHA2DS2VASc = 3-->eliquis.  Marland Kitchen BENIGN POSITIONAL VERTIGO   . Cataract   . Complication of anesthesia 04-20-12   some issues with prolonged sedation after anesthesia  . Diverticulosis   . DYSLIPIDEMIA   . Esophageal stricture   . GERD   . HYPERTENSION    a. severe, pt intol of med tx, Pt. has severe "whitecoat" syndrome and refused medical therapy.  . Hypothyroidism 09/29/2014   a. pt did not tolerate synthroid and this was subsequently discontinued.  . Mild LV dysfunction    a. 01/2015 Echo: EF 45-50%, mild MR, mildly dil LA, mod dil RV with mod to sev reduced fxn, mod-sev TR, PASP 43mmHg.  . Osteopenia   . Raynaud disease   . URINARY INCONTINENCE 04-20-12   occ. with nighttime sleep pattern   Past Surgical History:  Procedure Laterality Date  . ATRIAL FIBRILLATION ABLATION N/A 01/26/2017   Procedure: Atrial Fibrillation Ablation;  Surgeon: Thompson Grayer, MD;  Location: [place] [facility];  Service: Cardiovascular;  Laterality: N/A;  . breast  ultrasound Right 09/13/13   There is no sonographic evidence of malignancy. the 8.3MH complicated cyst in (R) breast is consistent with a benign finding. repeat in 1 year  . CARDIAC CATHETERIZATION N/A 08/29/2015   Procedure: Right Heart Cath;  Surgeon: Larey Dresser, MD;  Location: [place] [facility];  Service: Cardiovascular;  Laterality: N/A;  . CARDIOVERSION N/A 01/30/2015   Procedure: CARDIOVERSION;  Surgeon: Sanda Klein, MD;  Location: [facility] ENDOS[facility];  Service: Cardiovascular;  Laterality: N/A;  . CHOLECYSTECTOMY     '90-"sludge"  . NASAL FRACTURE SURGERY  2006  . RIGHT HEART CATH N/A 06/30/2017   Procedure: RIGHT HEART CATH;  Surgeon: Larey Dresser, MD;  Location: [place] [facility];  Service: Cardiovascular;  Laterality: N/A;  . TEE WITHOUT CARDIOVERSION N/A 01/30/2015   Procedure: TRANSESOPHAGEAL ECHOCARDIOGRAM  (TEE);  Surgeon: Sanda Klein, MD;  Location: [facility] ENDOSCOPY;  Service: Cardiovascular;  Laterality: N/A;  . TEE WITHOUT CARDIOVERSION N/A 01/25/2017   Procedure: TRANSESOPHAGEAL ECHOCARDIOGRAM (TEE);  Surgeon: Fay Records, MD;  Location: [facility] ENDOSCOPY;  Service: Cardiovascular;  Laterality: N/A;  . TOTAL HIP ARTHROPLASTY  04/26/2012   Procedure: TOTAL HIP ARTHROPLASTY ANTERIOR APPROACH;  Surgeon: Mauri Pole, MD;  Location: [facility];  Service: Orthopedics;  Laterality: Right;  . TUBAL LIGATION  1980   Social History   Socioeconomic History  . Marital status: Married    Spouse name: Not on file  . Number of children: 2  . Years of education: Not on file  . Highest education level: Not on file  Occupational History  . Not on file  Tobacco Use  . Smoking status: Never Smoker  . Smokeless tobacco: Never Used  Vaping Use  . Vaping Use: Never used  Substance and Sexual Activity  . Alcohol use: Yes    Alcohol/week: 2.0 standard drinks    Types: 2 Glasses of wine per week    Comment: several glasses wine weekly  . Drug use: No  . Sexual activity: Yes  Other Topics Concern  . Not on file  Social History Narrative   She enjoys birding.  Lives at home with husband.   Married.   retired Quarry manager, Tax inspector   Social Determinants of Radio broadcast assistant Strain:   . Difficulty of Paying Living Expenses: Not on file  Food Insecurity:   . Worried About Charity fundraiser in the Last Year: Not on file  . Ran Out of Food in the Last Year: Not on file  Transportation Needs:   . Lack of Transportation (Medical): Not on file  . Lack of Transportation (Non-Medical): Not on file  Physical Activity:   . Days of Exercise per Week: Not on file  . Minutes of Exercise per Session: Not on file  Stress:   . Feeling of Stress : Not on file  Social Connections:   . Frequency of Communication with Friends and Family: Not on file  . Frequency of Social Gatherings  with Friends and Family: Not on file  . Attends Religious Services: Not on file  . Active Member of Clubs or Organizations: Not on file  . Attends Archivist Meetings: Not on file  . Marital Status: Not on file  Intimate Partner Violence:   . Fear of Current or Ex-Partner: Not on file  . Emotionally Abused: Not on file  . Physically Abused: Not on file  . Sexually Abused: Not on file   Current Outpatient Medications  on File Prior to Visit  Medication Sig Dispense Refill  . acetaminophen (TYLENOL) 325 MG tablet Take 2 tablets (650 mg total) by mouth every 4 (four) hours as needed for headache or mild pain.    . Calcium Carb-Cholecalciferol (CALCIUM 600+D) 600-800 MG-UNIT TABS Take 1 tablet by mouth daily.    Marland Kitchen ELIQUIS 5 MG TABS tablet TAKE 1 TABLET BY MOUTH TWICE A DAY 180 tablet 1  . famotidine (PEPCID) 20 MG tablet TAKE 1 TABLET BY MOUTH TWICE A DAY 180 tablet 3  . flecainide (TAMBOCOR) 50 MG tablet Take 1 tablet (50 mg total) by mouth 2 (two) times daily. 180 tablet 2  . fluticasone (FLONASE) 50 MCG/ACT nasal spray Place 1 spray daily as needed into both nostrils for allergies or rhinitis.    . furosemide (LASIX) 40 MG tablet Take 1.5 tablets (60 mg total) by mouth daily.    . Melatonin 2.5 MG CAPS Take 2.5 mg by mouth as needed.    . methimazole (TAPAZOLE) 5 MG tablet Take 0.5 tablets (2.5 mg total) by mouth daily. 45 tablet 3  . metoprolol succinate (TOPROL-XL) 25 MG 24 hr tablet TAKE 1 TABLET BY MOUTH EVERY DAY 90 tablet 3  . OPSUMIT 10 MG tablet TAKE 1 TABLET BY MOUTH EVERY DAY 30 tablet 11  . potassium chloride (KLOR-CON) 10 MEQ tablet TAKE 4 TABLETS (40 MEQ TOTAL) BY MOUTH DAILY. 360 tablet 1  . Probiotic Product (PROBIOTIC-10) CAPS Take 1 capsule by mouth 3 (three) times a week.     . Riociguat (ADEMPAS) 2.5 MG TABS Take 2.5 mg by mouth 3 (three) times daily. 90 tablet 11  . Selexipag (UPTRAVI) 200 & 800 MCG TBPK Take 1400 mcg by mouth twice daily. Uptitrate by 200 mcg  BID as tolerated up to 2400 mcg BID. 300 each 11   No current facility-administered medications on file prior to visit.   Allergies  Allergen Reactions  . Levothyroxine Shortness Of Breath and Other (See Comments)    Exhaustion also - Per pt her this medication could have not been the reason for her symptoms  . Statins Other (See Comments)    "Pain, weakness and kidney problems"  . Prednisone Other (See Comments)    Severe insomnia  . Penicillins Other (See Comments)    dry mouth Has patient had a PCN reaction causing immediate rash, facial/tongue/throat swelling, SOB or lightheadedness with hypotension: No Has patient had a PCN reaction causing severe rash involving mucus membranes or skin necrosis: No Has patient had a PCN reaction that required hospitalization No Has patient had a PCN reaction occurring within the last 10 years: No If all of the above answers are "NO", then may proceed with Cephalosporin use.    Family History  Adopted: Yes  Problem Relation Age of Onset  . Other Other        patient was adopted    PE: BP 118/60   Pulse 63   Ht 5' (1.524 m)   Wt 140 lb 3.2 oz (63.6 kg)   SpO2 (!) 80%   BMI 27.38 kg/m  Wt Readings from Last 3 Encounters:  06/19/20 140 lb 3.2 oz (63.6 kg)  06/18/20 138 lb (62.6 kg)  05/28/20 141 lb (64 kg)   Constitutional: normal weight, in NAD Eyes: PERRLA, EOMI, no exophthalmos ENT: moist mucous membranes, + slight, symmetric, rubbery, thyromegaly, no cervical lymphadenopathy Cardiovascular: RRR, No MRG, + bilateral LE pitting edema -wears compression hoses Respiratory: CTA B Gastrointestinal: abdomen soft, NT,  ND, BS+ Musculoskeletal: no deformities, strength intact in all 4 Skin: moist, warm, no rashes Neurological: no tremor with outstretched hands, DTR normal in all 4  ASSESSMENT: 1.  Graves' disease  2. Thyroid nodule  PLAN:  1.  Graves' disease -Patient with a history of hypothyroidism, but with more recent  development of thyrotoxicosis.  Her TSI's were mildly positive, pointing towards a possible diagnosis of mild Graves' disease, but we did discuss that these antibodies may be slightly elevated in the setting of subacute thyroiditis.  In her case, based on her clinical course, I suspected mild Graves' disease.  We checked a thyroid uptake and scan and these were normal, consistent with mild Graves' disease and, less likely, resolved thyroiditis.  Of note, she did not have thyrotoxic symptoms: Weight loss, heat intolerance, hyper defecation, anxiety, but had palpitations, which were chronic for her, in the setting of atrial fibrillation.  This is controlled with metoprolol and flecainide per cardiology. No A fib episodes in >1 year.  -We started methimazole 5 mg daily in 09/2019.  She did not return in 1.5 months for repeat labs, we repeated these at last visit in a TSH was increased.  At that time, we stopped methimazole.  However, TSH was slightly low at 01/2020 and we restarted methimazole at the lower dose, of 2.5 mg daily.  On this dose, TFTs were normal in 02/2020. -At today's visit, she does not report thyrotoxic symptoms -We discussed about continuing methimazole for now since she responded so well to it, but other options for treatment are RAI treatment, or last resort, surgery. -She is on Toprol-XL and flecainide per cardiology.  -No signs of Graves' ophthalmopathy: No double vision, blurry vision, eye pain, chemosis - RTC in 6 months  2.  Thyroid nodule -This is a small, subcentimeter, hypoechoic, mixed (solid/cystic) nodule, which was seen on the thyroid ultrasound from 08/2019 -No neck compression symptoms -No intervention or follow-up is needed for this, unless she starts developing neck compression symptoms, in which case, we need to repeat a thyroid ultrasound.  Component     Latest Ref Rng & Units 06/19/2020  T4,Free(Direct)     0.60 - 1.60 ng/dL 1.05  TSH     0.35 - 4.50 uIU/mL 3.66   Triiodothyronine,Free,Serum     2.3 - 4.2 pg/mL 3.0  Normal TFTs. Will continue the same MMI dose for now but will repeat her tests in 3 months.  Philemon Kingdom, MD PhD [facility]

## 2020-06-19 NOTE — Patient Instructions (Addendum)
Please continue Methimazole 2.5 mg daily.  Please stop at the lab.  Please come back for a follow-up appointment in 6 months.

## 2020-06-21 NOTE — Assessment & Plan Note (Signed)
Flu shot up to date. Covid-19 up to date including booster. Pneumonia complete. Shingrix counseled. Tetanus due declines. Colonoscopy aged out. Mammogram aged out, pap smear aged out and dexa declines for now. Counseled about sun safety and mole surveillance. Counseled about the dangers of distracted driving. Given 10 year screening recommendations.

## 2020-06-21 NOTE — Assessment & Plan Note (Signed)
Monitoring renal function regularly and reviewed results today with her.

## 2020-06-21 NOTE — Assessment & Plan Note (Signed)
Stable without flare today.

## 2020-06-21 NOTE — Assessment & Plan Note (Signed)
Endocrinology is monitoring thyroid function.

## 2020-06-21 NOTE — Assessment & Plan Note (Signed)
Stable with treatment for symptoms.

## 2020-06-25 ENCOUNTER — Other Ambulatory Visit (HOSPITAL_COMMUNITY): Payer: Self-pay | Admitting: Physician Assistant

## 2020-06-25 ENCOUNTER — Other Ambulatory Visit (HOSPITAL_COMMUNITY): Payer: Self-pay | Admitting: Cardiology

## 2020-07-10 DIAGNOSIS — N184 Chronic kidney disease, stage 4 (severe): Secondary | ICD-10-CM | POA: Diagnosis not present

## 2020-07-16 DIAGNOSIS — I5030 Unspecified diastolic (congestive) heart failure: Secondary | ICD-10-CM | POA: Diagnosis not present

## 2020-07-16 DIAGNOSIS — I270 Primary pulmonary hypertension: Secondary | ICD-10-CM | POA: Diagnosis not present

## 2020-07-16 DIAGNOSIS — M341 CR(E)ST syndrome: Secondary | ICD-10-CM | POA: Diagnosis not present

## 2020-07-16 DIAGNOSIS — N184 Chronic kidney disease, stage 4 (severe): Secondary | ICD-10-CM | POA: Diagnosis not present

## 2020-07-19 ENCOUNTER — Ambulatory Visit (HOSPITAL_COMMUNITY)
Admission: RE | Admit: 2020-07-19 | Discharge: 2020-07-19 | Disposition: A | Discharge status: Home / Self Care | Payer: Medicare PPO | Source: Ambulatory Visit | Attending: Cardiology | Admitting: Cardiology

## 2020-07-19 ENCOUNTER — Encounter (HOSPITAL_COMMUNITY): Payer: Self-pay | Admitting: Cardiology

## 2020-07-19 ENCOUNTER — Other Ambulatory Visit: Payer: Self-pay

## 2020-07-19 VITALS — BP 120/60 | HR 73 | Wt 140.2 lb

## 2020-07-19 DIAGNOSIS — I480 Paroxysmal atrial fibrillation: Secondary | ICD-10-CM | POA: Diagnosis not present

## 2020-07-19 DIAGNOSIS — I5032 Chronic diastolic (congestive) heart failure: Secondary | ICD-10-CM | POA: Insufficient documentation

## 2020-07-19 DIAGNOSIS — N183 Chronic kidney disease, stage 3 unspecified: Secondary | ICD-10-CM | POA: Diagnosis not present

## 2020-07-19 DIAGNOSIS — I130 Hypertensive heart and chronic kidney disease with heart failure and stage 1 through stage 4 chronic kidney disease, or unspecified chronic kidney disease: Secondary | ICD-10-CM | POA: Insufficient documentation

## 2020-07-19 DIAGNOSIS — I2721 Secondary pulmonary arterial hypertension: Secondary | ICD-10-CM | POA: Diagnosis not present

## 2020-07-19 DIAGNOSIS — K222 Esophageal obstruction: Secondary | ICD-10-CM | POA: Diagnosis not present

## 2020-07-19 DIAGNOSIS — Z79899 Other long term (current) drug therapy: Secondary | ICD-10-CM | POA: Insufficient documentation

## 2020-07-19 DIAGNOSIS — Z7901 Long term (current) use of anticoagulants: Secondary | ICD-10-CM | POA: Insufficient documentation

## 2020-07-19 DIAGNOSIS — M341 CR(E)ST syndrome: Secondary | ICD-10-CM | POA: Diagnosis not present

## 2020-07-19 DIAGNOSIS — I272 Pulmonary hypertension, unspecified: Secondary | ICD-10-CM | POA: Diagnosis not present

## 2020-07-19 MED ORDER — TORSEMIDE 20 MG PO TABS: 40.0000 mg | ORAL_TABLET | Freq: Every day | ORAL | 3 refills | Status: DC

## 2020-07-19 NOTE — Patient Instructions (Signed)
Stop Furosemide  Start Torsemide 60 mg (3 tabs) Daily  FOR 3 DAYS ONLY, then decrease to 40 mg (2 tabs) Daily  Your physician recommends that you return for lab work in: 10-14 days  Your physician recommends that you schedule a follow-up appointment in: 1 month with echocardiogram  If you have any questions or concerns before your next appointment please send [place] a message through [place] or call our office at [phone].    TO LEAVE A MESSAGE FOR THE NURSE SELECT OPTION 2, PLEASE LEAVE A MESSAGE INCLUDING: . YOUR NAME . DATE OF BIRTH . CALL BACK NUMBER . REASON FOR CALL**this is important as we prioritize the call backs  [place] AS LONG AS YOU CALL BEFORE 4:00 PM  At the [facility], you and your health needs are our priority. As part of our continuing mission to provide you with exceptional heart care, we have created designated Provider Care Teams. These Care Teams include your primary Cardiologist (physician) and Advanced Practice Providers (APPs- Physician Assistants and Nurse Practitioners) who all work together to provide you with the care you need, when you need it.   You may see any of the following providers on your designated Care Team at your next follow up: Marland Kitchen Dr Glori Bickers . Dr Loralie Champagne . Darrick Grinder, NP . Lyda Jester, PA . Audry Riles, PharmD   Please be sure to bring in all your medications bottles to every appointment.

## 2020-07-21 NOTE — Progress Notes (Signed)
Patient ID: Tiffany [name], female   DOB: [birth year], 74 y.o.   MRN: [id number] PCP: Dr. Sharlet Salina HF Cardiology: Dr. [name]  74 y.o. with history of paroxysmal atrial fibrillation and diastolic CHF was noted to have significant pulmonary hypertension and exertional dyspnea.  She has history of paroxysmal atrial fibrillation and had an episode in 6/16 requiring cardioversion.  She was started on amiodarone but this was stopped with worsening breathing.  Cardiolite in 9/16 showed on ischemia or infarction.     I had her do a RHC in 1/17.  This showed elevated left and right heart filling pressures but also PAH. After cath, she increased her Lasix from 40 qod to 40 daily.  At a prior appointment, I increased Lasix to 40 mg bid and also started her on Opsumit.  At next appointment, I added Adcirca 20 and then titrated it up to 40 mg daily. She continue to be volume overloaded, so  I increased Lasix to 80 qam/40 qpm. She has seemed to do well on this dose.   Echo was done in 6/17, RV mildly dilated with moderately decreased systolic function and PASP 85 mmHg.    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   She had an atrial fibrillation ablation in 6/18.   RHC in 11/18 showed lower PA pressure than in the past with PA 54/16 and PVR 3.7 WU.  Echo showed preserved LV systolic function and normal RV size and function.  Holter showed PACs and PVCs, no atrial fibrillation.  I started her on Toprol XL.  CT chest did not show any significant lung findings. Echo in 12/19 showed EF 65-70%, moderate diastolic dysfunction, mild AS, mild MR, PASP 47 mmHg, normal RV.   She was started on flecainide for paroxysmal atrial fibrillation and has not had symptomatic atrial fibrillation for months now.   Echo in 1/21 showed EF 60-65%, moderately dilated RV with normal systolic function, mild-moderate TR with PASP 88 mmHg, IVC normal.   She returns today for followup of diastolic CHF and  pulmonary hypertension.  Weight is stable but she is more short of breath.  She is short of breath climbing stairs, walking long distances, or walking fast.  She has more coughing.  No chest pain.  No lightheadedness.      ECG (personally reviewed): NSR, right axis deviation, RVH.   6 minute walk (2/17): 141 m 6 minute walk (3/17): 182 m 6 minute walk (5/17): 229 m 6 minute walk (10/17): 305 m 6 minute walk (5/18): 256 m 6 minute walk (8/18): 427 m 6 minute walk (11/18): 61 m, oxygen saturation dropped as low as 70s.  6 minute walk (3/19): 260 m 6 minute walk (6/19): 372 m 6 minute walk (12/19): 317 m 6 minute walk (4/21): 305 m 6 minute walk (11/21): 243 m  Labs (12/16): BNP 1187, ANA 1:640, TSH elevated. Labs (2/17): K 5.4 => 3.9, creatinine 1.78 => 1.66, LDL 112, BNP 880 Labs (4/17): K 4.5, creatinine 1.76 Labs (5/17): K 4.1, creatinine 1.79 Labs (8/17): K 3.1, creatinine 1.7, BNP 404 Labs (9/17): K 3.9, creatinine 1.8, BNP 512 Labs (2/18): K 3.5, creatinine 1.71, BNP 157 Labs (5/18): LDL 120 Labs (6/18): K 3.6, creatinine 1.85 Labs (8/18): K 3.5, creatinine 1.78, BNP 88 Labs (11/18): K 3.6 => 3.2, creatinine 1.84 => 1.7, hgb 12.7 => 11.2. Labs (3/19): K 3.8, creatinine 1.74, LDL 123, HDL 56, TSH normal Labs (6/19): K 3.5, creatinine 1.91  Labs (9/19): K 3.5, creatinine 1.9 Labs (1/21): K 3.9, creatinine 2.44 Labs (4/21): K 3.8, creatinine 2.59, BNP 268 Labs (5/21): K 3.5, creatinine 2.9 Labs (9/21): K 4.4, creatinine 2.7, hgb 11 Labs (11/21): TSH normal  PMH:  1. CKD stage III 2. Bradycardia in setting of metoprolol + amiodarone use.  3. Raynauds phenomenon. 4. BPPV 5. OA right hip 6. Hyperthyroidism 7. Atrial fibrillation: Paroxysmal.  She was on amiodarone in the past but this was stopped when she became more short of breath.  TEE-guided DCCV in 6/16.  - Atrial fibrillation ablation 6/18.  - Holter (12/18) with PACs/PVCs, no atrial fibrillation.  8. HTN 9. Chronic  diastolic CHF with prominent RV failure: She has pulmonary arterial HTN that contributes to RV failure, but there is also a component of diastolic CHF with elevated PCWP on RHC. - TEE (6/16) with EF 45-50%, mildly dilated RV with mildly decreased systolic function, PASP 36 mmHg.  - RHC (1/17) with mean RA 9, PA 80/29 mean 52, mean PCWP 27, CI 1.97 Fick/1.8 thermo, PVR 7.6 Fick/8.4 thermo.  - Echo (6/17): EF 60-65%, mild MR, mild RV dilation with moderately decreased systolic function, moderate TR, PASP 85 mmHg.  - Echo (6/18): EF 43-32%, normal diastolic function, normal RV size and systolic function, PASP 36 mmHg. - Echo (12/18): EF 60-65%, grade II diastolic dysfunction, mild MR, normal RV size and systolic function, PASP 52 mmHg.  - RHC (11/18): mean RA 2, PA 54/16 mean 32, mean PCWP 9, CI 3.83, PVR 3.7 WU.  - Echo (12/19): EF 65-70%, moderate diastolic dysfunction, mild AS, mild MR, PASP 47 mmHg.  - Echo (1/21): EF 60-65%, moderately dilated RV with normal systolic function, mild-moderate TR with PASP 88 mmHg, IVC normal.  10. Pulmonary hypertension: See RHC above.  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  - TEE (6/16) with mildly dilated RV with mildly decreased systolic function. - ANA 9:518, anti-centromere antibody elevated, anti-SCL-70 antibody negative, h/o Raynauds - PFTs (7/16) with minimal obstructive defect but moderately decreased DLCO.  - V/Q scan (2/17) negative for acute or chronic PE.  11. Cardiolite (9/16) with no ischemia/infarction.  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  13. OSA: Severe on 9/17 sleep study. Now using CPAP.  14. GERD 15. Thyroid nodule: right nodule seen on 3/19 [place].   SH: Married, lives in [place], no smoking, no ETOH.   FH: Adopted.  Daughter has Raynaud's.  ROS: All systems reviewed and negative except as per HPI  Current Outpatient Medications  Medication Sig Dispense Refill  . acetaminophen (TYLENOL) 325 MG tablet Take 2 tablets (650 mg  total) by mouth every 4 (four) hours as needed for headache or mild pain.    . Calcium Carb-Cholecalciferol (CALCIUM 600+D) 600-800 MG-UNIT TABS Take 1 tablet by mouth daily.    Marland Kitchen ELIQUIS 5 MG TABS tablet TAKE 1 TABLET BY MOUTH TWICE A DAY 180 tablet 1  . famotidine (PEPCID) 20 MG tablet TAKE 1 TABLET BY MOUTH TWICE A DAY 180 tablet 3  . flecainide (TAMBOCOR) 50 MG tablet TAKE 1 TABLET BY MOUTH TWICE A DAY 180 tablet 2  . fluticasone (FLONASE) 50 MCG/ACT nasal spray Place 1 spray daily as needed into both nostrils for allergies or rhinitis.    . Melatonin 2.5 MG CAPS Take 2.5 mg by mouth as needed.    . methimazole (TAPAZOLE) 5 MG tablet Take 0.5 tablets (2.5 mg total) by mouth daily. 45 tablet 3  . metoprolol succinate (TOPROL-XL) 25  MG 24 hr tablet TAKE 1 TABLET BY MOUTH EVERY DAY 90 tablet 3  . OPSUMIT 10 MG tablet TAKE 1 TABLET BY MOUTH EVERY DAY 30 tablet 11  . potassium chloride (KLOR-CON) 10 MEQ tablet TAKE 4 TABLETS (40 MEQ TOTAL) BY MOUTH DAILY. 360 tablet 1  . Probiotic Product (PROBIOTIC-10) CAPS Take 1 capsule by mouth 3 (three) times a week.     . Riociguat (ADEMPAS) 2.5 MG TABS Take 2.5 mg by mouth 3 (three) times daily. 90 tablet 11  . Selexipag (UPTRAVI) 1600 MCG TABS Take 1,600 mcg by mouth in the morning and at bedtime.    . torsemide (DEMADEX) 20 MG tablet Take 2 tablets (40 mg total) by mouth daily. 64 tablet 3   No current facility-administered medications for this encounter.   BP 120/60   Pulse 73   Wt 63.6 kg (140 lb 3.2 oz)   SpO2 90%   BMI 27.38 kg/m   General: NAD Neck: JVP 10 cm with HJR, no thyromegaly or thyroid nodule.  Lungs: Slight crackles at bases CV: Nondisplaced PMI.  Heart regular S1/S2, no S3/S4, no murmur.  Trace ankle edema.  No carotid bruit.  Normal pedal pulses.  Abdomen: Soft, nontender, no hepatosplenomegaly, no distention.  Skin: Intact without lesions or rashes.  Neurologic: Alert and oriented x 3.  Psych: Normal affect. Extremities: No  clubbing or cyanosis.  HEENT: Normal.   Assessment/Plan: 1. Pulmonary hypertension: Patient has pulmonary arterial hypertension.  She appears to have co-existing diastolic LV dysfunction given elevated PCWP on prior RHC in 1/17.  PFTs did not show significant obstruction, they only showed moderately decreased DLCO consistent with pulmonary vascular disease. V/Q scan did not show evidence for chronic PE.  PVR 7.6 WU by Fick and 8.4 WU by thermodiluation on 1/17 RHC.  Cardiac index was low, 1.97 Fick/1.8 thermo. 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  Given worsening symptoms, RHC was repeated in 11/18.  This showed PVR 3.7 WU with mean PA pressure 32 and normal CI.  Most recent echo in 1/21 showed moderately enlarged RV with normal systolic function, PASP 88 mmHg.  NYHA class III symptoms, somewhat worse recently with volume overload.  6 minute walk in 11/21 was worse.  - Continue riociguat 2.5 mg tid.  - She has sleep apnea, now using CPAP.  - Continue Opsumit.  - She has tolerated increase in Uptravi to 1600 mcg bid. I will try to continue slowly increasing Uptravi up to as high as 2400 mcg bid. - She walked in the hall today (not formal 6 minute walk), oxygen saturation dropped as low as 88%.  Not ready for home oxygen yet.  - Repeat echo at followup in 1 month.  2. Chronic diastolic CHF/RV failure: Elevated PCWP on 1/17 RHC.  Last echo in 1/21 with EF 60-65%. NYHA class III symptoms. Volume overloaded on exam, complicated by CKD stage 3 .  - Stop Lasix, start torsemide 60 mg daily x 2 days then 40 mg daily. BMET today and in 10 days.  3. Atrial fibrillation: Paroxysmal.  She is in NSR today.  She had atrial fibrillation ablation in 6/18. She is now on flecainide.  - Continue Toprol XL 25 mg daily.  - Continue apixaban, CBC today.   - Continue flecainide.  4. CKD: Stage 3b. ?If CREST syndrome plays a role.  She is now seeing nephrology.  - BMET today.  5. CREST syndrome: Seeing Dr. Amil Amen.   Followup in 1 month with echo.   Loralie Champagne 07/21/2020

## 2020-07-22 ENCOUNTER — Telehealth (HOSPITAL_COMMUNITY): Payer: Self-pay | Admitting: Pharmacist

## 2020-07-22 NOTE — Telephone Encounter (Signed)
Advanced Heart Failure Patient Advocate Encounter  Prior Authorizations for Opsumit and Adempas have been approved.    Effective dates: 08/03/20 through 08/02/21  Audry Riles, PharmD, BCPS, BCCP, CPP Heart Failure Clinic Pharmacist [phone]

## 2020-07-22 NOTE — Telephone Encounter (Signed)
Patient Advocate Encounter   Received notification from [facility] that prior authorization for Opsumit is required.   PA submitted on CoverMyMeds Key [id number] Status is pending   Will continue to follow.   Audry Riles, PharmD, BCPS, BCCP, CPP Heart Failure Clinic Pharmacist [phone]

## 2020-07-22 NOTE — Telephone Encounter (Signed)
Patient Advocate Encounter   Received notification from [facility] that prior authorization for Adempas is required.   PA submitted on CoverMyMeds Key [id number] Status is pending   Will continue to follow.   Audry Riles, PharmD, BCPS, BCCP, CPP Heart Failure Clinic Pharmacist [phone]

## 2020-07-23 ENCOUNTER — Telehealth (HOSPITAL_COMMUNITY): Payer: Self-pay | Admitting: Pharmacist

## 2020-07-23 NOTE — Telephone Encounter (Signed)
Patient Advocate Encounter   Received notification from [facility] that prior authorization for Uptravi 200mg /800mg  is required.   PA submitted on CoverMyMeds Key [id number] Status is pending   Will continue to follow.   Audry Riles, PharmD, BCPS, BCCP, CPP Heart Failure Clinic Pharmacist [phone]

## 2020-07-23 NOTE — Telephone Encounter (Signed)
Patient Advocate Encounter   Received notification from [facility] that prior authorization for Uptravi 1600 mcg is required.   PA submitted on CoverMyMeds Key [id number] Status is pending   Will continue to follow.   Audry Riles, PharmD, BCPS, BCCP, CPP Heart Failure Clinic Pharmacist [phone]

## 2020-07-24 NOTE — Telephone Encounter (Signed)
Advanced Heart Failure Patient Advocate Encounter  Prior Authorizations for Uptravi 1600 mcg and Uptravi 200mg /800mg  titration pack have been approved.    Effective dates: 08/03/20 through 08/02/21  Audry Riles, PharmD, BCPS, BCCP, CPP Heart Failure Clinic Pharmacist [phone]

## 2020-07-29 ENCOUNTER — Ambulatory Visit (HOSPITAL_COMMUNITY)
Admission: RE | Admit: 2020-07-29 | Discharge: 2020-07-29 | Disposition: A | Discharge status: Home / Self Care | Payer: Medicare PPO | Source: Ambulatory Visit | Attending: Cardiology | Admitting: Cardiology

## 2020-07-29 ENCOUNTER — Other Ambulatory Visit: Payer: Self-pay

## 2020-07-29 DIAGNOSIS — I272 Pulmonary hypertension, unspecified: Secondary | ICD-10-CM

## 2020-07-29 LAB — BASIC METABOLIC PANEL
Anion gap: 8 (ref 5–15)
BUN: 39 mg/dL — ABNORMAL HIGH (ref 8–23)
CO2: 25 mmol/L (ref 22–32)
Calcium: 8.9 mg/dL (ref 8.9–10.3)
Chloride: 108 mmol/L (ref 98–111)
Creatinine, Ser: 2.88 mg/dL — ABNORMAL HIGH (ref 0.44–1.00)
GFR, Estimated: 16 mL/min — ABNORMAL LOW (ref >60)
Glucose, Bld: 93 mg/dL (ref 70–99)
Potassium: 4.2 mmol/L (ref 3.5–5.1)
Sodium: 141 mmol/L (ref 135–145)

## 2020-07-29 LAB — CBC
HCT: 32.8 % — ABNORMAL LOW (ref 36.0–46.0)
Hemoglobin: 10.1 g/dL — ABNORMAL LOW (ref 12.0–15.0)
MCH: 25.7 pg — ABNORMAL LOW (ref 26.0–34.0)
MCHC: 30.8 g/dL (ref 30.0–36.0)
MCV: 83.5 fL (ref 80.0–100.0)
Platelets: 164 10*3/uL (ref 150–400)
RBC: 3.93 MIL/uL (ref 3.87–5.11)
RDW: 17.2 % — ABNORMAL HIGH (ref 11.5–15.5)
WBC: 5.2 10*3/uL (ref 4.0–10.5)
nRBC: 0 % (ref 0.0–0.2)

## 2020-08-01 ENCOUNTER — Other Ambulatory Visit (HOSPITAL_COMMUNITY): Payer: Self-pay

## 2020-08-01 MED ORDER — TORSEMIDE 20 MG PO TABS
80.0000 mg | ORAL_TABLET | Freq: Every day | ORAL | 3 refills | Status: DC
Start: 2020-08-01 — End: 2020-11-08

## 2020-08-06 ENCOUNTER — Other Ambulatory Visit (HOSPITAL_COMMUNITY): Payer: Self-pay

## 2020-08-06 ENCOUNTER — Ambulatory Visit (HOSPITAL_COMMUNITY)
Admission: RE | Admit: 2020-08-06 | Discharge: 2020-08-06 | Disposition: A | Discharge status: Home / Self Care | Payer: Medicare PPO | Source: Ambulatory Visit | Attending: Cardiology | Admitting: Cardiology

## 2020-08-06 ENCOUNTER — Other Ambulatory Visit: Payer: Self-pay

## 2020-08-06 ENCOUNTER — Encounter (HOSPITAL_COMMUNITY): Payer: Self-pay | Admitting: Cardiology

## 2020-08-06 VITALS — BP 120/70 | HR 68 | Ht 60.0 in | Wt 139.8 lb

## 2020-08-06 DIAGNOSIS — D631 Anemia in chronic kidney disease: Secondary | ICD-10-CM | POA: Diagnosis not present

## 2020-08-06 DIAGNOSIS — Z79899 Other long term (current) drug therapy: Secondary | ICD-10-CM | POA: Diagnosis not present

## 2020-08-06 DIAGNOSIS — N1832 Chronic kidney disease, stage 3b: Secondary | ICD-10-CM | POA: Insufficient documentation

## 2020-08-06 DIAGNOSIS — I272 Pulmonary hypertension, unspecified: Secondary | ICD-10-CM | POA: Diagnosis not present

## 2020-08-06 DIAGNOSIS — I480 Paroxysmal atrial fibrillation: Secondary | ICD-10-CM | POA: Diagnosis not present

## 2020-08-06 DIAGNOSIS — G4733 Obstructive sleep apnea (adult) (pediatric): Secondary | ICD-10-CM | POA: Insufficient documentation

## 2020-08-06 DIAGNOSIS — I130 Hypertensive heart and chronic kidney disease with heart failure and stage 1 through stage 4 chronic kidney disease, or unspecified chronic kidney disease: Secondary | ICD-10-CM | POA: Insufficient documentation

## 2020-08-06 DIAGNOSIS — Z7901 Long term (current) use of anticoagulants: Secondary | ICD-10-CM | POA: Diagnosis not present

## 2020-08-06 DIAGNOSIS — E0590 Thyrotoxicosis, unspecified without thyrotoxic crisis or storm: Secondary | ICD-10-CM

## 2020-08-06 DIAGNOSIS — M341 CR(E)ST syndrome: Secondary | ICD-10-CM | POA: Insufficient documentation

## 2020-08-06 DIAGNOSIS — I5032 Chronic diastolic (congestive) heart failure: Secondary | ICD-10-CM | POA: Diagnosis not present

## 2020-08-06 DIAGNOSIS — E041 Nontoxic single thyroid nodule: Secondary | ICD-10-CM | POA: Diagnosis not present

## 2020-08-06 HISTORY — DX: Heart failure, unspecified: I50.9

## 2020-08-06 LAB — BASIC METABOLIC PANEL
Anion gap: 11 (ref 5–15)
BUN: 37 mg/dL — ABNORMAL HIGH (ref 8–23)
CO2: 25 mmol/L (ref 22–32)
Calcium: 9.7 mg/dL (ref 8.9–10.3)
Chloride: 104 mmol/L (ref 98–111)
Creatinine, Ser: 3.22 mg/dL — ABNORMAL HIGH (ref 0.44–1.00)
GFR, Estimated: 14 mL/min — ABNORMAL LOW (ref >60)
Glucose, Bld: 96 mg/dL (ref 70–99)
Potassium: 4.2 mmol/L (ref 3.5–5.1)
Sodium: 140 mmol/L (ref 135–145)

## 2020-08-06 LAB — CBC
HCT: 33 % — ABNORMAL LOW (ref 36.0–46.0)
Hemoglobin: 10.2 g/dL — ABNORMAL LOW (ref 12.0–15.0)
MCH: 25.5 pg — ABNORMAL LOW (ref 26.0–34.0)
MCHC: 30.9 g/dL (ref 30.0–36.0)
MCV: 82.5 fL (ref 80.0–100.0)
Platelets: 184 10*3/uL (ref 150–400)
RBC: 4 MIL/uL (ref 3.87–5.11)
RDW: 17.5 % — ABNORMAL HIGH (ref 11.5–15.5)
WBC: 4.5 10*3/uL (ref 4.0–10.5)
nRBC: 0 % (ref 0.0–0.2)

## 2020-08-06 LAB — IRON AND TIBC
Iron: 23 ug/dL — ABNORMAL LOW (ref 28–170)
Saturation Ratios: 6 % — ABNORMAL LOW (ref 10.4–31.8)
TIBC: 407 ug/dL (ref 250–450)
UIBC: 384 ug/dL

## 2020-08-06 LAB — T4, FREE: Free T4: 1.34 ng/dL — ABNORMAL HIGH (ref 0.61–1.12)

## 2020-08-06 LAB — TSH: TSH: 1.82 u[IU]/mL (ref 0.350–4.500)

## 2020-08-06 LAB — FERRITIN: Ferritin: 8 ng/mL — ABNORMAL LOW (ref 11–307)

## 2020-08-06 MED ORDER — POTASSIUM CHLORIDE ER 10 MEQ PO TBCR: 40.0000 meq | EXTENDED_RELEASE_TABLET | Freq: Every day | ORAL | 1 refills | Status: DC

## 2020-08-06 MED ORDER — METOLAZONE 2.5 MG PO TABS: 2.5000 mg | ORAL_TABLET | ORAL | 3 refills | Status: DC

## 2020-08-06 NOTE — Patient Instructions (Addendum)
START Metolazone 2.5mg  (1 tablet) every Wednesday    TAKE extra Potassium 56meq (1 tablet) every Wednesday  Labs done today, your results will be available in MyChart, we will contact you for abnormal readings.  Your physician recommends that you schedule repeat labs in 2 weeks  If you have any questions or concerns before your next appointment please send [place] a message through [place] or call our office at [phone].    TO LEAVE A MESSAGE FOR THE NURSE SELECT OPTION 2, PLEASE LEAVE A MESSAGE INCLUDING: . YOUR NAME . DATE OF BIRTH . CALL BACK NUMBER . REASON FOR CALL**this is important as we prioritize the call backs  YOU WILL RECEIVE A CALL BACK THE SAME DAY AS LONG AS YOU CALL BEFORE 4:00 PM   Heart Catheterization Instructions  You are scheduled for a Cardiac Catheterization on Friday, January 7 with Dr. Loralie Champagne.  1. Please arrive at the [facility] (Main Entrance A) at [facility]: [address] [place], [place] [address] at 10:30 AM (This time is two hours before your procedure to ensure your preparation). Free valet parking service is available.   Special note: Every effort is made to have your procedure done on time. Please understand that emergencies sometimes delay scheduled procedures.  2. Diet: Do not eat solid foods after midnight.  The patient may have clear liquids until 5am upon the day of the procedure.  3. COVID TEST: Wednesday 1/5 1:45pm   [place], [place] [address]  4. Medication instructions in preparation for your procedure:  DO NOT TAKE ELIQUIS 1/6-1/7 (Thursday AND Friday)  DO NOT TAKE TORSEMIDE 1/7  On the morning of your procedure, take any morning medicines NOT listed above.  You may use sips of water.  5. Plan for one night stay--bring personal belongings. 6. Bring a current list of your medications and current insurance cards. 7. You MUST have a responsible person to drive you home. 8. Someone MUST be with you  the first 24 hours after you arrive home or your discharge will be delayed. 9. Please wear clothes that are easy to get on and off and wear slip-on shoes.  Thank you for allowing [place] to care for you!   -- [facility]

## 2020-08-06 NOTE — H&P (View-Only) (Signed)
Patient ID: Tiffany [name], female   DOB: 28-Mar-1946, 75 y.o.   MRN: [id number] PCP: Dr. Sharlet Salina HF Cardiology: Dr. [name]  75 y.o. with history of paroxysmal atrial fibrillation and diastolic CHF was noted to have significant pulmonary hypertension and exertional dyspnea.  She has history of paroxysmal atrial fibrillation and had an episode in 6/16 requiring cardioversion.  She was started on amiodarone but this was stopped with worsening breathing.  Cardiolite in 9/16 showed on ischemia or infarction.     I had her do a RHC in 1/17.  This showed elevated left and right heart filling pressures but also PAH. After cath, she increased her Lasix from 40 qod to 40 daily.  At a prior appointment, I increased Lasix to 40 mg bid and also started her on Opsumit.  At next appointment, I added Adcirca 20 and then titrated it up to 40 mg daily. She continue to be volume overloaded, so  I increased Lasix to 80 qam/40 qpm. She has seemed to do well on this dose.   Echo was done in 6/17, RV mildly dilated with moderately decreased systolic function and PASP 85 mmHg.    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   She had an atrial fibrillation ablation in 6/18.   RHC in 11/18 showed lower PA pressure than in the past with PA 54/16 and PVR 3.7 WU.  Echo showed preserved LV systolic function and normal RV size and function.  Holter showed PACs and PVCs, no atrial fibrillation.  I started her on Toprol XL.  CT chest did not show any significant lung findings. Echo in 12/19 showed EF 65-70%, moderate diastolic dysfunction, mild AS, mild MR, PASP 47 mmHg, normal RV.   She was started on flecainide for paroxysmal atrial fibrillation and has not had symptomatic atrial fibrillation for months now.   Echo in 1/21 showed EF 60-65%, moderately dilated RV with normal systolic function, mild-moderate TR with PASP 88 mmHg, IVC normal.   She returns today for followup of diastolic CHF and  pulmonary hypertension.  At last appointment, she was more short of breath and volume overloaded.  The shortness of breath has gradually worsened over 5 to 6 months but has been particularly bad over the last few weeks.  I switched her diuretic to torsemide without much effect. Today, weight is down 1 lb.  Breathing is still poor.  She is short of breath and a little lightheaded when she climbs stairs.  She is lightheaded if she bends over and stands up.  No orthopnea/PND.  No chest pain.  No syncope.  No BRBPR/melena.     ECG (personally reviewed): NSR, right axis deviation, inferolateral T wave inversions  REDS clip 41%  6 minute walk (2/17): 141 m 6 minute walk (3/17): 182 m 6 minute walk (5/17): 229 m 6 minute walk (10/17): 305 m 6 minute walk (5/18): 256 m 6 minute walk (8/18): 427 m 6 minute walk (11/18): 61 m, oxygen saturation dropped as low as 70s.  6 minute walk (3/19): 260 m 6 minute walk (6/19): 372 m 6 minute walk (12/19): 317 m 6 minute walk (4/21): 305 m 6 minute walk (11/21): 243 m  Labs (12/16): BNP 1187, ANA 1:640, TSH elevated. Labs (2/17): K 5.4 => 3.9, creatinine 1.78 => 1.66, LDL 112, BNP 880 Labs (4/17): K 4.5, creatinine 1.76 Labs (5/17): K 4.1, creatinine 1.79 Labs (8/17): K 3.1, creatinine 1.7, BNP 404 Labs (9/17): K  3.9, creatinine 1.8, BNP 512 Labs (2/18): K 3.5, creatinine 1.71, BNP 157 Labs (5/18): LDL 120 Labs (6/18): K 3.6, creatinine 1.85 Labs (8/18): K 3.5, creatinine 1.78, BNP 88 Labs (11/18): K 3.6 => 3.2, creatinine 1.84 => 1.7, hgb 12.7 => 11.2. Labs (3/19): K 3.8, creatinine 1.74, LDL 123, HDL 56, TSH normal Labs (6/19): K 3.5, creatinine 1.91 Labs (9/19): K 3.5, creatinine 1.9 Labs (1/21): K 3.9, creatinine 2.44 Labs (4/21): K 3.8, creatinine 2.59, BNP 268 Labs (5/21): K 3.5, creatinine 2.9 Labs (9/21): K 4.4, creatinine 2.7, hgb 11 Labs (11/21): TSH normal Labs (12/21): hgb 10.1, K 4.2, creatinine 2.88  PMH:  1. CKD stage III 2.  Bradycardia in setting of metoprolol + amiodarone use.  3. Raynauds phenomenon. 4. BPPV 5. OA right hip 6. Hyperthyroidism 7. Atrial fibrillation: Paroxysmal.  She was on amiodarone in the past but this was stopped when she became more short of breath.  TEE-guided DCCV in 6/16.  - Atrial fibrillation ablation 6/18.  - Holter (12/18) with PACs/PVCs, no atrial fibrillation.  8. HTN 9. 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 - TEE (6/16) with EF 45-50%, mildly dilated RV with mildly decreased systolic function, PASP 36 mmHg.  - RHC (1/17) with mean RA 9, PA 80/29 mean 52, mean PCWP 27, CI 1.97 Fick/1.8 thermo, PVR 7.6 Fick/8.4 thermo.  - Echo (6/17): EF 60-65%, mild MR, mild RV dilation with moderately decreased systolic function, moderate TR, PASP 85 mmHg.  - Echo (6/18): EF 85-02%, normal diastolic function, normal RV size and systolic function, PASP 36 mmHg. - Echo (12/18): EF 60-65%, grade II diastolic dysfunction, mild MR, normal RV size and systolic function, PASP 52 mmHg.  - RHC (11/18): mean RA 2, PA 54/16 mean 32, mean PCWP 9, CI 3.83, PVR 3.7 WU.  - Echo (12/19): EF 65-70%, moderate diastolic dysfunction, mild AS, mild MR, PASP 47 mmHg.  - Echo (1/21): EF 60-65%, moderately dilated RV with normal systolic function, mild-moderate TR with PASP 88 mmHg, IVC normal.  10. Pulmonary hypertension: See RHC above.  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  - TEE (6/16) with mildly dilated RV with mildly decreased systolic function. - ANA 7:741, anti-centromere antibody elevated, anti-SCL-70 antibody negative, h/o Raynauds - PFTs (7/16) with minimal obstructive defect but moderately decreased DLCO.  - V/Q scan (2/17) negative for acute or chronic PE.  11. Cardiolite (9/16) with no ischemia/infarction.  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  13. OSA:  Severe on 9/17 sleep study. Now using CPAP.  14. GERD 15. Thyroid nodule: right nodule seen on 3/19 [place].   SH: Married, lives in [place], no smoking, no ETOH.   FH: Adopted.  Daughter has Raynaud's.  ROS: All systems reviewed and negative except as per HPI  Current Outpatient Medications  Medication Sig Dispense Refill  . acetaminophen (TYLENOL) 325 MG tablet Take 2 tablets (650 mg total) by mouth every 4 (four) hours as needed for headache or mild pain.    . Calcium Carb-Cholecalciferol (CALCIUM 600+D) 600-800 MG-UNIT TABS Take 600 mg by mouth daily.    Marland Kitchen ELIQUIS 5 MG TABS tablet TAKE 1 TABLET BY MOUTH TWICE A DAY (Patient taking differently: Take 5 mg by mouth 2 (two) times daily.) 180 tablet 1  . famotidine (PEPCID) 20 MG tablet TAKE 1 TABLET BY MOUTH TWICE A DAY (Patient  taking differently: Take 20 mg by mouth 2 (two) times daily.) 180 tablet 3  . flecainide (TAMBOCOR) 50 MG tablet TAKE 1 TABLET BY MOUTH TWICE A DAY (Patient taking differently: Take 50 mg by mouth 2 (two) times daily.) 180 tablet 2  . fluticasone (FLONASE) 50 MCG/ACT nasal spray Place 1 spray daily as needed into both nostrils for allergies or rhinitis.    . Melatonin 2.5 MG CAPS Take 2.5 mg by mouth at bedtime.    . methimazole (TAPAZOLE) 5 MG tablet Take 0.5 tablets (2.5 mg total) by mouth daily. 45 tablet 3  . metolazone (ZAROXOLYN) 2.5 MG tablet Take 1 tablet (2.5 mg total) by mouth once a week. On wednesdays (Patient taking differently: Take 2.5 mg by mouth once a week.) 12 tablet 3  . metoprolol succinate (TOPROL-XL) 25 MG 24 hr tablet TAKE 1 TABLET BY MOUTH EVERY DAY (Patient taking differently: Take 25 mg by mouth daily with supper.) 90 tablet 3  . OPSUMIT 10 MG tablet TAKE 1 TABLET BY MOUTH EVERY DAY (Patient taking differently: Take 10 mg by mouth daily with lunch.) 30 tablet 11  . Probiotic Product (PROBIOTIC-10) CAPS Take 1 capsule by mouth 3 (three) times a week.     . Riociguat (ADEMPAS) 2.5 MG TABS Take  2.5 mg by mouth 3 (three) times daily. 90 tablet 11  . Selexipag 1600 MCG TABS Take 1,600 mcg by mouth in the morning and at bedtime. Take with 200 for a total of 1600 mcg Uptravi    . torsemide (DEMADEX) 20 MG tablet Take 4 tablets (80 mg total) by mouth daily. 360 tablet 3  . potassium chloride (KLOR-CON) 10 MEQ tablet Take 4 tablets (40 mEq total) by mouth daily. Take extra 105meq with metolazone on wednesdays 360 tablet 1  . UPTRAVI 200 & 800 MCG TBPK Take 200 mcg by mouth 2 (two) times daily. Take with 1600 mcg for a total of 1800 mcg     No current facility-administered medications for this encounter.   BP 120/70   Pulse 68   Ht 5' (1.524 m)   Wt 63.4 kg (139 lb 12.8 oz)   SpO2 (!) 87%   BMI 27.30 kg/m   General: NAD Neck: JVP 12 cm, no thyromegaly or thyroid nodule.  Lungs: Mild crackles at bases.  CV: Nondisplaced PMI.  Heart regular S1/S2, no S3/S4, no murmur.  1+ edema 1/2 to knees bilaterally.  No carotid bruit.  Normal pedal pulses.  Abdomen: Soft, nontender, no hepatosplenomegaly, no distention.  Skin: Intact without lesions or rashes.  Neurologic: Alert and oriented x 3.  Psych: Normal affect. Extremities: No clubbing or cyanosis.  HEENT: Normal.   Assessment/Plan: 1. Pulmonary hypertension: Patient has pulmonary arterial hypertension.  She appears to have co-existing diastolic LV dysfunction given elevated PCWP on prior RHC in 1/17.  PFTs did not show significant obstruction, they only showed moderately decreased DLCO consistent with pulmonary vascular disease. V/Q scan did not show evidence for chronic PE.  PVR 7.6 WU by Fick and 8.4 WU by thermodiluation on 1/17 RHC.  Cardiac index was low, 1.97 Fick/1.8 thermo. 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  Given worsening symptoms, RHC was repeated in 11/18.   This showed PVR 3.7 WU with mean PA pressure 32 and normal CI.  Most recent echo in 1/21 showed moderately enlarged RV with  normal systolic function, PASP 88 mmHg.  NYHA class III symptoms, somewhat worse recently with volume overload.  6 minute walk in 11/21 was worse.  - Continue riociguat 2.5 mg tid.  - She has sleep apnea, now using CPAP.  - Continue Opsumit.  - She has tolerated increase in Uptravi to 1800 mcg bid. I will try to continue slowly increasing Uptravi up to as high as 2400 mcg bid. - She needs repeat echo, I will arrange.  2. Chronic diastolic CHF/RV failure: Elevated PCWP on 1/17 RHC.  Last echo in 1/21 with EF 60-65%. NYHA class IIIb symptoms. Volume overloaded on exam and by REDs clip, complicated by CKD stage 3.  - She has not responded as well as I had hoped to torsemide.  I will have her continue torsemide 80 mg daily but will take metolazone 2.5 with torsemide once a week on Wednesdays. She will add an extra KCl 20 mEq on metolazone days.  - BMET today and in 2 wks.  - With refractory volume overload, I will arrange for RHC to assess filling pressures, PA pressure, and cardiac output.  She may need to be admitted afterwards if filling pressures are significantly elevated given difficult diuresing with CHF and CKD. She will hold Eliquis the day before and day of procedure.  3. Atrial fibrillation: Paroxysmal.  She is in NSR today.  She had atrial fibrillation ablation in 6/18. She is now on flecainide.  - Continue Toprol XL 25 mg daily.  - Continue apixaban - Continue flecainide.  4. CKD: Stage 3b. ?If CREST syndrome plays a role.  She is now seeing nephrology.  - BMET today.  5. CREST syndrome: Seeing Dr. Amil Amen.  6. Anemia: Check ferritin, Fe, TIBC.   Followup in 2 wks after cath.   Loralie Champagne 08/06/2020

## 2020-08-06 NOTE — Progress Notes (Signed)
ReDS Vest / Clip - 08/06/20 1200      ReDS Vest / Clip   Station Marker A    Ruler Value 26    ReDS Value Range High volume overload    ReDS Actual Value 41

## 2020-08-06 NOTE — Progress Notes (Signed)
Patient ID: Tiffany [name], female   DOB: [birth year], 75 y.o.   MRN: [id number] PCP: Dr. Sharlet Salina HF Cardiology: Dr. [name]  75 y.o. with history of paroxysmal atrial fibrillation and diastolic CHF was noted to have significant pulmonary hypertension and exertional dyspnea.  She has history of paroxysmal atrial fibrillation and had an episode in 6/16 requiring cardioversion.  She was started on amiodarone but this was stopped with worsening breathing.  Cardiolite in 9/16 showed on ischemia or infarction.     I had her do a RHC in 1/17.  This showed elevated left and right heart filling pressures but also PAH. After cath, she increased her Lasix from 40 qod to 40 daily.  At a prior appointment, I increased Lasix to 40 mg bid and also started her on Opsumit.  At next appointment, I added Adcirca 20 and then titrated it up to 40 mg daily. She continue to be volume overloaded, so  I increased Lasix to 80 qam/40 qpm. She has seemed to do well on this dose.   Echo was done in 6/17, RV mildly dilated with moderately decreased systolic function and PASP 85 mmHg.    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   She had an atrial fibrillation ablation in 6/18.   RHC in 11/18 showed lower PA pressure than in the past with PA 54/16 and PVR 3.7 WU.  Echo showed preserved LV systolic function and normal RV size and function.  Holter showed PACs and PVCs, no atrial fibrillation.  I started her on Toprol XL.  CT chest did not show any significant lung findings. Echo in 12/19 showed EF 65-70%, moderate diastolic dysfunction, mild AS, mild MR, PASP 47 mmHg, normal RV.   She was started on flecainide for paroxysmal atrial fibrillation and has not had symptomatic atrial fibrillation for months now.   Echo in 1/21 showed EF 60-65%, moderately dilated RV with normal systolic function, mild-moderate TR with PASP 88 mmHg, IVC normal.   She returns today for followup of diastolic CHF and  pulmonary hypertension.  At last appointment, she was more short of breath and volume overloaded.  The shortness of breath has gradually worsened over 5 to 6 months but has been particularly bad over the last few weeks.  I switched her diuretic to torsemide without much effect. Today, weight is down 1 lb.  Breathing is still poor.  She is short of breath and a little lightheaded when she climbs stairs.  She is lightheaded if she bends over and stands up.  No orthopnea/PND.  No chest pain.  No syncope.  No BRBPR/melena.     ECG (personally reviewed): NSR, right axis deviation, inferolateral T wave inversions  REDS clip 41%  6 minute walk (2/17): 141 m 6 minute walk (3/17): 182 m 6 minute walk (5/17): 229 m 6 minute walk (10/17): 305 m 6 minute walk (5/18): 256 m 6 minute walk (8/18): 427 m 6 minute walk (11/18): 61 m, oxygen saturation dropped as low as 70s.  6 minute walk (3/19): 260 m 6 minute walk (6/19): 372 m 6 minute walk (12/19): 317 m 6 minute walk (4/21): 305 m 6 minute walk (11/21): 243 m  Labs (12/16): BNP 1187, ANA 1:640, TSH elevated. Labs (2/17): K 5.4 => 3.9, creatinine 1.78 => 1.66, LDL 112, BNP 880 Labs (4/17): K 4.5, creatinine 1.76 Labs (5/17): K 4.1, creatinine 1.79 Labs (8/17): K 3.1, creatinine 1.7, BNP 404 Labs (9/17): K  3.9, creatinine 1.8, BNP 512 Labs (2/18): K 3.5, creatinine 1.71, BNP 157 Labs (5/18): LDL 120 Labs (6/18): K 3.6, creatinine 1.85 Labs (8/18): K 3.5, creatinine 1.78, BNP 88 Labs (11/18): K 3.6 => 3.2, creatinine 1.84 => 1.7, hgb 12.7 => 11.2. Labs (3/19): K 3.8, creatinine 1.74, LDL 123, HDL 56, TSH normal Labs (6/19): K 3.5, creatinine 1.91 Labs (9/19): K 3.5, creatinine 1.9 Labs (1/21): K 3.9, creatinine 2.44 Labs (4/21): K 3.8, creatinine 2.59, BNP 268 Labs (5/21): K 3.5, creatinine 2.9 Labs (9/21): K 4.4, creatinine 2.7, hgb 11 Labs (11/21): TSH normal Labs (12/21): hgb 10.1, K 4.2, creatinine 2.88  PMH:  1. CKD stage III 2.  Bradycardia in setting of metoprolol + amiodarone use.  3. Raynauds phenomenon. 4. BPPV 5. OA right hip 6. Hyperthyroidism 7. Atrial fibrillation: Paroxysmal.  She was on amiodarone in the past but this was stopped when she became more short of breath.  TEE-guided DCCV in 6/16.  - Atrial fibrillation ablation 6/18.  - Holter (12/18) with PACs/PVCs, no atrial fibrillation.  8. HTN 9. 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 - TEE (6/16) with EF 45-50%, mildly dilated RV with mildly decreased systolic function, PASP 36 mmHg.  - RHC (1/17) with mean RA 9, PA 80/29 mean 52, mean PCWP 27, CI 1.97 Fick/1.8 thermo, PVR 7.6 Fick/8.4 thermo.  - Echo (6/17): EF 60-65%, mild MR, mild RV dilation with moderately decreased systolic function, moderate TR, PASP 85 mmHg.  - Echo (6/18): EF 51-76%, normal diastolic function, normal RV size and systolic function, PASP 36 mmHg. - Echo (12/18): EF 60-65%, grade II diastolic dysfunction, mild MR, normal RV size and systolic function, PASP 52 mmHg.  - RHC (11/18): mean RA 2, PA 54/16 mean 32, mean PCWP 9, CI 3.83, PVR 3.7 WU.  - Echo (12/19): EF 65-70%, moderate diastolic dysfunction, mild AS, mild MR, PASP 47 mmHg.  - Echo (1/21): EF 60-65%, moderately dilated RV with normal systolic function, mild-moderate TR with PASP 88 mmHg, IVC normal.  10. Pulmonary hypertension: See RHC above.  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  - TEE (6/16) with mildly dilated RV with mildly decreased systolic function. - ANA 1:607, anti-centromere antibody elevated, anti-SCL-70 antibody negative, h/o Raynauds - PFTs (7/16) with minimal obstructive defect but moderately decreased DLCO.  - V/Q scan (2/17) negative for acute or chronic PE.  11. Cardiolite (9/16) with no ischemia/infarction.  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  13. OSA:  Severe on 9/17 sleep study. Now using CPAP.  14. GERD 15. Thyroid nodule: right nodule seen on 3/19 [place].   SH: Married, lives in [place], no smoking, no ETOH.   FH: Adopted.  Daughter has Raynaud's.  ROS: All systems reviewed and negative except as per HPI  Current Outpatient Medications  Medication Sig Dispense Refill  . acetaminophen (TYLENOL) 325 MG tablet Take 2 tablets (650 mg total) by mouth every 4 (four) hours as needed for headache or mild pain.    . Calcium Carb-Cholecalciferol (CALCIUM 600+D) 600-800 MG-UNIT TABS Take 600 mg by mouth daily.    Marland Kitchen ELIQUIS 5 MG TABS tablet TAKE 1 TABLET BY MOUTH TWICE A DAY (Patient taking differently: Take 5 mg by mouth 2 (two) times daily.) 180 tablet 1  . famotidine (PEPCID) 20 MG tablet TAKE 1 TABLET BY MOUTH TWICE A DAY (Patient  taking differently: Take 20 mg by mouth 2 (two) times daily.) 180 tablet 3  . flecainide (TAMBOCOR) 50 MG tablet TAKE 1 TABLET BY MOUTH TWICE A DAY (Patient taking differently: Take 50 mg by mouth 2 (two) times daily.) 180 tablet 2  . fluticasone (FLONASE) 50 MCG/ACT nasal spray Place 1 spray daily as needed into both nostrils for allergies or rhinitis.    . Melatonin 2.5 MG CAPS Take 2.5 mg by mouth at bedtime.    . methimazole (TAPAZOLE) 5 MG tablet Take 0.5 tablets (2.5 mg total) by mouth daily. 45 tablet 3  . metolazone (ZAROXOLYN) 2.5 MG tablet Take 1 tablet (2.5 mg total) by mouth once a week. On wednesdays (Patient taking differently: Take 2.5 mg by mouth once a week.) 12 tablet 3  . metoprolol succinate (TOPROL-XL) 25 MG 24 hr tablet TAKE 1 TABLET BY MOUTH EVERY DAY (Patient taking differently: Take 25 mg by mouth daily with supper.) 90 tablet 3  . OPSUMIT 10 MG tablet TAKE 1 TABLET BY MOUTH EVERY DAY (Patient taking differently: Take 10 mg by mouth daily with lunch.) 30 tablet 11  . Probiotic Product (PROBIOTIC-10) CAPS Take 1 capsule by mouth 3 (three) times a week.     . Riociguat (ADEMPAS) 2.5 MG TABS Take  2.5 mg by mouth 3 (three) times daily. 90 tablet 11  . Selexipag 1600 MCG TABS Take 1,600 mcg by mouth in the morning and at bedtime. Take with 200 for a total of 1600 mcg Uptravi    . torsemide (DEMADEX) 20 MG tablet Take 4 tablets (80 mg total) by mouth daily. 360 tablet 3  . potassium chloride (KLOR-CON) 10 MEQ tablet Take 4 tablets (40 mEq total) by mouth daily. Take extra 19meq with metolazone on wednesdays 360 tablet 1  . UPTRAVI 200 & 800 MCG TBPK Take 200 mcg by mouth 2 (two) times daily. Take with 1600 mcg for a total of 1800 mcg     No current facility-administered medications for this encounter.   BP 120/70   Pulse 68   Ht 5' (1.524 m)   Wt 63.4 kg (139 lb 12.8 oz)   SpO2 (!) 87%   BMI 27.30 kg/m   General: NAD Neck: JVP 12 cm, no thyromegaly or thyroid nodule.  Lungs: Mild crackles at bases.  CV: Nondisplaced PMI.  Heart regular S1/S2, no S3/S4, no murmur.  1+ edema 1/2 to knees bilaterally.  No carotid bruit.  Normal pedal pulses.  Abdomen: Soft, nontender, no hepatosplenomegaly, no distention.  Skin: Intact without lesions or rashes.  Neurologic: Alert and oriented x 3.  Psych: Normal affect. Extremities: No clubbing or cyanosis.  HEENT: Normal.   Assessment/Plan: 1. Pulmonary hypertension: Patient has pulmonary arterial hypertension.  She appears to have co-existing diastolic LV dysfunction given elevated PCWP on prior RHC in 1/17.  PFTs did not show significant obstruction, they only showed moderately decreased DLCO consistent with pulmonary vascular disease. V/Q scan did not show evidence for chronic PE.  PVR 7.6 WU by Fick and 8.4 WU by thermodiluation on 1/17 RHC.  Cardiac index was low, 1.97 Fick/1.8 thermo. 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  Given worsening symptoms, RHC was repeated in 11/18.   This showed PVR 3.7 WU with mean PA pressure 32 and normal CI.  Most recent echo in 1/21 showed moderately enlarged RV with  normal systolic function, PASP 88 mmHg.  NYHA class III symptoms, somewhat worse recently with volume overload.  6 minute walk in 11/21 was worse.  - Continue riociguat 2.5 mg tid.  - She has sleep apnea, now using CPAP.  - Continue Opsumit.  - She has tolerated increase in Uptravi to 1800 mcg bid. I will try to continue slowly increasing Uptravi up to as high as 2400 mcg bid. - She needs repeat echo, I will arrange.  2. Chronic diastolic CHF/RV failure: Elevated PCWP on 1/17 RHC.  Last echo in 1/21 with EF 60-65%. NYHA class IIIb symptoms. Volume overloaded on exam and by REDs clip, complicated by CKD stage 3.  - She has not responded as well as I had hoped to torsemide.  I will have her continue torsemide 80 mg daily but will take metolazone 2.5 with torsemide once a week on Wednesdays. She will add an extra KCl 20 mEq on metolazone days.  - BMET today and in 2 wks.  - With refractory volume overload, I will arrange for RHC to assess filling pressures, PA pressure, and cardiac output.  She may need to be admitted afterwards if filling pressures are significantly elevated given difficult diuresing with CHF and CKD. She will hold Eliquis the day before and day of procedure.  3. Atrial fibrillation: Paroxysmal.  She is in NSR today.  She had atrial fibrillation ablation in 6/18. She is now on flecainide.  - Continue Toprol XL 25 mg daily.  - Continue apixaban - Continue flecainide.  4. CKD: Stage 3b. ?If CREST syndrome plays a role.  She is now seeing nephrology.  - BMET today.  5. CREST syndrome: Seeing Dr. Amil Amen.  6. Anemia: Check ferritin, Fe, TIBC.   Followup in 2 wks after cath.   Loralie Champagne 08/06/2020

## 2020-08-07 ENCOUNTER — Other Ambulatory Visit (HOSPITAL_COMMUNITY)
Admission: RE | Admit: 2020-08-07 | Discharge: 2020-08-07 | Disposition: A | Discharge status: Home / Self Care | Payer: Medicare PPO | Source: Ambulatory Visit | Attending: Cardiology | Admitting: Cardiology

## 2020-08-07 DIAGNOSIS — Z01812 Encounter for preprocedural laboratory examination: Secondary | ICD-10-CM | POA: Insufficient documentation

## 2020-08-07 DIAGNOSIS — Z20822 Contact with and (suspected) exposure to covid-19: Secondary | ICD-10-CM | POA: Insufficient documentation

## 2020-08-07 LAB — T3, FREE: T3, Free: 3.1 pg/mL (ref 2.0–4.4)

## 2020-08-08 ENCOUNTER — Telehealth (HOSPITAL_COMMUNITY): Payer: Self-pay

## 2020-08-08 LAB — SARS CORONAVIRUS 2 (TAT 6-24 HRS): SARS Coronavirus 2: NEGATIVE

## 2020-08-08 NOTE — Telephone Encounter (Signed)
Procedure: Heart Catheterization- Right side Date of service: 08/09/2020, notified to arrive at 73:[id number]. No precert required.

## 2020-08-09 ENCOUNTER — Ambulatory Visit (HOSPITAL_COMMUNITY)
Admission: RE | Admit: 2020-08-09 | Discharge: 2020-08-09 | Disposition: A | Discharge status: Home / Self Care | Payer: Medicare PPO | Attending: Cardiology | Admitting: Cardiology

## 2020-08-09 ENCOUNTER — Encounter (HOSPITAL_COMMUNITY)
Admission: RE | Disposition: A | Discharge status: Home / Self Care | Payer: Self-pay | Source: Home / Self Care | Attending: Cardiology

## 2020-08-09 DIAGNOSIS — D649 Anemia, unspecified: Secondary | ICD-10-CM | POA: Diagnosis not present

## 2020-08-09 DIAGNOSIS — Z79899 Other long term (current) drug therapy: Secondary | ICD-10-CM | POA: Insufficient documentation

## 2020-08-09 DIAGNOSIS — N1832 Chronic kidney disease, stage 3b: Secondary | ICD-10-CM | POA: Insufficient documentation

## 2020-08-09 DIAGNOSIS — M341 CR(E)ST syndrome: Secondary | ICD-10-CM | POA: Diagnosis not present

## 2020-08-09 DIAGNOSIS — I2721 Secondary pulmonary arterial hypertension: Secondary | ICD-10-CM | POA: Insufficient documentation

## 2020-08-09 DIAGNOSIS — Z7901 Long term (current) use of anticoagulants: Secondary | ICD-10-CM | POA: Insufficient documentation

## 2020-08-09 DIAGNOSIS — I480 Paroxysmal atrial fibrillation: Secondary | ICD-10-CM | POA: Diagnosis not present

## 2020-08-09 DIAGNOSIS — I5032 Chronic diastolic (congestive) heart failure: Secondary | ICD-10-CM | POA: Insufficient documentation

## 2020-08-09 DIAGNOSIS — I130 Hypertensive heart and chronic kidney disease with heart failure and stage 1 through stage 4 chronic kidney disease, or unspecified chronic kidney disease: Secondary | ICD-10-CM | POA: Diagnosis not present

## 2020-08-09 DIAGNOSIS — I272 Pulmonary hypertension, unspecified: Secondary | ICD-10-CM | POA: Diagnosis not present

## 2020-08-09 HISTORY — PX: RIGHT HEART CATH: CATH118263

## 2020-08-09 LAB — POCT I-STAT EG7
Acid-Base Excess: 2 mmol/L (ref 0.0–2.0)
Acid-Base Excess: 4 mmol/L — ABNORMAL HIGH (ref 0.0–2.0)
Bicarbonate: 25.2 mmol/L (ref 20.0–28.0)
Bicarbonate: 26.9 mmol/L (ref 20.0–28.0)
Calcium, Ion: 1.2 mmol/L (ref 1.15–1.40)
Calcium, Ion: 1.21 mmol/L (ref 1.15–1.40)
HCT: 30 % — ABNORMAL LOW (ref 36.0–46.0)
HCT: 30 % — ABNORMAL LOW (ref 36.0–46.0)
Hemoglobin: 10.2 g/dL — ABNORMAL LOW (ref 12.0–15.0)
Hemoglobin: 10.2 g/dL — ABNORMAL LOW (ref 12.0–15.0)
O2 Saturation: 74 %
O2 Saturation: 76 %
Potassium: 3.2 mmol/L — ABNORMAL LOW (ref 3.5–5.1)
Potassium: 3.2 mmol/L — ABNORMAL LOW (ref 3.5–5.1)
Sodium: 139 mmol/L (ref 135–145)
Sodium: 139 mmol/L (ref 135–145)
TCO2: 26 mmol/L (ref 22–32)
TCO2: 28 mmol/L (ref 22–32)
pCO2, Ven: 33.7 mmHg — ABNORMAL LOW (ref 44.0–60.0)
pCO2, Ven: 35.3 mmHg — ABNORMAL LOW (ref 44.0–60.0)
pH, Ven: 7.483 — ABNORMAL HIGH (ref 7.250–7.430)
pH, Ven: 7.49 — ABNORMAL HIGH (ref 7.250–7.430)
pO2, Ven: 36 mmHg (ref 32.0–45.0)
pO2, Ven: 37 mmHg (ref 32.0–45.0)

## 2020-08-09 SURGERY — RIGHT HEART CATH
Anesthesia: LOCAL

## 2020-08-09 MED ORDER — HYDRALAZINE HCL 20 MG/ML IJ SOLN
10.0000 mg | INTRAMUSCULAR | Status: DC | PRN
Start: 2020-08-09 — End: 2020-08-09

## 2020-08-09 MED ORDER — MIDAZOLAM HCL 2 MG/2ML IJ SOLN
INTRAMUSCULAR | Status: DC | PRN
  Administered 2020-08-09: 0.5 mg via INTRAVENOUS

## 2020-08-09 MED ORDER — SODIUM CHLORIDE 0.9 % IV SOLN: INTRAVENOUS | Status: DC

## 2020-08-09 MED ORDER — SODIUM CHLORIDE 0.9 % IV BOLUS
250.0000 mL | Freq: Once | INTRAVENOUS | Status: AC
  Administered 2020-08-09: 250 mL via INTRAVENOUS

## 2020-08-09 MED ORDER — FENTANYL CITRATE (PF) 100 MCG/2ML IJ SOLN
INTRAMUSCULAR | Status: AC
  Filled 2020-08-09: qty 2

## 2020-08-09 MED ORDER — SODIUM CHLORIDE 0.9 % IV SOLN: 250.0000 mL | INTRAVENOUS | Status: DC | PRN

## 2020-08-09 MED ORDER — LIDOCAINE HCL (PF) 1 % IJ SOLN
INTRAMUSCULAR | Status: AC
  Filled 2020-08-09: qty 30

## 2020-08-09 MED ORDER — FENTANYL CITRATE (PF) 100 MCG/2ML IJ SOLN
INTRAMUSCULAR | Status: DC | PRN
  Administered 2020-08-09: 25 ug via INTRAVENOUS

## 2020-08-09 MED ORDER — SODIUM CHLORIDE 0.9% FLUSH: 3.0000 mL | Freq: Two times a day (BID) | INTRAVENOUS | Status: DC

## 2020-08-09 MED ORDER — HEPARIN (PORCINE) IN NACL 1000-0.9 UT/500ML-% IV SOLN
INTRAVENOUS | Status: DC | PRN
  Administered 2020-08-09: 500 mL

## 2020-08-09 MED ORDER — HEPARIN (PORCINE) IN NACL 1000-0.9 UT/500ML-% IV SOLN
INTRAVENOUS | Status: AC
  Filled 2020-08-09: qty 1000

## 2020-08-09 MED ORDER — LIDOCAINE HCL (PF) 1 % IJ SOLN
INTRAMUSCULAR | Status: DC | PRN
  Administered 2020-08-09: 30 mL via INTRADERMAL

## 2020-08-09 MED ORDER — SODIUM CHLORIDE 0.9% FLUSH: 3.0000 mL | INTRAVENOUS | Status: DC | PRN

## 2020-08-09 MED ORDER — LABETALOL HCL 5 MG/ML IV SOLN: 10.0000 mg | INTRAVENOUS | Status: DC | PRN

## 2020-08-09 MED ORDER — ASPIRIN 81 MG PO CHEW
81.0000 mg | CHEWABLE_TABLET | ORAL | Status: AC
  Administered 2020-08-09: 81 mg via ORAL
  Filled 2020-08-09: qty 1

## 2020-08-09 MED ORDER — MIDAZOLAM HCL 2 MG/2ML IJ SOLN
INTRAMUSCULAR | Status: AC
  Filled 2020-08-09: qty 2

## 2020-08-09 MED ORDER — ACETAMINOPHEN 325 MG PO TABS: 650.0000 mg | ORAL_TABLET | ORAL | Status: DC | PRN

## 2020-08-09 MED ORDER — ONDANSETRON HCL 4 MG/2ML IJ SOLN: 4.0000 mg | Freq: Four times a day (QID) | INTRAMUSCULAR | Status: DC | PRN

## 2020-08-09 SURGICAL SUPPLY — 8 items
CATH BALLN WEDGE 5F 110CM (CATHETERS) ×1 IMPLANT
CATH SWAN GANZ 7F STRAIGHT (CATHETERS) ×1 IMPLANT
KIT HEART LEFT (KITS) ×2 IMPLANT
PACK CARDIAC CATHETERIZATION (CUSTOM PROCEDURE TRAY) ×2 IMPLANT
SHEATH GLIDE SLENDER 4/5FR (SHEATH) ×1 IMPLANT
SHEATH PINNACLE 7F 10CM (SHEATH) ×1 IMPLANT
TRANSDUCER W/STOPCOCK (MISCELLANEOUS) ×2 IMPLANT
WIRE EMERALD 3MM-J .025X260CM (WIRE) ×1 IMPLANT

## 2020-08-09 NOTE — Discharge Instructions (Signed)
1. Start Eliquis tomorrow morning.  2. Follow weight daily.  If weight goes up 3 lbs from today's weight by next Wednesday, take a dose of metolazone next Wednesday.  If weight does not go up 3 lbs, do not take metolazone and wait another week to reassess weight gain.     Femoral Site Care This sheet gives you information about how to care for yourself after your procedure. Your health care provider may also give you more specific instructions. If you have problems or questions, contact your health care provider. What can I expect after the procedure? After the procedure, it is common to have:  Bruising that usually fades within 1-2 weeks.  Tenderness at the site. Follow these instructions at home: Wound care  Follow instructions from your health care provider about how to take care of your insertion site. Make sure you: ? Wash your hands with soap and water before you change your bandage (dressing). If soap and water are not available, use hand sanitizer. ? Change your dressing as told by your health care provider.  Do not take baths, swim, or use a hot tub until your health care provider approves.  You may shower 24-48 hours after the procedure or as told by your health care provider. ? Gently wash the site with plain soap and water. ? Pat the area dry with a clean towel. ? Do not rub the site. This may cause bleeding.  Do not apply powder or lotion to the site. Keep the site clean and dry.  Check your femoral site every day for signs of infection. Check for: ? Redness, swelling, or pain. ? Fluid or blood. ? Warmth. ? Pus or a bad smell. Activity  For the first 2-3 days after your procedure, or as long as directed: ? Avoid climbing stairs as much as possible. ? Do not squat.  Do not lift anything that is heavier than 10 lb (4.5 kg), or the limit that you are told, until your health care provider says that it is safe.  Rest as directed. ? Avoid sitting for a long time  without moving. Get up to take short walks every 1-2 hours.  Do not drive for 24 hours if you were given a medicine to help you relax (sedative). General instructions  Take over-the-counter and prescription medicines only as told by your health care provider.  Keep all follow-up visits as told by your health care provider. This is important. Contact a health care provider if you have:  A fever or chills.  You have redness, swelling, or pain around your insertion site. Get help right away if:  The catheter insertion area swells very fast.  You pass out.  You suddenly start to sweat or your skin gets clammy.  The catheter insertion area is bleeding, and the bleeding does not stop when you hold steady pressure on the area.  The area near or just beyond the catheter insertion site becomes pale, cool, tingly, or numb. These symptoms may represent a serious problem that is an emergency. Do not wait to see if the symptoms will go away. Get medical help right away. Call your local emergency services (911 in the [place] Do not drive yourself to the hospital. Summary  After the procedure, it is common to have bruising that usually fades within 1-2 weeks.  Check your femoral site every day for signs of infection.  Do not lift anything that is heavier than 10 lb (4.5 kg), or the limit that  you are told, until your health care provider says that it is safe. This information is not intended to replace advice given to you by your health care provider. Make sure you discuss any questions you have with your health care provider. Document Revised: 08/02/2017 Document Reviewed: 08/02/2017 Elsevier Patient Education  2020 [employer].

## 2020-08-09 NOTE — Interval H&P Note (Signed)
History and Physical Interval Note:  08/09/2020 12:42 PM  Tiffany [name]  has presented today for surgery, with the diagnosis of heart faliure.  The various methods of treatment have been discussed with the patient and family. After consideration of risks, benefits and other options for treatment, the patient has consented to  Procedure(s): RIGHT HEART CATH (N/A) as a surgical intervention.  The patient's history has been reviewed, patient examined, no change in status, stable for surgery.  I have reviewed the patient's chart and labs.  Questions were answered to the patient's satisfaction.     Tassie Pollett [employer]

## 2020-08-12 ENCOUNTER — Encounter (HOSPITAL_COMMUNITY): Payer: Self-pay | Admitting: Cardiology

## 2020-08-13 ENCOUNTER — Other Ambulatory Visit (HOSPITAL_COMMUNITY): Payer: Self-pay | Admitting: Cardiology

## 2020-08-15 ENCOUNTER — Other Ambulatory Visit (HOSPITAL_COMMUNITY): Payer: Self-pay

## 2020-08-15 DIAGNOSIS — E611 Iron deficiency: Secondary | ICD-10-CM

## 2020-08-19 ENCOUNTER — Telehealth (HOSPITAL_COMMUNITY): Payer: Self-pay | Admitting: *Deleted

## 2020-08-19 ENCOUNTER — Other Ambulatory Visit (HOSPITAL_COMMUNITY): Payer: Medicare PPO

## 2020-08-19 NOTE — Discharge Instructions (Signed)
Ferumoxytol injection What is this medicine? FERUMOXYTOL is an iron complex. Iron is used to make healthy red blood cells, which carry oxygen and nutrients throughout the body. This medicine is used to treat iron deficiency anemia. This medicine may be used for other purposes; ask your health care provider or pharmacist if you have questions. COMMON BRAND NAME(S): Feraheme What should I tell my health care provider before I take this medicine? They need to know if you have any of these conditions:  anemia not caused by low iron levels  high levels of iron in the blood  magnetic resonance imaging (MRI) test scheduled  an unusual or allergic reaction to iron, other medicines, foods, dyes, or preservatives  pregnant or trying to get pregnant  breast-feeding How should I use this medicine? This medicine is for injection into a vein. It is given by a health care professional in a hospital or clinic setting. Talk to your pediatrician regarding the use of this medicine in children. Special care may be needed. Overdosage: If you think you have taken too much of this medicine contact a poison control center or emergency room at once. NOTE: This medicine is only for you. Do not share this medicine with others. What if I miss a dose? It is important not to miss your dose. Call your doctor or health care professional if you are unable to keep an appointment. What may interact with this medicine? This medicine may interact with the following medications:  other iron products This list may not describe all possible interactions. Give your health care provider a list of all the medicines, herbs, non-prescription drugs, or dietary supplements you use. Also tell them if you smoke, drink alcohol, or use illegal drugs. Some items may interact with your medicine. What should I watch for while using this medicine? Visit your doctor or healthcare professional regularly. Tell your doctor or healthcare  professional if your symptoms do not start to get better or if they get worse. You may need blood work done while you are taking this medicine. You may need to follow a special diet. Talk to your doctor. Foods that contain iron include: whole grains/cereals, dried fruits, beans, or peas, leafy green vegetables, and organ meats (liver, kidney). What side effects may I notice from receiving this medicine? Side effects that you should report to your doctor or health care professional as soon as possible:  allergic reactions like skin rash, itching or hives, swelling of the face, lips, or tongue  breathing problems  changes in blood pressure  feeling faint or lightheaded, falls  fever or chills  flushing, sweating, or hot feelings  swelling of the ankles or feet Side effects that usually do not require medical attention (report to your doctor or health care professional if they continue or are bothersome):  diarrhea  headache  nausea, vomiting  stomach pain This list may not describe all possible side effects. Call your doctor for medical advice about side effects. You may report side effects to FDA at 1-800-FDA-1088. Where should I keep my medicine? This drug is given in a hospital or clinic and will not be stored at home. NOTE: This sheet is a summary. It may not cover all possible information. If you have questions about this medicine, talk to your doctor, pharmacist, or health care provider.  2021 Elsevier/Gold Standard (2016-09-07 20:21:10)  

## 2020-08-19 NOTE — Telephone Encounter (Signed)
Spoke w/pt regarding lab work and feraheme infusion sch for 1/18, she request to move appts later in the week due to weather, left mess at short stay center pt will not be there 1/18 and I will c/b to reschedule

## 2020-08-20 ENCOUNTER — Other Ambulatory Visit (HOSPITAL_COMMUNITY): Payer: Medicare PPO

## 2020-08-20 ENCOUNTER — Inpatient Hospital Stay (HOSPITAL_COMMUNITY)
Admission: RE | Admit: 2020-08-20 | Discharge: 2020-08-20 | Disposition: A | Discharge status: Home / Self Care | Payer: Medicare PPO | Source: Ambulatory Visit | Attending: Cardiology | Admitting: Cardiology

## 2020-08-20 ENCOUNTER — Encounter (HOSPITAL_COMMUNITY): Payer: Self-pay

## 2020-08-20 ENCOUNTER — Other Ambulatory Visit: Payer: Self-pay | Admitting: Cardiovascular Disease

## 2020-08-21 ENCOUNTER — Other Ambulatory Visit (HOSPITAL_COMMUNITY): Payer: Self-pay

## 2020-08-21 MED ORDER — ADEMPAS 2.5 MG PO TABS: 2.5000 mg | ORAL_TABLET | Freq: Three times a day (TID) | ORAL | 11 refills | Status: DC

## 2020-08-22 ENCOUNTER — Encounter (HOSPITAL_COMMUNITY): Payer: Medicare PPO | Admitting: Cardiology

## 2020-08-22 ENCOUNTER — Other Ambulatory Visit (HOSPITAL_COMMUNITY): Payer: Medicare PPO

## 2020-08-27 ENCOUNTER — Encounter (HOSPITAL_COMMUNITY): Payer: Medicare PPO

## 2020-08-27 ENCOUNTER — Other Ambulatory Visit: Payer: Self-pay

## 2020-08-27 ENCOUNTER — Ambulatory Visit (HOSPITAL_BASED_OUTPATIENT_CLINIC_OR_DEPARTMENT_OTHER)
Admission: RE | Admit: 2020-08-27 | Discharge: 2020-08-27 | Disposition: A | Discharge status: Home / Self Care | Payer: Medicare PPO | Source: Ambulatory Visit | Attending: Cardiology | Admitting: Cardiology

## 2020-08-27 ENCOUNTER — Ambulatory Visit (HOSPITAL_COMMUNITY)
Admission: RE | Admit: 2020-08-27 | Discharge: 2020-08-27 | Disposition: A | Discharge status: Home / Self Care | Payer: Medicare PPO | Source: Ambulatory Visit | Attending: Cardiology | Admitting: Cardiology

## 2020-08-27 ENCOUNTER — Encounter (HOSPITAL_COMMUNITY): Payer: Self-pay | Admitting: Cardiology

## 2020-08-27 ENCOUNTER — Encounter (HOSPITAL_COMMUNITY)
Admission: RE | Admit: 2020-08-27 | Discharge: 2020-08-27 | Disposition: A | Discharge status: Home / Self Care | Payer: Medicare PPO | Source: Ambulatory Visit | Attending: Cardiology | Admitting: Cardiology

## 2020-08-27 VITALS — BP 106/68 | HR 74 | Wt 134.8 lb

## 2020-08-27 DIAGNOSIS — G4730 Sleep apnea, unspecified: Secondary | ICD-10-CM | POA: Insufficient documentation

## 2020-08-27 DIAGNOSIS — E611 Iron deficiency: Secondary | ICD-10-CM | POA: Insufficient documentation

## 2020-08-27 DIAGNOSIS — I5032 Chronic diastolic (congestive) heart failure: Secondary | ICD-10-CM | POA: Diagnosis not present

## 2020-08-27 DIAGNOSIS — Z7901 Long term (current) use of anticoagulants: Secondary | ICD-10-CM | POA: Insufficient documentation

## 2020-08-27 DIAGNOSIS — Z79899 Other long term (current) drug therapy: Secondary | ICD-10-CM | POA: Insufficient documentation

## 2020-08-27 DIAGNOSIS — I272 Pulmonary hypertension, unspecified: Secondary | ICD-10-CM

## 2020-08-27 DIAGNOSIS — Z9989 Dependence on other enabling machines and devices: Secondary | ICD-10-CM | POA: Insufficient documentation

## 2020-08-27 DIAGNOSIS — N184 Chronic kidney disease, stage 4 (severe): Secondary | ICD-10-CM | POA: Insufficient documentation

## 2020-08-27 DIAGNOSIS — M341 CR(E)ST syndrome: Secondary | ICD-10-CM | POA: Insufficient documentation

## 2020-08-27 DIAGNOSIS — D509 Iron deficiency anemia, unspecified: Secondary | ICD-10-CM | POA: Diagnosis not present

## 2020-08-27 DIAGNOSIS — I130 Hypertensive heart and chronic kidney disease with heart failure and stage 1 through stage 4 chronic kidney disease, or unspecified chronic kidney disease: Secondary | ICD-10-CM | POA: Diagnosis not present

## 2020-08-27 DIAGNOSIS — E785 Hyperlipidemia, unspecified: Secondary | ICD-10-CM | POA: Insufficient documentation

## 2020-08-27 DIAGNOSIS — I2721 Secondary pulmonary arterial hypertension: Secondary | ICD-10-CM | POA: Diagnosis not present

## 2020-08-27 DIAGNOSIS — R0600 Dyspnea, unspecified: Secondary | ICD-10-CM | POA: Diagnosis not present

## 2020-08-27 DIAGNOSIS — I480 Paroxysmal atrial fibrillation: Secondary | ICD-10-CM | POA: Insufficient documentation

## 2020-08-27 DIAGNOSIS — I082 Rheumatic disorders of both aortic and tricuspid valves: Secondary | ICD-10-CM | POA: Diagnosis not present

## 2020-08-27 LAB — BASIC METABOLIC PANEL
Anion gap: 14 (ref 5–15)
BUN: 44 mg/dL — ABNORMAL HIGH (ref 8–23)
CO2: 23 mmol/L (ref 22–32)
Calcium: 11.1 mg/dL — ABNORMAL HIGH (ref 8.9–10.3)
Chloride: 102 mmol/L (ref 98–111)
Creatinine, Ser: 3.4 mg/dL — ABNORMAL HIGH (ref 0.44–1.00)
GFR, Estimated: 13 mL/min — ABNORMAL LOW (ref >60)
Glucose, Bld: 95 mg/dL (ref 70–99)
Potassium: 4.5 mmol/L (ref 3.5–5.1)
Sodium: 139 mmol/L (ref 135–145)

## 2020-08-27 LAB — ECHOCARDIOGRAM COMPLETE
Area-P 1/2: 2.99 cm2
S' Lateral: 2.4 cm

## 2020-08-27 MED ORDER — SODIUM CHLORIDE 0.9 % IV SOLN
510.0000 mg | INTRAVENOUS | Status: DC
  Administered 2020-08-27: 510 mg via INTRAVENOUS
  Filled 2020-08-27: qty 510

## 2020-08-27 NOTE — Patient Instructions (Signed)
Labs done today. We will contact you only if your labs are abnormal.  TAKE AN EXTRA DOSE OF METOLAZONE THIS Saturday WITH YOUR MORNING DOSE OF TORSEMIDE ONLY. TAKE AN EXTRA 20MEQ(1 TABLET) OF POTASSIUM IN ADDITION TO THE METOLAZONE ON Saturday.   No other medication changes were made. Please continue all current medications as prescribed.  Your physician recommends that you schedule a follow-up appointment in: 1 week with our [facility]  If you have any questions or concerns before your next appointment please send [place] a message through [place] or call our office at [phone].    TO LEAVE A MESSAGE FOR THE NURSE SELECT OPTION 2, PLEASE LEAVE A MESSAGE INCLUDING: . YOUR NAME . DATE OF BIRTH . CALL BACK NUMBER . REASON FOR CALL**this is important as we prioritize the call backs  YOU WILL RECEIVE A CALL BACK THE SAME DAY AS LONG AS YOU CALL BEFORE 4:00 PM   Do the following things EVERYDAY: 1) Weigh yourself in the morning before breakfast. Write it down and keep it in a log. 2) Take your medicines as prescribed 3) Eat low salt foods--Limit salt (sodium) to 2000 mg per day.  4) Stay as active as you can everyday 5) Limit all fluids for the day to less than 2 liters   At the [facility], you and your health needs are our priority. As part of our continuing mission to provide you with exceptional heart care, we have created designated Provider Care Teams. These Care Teams include your primary Cardiologist (physician) and Advanced Practice Providers (APPs- Physician Assistants and Nurse Practitioners) who all work together to provide you with the care you need, when you need it.   You may see any of the following providers on your designated Care Team at your next follow up: Marland Kitchen Dr Glori Bickers . Dr Loralie Champagne . Darrick Grinder, NP . Lyda Jester, PA . Audry Riles, PharmD   Please be sure to bring in all your medications bottles to every appointment.

## 2020-08-27 NOTE — Progress Notes (Signed)
Echocardiogram 2D Echocardiogram has been performed.  Oneal Deputy Natisha Trzcinski 08/27/2020, 2:20 PM

## 2020-08-28 NOTE — Progress Notes (Signed)
Patient ID: Tiffany [name], female   DOB: [birth year], 75 y.o.   MRN: [id number] PCP: Dr. Sharlet Salina HF Cardiology: Dr. [name]  74 y.o. with history of paroxysmal atrial fibrillation and diastolic CHF was noted to have significant pulmonary hypertension and exertional dyspnea.  She has history of paroxysmal atrial fibrillation and had an episode in 6/16 requiring cardioversion.  She was started on amiodarone but this was stopped with worsening breathing.  Cardiolite in 9/16 showed on ischemia or infarction.     I had her do a RHC in 1/17.  This showed elevated left and right heart filling pressures but also PAH. After cath, she increased her Lasix from 40 qod to 40 daily.  At a prior appointment, I increased Lasix to 40 mg bid and also started her on Opsumit.  At next appointment, I added Adcirca 20 and then titrated it up to 40 mg daily. She continue to be volume overloaded, so  I increased Lasix to 80 qam/40 qpm. She has seemed to do well on this dose.   Echo was done in 6/17, RV mildly dilated with moderately decreased systolic function and PASP 85 mmHg.    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   She had an atrial fibrillation ablation in 6/18.   RHC in 11/18 showed lower PA pressure than in the past with PA 54/16 and PVR 3.7 WU.  Echo showed preserved LV systolic function and normal RV size and function.  Holter showed PACs and PVCs, no atrial fibrillation.  I started her on Toprol XL.  CT chest did not show any significant lung findings. Echo in 12/19 showed EF 65-70%, moderate diastolic dysfunction, mild AS, mild MR, PASP 47 mmHg, normal RV.   She was started on flecainide for paroxysmal atrial fibrillation and has not had symptomatic atrial fibrillation for months now.   Echo in 1/21 showed EF 60-65%, moderately dilated RV with normal systolic function, mild-moderate TR with PASP 88 mmHg, IVC normal.   RHC in 1/22 showed normal filling pressures but still with  severe pulmonary hypertension.  Cardiac output was preserved.  Echo was done today and reviewed, EF 55-60%, moderate RV dilation with moderately decreased systolic function, moderate TR, PASP 117 mmHg with dilated IVC.    She returns today for followup of RV failure and pulmonary hypertension.  She has quickly built up fluid again after improving remarkably after a dose of metolazone about 3 wks ago.  She is short of breath walking 50 feet, very short of breath with stairs (sleeping downstairs to avoid walking up stairs).  She is exhausted, appetite is poor.  No chest pain, no syncope/presyncope.  Weight is actually down 5 lbs. She received feraheme this morning for Fe deficiency.   6 minute walk (2/17): 141 m 6 minute walk (3/17): 182 m 6 minute walk (5/17): 229 m 6 minute walk (10/17): 305 m 6 minute walk (5/18): 256 m 6 minute walk (8/18): 427 m 6 minute walk (11/18): 61 m, oxygen saturation dropped as low as 70s.  6 minute walk (3/19): 260 m 6 minute walk (6/19): 372 m 6 minute walk (12/19): 317 m 6 minute walk (4/21): 305 m 6 minute walk (11/21): 243 m  Labs (12/16): BNP 1187, ANA 1:640, TSH elevated. Labs (2/17): K 5.4 => 3.9, creatinine 1.78 => 1.66, LDL 112, BNP 880 Labs (4/17): K 4.5, creatinine 1.76 Labs (5/17): K 4.1, creatinine 1.79 Labs (8/17): K 3.1, creatinine 1.7, BNP 404  Labs (9/17): K 3.9, creatinine 1.8, BNP 512 Labs (2/18): K 3.5, creatinine 1.71, BNP 157 Labs (5/18): LDL 120 Labs (6/18): K 3.6, creatinine 1.85 Labs (8/18): K 3.5, creatinine 1.78, BNP 88 Labs (11/18): K 3.6 => 3.2, creatinine 1.84 => 1.7, hgb 12.7 => 11.2. Labs (3/19): K 3.8, creatinine 1.74, LDL 123, HDL 56, TSH normal Labs (6/19): K 3.5, creatinine 1.91 Labs (9/19): K 3.5, creatinine 1.9 Labs (1/21): K 3.9, creatinine 2.44 Labs (4/21): K 3.8, creatinine 2.59, BNP 268 Labs (5/21): K 3.5, creatinine 2.9 Labs (9/21): K 4.4, creatinine 2.7, hgb 11 Labs (11/21): TSH normal Labs (12/21): hgb 10.1,  K 4.2, creatinine 2.88 Labs (1/22): K 4.2, creatinine 3.22  PMH:  1. CKD stage III 2. Bradycardia in setting of metoprolol + amiodarone use.  3. Raynauds phenomenon. 4. BPPV 5. OA right hip 6. Hyperthyroidism 7. Atrial fibrillation: Paroxysmal.  She was on amiodarone in the past but this was stopped when she became more short of breath.  TEE-guided DCCV in 6/16.  - Atrial fibrillation ablation 6/18.  - Holter (12/18) with PACs/PVCs, no atrial fibrillation.  8. HTN 9. 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 - TEE (6/16) with EF 45-50%, mildly dilated RV with mildly decreased systolic function, PASP 36 mmHg.  - RHC (1/17) with mean RA 9, PA 80/29 mean 52, mean PCWP 27, CI 1.97 Fick/1.8 thermo, PVR 7.6 Fick/8.4 thermo.  - Echo (6/17): EF 60-65%, mild MR, mild RV dilation with moderately decreased systolic function, moderate TR, PASP 85 mmHg.  - Echo (6/18): EF [id number], normal diastolic function, normal RV size and systolic function, PASP 36 mmHg. - Echo (12/18): EF 60-65%, grade II diastolic dysfunction, mild MR, normal RV size and systolic function, PASP 52 mmHg.  - RHC (11/18): mean RA 2, PA 54/16 mean 32, mean PCWP 9, CI 3.83, PVR 3.7 WU.  - Echo (12/19): EF 65-70%, moderate diastolic dysfunction, mild AS, mild MR, PASP 47 mmHg.  - Echo (1/21): EF 60-65%, moderately dilated RV with normal systolic function, mild-moderate TR with PASP 88 mmHg, IVC normal.  - Echo (1/22): EF 55-60%, moderate RV dilation with moderately decreased systolic function, moderate TR, PASP 117 mmHg with dilated IVC.   - RHC (1/22): mean RA 4, PA 87/26 mean 47, mean PCWP 9, CI 4.36 Fick, CI 3.61 thermo, PVR 5.6.  10. Pulmonary hypertension: See RHC above.  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  - TEE (6/16) with mildly dilated RV with mildly decreased systolic function. - ANA 0000000,  anti-centromere antibody elevated, anti-SCL-70 antibody negative, h/o Raynauds - PFTs (7/16) with minimal obstructive defect but moderately decreased DLCO.  - V/Q scan (2/17) negative for acute or chronic PE.  11. Cardiolite (9/16) with no ischemia/infarction.  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  13. OSA: Severe on 9/17 sleep study. Now using CPAP.  14. GERD 15. Thyroid nodule: right nodule seen on 3/19 [place].  16. Fe deficiency anemia.   SH: Married, lives in [place], no smoking, no ETOH.   FH: Adopted.  Daughter has Raynaud's.  ROS: All systems reviewed and negative except as per HPI  Current Outpatient Medications  Medication Sig Dispense Refill  . acetaminophen (TYLENOL) 325 MG tablet Take 2 tablets (650 mg total) by mouth every 4 (four) hours as needed for headache or mild pain.    . Calcium Carb-Cholecalciferol (  CALCIUM 600+D) 600-800 MG-UNIT TABS Take 600 mg by mouth daily.    Marland Kitchen ELIQUIS 5 MG TABS tablet TAKE 1 TABLET BY MOUTH TWICE A DAY 180 tablet 1  . famotidine (PEPCID) 20 MG tablet TAKE 1 TABLET BY MOUTH TWICE A DAY (Patient taking differently: Take 20 mg by mouth 2 (two) times daily.) 180 tablet 3  . flecainide (TAMBOCOR) 50 MG tablet TAKE 1 TABLET BY MOUTH TWICE A DAY (Patient taking differently: Take 50 mg by mouth 2 (two) times daily.) 180 tablet 2  . fluticasone (FLONASE) 50 MCG/ACT nasal spray Place 1 spray daily as needed into both nostrils for allergies or rhinitis.    . methimazole (TAPAZOLE) 5 MG tablet Take 0.5 tablets (2.5 mg total) by mouth daily. 45 tablet 3  . metolazone (ZAROXOLYN) 2.5 MG tablet Take 1 tablet (2.5 mg total) by mouth once a week. On wednesdays (Patient taking differently: Take 2.5 mg by mouth once a week.) 12 tablet 3  . metoprolol succinate (TOPROL-XL) 25 MG 24 hr tablet TAKE 1 TABLET BY MOUTH EVERY DAY 90 tablet 3  . OPSUMIT 10 MG tablet TAKE 1 TABLET BY MOUTH EVERY DAY (Patient taking differently: Take 10 mg by mouth daily  with lunch.) 30 tablet 11  . potassium chloride (KLOR-CON) 10 MEQ tablet Take 4 tablets (40 mEq total) by mouth daily. Take extra 44mq with metolazone on wednesdays 360 tablet 1  . Probiotic Product (PROBIOTIC-10) CAPS Take 1 capsule by mouth 3 (three) times a week.     . Riociguat (ADEMPAS) 2.5 MG TABS Take 2.5 mg by mouth 3 (three) times daily. 90 tablet 11  . torsemide (DEMADEX) 20 MG tablet Take 4 tablets (80 mg total) by mouth daily. 360 tablet 3  . UPTRAVI 200 & 800 MCG TBPK Take 200 mcg by mouth 2 (two) times daily. Take with 1600 mcg for a total of 1800 mcg     No current facility-administered medications for this encounter.   BP 106/68 (BP Location: Left Wrist)   Pulse 74   Wt 61.1 kg (134 lb 12.8 oz)   BMI 26.33 kg/m   General: NAD Neck: JVP 11-12 with HJR, no thyromegaly or thyroid nodule.  Lungs: Clear to auscultation bilaterally with normal respiratory effort. CV: Nondisplaced PMI.  Heart regular S1/S2, no S3/S4, no murmur.  1+ ankle edema.  No carotid bruit.  Normal pedal pulses.  Abdomen: Soft, nontender, no hepatosplenomegaly, no distention.  Skin: Intact without lesions or rashes.  Neurologic: Alert and oriented x 3.  Psych: Normal affect. Extremities: No clubbing or cyanosis.  HEENT: Normal.   Assessment/Plan: 1. Pulmonary hypertension: Patient has pulmonary arterial hypertension.  She appears to have co-existing diastolic LV dysfunction given elevated PCWP on prior RHC in 1/17.  PFTs did not show significant obstruction, they only showed moderately decreased DLCO consistent with pulmonary vascular disease. V/Q scan did not show evidence for chronic PE.  PVR 7.6 WU by Fick and 8.4 WU by thermodiluation on 1/17 RHC.  Cardiac index was low, 1.97 Fick/1.8 thermo. 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  ANA 1:640 with elevated anti-centromere antibody and Raynaud's phenomenon, suspect PAH related to rheumatological disease, incomplete CREST syndrome per Dr TElmon Elselast note.   Given worsening symptoms, RHC was repeated in 11/18.  This showed PVR 3.7 WU with mean PA pressure 32 and normal CI.  Echo done in 1/22 showed normal filling pressures and cardiac output but still with severe pulmonary hypertension.   Echo was done today, showing  normal LV systolic function but moderately dilated and dysfunctional RV.   - Continue riociguat 2.5 mg tid.  - She has sleep apnea, now using CPAP.  - Continue Opsumit.  - She has tolerated increase in Uptravi to 1800 mcg bid.  She will increase to 2000 mcg bid next Monday and will continue to titrate up to 2400 mcg bid. 2. Chronic diastolic CHF/RV failure: Elevated PCWP on 1/17 RHC.  NYHA class IIIb symptoms, worse today.  She is more volume overloaded on exam. This is complicated by CKD stage IV.  - Continue torsemide 80 mg daily. - She will take metolazone 2.5 mg Wednesday and Saturday this week, then every Wednesday going forward. BMET today and again in 1 week.  3. Atrial fibrillation: Paroxysmal.  She is in NSR today by exam.  She had atrial fibrillation ablation in 6/18. She is now on flecainide.  - Continue Toprol XL 25 mg daily.  - Continue apixaban - Continue flecainide.  4. CKD: Stage IV. ?If scleroderma plays a role.  She is now seeing nephrology.  - BMET today.  - Needs appt soon with nephrology.  5. CREST syndrome: Seeing Dr. Amil Amen.  6. Anemia: Fe deficient, she got IV Fe today.   Followup in 1 week with NP/PA.   Loralie Champagne 08/28/2020

## 2020-08-29 ENCOUNTER — Other Ambulatory Visit (HOSPITAL_COMMUNITY): Payer: Self-pay

## 2020-08-29 DIAGNOSIS — I270 Primary pulmonary hypertension: Secondary | ICD-10-CM

## 2020-08-29 MED ORDER — OPSUMIT 10 MG PO TABS: 10.0000 mg | ORAL_TABLET | Freq: Every day | ORAL | 11 refills | Status: AC

## 2020-09-01 NOTE — Progress Notes (Addendum)
Patient ID: Tiffany [name], female   DOB: Dec 24, 1945, 75 y.o.   MRN: [id number] PCP: Dr. Sharlet Salina HF Cardiology: Dr. [name]  75 y.o. with history of paroxysmal atrial fibrillation and diastolic CHF was noted to have significant pulmonary hypertension and exertional dyspnea.  She has history of paroxysmal atrial fibrillation and had an episode in 6/16 requiring cardioversion.  She was started on amiodarone but this was stopped with worsening breathing.  Cardiolite in 9/16 showed on ischemia or infarction.     I had her do a RHC in 1/17.  This showed elevated left and right heart filling pressures but also PAH. After cath, she increased her Lasix from 40 qod to 40 daily.  At a prior appointment, I increased Lasix to 40 mg bid and also started her on Opsumit.  At next appointment, I added Adcirca 20 and then titrated it up to 40 mg daily. She continue to be volume overloaded, so  I increased Lasix to 80 qam/40 qpm. She has seemed to do well on this dose.   Echo was done in 6/17, RV mildly dilated with moderately decreased systolic function and PASP 85 mmHg.    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   She had an atrial fibrillation ablation in 6/18.   RHC in 11/18 showed lower PA pressure than in the past with PA 54/16 and PVR 3.7 WU.  Echo showed preserved LV systolic function and normal RV size and function.  Holter showed PACs and PVCs, no atrial fibrillation.  I started her on Toprol XL.  CT chest did not show any significant lung findings. Echo in 12/19 showed EF 65-70%, moderate diastolic dysfunction, mild AS, mild MR, PASP 47 mmHg, normal RV.   She was started on flecainide for paroxysmal atrial fibrillation and has not had symptomatic atrial fibrillation for months now.   Echo in 1/21 showed EF 60-65%, moderately dilated RV with normal systolic function, mild-moderate TR with PASP 88 mmHg, IVC normal.   She returns today for followup of diastolic CHF and  pulmonary hypertension.  At last appointment, she was more short of breath and volume overloaded.  The shortness of breath has gradually worsened over 5 to 6 months but has been particularly bad over the last few weeks.  I switched her diuretic to torsemide without much effect. Today, weight is down 1 lb.  Breathing is still poor.  She is short of breath and a little lightheaded when she climbs stairs.  She is lightheaded if she bends over and stands up.  No orthopnea/PND.  No chest pain.  No syncope.  No BRBPR/melena.   Volume elevated, REDS 41%. Metolazone added once a week (Wed) this visit & RHC scheduled.  Today she returns for HF follow up. After RHC, metolazone changed to every Wednesday. Overall feeling fine. Gets SOB with stairs, is now sleeping downstairs in her home. But does feel her breathing is better compared to last week. Is having bilateral calf pain in the AM. Has only been taking extra 1 KCl tablet on metolazone days, instead of 2. Denies increasing SOB, CP, dizziness, edema, or PND/Orthopnea. Appetite ok. No fever or chills. Weight at home ~129-124 pounds past week. Taking all medications.   ECG (personally reviewed): not ordered today.  REDS clip 26%.  6 minute walk (2/17): 141 m 6 minute walk (3/17): 182 m 6 minute walk (5/17): 229 m 6 minute walk (10/17): 305 m 6 minute walk (5/18): 256 m  6 minute walk (8/18): 427 m 6 minute walk (11/18): 61 m, oxygen saturation dropped as low as 70s.  6 minute walk (3/19): 260 m 6 minute walk (6/19): 372 m 6 minute walk (12/19): 317 m 6 minute walk (4/21): 305 m 6 minute walk (11/21): 243 m  Labs (12/16): BNP 1187, ANA 1:640, TSH elevated. Labs (2/17): K 5.4 => 3.9, creatinine 1.78 => 1.66, LDL 112, BNP 880 Labs (4/17): K 4.5, creatinine 1.76 Labs (5/17): K 4.1, creatinine 1.79 Labs (8/17): K 3.1, creatinine 1.7, BNP 404 Labs (9/17): K 3.9, creatinine 1.8, BNP 512 Labs (2/18): K 3.5, creatinine 1.71, BNP 157 Labs (5/18): LDL  120 Labs (6/18): K 3.6, creatinine 1.85 Labs (8/18): K 3.5, creatinine 1.78, BNP 88 Labs (11/18): K 3.6 => 3.2, creatinine 1.84 => 1.7, hgb 12.7 => 11.2. Labs (3/19): K 3.8, creatinine 1.74, LDL 123, HDL 56, TSH normal Labs (6/19): K 3.5, creatinine 1.91 Labs (9/19): K 3.5, creatinine 1.9 Labs (1/21): K 3.9, creatinine 2.44 Labs (4/21): K 3.8, creatinine 2.59, BNP 268 Labs (5/21): K 3.5, creatinine 2.9 Labs (9/21): K 4.4, creatinine 2.7, hgb 11 Labs (11/21): TSH normal Labs (12/21): hgb 10.1, K 4.2, creatinine 2.88 Labs (1/22): K 4.5, creatinine 3.40  PMH:  1. CKD stage III 2. Bradycardia in setting of metoprolol + amiodarone use.  3. Raynauds phenomenon. 4. BPPV 5. OA right hip 6. Hyperthyroidism 7. Atrial fibrillation: Paroxysmal.  She was on amiodarone in the past but this was stopped when she became more short of breath.  TEE-guided DCCV in 6/16.  - Atrial fibrillation ablation 6/18.  - Holter (12/18) with PACs/PVCs, no atrial fibrillation.  8. HTN 9. 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 - TEE (6/16) with EF 45-50%, mildly dilated RV with mildly decreased systolic function, PASP 36 mmHg.  - RHC (1/17) with mean RA 9, PA 80/29 mean 52, mean PCWP 27, CI 1.97 Fick/1.8 thermo, PVR 7.6 Fick/8.4 thermo.  - Echo (6/17): EF 60-65%, mild MR, mild RV dilation with moderately decreased systolic function, moderate TR, PASP 85 mmHg.  - Echo (6/18): EF 123456, normal diastolic function, normal RV size and systolic function, PASP 36 mmHg. - Echo (12/18): EF 60-65%, grade II diastolic dysfunction, mild MR, normal RV size and systolic function, PASP 52 mmHg.  - RHC (11/18): mean RA 2, PA 54/16 mean 32, mean PCWP 9, CI 3.83, PVR 3.7 WU.  - Echo (12/19): EF 65-70%, moderate diastolic dysfunction, mild AS, mild MR, PASP 47 mmHg.  - Echo (1/21): EF  60-65%, moderately dilated RV with normal systolic function, mild-moderate TR with PASP 88 mmHg, IVC normal.  - Echo (1/22): EF 55-60%, Grade 2 DD, RV moderately down, PASP 116.6 mmHg, moderate TR, mild AS - RHC (1/22): Optimized filling pressures. Severe pulmonary arterial hypertension. RA mean 4, RV 86/9, PA 87/26, mean 47, PCWP mean 9, CO (Fick) 6.78, CI (Fick) 4.36, PVR 5.6 WU 10. Pulmonary hypertension: See RHC above.  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  - TEE (6/16) with mildly dilated RV with mildly decreased systolic function. - ANA 0000000, anti-centromere antibody elevated, anti-SCL-70 antibody negative, h/o Raynauds - PFTs (7/16) with minimal obstructive defect but moderately decreased DLCO.  - V/Q scan (2/17) negative for acute or chronic PE.  11. Cardiolite (9/16) with no ischemia/infarction.  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  13.  OSA: Severe on 9/17 sleep study. Now using CPAP.  14. GERD 15. Thyroid nodule: right nodule seen on 3/19 [place].   SH: Married, lives in [place], no smoking, no ETOH.   FH: Adopted.  Daughter has Raynaud's.  ROS: All systems reviewed and negative except as per HPI  Current Outpatient Medications  Medication Sig Dispense Refill   acetaminophen (TYLENOL) 325 MG tablet Take 2 tablets (650 mg total) by mouth every 4 (four) hours as needed for headache or mild pain.     Calcium Carb-Cholecalciferol (CALCIUM 600+D) 600-800 MG-UNIT TABS Take 600 mg by mouth daily.     ELIQUIS 5 MG TABS tablet TAKE 1 TABLET BY MOUTH TWICE A DAY 180 tablet 1   famotidine (PEPCID) 20 MG tablet TAKE 1 TABLET BY MOUTH TWICE A DAY 180 tablet 3   flecainide (TAMBOCOR) 50 MG tablet TAKE 1 TABLET BY MOUTH TWICE A DAY 180 tablet 2   fluticasone (FLONASE) 50 MCG/ACT nasal spray Place 1 spray daily as needed into both nostrils for allergies or rhinitis.     macitentan (OPSUMIT) 10 MG tablet Take 1 tablet (10 mg total) by mouth daily. 30 tablet 11   Melatonin 2.5 MG  CAPS Take by mouth at bedtime.     methimazole (TAPAZOLE) 5 MG tablet Take 0.5 tablets (2.5 mg total) by mouth daily. 45 tablet 3   metolazone (ZAROXOLYN) 2.5 MG tablet Take 1 tablet (2.5 mg total) by mouth once a week. On wednesdays 12 tablet 3   metoprolol succinate (TOPROL-XL) 25 MG 24 hr tablet TAKE 1 TABLET BY MOUTH EVERY DAY 90 tablet 3   potassium chloride (KLOR-CON) 10 MEQ tablet Take 4 tablets (40 mEq total) by mouth daily. Take extra 51mq with metolazone on wednesdays 360 tablet 1   Probiotic Product (PROBIOTIC-10) CAPS Take 1 capsule by mouth 3 (three) times a week.      Riociguat (ADEMPAS) 2.5 MG TABS Take 2.5 mg by mouth 3 (three) times daily. 90 tablet 11   Selexipag (UPTRAVI) 1000 MCG TABS Take 2,000 mcg by mouth 2 (two) times daily.     torsemide (DEMADEX) 20 MG tablet Take 4 tablets (80 mg total) by mouth daily. 360 tablet 3   No current facility-administered medications for this encounter.   Facility-Administered Medications Ordered in Other Encounters  Medication Dose Route Frequency Provider Last Rate Last Admin   ferumoxytol (FERAHEME) 510 mg in sodium chloride 0.9 % 100 mL IVPB  510 mg Intravenous Weekly MLarey Dresser MD   Stopped at 09/03/20 1233   Wt Readings from Last 3 Encounters:  09/03/20 59 kg (130 lb)  08/27/20 61.1 kg (134 lb 12.8 oz)  08/09/20 59.2 kg (130 lb 8 oz)    BP 100/62    Pulse 65    Wt 59 kg (130 lb)    BMI 25.39 kg/m   Physical Exam General:  NAD. No resp difficulty HEENT: Normal Neck: Supple. No JVD. Carotids 2+ bilat; no bruits. No lymphadenopathy or thryomegaly appreciated. Cor: PMI nondisplaced. Regular rate & rhythm. No rubs, gallops. II/VI holosystolic LLSB ?TR Lungs: Clear Abdomen: Soft, nontender, nondistended. No hepatosplenomegaly. No bruits or masses. Good bowel sounds. Extremities: No cyanosis, clubbing, rash, trace ankle edema, compression stockings on. Neuro: alert & oriented x 3, cranial nerves grossly intact.  Moves all 4 extremities w/o difficulty. Affect pleasant.  ReDs: 26%  Assessment/Plan: 1. Pulmonary hypertension: Patient has pulmonary arterial hypertension.  She appears to have co-existing diastolic LV dysfunction given elevated PCWP on  prior RHC in 1/17.  PFTs did not show significant obstruction, they only showed moderately decreased DLCO consistent with pulmonary vascular disease. V/Q scan did not show evidence for chronic PE.  PVR 7.6 WU by Fick and 8.4 WU by thermodiluation on 1/17 RHC.  Cardiac index was low, 1.97 Fick/1.8 thermo. 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  Given worsening symptoms, RHC was repeated in 11/18.  This showed PVR 3.7 WU with mean PA pressure 32 and normal CI.  Most recent echo in 1/22 EF 55-60%, Grade 2 DD, RV moderately down, PASP 116.6 mmHg, moderate TR, mild AS. RHC (1/22): Optimized filling pressures. Severe pulmonary arterial hypertension. RA mean 4, RV 86/9, PA 87/26, mean 47, PCWP mean 9, CO (Fick) 6.78, CI (Fick) 4.36, PVR 5.6 WU. NYHA class IIIb symptoms, somewhat better today.  6 minute walk in 11/21 was worse.  - Continue riociguat 2.5 mg tid.  - She has sleep apnea, now using CPAP.  - Continue Opsumit.  - Currently taking Uptravi 2000 mcg bid as of today. Will try to continue slowly increasing Uptravi up to as high as 2400 mcg bid. 2. Chronic diastolic CHF/RV failure: Elevated PCWP on 1/17 RHC.  Last echo in 1/22 with EF 55-60%. NYHA class IIIb symptoms. Volume stable on exam and by REDs clip 26%. - Continue torsemide 80 mg daily but will take metolazone 2.5 mg every Wednesday. She will add an extra KCl 20 mEq if/when she takes metolazone. - BMET drawn earlier today pending. 3. Atrial fibrillation: Paroxysmal.  She is in NSR today.  She had atrial fibrillation ablation in 6/18. She is now  on flecainide.  - Continue Toprol XL 25 mg daily.  - Continue apixaban. - Continue flecainide.  4. CKD: Stage 3b. ? If CREST syndrome plays a role.  She is now seeing nephrology. Sees Dr. Joelyn Oms, will follow up with him in April 2022. - BMET today pending.  5. CREST syndrome: Seeing Dr. Amil Amen.  6. Anemia: Check ferritin, Fe, TIBC.   Follow-up in 1-2 months with Dr. [name].  Maricela Bo [facility] FNP-BC 09/03/2020

## 2020-09-02 ENCOUNTER — Other Ambulatory Visit (HOSPITAL_COMMUNITY): Payer: Self-pay | Admitting: *Deleted

## 2020-09-03 ENCOUNTER — Encounter (HOSPITAL_COMMUNITY): Payer: Self-pay

## 2020-09-03 ENCOUNTER — Ambulatory Visit (HOSPITAL_COMMUNITY)
Admission: RE | Admit: 2020-09-03 | Discharge: 2020-09-03 | Disposition: A | Discharge status: Home / Self Care | Payer: Medicare PPO | Source: Ambulatory Visit | Attending: Cardiology | Admitting: Cardiology

## 2020-09-03 ENCOUNTER — Other Ambulatory Visit: Payer: Self-pay

## 2020-09-03 ENCOUNTER — Ambulatory Visit (HOSPITAL_BASED_OUTPATIENT_CLINIC_OR_DEPARTMENT_OTHER)
Admission: RE | Admit: 2020-09-03 | Discharge: 2020-09-03 | Disposition: A | Discharge status: Home / Self Care | Payer: Medicare PPO | Source: Ambulatory Visit | Attending: Family Medicine | Admitting: Family Medicine

## 2020-09-03 VITALS — BP 100/62 | HR 65 | Wt 130.0 lb

## 2020-09-03 DIAGNOSIS — M341 CR(E)ST syndrome: Secondary | ICD-10-CM | POA: Insufficient documentation

## 2020-09-03 DIAGNOSIS — I272 Pulmonary hypertension, unspecified: Secondary | ICD-10-CM | POA: Diagnosis not present

## 2020-09-03 DIAGNOSIS — I480 Paroxysmal atrial fibrillation: Secondary | ICD-10-CM | POA: Insufficient documentation

## 2020-09-03 DIAGNOSIS — I5032 Chronic diastolic (congestive) heart failure: Secondary | ICD-10-CM | POA: Diagnosis not present

## 2020-09-03 DIAGNOSIS — N1832 Chronic kidney disease, stage 3b: Secondary | ICD-10-CM

## 2020-09-03 DIAGNOSIS — E611 Iron deficiency: Secondary | ICD-10-CM

## 2020-09-03 LAB — BASIC METABOLIC PANEL
Anion gap: 15 (ref 5–15)
BUN: 48 mg/dL — ABNORMAL HIGH (ref 8–23)
CO2: 27 mmol/L (ref 22–32)
Calcium: 10.3 mg/dL (ref 8.9–10.3)
Chloride: 93 mmol/L — ABNORMAL LOW (ref 98–111)
Creatinine, Ser: 4.64 mg/dL — ABNORMAL HIGH (ref 0.44–1.00)
GFR, Estimated: 9 mL/min — ABNORMAL LOW (ref >60)
Glucose, Bld: 94 mg/dL (ref 70–99)
Potassium: 3.1 mmol/L — ABNORMAL LOW (ref 3.5–5.1)
Sodium: 135 mmol/L (ref 135–145)

## 2020-09-03 MED ORDER — SODIUM CHLORIDE 0.9 % IV SOLN
510.0000 mg | INTRAVENOUS | Status: DC
  Administered 2020-09-03: 510 mg via INTRAVENOUS
  Filled 2020-09-03: qty 17

## 2020-09-03 NOTE — Addendum Note (Signed)
Encounter addended by: Rafael Bihari, FNP on: 09/03/2020 4:22 PM  Actions taken: Clinical Note Signed

## 2020-09-03 NOTE — Patient Instructions (Addendum)
Your physician recommends that you schedule a follow-up appointment in: 2 months  If you have any questions or concerns before your next appointment please send us a message through mychart or call our office at [phone].    TO LEAVE A MESSAGE FOR THE NURSE SELECT OPTION 2, PLEASE LEAVE A MESSAGE INCLUDING: . YOUR NAME . DATE OF BIRTH . CALL BACK NUMBER . REASON FOR CALL**this is important as we prioritize the call backs  YOU WILL RECEIVE A CALL BACK THE SAME DAY AS LONG AS YOU CALL BEFORE 4:00 PM  

## 2020-09-03 NOTE — Progress Notes (Signed)
ReDS Vest / Clip - 09/03/20 1354      ReDS Vest / Clip   Station Marker A    Ruler Value 28    ReDS Value Range Low volume    ReDS Actual Value 26

## 2020-09-04 ENCOUNTER — Telehealth (HOSPITAL_COMMUNITY): Payer: Self-pay

## 2020-09-04 DIAGNOSIS — I5032 Chronic diastolic (congestive) heart failure: Secondary | ICD-10-CM

## 2020-09-04 NOTE — Telephone Encounter (Signed)
-----   Message from Malena Edman, RN sent at 09/03/2020  3:45 PM EST ----- Left message to return call. Take Potassium 69mq per JAllena Katz NP

## 2020-09-04 NOTE — Telephone Encounter (Signed)
Malena Edman, RN  09/04/2020 12:02 PM EST Back to Top     Patient advised and verbalized understanding. Med list updated. Lab appt scheduled and lab orders placed   Malena Edman, RN  09/03/2020 3:45 PM EST      Left message to return call. Take Potassium 55mq per JAllena Katz NP   AMalena Edman RN  09/03/2020 2:43 PM EST      Seen today in APP clinic, discussed in person

## 2020-09-11 ENCOUNTER — Ambulatory Visit (HOSPITAL_COMMUNITY)
Admission: RE | Admit: 2020-09-11 | Discharge: 2020-09-11 | Disposition: A | Discharge status: Home / Self Care | Payer: Medicare PPO | Source: Ambulatory Visit | Attending: Internal Medicine | Admitting: Internal Medicine

## 2020-09-11 ENCOUNTER — Other Ambulatory Visit: Payer: Self-pay

## 2020-09-11 DIAGNOSIS — I5032 Chronic diastolic (congestive) heart failure: Secondary | ICD-10-CM | POA: Diagnosis not present

## 2020-09-11 LAB — BASIC METABOLIC PANEL
Anion gap: 13 (ref 5–15)
BUN: 50 mg/dL — ABNORMAL HIGH (ref 8–23)
CO2: 26 mmol/L (ref 22–32)
Calcium: 12 mg/dL — ABNORMAL HIGH (ref 8.9–10.3)
Chloride: 99 mmol/L (ref 98–111)
Creatinine, Ser: 4.61 mg/dL — ABNORMAL HIGH (ref 0.44–1.00)
GFR, Estimated: 9 mL/min — ABNORMAL LOW (ref >60)
Glucose, Bld: 113 mg/dL — ABNORMAL HIGH (ref 70–99)
Potassium: 4.5 mmol/L (ref 3.5–5.1)
Sodium: 138 mmol/L (ref 135–145)

## 2020-09-12 ENCOUNTER — Telehealth (HOSPITAL_COMMUNITY): Payer: Self-pay

## 2020-09-12 ENCOUNTER — Encounter: Payer: Self-pay | Admitting: Internal Medicine

## 2020-09-12 DIAGNOSIS — I5032 Chronic diastolic (congestive) heart failure: Secondary | ICD-10-CM

## 2020-09-12 NOTE — Telephone Encounter (Signed)
Patient advised and verbalized understanding. Lab appt scheduled,lab order entered  Orders Placed This Encounter  Procedures  . Basic Metabolic Panel (BMET)    Standing Status:   Future    Standing Expiration Date:   09/12/2021    Order Specific Question:   Release to patient    Answer:   Immediate

## 2020-09-12 NOTE — Telephone Encounter (Signed)
-----   Message from Larey Dresser, MD sent at 09/11/2020 11:56 AM EST ----- Creatinine is unchanged (not worse, not better).  Continue current plan.  Will need BMET again in a week and make sure she has nephrology followup.

## 2020-09-17 ENCOUNTER — Other Ambulatory Visit: Payer: Self-pay

## 2020-09-17 ENCOUNTER — Other Ambulatory Visit (INDEPENDENT_AMBULATORY_CARE_PROVIDER_SITE_OTHER): Payer: Medicare PPO

## 2020-09-17 ENCOUNTER — Other Ambulatory Visit: Payer: Self-pay | Admitting: Internal Medicine

## 2020-09-17 DIAGNOSIS — E0500 Thyrotoxicosis with diffuse goiter without thyrotoxic crisis or storm: Secondary | ICD-10-CM

## 2020-09-17 LAB — TSH: TSH: 4.82 u[IU]/mL — ABNORMAL HIGH (ref 0.35–4.50)

## 2020-09-17 LAB — T4, FREE: Free T4: 1.03 ng/dL (ref 0.60–1.60)

## 2020-09-17 LAB — T3, FREE: T3, Free: 2.2 pg/mL — ABNORMAL LOW (ref 2.3–4.2)

## 2020-09-19 ENCOUNTER — Ambulatory Visit (HOSPITAL_COMMUNITY)
Admission: RE | Admit: 2020-09-19 | Discharge: 2020-09-19 | Disposition: A | Discharge status: Home / Self Care | Payer: Medicare PPO | Source: Ambulatory Visit | Attending: Cardiology | Admitting: Cardiology

## 2020-09-19 ENCOUNTER — Other Ambulatory Visit: Payer: Self-pay

## 2020-09-19 DIAGNOSIS — I5032 Chronic diastolic (congestive) heart failure: Secondary | ICD-10-CM | POA: Diagnosis not present

## 2020-09-19 LAB — BASIC METABOLIC PANEL
Anion gap: 10 (ref 5–15)
BUN: 43 mg/dL — ABNORMAL HIGH (ref 8–23)
CO2: 24 mmol/L (ref 22–32)
Calcium: 11.1 mg/dL — ABNORMAL HIGH (ref 8.9–10.3)
Chloride: 102 mmol/L (ref 98–111)
Creatinine, Ser: 4.36 mg/dL — ABNORMAL HIGH (ref 0.44–1.00)
GFR, Estimated: 10 mL/min — ABNORMAL LOW (ref >60)
Glucose, Bld: 105 mg/dL — ABNORMAL HIGH (ref 70–99)
Potassium: 4.4 mmol/L (ref 3.5–5.1)
Sodium: 136 mmol/L (ref 135–145)

## 2020-09-24 DIAGNOSIS — I2721 Secondary pulmonary arterial hypertension: Secondary | ICD-10-CM | POA: Diagnosis not present

## 2020-09-24 DIAGNOSIS — E663 Overweight: Secondary | ICD-10-CM | POA: Diagnosis not present

## 2020-09-24 DIAGNOSIS — Z6826 Body mass index (BMI) 26.0-26.9, adult: Secondary | ICD-10-CM | POA: Diagnosis not present

## 2020-09-24 DIAGNOSIS — N1832 Chronic kidney disease, stage 3b: Secondary | ICD-10-CM | POA: Diagnosis not present

## 2020-09-24 DIAGNOSIS — M340 Progressive systemic sclerosis: Secondary | ICD-10-CM | POA: Diagnosis not present

## 2020-09-24 DIAGNOSIS — D8989 Other specified disorders involving the immune mechanism, not elsewhere classified: Secondary | ICD-10-CM | POA: Diagnosis not present

## 2020-10-03 DIAGNOSIS — M341 CR(E)ST syndrome: Secondary | ICD-10-CM | POA: Diagnosis not present

## 2020-10-03 DIAGNOSIS — I270 Primary pulmonary hypertension: Secondary | ICD-10-CM | POA: Diagnosis not present

## 2020-10-03 DIAGNOSIS — D631 Anemia in chronic kidney disease: Secondary | ICD-10-CM | POA: Diagnosis not present

## 2020-10-03 DIAGNOSIS — I5030 Unspecified diastolic (congestive) heart failure: Secondary | ICD-10-CM | POA: Diagnosis not present

## 2020-10-03 DIAGNOSIS — N184 Chronic kidney disease, stage 4 (severe): Secondary | ICD-10-CM | POA: Diagnosis not present

## 2020-10-03 LAB — BASIC METABOLIC PANEL
BUN: 40 — AB (ref 4–21)
CO2: 25 — AB (ref 13–22)
Chloride: 102 (ref 99–108)
Creatinine: 3.5 — AB (ref 0.5–1.1)
Glucose: 90
Potassium: 4.1 (ref 3.4–5.3)
Sodium: 137 (ref 137–147)

## 2020-10-03 LAB — COMPREHENSIVE METABOLIC PANEL
Albumin: 3.9 (ref 3.5–5.0)
Calcium: 11.5 — AB (ref 8.7–10.7)

## 2020-10-03 LAB — CBC AND DIFFERENTIAL: Hemoglobin: 11.3 — AB (ref 12.0–16.0)

## 2020-10-14 ENCOUNTER — Ambulatory Visit (INDEPENDENT_AMBULATORY_CARE_PROVIDER_SITE_OTHER): Payer: Medicare PPO | Admitting: Cardiovascular Disease

## 2020-10-14 ENCOUNTER — Other Ambulatory Visit: Payer: Self-pay

## 2020-10-14 ENCOUNTER — Encounter: Payer: Self-pay | Admitting: Cardiovascular Disease

## 2020-10-14 VITALS — BP 100/60 | HR 64 | Ht 60.0 in | Wt 140.2 lb

## 2020-10-14 DIAGNOSIS — I480 Paroxysmal atrial fibrillation: Secondary | ICD-10-CM

## 2020-10-14 DIAGNOSIS — I272 Pulmonary hypertension, unspecified: Secondary | ICD-10-CM

## 2020-10-14 DIAGNOSIS — G4733 Obstructive sleep apnea (adult) (pediatric): Secondary | ICD-10-CM

## 2020-10-14 DIAGNOSIS — M341 CR(E)ST syndrome: Secondary | ICD-10-CM

## 2020-10-14 DIAGNOSIS — Z9989 Dependence on other enabling machines and devices: Secondary | ICD-10-CM

## 2020-10-14 DIAGNOSIS — N184 Chronic kidney disease, stage 4 (severe): Secondary | ICD-10-CM | POA: Diagnosis not present

## 2020-10-14 NOTE — Progress Notes (Signed)
Cardiology Office Note    Date:  10/20/2020   ID:  CATLYN SHIPTON, DOB Dec 25, 1945, MRN [id number]  PCP:  [name], MD  Cardiologist:  Shelva Majestic, MD (sleep); Dr. [name]  13 month F/U F/U sleep evaluation  History of Present Illness:  Tiffany [name] is a 75 y.o. female who presents for one-year follow-up sleep evaluation.   She is followed by Dr. [name].  Ms. Tiffany [name] has a history of paroxysmal atrial fibrillation and diastolic heart failure and was found to have pulmonary hypertension.  She admits to dyspnea.  She had developed an episode of atrial fibrillation and underwent cardioversion in June 2016.  She was started on amiodarone but this was stopped secondary to dyspnea.  A nuclear perfusion study in September 2016 did not reveal ischemia or infarction.  She was found to have elevated left and right heart filling pressures as well as pulmonary hypertension at catheterization and her PA pressure was increased up to 85 mm on echo.  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  Because of her pulmonary hypertension, she was referred for a sleep study which was done on 04/10/2016.  She met split-night criteria and was found to have severe obstructive sleep apnea with an HIF 59.4 per hour on the diagnostic study.  She did not have evidence for central apneic events.  Oxygen desaturated to a nadir of 80%.  There was moderate snoring.  She underwent CPAP titration and a 13 cm water pressure was recommended.   CPAP was initiated on 05/18/2016.  When I saw her for initial evaluation, a download obtained from 06/13/2016 through 07/12/2016 demonstrated 100% compliance abuse and usage greater than 4 hours. She was averaging 9 hours and 13 minutes of sleep per night.  At 13 cmwater pressure, AHI was  2.5.   Over the past year, she has continued to use CPAP with 1% compliance.  A new download was obtained from 06/21/2017 through 07/20/2017.  This confirms 100%  compliance.  She is averaging 9 hours and 26 minutes of sleep per night.  At her 13 cm water pressure, AHI is excellent at 2.7.  There is no significant leak.  She is sleeping well.  She denies any daytime sleepiness.  She is unaware of any breakthrough snoring.  In the office today.  I recalculated an Epworth Sleepiness Scale score, and this was excellent and endorsed at 1.   She saw Dr. [name] advanced heart failure clinic in November 2018.  She has a history of atrial fibrillation and pulmonary hypertension with diastolic heart failure.  She had undergone a right heart catheterization.  Her pulmonary pressures have significant improved with CPAP treatment.   I last saw her in December 2018 at which time she was doing well from a sleep perspective.  Compliance was excellent.  AHI was excellent at 2.7.   An echo Doppler study in December 2019 showed an EF of 65 to 70% with moderate diastolic dysfunction, mild left ear, mild MR, PASP 47 mm and normal RV.  When I saw the office in January 2020 A  download was obtained in the office  from August 02, 2018 through August 31, 2018.  Compliance was excellent at 100%.  She was averaging 9 hours and 7 minutes of CPAP use per night.  At a set pressure of 13 cm, AHI 0.9.  There is no leak.  She was unaware of any breakthrough snoring.  Her sleep  is restorative.  She denied residual daytime sleepiness.  At that evaluation she had noticed some occasional palpitations and was planning a follow-up visit with [name]. [name], PA in atrial fibrillation clinic.  I last saw her on September 05, 2019.  Over the prior year she continued to be followed by Dr. [name] for her diastolic heart failure and pulmonary hypertension.  She denied dyspnea while walking on flat ground.  An echo in December 2019 showed an EF of 65 to 70%.  Her most recent echo from August 17, 2019 reveals an EF of 60 to 65%, there was mild LA dilatation, mild to moderate RA dilatation, mild MR, mild to  moderate TR with mild aortic valve sclerosis without stenosis.  There was evidence for right ventricular pressure overload.  There was severe pulmonary hypertension with estimated RV systolic pressure at 61.6 mmHg. She had class II symptoms.  She was maintaining sinus rhythm on flecainide and she has been maintained on Opsumit and selexipag. From a sleep perspective, she has continued to use CPAP with excellent compliance.  A new download was obtained from August 06, 2019 through September 04, 2019.  Usage is excellent and she is averaging 9 hours and 13 minutes of CPAP use per night.  At her 13 cm water pressure, AHI is 1.4/h.  She has been using nasal pillow mask and tolerates this well with hardly any leak.  She denies any significant residual daytime sleepiness.    Since I last saw her, she has continued to be stable and has denied any chest pain or shortness of breath.  She has felt well.  She was diagnosed with crest syndrome and sees Dr. [name].  She is followed by Dr. Joelyn Oms for chronic kidney disease and may ultimately require dialysis.  She admits to excellent CPAP use.  A download was obtained from February 9 through October 10, 2020 which confirms 100% use with average use at 10 hours of 1 minute per night.  At a 13 cm set pressure, AHI is excellent at 2.4.  [facility] is her DME company.  She has been using nasal pillow mask and notes occasional irritation . She presents for follow-up sleep evaluation.  Past Medical History:  Diagnosis Date  . Arthritis 04-20-12   Osteoarthritis-right hip  . Atrial fibrillation status post cardioversion, 01/30/15 maintaining SR.  01/31/2015   a. 01/2015 s/p TEE/DCCV;  b. 02/06/2015 recurrent AF noted, amio added;  c. CHA2DS2VASc = 3-->eliquis.  Marland Kitchen BENIGN POSITIONAL VERTIGO   . Cataract   . CHF (congestive heart failure) ([place])   . Complication of anesthesia 04-20-12   some issues with prolonged sedation after anesthesia  . Diverticulosis   . DYSLIPIDEMIA    . Esophageal stricture   . GERD   . HYPERTENSION    a. severe, pt intol of med tx, Pt. has severe "whitecoat" syndrome and refused medical therapy.  . Hypothyroidism 09/29/2014   a. pt did not tolerate synthroid and this was subsequently discontinued.  . Mild LV dysfunction    a. 01/2015 Echo: EF 45-50%, mild MR, mildly dil LA, mod dil RV with mod to sev reduced fxn, mod-sev TR, PASP 70mHg.  . Osteopenia   . Raynaud disease   . URINARY INCONTINENCE 04-20-12   occ. with nighttime sleep pattern    Past Surgical History:  Procedure Laterality Date  . ATRIAL FIBRILLATION ABLATION N/A 01/26/2017   Procedure: Atrial Fibrillation Ablation;  Surgeon: AThompson Grayer MD;  [facility];  Service: Cardiovascular;  Laterality: N/A;  . breast ultrasound Right 09/13/13   There is no sonographic evidence of malignancy. the 4.9ZP complicated cyst in (R) breast is consistent with a benign finding. repeat in 1 year  . CARDIAC CATHETERIZATION N/A 08/29/2015   Procedure: Right Heart Cath;  Surgeon: Larey Dresser, MD;  Location: [place] [facility];  Service: Cardiovascular;  Laterality: N/A;  . CARDIOVERSION N/A 01/30/2015   Procedure: CARDIOVERSION;  Surgeon: Sanda Klein, MD;  Location: [facility];  Service: Cardiovascular;  Laterality: N/A;  . CHOLECYSTECTOMY     '90-"sludge"  . NASAL FRACTURE SURGERY  2006  . RIGHT HEART CATH N/A 06/30/2017   Procedure: RIGHT HEART CATH;  Surgeon: Larey Dresser, MD;  Location: [facility];  Service: Cardiovascular;  Laterality: N/A;  . RIGHT HEART CATH N/A 08/09/2020   Procedure: RIGHT HEART CATH;  Surgeon: Larey Dresser, MD;  Location: [place] [facility];  Service: Cardiovascular;  Laterality: N/A;  . TEE WITHOUT CARDIOVERSION N/A 01/30/2015   Procedure: TRANSESOPHAGEAL ECHOCARDIOGRAM (TEE);  Surgeon: Sanda Klein, MD;  Location: [facility];  Service: Cardiovascular;  Laterality: N/A;  . TEE WITHOUT CARDIOVERSION N/A 01/25/2017    Procedure: TRANSESOPHAGEAL ECHOCARDIOGRAM (TEE);  Surgeon: Fay Records, MD;  Location: [facility] ENDOSCOPY;  Service: Cardiovascular;  Laterality: N/A;  . TOTAL HIP ARTHROPLASTY  04/26/2012   Procedure: TOTAL HIP ARTHROPLASTY ANTERIOR APPROACH;  Surgeon: Mauri Pole, MD;  Location: [facility];  Service: Orthopedics;  Laterality: Right;  . TUBAL LIGATION  1980    Current Medications: Outpatient Medications Prior to Visit  Medication Sig Dispense Refill  . acetaminophen (TYLENOL) 325 MG tablet Take 2 tablets (650 mg total) by mouth every 4 (four) hours as needed for headache or mild pain.    Marland Kitchen ELIQUIS 5 MG TABS tablet TAKE 1 TABLET BY MOUTH TWICE A DAY 180 tablet 1  . famotidine (PEPCID) 20 MG tablet TAKE 1 TABLET BY MOUTH TWICE A DAY 180 tablet 3  . flecainide (TAMBOCOR) 50 MG tablet TAKE 1 TABLET BY MOUTH TWICE A DAY 180 tablet 2  . fluticasone (FLONASE) 50 MCG/ACT nasal spray Place 1 spray daily as needed into both nostrils for allergies or rhinitis.    . macitentan (OPSUMIT) 10 MG tablet Take 1 tablet (10 mg total) by mouth daily. 30 tablet 11  . Melatonin 2.5 MG CHEW Chew 2.5 mg by mouth as needed.    . metoprolol succinate (TOPROL-XL) 25 MG 24 hr tablet TAKE 1 TABLET BY MOUTH EVERY DAY 90 tablet 3  . potassium chloride (KLOR-CON) 10 MEQ tablet Take 10 mEq by mouth 2 (two) times daily. Patient takes 2 tablets twice daily    . Probiotic Product (PROBIOTIC-10) CAPS Take 1 capsule by mouth 3 (three) times a week.     . Riociguat (ADEMPAS) 2.5 MG TABS Take 2.5 mg by mouth 3 (three) times daily. 90 tablet 11  . SELEXIPAG PO Take 2,200 mcg by mouth in the morning and at bedtime. Patient takes 4 tablets twice daily total 2200 mcg    . torsemide (DEMADEX) 20 MG tablet Take 4 tablets (80 mg total) by mouth daily. 360 tablet 3  . Calcium Carb-Cholecalciferol (CALCIUM 600+D) 600-800 MG-UNIT TABS Take 600 mg by mouth daily.    . Melatonin 2.5 MG CAPS Take by mouth at bedtime.    . potassium chloride  (KLOR-CON) 10 MEQ tablet Take 4 tablets (40 mEq total) by mouth daily. Take extra 73mq with metolazone on wednesdays  360 tablet 1  . Selexipag (UPTRAVI) 1000 MCG TABS Take 2,000 mcg by mouth 2 (two) times daily.     No facility-administered medications prior to visit.     Allergies:   Levothyroxine, Statins, Prednisone, and Penicillins   Social History   Socioeconomic History  . Marital status: Married    Spouse name: Not on file  . Number of children: 2  . Years of education: Not on file  . Highest education level: Not on file  Occupational History  . Not on file  Tobacco Use  . Smoking status: Never Smoker  . Smokeless tobacco: Never Used  Vaping Use  . Vaping Use: Never used  Substance and Sexual Activity  . Alcohol use: Yes    Alcohol/week: 2.0 standard drinks    Types: 2 Glasses of wine per week    Comment: several glasses wine weekly  . Drug use: No  . Sexual activity: Yes  Other Topics Concern  . Not on file  Social History Narrative   She enjoys birding.  Lives at home with husband.   Married.   retired Quarry manager, Tax inspector   Social Determinants of Radio broadcast assistant Strain: Not on [employer] Insecurity: Not on file  Transportation Needs: Not on file  Physical Activity: Not on file  Stress: Not on file  Social Connections: Not on file     Family History:  The patient's family history includes Other in an other family member. She was adopted.   ROS General: Negative; No fevers, chills, or night sweats;  HEENT: Negative; No changes in vision or hearing, sinus congestion, difficulty swallowing Pulmonary: Negative; No cough, wheezing, shortness of breath, hemoptysis Cardiovascular: History of diastolic heart failure, pulmonary hypertension GI: Negative; No nausea, vomiting, diarrhea, or abdominal pain GU: CKD  Musculoskeletal: CRESTsyndrome Hematologic/Oncology: Negative; no easy bruising, bleeding Endocrine: Negative;  no heat/cold intolerance; no diabetes Neuro: Negative; no changes in balance, headaches Skin: Negative; No rashes or skin lesions Psychiatric: Negative; No behavioral problems, depression Sleep: Positive for severe sleep apnea.  Since initiating CPAP therapy there is no residual snoring, daytime sleepiness, hypersomnolence, bruxism, restless legs, hypnogognic hallucinations, no cataplexy Other comprehensive 14 point system review is negative.   PHYSICAL EXAM:   VS:  BP 100/60 (BP Location: Left Arm, Patient Position: Sitting)   Pulse 64   Ht 5' (1.524 m)   Wt 140 lb 3.2 oz (63.6 kg)   SpO2 99%   BMI 27.38 kg/m     Repeat blood pressure by me 98/62  Wt Readings from Last 3 Encounters:  10/15/20 140 lb (63.5 kg)  10/14/20 140 lb 3.2 oz (63.6 kg)  09/03/20 130 lb (59 kg)    General: Alert, oriented, no distress.  Skin: normal turgor, no rashes, warm and dry HEENT: Normocephalic, atraumatic. Pupils equal round and reactive to light; sclera anicteric; extraocular muscles intact;  Nose without nasal septal hypertrophy Mouth/Parynx benign; Mallinpatti scale 3 Neck: No JVD, no carotid bruits; normal carotid upstroke Lungs: clear to ausculatation and percussion; no wheezing or rales Chest wall: without tenderness to palpitation Heart: PMI not displaced, RRR, s1 s2 normal, 1/6 systolic murmur, no diastolic murmur, no rubs, gallops, thrills, or heaves Abdomen: soft, nontender; no hepatosplenomehaly, BS+; abdominal aorta nontender and not dilated by palpation. Back: no CVA tenderness Pulses 2+ Musculoskeletal: full range of motion, normal strength, no joint deformities Extremities: no clubbing cyanosis or edema, Homan's sign negative  Neurologic: grossly nonfocal; Cranial nerves  grossly wnl Psychologic: Normal mood and affect   Studies/Labs Reviewed:   ECG (independently read by me): NSR at 64; T wave abnormality 2,3,aVF, V3-6  September 05, 2019 ECG (independently read by me): Normal  sinus rhythm at 69 bpm.  Incomplete right bundle branch block.  QS complex V1 V2.  Mild T wave abnormality.  QTc interval 465 msec  January 2020 ECG (independently read by me): Sinus rhythm at 79 bpm with frequent PACs, possible left atrial enlargement.  QS V1 V2   An independent review of her last ECG from 08/08/2015 reveals normal sinus rhythm at 70 bpm.  There is left axis deviation and incomplete right bundle branch block.  Recent Labs: BMP Latest Ref Rng & Units 10/03/2020 09/19/2020 09/11/2020  Glucose 70 - 99 mg/dL - 105(H) 113(H)  BUN 4 - 21 40(A) 43(H) 50(H)  Creatinine 0.5 - 1.1 3.5(A) 4.36(H) 4.61(H)  BUN/Creat Ratio 12 - 28 - - -  Sodium 137 - 147 137 136 138  Potassium 3.4 - 5.3 4.1 4.4 4.5  Chloride 99 - 108 102 102 99  CO2 13 - 22 25(A) 24 26  Calcium 8.7 - 10.7 11.5(A) 11.1(H) 12.0(H)     Hepatic Function Latest Ref Rng & Units 10/03/2020 04/19/2019 09/01/2018  Total Protein 6.0 - 8.3 g/dL - 7.6 7.6  Albumin 3.5 - 5.0 3.9 4.3 4.0  AST 0 - 37 U/L - 19 15  ALT 0 - 35 U/L - 14 11  Alk Phosphatase 39 - 117 U/L - 54 55  Total Bilirubin 0.2 - 1.2 mg/dL - 0.8 0.5  Bilirubin, Direct 0.0 - 0.3 mg/dL - - -    CBC Latest Ref Rng & Units 10/03/2020 08/09/2020 08/09/2020  WBC 4.0 - 10.5 K/uL - - -  Hemoglobin 12.0 - 16.0 11.3(A) 10.2(L) 10.2(L)  Hematocrit 36.0 - 46.0 % - 30.0(L) 30.0(L)  Platelets 150 - 400 K/uL - - -   Lab Results  Component Value Date   MCV 82.5 08/06/2020   MCV 83.5 07/29/2020   MCV 86.2 04/17/2020   Lab Results  Component Value Date   TSH 4.82 (H) 09/17/2020   Lab Results  Component Value Date   HGBA1C 5.2 04/19/2019     BNP    Component Value Date/Time   BNP 268.4 (H) 11/07/2019 1146    ProBNP    Component Value Date/Time   PROBNP 108.0 (H) 09/01/2018 1100     Lipid Panel     Component Value Date/Time   CHOL 188 04/19/2019 1229   TRIG 163.0 (H) 04/19/2019 1229   TRIG 185 02/11/2009 0000   HDL 49.70 04/19/2019 1229   CHOLHDL 4  04/19/2019 1229   VLDL 32.6 04/19/2019 1229   LDLCALC 105 (H) 04/19/2019 1229     RADIOLOGY: CT ABDOMEN PELVIS WO CONTRAST  Result Date: 10/17/2020 CLINICAL DATA:  Mid abdominal pain, incisional hernia, multiple hernias, atrial fibrillation, cholecystectomy, hypertension, GERD, CHF EXAM: CT ABDOMEN AND PELVIS WITHOUT CONTRAST TECHNIQUE: Multidetector CT imaging of the abdomen and pelvis was performed following the standard protocol without IV contrast. Sagittal and coronal MPR images reconstructed from axial data set. No oral contrast administered. COMPARISON:  08/08/2015 FINDINGS: Lower chest: Peribronchial thickening and interseptal thickening in the lower lobes. Subsegmental atelectasis BILATERAL lower lobes. Calcified granuloma RIGHT lower lobe. Small RIGHT pleural effusion. Minimal patchy bibasilar infiltrate. Hepatobiliary: Gallbladder surgically absent.  Liver unremarkable. Pancreas: Low-attenuation region at pancreatic head suspicious for subtle pancreatic mass measuring 2.6 x 2.0 cm. Remainder of  pancreas normal appearance. Spleen: Normal appearance Adrenals/Urinary Tract: Adrenal thickening without mass. Kidneys, ureters, and bladder normal appearance Stomach/Bowel: Scattered colonic diverticula. No evidence of diverticulitis. Stomach decompressed. Bowel loops otherwise normal appearance. Appendix not definitely visualized. Vascular/Lymphatic: Atherosclerotic calcifications aorta and iliac arteries without aneurysm. No adenopathy. Scattered pelvic phleboliths. Reproductive: Unremarkable uterus and adnexa Other: Umbilical hernia containing fat and minimal fluid. No bowel herniation. Several additional tiny supraumbilical ventral hernias are seen containing fat and minimal fluid. No free air or ascites. Musculoskeletal: Osseous demineralization. Beam hardening artifacts in pelvis from RIGHT hip prosthesis. Mild degenerative disc and facet disease changes of lumbar spine. IMPRESSION: Umbilical hernia  containing fat and minimal fluid. Several additional tiny supraumbilical ventral hernias containing fat and minimal fluid. Colonic diverticulosis without evidence of diverticulitis. Low-attenuation region at pancreatic head question subtle pancreatic mass 2.0 x 2.6 cm; follow-up non emergent MR imaging with and without contrast recommended to exclude neoplasm. Small RIGHT pleural effusion with bibasilar atelectasis and minimal patchy bibasilar infiltrates. Aortic Atherosclerosis (ICD10-I70.0). Electronically Signed   By: Lavonia Dana M.D.   On: 10/17/2020 14:42     Additional studies/ records that were reviewed today include:  I review the subsequent office records of Dr. [name], the patient's sleep study, ECG, and obtained a new download    ASSESSMENT:    1. Paroxysmal atrial fibrillation (HCC)   2. OSA on CPAP   3. Pulmonary hypertension ([place])   4. CREST syndrome ([place])   5. 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 Kynslee Baham is a very pleasant 75 year old female who has a history of paroxysmal atrial fibrillation and diastolic heart failure and was found to have significant pulmonary hypertension.  Her initial sleep study in September 2017 revealed severe sleep apnea with an AHI of 59.4/h and significant oxygen desaturation to a nadir of 80%.  She had reduced sleep efficiency and moderate snoring.  She has been on CPAP therapy at 13 cm since 2017 over the last several years has consistently demonstrated excellent compliance.  A download from January 3 through September 04, 2019 shows an average sleep duration at 9 hours and 13 minutes.  AHI is 1.4 at a 13 cm set pressure.  She is tolerated nasal pillows.  She does not have any significant leak.  At that time, I discussed significant mask alternatives which have become available since 2013 but she preferred to stay on the mask which he currently was on.  Presently, she continues to have some excellent compliance with CPAP and her most  recent download at 13 cm shows an AHI of 2.4 with 100% use and average use at 10 hours and 1 minute per night.  Since she has been using nasal pillows I suggested a trial of the ResMed air fit N 30i which fits under the nose rather than nasal pillows which at times can cause some nares irritation.  She continues to be followed by Dr. Algernon Huxley for severe pulmonary hypertension.  Remote PVR was 7.6W in 2017.  She has not had recurrent atrial fibrillation and continues to be on flecainide.  She is anticoagulated on Eliquis.  She is developed progressive renal dysfunction and is followed closely by Dr. Joelyn Oms.  She was diagnosed with dress syndrome.  She may require future dialysis.  From a sleep perspective she is stable and per Medicare requirements I will see her in 1 year for reevaluation. .   Medication Adjustments/Labs and Tests Ordered: Current medicines are reviewed at  length with the patient today.  Concerns regarding medicines are outlined above.  Medication changes, Labs and Tests ordered today are listed in the Patient Instructions below.  Patient Instructions  Medication Instructions:  NO CHANGES  *If you need a refill on your cardiac medications before your next appointment, please call your pharmacy*   Lab Work:  NOT NEEDED   Testing/Procedures:  NOT NEEDED  Follow-Up: At [facility], you and your health needs are our priority.  As part of our continuing mission to provide you with exceptional heart care, we have created designated Provider Care Teams.  These Care Teams include your primary Cardiologist (physician) and Advanced Practice Providers (APPs -  Physician Assistants and Nurse Practitioners) who all work together to provide you with the care you need, when you need it.     Your next appointment:   12 month(s) SLEEP CLINIC  The format for your next appointment:   In Person  Provider:   Shelva Majestic, MD      Time spent: 30 minutes Signed, Shelva Majestic,  MD  10/20/2020 4:37 PM    [facility] [address] [place], [place], [place]  [address] Phone: [phone]       Cardiology Office Note    Date:  10/20/2020   ID:  FLOELLA ENSZ, DOB 25-Jun-1946, MRN [id number]  PCP:  [name], MD  Cardiologist:  Shelva Majestic, MD (sleep); Dr. [name]  New sleep evaluation  History of Present Illness:  Tiffany [name] is a 75 y.o. female who presents for follow-up sleep evaluation.  I last saw her in December 2018.   Ms. withdrew has a history of paroxysmal atrial fibrillation and diastolic heart failure and was found to have pulmonary hypertension.  She admits to dyspnea.  She had developed an episode of atrial fibrillation and underwent cardioversion in June 2016.  She was started on amiodarone but this was stopped secondary to dyspnea.  A nuclear perfusion study in September 2016 did not reveal ischemia or infarction.  She was found to have elevated left and right heart filling pressures as well as pulmonary hypertension at catheterization and her PA pressure was increased up to 85 mm on echo.  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  Because of her pulmonary hypertension, she was referred for a sleep study which was done on 04/10/2016.  She met split-night criteria and was found to have severe obstructive sleep apnea with an HIF 59.4 per hour on the diagnostic study.  She did not have evidence for central apneic events.  Oxygen desaturated to a nadir of 80%.  There was moderate snoring.  She underwent CPAP titration and a 13 cm water pressure was recommended.  Her CPAP was set up following her CPAP titration.  A download obtained from 06/13/2016 through 07/12/2016 shows 100% compliance abuse and usage greater than 4 hours.  She is averaging 9 hours and 13 minutes of sleep per night.  At 13.  Senna meter water pressure, AHI is 2.5.  She is sleeping well.  She is unaware of breakthrough  snoring.  She denies frequent awakenings.  There is no daytime sleepiness.  In a form sleepiness scale was calculated and endorsed at 2 shown below.  Epworth Sleepiness Scale: Situation   Chance of Dozing/Sleeping (0 = never , 1 = slight chance , 2 = moderate chance , 3 = high chance )   sitting and reading 1  watching TV 0   sitting inactive in a public place 0   being a passenger in a motor vehicle for an hour or more 0   lying down in the afternoon 1   sitting and talking to someone 0   sitting quietly after lunch (no alcohol) 0   while stopped for a few minutes in traffic as the driver 0   Total Score  2      Past Medical History:  Diagnosis Date  . Arthritis 04-20-12   Osteoarthritis-right hip  . Atrial fibrillation status post cardioversion, 01/30/15 maintaining SR.  01/31/2015   a. 01/2015 s/p TEE/DCCV;  b. 02/06/2015 recurrent AF noted, amio added;  c. CHA2DS2VASc = 3-->eliquis.  Marland Kitchen BENIGN POSITIONAL VERTIGO   . Cataract   . CHF (congestive heart failure) ([place])   . Complication of anesthesia 04-20-12   some issues with prolonged sedation after anesthesia  . Diverticulosis   . DYSLIPIDEMIA   . Esophageal stricture   . GERD   . HYPERTENSION    a. severe, pt intol of med tx, Pt. has severe "whitecoat" syndrome and refused medical therapy.  . Hypothyroidism 09/29/2014   a. pt did not tolerate synthroid and this was subsequently discontinued.  . Mild LV dysfunction    a. 01/2015 Echo: EF 45-50%, mild MR, mildly dil LA, mod dil RV with mod to sev reduced fxn, mod-sev TR, PASP 65mHg.  . Osteopenia   . Raynaud disease   . URINARY INCONTINENCE 04-20-12   occ. with nighttime sleep pattern    Past Surgical History:  Procedure Laterality Date  . ATRIAL FIBRILLATION ABLATION N/A 01/26/2017   Procedure: Atrial Fibrillation Ablation;  Surgeon: AThompson Grayer MD;  [facility];  Service: Cardiovascular;  Laterality: N/A;  . breast ultrasound Right 09/13/13   There is no  sonographic evidence of malignancy. the [id number] cyst in (R) breast is consistent with a benign finding. repeat in 1 year  . CARDIAC CATHETERIZATION N/A 08/29/2015   Procedure: Right Heart Cath;  Surgeon: DLarey Dresser MD;  [facility];  Service: Cardiovascular;  Laterality: N/A;  . CARDIOVERSION N/A 01/30/2015   Procedure: CARDIOVERSION;  Surgeon: MSanda Klein MD;  Location: [facility];  Service: Cardiovascular;  Laterality: N/A;  . CHOLECYSTECTOMY     '90-"sludge"  . NASAL FRACTURE SURGERY  2006  . RIGHT HEART CATH N/A 06/30/2017   Procedure: RIGHT HEART CATH;  Surgeon: MLarey Dresser MD;  [facility];  Service: Cardiovascular;  Laterality: N/A;  . RIGHT HEART CATH N/A 08/09/2020   Procedure: RIGHT HEART CATH;  Surgeon: MLarey Dresser MD;  [facility];  Service: Cardiovascular;  Laterality: N/A;  . TEE WITHOUT CARDIOVERSION N/A 01/30/2015   Procedure: TRANSESOPHAGEAL ECHOCARDIOGRAM (TEE);  Surgeon: MSanda Klein MD;  [facility];  Service: Cardiovascular;  Laterality: N/A;  . TEE WITHOUT CARDIOVERSION N/A 01/25/2017   Procedure: TRANSESOPHAGEAL ECHOCARDIOGRAM (TEE);  Surgeon: RFay Records MD;  [facility];  Service: Cardiovascular;  Laterality: N/A;  . TOTAL HIP ARTHROPLASTY  04/26/2012   Procedure: TOTAL HIP ARTHROPLASTY ANTERIOR APPROACH;  Surgeon: MMauri Pole MD;  Location: [facility];  Service: Orthopedics;  Laterality: Right;  . TUBAL LIGATION  1980    Current Medications: Outpatient Medications Prior to Visit  Medication Sig Dispense Refill  . acetaminophen (TYLENOL) 325 MG tablet Take 2 tablets (650 mg total) by mouth every 4 (four) hours as needed for headache or  mild pain.    Marland Kitchen ELIQUIS 5 MG TABS tablet TAKE 1 TABLET BY MOUTH TWICE A DAY 180 tablet 1  . famotidine (PEPCID) 20 MG tablet TAKE 1 TABLET BY MOUTH TWICE A DAY 180 tablet 3  . flecainide (TAMBOCOR) 50 MG tablet TAKE 1 TABLET BY  MOUTH TWICE A DAY 180 tablet 2  . fluticasone (FLONASE) 50 MCG/ACT nasal spray Place 1 spray daily as needed into both nostrils for allergies or rhinitis.    . macitentan (OPSUMIT) 10 MG tablet Take 1 tablet (10 mg total) by mouth daily. 30 tablet 11  . Melatonin 2.5 MG CHEW Chew 2.5 mg by mouth as needed.    . metoprolol succinate (TOPROL-XL) 25 MG 24 hr tablet TAKE 1 TABLET BY MOUTH EVERY DAY 90 tablet 3  . potassium chloride (KLOR-CON) 10 MEQ tablet Take 10 mEq by mouth 2 (two) times daily. Patient takes 2 tablets twice daily    . Probiotic Product (PROBIOTIC-10) CAPS Take 1 capsule by mouth 3 (three) times a week.     . Riociguat (ADEMPAS) 2.5 MG TABS Take 2.5 mg by mouth 3 (three) times daily. 90 tablet 11  . SELEXIPAG PO Take 2,200 mcg by mouth in the morning and at bedtime. Patient takes 4 tablets twice daily total 2200 mcg    . torsemide (DEMADEX) 20 MG tablet Take 4 tablets (80 mg total) by mouth daily. 360 tablet 3  . Calcium Carb-Cholecalciferol (CALCIUM 600+D) 600-800 MG-UNIT TABS Take 600 mg by mouth daily.    . Melatonin 2.5 MG CAPS Take by mouth at bedtime.    . potassium chloride (KLOR-CON) 10 MEQ tablet Take 4 tablets (40 mEq total) by mouth daily. Take extra 64mq with metolazone on wednesdays 360 tablet 1  . Selexipag (UPTRAVI) 1000 MCG TABS Take 2,000 mcg by mouth 2 (two) times daily.     No facility-administered medications prior to visit.     Allergies:   Levothyroxine, Statins, Prednisone, and Penicillins   Social History   Socioeconomic History  . Marital status: Married    Spouse name: Not on file  . Number of children: 2  . Years of education: Not on file  . Highest education level: Not on file  Occupational History  . Not on file  Tobacco Use  . Smoking status: Never Smoker  . Smokeless tobacco: Never Used  Vaping Use  . Vaping Use: Never used  Substance and Sexual Activity  . Alcohol use: Yes    Alcohol/week: 2.0 standard drinks    Types: 2 Glasses of  wine per week    Comment: several glasses wine weekly  . Drug use: No  . Sexual activity: Yes  Other Topics Concern  . Not on file  Social History Narrative   She enjoys birding.  Lives at home with husband.   Married.   retired lQuarry manager pTax inspector  Social Determinants of HRadio broadcast assistantStrain: Not on fComcastInsecurity: Not on file  Transportation Needs: Not on file  Physical Activity: Not on file  Stress: Not on file  Social Connections: Not on file     Family History:  The patient's family history includes Other in an other family member. She was adopted.   ROS General: Negative; No fevers, chills, or night sweats;  HEENT: Negative; No changes in vision or hearing, sinus congestion, difficulty swallowing Pulmonary: Negative; No cough, wheezing, shortness of breath, hemoptysis Cardiovascular: Negative; No chest pain, presyncope,  syncope, palpitations GI: Negative; No nausea, vomiting, diarrhea, or abdominal pain GU: Negative; No dysuria, hematuria, or difficulty voiding Musculoskeletal: Negative; no myalgias, joint pain, or weakness Hematologic/Oncology: Negative; no easy bruising, bleeding Endocrine: Negative; no heat/cold intolerance; no diabetes Neuro: Negative; no changes in balance, headaches Skin: Negative; No rashes or skin lesions Psychiatric: Negative; No behavioral problems, depression Sleep: Positive for severe sleep apnea.  Since initiating CPAP therapy there is no residual snoring, daytime sleepiness, hypersomnolence, bruxism, restless legs, hypnogognic hallucinations, no cataplexy Other comprehensive 14 point system review is negative.   PHYSICAL EXAM:   VS:  BP 100/60 (BP Location: Left Arm, Patient Position: Sitting)   Pulse 64   Ht 5' (1.524 m)   Wt 140 lb 3.2 oz (63.6 kg)   SpO2 99%   BMI 27.38 kg/m    Wt Readings from Last 3 Encounters:  10/15/20 140 lb (63.5 kg)  10/14/20 140 lb 3.2 oz (63.6 kg)   09/03/20 130 lb (59 kg)      Physical Exam BP 100/60 (BP Location: Left Arm, Patient Position: Sitting)   Pulse 64   Ht 5' (1.524 m)   Wt 140 lb 3.2 oz (63.6 kg)   SpO2 99%   BMI 27.38 kg/m  General: Alert, oriented, no distress.  Skin: normal turgor, no rashes, warm and dry HEENT: Normocephalic, atraumatic. Pupils equal round and reactive to light; sclera anicteric; extraocular muscles intact; Fundi ** Nose without nasal septal hypertrophy Mouth/Parynx benign; Mallinpatti scale Neck: No JVD, no carotid bruits; normal carotid upstroke Lungs: clear to ausculatation and percussion; no wheezing or rales Chest wall: without tenderness to palpitation Heart: PMI not displaced, RRR, s1 s2 normal, 1/6 systolic murmur, no diastolic murmur, no rubs, gallops, thrills, or heaves Abdomen: soft, nontender; no hepatosplenomehaly, BS+; abdominal aorta nontender and not dilated by palpation. Back: no CVA tenderness Pulses 2+ Musculoskeletal: full range of motion, normal strength, no joint deformities Extremities: no clubbing cyanosis or edema, Homan's sign negative  Neurologic: grossly nonfocal; Cranial nerves grossly wnl Psychologic: Normal mood and affect    General: Alert, oriented, no distress.  Skin: normal turgor, no rashes, warm and dry HEENT: Normocephalic, atraumatic. Pupils equal round and reactive to light; sclera anicteric; extraocular muscles intact; Fundi Without hemorrhages or exudates. Nose without nasal septal hypertrophy Mouth/Parynx benign; Mallinpatti scale 3 Neck: No JVD, no carotid bruits; normal carotid upstroke Lungs: clear to ausculatation and percussion; no wheezing or rales Chest wall: without tenderness to palpitation Heart: PMI not displaced, RRR, s1 s2 normal, 1/6 systolic murmur, no diastolic murmur, no rubs, gallops, thrills, or heaves Abdomen: soft, nontender; no hepatosplenomehaly, BS+; abdominal aorta nontender and not dilated by palpation. Back: no CVA  tenderness Pulses 2+ Musculoskeletal: full range of motion, normal strength, no joint deformities Extremities: no clubbing cyanosis or edema, Homan's sign negative  Neurologic: grossly nonfocal; Cranial nerves grossly wnl Psychologic: Normal mood and affect   Studies/Labs Reviewed:   ECG (independently read by me): Normal sinus rhythm at 79 bpm with frequent PACs.  [place] complex V1 V2.  December 2018 ECG (independently read by me):  Sinus rhythm with PACs.  Poor progression V1 and V2.  Normal intervals.   An independent review of her last ECG from 08/08/2015 reveals normal sinus rhythm at 70 bpm.  There is left axis deviation and incomplete right bundle branch block.  Recent Labs: BMP Latest Ref Rng & Units 10/03/2020 09/19/2020 09/11/2020  Glucose 70 - 99 mg/dL - 105(H) 113(H)  BUN 4 - 21 40(A)  43(H) 50(H)  Creatinine 0.5 - 1.1 3.5(A) 4.36(H) 4.61(H)  BUN/Creat Ratio 12 - 28 - - -  Sodium 137 - 147 137 136 138  Potassium 3.4 - 5.3 4.1 4.4 4.5  Chloride 99 - 108 102 102 99  CO2 13 - 22 25(A) 24 26  Calcium 8.7 - 10.7 11.5(A) 11.1(H) 12.0(H)     Hepatic Function Latest Ref Rng & Units 10/03/2020 04/19/2019 09/01/2018  Total Protein 6.0 - 8.3 g/dL - 7.6 7.6  Albumin 3.5 - 5.0 3.9 4.3 4.0  AST 0 - 37 U/L - 19 15  ALT 0 - 35 U/L - 14 11  Alk Phosphatase 39 - 117 U/L - 54 55  Total Bilirubin 0.2 - 1.2 mg/dL - 0.8 0.5  Bilirubin, Direct 0.0 - 0.3 mg/dL - - -    CBC Latest Ref Rng & Units 10/03/2020 08/09/2020 08/09/2020  WBC 4.0 - 10.5 K/uL - - -  Hemoglobin 12.0 - 16.0 11.3(A) 10.2(L) 10.2(L)  Hematocrit 36.0 - 46.0 % - 30.0(L) 30.0(L)  Platelets 150 - 400 K/uL - - -   Lab Results  Component Value Date   MCV 82.5 08/06/2020   MCV 83.5 07/29/2020   MCV 86.2 04/17/2020   Lab Results  Component Value Date   TSH 4.82 (H) 09/17/2020   Lab Results  Component Value Date   HGBA1C 5.2 04/19/2019     BNP    Component Value Date/Time   BNP 268.4 (H) 11/07/2019 1146    ProBNP     Component Value Date/Time   PROBNP 108.0 (H) 09/01/2018 1100     Lipid Panel     Component Value Date/Time   CHOL 188 04/19/2019 1229   TRIG 163.0 (H) 04/19/2019 1229   TRIG 185 02/11/2009 0000   HDL 49.70 04/19/2019 1229   CHOLHDL 4 04/19/2019 1229   VLDL 32.6 04/19/2019 1229   LDLCALC 105 (H) 04/19/2019 1229     RADIOLOGY: CT ABDOMEN PELVIS WO CONTRAST  Result Date: 10/17/2020 CLINICAL DATA:  Mid abdominal pain, incisional hernia, multiple hernias, atrial fibrillation, cholecystectomy, hypertension, GERD, CHF EXAM: CT ABDOMEN AND PELVIS WITHOUT CONTRAST TECHNIQUE: Multidetector CT imaging of the abdomen and pelvis was performed following the standard protocol without IV contrast. Sagittal and coronal MPR images reconstructed from axial data set. No oral contrast administered. COMPARISON:  08/08/2015 FINDINGS: Lower chest: Peribronchial thickening and interseptal thickening in the lower lobes. Subsegmental atelectasis BILATERAL lower lobes. Calcified granuloma RIGHT lower lobe. Small RIGHT pleural effusion. Minimal patchy bibasilar infiltrate. Hepatobiliary: Gallbladder surgically absent.  Liver unremarkable. Pancreas: Low-attenuation region at pancreatic head suspicious for subtle pancreatic mass measuring 2.6 x 2.0 cm. Remainder of pancreas normal appearance. Spleen: Normal appearance Adrenals/Urinary Tract: Adrenal thickening without mass. Kidneys, ureters, and bladder normal appearance Stomach/Bowel: Scattered colonic diverticula. No evidence of diverticulitis. Stomach decompressed. Bowel loops otherwise normal appearance. Appendix not definitely visualized. Vascular/Lymphatic: Atherosclerotic calcifications aorta and iliac arteries without aneurysm. No adenopathy. Scattered pelvic phleboliths. Reproductive: Unremarkable uterus and adnexa Other: Umbilical hernia containing fat and minimal fluid. No bowel herniation. Several additional tiny supraumbilical ventral hernias are seen  containing fat and minimal fluid. No free air or ascites. Musculoskeletal: Osseous demineralization. Beam hardening artifacts in pelvis from RIGHT hip prosthesis. Mild degenerative disc and facet disease changes of lumbar spine. IMPRESSION: Umbilical hernia containing fat and minimal fluid. Several additional tiny supraumbilical ventral hernias containing fat and minimal fluid. Colonic diverticulosis without evidence of diverticulitis. Low-attenuation region at pancreatic head question subtle pancreatic mass 2.0 x  2.6 cm; follow-up non emergent MR imaging with and without contrast recommended to exclude neoplasm. Small RIGHT pleural effusion with bibasilar atelectasis and minimal patchy bibasilar infiltrates. Aortic Atherosclerosis (ICD10-I70.0). Electronically Signed   By: Lavonia Dana M.D.   On: 10/17/2020 14:42     Additional studies/ records that were reviewed today include:  I review the office records of Dr. [name], the patient's sleep study, ECG, and most recent download.    ASSESSMENT:    1. Paroxysmal atrial fibrillation (HCC)   2. OSA on CPAP   3. Pulmonary hypertension ([place])   4. CREST syndrome ([place])   5. 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 Tiffany [name] is a very pleasant 75 year old female who has a history of paroxysmal atrial fibrillation and diastolic heart failure and was found to have significant pulmonary hypertension.  I reviewed her sleep study in detail with her, which demonstrates severe sleep apnea and significant oxygen desaturation to a nadir of 80%.  On her initial study, she had reduced sleep efficiency and moderate snoring.  She has been on CPAP therapy at 13 cm and is 100% compliance with reference to usage stays as well as usage greater than 4 hours.  She is averaging 9 hours and 17 minutes of sleep per night.  She has noticed some dryness.  I have suggested she use nasal saline in her nares, which will also help her sinuses and use this prior to CPAP  use at night and also use in the morning.  We discussed alteration of her humidification.  She is meeting compliance standards.  Discussed the role of untreated sleep apnea in cardiovascular disease and its association with mild primary hypertension.  I do not feel to severe pulmonary hypertension is related primarily to her obstructive sleep apnea.  She has Raynaud's disease and does not have definitive crest syndrome.  Her blood pressure today in the office was excellent at 116/74.  She has a ResMed AirFit 10 small mask.  Per Medicare requirements, I will see her in one year for reevaluation.   Medication Adjustments/Labs and Tests Ordered: Current medicines are reviewed at length with the patient today.  Concerns regarding medicines are outlined above.  Medication changes, Labs and Tests ordered today are listed in the Patient Instructions below.  Patient Instructions  Medication Instructions:  NO CHANGES  *If you need a refill on your cardiac medications before your next appointment, please call your pharmacy*   Lab Work:  NOT NEEDED   Testing/Procedures:  NOT NEEDED  Follow-Up: At [facility], you and your health needs are our priority.  As part of our continuing mission to provide you with exceptional heart care, we have created designated Provider Care Teams.  These Care Teams include your primary Cardiologist (physician) and Advanced Practice Providers (APPs -  Physician Assistants and Nurse Practitioners) who all work together to provide you with the care you need, when you need it.     Your next appointment:   12 month(s) SLEEP CLINIC  The format for your next appointment:   In Person  Provider:   Shelva Majestic, MD      Time spent: 30 minutes Signed, Shelva Majestic, MD  10/20/2020 4:37 PM    [facility] [address] [place], [place], [place]  [address] Phone: [phone]

## 2020-10-14 NOTE — Patient Instructions (Addendum)
Medication Instructions:  NO CHANGES  *If you need a refill on your cardiac medications before your next appointment, please call your pharmacy*   Lab Work:  NOT NEEDED   Testing/Procedures:  NOT NEEDED  Follow-Up: At [facility], you and your health needs are our priority.  As part of our continuing mission to provide you with exceptional heart care, we have created designated Provider Care Teams.  These Care Teams include your primary Cardiologist (physician) and Advanced Practice Providers (APPs -  Physician Assistants and Nurse Practitioners) who all work together to provide you with the care you need, when you need it.     Your next appointment:   12 month(s) SLEEP CLINIC  The format for your next appointment:   In Person  Provider:   Shelva Majestic, MD

## 2020-10-15 ENCOUNTER — Ambulatory Visit: Payer: Medicare PPO | Admitting: Vascular Surgery

## 2020-10-15 ENCOUNTER — Encounter: Payer: Self-pay | Admitting: Vascular Surgery

## 2020-10-15 DIAGNOSIS — N184 Chronic kidney disease, stage 4 (severe): Secondary | ICD-10-CM | POA: Insufficient documentation

## 2020-10-15 NOTE — Progress Notes (Signed)
Patient name: Tiffany [name] MRN: [id number] DOB: [birth year] Sex: female  REASON FOR CONSULT: Evaluate for PD catheter  HPI: Tiffany [name] is a 75 y.o. 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 fib status post ablation, stage IV CKD, and CREST syndrome that presents for evaluation of PD dialysis catheter.  Patient states that in discussing with her nephrologist Dr. Joelyn Oms her understanding is that peritoneal dialysis would be in her best interest vs hemodialysis.  She denies any previous access placement including AV fistulas, AV graft or previous PD catheters.  She has had a upper midline laparotomy for previous open gallbladder surgery about 20 years ago and has a fairly large umbilical hernia.  She states she has an appointment next week to see Dr. Bobbye Morton with [facility] surgery to have the umbilical hernia evaluated.  She does live independently.    Past Medical History:  Diagnosis Date  . Arthritis 04-20-12   Osteoarthritis-right hip  . Atrial fibrillation status post cardioversion, 01/30/15 maintaining SR.  01/31/2015   a. 01/2015 s/p TEE/DCCV;  b. 02/06/2015 recurrent AF noted, amio added;  c. CHA2DS2VASc = 3-->eliquis.  Marland Kitchen BENIGN POSITIONAL VERTIGO   . Cataract   . CHF (congestive heart failure) ([place])   . Complication of anesthesia 04-20-12   some issues with prolonged sedation after anesthesia  . Diverticulosis   . DYSLIPIDEMIA   . Esophageal stricture   . GERD   . HYPERTENSION    a. severe, pt intol of med tx, Pt. has severe "whitecoat" syndrome and refused medical therapy.  . Hypothyroidism 09/29/2014   a. pt did not tolerate synthroid and this was subsequently discontinued.  . Mild LV dysfunction    a. 01/2015 Echo: EF 45-50%, mild MR, mildly dil LA, mod dil RV with mod to sev reduced fxn, mod-sev TR, PASP 2mHg.  . Osteopenia   . Raynaud disease   . URINARY INCONTINENCE 04-20-12    occ. with nighttime sleep pattern    Past Surgical History:  Procedure Laterality Date  . ATRIAL FIBRILLATION ABLATION N/A 01/26/2017   Procedure: Atrial Fibrillation Ablation;  Surgeon: AThompson Grayer MD;  [facility];  Service: Cardiovascular;  Laterality: N/A;  . breast ultrasound Right 09/13/13   There is no sonographic evidence of malignancy. the [id number] cyst in (R) breast is consistent with a benign finding. repeat in 1 year  . CARDIAC CATHETERIZATION N/A 08/29/2015   Procedure: Right Heart Cath;  Surgeon: DLarey Dresser MD;  [place] [facility];  Service: Cardiovascular;  Laterality: N/A;  . CARDIOVERSION N/A 01/30/2015   Procedure: CARDIOVERSION;  Surgeon: MSanda Klein MD;  [facility]: [facility];  Service: Cardiovascular;  Laterality: N/A;  . CHOLECYSTECTOMY     '90-"sludge"  . NASAL FRACTURE SURGERY  2006  . RIGHT HEART CATH N/A 06/30/2017   Procedure: RIGHT HEART CATH;  Surgeon: MLarey Dresser MD;  [facility];  Service: Cardiovascular;  Laterality: N/A;  . RIGHT HEART CATH N/A 08/09/2020   Procedure: RIGHT HEART CATH;  Surgeon: MLarey Dresser MD;  [place] [facility];  Service: Cardiovascular;  Laterality: N/A;  . TEE WITHOUT CARDIOVERSION N/A 01/30/2015   Procedure: TRANSESOPHAGEAL ECHOCARDIOGRAM (TEE);  Surgeon: MSanda Klein MD;  [facility];  Service: Cardiovascular;  Laterality: N/A;  . TEE WITHOUT CARDIOVERSION N/A 01/25/2017   Procedure: TRANSESOPHAGEAL ECHOCARDIOGRAM (TEE);  Surgeon: RFay Records MD;  [facility];  Service: Cardiovascular;  Laterality: N/A;  . TOTAL HIP ARTHROPLASTY  04/26/2012   Procedure: TOTAL HIP ARTHROPLASTY ANTERIOR APPROACH;  Surgeon: Mauri Pole, MD;  Location: [facility];  Service: Orthopedics;  Laterality: Right;  . TUBAL LIGATION  1980    Family History  Adopted: Yes  Problem Relation Age of Onset  . Other Other        patient was adopted     SOCIAL HISTORY: Social History   Socioeconomic History  . Marital status: Married    Spouse name: Not on file  . Number of children: 2  . Years of education: Not on file  . Highest education level: Not on file  Occupational History  . Not on file  Tobacco Use  . Smoking status: Never Smoker  . Smokeless tobacco: Never Used  Vaping Use  . Vaping Use: Never used  Substance and Sexual Activity  . Alcohol use: Yes    Alcohol/week: 2.0 standard drinks    Types: 2 Glasses of wine per week    Comment: several glasses wine weekly  . Drug use: No  . Sexual activity: Yes  Other Topics Concern  . Not on file  Social History Narrative   She enjoys birding.  Lives at home with husband.   Married.   retired Quarry manager, Tax inspector   Social Determinants of Radio broadcast assistant Strain: Not on [employer] Insecurity: Not on file  Transportation Needs: Not on file  Physical Activity: Not on file  Stress: Not on file  Social Connections: Not on file  Intimate Partner Violence: Not on file    Allergies  Allergen Reactions  . Levothyroxine Shortness Of Breath and Other (See Comments)    Exhaustion also - Per pt her this medication could have not been the reason for her symptoms  . Statins Other (See Comments)    "Pain, weakness and kidney problems"  . Prednisone Other (See Comments)    Severe insomnia  . Penicillins Other (See Comments)    dry mouth Has patient had a PCN reaction causing immediate rash, facial/tongue/throat swelling, SOB or lightheadedness with hypotension: No Has patient had a PCN reaction causing severe rash involving mucus membranes or skin necrosis: No Has patient had a PCN reaction that required hospitalization No Has patient had a PCN reaction occurring within the last 10 years: No If all of the above answers are "NO", then may proceed with Cephalosporin use.     Current Outpatient Medications  Medication Sig Dispense  Refill  . acetaminophen (TYLENOL) 325 MG tablet Take 2 tablets (650 mg total) by mouth every 4 (four) hours as needed for headache or mild pain.    Marland Kitchen ELIQUIS 5 MG TABS tablet TAKE 1 TABLET BY MOUTH TWICE A DAY 180 tablet 1  . famotidine (PEPCID) 20 MG tablet TAKE 1 TABLET BY MOUTH TWICE A DAY 180 tablet 3  . flecainide (TAMBOCOR) 50 MG tablet TAKE 1 TABLET BY MOUTH TWICE A DAY 180 tablet 2  . fluticasone (FLONASE) 50 MCG/ACT nasal spray Place 1 spray daily as needed into both nostrils for allergies or rhinitis.    . macitentan (OPSUMIT) 10 MG tablet Take 1 tablet (10 mg total) by mouth daily. 30 tablet 11  . Melatonin 2.5 MG CHEW Chew 2.5 mg by mouth as needed.    . metoprolol succinate (TOPROL-XL) 25 MG 24 hr tablet TAKE 1 TABLET BY MOUTH EVERY DAY 90 tablet 3  .  potassium chloride (KLOR-CON) 10 MEQ tablet Take 10 mEq by mouth 2 (two) times daily. Patient takes 2 tablets twice daily    . Probiotic Product (PROBIOTIC-10) CAPS Take 1 capsule by mouth 3 (three) times a week.     . Riociguat (ADEMPAS) 2.5 MG TABS Take 2.5 mg by mouth 3 (three) times daily. 90 tablet 11  . SELEXIPAG PO Take 2,200 mcg by mouth in the morning and at bedtime. Patient takes 4 tablets twice daily total 2200 mcg    . torsemide (DEMADEX) 20 MG tablet Take 4 tablets (80 mg total) by mouth daily. 360 tablet 3   No current facility-administered medications for this visit.    REVIEW OF SYSTEMS:  '[X]'$  denotes positive finding, '[ ]'$  denotes negative finding Cardiac  Comments:  Chest pain or chest pressure:    Shortness of breath upon exertion:    Short of breath when lying flat:    Irregular heart rhythm:        Vascular    Pain in calf, thigh, or hip brought on by ambulation:    Pain in feet at night that wakes you up from your sleep:     Blood clot in your veins:    Leg swelling:         Pulmonary    Oxygen at home:    Productive cough:     Wheezing:         Neurologic    Sudden weakness in arms or legs:      Sudden numbness in arms or legs:     Sudden onset of difficulty speaking or slurred speech:    Temporary loss of vision in one eye:     Problems with dizziness:         Gastrointestinal    Blood in stool:     Vomited blood:         Genitourinary    Burning when urinating:     Blood in urine:        Psychiatric    Major depression:         Hematologic    Bleeding problems:    Problems with blood clotting too easily:        Skin    Rashes or ulcers:        Constitutional    Fever or chills:      PHYSICAL EXAM: Vitals:   10/15/20 1048  BP: 100/62  Pulse: 68  Resp: 18  Temp: 98.2 F (36.8 C)  TempSrc: Temporal  Weight: 140 lb (63.5 kg)  Height: 5' (1.524 m)    GENERAL: The patient is a well-nourished female, in no acute distress. The vital signs are documented above. CARDIAC: There is a regular rate and rhythm.  VASCULAR:  Palpable radial pulses bilateral upper extremities Femoral pulses palpable both groins PULMONARY: No respiratory distress. ABDOMEN: Soft and non-tender.  Upper midline laparotomy incision with umbilical hernia with fascial defect about 2 fingers. MUSCULOSKELETAL: There are no major deformities or cyanosis. NEUROLOGIC: No focal weakness or paresthesias are detected. SKIN: There are no ulcers or rashes noted. PSYCHIATRIC: The patient has a normal affect.      DATA:   None  Assessment/Plan:  75 year old female with Stage IV CKD who presents for evaluation of PD dialysis catheter placement.  I had a long discussion with the patient about PD dialysis catheter placement and the typical steps involved as well as dialyzing with a PD catheter.  I did show her pictures of  what the catheter looks like.  My biggest concern is that she has a fairly sizable umbilical hernia as pictured above and has an appointment with Dr. Bobbye Morton next week to get this evaluated.  I think we can hopefully try and repair her umbilical hernia at the same time as placing a  PD catheter and I have reached out to Dr. Louanna Raw with [facility] given he has offered to be available to help [place] with these catheters particularly if extensive previous abdominal surgery.  We will make the referral over to his office and I did discuss with him on the phone today.  Hopefully we can get her scheduled if he feels she is a reasonable candidate to have the umbilical hernia repaired and PD catheter placed at the same time.  I discussed risks and benefits of PD catheter placement and discussed the high risk of infection in the long-term ultimately requiring the catheter to be removed.  She has also asked that I send a note to Dr. [name] and Dr. Joelyn Oms to confirm they agree PD dialysis would be better for her than HD.   [name], MD [facility] [place] Office: [phone]

## 2020-10-17 ENCOUNTER — Other Ambulatory Visit: Payer: Self-pay

## 2020-10-17 ENCOUNTER — Ambulatory Visit (HOSPITAL_COMMUNITY)
Admission: RE | Admit: 2020-10-17 | Discharge: 2020-10-17 | Disposition: A | Discharge status: Home / Self Care | Payer: Medicare PPO | Source: Ambulatory Visit | Attending: Surgery | Admitting: Surgery

## 2020-10-17 ENCOUNTER — Other Ambulatory Visit (HOSPITAL_COMMUNITY): Payer: Self-pay | Admitting: Surgery

## 2020-10-17 ENCOUNTER — Other Ambulatory Visit: Payer: Self-pay | Admitting: Surgery

## 2020-10-17 ENCOUNTER — Telehealth: Payer: Self-pay

## 2020-10-17 DIAGNOSIS — K439 Ventral hernia without obstruction or gangrene: Secondary | ICD-10-CM | POA: Diagnosis not present

## 2020-10-17 DIAGNOSIS — K432 Incisional hernia without obstruction or gangrene: Secondary | ICD-10-CM

## 2020-10-17 DIAGNOSIS — I4891 Unspecified atrial fibrillation: Secondary | ICD-10-CM | POA: Diagnosis not present

## 2020-10-17 DIAGNOSIS — K429 Umbilical hernia without obstruction or gangrene: Secondary | ICD-10-CM | POA: Diagnosis not present

## 2020-10-17 DIAGNOSIS — I10 Essential (primary) hypertension: Secondary | ICD-10-CM | POA: Diagnosis not present

## 2020-10-17 DIAGNOSIS — N186 End stage renal disease: Secondary | ICD-10-CM | POA: Diagnosis not present

## 2020-10-17 NOTE — Telephone Encounter (Signed)
   [facility] Pre-operative Risk Assessment    HEARTCARE STAFF: - Please ensure there is not already an duplicate clearance open for this procedure. - Under Visit Info/Reason for Call, type in Other and utilize the format Clearance MM/DD/YY or Clearance TBD. Do not use dashes or single digits. - If request is for dental extraction, please clarify the # of teeth to be extracted.  Request for surgical clearance:  1. What type of surgery is being performed? Hernia Repair with PD Cath placement   2. When is this surgery scheduled? URGENT TBD    3. What type of clearance is required (medical clearance vs. Pharmacy clearance to hold med vs. Both)? Both  4. Are there any medications that need to be held prior to surgery and how long? Eliquis, 2 days prior   5. Practice name and name of physician performing surgery? [facility], Dr. Thermon Leyland   6. What is the office phone number? [phone]   7.   What is the office fax number? [phone] Attn: Andreas Blower, CMA  8.   Anesthesia type (None, local, MAC, general) ? General    Jacqulynn Cadet 10/17/2020, 12:47 PM  _________________________________________________________________   (provider comments below)

## 2020-10-17 NOTE — Telephone Encounter (Signed)
   Primary Cardiologist: Loralie Champagne, MD  Chart reviewed as part of pre-operative protocol coverage. Patient was contacted 10/17/2020 in reference to pre-operative risk assessment for pending surgery as outlined below.  Tiffany [name] was last seen on 09/03/20 by Allena Katz, NP.  Since that day, Tiffany [name] has done okay from a cardiac standpoint. She was initially planning to pursue hernia repair with PD catheter placement, however underwent a CT A/P today with more extensive herniation than anticipated and likely is not a candidate for PD anymore. She would like to discuss her options with Dr. [name] in person to determine what the cardiovascular risks would be if pursuing HD.   I will route this message to Kevan Rosebush to assist with follow-up.   Pre-op covering staff: - Please contact requesting surgeon's office via preferred method (i.e, phone, fax) to inform them of need for appointment prior to surgery.  Pharmacy has addressed recommendations for holding eliquis as detailed below.  Abigail Butts, PA-C  10/17/2020, 4:54 PM

## 2020-10-17 NOTE — Telephone Encounter (Signed)
Patient with diagnosis of afib on Eliquis for anticoagulation.    Procedure: Hernia Repair with PD Cath placement   Date of procedure: urgent TBD  CHA2DS2-VASc Score = 6  This indicates a 9.7% annual risk of stroke. The patient's score is based upon: CHF History: Yes HTN History: Yes Diabetes History: No Stroke History: No Vascular Disease History: Yes (atherosclerotic calcifications noted on 10/17/20 abdominal CT) Age Score: 2 Gender Score: 1  CrCl 71m/min Platelet count 184K  Per office protocol, patient can hold Eliquis for 2 days prior to procedure.

## 2020-10-18 NOTE — Telephone Encounter (Signed)
Pt has appt with Dr. [name] 11/14/20. Will forward notes to MD for appt.

## 2020-10-18 NOTE — Telephone Encounter (Signed)
See note, can she get in sooner appointment to discuss options for dialysis access surgery? Thanks.

## 2020-10-20 ENCOUNTER — Encounter: Payer: Self-pay | Admitting: Cardiovascular Disease

## 2020-10-22 ENCOUNTER — Other Ambulatory Visit (INDEPENDENT_AMBULATORY_CARE_PROVIDER_SITE_OTHER): Payer: Medicare PPO

## 2020-10-22 ENCOUNTER — Other Ambulatory Visit: Payer: Self-pay

## 2020-10-22 DIAGNOSIS — E0500 Thyrotoxicosis with diffuse goiter without thyrotoxic crisis or storm: Secondary | ICD-10-CM | POA: Diagnosis not present

## 2020-10-22 LAB — TSH: TSH: 2.94 u[IU]/mL (ref 0.35–4.50)

## 2020-10-22 LAB — T3, FREE: T3, Free: 2.8 pg/mL (ref 2.3–4.2)

## 2020-10-22 LAB — T4, FREE: Free T4: 1.31 ng/dL (ref 0.60–1.60)

## 2020-10-23 ENCOUNTER — Telehealth: Payer: Self-pay | Admitting: Vascular Surgery

## 2020-10-23 NOTE — Telephone Encounter (Signed)
75 year old female that I saw last week for evaluation of PD catheter placement.  She had a fairly large umbilical hernia and I sent her to see Dr. Louanna Raw with [facility] to evaluate umbilical hernia repair and PD catheter placement at the same time.  Ultimately he got CT scan and this showed she has additional ventral hernias in addition to umbilical hernia.  He discussed with Dr. Joelyn Oms her nephrologist and the plan would likely be to initiate HD when she needs it and delay PD catheter and hernia repair at this time.  Ultimately if she wants to switch over to PD catheter in the future would require full hernia repair and PD catheter placement at a later time when she is optimized.  She has an appointment with her cardiologist to discuss risk and benefits of HD.  I updated her husband plan of care and they are on the same page at this time and understand the next steps to delay PD catheter at this time.  [name], MD [facility] [place] Office: [place]

## 2020-10-29 ENCOUNTER — Encounter (HOSPITAL_COMMUNITY): Payer: Medicare PPO | Admitting: Cardiology

## 2020-10-29 DIAGNOSIS — N184 Chronic kidney disease, stage 4 (severe): Secondary | ICD-10-CM | POA: Diagnosis not present

## 2020-10-29 LAB — BASIC METABOLIC PANEL
BUN: 32 — AB (ref 4–21)
CO2: 20 (ref 13–22)
Chloride: 103 (ref 99–108)
Creatinine: 2.9 — AB (ref 0.5–1.1)
Glucose: 91
Potassium: 4.4 (ref 3.4–5.3)
Sodium: 141 (ref 137–147)

## 2020-10-29 LAB — IRON,TIBC AND FERRITIN PANEL
%SAT: 16
Ferritin: 124
Iron: 41
TIBC: 254
UIBC: 213

## 2020-10-29 LAB — CBC AND DIFFERENTIAL: Hemoglobin: 12 (ref 12.0–16.0)

## 2020-10-29 LAB — COMPREHENSIVE METABOLIC PANEL
Albumin: 3.9 (ref 3.5–5.0)
Calcium: 9 (ref 8.7–10.7)

## 2020-10-30 ENCOUNTER — Ambulatory Visit: Payer: Medicare PPO | Admitting: Internal Medicine

## 2020-10-30 ENCOUNTER — Encounter: Payer: Self-pay | Admitting: Internal Medicine

## 2020-10-30 VITALS — BP 108/58 | HR 68 | Ht 60.0 in | Wt 136.0 lb

## 2020-10-30 DIAGNOSIS — R935 Abnormal findings on diagnostic imaging of other abdominal regions, including retroperitoneum: Secondary | ICD-10-CM

## 2020-10-30 DIAGNOSIS — R109 Unspecified abdominal pain: Secondary | ICD-10-CM | POA: Diagnosis not present

## 2020-10-30 DIAGNOSIS — K869 Disease of pancreas, unspecified: Secondary | ICD-10-CM | POA: Diagnosis not present

## 2020-10-30 DIAGNOSIS — K469 Unspecified abdominal hernia without obstruction or gangrene: Secondary | ICD-10-CM | POA: Diagnosis not present

## 2020-10-30 DIAGNOSIS — K862 Cyst of pancreas: Secondary | ICD-10-CM

## 2020-10-30 NOTE — Patient Instructions (Signed)
Take 1 dose of Miralax daily - adjust to achieve one to two bowel movements a day.  Abdominal corset as discussed with Dr. Henrene Pastor.  Please follow up on 01/07/2021 at 2:40pm.  You will be contacted by [facility] in the next 2 days to arrange an MRI.  The number on your caller ID will be [phone], please answer when they call.  If you have not heard from them in 2 days please call [phone] to schedule.

## 2020-10-30 NOTE — Progress Notes (Signed)
HISTORY OF PRESENT ILLNESS:  Tiffany [name] is a 75 y.o. female with multiple significant medical problems including history of atrial fibrillation for which she is on Eliquis, congestive heart failure, hypertension, CREST syndrome, progressive renal failure and GERD complicated by peptic stricture.  She presents today for evaluation of problems with recurrent abdominal pain.  She is accompanied by her husband [name].  I last saw the patient in the office October 2015 after she underwent both surveillance colonoscopy and upper endoscopy in August 2015.  Colonoscopy revealed moderate diverticulosis but was otherwise normal.  Upper endoscopy revealed esophagitis and peptic stricture without Barrett's for which a 61 French Maloney dilator was used for esophageal dilation.  She has not been seen since.  Because of her progressive renal failure she is contemplating dialysis.  She has been seen by general surgery for possible peritoneal dialysis.  May not be a candidate due to a history of small bowel obstruction, prior surgery, adhesions, and abdominal wall hernias.  She is also seeing nephrology.  Patient tells me that she will develop lower bandlike abdominal pain may occur several times per week.  It is unpredictable.  Generally associated with abdominal discomfort throughout the day.  There have been times if this is associated with vomiting.  Vomiting and large bowel movements tend to improve the discomfort.  She has had no vomiting for about 6 months.  These problems have been going on for about 2-1/2 years.  Symptoms typically occur during the day when she is up and about.  She has noticed that her umbilical hernia has progressed in size and at times becomes more firm or hard when she has episodes of abdominal pain.  She denies rectal bleeding.  Review of outside laboratories from October 03, 2020 shows BUN of 40 and creatinine 3.5.  Her last TSH was 2.94.  Review of CT scan obtained October 17, 2020 (noncontrast  enhanced of the abdomen and pelvis) reveals umbilical hernia containing fat minimal fluid.  Several additional smaller supraumbilical ventral hernias also containing fat and minimum fluid.  Diverticulosis without diverticulitis.  Finally, low attenuation region at the pancreatic head raising the question of subtle pancreatic mass measuring 2 x 2.6 cm.  Follow-up contrast-enhanced MRI recommended.  She has completed her COVID vaccination series  REVIEW OF SYSTEMS:  All non-GI ROS negative unless otherwise stated in the HPI except for shortness of breath, ankle edema, pruritus  Past Medical History:  Diagnosis Date  . Arthritis 04-20-12   Osteoarthritis-right hip  . Atrial fibrillation status post cardioversion, 01/30/15 maintaining SR.  01/31/2015   a. 01/2015 s/p TEE/DCCV;  b. 02/06/2015 recurrent AF noted, amio added;  c. CHA2DS2VASc = 3-->eliquis.  Marland Kitchen BENIGN POSITIONAL VERTIGO   . Cataract   . CHF (congestive heart failure) ([place])   . Complication of anesthesia 04-20-12   some issues with prolonged sedation after anesthesia  . Diverticulosis   . DYSLIPIDEMIA   . Esophageal stricture   . GERD   . HYPERTENSION    a. severe, pt intol of med tx, Pt. has severe "whitecoat" syndrome and refused medical therapy.  . Hypothyroidism 09/29/2014   a. pt did not tolerate synthroid and this was subsequently discontinued.  . Mild LV dysfunction    a. 01/2015 Echo: EF 45-50%, mild MR, mildly dil LA, mod dil RV with mod to sev reduced fxn, mod-sev TR, PASP 40mHg.  . Osteopenia   . Raynaud disease   . URINARY INCONTINENCE 04-20-12   occ. with nighttime sleep  pattern    Past Surgical History:  Procedure Laterality Date  . ATRIAL FIBRILLATION ABLATION N/A 01/26/2017   Procedure: Atrial Fibrillation Ablation;  Surgeon: Thompson Grayer, MD;  Location: [place] [facility];  Service: Cardiovascular;  Laterality: N/A;  . breast ultrasound Right 09/13/13   There is no sonographic evidence of malignancy. the [id number]  complicated cyst in (R) breast is consistent with a benign finding. repeat in 1 year  . CARDIAC CATHETERIZATION N/A 08/29/2015   Procedure: Right Heart Cath;  Surgeon: Larey Dresser, MD;  Location: [place] [facility];  Service: Cardiovascular;  Laterality: N/A;  . CARDIOVERSION N/A 01/30/2015   Procedure: CARDIOVERSION;  Surgeon: Sanda Klein, MD;  Location: [facility];  Service: Cardiovascular;  Laterality: N/A;  . CATARACT EXTRACTION, BILATERAL    . CHOLECYSTECTOMY     '90-"sludge"  . NASAL FRACTURE SURGERY  2006  . RIGHT HEART CATH N/A 06/30/2017   Procedure: RIGHT HEART CATH;  Surgeon: Larey Dresser, MD;  Location: [place] [facility];  Service: Cardiovascular;  Laterality: N/A;  . RIGHT HEART CATH N/A 08/09/2020   Procedure: RIGHT HEART CATH;  Surgeon: Larey Dresser, MD;  Location: [place] [facility];  Service: Cardiovascular;  Laterality: N/A;  . TEE WITHOUT CARDIOVERSION N/A 01/30/2015   Procedure: TRANSESOPHAGEAL ECHOCARDIOGRAM (TEE);  Surgeon: Sanda Klein, MD;  Location: [facility] ENDOSCOPY;  Service: Cardiovascular;  Laterality: N/A;  . TEE WITHOUT CARDIOVERSION N/A 01/25/2017   Procedure: TRANSESOPHAGEAL ECHOCARDIOGRAM (TEE);  Surgeon: Fay Records, MD;  Location: [facility] ENDOSCOPY;  Service: Cardiovascular;  Laterality: N/A;  . TOTAL HIP ARTHROPLASTY  04/26/2012   Procedure: TOTAL HIP ARTHROPLASTY ANTERIOR APPROACH;  Surgeon: Mauri Pole, MD;  Location: [facility];  Service: Orthopedics;  Laterality: Right;  . [place]  reports that she has never smoked. She has never used smokeless tobacco. She reports current alcohol use. She reports that she does not use drugs.  family history includes Other in an other family member. She was adopted.  Allergies  Allergen Reactions  . Levothyroxine Shortness Of Breath and Other (See Comments)    Exhaustion also - Per pt her this medication could have not been the reason for her symptoms  . Statins  Other (See Comments)    "Pain, weakness and kidney problems"  . Prednisone Other (See Comments)    Severe insomnia  . Penicillins Other (See Comments)    dry mouth Has patient had a PCN reaction causing immediate rash, facial/tongue/throat swelling, SOB or lightheadedness with hypotension: No Has patient had a PCN reaction causing severe rash involving mucus membranes or skin necrosis: No Has patient had a PCN reaction that required hospitalization No Has patient had a PCN reaction occurring within the last 10 years: No If all of the above answers are "NO", then may proceed with Cephalosporin use.        PHYSICAL EXAMINATION: Vital signs: BP (!) 108/58   Pulse 68   Ht 5' (1.524 m)   Wt 136 lb (61.7 kg)   BMI 26.56 kg/m   Constitutional: Pleasant, well put together, chronically ill-appearing but in no acute distress Psychiatric: alert and oriented x3, cooperative Eyes: extraocular movements intact, anicteric, conjunctiva pink Mouth: oral pharynx moist, no lesions Neck: supple no lymphadenopathy Cardiovascular: heart regular rate and rhythm, no murmur Lungs: clear to auscultation bilaterally Abdomen: soft, nontender, nondistended, no obvious ascites, no peritoneal signs, normal bowel sounds, no organomegaly.  There is a  midline abdominal wall hernia above the umbilicus.  There is a larger umbilical hernia with spontaneous protrusion of contents which manually reduces with ease Rectal: Omitted Extremities: no clubbing or cyanosis.  2+ lower extremity edema bilaterally Skin: no clinically relevant lesions on visible extremities Neuro: No focal deficits.  Cranial nerves intact  ASSESSMENT:  1.  Episodic abdominal pain occasionally associated with vomiting as described.  I suspect she is experiencing intermittent partial small bowel obstruction which may or may not be related to her known hernias.  I suspect that may be related. 2.  History of small bowel obstruction 3.   Indeterminate lesion of the pancreas on noncontrast enhanced CT scan 4.  Significant abdominal wall hernias. 5.  History of GERD complicated by peptic stricture.  Currently asymptomatic post dilation on famotidine 6.  Multiple medical problems including atrial fibrillation on Eliquis, congestive heart failure, progressive renal failure, and CREST syndrome   PLAN:  1.  Recommended that she wear abdominal corset to reduce spontaneous herniation, which she finds uncomfortable. 2.  MiraLAX daily.  Titrate to have 1 or 2 bowel movements. 3.  MRI of the pancreas with and without contrast to assess for true pancreatic lesion 4.  Routine GI follow-up 2 months 5.  Ongoing follow-up with PCP, general surgery, and nephrology A total time of 60 minutes was spent preparing to see the patient, reviewing outside laboratories, x-rays, and records.  Performing comprehensive physical examination and obtaining a comprehensive history.  Counseling the patient and her husband regarding her above listed issues.  Ordering medications and advanced radiologic studies.  Arranging clinical follow-up.  And documenting clinical information in the health record.

## 2020-10-31 ENCOUNTER — Encounter: Payer: Self-pay | Admitting: Internal Medicine

## 2020-11-01 NOTE — Telephone Encounter (Signed)
Patient was scheduled for a sooner appointment.

## 2020-11-05 DIAGNOSIS — K429 Umbilical hernia without obstruction or gangrene: Secondary | ICD-10-CM | POA: Diagnosis not present

## 2020-11-05 DIAGNOSIS — I270 Primary pulmonary hypertension: Secondary | ICD-10-CM | POA: Diagnosis not present

## 2020-11-05 DIAGNOSIS — N184 Chronic kidney disease, stage 4 (severe): Secondary | ICD-10-CM | POA: Diagnosis not present

## 2020-11-05 DIAGNOSIS — D631 Anemia in chronic kidney disease: Secondary | ICD-10-CM | POA: Diagnosis not present

## 2020-11-05 DIAGNOSIS — M341 CR(E)ST syndrome: Secondary | ICD-10-CM | POA: Diagnosis not present

## 2020-11-05 DIAGNOSIS — I5030 Unspecified diastolic (congestive) heart failure: Secondary | ICD-10-CM | POA: Diagnosis not present

## 2020-11-08 ENCOUNTER — Encounter (HOSPITAL_COMMUNITY): Payer: Self-pay | Admitting: Cardiology

## 2020-11-08 ENCOUNTER — Other Ambulatory Visit: Payer: Self-pay

## 2020-11-08 ENCOUNTER — Ambulatory Visit (HOSPITAL_COMMUNITY)
Admission: RE | Admit: 2020-11-08 | Discharge: 2020-11-08 | Disposition: A | Discharge status: Home / Self Care | Payer: Medicare PPO | Source: Ambulatory Visit | Attending: Cardiology | Admitting: Cardiology

## 2020-11-08 VITALS — BP 104/60 | HR 65 | Wt 138.2 lb

## 2020-11-08 DIAGNOSIS — Z7901 Long term (current) use of anticoagulants: Secondary | ICD-10-CM | POA: Diagnosis not present

## 2020-11-08 DIAGNOSIS — K439 Ventral hernia without obstruction or gangrene: Secondary | ICD-10-CM | POA: Insufficient documentation

## 2020-11-08 DIAGNOSIS — G4730 Sleep apnea, unspecified: Secondary | ICD-10-CM | POA: Insufficient documentation

## 2020-11-08 DIAGNOSIS — R0602 Shortness of breath: Secondary | ICD-10-CM | POA: Insufficient documentation

## 2020-11-08 DIAGNOSIS — Z79899 Other long term (current) drug therapy: Secondary | ICD-10-CM | POA: Insufficient documentation

## 2020-11-08 DIAGNOSIS — M341 CR(E)ST syndrome: Secondary | ICD-10-CM | POA: Insufficient documentation

## 2020-11-08 DIAGNOSIS — I272 Pulmonary hypertension, unspecified: Secondary | ICD-10-CM | POA: Diagnosis not present

## 2020-11-08 DIAGNOSIS — I130 Hypertensive heart and chronic kidney disease with heart failure and stage 1 through stage 4 chronic kidney disease, or unspecified chronic kidney disease: Secondary | ICD-10-CM | POA: Insufficient documentation

## 2020-11-08 DIAGNOSIS — I2721 Secondary pulmonary arterial hypertension: Secondary | ICD-10-CM | POA: Diagnosis not present

## 2020-11-08 DIAGNOSIS — I480 Paroxysmal atrial fibrillation: Secondary | ICD-10-CM | POA: Diagnosis not present

## 2020-11-08 DIAGNOSIS — N184 Chronic kidney disease, stage 4 (severe): Secondary | ICD-10-CM | POA: Insufficient documentation

## 2020-11-08 DIAGNOSIS — I5032 Chronic diastolic (congestive) heart failure: Secondary | ICD-10-CM | POA: Insufficient documentation

## 2020-11-08 DIAGNOSIS — R600 Localized edema: Secondary | ICD-10-CM | POA: Diagnosis not present

## 2020-11-08 MED ORDER — TORSEMIDE 20 MG PO TABS: ORAL_TABLET | ORAL | 3 refills | Status: DC

## 2020-11-08 NOTE — Patient Instructions (Addendum)
EKG done today.  No Labs done today.   INCREASE Torsemide to '80mg'$  by mouth every morning and '40mg'$  every evening.  No other medication changes were made. Please continue all current medications as prescribed.  Your physician recommends that you schedule a follow-up appointment in: 10 days  If you have any questions or concerns before your next appointment please send [place] a message through [place] or call our office at [phone].    TO LEAVE A MESSAGE FOR THE NURSE SELECT OPTION 2, PLEASE LEAVE A MESSAGE INCLUDING: . YOUR NAME . DATE OF BIRTH . CALL BACK NUMBER . REASON FOR CALL**this is important as we prioritize the call backs  YOU WILL RECEIVE A CALL BACK THE SAME DAY AS LONG AS YOU CALL BEFORE 4:00 PM   Do the following things EVERYDAY: 1) Weigh yourself in the morning before breakfast. Write it down and keep it in a log. 2) Take your medicines as prescribed 3) Eat low salt foods--Limit salt (sodium) to 2000 mg per day.  4) Stay as active as you can everyday 5) Limit all fluids for the day to less than 2 liters   At the [facility], you and your health needs are our priority. As part of our continuing mission to provide you with exceptional heart care, we have created designated Provider Care Teams. These Care Teams include your primary Cardiologist (physician) and Advanced Practice Providers (APPs- Physician Assistants and Nurse Practitioners) who all work together to provide you with the care you need, when you need it.   You may see any of the following providers on your designated Care Team at your next follow up: Marland Kitchen Dr Glori Bickers . Dr Loralie Champagne . Darrick Grinder, NP . Lyda Jester, PA . Audry Riles, PharmD   Please be sure to bring in all your medications bottles to every appointment.

## 2020-11-10 NOTE — Progress Notes (Signed)
Patient ID: Tiffany [name], female   DOB: 03/28/1946, 75 y.o.   MRN: [id number] PCP: Dr. Sharlet Salina HF Cardiology: Dr. [name]  75 y.o. with history of paroxysmal atrial fibrillation and diastolic CHF was noted to have significant pulmonary hypertension and exertional dyspnea.  She has history of paroxysmal atrial fibrillation and had an episode in 6/16 requiring cardioversion.  She was started on amiodarone but this was stopped with worsening breathing.  Cardiolite in 9/16 showed on ischemia or infarction.     I had her do a RHC in 1/17.  This showed elevated left and right heart filling pressures but also PAH. After cath, she increased her Lasix from 40 qod to 40 daily.  At a prior appointment, I increased Lasix to 40 mg bid and also started her on Opsumit.  At next appointment, I added Adcirca 20 and then titrated it up to 40 mg daily. She continue to be volume overloaded, so  I increased Lasix to 80 qam/40 qpm. She has seemed to do well on this dose.   Echo was done in 6/17, RV mildly dilated with moderately decreased systolic function and PASP 85 mmHg.    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   She had an atrial fibrillation ablation in 6/18.   RHC in 11/18 showed lower PA pressure than in the past with PA 54/16 and PVR 3.7 WU.  Echo showed preserved LV systolic function and normal RV size and function.  Holter showed PACs and PVCs, no atrial fibrillation.  I started her on Toprol XL.  CT chest did not show any significant lung findings. Echo in 12/19 showed EF 65-70%, moderate diastolic dysfunction, mild AS, mild MR, PASP 47 mmHg, normal RV.   She was started on flecainide for paroxysmal atrial fibrillation and has not had symptomatic atrial fibrillation for months now.   Echo in 1/21 showed EF 60-65%, moderately dilated RV with normal systolic function, mild-moderate TR with PASP 88 mmHg, IVC normal.   RHC in 1/22 showed normal filling pressures but still with  severe pulmonary hypertension.  Cardiac output was preserved.  Echo was done today and reviewed, EF 55-60%, moderate RV dilation with moderately decreased systolic function, moderate TR, PASP 117 mmHg with dilated IVC.    She returns today for followup of RV failure and pulmonary hypertension.  Creatinine has been slowly but steadily trending up.  She is followed by nephrology and is making plans for eventual HD.  PD is being investigated, but not sure that she will be a candidate for PD catheter placement due to ventral hernias.  Legs remain swollen.  Weight is up 8 lbs.  She is short of breath after walking 50 feet and with most moderate exertion.  Short of breath with stairs.  No orthopnea.  No lightheadedness.    ECG (personally reviewed): NSR, inferolateral T wave inversions  6 minute walk (2/17): 141 m 6 minute walk (3/17): 182 m 6 minute walk (5/17): 229 m 6 minute walk (10/17): 305 m 6 minute walk (5/18): 256 m 6 minute walk (8/18): 427 m 6 minute walk (11/18): 61 m, oxygen saturation dropped as low as 70s.  6 minute walk (3/19): 260 m 6 minute walk (6/19): 372 m 6 minute walk (12/19): 317 m 6 minute walk (4/21): 305 m 6 minute walk (11/21): 243 m  Labs (12/16): BNP 1187, ANA 1:640, TSH elevated. Labs (2/17): K 5.4 => 3.9, creatinine 1.78 => 1.66, LDL 112, BNP  880 Labs (4/17): K 4.5, creatinine 1.76 Labs (5/17): K 4.1, creatinine 1.79 Labs (8/17): K 3.1, creatinine 1.7, BNP 404 Labs (9/17): K 3.9, creatinine 1.8, BNP 512 Labs (2/18): K 3.5, creatinine 1.71, BNP 157 Labs (5/18): LDL 120 Labs (6/18): K 3.6, creatinine 1.85 Labs (8/18): K 3.5, creatinine 1.78, BNP 88 Labs (11/18): K 3.6 => 3.2, creatinine 1.84 => 1.7, hgb 12.7 => 11.2. Labs (3/19): K 3.8, creatinine 1.74, LDL 123, HDL 56, TSH normal Labs (6/19): K 3.5, creatinine 1.91 Labs (9/19): K 3.5, creatinine 1.9 Labs (1/21): K 3.9, creatinine 2.44 Labs (4/21): K 3.8, creatinine 2.59, BNP 268 Labs (5/21): K 3.5,  creatinine 2.9 Labs (9/21): K 4.4, creatinine 2.7, hgb 11 Labs (11/21): TSH normal Labs (12/21): hgb 10.1, K 4.2, creatinine 2.88 Labs (1/22): K 4.2, creatinine 3.22 Labs (3/22): K 4.4, creatinine 3.5 => 2.9, hgb 11.3, TSH normal  PMH:  1. CKD stage III 2. Bradycardia in setting of metoprolol + amiodarone use.  3. Raynauds phenomenon. 4. BPPV 5. OA right hip 6. Hyperthyroidism 7. Atrial fibrillation: Paroxysmal.  She was on amiodarone in the past but this was stopped when she became more short of breath.  TEE-guided DCCV in 6/16.  - Atrial fibrillation ablation 6/18.  - Holter (12/18) with PACs/PVCs, no atrial fibrillation.  8. HTN 9. 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 - TEE (6/16) with EF 45-50%, mildly dilated RV with mildly decreased systolic function, PASP 36 mmHg.  - RHC (1/17) with mean RA 9, PA 80/29 mean 52, mean PCWP 27, CI 1.97 Fick/1.8 thermo, PVR 7.6 Fick/8.4 thermo.  - Echo (6/17): EF 60-65%, mild MR, mild RV dilation with moderately decreased systolic function, moderate TR, PASP 85 mmHg.  - Echo (6/18): EF [id number], normal diastolic function, normal RV size and systolic function, PASP 36 mmHg. - Echo (12/18): EF 60-65%, grade II diastolic dysfunction, mild MR, normal RV size and systolic function, PASP 52 mmHg.  - RHC (11/18): mean RA 2, PA 54/16 mean 32, mean PCWP 9, CI 3.83, PVR 3.7 WU.  - Echo (12/19): EF 65-70%, moderate diastolic dysfunction, mild AS, mild MR, PASP 47 mmHg.  - Echo (1/21): EF 60-65%, moderately dilated RV with normal systolic function, mild-moderate TR with PASP 88 mmHg, IVC normal.  - Echo (1/22): EF 55-60%, moderate RV dilation with moderately decreased systolic function, moderate TR, PASP 117 mmHg with dilated IVC.   - RHC (1/22): mean RA 4, PA 87/26 mean 47, mean PCWP 9, CI 4.36 Fick, CI 3.61 thermo,  PVR 5.6.  10. Pulmonary hypertension: See RHC above.  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  - TEE (6/16) with mildly dilated RV with mildly decreased systolic function. - ANA 0000000, anti-centromere antibody elevated, anti-SCL-70 antibody negative, h/o Raynauds - PFTs (7/16) with minimal obstructive defect but moderately decreased DLCO.  - V/Q scan (2/17) negative for acute or chronic PE.  11. Cardiolite (9/16) with no ischemia/infarction.  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  13. OSA: Severe on 9/17 sleep study. Now using CPAP.  14. GERD 15. Thyroid nodule: right nodule seen on 3/19 [place].  16. Fe deficiency anemia.  17. Umbilical hernia 18. H/o partial SBO  SH: Married, lives in [place], no smoking, no ETOH.   FH: Adopted.  Daughter has Raynaud's.  ROS: All systems reviewed and negative except as per HPI  Current Outpatient Medications  Medication Sig Dispense Refill  . acetaminophen (TYLENOL) 325 MG tablet Take 2 tablets (650 mg total) by mouth every 4 (four) hours as needed for headache or mild pain.    Marland Kitchen ELIQUIS 5 MG TABS tablet TAKE 1 TABLET BY MOUTH TWICE A DAY 180 tablet 1  . famotidine (PEPCID) 20 MG tablet TAKE 1 TABLET BY MOUTH TWICE A DAY 180 tablet 3  . flecainide (TAMBOCOR) 50 MG tablet TAKE 1 TABLET BY MOUTH TWICE A DAY 180 tablet 2  . fluticasone (FLONASE) 50 MCG/ACT nasal spray Place 1 spray daily as needed into both nostrils for allergies or rhinitis.    . macitentan (OPSUMIT) 10 MG tablet Take 1 tablet (10 mg total) by mouth daily. 30 tablet 11  . Melatonin 2.5 MG CHEW Chew 2.5 mg by mouth as needed.    . metoprolol succinate (TOPROL-XL) 25 MG 24 hr tablet TAKE 1 TABLET BY MOUTH EVERY DAY 90 tablet 3  . polyethylene glycol (MIRALAX / GLYCOLAX) 17 g packet Take 17 g by mouth daily.    . potassium chloride (KLOR-CON) 10 MEQ tablet Take 10 mEq by mouth 2 (two) times daily. Patient takes 2 tablets twice daily    . Probiotic Product (PROBIOTIC-10) CAPS Take 1  capsule by mouth 3 (three) times a week.     . Riociguat (ADEMPAS) 2.5 MG TABS Take 2.5 mg by mouth 3 (three) times daily. 90 tablet 11  . Selexipag 1200 MCG TABS Take 2 tablets by mouth 2 (two) times daily.    Marland Kitchen torsemide (DEMADEX) 20 MG tablet Take 4 tablets (80 mg total) by mouth every morning AND 2 tablets (40 mg total) every evening. 540 tablet 3   No current facility-administered medications for this encounter.   BP 104/60   Pulse 65   Wt 62.7 kg (138 lb 3.2 oz)   SpO2 90%   BMI 26.99 kg/m   General: NAD Neck: JVP 10 cm, no thyromegaly or thyroid nodule.  Lungs: Clear to auscultation bilaterally with normal respiratory effort. CV: Nondisplaced PMI.  Heart regular S1/S2, no S3/S4, 2/6 SEM RUSB.  1+ edema to knees.  No carotid bruit.  Normal pedal pulses.  Abdomen: Soft, nontender, no hepatosplenomegaly, no distention.  Skin: Intact without lesions or rashes.  Neurologic: Alert and oriented x 3.  Psych: Normal affect. Extremities: No clubbing or cyanosis.  HEENT: Normal.   Assessment/Plan: 1. Pulmonary hypertension: Patient has pulmonary arterial hypertension.  She appears to have co-existing diastolic LV dysfunction given elevated PCWP on prior RHC in 1/17.  PFTs did not show significant obstruction, they only showed moderately decreased DLCO consistent with pulmonary vascular disease. V/Q scan did not show evidence for chronic PE.  PVR 7.6 WU by Fick and 8.4 WU by thermodiluation on 1/17 RHC.  Cardiac index was low, 1.97 Fick/1.8 thermo. 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  Given worsening symptoms, RHC was repeated in 11/18.  This showed PVR 3.7 WU with mean PA pressure 32 and normal CI.  RHC done in 1/22 showed normal filling pressures and cardiac output but still with severe pulmonary hypertension.   Echo in  1/22 showed normal LV systolic function but moderately dilated and dysfunctional RV.   - Continue riociguat 2.5 mg tid.  - She has sleep apnea, now using CPAP.  - Continue Opsumit.  - She  has tolerated increase in Uptravi to 2400 mcg bid.  2. Chronic diastolic CHF/RV failure: Elevated PCWP on 1/17 RHC.  NYHA class IIIb symptoms, weight is up.  She is volume overloaded on exam. This is complicated by CKD stage IV.  Metolazone use seems to significantly affect her creatinine.  - Increase torsemide to 80 qam/40 qpm.  BMET today and in 10 days.   3. Atrial fibrillation: Paroxysmal.  She is in NSR today.  She had atrial fibrillation ablation in 6/18. She is now on flecainide.  - Continue Toprol XL 25 mg daily.  - Continue apixaban - Continue flecainide.  4. CKD: Stage IV. ?If scleroderma plays a role.  She is now seeing nephrology and planning for future HD is being done.  PD was explored, but she has extensive ventral hernias and does not seem to be a good candidate for PD catheter placement.  Surgical repair of the hernias under general anesthesia would be high risk given her severe PH.  - BMET today.  5. CREST syndrome: Seeing Dr. Amil Amen.   Followup in 10 days to reassess volume.    Loralie Champagne 11/10/2020

## 2020-11-14 ENCOUNTER — Encounter (HOSPITAL_COMMUNITY): Payer: Medicare PPO | Admitting: Cardiology

## 2020-11-19 ENCOUNTER — Other Ambulatory Visit: Payer: Self-pay

## 2020-11-19 ENCOUNTER — Ambulatory Visit (HOSPITAL_COMMUNITY)
Admission: RE | Admit: 2020-11-19 | Discharge: 2020-11-19 | Disposition: A | Discharge status: Home / Self Care | Payer: Medicare PPO | Source: Ambulatory Visit | Attending: Cardiology | Admitting: Cardiology

## 2020-11-19 ENCOUNTER — Encounter (HOSPITAL_COMMUNITY): Payer: Self-pay | Admitting: Cardiology

## 2020-11-19 VITALS — BP 102/60 | HR 72 | Wt 137.8 lb

## 2020-11-19 DIAGNOSIS — R600 Localized edema: Secondary | ICD-10-CM | POA: Insufficient documentation

## 2020-11-19 DIAGNOSIS — G4730 Sleep apnea, unspecified: Secondary | ICD-10-CM | POA: Diagnosis not present

## 2020-11-19 DIAGNOSIS — I272 Pulmonary hypertension, unspecified: Secondary | ICD-10-CM

## 2020-11-19 DIAGNOSIS — I5032 Chronic diastolic (congestive) heart failure: Secondary | ICD-10-CM | POA: Insufficient documentation

## 2020-11-19 DIAGNOSIS — I480 Paroxysmal atrial fibrillation: Secondary | ICD-10-CM

## 2020-11-19 DIAGNOSIS — K439 Ventral hernia without obstruction or gangrene: Secondary | ICD-10-CM | POA: Diagnosis not present

## 2020-11-19 DIAGNOSIS — Z79899 Other long term (current) drug therapy: Secondary | ICD-10-CM | POA: Diagnosis not present

## 2020-11-19 DIAGNOSIS — I2721 Secondary pulmonary arterial hypertension: Secondary | ICD-10-CM | POA: Insufficient documentation

## 2020-11-19 DIAGNOSIS — I130 Hypertensive heart and chronic kidney disease with heart failure and stage 1 through stage 4 chronic kidney disease, or unspecified chronic kidney disease: Secondary | ICD-10-CM | POA: Diagnosis not present

## 2020-11-19 DIAGNOSIS — Z7901 Long term (current) use of anticoagulants: Secondary | ICD-10-CM | POA: Diagnosis not present

## 2020-11-19 DIAGNOSIS — M341 CR(E)ST syndrome: Secondary | ICD-10-CM | POA: Diagnosis not present

## 2020-11-19 DIAGNOSIS — I770 Arteriovenous fistula, acquired: Secondary | ICD-10-CM | POA: Diagnosis not present

## 2020-11-19 DIAGNOSIS — R0602 Shortness of breath: Secondary | ICD-10-CM | POA: Diagnosis not present

## 2020-11-19 DIAGNOSIS — N184 Chronic kidney disease, stage 4 (severe): Secondary | ICD-10-CM | POA: Diagnosis not present

## 2020-11-19 LAB — BASIC METABOLIC PANEL
Anion gap: 8 (ref 5–15)
BUN: 46 mg/dL — ABNORMAL HIGH (ref 8–23)
CO2: 23 mmol/L (ref 22–32)
Calcium: 9.2 mg/dL (ref 8.9–10.3)
Chloride: 105 mmol/L (ref 98–111)
Creatinine, Ser: 3.37 mg/dL — ABNORMAL HIGH (ref 0.44–1.00)
GFR, Estimated: 14 mL/min — ABNORMAL LOW (ref >60)
Glucose, Bld: 96 mg/dL (ref 70–99)
Potassium: 4.6 mmol/L (ref 3.5–5.1)
Sodium: 136 mmol/L (ref 135–145)

## 2020-11-19 MED ORDER — METOLAZONE 2.5 MG PO TABS: 2.5000 mg | ORAL_TABLET | ORAL | 3 refills | Status: DC

## 2020-11-19 MED ORDER — POTASSIUM CHLORIDE CRYS ER 10 MEQ PO TBCR: 10.0000 meq | EXTENDED_RELEASE_TABLET | Freq: Two times a day (BID) | ORAL | 3 refills | Status: DC

## 2020-11-19 NOTE — Patient Instructions (Addendum)
Labs done today. We will contact you only if your labs are abnormal.  INCREASE Torsemide to '80mg'$  (4 tablets) by mouth 2 times daily for 5 days THEN DECREASE back to '80mg'$  (4 tablets) every morning and '40mg'$  (2 tablets) every evening.  START Metolazone 2.'5mg'$  (1 tablet) by mouth every Wednesday with morning Torsemide. Also take an extra 2 tablets(45mq) of potassium on the days you take the Metolazone.   No other medication changes were made. Please continue all current medications as prescribed.  Your physician recommends that you schedule a follow-up appointment in: 3 weeks for an appointment with our office   If you have any questions or concerns before your next appointment please send [place] message through [place] call our office at [phone]    TO LEAVE A MESSAGE FOR THE NURSE SELECT OPTION 2, PLEASE LEAVE A MESSAGE INCLUDING: . YOUR NAME . DATE OF BIRTH . CALL BACK NUMBER . REASON FOR CALL**this is important as we prioritize the call backs  YOU WILL RECEIVE A CALL BACK THE SAME DAY AS LONG AS YOU CALL BEFORE 4:00 PM   Do the following things EVERYDAY: 1) Weigh yourself in the morning before breakfast. Write it down and keep it in a log. 2) Take your medicines as prescribed 3) Eat low salt foods--Limit salt (sodium) to 2000 mg per day.  4) Stay as active as you can everyday 5) Limit all fluids for the day to less than 2 liters   At [facility] you and your health needs are our priority. As part of our continuing mission to provide you with exceptional heart care, we have created designated Provider Care Teams. These Care Teams include your primary Cardiologist (physician) and Advanced Practice Providers (APPs- Physician Assistants and Nurse Practitioners) who all work together to provide you with the care you need, when you need it.   You may see any of the following providers on your designated Care Team at your next follow up: .Marland KitchenDr DGlori Bickers. Dr DLoralie Champagne. ADarrick Grinder NP . BLyda Jester PA . LAudry Riles PharmD   Please be sure to bring in all your medications bottles to every appointment.

## 2020-11-20 NOTE — Progress Notes (Signed)
Patient ID: Tiffany [name], female   DOB: 02-Oct-1945, 75 y.o.   MRN: [id number] PCP: Dr. Sharlet Salina HF Cardiology: Dr. [name]  75 y.o. with history of paroxysmal atrial fibrillation and diastolic CHF was noted to have significant pulmonary hypertension and exertional dyspnea.  She has history of paroxysmal atrial fibrillation and had an episode in 6/16 requiring cardioversion.  She was started on amiodarone but this was stopped with worsening breathing.  Cardiolite in 9/16 showed on ischemia or infarction.     I had her do a RHC in 1/17.  This showed elevated left and right heart filling pressures but also PAH. After cath, she increased her Lasix from 40 qod to 40 daily.  At a prior appointment, I increased Lasix to 40 mg bid and also started her on Opsumit.  At next appointment, I added Adcirca 20 and then titrated it up to 40 mg daily. She continue to be volume overloaded, so  I increased Lasix to 80 qam/40 qpm. She has seemed to do well on this dose.   Echo was done in 6/17, RV mildly dilated with moderately decreased systolic function and PASP 85 mmHg.    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   She had an atrial fibrillation ablation in 6/18.   RHC in 11/18 showed lower PA pressure than in the past with PA 54/16 and PVR 3.7 WU.  Echo showed preserved LV systolic function and normal RV size and function.  Holter showed PACs and PVCs, no atrial fibrillation.  I started her on Toprol XL.  CT chest did not show any significant lung findings. Echo in 12/19 showed EF 65-70%, moderate diastolic dysfunction, mild AS, mild MR, PASP 47 mmHg, normal RV.   She was started on flecainide for paroxysmal atrial fibrillation and has not had symptomatic atrial fibrillation for months now.   Echo in 1/21 showed EF 60-65%, moderately dilated RV with normal systolic function, mild-moderate TR with PASP 88 mmHg, IVC normal.   RHC in 1/22 showed normal filling pressures but still with  severe pulmonary hypertension.  Cardiac output was preserved.  Echo was done today and reviewed, EF 55-60%, moderate RV dilation with moderately decreased systolic function, moderate TR, PASP 117 mmHg with dilated IVC.    She returns today for followup of RV failure and pulmonary hypertension.  Creatinine has been slowly but steadily trending up.  She is followed by nephrology and is making plans for eventual HD.  She is not a candidate for PD catheter placement due to ventral hernias (would need extensive operation to correct).  Legs remain swollen.  Weight is unchanged despite increase in torsemide.  She is short of breath after walking 50 feet and with most moderate exertion but not short of breath at rest.  Short of breath with stairs.  No orthopnea.  No lightheadedness.  Using CPAP.   6 minute walk (2/17): 141 m 6 minute walk (3/17): 182 m 6 minute walk (5/17): 229 m 6 minute walk (10/17): 305 m 6 minute walk (5/18): 256 m 6 minute walk (8/18): 427 m 6 minute walk (11/18): 61 m, oxygen saturation dropped as low as 70s.  6 minute walk (3/19): 260 m 6 minute walk (6/19): 372 m 6 minute walk (12/19): 317 m 6 minute walk (4/21): 305 m 6 minute walk (11/21): 243 m  Labs (12/16): BNP 1187, ANA 1:640, TSH elevated. Labs (2/17): K 5.4 => 3.9, creatinine 1.78 => 1.66, LDL 112, BNP  880 Labs (4/17): K 4.5, creatinine 1.76 Labs (5/17): K 4.1, creatinine 1.79 Labs (8/17): K 3.1, creatinine 1.7, BNP 404 Labs (9/17): K 3.9, creatinine 1.8, BNP 512 Labs (2/18): K 3.5, creatinine 1.71, BNP 157 Labs (5/18): LDL 120 Labs (6/18): K 3.6, creatinine 1.85 Labs (8/18): K 3.5, creatinine 1.78, BNP 88 Labs (11/18): K 3.6 => 3.2, creatinine 1.84 => 1.7, hgb 12.7 => 11.2. Labs (3/19): K 3.8, creatinine 1.74, LDL 123, HDL 56, TSH normal Labs (6/19): K 3.5, creatinine 1.91 Labs (9/19): K 3.5, creatinine 1.9 Labs (1/21): K 3.9, creatinine 2.44 Labs (4/21): K 3.8, creatinine 2.59, BNP 268 Labs (5/21): K 3.5,  creatinine 2.9 Labs (9/21): K 4.4, creatinine 2.7, hgb 11 Labs (11/21): TSH normal Labs (12/21): hgb 10.1, K 4.2, creatinine 2.88 Labs (1/22): K 4.2, creatinine 3.22 Labs (3/22): K 4.4, creatinine 3.5 => 2.9, hgb 11.3, TSH normal  PMH:  1. CKD stage 4 2. Bradycardia in setting of metoprolol + amiodarone use.  3. Raynauds phenomenon. 4. BPPV 5. OA right hip 6. Hyperthyroidism 7. Atrial fibrillation: Paroxysmal.  She was on amiodarone in the past but this was stopped when she became more short of breath.  TEE-guided DCCV in 6/16.  - Atrial fibrillation ablation 6/18.  - Holter (12/18) with PACs/PVCs, no atrial fibrillation.  8. HTN 9. 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 - TEE (6/16) with EF 45-50%, mildly dilated RV with mildly decreased systolic function, PASP 36 mmHg.  - RHC (1/17) with mean RA 9, PA 80/29 mean 52, mean PCWP 27, CI 1.97 Fick/1.8 thermo, PVR 7.6 Fick/8.4 thermo.  - Echo (6/17): EF 60-65%, mild MR, mild RV dilation with moderately decreased systolic function, moderate TR, PASP 85 mmHg.  - Echo (6/18): EF 123456, normal diastolic function, normal RV size and systolic function, PASP 36 mmHg. - Echo (12/18): EF 60-65%, grade II diastolic dysfunction, mild MR, normal RV size and systolic function, PASP 52 mmHg.  - RHC (11/18): mean RA 2, PA 54/16 mean 32, mean PCWP 9, CI 3.83, PVR 3.7 WU.  - Echo (12/19): EF 65-70%, moderate diastolic dysfunction, mild AS, mild MR, PASP 47 mmHg.  - Echo (1/21): EF 60-65%, moderately dilated RV with normal systolic function, mild-moderate TR with PASP 88 mmHg, IVC normal.  - Echo (1/22): EF 55-60%, moderate RV dilation with moderately decreased systolic function, moderate TR, PASP 117 mmHg with dilated IVC.   - RHC (1/22): mean RA 4, PA 87/26 mean 47, mean PCWP 9, CI 4.36 Fick, CI 3.61 thermo, PVR  5.6.  10. Pulmonary hypertension: See RHC above.  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  - TEE (6/16) with mildly dilated RV with mildly decreased systolic function. - ANA 0000000, anti-centromere antibody elevated, anti-SCL-70 antibody negative, h/o Raynauds - PFTs (7/16) with minimal obstructive defect but moderately decreased DLCO.  - V/Q scan (2/17) negative for acute or chronic PE.  11. Cardiolite (9/16) with no ischemia/infarction.  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  13. OSA: Severe on 9/17 sleep study. Now using CPAP.  14. GERD 15. Thyroid nodule: right nodule seen on 3/19 [place].  16. Fe deficiency anemia.  17. Umbilical hernia 18. H/o partial SBO  SH: Married, lives in [place], no smoking, no ETOH.   FH: Adopted.  Daughter has Raynaud's.  ROS: All systems reviewed and negative except as per HPI  Current Outpatient Medications  Medication Sig Dispense Refill  . acetaminophen (TYLENOL) 325 MG tablet Take 2 tablets (650 mg total) by mouth every 4 (four) hours as needed for headache or mild pain.    Marland Kitchen ELIQUIS 5 MG TABS tablet TAKE 1 TABLET BY MOUTH TWICE A DAY 180 tablet 1  . famotidine (PEPCID) 20 MG tablet TAKE 1 TABLET BY MOUTH TWICE A DAY 180 tablet 3  . flecainide (TAMBOCOR) 50 MG tablet TAKE 1 TABLET BY MOUTH TWICE A DAY 180 tablet 2  . fluticasone (FLONASE) 50 MCG/ACT nasal spray Place 1 spray daily as needed into both nostrils for allergies or rhinitis.    . macitentan (OPSUMIT) 10 MG tablet Take 1 tablet (10 mg total) by mouth daily. 30 tablet 11  . Melatonin 2.5 MG CHEW Chew 2.5 mg by mouth as needed.    . metolazone (ZAROXOLYN) 2.5 MG tablet Take 1 tablet (2.5 mg total) by mouth once a week. On Wednesdays with morning Torsemide 12 tablet 3  . metoprolol succinate (TOPROL-XL) 25 MG 24 hr tablet TAKE 1 TABLET BY MOUTH EVERY DAY 90 tablet 3  . polyethylene glycol (MIRALAX / GLYCOLAX) 17 g packet Take 17 g by mouth daily.    . Probiotic Product (PROBIOTIC-10) CAPS  Take 1 capsule by mouth 3 (three) times a week.     . Riociguat (ADEMPAS) 2.5 MG TABS Take 2.5 mg by mouth 3 (three) times daily. 90 tablet 11  . Selexipag (UPTRAVI) 1200 MCG TABS Take 2,400 mcg by mouth 2 (two) times daily.    Marland Kitchen torsemide (DEMADEX) 20 MG tablet Take 4 tablets (80 mg total) by mouth every morning AND 2 tablets (40 mg total) every evening. 540 tablet 3  . potassium chloride (KLOR-CON) 10 MEQ tablet Take 1 tablet (10 mEq total) by mouth 2 (two) times daily. On the days you take Metalozone take 2 extra tablets. 200 tablet 3   No current facility-administered medications for this encounter.   BP 102/60   Pulse 72   Wt 62.5 kg (137 lb 12.8 oz)   SpO2 90%   BMI 26.91 kg/m   General: NAD Neck: JVP 12 cm, no thyromegaly or thyroid nodule.  Lungs: Clear to auscultation bilaterally with normal respiratory effort. CV: Nondisplaced PMI.  Heart regular S1/S2, no S3/S4, no murmur.  1+ edema to knees.  No carotid bruit.  Normal pedal pulses.  Abdomen: Soft, nontender, no hepatosplenomegaly, no distention.  Skin: Intact without lesions or rashes.  Neurologic: Alert and oriented x 3.  Psych: Normal affect. Extremities: No clubbing or cyanosis.  HEENT: Normal.   Assessment/Plan: 1. Pulmonary hypertension: Patient has pulmonary arterial hypertension.  She appears to have co-existing diastolic LV dysfunction given elevated PCWP on prior RHC in 1/17.  PFTs did not show significant obstruction, they only showed moderately decreased DLCO consistent with pulmonary vascular disease. V/Q scan did not show evidence for chronic PE.  PVR 7.6 WU by Fick and 8.4 WU by thermodiluation on 1/17 RHC.  Cardiac index was low, 1.97 Fick/1.8 thermo. 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  Given worsening symptoms, RHC was repeated in 11/18.   This showed PVR 3.7 WU with mean PA pressure 32 and normal CI.  RHC done in 1/22 showed normal filling pressures and cardiac output but still with severe pulmonary hypertension.   Echo in 1/22 showed  normal LV systolic function but moderately dilated and dysfunctional RV.   - Continue riociguat 2.5 mg tid.  - She has sleep apnea, now using CPAP.  - Continue Opsumit.  - She has tolerated increase in Uptravi to 2400 mcg bid.  - Defer 6 minute walk with ongoing marked volume overload.  2. Chronic diastolic CHF/RV failure: Elevated PCWP on 1/17 RHC.  NYHA class IIIb symptoms, weight is not down despite increasing torsemide.  She is still volume overloaded on exam. This is complicated by CKD stage IV.  - Increase torsemide to 80 mg bid x 5 days then back to 80 qam/40 qpm.   - Take metolazone 2.5 mg with am torsemide every Wednesday (she will take extra KCl 20 with metolazone).   - BMET today and in 10 days.   3. Atrial fibrillation: Paroxysmal.  She is in NSR today.  She had atrial fibrillation ablation in 6/18. She is now on flecainide.  - Continue Toprol XL 25 mg daily.  - Continue apixaban - Continue flecainide.  4. CKD: Stage IV. ?If scleroderma plays a role.  She is now seeing nephrology and planning for future HD is being done.  PD was explored, but she has extensive ventral hernias and does not seem to be a good candidate for PD catheter placement.  Surgical repair of the hernias under general anesthesia would be high risk given her severe PH.  - BMET today.  - She will be seeing Dr. Carlis Abbott about getting AV fistula placed.  5. CREST syndrome: Seeing Dr. Amil Amen.   Followup in 3 wks with me or APP.   Loralie Champagne 11/20/2020

## 2020-11-30 ENCOUNTER — Other Ambulatory Visit: Payer: Self-pay

## 2020-11-30 DIAGNOSIS — N184 Chronic kidney disease, stage 4 (severe): Secondary | ICD-10-CM

## 2020-12-04 ENCOUNTER — Other Ambulatory Visit: Payer: Self-pay | Admitting: Internal Medicine

## 2020-12-04 ENCOUNTER — Ambulatory Visit (HOSPITAL_COMMUNITY)
Admission: RE | Admit: 2020-12-04 | Discharge: 2020-12-04 | Disposition: A | Discharge status: Home / Self Care | Payer: Medicare PPO | Source: Ambulatory Visit | Attending: Internal Medicine | Admitting: Internal Medicine

## 2020-12-04 ENCOUNTER — Other Ambulatory Visit: Payer: Self-pay

## 2020-12-04 DIAGNOSIS — K429 Umbilical hernia without obstruction or gangrene: Secondary | ICD-10-CM | POA: Diagnosis not present

## 2020-12-04 DIAGNOSIS — K862 Cyst of pancreas: Secondary | ICD-10-CM | POA: Insufficient documentation

## 2020-12-04 MED ORDER — GADOBUTROL 1 MMOL/ML IV SOLN
6.0000 mL | Freq: Once | INTRAVENOUS | Status: AC | PRN
  Administered 2020-12-04: 6 mL via INTRAVENOUS

## 2020-12-10 ENCOUNTER — Ambulatory Visit: Payer: Medicare PPO | Admitting: Vascular Surgery

## 2020-12-10 ENCOUNTER — Ambulatory Visit (HOSPITAL_COMMUNITY)
Admission: RE | Admit: 2020-12-10 | Discharge: 2020-12-10 | Disposition: A | Discharge status: Home / Self Care | Payer: Medicare PPO | Source: Ambulatory Visit | Attending: Vascular Surgery | Admitting: Vascular Surgery

## 2020-12-10 ENCOUNTER — Other Ambulatory Visit: Payer: Self-pay

## 2020-12-10 ENCOUNTER — Ambulatory Visit (INDEPENDENT_AMBULATORY_CARE_PROVIDER_SITE_OTHER)
Admission: RE | Admit: 2020-12-10 | Discharge: 2020-12-10 | Disposition: A | Discharge status: Home / Self Care | Payer: Medicare PPO | Source: Ambulatory Visit | Attending: Vascular Surgery | Admitting: Vascular Surgery

## 2020-12-10 ENCOUNTER — Encounter: Payer: Self-pay | Admitting: Vascular Surgery

## 2020-12-10 DIAGNOSIS — N185 Chronic kidney disease, stage 5: Secondary | ICD-10-CM | POA: Insufficient documentation

## 2020-12-10 DIAGNOSIS — N184 Chronic kidney disease, stage 4 (severe): Secondary | ICD-10-CM | POA: Insufficient documentation

## 2020-12-10 NOTE — Progress Notes (Signed)
Patient name: ANNECY STEFF MRN: [id number] DOB: 11-22-1945 Sex: female  REASON FOR CONSULT: Dialysis access evaluation  HPI: MARIZOL RAMIREZ is a 75 y.o. 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 fib status post ablation, stage V CKD, and CREST syndrome that presents for evaluation of permanent HD access.    Previously seen for PD catheter placement and ultimately I sent her to general surgery given an umbilical hernia.  Ultimately we decided this was not in her best interest after evaluation by general surgery.  She now presents for AVF vs graft placement.  On follow-up today she has not started dialysis yet.  She is right-handed and would like to avoid placement in the right arm.  No central chest wall insertions.  Nephrology note states place AVF now and wait on AVG.  Past Medical History:  Diagnosis Date  . Arthritis 04-20-12   Osteoarthritis-right hip  . Atrial fibrillation status post cardioversion, 01/30/15 maintaining SR.  01/31/2015   a. 01/2015 s/p TEE/DCCV;  b. 02/06/2015 recurrent AF noted, amio added;  c. CHA2DS2VASc = 3-->eliquis.  Marland Kitchen BENIGN POSITIONAL VERTIGO   . Cataract   . CHF (congestive heart failure) ([place])   . Complication of anesthesia 04-20-12   some issues with prolonged sedation after anesthesia  . Diverticulosis   . DYSLIPIDEMIA   . Esophageal stricture   . GERD   . HYPERTENSION    a. severe, pt intol of med tx, Pt. has severe "whitecoat" syndrome and refused medical therapy.  . Hypothyroidism 09/29/2014   a. pt did not tolerate synthroid and this was subsequently discontinued.  . Mild LV dysfunction    a. 01/2015 Echo: EF 45-50%, mild MR, mildly dil LA, mod dil RV with mod to sev reduced fxn, mod-sev TR, PASP 75mHg.  . Osteopenia   . Raynaud disease   . URINARY INCONTINENCE 04-20-12   occ. with nighttime sleep pattern    Past Surgical History:  Procedure Laterality Date   . ATRIAL FIBRILLATION ABLATION N/A 01/26/2017   Procedure: Atrial Fibrillation Ablation;  Surgeon: AThompson Grayer MD;  [facility];  Service: Cardiovascular;  Laterality: N/A;  . breast ultrasound Right 09/13/13   There is no sonographic evidence of malignancy. the [id number] cyst in (R) breast is consistent with a benign finding. repeat in 1 year  . CARDIAC CATHETERIZATION N/A 08/29/2015   Procedure: Right Heart Cath;  Surgeon: DLarey Dresser MD;  [facility];  Service: Cardiovascular;  Laterality: N/A;  . CARDIOVERSION N/A 01/30/2015   Procedure: CARDIOVERSION;  Surgeon: MSanda Klein MD;  [facility]: [facility];  Service: Cardiovascular;  Laterality: N/A;  . CATARACT EXTRACTION, BILATERAL    . CHOLECYSTECTOMY     '90-"sludge"  . NASAL FRACTURE SURGERY  2006  . RIGHT HEART CATH N/A 06/30/2017   Procedure: RIGHT HEART CATH;  Surgeon: MLarey Dresser MD;  [facility];  Service: Cardiovascular;  Laterality: N/A;  . RIGHT HEART CATH N/A 08/09/2020   Procedure: RIGHT HEART CATH;  Surgeon: MLarey Dresser MD;  [facility];  Service: Cardiovascular;  Laterality: N/A;  . TEE WITHOUT CARDIOVERSION N/A 01/30/2015   Procedure: TRANSESOPHAGEAL ECHOCARDIOGRAM (TEE);  Surgeon: MSanda Klein MD;  [facility];  Service: Cardiovascular;  Laterality: N/A;  . TEE WITHOUT CARDIOVERSION N/A 01/25/2017   Procedure: TRANSESOPHAGEAL ECHOCARDIOGRAM (TEE);  Surgeon: RFay Records MD;  [facility]  Service: Cardiovascular;  Laterality: N/A;  . TOTAL HIP ARTHROPLASTY  04/26/2012   Procedure: TOTAL HIP ARTHROPLASTY ANTERIOR APPROACH;  Surgeon: Mauri Pole, MD;  Location: [facility];  Service: Orthopedics;  Laterality: Right;  . TUBAL LIGATION  1980    Family History  Adopted: Yes  Problem Relation Age of Onset  . Other Other        patient was adopted    SOCIAL HISTORY: Social History   Socioeconomic History   . Marital status: Married    Spouse name: Not on file  . Number of children: 2  . Years of education: Not on file  . Highest education level: Not on file  Occupational History  . Not on file  Tobacco Use  . Smoking status: Never Smoker  . Smokeless tobacco: Never Used  Vaping Use  . Vaping Use: Never used  Substance and Sexual Activity  . Alcohol use: Yes    Comment: less than 1 drink per week  . Drug use: No  . Sexual activity: Yes  Other Topics Concern  . Not on file  Social History Narrative   She enjoys birding.  Lives at home with husband.   Married.   retired Quarry manager, Tax inspector   Social Determinants of Radio broadcast assistant Strain: Not on [employer] Insecurity: Not on file  Transportation Needs: Not on file  Physical Activity: Not on file  Stress: Not on file  Social Connections: Not on file  Intimate Partner Violence: Not on file    Allergies  Allergen Reactions  . Levothyroxine Shortness Of Breath and Other (See Comments)    Exhaustion also - Per pt her this medication could have not been the reason for her symptoms  . Statins Other (See Comments)    "Pain, weakness and kidney problems"  . Prednisone Other (See Comments)    Severe insomnia  . Penicillins Other (See Comments)    dry mouth Has patient had a PCN reaction causing immediate rash, facial/tongue/throat swelling, SOB or lightheadedness with hypotension: No Has patient had a PCN reaction causing severe rash involving mucus membranes or skin necrosis: No Has patient had a PCN reaction that required hospitalization No Has patient had a PCN reaction occurring within the last 10 years: No If all of the above answers are "NO", then may proceed with Cephalosporin use.     Current Outpatient Medications  Medication Sig Dispense Refill  . acetaminophen (TYLENOL) 325 MG tablet Take 2 tablets (650 mg total) by mouth every 4 (four) hours as needed for headache or mild  pain.    Marland Kitchen ELIQUIS 5 MG TABS tablet TAKE 1 TABLET BY MOUTH TWICE A DAY 180 tablet 1  . famotidine (PEPCID) 20 MG tablet TAKE 1 TABLET BY MOUTH TWICE A DAY 180 tablet 3  . flecainide (TAMBOCOR) 50 MG tablet TAKE 1 TABLET BY MOUTH TWICE A DAY 180 tablet 2  . fluticasone (FLONASE) 50 MCG/ACT nasal spray Place 1 spray daily as needed into both nostrils for allergies or rhinitis.    . macitentan (OPSUMIT) 10 MG tablet Take 1 tablet (10 mg total) by mouth daily. 30 tablet 11  . Melatonin 2.5 MG CHEW Chew 2.5 mg by mouth as needed.    . metolazone (ZAROXOLYN) 2.5 MG tablet Take 1 tablet (2.5 mg total) by mouth once a week. On Wednesdays with morning Torsemide 12 tablet 3  . metoprolol succinate (TOPROL-XL) 25 MG 24 hr tablet TAKE 1 TABLET  BY MOUTH EVERY DAY 90 tablet 3  . polyethylene glycol (MIRALAX / GLYCOLAX) 17 g packet Take 17 g by mouth daily.    . potassium chloride (KLOR-CON) 10 MEQ tablet Take 1 tablet (10 mEq total) by mouth 2 (two) times daily. On the days you take Metalozone take 2 extra tablets. 200 tablet 3  . Probiotic Product (PROBIOTIC-10) CAPS Take 1 capsule by mouth 3 (three) times a week.     . Riociguat (ADEMPAS) 2.5 MG TABS Take 2.5 mg by mouth 3 (three) times daily. 90 tablet 11  . Selexipag (UPTRAVI) 1200 MCG TABS Take 2,400 mcg by mouth 2 (two) times daily.    Marland Kitchen torsemide (DEMADEX) 20 MG tablet Take 4 tablets (80 mg total) by mouth every morning AND 2 tablets (40 mg total) every evening. 540 tablet 3   No current facility-administered medications for this visit.    REVIEW OF SYSTEMS:  '[X]'$  denotes positive finding, '[ ]'$  denotes negative finding Cardiac  Comments:  Chest pain or chest pressure:    Shortness of breath upon exertion:    Short of breath when lying flat:    Irregular heart rhythm:        Vascular    Pain in calf, thigh, or hip brought on by ambulation:    Pain in feet at night that wakes you up from your sleep:     Blood clot in your veins:    Leg swelling:          Pulmonary    Oxygen at home:    Productive cough:     Wheezing:         Neurologic    Sudden weakness in arms or legs:     Sudden numbness in arms or legs:     Sudden onset of difficulty speaking or slurred speech:    Temporary loss of vision in one eye:     Problems with dizziness:         Gastrointestinal    Blood in stool:     Vomited blood:         Genitourinary    Burning when urinating:     Blood in urine:        Psychiatric    Major depression:         Hematologic    Bleeding problems:    Problems with blood clotting too easily:        Skin    Rashes or ulcers:        Constitutional    Fever or chills:      PHYSICAL EXAM: Vitals:   12/10/20 1559  BP: (!) 98/57  Pulse: 71  Resp: 14  Temp: 98.2 F (36.8 C)  TempSrc: Temporal  SpO2: (!) 87%  Weight: 132 lb (59.9 kg)  Height: 5' (1.524 m)    GENERAL: The patient is a well-nourished female, in no acute distress. The vital signs are documented above. CARDIAC: There is a regular rate and rhythm.  VASCULAR:  Palpable radial pulses bilateral upper extremities PULMONARY: No respiratory distress. ABDOMEN: Soft and non-tender.  Upper midline laparotomy incision with umbilical hernia MUSCULOSKELETAL: There are no major deformities or cyanosis. NEUROLOGIC: No focal weakness or paresthesias are detected. SKIN: There are no ulcers or rashes noted. PSYCHIATRIC: The patient has a normal affect.   DATA:   Upper extremity arterial duplex shows triphasic waveforms in both upper extremities.  Extremity vein mapping shows small surface veins bilaterally.  Assessment/Plan:  75 year old female with  Stage V CKD who presents for evaluation of permanent hemodialysis access (after recent evaluation for PD catheter).  Patient is right-handed and would like to have placement in the left arm which is very reasonable.  That being said, she has no usable surface veins on vein mapping today.  I discussed with her  would likely would require AV graft placement in the left upper extremity.  The referral from nephrology states placed AV fistula now and wait on AV graft.  Appears her kidney function has been relatively stable at stage V CKD per Dr. Joelyn Oms note.  She will see her nephrologist again on Monday.  I will send him a note and discussed that he let us know when he feels she needs to be scheduled.  I also discussed potentially needing a catheter in the interim.  Risks and benefits discussed.   [name], MD [facility] Office: [phone]

## 2020-12-13 ENCOUNTER — Other Ambulatory Visit (HOSPITAL_COMMUNITY): Payer: Self-pay

## 2020-12-13 ENCOUNTER — Ambulatory Visit (HOSPITAL_COMMUNITY)
Admission: RE | Admit: 2020-12-13 | Discharge: 2020-12-13 | Disposition: A | Discharge status: Home / Self Care | Payer: Medicare PPO | Source: Ambulatory Visit | Attending: Cardiology | Admitting: Cardiology

## 2020-12-13 ENCOUNTER — Other Ambulatory Visit: Payer: Self-pay

## 2020-12-13 ENCOUNTER — Encounter (HOSPITAL_COMMUNITY): Payer: Self-pay | Admitting: Cardiology

## 2020-12-13 VITALS — BP 90/50 | HR 88 | Wt 132.2 lb

## 2020-12-13 DIAGNOSIS — I4510 Unspecified right bundle-branch block: Secondary | ICD-10-CM | POA: Insufficient documentation

## 2020-12-13 DIAGNOSIS — I272 Pulmonary hypertension, unspecified: Secondary | ICD-10-CM | POA: Diagnosis not present

## 2020-12-13 DIAGNOSIS — I2721 Secondary pulmonary arterial hypertension: Secondary | ICD-10-CM | POA: Diagnosis not present

## 2020-12-13 DIAGNOSIS — I480 Paroxysmal atrial fibrillation: Secondary | ICD-10-CM | POA: Insufficient documentation

## 2020-12-13 DIAGNOSIS — I4891 Unspecified atrial fibrillation: Secondary | ICD-10-CM | POA: Diagnosis not present

## 2020-12-13 DIAGNOSIS — I5032 Chronic diastolic (congestive) heart failure: Secondary | ICD-10-CM | POA: Diagnosis not present

## 2020-12-13 DIAGNOSIS — I130 Hypertensive heart and chronic kidney disease with heart failure and stage 1 through stage 4 chronic kidney disease, or unspecified chronic kidney disease: Secondary | ICD-10-CM | POA: Insufficient documentation

## 2020-12-13 DIAGNOSIS — M341 CR(E)ST syndrome: Secondary | ICD-10-CM | POA: Insufficient documentation

## 2020-12-13 DIAGNOSIS — G4730 Sleep apnea, unspecified: Secondary | ICD-10-CM | POA: Insufficient documentation

## 2020-12-13 DIAGNOSIS — Z79899 Other long term (current) drug therapy: Secondary | ICD-10-CM | POA: Diagnosis not present

## 2020-12-13 DIAGNOSIS — Z7901 Long term (current) use of anticoagulants: Secondary | ICD-10-CM | POA: Insufficient documentation

## 2020-12-13 DIAGNOSIS — K439 Ventral hernia without obstruction or gangrene: Secondary | ICD-10-CM | POA: Insufficient documentation

## 2020-12-13 DIAGNOSIS — N184 Chronic kidney disease, stage 4 (severe): Secondary | ICD-10-CM | POA: Insufficient documentation

## 2020-12-13 LAB — CBC
HCT: 35.2 % — ABNORMAL LOW (ref 36.0–46.0)
Hemoglobin: 11 g/dL — ABNORMAL LOW (ref 12.0–15.0)
MCH: 28.5 pg (ref 26.0–34.0)
MCHC: 31.3 g/dL (ref 30.0–36.0)
MCV: 91.2 fL (ref 80.0–100.0)
Platelets: 156 10*3/uL (ref 150–400)
RBC: 3.86 MIL/uL — ABNORMAL LOW (ref 3.87–5.11)
RDW: 17.2 % — ABNORMAL HIGH (ref 11.5–15.5)
WBC: 4.2 10*3/uL (ref 4.0–10.5)
nRBC: 0 % (ref 0.0–0.2)

## 2020-12-13 LAB — BASIC METABOLIC PANEL
Anion gap: 12 (ref 5–15)
BUN: 76 mg/dL — ABNORMAL HIGH (ref 8–23)
CO2: 24 mmol/L (ref 22–32)
Calcium: 9.1 mg/dL (ref 8.9–10.3)
Chloride: 100 mmol/L (ref 98–111)
Creatinine, Ser: 4 mg/dL — ABNORMAL HIGH (ref 0.44–1.00)
GFR, Estimated: 11 mL/min — ABNORMAL LOW (ref >60)
Glucose, Bld: 98 mg/dL (ref 70–99)
Potassium: 3.5 mmol/L (ref 3.5–5.1)
Sodium: 136 mmol/L (ref 135–145)

## 2020-12-13 MED ORDER — METOLAZONE 2.5 MG PO TABS: 2.5000 mg | ORAL_TABLET | ORAL | 3 refills | Status: DC

## 2020-12-13 MED ORDER — SELEXIPAG 800 MCG PO TABS: 2400.0000 ug | ORAL_TABLET | Freq: Two times a day (BID) | ORAL | 11 refills | Status: DC

## 2020-12-13 NOTE — Patient Instructions (Signed)
Increase Metolazone to 2.5 mg twice a week: EVERY WED AND SAT  Labs done today, your results will be available in MyChart, we will contact you for abnormal readings.  Your physician recommends that you return for lab work in: 10-14 days  You have been referred to [facility] for home oxygen, they will contact you to deliver the equipment  Your physician recommends that you schedule a follow-up appointment in: 1 month  If you have any questions or concerns before your next appointment please send [place] a message through [place] or call our office at [phone].    TO LEAVE A MESSAGE FOR THE NURSE SELECT OPTION 2, PLEASE LEAVE A MESSAGE INCLUDING: . YOUR NAME . DATE OF BIRTH . CALL BACK NUMBER . REASON FOR CALL**this is important as we prioritize the call backs  [place] AS LONG AS YOU CALL BEFORE 4:00 PM   At the [facility], you and your health needs are our priority. As part of our continuing mission to provide you with exceptional heart care, we have created designated Provider Care Teams. These Care Teams include your primary Cardiologist (physician) and Advanced Practice Providers (APPs- Physician Assistants and Nurse Practitioners) who all work together to provide you with the care you need, when you need it.   You may see any of the following providers on your designated Care Team at your next follow up: Marland Kitchen Dr Glori Bickers . Dr Loralie Champagne . Dr Vickki Muff . Darrick Grinder, NP . Lyda Jester, [place] . Audry Riles, PharmD   Please be sure to bring in all your medications bottles to every appointment.

## 2020-12-13 NOTE — Progress Notes (Signed)
SATURATION QUALIFICATIONS: (This note is used to comply with regulatory documentation for home oxygen)  Patient Saturations on Room Air at Rest = 90%  Patient Saturations on Room Air while Ambulating = 83%  Patient Saturations on 2 Liters of oxygen while Ambulating = 93%  Please briefly explain why patient needs home oxygen: Pulmonary hypertension, hypoxia

## 2020-12-15 NOTE — Progress Notes (Signed)
Patient ID: Tiffany [name], female   DOB: Sep 15, 1945, 75 y.o.   MRN: [id number] PCP: Dr. Sharlet Salina HF Cardiology: Dr. [name]  75 y.o. with history of paroxysmal atrial fibrillation and diastolic CHF was noted to have significant pulmonary hypertension and exertional dyspnea.  She has history of paroxysmal atrial fibrillation and had an episode in 6/16 requiring cardioversion.  She was started on amiodarone but this was stopped with worsening breathing.  Cardiolite in 9/16 showed on ischemia or infarction.     I had her do a RHC in 1/17.  This showed elevated left and right heart filling pressures but also PAH. After cath, she increased her Lasix from 40 qod to 40 daily.  At a prior appointment, I increased Lasix to 40 mg bid and also started her on Opsumit.  At next appointment, I added Adcirca 20 and then titrated it up to 40 mg daily. She continue to be volume overloaded, so  I increased Lasix to 80 qam/40 qpm. She has seemed to do well on this dose.   Echo was done in 6/17, RV mildly dilated with moderately decreased systolic function and PASP 85 mmHg.    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   She had an atrial fibrillation ablation in 6/18.   RHC in 11/18 showed lower PA pressure than in the past with PA 54/16 and PVR 3.7 WU.  Echo showed preserved LV systolic function and normal RV size and function.  Holter showed PACs and PVCs, no atrial fibrillation.  I started her on Toprol XL.  CT chest did not show any significant lung findings. Echo in 12/19 showed EF 65-70%, moderate diastolic dysfunction, mild AS, mild MR, PASP 47 mmHg, normal RV.   She was started on flecainide for paroxysmal atrial fibrillation and has not had symptomatic atrial fibrillation for months now.   Echo in 1/21 showed EF 60-65%, moderately dilated RV with normal systolic function, mild-moderate TR with PASP 88 mmHg, IVC normal.   RHC in 1/22 showed normal filling pressures but still with  severe pulmonary hypertension.  Cardiac output was preserved.  Echo in 1/22 showed EF 55-60%, moderate RV dilation with moderately decreased systolic function, moderate TR, PASP 117 mmHg with dilated IVC.    She returns today for followup of RV failure and pulmonary hypertension.  Creatinine has been slowly but steadily trending up.  She is followed by nephrology and is making plans for eventual HD.  She is not a candidate for PD catheter placement due to ventral hernias. She is still short of breath walking more than about 30 feet.  +Orthopnea.  No chest pain.  No palpitations.  Weight down 5 lbs.  Oxygen saturation drops to 83% with ambulation.   ECG (personally reviewed): NSR, 1st degree AVB, RVH, inferolateral TWIs, anteroseptal Qs  6 minute walk (2/17): 141 m 6 minute walk (3/17): 182 m 6 minute walk (5/17): 229 m 6 minute walk (10/17): 305 m 6 minute walk (5/18): 256 m 6 minute walk (8/18): 427 m 6 minute walk (11/18): 61 m, oxygen saturation dropped as low as 70s.  6 minute walk (3/19): 260 m 6 minute walk (6/19): 372 m 6 minute walk (12/19): 317 m 6 minute walk (4/21): 305 m 6 minute walk (11/21): 243 m  Labs (12/16): BNP 1187, ANA 1:640, TSH elevated. Labs (2/17): K 5.4 => 3.9, creatinine 1.78 => 1.66, LDL 112, BNP 880 Labs (4/17): K 4.5, creatinine 1.76 Labs (5/17): K  4.1, creatinine 1.79 Labs (8/17): K 3.1, creatinine 1.7, BNP 404 Labs (9/17): K 3.9, creatinine 1.8, BNP 512 Labs (2/18): K 3.5, creatinine 1.71, BNP 157 Labs (5/18): LDL 120 Labs (6/18): K 3.6, creatinine 1.85 Labs (8/18): K 3.5, creatinine 1.78, BNP 88 Labs (11/18): K 3.6 => 3.2, creatinine 1.84 => 1.7, hgb 12.7 => 11.2. Labs (3/19): K 3.8, creatinine 1.74, LDL 123, HDL 56, TSH normal Labs (6/19): K 3.5, creatinine 1.91 Labs (9/19): K 3.5, creatinine 1.9 Labs (1/21): K 3.9, creatinine 2.44 Labs (4/21): K 3.8, creatinine 2.59, BNP 268 Labs (5/21): K 3.5, creatinine 2.9 Labs (9/21): K 4.4, creatinine 2.7,  hgb 11 Labs (11/21): TSH normal Labs (12/21): hgb 10.1, K 4.2, creatinine 2.88 Labs (1/22): K 4.2, creatinine 3.22 Labs (3/22): K 4.4, creatinine 3.5 => 2.9, hgb 11.3, TSH normal Labs (4/22): K 4.6, creatinine 3.37  PMH:  1. CKD stage III 2. Bradycardia in setting of metoprolol + amiodarone use.  3. Raynauds phenomenon. 4. BPPV 5. OA right hip 6. Hyperthyroidism 7. Atrial fibrillation: Paroxysmal.  She was on amiodarone in the past but this was stopped when she became more short of breath.  TEE-guided DCCV in 6/16.  - Atrial fibrillation ablation 6/18.  - Holter (12/18) with PACs/PVCs, no atrial fibrillation.  8. HTN 9. 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 - TEE (6/16) with EF 45-50%, mildly dilated RV with mildly decreased systolic function, PASP 36 mmHg.  - RHC (1/17) with mean RA 9, PA 80/29 mean 52, mean PCWP 27, CI 1.97 Fick/1.8 thermo, PVR 7.6 Fick/8.4 thermo.  - Echo (6/17): EF 60-65%, mild MR, mild RV dilation with moderately decreased systolic function, moderate TR, PASP 85 mmHg.  - Echo (6/18): EF [id number], normal diastolic function, normal RV size and systolic function, PASP 36 mmHg. - Echo (12/18): EF 60-65%, grade II diastolic dysfunction, mild MR, normal RV size and systolic function, PASP 52 mmHg.  - RHC (11/18): mean RA 2, PA 54/16 mean 32, mean PCWP 9, CI 3.83, PVR 3.7 WU.  - Echo (12/19): EF 65-70%, moderate diastolic dysfunction, mild AS, mild MR, PASP 47 mmHg.  - Echo (1/21): EF 60-65%, moderately dilated RV with normal systolic function, mild-moderate TR with PASP 88 mmHg, IVC normal.  - Echo (1/22): EF 55-60%, moderate RV dilation with moderately decreased systolic function, moderate TR, PASP 117 mmHg with dilated IVC.   - RHC (1/22): mean RA 4, PA 87/26 mean 47, mean PCWP 9, CI 4.36 Fick, CI 3.61 thermo, PVR 5.6.  10.  Pulmonary hypertension: See RHC above.  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  - TEE (6/16) with mildly dilated RV with mildly decreased systolic function. - ANA 0000000, anti-centromere antibody elevated, anti-SCL-70 antibody negative, h/o Raynauds - PFTs (7/16) with minimal obstructive defect but moderately decreased DLCO.  - V/Q scan (2/17) negative for acute or chronic PE.  11. Cardiolite (9/16) with no ischemia/infarction.  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  13. OSA: Severe on 9/17 sleep study. Now using CPAP.  14. GERD 15. Thyroid nodule: right nodule seen on 3/19 [place].  16. Fe deficiency anemia.  17. Umbilical hernia 18. H/o partial SBO  SH: Married, lives in [place], no smoking, no ETOH.   FH: Adopted.  Daughter has Raynaud's.  ROS: All systems reviewed and negative except as per HPI  Current Outpatient Medications  Medication Sig Dispense Refill  . acetaminophen (TYLENOL) 325 MG tablet Take 2 tablets (650 mg total) by mouth every 4 (four) hours as needed for headache or mild pain.    Marland Kitchen ELIQUIS 5 MG TABS tablet TAKE 1 TABLET BY MOUTH TWICE A DAY 180 tablet 1  . famotidine (PEPCID) 20 MG tablet TAKE 1 TABLET BY MOUTH TWICE A DAY 180 tablet 3  . flecainide (TAMBOCOR) 50 MG tablet TAKE 1 TABLET BY MOUTH TWICE A DAY 180 tablet 2  . fluticasone (FLONASE) 50 MCG/ACT nasal spray Place 1 spray daily as needed into both nostrils for allergies or rhinitis.    . macitentan (OPSUMIT) 10 MG tablet Take 1 tablet (10 mg total) by mouth daily. 30 tablet 11  . Melatonin 2.5 MG CHEW Chew 2.5 mg by mouth as needed.    . metoprolol succinate (TOPROL-XL) 25 MG 24 hr tablet TAKE 1 TABLET BY MOUTH EVERY DAY 90 tablet 3  . polyethylene glycol (MIRALAX / GLYCOLAX) 17 g packet Take 17 g by mouth daily.    . potassium chloride (KLOR-CON) 10 MEQ tablet Take 20 mEq by mouth 2 (two) times daily. Extra 2 tabs w Metalazone    . Probiotic Product (PROBIOTIC-10) CAPS Take 1 capsule by mouth 3 (three)  times a week.     . Riociguat (ADEMPAS) 2.5 MG TABS Take 2.5 mg by mouth 3 (three) times daily. 90 tablet 11  . torsemide (DEMADEX) 20 MG tablet Take 4 tablets (80 mg total) by mouth every morning AND 2 tablets (40 mg total) every evening. 540 tablet 3  . [START ON 12/16/2020] metolazone (ZAROXOLYN) 2.5 MG tablet Take 1 tablet (2.5 mg total) by mouth 2 (two) times a week. On Wednesdays and Saturdays 15 tablet 3  . Selexipag 800 MCG TABS Take 3 tablets (2,400 mcg total) by mouth 2 (two) times daily. 180 tablet 11   No current facility-administered medications for this encounter.   BP (!) 90/50   Pulse 88   Wt 60 kg (132 lb 3.2 oz)   SpO2 90%   BMI 25.82 kg/m   General: NAD Neck: JVP 14 cm, no thyromegaly or thyroid nodule.  Lungs: Clear to auscultation bilaterally with normal respiratory effort. CV: Nondisplaced PMI.  Heart regular S1/S2, no S3/S4, no murmur.  1+ edema to knees.  No carotid bruit.  Normal pedal pulses.  Abdomen: Soft, nontender, no hepatosplenomegaly, no distention.  Skin: Intact without lesions or rashes.  Neurologic: Alert and oriented x 3.  Psych: Normal affect. Extremities: No clubbing or cyanosis.  HEENT: Normal.   Assessment/Plan: 1. Pulmonary hypertension: Patient has pulmonary arterial hypertension.  She appears to have co-existing diastolic LV dysfunction given elevated PCWP on prior RHC in 1/17.  PFTs did not show significant obstruction, they only showed moderately decreased DLCO consistent with pulmonary vascular disease. V/Q scan did not show evidence for chronic PE.  PVR 7.6 WU by Fick and 8.4 WU by thermodiluation on 1/17 RHC.  Cardiac index was low, 1.97 Fick/1.8 thermo. 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  Given worsening symptoms, RHC was repeated in 11/18.  This showed PVR 3.7 WU with  mean PA pressure 32 and normal CI.  RHC done in 1/22 showed normal filling pressures and cardiac output but still with severe pulmonary hypertension.   Echo in 1/22 showed normal LV systolic function but  moderately dilated and dysfunctional RV.   - Continue riociguat 2.5 mg tid.  - She has sleep apnea, now using CPAP.  - Continue Opsumit.  - She has tolerated increase in Uptravi to 2400 mcg bid.  2. Chronic diastolic CHF/RV failure: Elevated PCWP on 1/17 RHC.  NYHA class IIIb symptoms, weight down but still very volume overloaded on exam and now hypoxemic with exertion. This is complicated by CKD stage IV.  - Continue torsemide 80 qam/40 qpm.  - Increase metolazone to twice a week.  BMET today and in 10 days.   - I will arrange for home oxygen.    3. Atrial fibrillation: Paroxysmal.  She is in NSR today.  She had atrial fibrillation ablation in 6/18. She is now on flecainide.  - Continue Toprol XL 25 mg daily.  - Continue apixaban - Continue flecainide.  4. CKD: Stage IV. ?If scleroderma plays a role.  She is now seeing nephrology and planning for future HD is being done.  PD was explored, but she has extensive ventral hernias and does not seem to be a good candidate for PD catheter placement.  Surgical repair of the hernias under general anesthesia would be high risk given her severe PH.  - BMET today.  - Needs AV graft placement.  5. CREST syndrome: Seeing Dr. Amil Amen.   Followup in 1 month.   Loralie Champagne 12/15/2020

## 2020-12-16 DIAGNOSIS — D631 Anemia in chronic kidney disease: Secondary | ICD-10-CM | POA: Diagnosis not present

## 2020-12-16 DIAGNOSIS — K429 Umbilical hernia without obstruction or gangrene: Secondary | ICD-10-CM | POA: Diagnosis not present

## 2020-12-16 DIAGNOSIS — I270 Primary pulmonary hypertension: Secondary | ICD-10-CM | POA: Diagnosis not present

## 2020-12-16 DIAGNOSIS — N184 Chronic kidney disease, stage 4 (severe): Secondary | ICD-10-CM | POA: Diagnosis not present

## 2020-12-16 DIAGNOSIS — M341 CR(E)ST syndrome: Secondary | ICD-10-CM | POA: Diagnosis not present

## 2020-12-16 DIAGNOSIS — I5030 Unspecified diastolic (congestive) heart failure: Secondary | ICD-10-CM | POA: Diagnosis not present

## 2020-12-17 ENCOUNTER — Other Ambulatory Visit: Payer: Self-pay | Admitting: *Deleted

## 2020-12-17 ENCOUNTER — Encounter: Payer: Self-pay | Admitting: Internal Medicine

## 2020-12-18 ENCOUNTER — Ambulatory Visit: Payer: Medicare PPO | Admitting: Internal Medicine

## 2020-12-18 ENCOUNTER — Encounter: Payer: Self-pay | Admitting: Internal Medicine

## 2020-12-18 ENCOUNTER — Other Ambulatory Visit: Payer: Self-pay

## 2020-12-18 VITALS — BP 100/59 | HR 72 | Ht 60.0 in | Wt 130.0 lb

## 2020-12-18 DIAGNOSIS — E041 Nontoxic single thyroid nodule: Secondary | ICD-10-CM

## 2020-12-18 DIAGNOSIS — E0500 Thyrotoxicosis with diffuse goiter without thyrotoxic crisis or storm: Secondary | ICD-10-CM | POA: Diagnosis not present

## 2020-12-18 NOTE — Progress Notes (Signed)
Patient ID: Tiffany [name], female   DOB: [birth year], 75 y.o.   MRN: [id number]   This visit occurred during the SARS-CoV-2 public health emergency.  Safety protocols were in place, including screening questions prior to the visit, additional usage of staff PPE, and extensive cleaning of exam room while observing appropriate contact time as indicated for disinfecting solutions.   HPI  Tiffany [name] is a 75 y.o.-year-old female, initially referred by her cardiologist, Dr. 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  Last visit 6 months ago.  Interim history: She had an ablation for her PAF.  She was previously on amiodarone but stopped because shortness of breath.  Flecainide helps a lot. She also has pulmonary hypertension and diastolic CHF. 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 She cannot do PD b/c abdominal hernias >> will start home HD. She has anemia and has Feraheme infusions-last 09/03/2020. No heat intolerance, palpitations, but lost 10 lbs since last visit. Her appetite is decreased.  Reviewed and addended history: Patient had fluctuating TFTs since 2016, which she was found to have a slightly high TSH.  She was briefly on levothyroxine at that time.  This was stopped due to development of shortness of breath, however, in retrospect, this could have been due to undiagnosed PAH.    TSH normalized subsequently, but towards the end of 2016, the TSH returned elevated again.  Afterwards, the next TSH was normal in 10/2017 but in 04/2019 she was found to have a suppressed TSH, which was confirmed in 08/2019 along with a high free T4.  She was referred to endocrinology at that point.  Of note, she has a significant history of atrial fibrillation and had to have cardioversion in the past. She also has PAH >>  she has chronic shortness of breath.  She was on Amiodarone in 2017-2018 years ago along with metoprolol, but  amiodarone was stopped after she had a syncopal episode.  She was on Prednisone in the past for wheezing. She had significant insomnia afterwards.    We checked a thyroid uptake and scan (10/06/2019): normal: Homogeneous tracer distribution in both thyroid lobes. No focal areas of increased or decreased tracer localization. 4 hour I-123 uptake = 6.4% (normal 5-20%) 24 hour I-123 uptake = 29.4% (normal 10-30%)  IMPRESSION: Normal thyroid scan. Normal 4 hour and 24 hour radio iodine uptakes.   She also had mildly elevated TSI's >> mild Graves' disease (or, less likely, resolving subacute thyroiditis).   09/2019: started MMI 5 mg daily - did not return for repeat labs...   12/2019: stopped MMI 2/2 elevated TSH   01/2020: Restarted MMI at 2.5 mg daily 2/2 suppressed TSH   02/2020: normal TFTs - continued  the above dose   06/2020: Normal TFTs-continue the above dose   09/2020: TSH high-we stopped methimazole  Reviewed her TFTs: Lab Results  Component Value Date   TSH 2.94 10/22/2020   TSH 4.82 (H) 09/17/2020   TSH 1.820 08/06/2020   TSH 3.66 06/19/2020   TSH 2.90 02/29/2020   TSH 0.21 (L) 01/19/2020   TSH 9.61 (H) 12/14/2019   TSH 0.09 (L) 09/14/2019   TSH 0.189 (L) 08/08/2019   TSH 0.15 (L) 04/19/2019   FREET4 1.31 10/22/2020   FREET4 1.03 09/17/2020   FREET4 1.34 (H) 08/06/2020   FREET4 1.05 06/19/2020   FREET4 0.97 02/29/2020   FREET4 1.33 01/19/2020   FREET4 0.72 12/14/2019  FREET4 1.47 09/14/2019   FREET4 1.49 (H) 08/08/2019   FREET4 1.30 04/19/2019   T3FREE 2.8 10/22/2020   T3FREE 2.2 (L) 09/17/2020   T3FREE 3.1 08/06/2020   T3FREE 3.0 06/19/2020   T3FREE 2.4 02/29/2020   T3FREE 3.2 01/19/2020   T3FREE 2.7 12/14/2019   T3FREE 3.3 09/14/2019   T3FREE 3.1 08/08/2019   T3FREE 2.0 04/15/2015   Her Graves' antibodies are elevated: Lab Results  Component Value Date   TSI 161 (H) 09/14/2019   Thyroid ultrasound (08/18/2019) showed only one subcentimeter  thyroid nodule, not worrisome. Parenchymal Echotexture: Moderately heterogenous Isthmus: 0.5 cm, previously 0.3 cm Right lobe: 4.8 x 1.5 x 1.5 cm, previously 5.0 x 1.9 x 1.6 cm Left lobe: 4.1 x 2.0 x 1.3 cm, previously 4.2 x 2.5 x 1.3 cm  Again noted are numerous cysts and cystic nodules throughout the thyroid.  Nodule # 2: Prior biopsy: No Location: Right; Inferior Maximum size: 0.9 cm; Other 2 dimensions: 0.5 x 0.6 cm, previously, 1.0 x 0.6 x 0.9 cm Composition: mixed cystic and solid (1) Echogenicity: isoechoic (1) Change in features: Yes; nodule now appears to be mixed cystic and solid composition rather than a solid nodule. This nodule does NOT meet TI-RADS criteria for biopsy or dedicated follow-up. _____________________________________________________  IMPRESSION: Thyroid tissue is heterogeneous with several small thyroid nodules and cysts.  Dominant nodule is located in the inferior right thyroid lobe. This nodule has mixed cystic and solid composition and measures up to 0.9 cm. Nodule does not meet criteria for biopsy or dedicated follow-up.  Pt denies: - feeling nodules in neck - hoarseness - choking - SOB with lying down She has chronic dysphagia in the setting of strictures due to GERD - had Es dilations by Dr. Henrene Pastor in the past.  On famoitidine.  Unknown family history of thyroid disease as patient was adopted.  No h/o radiation tx to head or neck.  No herbal supplements. No Biotin use. No recent steroids use.   Pt. also has a history of GERD, sleep apnea, a hip replacement, had a nose reconstruction sx, cholecystectomy.  She was diagnosed with CREST sd. By rheumatology. She also has Raynaud's sd.   She is giving MMI to her cat, who is 27 y/o and doing well.  ROS: Constitutional: + weight loss, + fatigue, no subjective hyperthermia, + subjective hypothermia Eyes: no blurry vision, no xerophthalmia ENT: no sore throat, + see HPI, + hypoacusis -wears  hearing aids Cardiovascular: no CP/+ SOB/no palpitations/+ B leg swelling Respiratory: no cough/+ SOB/no wheezing Gastrointestinal: no N/no V/no D/no C/+ resolved acid reflux Musculoskeletal: no muscle aches/no joint aches Skin: no rashes, no hair loss Neurological: no tremors/no numbness/no tingling/no dizziness  I reviewed pt's medications, allergies, PMH, social hx, family hx, and changes were documented in the history of present illness. Otherwise, unchanged from my initial visit note.  Past Medical History:  Diagnosis Date  . Arthritis 04-20-12   Osteoarthritis-right hip  . Atrial fibrillation status post cardioversion, 01/30/15 maintaining SR.  01/31/2015   a. 01/2015 s/p TEE/DCCV;  b. 02/06/2015 recurrent AF noted, amio added;  c. CHA2DS2VASc = 3-->eliquis.  Marland Kitchen BENIGN POSITIONAL VERTIGO   . Cataract   . CHF (congestive heart failure) ([place])   . Complication of anesthesia 04-20-12   some issues with prolonged sedation after anesthesia  . Diverticulosis   . DYSLIPIDEMIA   . Esophageal stricture   . GERD   . HYPERTENSION    a. severe, pt intol of med tx,  Pt. has severe "whitecoat" syndrome and refused medical therapy.  . Hypothyroidism 09/29/2014   a. pt did not tolerate synthroid and this was subsequently discontinued.  . Mild LV dysfunction    a. 01/2015 Echo: EF 45-50%, mild MR, mildly dil LA, mod dil RV with mod to sev reduced fxn, mod-sev TR, PASP 79mHg.  . Osteopenia   . Raynaud disease   . URINARY INCONTINENCE 04-20-12   occ. with nighttime sleep pattern   Past Surgical History:  Procedure Laterality Date  . ATRIAL FIBRILLATION ABLATION N/A 01/26/2017   Procedure: Atrial Fibrillation Ablation;  Surgeon: AThompson Grayer MD;  [facility];  Service: Cardiovascular;  Laterality: N/A;  . breast ultrasound Right 09/13/13   There is no sonographic evidence of malignancy. the [id number] cyst in (R) breast is consistent with a benign finding. repeat in 1 year  .  CARDIAC CATHETERIZATION N/A 08/29/2015   Procedure: Right Heart Cath;  Surgeon: DLarey Dresser MD;  [facility];  Service: Cardiovascular;  Laterality: N/A;  . CARDIOVERSION N/A 01/30/2015   Procedure: CARDIOVERSION;  Surgeon: MSanda Klein MD;  [facility]: [facility];  Service: Cardiovascular;  Laterality: N/A;  . CATARACT EXTRACTION, BILATERAL    . CHOLECYSTECTOMY     '90-"sludge"  . NASAL FRACTURE SURGERY  2006  . RIGHT HEART CATH N/A 06/30/2017   Procedure: RIGHT HEART CATH;  Surgeon: MLarey Dresser MD;  [facility];  Service: Cardiovascular;  Laterality: N/A;  . RIGHT HEART CATH N/A 08/09/2020   Procedure: RIGHT HEART CATH;  Surgeon: MLarey Dresser MD;  [facility];  Service: Cardiovascular;  Laterality: N/A;  . TEE WITHOUT CARDIOVERSION N/A 01/30/2015   Procedure: TRANSESOPHAGEAL ECHOCARDIOGRAM (TEE);  Surgeon: MSanda Klein MD;  [facility];  Service: Cardiovascular;  Laterality: N/A;  . TEE WITHOUT CARDIOVERSION N/A 01/25/2017   Procedure: TRANSESOPHAGEAL ECHOCARDIOGRAM (TEE);  Surgeon: RFay Records MD;  [facility];  Service: Cardiovascular;  Laterality: N/A;  . TOTAL HIP ARTHROPLASTY  04/26/2012   Procedure: TOTAL HIP ARTHROPLASTY ANTERIOR APPROACH;  Surgeon: MMauri Pole MD;  Location: [facility];  Service: Orthopedics;  Laterality: Right;  . TUBAL LIGATION  1980   Social History   Socioeconomic History  . Marital status: Married    Spouse name: Not on file  . Number of children: 2  . Years of education: Not on file  . Highest education level: Not on file  Occupational History  . Not on file  Tobacco Use  . Smoking status: Never Smoker  . Smokeless tobacco: Never Used  Vaping Use  . Vaping Use: Never used  Substance and Sexual Activity  . Alcohol use: Yes    Comment: less than 1 drink per week  . Drug use: No  . Sexual activity: Yes  Other Topics Concern  . Not on file  Social History  Narrative   She enjoys birding.  Lives at home with husband.   Married.   retired lQuarry manager pTax inspector  Social Determinants of HRadio broadcast assistantStrain: Not on fComcastInsecurity: Not on file  Transportation Needs: Not on file  Physical Activity: Not on file  Stress: Not on file  Social Connections: Not on file  Intimate Partner Violence: Not on file   Current Outpatient Medications on File Prior to Visit  Medication Sig Dispense Refill  . acetaminophen (TYLENOL) 325 MG tablet Take 2 tablets (  650 mg total) by mouth every 4 (four) hours as needed for headache or mild pain.    Marland Kitchen ELIQUIS 5 MG TABS tablet TAKE 1 TABLET BY MOUTH TWICE A DAY 180 tablet 1  . famotidine (PEPCID) 20 MG tablet TAKE 1 TABLET BY MOUTH TWICE A DAY 180 tablet 3  . flecainide (TAMBOCOR) 50 MG tablet TAKE 1 TABLET BY MOUTH TWICE A DAY 180 tablet 2  . fluticasone (FLONASE) 50 MCG/ACT nasal spray Place 1 spray daily as needed into both nostrils for allergies or rhinitis.    . macitentan (OPSUMIT) 10 MG tablet Take 1 tablet (10 mg total) by mouth daily. 30 tablet 11  . Melatonin 2.5 MG CHEW Chew 2.5 mg by mouth as needed.    . metolazone (ZAROXOLYN) 2.5 MG tablet Take 1 tablet (2.5 mg total) by mouth 2 (two) times a week. On Wednesdays and Saturdays 15 tablet 3  . metoprolol succinate (TOPROL-XL) 25 MG 24 hr tablet TAKE 1 TABLET BY MOUTH EVERY DAY 90 tablet 3  . polyethylene glycol (MIRALAX / GLYCOLAX) 17 g packet Take 17 g by mouth daily.    . potassium chloride (KLOR-CON) 10 MEQ tablet Take 20 mEq by mouth 2 (two) times daily. Extra 2 tabs w Metalazone    . Probiotic Product (PROBIOTIC-10) CAPS Take 1 capsule by mouth 3 (three) times a week.     . Riociguat (ADEMPAS) 2.5 MG TABS Take 2.5 mg by mouth 3 (three) times daily. 90 tablet 11  . Selexipag 800 MCG TABS Take 3 tablets (2,400 mcg total) by mouth 2 (two) times daily. 180 tablet 11  . torsemide (DEMADEX) 20 MG tablet Take 4  tablets (80 mg total) by mouth every morning AND 2 tablets (40 mg total) every evening. 540 tablet 3   No current facility-administered medications on file prior to visit.   Allergies  Allergen Reactions  . Levothyroxine Shortness Of Breath and Other (See Comments)    Exhaustion also - Per pt her this medication could have not been the reason for her symptoms  . Statins Other (See Comments)    "Pain, weakness and kidney problems"  . Prednisone Other (See Comments)    Severe insomnia  . Penicillins Other (See Comments)    dry mouth Has patient had a PCN reaction causing immediate rash, facial/tongue/throat swelling, SOB or lightheadedness with hypotension: No Has patient had a PCN reaction causing severe rash involving mucus membranes or skin necrosis: No Has patient had a PCN reaction that required hospitalization No Has patient had a PCN reaction occurring within the last 10 years: No If all of the above answers are "NO", then may proceed with Cephalosporin use.    Family History  Adopted: Yes  Problem Relation Age of Onset  . Other Other        patient was adopted    PE: BP (!) 100/59 (BP Location: Right Arm, Patient Position: Sitting, Cuff Size: Normal)   Pulse 72   Ht 5' (1.524 m)   Wt 130 lb (59 kg)   SpO2 (!) 78% - RAYNAUD's SD.  BMI 25.39 kg/m  Wt Readings from Last 3 Encounters:  12/18/20 130 lb (59 kg)  12/13/20 132 lb 3.2 oz (60 kg)  12/10/20 132 lb (59.9 kg)   Constitutional: normal weight, in NAD Eyes: PERRLA, EOMI, no exophthalmos ENT: moist mucous membranes, + slight, symmetric, rubbery, thyromegaly, no cervical lymphadenopathy Cardiovascular: RRR, No MRG, + bilateral LE pitting edema -wears compression hoses Respiratory: CTA B Gastrointestinal:  abdomen soft, NT, ND, BS+ Musculoskeletal: no deformities, strength intact in all 4 Skin: moist, warm, no rashes Neurological: no tremor with outstretched hands, DTR normal in all 4  ASSESSMENT: 1.  Graves'  disease  2. Thyroid nodule  PLAN:  1.  Graves' disease -Patient with a history of hypothyroidism, but with more recent development of thyrotoxicosis.  Her TSI's were mildly positive, pointing towards a possible diagnosis of mild Graves' disease, but these antibodies may be slightly elevated in the setting of subacute thyroiditis.  In her case, based on clinical course, I suspected mild Graves' disease.  Thyroid uptake and scan was normal, consistent with mild Graves' disease, and, less likely, resolved thyroiditis.  Of note, she did not have thyrotoxic symptoms: No weight loss, heat intolerance, hyper defecation, anxiety, but had palpitations, which were chronic for her, in the setting of atrial fibrillation.  This is controlled with metoprolol and flecainide per cardiology. -Restarted methimazole 5 mg daily 09/2019.  She did not return in 1.5 months for repeat labs and will we actually repeated the labs, the TSH was elevated.  At that time, we stopped methimazole, however, TSH was slightly low in 01/2020 so we restarted methimazole at 2.5 mg daily. On this dose, TFTs  were normal at last visit in 06/2020.  However, TSH was high in 09/2020 so we stopped methimazole then.  Latest TSH was checked in 10/2020 and this was normal. -At today's visit, she denies thyrotoxic symptoms but she did have a 10 pound weight loss, which she attributes to decreased appetite.  No palpitations, no tremors, no heat intolerance.  She is in normal sinus rhythm today on auscultation. -We discussed about other treatments for Graves' disease to include RAI treatment, or, last resort, surgery.  For now, we decided to restart methimazole if TFTs are abnormal today. -She is on Toprol-XL and flecainide per cardiology. -No signs of Graves' ophthalmopathy: No double vision, blurry vision, eye pain, chemosis - RTC in 6 months  2.  Thyroid nodule -She had a small, subcentimeter, hypoechoic, mixed (solid/cystic) nodule, seen on the  thyroid ultrasound from 08/2019. -She has no neck compression symptoms -This nodule is not felt on palpation today -No intervention is needed for this but will need to recheck thyroid ultrasound if she starts developing neck compression symptoms in the future.  Component     Latest Ref Rng & Units 12/18/2020  TSH     0.35 - 4.50 uIU/mL 1.32  T4,Free(Direct)     0.60 - 1.60 ng/dL 1.11  Triiodothyronine,Free,Serum     2.3 - 4.2 pg/mL 2.3  Normal TFTs.  Philemon Kingdom, MD PhD [facility] Endocrinology

## 2020-12-18 NOTE — Patient Instructions (Addendum)
Please continue off Methimazole.  Please stop at the lab.  Please come back for a follow-up appointment in 6 months.

## 2020-12-19 ENCOUNTER — Encounter (HOSPITAL_COMMUNITY): Payer: Self-pay | Admitting: Vascular Surgery

## 2020-12-19 ENCOUNTER — Telehealth (HOSPITAL_COMMUNITY): Payer: Self-pay | Admitting: Pharmacy Technician

## 2020-12-19 LAB — TSH: TSH: 1.32 u[IU]/mL (ref 0.35–4.50)

## 2020-12-19 LAB — T3, FREE: T3, Free: 2.3 pg/mL (ref 2.3–4.2)

## 2020-12-19 LAB — T4, FREE: Free T4: 1.11 ng/dL (ref 0.60–1.60)

## 2020-12-19 NOTE — Progress Notes (Signed)
EKG: 12/13/20 CXR: 09/01/18 ECHO: 08/27/20 Stress Test: denies Cardiac Cath: 08/09/20  Fasting Blood Sugar- na Checks Blood Sugar__na_ times a day  OSA/CPAP: Yes, wears nightly  ASA: No Blood Thinners: Yes, last dose Eliquis 12/19/20  Covid test: na  Anesthesia Review: Yes, cardiac history, afib, severe pulmonary HTN.    Patient denies shortness of breath, fever, cough, and chest pain at PAT appointment.  Patient verbalized understanding of instructions provided today at the PAT appointment.  Patient asked to review instructions at home and day of surgery.

## 2020-12-19 NOTE — Anesthesia Preprocedure Evaluation (Addendum)
Anesthesia Evaluation  Patient identified by MRN, date of birth, ID band Patient awake    Reviewed: Allergy & Precautions, NPO status , Patient's Chart, lab work & pertinent test results  Airway Mallampati: II  TM Distance: >3 FB Neck ROM: Full    Dental  (+) Teeth Intact, Dental Advisory Given   Pulmonary sleep apnea ,    breath sounds clear to auscultation       Cardiovascular hypertension, Pt. on home beta blockers +CHF  + dysrhythmias Atrial Fibrillation  Rhythm:Regular Rate:Normal  Echo 08/27/20: IMPRESSIONS  1. Left ventricular ejection fraction, by estimation, is 55 to 60%. The  left ventricle has normal function. The left ventricle has no regional  wall motion abnormalities. Left ventricular diastolic parameters are  consistent with Grade II diastolic  dysfunction (pseudonormalization).  2. Right ventricular systolic function is moderately reduced. The right  ventricular size is moderately enlarged. There is severely elevated  pulmonary artery systolic pressure. The estimated right ventricular  systolic pressure is 528.4 mmHg. D-shaped  interventricular septum suggestive of RV pressure/volume overload.  3. Left atrial size was mildly dilated.  4. Right atrial size was moderately dilated.  5. The mitral valve is normal in structure. Trivial mitral valve  regurgitation. No evidence of mitral stenosis.  6. Tricuspid valve regurgitation is moderate.  7. The aortic valve is tricuspid. Aortic valve regurgitation is not  visualized. Mild aortic valve sclerosis is present, with no evidence of  aortic valve stenosis.  8. The inferior vena cava is dilated in size with <50% respiratory  variability, suggesting right atrial pressure of 15 mmHg.    [place] 08/09/20: Hemodynamicsm (mmHg) RA mean 4 RV 86/9 PA 87/26, mean 47 PCWP mean 9  Oxygen saturations: PA 75% AO 97%  Cardiac Output (Fick) 6.78  Cardiac Index (Fick)  4.36 PVR 5.6 WU   Cardiac Output (Thermo) 5.61  Cardiac Index (Thermo) 3.61 PVR 6.8 WU  1. Optimized filling pressures.  2. Severe pulmonary arterial hypertension.     Neuro/Psych PSYCHIATRIC DISORDERS Anxiety negative neurological ROS     GI/Hepatic Neg liver ROS, GERD  Medicated,  Endo/Other  Hypothyroidism   Renal/GU ESRF and DialysisRenal disease     Musculoskeletal  (+) Arthritis ,   Abdominal Normal abdominal exam  (+)   Peds  Hematology   Anesthesia Other Findings   Reproductive/Obstetrics                           Anesthesia Physical Anesthesia Plan  ASA: III  Anesthesia Plan: MAC   Post-op Pain Management:    Induction: Intravenous  PONV Risk Score and Plan: 0 and Propofol infusion  Airway Management Planned: Natural Airway and Simple Face Mask  Additional Equipment: None  Intra-op Plan:   Post-operative Plan:   Informed Consent:   Plan Discussed with: CRNA  Anesthesia Plan Comments: (PAT note written 12/19/2020 by Myra Gianotti, PA-C. She has severe PAH. HF cardiologist is Dr. [name] with recent evaluation and aware of need for [place]. )      Anesthesia Quick Evaluation

## 2020-12-19 NOTE — Telephone Encounter (Signed)
Patient Advocate Encounter   Received notification from [facility] that prior authorization for Tiffany [name] is required.   PA submitted on CoverMyMeds Key [id number] Status is pending   Will continue to follow.

## 2020-12-19 NOTE — Progress Notes (Signed)
Anesthesia Chart Review:  Case: [id number] Date/Time: 12/23/20 0946   Procedure: INSERTION OF ARTERIOVENOUS (AV) GORE-TEX GRAFT ARM LEFT VS FISTULA (Left )   Anesthesia type: Choice   Pre-op diagnosis: ESRD   Location: [facility] OR ROOM 16 / [place] OR   Surgeons: [name], MD      DISCUSSION: Patient is a 75 year old female scheduled for the above procedure.  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 Cruzita Lederer), esophageal stricture, afib/PAF (s/p DCCV 02/06/15, s/p afib ablation 01/26/17), chronic diastolic CHF with prominent RV failure, pulmonary arterial hypertension, dyspnea, CKD, anemia, OSA (CPAP), BPPV. GI Dr. 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 History of "prolonged sedation" after anesthesia.   Last evaluation by cardiologist Dr. [name] on 12/13/20. for followup of RV failure and pulmonary hypertension. 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 She in on Opsumit, Selexipag, and Adempas. Has OSA as well, and now using CPAP. 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 He continued torsemide and increased metolazone to 2x/week. He also arranged home O2 at 2L.  He notes she is making plans to start hemodialysis, as she was not felt to be a good candidate for PD catheter placement due to ventral hernia. (Note suggests wanting to avoid ventral hernia repair since she would be high risk for general anesthesia due to severe PAH.) He notes she needs AVGG placement.   She was last evaluated by endocrinologist Dr. Cruzita Lederer on 12/18/20 for follow-up thyrotoxicosis (mild Graves' disease versus resolving thyroiditis), thought likely mild Graves' disease. Thyroid uptake and scan was normal. She has been on and off methimazole depending on labs. At  recent visit, no thyrotoxic symptoms but 10 lb weight loss attributed to decreased appetite. She discussed other treatments for Graves' disease including RAI treatment, or as a last resort surgery. Decision to recheck labs and restart methimazole if abnormal. 12/18/20 labs showed: Free T3 low normal at 2.3, Free T4 normal at 1.11, TSH normal at 1.32.    Last Eliquis documented as 12/19/20. Anesthesia team to evaluate on the day of surgery.   VS:  BP Readings from Last 3 Encounters:  12/18/20 (!) 100/59  12/13/20 (!) 90/50  12/10/20 (!) 98/57   Pulse Readings from Last 3 Encounters:  12/18/20 72  12/13/20 88  12/10/20 71    PROVIDERS: [name], MD is PCP  Loralie Champagne, MD is HF/pulmonary HTN cardiologist Shelva Majestic, MD is cardiologist for OSA Philemon Kingdom, MD is endocrinologist Leigh Aurora, MD is rheumatologist (previously saw Hurley Cisco, MD in 2017)  Pearson Grippe, MD is nephrologist Scarlette Shorts, MD is GI   LABS: For ISTAT day of surgery. As of 12/13/20, BUN 76, Cr 4.00, H/H 11.0/35.3  OTHER: O2 SATURATION QUALIFICATIONS:  Patient Saturations on Room Air at Rest = 90% Patient Saturations on Room Air while Ambulating = 83% Patient Saturations on 2 Liters of oxygen while Ambulating = 93% Please briefly explain why patient needs home oxygen: Pulmonary hypertension, hypoxia  PFTs > 5 years ago.   IMAGES: MR Abdomen 12/04/20: IMPRESSION: 1. Cystic lesion in the pancreatic head measuring 2.0 by 1.7 by 2.8 cm, with internal septations/lobulation. This lesion was not present on the 08/08/2015 CT abdomen. Possibilities include low risk intraductal papillary mucinous neoplasm, serous cystadenoma, or postinflammatory cystic lesion. Possibilities for  further workup given the lesion size include endoscopic ultrasound with fine-needle aspiration, or follow up surveillance MRI in 6 months time. This recommendation follows ACR consensus guidelines:  Management of Incidental Pancreatic Cysts: A White Paper of the ACR Incidental Findings Committee. [place] [id number]. 2. Pancreas divisum.  No dorsal pancreatic duct prominence. 3. Prominent right hilum likely due to a combination of prominent right pulmonary artery and possible right hilar adenopathy. This could be investigated with chest CT if clinically warranted. 4. Third spacing of fluid with mild upper abdominal ascites along with mesenteric and subcutaneous edema. 5. Mild hepatic congestion likely from right heart failure. Correlate with cardiac history. 6.  Aortic Atherosclerosis (ICD10-I70.0). 7. Equivocal wall thickening in the gastric cardia, significance uncertain. 8. Umbilical hernia contains fluid and adipose tissue. 9. Mild thoracolumbar scoliosis.   EKG: 12/13/20: Sinus rhythm with 1st degree A-V block Right axis deviation Incomplete right bundle branch block Right ventricular hypertrophy Septal infarct , age undetermined ST & T wave abnormality, consider inferior ischemia ST & T wave abnormality, consider anterolateral ischemia Abnormal ECG No significant change was found Confirmed by Virl Axe [phone]) on 12/14/2020 1:02:58 PM   CV: Echo 08/27/20: IMPRESSIONS  1. Left ventricular ejection fraction, by estimation, is 55 to 60%. The  left ventricle has normal function. The left ventricle has no regional  wall motion abnormalities. Left ventricular diastolic parameters are  consistent with Grade II diastolic  dysfunction (pseudonormalization).  2. Right ventricular systolic function is moderately reduced. The right  ventricular size is moderately enlarged. There is severely elevated  pulmonary artery systolic pressure. The estimated right ventricular  systolic pressure is [id number] mmHg. D-shaped  interventricular septum suggestive of RV pressure/volume overload.  3. Left atrial size was mildly dilated.  4. Right atrial size was moderately  dilated.  5. The mitral valve is normal in structure. Trivial mitral valve  regurgitation. No evidence of mitral stenosis.  6. Tricuspid valve regurgitation is moderate.  7. The aortic valve is tricuspid. Aortic valve regurgitation is not  visualized. Mild aortic valve sclerosis is present, with no evidence of  aortic valve stenosis.  8. The inferior vena cava is dilated in size with <50% respiratory  variability, suggesting right atrial pressure of 15 mmHg.    [place] 08/09/20: Hemodynamicsm (mmHg) RA mean 4 RV 86/9 PA 87/26, mean 47 PCWP mean 9  Oxygen saturations: PA 75% AO 97%  Cardiac Output (Fick) 6.78  Cardiac Index (Fick) 4.36 PVR 5.6 WU   Cardiac Output (Thermo) 5.61  Cardiac Index (Thermo) 3.61 PVR 6.8 WU  1. Optimized filling pressures.  2. Severe pulmonary arterial hypertension.   She can take metolazone based on weight.  If weight goes up >3 lbs by next Wednesday, take a dose of 2.5 metolazone with am torsemide.  If not, do not take metolazone.  Will make it once weekly prn.   27 hour Holter Monitor 07/06/17: Study Highlights 1. Primarily NSR.  2. 3% PVCs.  3. 3% PACs, supraventricular runs (short atrial tachycardia runs most likely).  4. No atrial fibrillation noted.    EP Study/Ablation 01/26/17: PROCEDURES: 1. Comprehensive electrophysiologic study. 2. Coronary sinus pacing and recording. 3. Three-dimensional mapping of atrial fibrillation  4. Ablation of atrial fibrillation  5. Intracardiac echocardiography. 6. Transseptal puncture of an intact septum. 7. Arrhythmia induction with pacing with isuprel infusion   Nuclear Stress Test 04/17/15: IMPRESSION: 1. No reversible ischemia or infarction. 2. Normal left ventricular wall motion. 3. Left ventricular ejection fraction 90%  4. Low-risk stress test findings*.   Carotid US 04/15/15: Summary:  Bilateral - 1% to 39% ICA stenosis. Vertebral atery flow is  antegrade.     Past Medical History:   Diagnosis Date  . Anemia   . Arthritis 04-20-12   Osteoarthritis-right hip  . Atrial fibrillation status post cardioversion, 01/30/15 maintaining SR.  01/31/2015   a. 01/2015 s/p TEE/DCCV;  b. 02/06/2015 recurrent AF noted, amio added;  c. CHA2DS2VASc = 3-->eliquis.  Marland Kitchen BENIGN POSITIONAL VERTIGO   . Cataract   . CHF (congestive heart failure) ([place])   . Chronic kidney disease   . Complication of anesthesia 04-20-12   some issues with prolonged sedation after anesthesia  . Diverticulosis   . DYSLIPIDEMIA   . Dyspnea   . Dysrhythmia   . Esophageal stricture   . GERD   . HYPERTENSION    a. severe, pt intol of med tx, Pt. has severe "whitecoat" syndrome and refused medical therapy.  . Hypothyroidism 09/29/2014   a. pt did not tolerate synthroid and this was subsequently discontinued.  . Mild LV dysfunction    a. 01/2015 Echo: EF 45-50%, mild MR, mildly dil LA, mod dil RV with mod to sev reduced fxn, mod-sev TR, PASP 67mHg.  . Osteopenia   . Raynaud disease   . Sleep apnea   . URINARY INCONTINENCE 04-20-12   occ. with nighttime sleep pattern    Past Surgical History:  Procedure Laterality Date  . ATRIAL FIBRILLATION ABLATION N/A 01/26/2017   Procedure: Atrial Fibrillation Ablation;  Surgeon: AThompson Grayer MD;  [facility];  Service: Cardiovascular;  Laterality: N/A;  . breast ultrasound Right 09/13/13   There is no sonographic evidence of malignancy. the [id number] cyst in (R) breast is consistent with a benign finding. repeat in 1 year  . CARDIAC CATHETERIZATION N/A 08/29/2015   Procedure: Right Heart Cath;  Surgeon: DLarey Dresser MD;  [facility];  Service: Cardiovascular;  Laterality: N/A;  . CARDIOVERSION N/A 01/30/2015   Procedure: CARDIOVERSION;  Surgeon: MSanda Klein MD;  [facility];  Service: Cardiovascular;  Laterality: N/A;  . CATARACT EXTRACTION, BILATERAL    . CHOLECYSTECTOMY     '90-"sludge"  . NASAL FRACTURE SURGERY   2006  . RIGHT HEART CATH N/A 06/30/2017   Procedure: RIGHT HEART CATH;  Surgeon: MLarey Dresser MD;  [facility];  Service: Cardiovascular;  Laterality: N/A;  . RIGHT HEART CATH N/A 08/09/2020   Procedure: RIGHT HEART CATH;  Surgeon: MLarey Dresser MD;  [facility];  Service: Cardiovascular;  Laterality: N/A;  . TEE WITHOUT CARDIOVERSION N/A 01/30/2015   Procedure: TRANSESOPHAGEAL ECHOCARDIOGRAM (TEE);  Surgeon: MSanda Klein MD;  [facility];  Service: Cardiovascular;  Laterality: N/A;  . TEE WITHOUT CARDIOVERSION N/A 01/25/2017   Procedure: TRANSESOPHAGEAL ECHOCARDIOGRAM (TEE);  Surgeon: RFay Records MD;  [facility];  Service: Cardiovascular;  Laterality: N/A;  . TOTAL HIP ARTHROPLASTY  04/26/2012   Procedure: TOTAL HIP ARTHROPLASTY ANTERIOR APPROACH;  Surgeon: MMauri Pole MD;  Location: [facility] ORS;  Service: Orthopedics;  Laterality: Right;  . TUBAL LIGATION  1980    MEDICATIONS: No current facility-administered medications for this encounter.   .Marland Kitchenacetaminophen (TYLENOL) 325 MG tablet  . ELIQUIS 5 MG TABS tablet  . famotidine (PEPCID) 20 MG tablet  . flecainide (TAMBOCOR) 50 MG tablet  . fluticasone (FLONASE) 50 MCG/ACT nasal spray  . macitentan (OPSUMIT) 10 MG tablet  .  metolazone (ZAROXOLYN) 2.5 MG tablet  . metoprolol succinate (TOPROL-XL) 25 MG 24 hr tablet  . potassium chloride (KLOR-CON) 10 MEQ tablet  . Probiotic Product (PROBIOTIC-10) CAPS  . Riociguat (ADEMPAS) 2.5 MG TABS  . Selexipag 800 MCG TABS  . torsemide (DEMADEX) 20 MG tablet    Myra Gianotti, PA-C Surgical Short Stay/Anesthesiology [facility] Phone [phone] [facility] Phone [phone] 12/19/2020 5:24 PM

## 2020-12-20 ENCOUNTER — Other Ambulatory Visit: Payer: Self-pay

## 2020-12-20 ENCOUNTER — Ambulatory Visit (HOSPITAL_COMMUNITY)
Admission: RE | Admit: 2020-12-20 | Discharge: 2020-12-20 | Disposition: A | Discharge status: Home / Self Care | Payer: Medicare PPO | Source: Ambulatory Visit | Attending: Cardiology | Admitting: Cardiology

## 2020-12-20 DIAGNOSIS — I5032 Chronic diastolic (congestive) heart failure: Secondary | ICD-10-CM | POA: Diagnosis not present

## 2020-12-20 DIAGNOSIS — I272 Pulmonary hypertension, unspecified: Secondary | ICD-10-CM | POA: Insufficient documentation

## 2020-12-20 LAB — BASIC METABOLIC PANEL
Anion gap: 14 (ref 5–15)
BUN: 85 mg/dL — ABNORMAL HIGH (ref 8–23)
CO2: 23 mmol/L (ref 22–32)
Calcium: 9 mg/dL (ref 8.9–10.3)
Chloride: 98 mmol/L (ref 98–111)
Creatinine, Ser: 4.75 mg/dL — ABNORMAL HIGH (ref 0.44–1.00)
GFR, Estimated: 9 mL/min — ABNORMAL LOW (ref >60)
Glucose, Bld: 103 mg/dL — ABNORMAL HIGH (ref 70–99)
Potassium: 3.5 mmol/L (ref 3.5–5.1)
Sodium: 135 mmol/L (ref 135–145)

## 2020-12-23 ENCOUNTER — Telehealth (HOSPITAL_COMMUNITY): Payer: Self-pay | Admitting: *Deleted

## 2020-12-23 ENCOUNTER — Other Ambulatory Visit (HOSPITAL_COMMUNITY): Payer: Medicare PPO

## 2020-12-23 ENCOUNTER — Encounter (HOSPITAL_COMMUNITY)
Admission: RE | Disposition: A | Discharge status: Home / Self Care | Payer: Self-pay | Source: Home / Self Care | Attending: Vascular Surgery

## 2020-12-23 ENCOUNTER — Encounter (HOSPITAL_COMMUNITY): Payer: Self-pay | Admitting: Vascular Surgery

## 2020-12-23 ENCOUNTER — Ambulatory Visit (HOSPITAL_COMMUNITY)
Admission: RE | Admit: 2020-12-23 | Discharge: 2020-12-23 | Disposition: A | Discharge status: Home / Self Care | Payer: Medicare PPO | Attending: Vascular Surgery | Admitting: Vascular Surgery

## 2020-12-23 ENCOUNTER — Other Ambulatory Visit: Payer: Self-pay

## 2020-12-23 ENCOUNTER — Ambulatory Visit (HOSPITAL_COMMUNITY): Payer: Medicare PPO | Admitting: Vascular Surgery

## 2020-12-23 ENCOUNTER — Other Ambulatory Visit (HOSPITAL_COMMUNITY): Payer: Self-pay | Admitting: *Deleted

## 2020-12-23 DIAGNOSIS — E785 Hyperlipidemia, unspecified: Secondary | ICD-10-CM | POA: Diagnosis not present

## 2020-12-23 DIAGNOSIS — Z79899 Other long term (current) drug therapy: Secondary | ICD-10-CM | POA: Diagnosis not present

## 2020-12-23 DIAGNOSIS — M341 CR(E)ST syndrome: Secondary | ICD-10-CM | POA: Diagnosis not present

## 2020-12-23 DIAGNOSIS — I4891 Unspecified atrial fibrillation: Secondary | ICD-10-CM | POA: Insufficient documentation

## 2020-12-23 DIAGNOSIS — N186 End stage renal disease: Secondary | ICD-10-CM | POA: Diagnosis not present

## 2020-12-23 DIAGNOSIS — I132 Hypertensive heart and chronic kidney disease with heart failure and with stage 5 chronic kidney disease, or end stage renal disease: Secondary | ICD-10-CM | POA: Insufficient documentation

## 2020-12-23 DIAGNOSIS — Z7901 Long term (current) use of anticoagulants: Secondary | ICD-10-CM | POA: Insufficient documentation

## 2020-12-23 DIAGNOSIS — Z992 Dependence on renal dialysis: Secondary | ICD-10-CM | POA: Diagnosis not present

## 2020-12-23 DIAGNOSIS — I5032 Chronic diastolic (congestive) heart failure: Secondary | ICD-10-CM | POA: Diagnosis not present

## 2020-12-23 DIAGNOSIS — N185 Chronic kidney disease, stage 5: Secondary | ICD-10-CM | POA: Diagnosis not present

## 2020-12-23 DIAGNOSIS — I2721 Secondary pulmonary arterial hypertension: Secondary | ICD-10-CM | POA: Diagnosis not present

## 2020-12-23 DIAGNOSIS — Z880 Allergy status to penicillin: Secondary | ICD-10-CM | POA: Insufficient documentation

## 2020-12-23 DIAGNOSIS — Z888 Allergy status to other drugs, medicaments and biological substances status: Secondary | ICD-10-CM | POA: Insufficient documentation

## 2020-12-23 HISTORY — DX: Chronic kidney disease, unspecified: N18.9

## 2020-12-23 HISTORY — DX: Dyspnea, unspecified: R06.00

## 2020-12-23 HISTORY — DX: Anemia, unspecified: D64.9

## 2020-12-23 HISTORY — DX: Cardiac arrhythmia, unspecified: I49.9

## 2020-12-23 HISTORY — PX: BASCILIC VEIN TRANSPOSITION: SHX5742

## 2020-12-23 HISTORY — DX: Pulmonary hypertension, unspecified: I27.20

## 2020-12-23 HISTORY — DX: Sleep apnea, unspecified: G47.30

## 2020-12-23 LAB — POCT I-STAT, CHEM 8
BUN: 89 mg/dL — ABNORMAL HIGH (ref 8–23)
Calcium, Ion: 0.83 mmol/L — CL (ref 1.15–1.40)
Chloride: 106 mmol/L (ref 98–111)
Creatinine, Ser: 5 mg/dL — ABNORMAL HIGH (ref 0.44–1.00)
Glucose, Bld: 94 mg/dL (ref 70–99)
HCT: 34 % — ABNORMAL LOW (ref 36.0–46.0)
Hemoglobin: 11.6 g/dL — ABNORMAL LOW (ref 12.0–15.0)
Potassium: 3.4 mmol/L — ABNORMAL LOW (ref 3.5–5.1)
Sodium: 137 mmol/L (ref 135–145)
TCO2: 16 mmol/L — ABNORMAL LOW (ref 22–32)

## 2020-12-23 SURGERY — TRANSPOSITION, VEIN, BASILIC
Anesthesia: Monitor Anesthesia Care | Site: Arm Upper | Laterality: Left

## 2020-12-23 MED ORDER — ACETAMINOPHEN 10 MG/ML IV SOLN: 1000.0000 mg | Freq: Once | INTRAVENOUS | Status: DC | PRN

## 2020-12-23 MED ORDER — EPHEDRINE SULFATE-NACL 50-0.9 MG/10ML-% IV SOSY
PREFILLED_SYRINGE | INTRAVENOUS | Status: DC | PRN
  Administered 2020-12-23 (×2): 10 mg via INTRAVENOUS

## 2020-12-23 MED ORDER — 0.9 % SODIUM CHLORIDE (POUR BTL) OPTIME
TOPICAL | Status: DC | PRN
  Administered 2020-12-23: 1000 mL

## 2020-12-23 MED ORDER — ACETAMINOPHEN 325 MG PO TABS
325.0000 mg | ORAL_TABLET | ORAL | Status: DC | PRN
Start: 2020-12-23 — End: 2020-12-23

## 2020-12-23 MED ORDER — CHLORHEXIDINE GLUCONATE 0.12 % MT SOLN
15.0000 mL | Freq: Once | OROMUCOSAL | Status: AC
  Administered 2020-12-23: 15 mL via OROMUCOSAL
  Filled 2020-12-23: qty 15

## 2020-12-23 MED ORDER — PROPOFOL 500 MG/50ML IV EMUL
INTRAVENOUS | Status: DC | PRN
  Administered 2020-12-23: 30 ug/kg/min via INTRAVENOUS

## 2020-12-23 MED ORDER — VANCOMYCIN HCL IN DEXTROSE 1-5 GM/200ML-% IV SOLN
1000.0000 mg | INTRAVENOUS | Status: AC
  Administered 2020-12-23: 1000 mg via INTRAVENOUS
  Filled 2020-12-23: qty 200

## 2020-12-23 MED ORDER — OXYCODONE HCL 5 MG/5ML PO SOLN: 5.0000 mg | Freq: Once | ORAL | Status: DC | PRN

## 2020-12-23 MED ORDER — MIDAZOLAM HCL 2 MG/2ML IJ SOLN
INTRAMUSCULAR | Status: DC | PRN
  Administered 2020-12-23: 1 mg via INTRAVENOUS

## 2020-12-23 MED ORDER — UPTRAVI 1200 MCG PO TABS: 2400.0000 ug | ORAL_TABLET | Freq: Two times a day (BID) | ORAL | 11 refills | Status: DC

## 2020-12-23 MED ORDER — MIDAZOLAM HCL 2 MG/2ML IJ SOLN
INTRAMUSCULAR | Status: AC
  Filled 2020-12-23: qty 2

## 2020-12-23 MED ORDER — LACTATED RINGERS IV SOLN: INTRAVENOUS | Status: DC

## 2020-12-23 MED ORDER — PROMETHAZINE HCL 25 MG/ML IJ SOLN: 6.2500 mg | INTRAMUSCULAR | Status: DC | PRN

## 2020-12-23 MED ORDER — LIDOCAINE HCL 1 % IJ SOLN
INTRAMUSCULAR | Status: DC | PRN
  Administered 2020-12-23: 9 mL via INTRADERMAL

## 2020-12-23 MED ORDER — CHLORHEXIDINE GLUCONATE 4 % EX LIQD: 60.0000 mL | Freq: Once | CUTANEOUS | Status: DC

## 2020-12-23 MED ORDER — HEPARIN SODIUM (PORCINE) 1000 UNIT/ML IJ SOLN
INTRAMUSCULAR | Status: DC | PRN
  Administered 2020-12-23: 3000 [IU] via INTRAVENOUS

## 2020-12-23 MED ORDER — PHENYLEPHRINE HCL-NACL 10-0.9 MG/250ML-% IV SOLN
INTRAVENOUS | Status: DC | PRN
  Administered 2020-12-23: 50 ug/min via INTRAVENOUS

## 2020-12-23 MED ORDER — PROPOFOL 10 MG/ML IV BOLUS
INTRAVENOUS | Status: AC
  Filled 2020-12-23: qty 20

## 2020-12-23 MED ORDER — FENTANYL CITRATE (PF) 100 MCG/2ML IJ SOLN: 25.0000 ug | INTRAMUSCULAR | Status: DC | PRN

## 2020-12-23 MED ORDER — OXYCODONE HCL 5 MG PO TABS: 5.0000 mg | ORAL_TABLET | Freq: Once | ORAL | Status: DC | PRN

## 2020-12-23 MED ORDER — EPHEDRINE 5 MG/ML INJ
INTRAVENOUS | Status: AC
  Filled 2020-12-23: qty 10

## 2020-12-23 MED ORDER — FENTANYL CITRATE (PF) 250 MCG/5ML IJ SOLN
INTRAMUSCULAR | Status: DC | PRN
  Administered 2020-12-23: 50 ug via INTRAVENOUS
  Administered 2020-12-23 (×2): 25 ug via INTRAVENOUS

## 2020-12-23 MED ORDER — ORAL CARE MOUTH RINSE: 15.0000 mL | Freq: Once | OROMUCOSAL | Status: AC

## 2020-12-23 MED ORDER — SODIUM CHLORIDE 0.9 % IV SOLN: INTRAVENOUS | Status: DC

## 2020-12-23 MED ORDER — ACETAMINOPHEN 160 MG/5ML PO SOLN: 325.0000 mg | ORAL | Status: DC | PRN

## 2020-12-23 MED ORDER — AMISULPRIDE (ANTIEMETIC) 5 MG/2ML IV SOLN: 10.0000 mg | Freq: Once | INTRAVENOUS | Status: DC | PRN

## 2020-12-23 MED ORDER — SODIUM CHLORIDE 0.9 % IV SOLN
INTRAVENOUS | Status: DC | PRN
  Administered 2020-12-23: 500 mL

## 2020-12-23 MED ORDER — FENTANYL CITRATE (PF) 250 MCG/5ML IJ SOLN
INTRAMUSCULAR | Status: AC
  Filled 2020-12-23: qty 5

## 2020-12-23 MED ORDER — OXYCODONE-ACETAMINOPHEN 5-325 MG PO TABS: 1.0000 | ORAL_TABLET | Freq: Four times a day (QID) | ORAL | 0 refills | Status: DC | PRN

## 2020-12-23 SURGICAL SUPPLY — 40 items
ADH SKN CLS APL DERMABOND .7 (GAUZE/BANDAGES/DRESSINGS) ×2
ADH SKN CLS LQ APL DERMABOND (GAUZE/BANDAGES/DRESSINGS) ×2
AGENT HMST SPONGE THK3/8 (HEMOSTASIS)
ARMBAND PINK RESTRICT EXTREMIT (MISCELLANEOUS) ×8 IMPLANT
BLADE CLIPPER SURG (BLADE) ×4 IMPLANT
CANISTER SUCT 3000ML PPV (MISCELLANEOUS) ×4 IMPLANT
CLIP VESOCCLUDE MED 6/CT (CLIP) ×4 IMPLANT
CLIP VESOCCLUDE SM WIDE 6/CT (CLIP) ×4 IMPLANT
COVER PROBE W GEL 5X96 (DRAPES) ×4 IMPLANT
COVER WAND RF STERILE (DRAPES) ×4 IMPLANT
DECANTER SPIKE VIAL GLASS SM (MISCELLANEOUS) ×4 IMPLANT
DERMABOND ADHESIVE PROPEN (GAUZE/BANDAGES/DRESSINGS) ×2
DERMABOND ADVANCED (GAUZE/BANDAGES/DRESSINGS) ×2
DERMABOND ADVANCED .7 DNX12 (GAUZE/BANDAGES/DRESSINGS) ×2 IMPLANT
DERMABOND ADVANCED .7 DNX6 (GAUZE/BANDAGES/DRESSINGS) ×1 IMPLANT
ELECT REM PT RETURN 9FT ADLT (ELECTROSURGICAL) ×4
ELECTRODE REM PT RTRN 9FT ADLT (ELECTROSURGICAL) ×2 IMPLANT
GLOVE BIO SURGEON STRL SZ7.5 (GLOVE) ×4 IMPLANT
GLOVE SRG 8 PF TXTR STRL LF DI (GLOVE) ×2 IMPLANT
GLOVE SURG UNDER POLY LF SZ8 (GLOVE) ×4
GOWN STRL REUS W/ TWL LRG LVL3 (GOWN DISPOSABLE) ×4 IMPLANT
GOWN STRL REUS W/ TWL XL LVL3 (GOWN DISPOSABLE) ×4 IMPLANT
GOWN STRL REUS W/TWL LRG LVL3 (GOWN DISPOSABLE) ×8
GOWN STRL REUS W/TWL XL LVL3 (GOWN DISPOSABLE) ×8
HEMOSTAT SPONGE AVITENE ULTRA (HEMOSTASIS) IMPLANT
KIT BASIN OR (CUSTOM PROCEDURE TRAY) ×4 IMPLANT
KIT TURNOVER KIT B (KITS) ×4 IMPLANT
NS IRRIG 1000ML POUR BTL (IV SOLUTION) ×4 IMPLANT
PACK CV ACCESS (CUSTOM PROCEDURE TRAY) ×4 IMPLANT
PAD ARMBOARD 7.5X6 YLW CONV (MISCELLANEOUS) ×8 IMPLANT
SUT GORETEX 6.0 TT13 (SUTURE) IMPLANT
SUT MNCRL AB 4-0 PS2 18 (SUTURE) ×4 IMPLANT
SUT PROLENE 6 0 BV (SUTURE) ×7 IMPLANT
SUT PROLENE 7 0 BV 1 (SUTURE) IMPLANT
SUT SILK 2 0 PERMA HAND 18 BK (SUTURE) IMPLANT
SUT VIC AB 3-0 SH 27 (SUTURE) ×8
SUT VIC AB 3-0 SH 27X BRD (SUTURE) ×4 IMPLANT
TOWEL GREEN STERILE (TOWEL DISPOSABLE) ×4 IMPLANT
UNDERPAD 30X36 HEAVY ABSORB (UNDERPADS AND DIAPERS) ×4 IMPLANT
WATER STERILE IRR 1000ML POUR (IV SOLUTION) ×4 IMPLANT

## 2020-12-23 NOTE — H&P (Signed)
History and Physical Interval Note:  12/23/2020 9:11 AM  Tiffany [name]  has presented today for surgery, with the diagnosis of ESRD.  The various methods of treatment have been discussed with the patient and family. After consideration of risks, benefits and other options for treatment, the patient has consented to  Procedure(s): INSERTION OF ARTERIOVENOUS (AV) GORE-TEX GRAFT ARM LEFT VS FISTULA (Left) as a surgical intervention.  The patient's history has been reviewed, patient examined, no change in status, stable for surgery.  I have reviewed the patient's chart and labs.  Questions were answered to the patient's satisfaction.    Left arm AVF vs graft  [name]  Patient name: Tiffany [name]         MRN: [id number]        DOB: 1945-10-28          Sex: female  REASON FOR CONSULT: Dialysis access evaluation  HPI: Tiffany [name] is a 75 y.o. 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 fib status post ablation, stage V CKD, and CREST syndrome that presents for evaluation of permanent HD access.    Previously seen for PD catheter placement and ultimately I sent her to general surgery given an umbilical hernia.  Ultimately we decided this was not in her best interest after evaluation by general surgery.  She now presents for AVF vs graft placement.  On follow-up today she has not started dialysis yet.  She is right-handed and would like to avoid placement in the right arm.  No central chest wall insertions.  Nephrology note states place AVF now and wait on AVG.      Past Medical History:  Diagnosis Date  . Arthritis 04-20-12   Osteoarthritis-right hip  . Atrial fibrillation status post cardioversion, 01/30/15 maintaining SR.  01/31/2015   a. 01/2015 s/p TEE/DCCV;  b. 02/06/2015 recurrent AF noted, amio added;  c. CHA2DS2VASc = 3-->eliquis.  Marland Kitchen BENIGN POSITIONAL VERTIGO   . Cataract   . CHF  (congestive heart failure) ([place])   . Complication of anesthesia 04-20-12   some issues with prolonged sedation after anesthesia  . Diverticulosis   . DYSLIPIDEMIA   . Esophageal stricture   . GERD   . HYPERTENSION    a. severe, pt intol of med tx, Pt. has severe "whitecoat" syndrome and refused medical therapy.  . Hypothyroidism 09/29/2014   a. pt did not tolerate synthroid and this was subsequently discontinued.  . Mild LV dysfunction    a. 01/2015 Echo: EF 45-50%, mild MR, mildly dil LA, mod dil RV with mod to sev reduced fxn, mod-sev TR, PASP 56mHg.  . Osteopenia   . Raynaud disease   . URINARY INCONTINENCE 04-20-12   occ. with nighttime sleep pattern         Past Surgical History:  Procedure Laterality Date  . ATRIAL FIBRILLATION ABLATION N/A 01/26/2017   Procedure: Atrial Fibrillation Ablation;  Surgeon: AThompson Grayer MD;  [facility];  Service: Cardiovascular;  Laterality: N/A;  . breast ultrasound Right 09/13/13   There is no sonographic evidence of malignancy. the [id number] cyst in (R) breast is consistent with a benign finding. repeat in 1 year  . CARDIAC CATHETERIZATION N/A 08/29/2015   Procedure: Right Heart Cath;  Surgeon: DLarey Dresser MD;  [facility];  Service: Cardiovascular;  Laterality: N/A;  . CARDIOVERSION N/A 01/30/2015   Procedure: CARDIOVERSION;  Surgeon: MDani GobbleCroitoru,  MD;  Location: [place];  Service: Cardiovascular;  Laterality: N/A;  . CATARACT EXTRACTION, BILATERAL    . CHOLECYSTECTOMY     '90-"sludge"  . NASAL FRACTURE SURGERY  2006  . RIGHT HEART CATH N/A 06/30/2017   Procedure: RIGHT HEART CATH;  Surgeon: Larey Dresser, MD;  Location: [place] [facility] LAB;  Service: Cardiovascular;  Laterality: N/A;  . RIGHT HEART CATH N/A 08/09/2020   Procedure: RIGHT HEART CATH;  Surgeon: Larey Dresser, MD;  Location: [place] [facility] LAB;  Service: Cardiovascular;  Laterality: N/A;  . TEE  WITHOUT CARDIOVERSION N/A 01/30/2015   Procedure: TRANSESOPHAGEAL ECHOCARDIOGRAM (TEE);  Surgeon: Sanda Klein, MD;  Location: [facility] ENDOSCOPY;  Service: Cardiovascular;  Laterality: N/A;  . TEE WITHOUT CARDIOVERSION N/A 01/25/2017   Procedure: TRANSESOPHAGEAL ECHOCARDIOGRAM (TEE);  Surgeon: Fay Records, MD;  Location: [facility] ENDOSCOPY;  Service: Cardiovascular;  Laterality: N/A;  . TOTAL HIP ARTHROPLASTY  04/26/2012   Procedure: TOTAL HIP ARTHROPLASTY ANTERIOR APPROACH;  Surgeon: Mauri Pole, MD;  [facility];  Service: Orthopedics;  Laterality: Right;  . TUBAL LIGATION  1980         Family History  Adopted: Yes  Problem Relation Age of Onset  . Other Other        patient was adopted    SOCIAL HISTORY: Social History        Socioeconomic History  . Marital status: Married    Spouse name: Not on file  . Number of children: 2  . Years of education: Not on file  . Highest education level: Not on file  Occupational History  . Not on file  Tobacco Use  . Smoking status: Never Smoker  . Smokeless tobacco: Never Used  Vaping Use  . Vaping Use: Never used  Substance and Sexual Activity  . Alcohol use: Yes    Comment: less than 1 drink per week  . Drug use: No  . Sexual activity: Yes  Other Topics Concern  . Not on file  Social History Narrative   She enjoys birding.  Lives at home with husband.   Married.   retired Quarry manager, Tax inspector   Social Determinants of Radio broadcast assistant Strain: Not on [employer] Insecurity: Not on file  Transportation Needs: Not on file  Physical Activity: Not on file  Stress: Not on file  Social Connections: Not on file  Intimate Partner Violence: Not on file         Allergies  Allergen Reactions  . Levothyroxine Shortness Of Breath and Other (See Comments)    Exhaustion also - Per pt her this medication could have not been the reason for her symptoms  . Statins Other (See  Comments)    "Pain, weakness and kidney problems"  . Prednisone Other (See Comments)    Severe insomnia  . Penicillins Other (See Comments)    dry mouth Has patient had a PCN reaction causing immediate rash, facial/tongue/throat swelling, SOB or lightheadedness with hypotension: No Has patient had a PCN reaction causing severe rash involving mucus membranes or skin necrosis: No Has patient had a PCN reaction that required hospitalization No Has patient had a PCN reaction occurring within the last 10 years: No If all of the above answers are "NO", then may proceed with Cephalosporin use.           Current Outpatient Medications  Medication Sig Dispense Refill  . acetaminophen (TYLENOL) 325 MG tablet Take 2 tablets (  650 mg total) by mouth every 4 (four) hours as needed for headache or mild pain.    Marland Kitchen ELIQUIS 5 MG TABS tablet TAKE 1 TABLET BY MOUTH TWICE A DAY 180 tablet 1  . famotidine (PEPCID) 20 MG tablet TAKE 1 TABLET BY MOUTH TWICE A DAY 180 tablet 3  . flecainide (TAMBOCOR) 50 MG tablet TAKE 1 TABLET BY MOUTH TWICE A DAY 180 tablet 2  . fluticasone (FLONASE) 50 MCG/ACT nasal spray Place 1 spray daily as needed into both nostrils for allergies or rhinitis.    . macitentan (OPSUMIT) 10 MG tablet Take 1 tablet (10 mg total) by mouth daily. 30 tablet 11  . Melatonin 2.5 MG CHEW Chew 2.5 mg by mouth as needed.    . metolazone (ZAROXOLYN) 2.5 MG tablet Take 1 tablet (2.5 mg total) by mouth once a week. On Wednesdays with morning Torsemide 12 tablet 3  . metoprolol succinate (TOPROL-XL) 25 MG 24 hr tablet TAKE 1 TABLET BY MOUTH EVERY DAY 90 tablet 3  . polyethylene glycol (MIRALAX / GLYCOLAX) 17 g packet Take 17 g by mouth daily.    . potassium chloride (KLOR-CON) 10 MEQ tablet Take 1 tablet (10 mEq total) by mouth 2 (two) times daily. On the days you take Metalozone take 2 extra tablets. 200 tablet 3  . Probiotic Product (PROBIOTIC-10) CAPS Take 1 capsule by mouth 3 (three)  times a week.     . Riociguat (ADEMPAS) 2.5 MG TABS Take 2.5 mg by mouth 3 (three) times daily. 90 tablet 11  . Selexipag (UPTRAVI) 1200 MCG TABS Take 2,400 mcg by mouth 2 (two) times daily.    Marland Kitchen torsemide (DEMADEX) 20 MG tablet Take 4 tablets (80 mg total) by mouth every morning AND 2 tablets (40 mg total) every evening. 540 tablet 3   No current facility-administered medications for this visit.    REVIEW OF SYSTEMS:  '[X]'$  denotes positive finding, '[ ]'$  denotes negative finding Cardiac  Comments:  Chest pain or chest pressure:    Shortness of breath upon exertion:    Short of breath when lying flat:    Irregular heart rhythm:        Vascular    Pain in calf, thigh, or hip brought on by ambulation:    Pain in feet at night that wakes you up from your sleep:     Blood clot in your veins:    Leg swelling:         Pulmonary    Oxygen at home:    Productive cough:     Wheezing:         Neurologic    Sudden weakness in arms or legs:     Sudden numbness in arms or legs:     Sudden onset of difficulty speaking or slurred speech:    Temporary loss of vision in one eye:     Problems with dizziness:         Gastrointestinal    Blood in stool:     Vomited blood:         Genitourinary    Burning when urinating:     Blood in urine:        Psychiatric    Major depression:         Hematologic    Bleeding problems:    Problems with blood clotting too easily:        Skin    Rashes or ulcers:  Constitutional    Fever or chills:      PHYSICAL EXAM:    Vitals:   12/10/20 1559  BP: (!) 98/57  Pulse: 71  Resp: 14  Temp: 98.2 F (36.8 C)  TempSrc: Temporal  SpO2: (!) 87%  Weight: 132 lb (59.9 kg)  Height: 5' (1.524 m)    GENERAL: The patient is a well-nourished female, in no acute distress. The vital signs are documented above. CARDIAC: There is a regular  rate and rhythm.  VASCULAR:  Palpable radial pulses bilateral upper extremities PULMONARY: No respiratory distress. ABDOMEN: Soft and non-tender.  Upper midline laparotomy incision with umbilical hernia MUSCULOSKELETAL: There are no major deformities or cyanosis. NEUROLOGIC: No focal weakness or paresthesias are detected. SKIN: There are no ulcers or rashes noted. PSYCHIATRIC: The patient has a normal affect.   DATA:   Upper extremity arterial duplex shows triphasic waveforms in both upper extremities.  Extremity vein mapping shows small surface veins bilaterally.  Assessment/Plan:  75 year old female with Stage V CKD who presents for evaluation of permanent hemodialysis access (after recent evaluation for PD catheter).  Patient is right-handed and would like to have placement in the left arm which is very reasonable.  That being said, she has no usable surface veins on vein mapping today.  I discussed with her would likely would require AV graft placement in the left upper extremity.  The referral from nephrology states placed AV fistula now and wait on AV graft.  Appears her kidney function has been relatively stable at stage V CKD per Dr. Joelyn Oms note.  She will see her nephrologist again on Monday.  I will send him a note and discussed that he let us know when he feels she needs to be scheduled.  I also discussed potentially needing a catheter in the interim.  Risks and benefits discussed.   [name], MD [facility] Office: [phone]

## 2020-12-23 NOTE — Op Note (Signed)
OPERATIVE NOTE   PROCEDURE: left first stage basilic vein transposition (brachiobasilic arteriovenous fistula) placement  PRE-OPERATIVE DIAGNOSIS: Stage 5 CKD  POST-OPERATIVE DIAGNOSIS: same  SURGEON: [name], MD  ASSISTANT(S): Roxy Horseman, PA  ANESTHESIA: MAC  ESTIMATED BLOOD LOSS: Minimal  FINDING(S): Initial plan was left upper arm AV graft based on pre-operative vein mapping.  I evaluated her basilic vein with ultrasound in the upper arm and this appeared greater than 3 mm and usable.  I subsequently performed a left first stage brachiobasilic fistula near the antecubitum.  After initial anastomosis the fistula did fell slightly pulsatile even after passing a #5 dilator.  I decided to take down the anastomosis and make a slightly larger arteriotromy and redid the anastomosis.  Good thrill at completion with a palpable radial pulse.  SPECIMEN(S):  none  INDICATIONS:   Tiffany [name] is a 75 y.o. female who presents with worsening stage 5 CKD and need for permanent hemodialysis access.  The patient is scheduled for left arm AVF vs graft.  The patient is aware the risks include but are not limited to: bleeding, infection, steal syndrome, nerve damage, ischemic monomelic neuropathy, failure to mature, and need for additional procedures.  The patient is aware of the risks of the procedure and elects to proceed forward.   DESCRIPTION: After full informed written consent was obtained from the patient, the patient was brought back to the operating room and placed supine upon the operating table.  Prior to induction, the patient received IV antibiotics.   After obtaining adequate anesthesia, the patient was then prepped and draped in the standard fashion for a left arm access procedure.  I turned my attention first to identifying the patient's cephalic vein which was very small and not usable.  I then evaluated the basilic vein and this looks greater than 3 mm and  usable.      Using SonoSite guidance, the location of these vessels were marked out on the skin.   At this point, I injected local anesthetic to obtain a field block of the antecubitum.  In total, I injected about 10 mL of 1% lidocaine without epinephrine.  I made a transverse incision at the level of the antecubitum and dissected through the subcutaneous tissue and fascia to gain exposure of the brachial artery.  This was noted to be 4 mm in diameter externally.  This was dissected out proximally and distally and controlled with vessel loops .  I then dissected out the basilic vein.  This was noted to be 3 mm in diameter externally.  The distal segment of the vein was ligated with a  2-0 silk, and the vein was transected.  The proximal segment was interrogated with serial dilators.  The vein accepted up to a 5 mm dilator without any difficulty.  I then instilled the heparinized saline into the vein and clamped it.  At this point, I reset my exposure of the brachial artery.  The patient was given 3000 units IV heparin.  I then placed the artery under tension proximally and distally.  I made an arteriotomy with a #11 blade, and then I extended the arteriotomy with a Potts scissor.  I injected heparinized saline proximal and distal to this arteriotomy.  The vein was then sewn to the artery in an end-to-side configuration with a running stitch of 6-0 Prolene.  Prior to completing this anastomosis, I allowed the vein and artery to backbleed.  Ultimately this was quite pulsatile.  I tried freeing up any tension by mobilizing the vein.  Ultimately I elected to re-do the anastomosis which a slightly larger arteriotomy.  There was a palpable thrill in the venous outflow, and there was a palpable radial pulse.  At this point, I irrigated out the surgical wound.  There was no further active bleeding.  The subcutaneous tissue was reapproximated with a running stitch of 3-0 Vicryl.  The skin was then reapproximated with a  running subcuticular stitch of 4-0 Monocryl.  The skin was then cleaned, dried, and reinforced with Dermabond.  The patient tolerated this procedure well.   COMPLICATIONS: None  CONDITION: Stable  [name] MD [facility] Office: [place]   12/23/2020, 11:22 AM

## 2020-12-23 NOTE — Anesthesia Postprocedure Evaluation (Signed)
Anesthesia Post Note  Patient: Tiffany [name]  Procedure(s) Performed: LEFT FIRST STAGE BASCILIC VEIN TRANSPOSITION (Left Arm Upper)     Patient location during evaluation: PACU Anesthesia Type: MAC Level of consciousness: awake and alert Pain management: pain level controlled Vital Signs Assessment: post-procedure vital signs reviewed and stable Respiratory status: spontaneous breathing, nonlabored ventilation, respiratory function stable and patient connected to nasal cannula oxygen Cardiovascular status: stable and blood pressure returned to baseline Postop Assessment: no apparent nausea or vomiting Anesthetic complications: no   No complications documented.  Last Vitals:  Vitals:   12/23/20 1150 12/23/20 1205  BP: 99/62 (!) 96/58  Pulse: (!) 58 (!) 58  Resp: 20 18  Temp:  36.5 C  SpO2: 93% 92%    Last Pain:  Vitals:   12/23/20 1205  TempSrc:   PainSc: 0-No pain                 Effie Berkshire

## 2020-12-23 NOTE — Telephone Encounter (Signed)
LATE ENTRY from Friday 12/20/20, pt was in for lab appt 5/20 and reported she had not heard from [facility] about her home oxygen that was ordered the week prior. Attempted to call [employer] to f/u on referral but unable to speak with anyone. Referral faxed to [facility] at [phone], advised pt if she does not hear from them early next week to let us know.

## 2020-12-23 NOTE — Transfer of Care (Signed)
Immediate Anesthesia Transfer of Care Note  Patient: Tiffany [name]  Procedure(s) Performed: LEFT FIRST STAGE BASCILIC VEIN TRANSPOSITION (Left Arm Upper)  Patient Location: PACU  Anesthesia Type:MAC  Level of Consciousness: drowsy and patient cooperative  Airway & Oxygen Therapy: Patient Spontanous Breathing and Patient connected to nasal cannula oxygen  Post-op Assessment: Report given to RN and Post -op Vital signs reviewed and stable  Post vital signs: Reviewed and stable  Last Vitals:  Vitals Value Taken Time  BP    Temp    Pulse    Resp    SpO2      Last Pain:  Vitals:   12/23/20 0805  TempSrc:   PainSc: 0-No pain      Patients Stated Pain Goal: 3 (58/83/25 4982)  Complications: No complications documented.

## 2020-12-24 ENCOUNTER — Encounter (HOSPITAL_COMMUNITY): Payer: Self-pay | Admitting: Vascular Surgery

## 2020-12-25 DIAGNOSIS — N1832 Chronic kidney disease, stage 3b: Secondary | ICD-10-CM | POA: Diagnosis not present

## 2020-12-25 DIAGNOSIS — M340 Progressive systemic sclerosis: Secondary | ICD-10-CM | POA: Diagnosis not present

## 2020-12-25 DIAGNOSIS — I2721 Secondary pulmonary arterial hypertension: Secondary | ICD-10-CM | POA: Diagnosis not present

## 2020-12-26 ENCOUNTER — Other Ambulatory Visit: Payer: Self-pay | Admitting: *Deleted

## 2020-12-26 DIAGNOSIS — N185 Chronic kidney disease, stage 5: Secondary | ICD-10-CM

## 2020-12-27 ENCOUNTER — Other Ambulatory Visit (HOSPITAL_COMMUNITY): Payer: Self-pay | Admitting: *Deleted

## 2020-12-27 ENCOUNTER — Telehealth (HOSPITAL_COMMUNITY): Payer: Self-pay | Admitting: Pharmacy Technician

## 2020-12-27 MED ORDER — UPTRAVI 1200 MCG PO TABS: 2.0000 | ORAL_TABLET | Freq: Two times a day (BID) | ORAL | 11 refills | Status: AC

## 2020-12-27 NOTE — Telephone Encounter (Signed)
Advanced Heart Failure Patient Advocate Encounter  Prior Authorization for Malvin Johns 825mg has been approved.    Effective dates: 12/19/20 through 08/02/21  ECharlann Boxer CPhT

## 2021-01-07 ENCOUNTER — Ambulatory Visit: Payer: Medicare PPO | Admitting: Internal Medicine

## 2021-01-07 ENCOUNTER — Encounter: Payer: Self-pay | Admitting: Internal Medicine

## 2021-01-07 VITALS — BP 80/40 | HR 66 | Ht 60.0 in | Wt 135.0 lb

## 2021-01-07 DIAGNOSIS — R109 Unspecified abdominal pain: Secondary | ICD-10-CM

## 2021-01-07 DIAGNOSIS — R935 Abnormal findings on diagnostic imaging of other abdominal regions, including retroperitoneum: Secondary | ICD-10-CM | POA: Diagnosis not present

## 2021-01-07 DIAGNOSIS — K869 Disease of pancreas, unspecified: Secondary | ICD-10-CM

## 2021-01-07 DIAGNOSIS — K469 Unspecified abdominal hernia without obstruction or gangrene: Secondary | ICD-10-CM

## 2021-01-07 NOTE — Patient Instructions (Signed)
If you are age 77 or older, your body mass index should be between 23-30. Your Body mass index is 26.37 kg/m. If this is out of the aforementioned range listed, please consider follow up with your Primary Care Provider.  If you are age 41 or younger, your body mass index should be between 19-25. Your Body mass index is 26.37 kg/m. If this is out of the aformentioned range listed, please consider follow up with your Primary Care Provider.   __________________________________________________________  The  GI providers would like to encourage you to use [facility] to communicate with providers for non-urgent requests or questions.  Due to long hold times on the telephone, sending your provider a message by [facility] may be a faster and more efficient way to get a response.  Please allow 48 business hours for a response.  Please remember that this is for non-urgent requests.   Please follow up as needed

## 2021-01-07 NOTE — Progress Notes (Signed)
HISTORY OF PRESENT ILLNESS:  Tiffany [name] is a 75 y.o. female with multiple significant medical problems as outlined below.  She was last seen in the office October 30, 2020.  Please see that extensive encounter for details.  She was felt to have episodic abdominal pain occasionally associated with vomiting likely secondary to intermittent partial small bowel obstruction.  Noted to have abdominal hernias.  She does have a history of small bowel obstruction.  She also had indeterminate lesion of the pancreas on CT.  We recommended abdominal corset.  She did find an abdominal binder but about this was comfortable in terms of her breathing.  Recommended MiraLAX to achieve regular bowel movements.  However, this resulted in loose stools with incontinence.  At the recommendation of her cardiologist he has been using Colace with success.  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  I discussed this finding with my EUS colleague, Dr. Ardis Hughs.  Repeat MRI in 1 year recommended.  She presents today with her husband Tiffany [name].  She has been seeing her specialists including nephrology, cardiology, and vascular surgery.  They are attempting to mature an AV fistula in her left arm.  She is having progressive difficulty with shortness of breath related to her pulmonary hypertension and heart failure.  No new complaints from GI perspective.  REVIEW OF SYSTEMS:  All non-GI ROS negative unless otherwise stated in the HPI.  Past Medical History:  Diagnosis Date  . Anemia   . Arthritis 04-20-12   Osteoarthritis-right hip  . Atrial fibrillation status post cardioversion, 01/30/15 maintaining SR.  01/31/2015   a. 01/2015 s/p TEE/DCCV;  b. 02/06/2015 recurrent AF noted, amio added;  c. CHA2DS2VASc = 3-->eliquis.  Marland Kitchen BENIGN POSITIONAL VERTIGO   . Cataract   . CHF (congestive heart failure) ([place])   . Chronic kidney disease   . Complication of anesthesia 04-20-12   some issues with  prolonged sedation after anesthesia  . Diverticulosis   . DYSLIPIDEMIA   . Dyspnea   . Dysrhythmia   . Esophageal stricture   . GERD   . HYPERTENSION    a. severe, pt intol of med tx, Pt. has severe "whitecoat" syndrome and refused medical therapy.  . Hypothyroidism 09/29/2014   a. pt did not tolerate synthroid and this was subsequently discontinued.  . Mild LV dysfunction    a. 01/2015 Echo: EF 45-50%, mild MR, mildly dil LA, mod dil RV with mod to sev reduced fxn, mod-sev TR, PASP 41mHg.  . Osteopenia   . Pulmonary hypertension ([place]   . Raynaud disease    CREST  . Sleep apnea   . URINARY INCONTINENCE 04-20-12   occ. with nighttime sleep pattern    Past Surgical History:  Procedure Laterality Date  . ATRIAL FIBRILLATION ABLATION N/A 01/26/2017   Procedure: Atrial Fibrillation Ablation;  Surgeon: AThompson Grayer MD;  [facility];  Service: Cardiovascular;  Laterality: N/A;  . BASCILIC VEIN TRANSPOSITION Left 12/23/2020   Procedure: LEFT FIRST [place]AGE [place]  Surgeon: CMarty Heck MD;  [facility]  Service: Vascular;  Laterality: Left;  . breast ultrasound Right 09/13/13   There is no sonographic evidence of malignancy. the [id number] cyst in (R) breast is consistent with a benign finding. repeat in 1 year  . CARDIAC CATHETERIZATION N/A 08/29/2015   Procedure: Right Heart Cath;  Surgeon: DLarey Dresser MD;  [facility];  Service: Cardiovascular;  Laterality: N/A;  . CARDIOVERSION N/A 01/30/2015   Procedure: CARDIOVERSION;  Surgeon: Sanda Klein, MD;  [facility]: [facility];  Service: Cardiovascular;  Laterality: N/A;  . CATARACT EXTRACTION, BILATERAL    . CHOLECYSTECTOMY     '90-"sludge"  . NASAL FRACTURE SURGERY  2006  . RIGHT HEART CATH N/A 06/30/2017   Procedure: RIGHT HEART CATH;  Surgeon: Larey Dresser, MD;  Location: [place] [facility];  Service: Cardiovascular;  Laterality: N/A;  . RIGHT HEART  CATH N/A 08/09/2020   Procedure: RIGHT HEART CATH;  Surgeon: Larey Dresser, MD;  Location: [place] [facility];  Service: Cardiovascular;  Laterality: N/A;  . TEE WITHOUT CARDIOVERSION N/A 01/30/2015   Procedure: TRANSESOPHAGEAL ECHOCARDIOGRAM (TEE);  Surgeon: Sanda Klein, MD;  Location: [facility] END[facility]COPY;  Service: Cardiovascular;  Laterality: N/A;  . TEE WITHOUT CARDIOVERSION N/A 01/25/2017   Procedure: TRANSESOPHAGEAL ECHOCARDIOGRAM (TEE);  Surgeon: Fay Records, MD;  Location: [facility] ENDOSCOPY;  Service: Cardiovascular;  Laterality: N/A;  . TOTAL HIP ARTHROPLASTY  04/26/2012   Procedure: TOTAL HIP ARTHROPLASTY ANTERIOR APPROACH;  Surgeon: Mauri Pole, MD;  Location: [facility];  Service: Orthopedics;  Laterality: Right;  . [place]  reports that she has never smoked. She has never used smokeless tobacco. She reports current alcohol use. She reports that she does not use drugs.  family history includes Other in an other family member. She was adopted.  Allergies  Allergen Reactions  . Levothyroxine Shortness Of Breath and Other (See Comments)    Exhaustion also - Per pt her this medication could have not been the reason for her symptoms  . Statins Other (See Comments)    "Pain, weakness and kidney problems"  . Prednisone Other (See Comments)    Severe insomnia  . Penicillins Other (See Comments)    dry mouth Has patient had a PCN reaction causing immediate rash, facial/tongue/throat swelling, SOB or lightheadedness with hypotension: No Has patient had a PCN reaction causing severe rash involving mucus membranes or skin necrosis: No Has patient had a PCN reaction that required hospitalization No Has patient had a PCN reaction occurring within the last 10 years: No If all of the above answers are "NO", then may proceed with Cephalosporin use.        PHYSICAL EXAMINATION: Vital signs: BP (!) 80/40   Pulse 66   Ht 5' (1.524 m)   Wt 135 lb  (61.2 kg)   BMI 26.37 kg/m   Constitutional: Pleasant, chronically ill-appearing in wheelchair with dyspnea at rest, no acute distress Psychiatric: alert and oriented x3, cooperative Eyes: extraocular movements intact, anicteric, conjunctiva pink Mouth: oral pharynx moist, no lesions Cardiovascular: heart regular rate and rhythm, no murmur Lungs: clear to auscultation bilaterally Abdomen: soft, nontender, nondistended, no obvious ascites, no peritoneal signs, normal bowel sounds, no organomegaly.  Abdominal wall hernias as previous Rectal: Omitted Extremities: no clubbing or cyanosis.  3+ lower extremity edema bilaterally Skin: no lesions on visible extremities Neuro: No focal deficits.  Cranial nerves intact  ASSESSMENT:  1.  Episodic abdominal pain, possibly partial small bowel obstruction. 2.  History of partial small bowel obstruction 3.  Coagulated with Colace 4.  Indeterminate lesion of the pancreas on MRI as described 5.  History of GERD complicated by peptic stricture.  Asymptomatic post dilation on famotidine 6.  Multiple extensive medical problems.   PLAN:  1.  Continue Colace.  Titrate or adjust as needed  to achieve desired effect 2.  Repeat MRI of the pancreas in 1 year 3.  Ongoing care with multiple specialists 4.  GI follow-up as needed

## 2021-01-08 ENCOUNTER — Telehealth (HOSPITAL_COMMUNITY): Payer: Self-pay | Admitting: Pharmacy Technician

## 2021-01-08 DIAGNOSIS — I272 Pulmonary hypertension, unspecified: Secondary | ICD-10-CM | POA: Diagnosis not present

## 2021-01-08 NOTE — Telephone Encounter (Signed)
Patient Advocate Encounter   Received notification from [facility] that prior authorization for Tiffany [name] is required.   PA submitted on CoverMyMeds Key [id number] Status is pending   Will continue to follow.

## 2021-01-14 NOTE — Telephone Encounter (Signed)
Advanced Heart Failure Patient Advocate Encounter  Prior Authorization for Uptravi 1200 mcg has been approved.    PA# [id number] Effective dates: 01/08/21 through 08/02/21  Charlann Boxer, CPhT

## 2021-01-15 ENCOUNTER — Other Ambulatory Visit: Payer: Self-pay

## 2021-01-15 ENCOUNTER — Encounter (HOSPITAL_COMMUNITY): Payer: Self-pay | Admitting: Cardiology

## 2021-01-15 ENCOUNTER — Ambulatory Visit (HOSPITAL_COMMUNITY)
Admission: RE | Admit: 2021-01-15 | Discharge: 2021-01-15 | Disposition: A | Discharge status: Home / Self Care | Payer: Medicare PPO | Source: Ambulatory Visit | Attending: Cardiology | Admitting: Cardiology

## 2021-01-15 VITALS — BP 92/50 | HR 67 | Wt 139.0 lb

## 2021-01-15 DIAGNOSIS — G4730 Sleep apnea, unspecified: Secondary | ICD-10-CM | POA: Diagnosis not present

## 2021-01-15 DIAGNOSIS — M341 CR(E)ST syndrome: Secondary | ICD-10-CM | POA: Diagnosis not present

## 2021-01-15 DIAGNOSIS — I480 Paroxysmal atrial fibrillation: Secondary | ICD-10-CM | POA: Diagnosis not present

## 2021-01-15 DIAGNOSIS — N184 Chronic kidney disease, stage 4 (severe): Secondary | ICD-10-CM

## 2021-01-15 DIAGNOSIS — I5032 Chronic diastolic (congestive) heart failure: Secondary | ICD-10-CM | POA: Diagnosis not present

## 2021-01-15 DIAGNOSIS — Z7901 Long term (current) use of anticoagulants: Secondary | ICD-10-CM | POA: Insufficient documentation

## 2021-01-15 DIAGNOSIS — I4510 Unspecified right bundle-branch block: Secondary | ICD-10-CM | POA: Insufficient documentation

## 2021-01-15 DIAGNOSIS — I272 Pulmonary hypertension, unspecified: Secondary | ICD-10-CM | POA: Diagnosis not present

## 2021-01-15 DIAGNOSIS — I2721 Secondary pulmonary arterial hypertension: Secondary | ICD-10-CM | POA: Insufficient documentation

## 2021-01-15 DIAGNOSIS — Z79899 Other long term (current) drug therapy: Secondary | ICD-10-CM | POA: Insufficient documentation

## 2021-01-15 DIAGNOSIS — I130 Hypertensive heart and chronic kidney disease with heart failure and stage 1 through stage 4 chronic kidney disease, or unspecified chronic kidney disease: Secondary | ICD-10-CM | POA: Diagnosis not present

## 2021-01-15 LAB — CBC
HCT: 29 % — ABNORMAL LOW (ref 36.0–46.0)
Hemoglobin: 9.2 g/dL — ABNORMAL LOW (ref 12.0–15.0)
MCH: 28 pg (ref 26.0–34.0)
MCHC: 31.7 g/dL (ref 30.0–36.0)
MCV: 88.1 fL (ref 80.0–100.0)
Platelets: 128 10*3/uL — ABNORMAL LOW (ref 150–400)
RBC: 3.29 MIL/uL — ABNORMAL LOW (ref 3.87–5.11)
RDW: 16.4 % — ABNORMAL HIGH (ref 11.5–15.5)
WBC: 3.9 10*3/uL — ABNORMAL LOW (ref 4.0–10.5)
nRBC: 0 % (ref 0.0–0.2)

## 2021-01-15 LAB — BASIC METABOLIC PANEL
Anion gap: 13 (ref 5–15)
BUN: 131 mg/dL — ABNORMAL HIGH (ref 8–23)
CO2: 19 mmol/L — ABNORMAL LOW (ref 22–32)
Calcium: 9.1 mg/dL (ref 8.9–10.3)
Chloride: 102 mmol/L (ref 98–111)
Creatinine, Ser: 6.2 mg/dL — ABNORMAL HIGH (ref 0.44–1.00)
GFR, Estimated: 7 mL/min — ABNORMAL LOW (ref >60)
Glucose, Bld: 105 mg/dL — ABNORMAL HIGH (ref 70–99)
Potassium: 4.5 mmol/L (ref 3.5–5.1)
Sodium: 134 mmol/L — ABNORMAL LOW (ref 135–145)

## 2021-01-15 LAB — HEPATITIS B SURFACE ANTIGEN: Hepatitis B Surface Ag: NONREACTIVE

## 2021-01-15 MED ORDER — TORSEMIDE 20 MG PO TABS: ORAL_TABLET | ORAL | 3 refills | Status: DC

## 2021-01-15 NOTE — Patient Instructions (Addendum)
EKG done today.   Labs done today. We will contact you only if your labs are abnormal.  INCREASE Torsemide to '100mg'$  (5 tablets) by mouth every morning and in '80mg'$  (4 tablets) by mouth every evening.   No other medication changes were made. Please continue all current medications as prescribed.  Your physician recommends that you schedule a follow-up appointment in: 10 days for a lab only appointment and in 3 weeks with our [facility] here in our office.   If you have any questions or concerns before your next appointment please send [place] a message through [place] or call our office at [phone].    TO LEAVE A MESSAGE FOR THE NURSE SELECT OPTION 2, PLEASE LEAVE A MESSAGE INCLUDING: YOUR NAME DATE OF BIRTH CALL BACK NUMBER REASON FOR CALL**this is important as we prioritize the call backs  YOU WILL RECEIVE A CALL BACK THE SAME DAY AS LONG AS YOU CALL BEFORE 4:00 PM   Do the following things EVERYDAY: Weigh yourself in the morning before breakfast. Write it down and keep it in a log. Take your medicines as prescribed Eat low salt foods--Limit salt (sodium) to 2000 mg per day.  Stay as active as you can everyday Limit all fluids for the day to less than 2 liters   At the [facility], you and your health needs are our priority. As part of our continuing mission to provide you with exceptional heart care, we have created designated Provider Care Teams. These Care Teams include your primary Cardiologist (physician) and Advanced Practice Providers (APPs- Physician Assistants and Nurse Practitioners) who all work together to provide you with the care you need, when you need it.   You may see any of the following providers on your designated Care Team at your next follow up: Dr Glori Bickers Dr Haynes Kerns, NP Lyda Jester, [place] Audry Riles, PharmD   Please be sure to bring in all your medications bottles to every appointment.

## 2021-01-16 NOTE — Progress Notes (Signed)
Patient ID: Tiffany [name], female   DOB: 1945/08/21, 75 y.o.   MRN: [id number] PCP: Dr. Sharlet Salina HF Cardiology: Dr. [name]  75 y.o. with history of paroxysmal atrial fibrillation and diastolic CHF was noted to have significant pulmonary hypertension and exertional dyspnea.  She has history of paroxysmal atrial fibrillation and had an episode in 6/16 requiring cardioversion.  She was started on amiodarone but this was stopped with worsening breathing.  Cardiolite in 9/16 showed on ischemia or infarction.     I had her do a RHC in 1/17.  This showed elevated left and right heart filling pressures but also PAH. After cath, she increased her Lasix from 40 qod to 40 daily.  At a prior appointment, I increased Lasix to 40 mg bid and also started her on Opsumit.  At next appointment, I added Adcirca 20 and then titrated it up to 40 mg daily. She continue to be volume overloaded, so  I increased Lasix to 80 qam/40 qpm. She has seemed to do well on this dose.   Echo was done in 6/17, RV mildly dilated with moderately decreased systolic function and PASP 85 mmHg.    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   She had an atrial fibrillation ablation in 6/18.   RHC in 11/18 showed lower PA pressure than in the past with PA 54/16 and PVR 3.7 WU.  Echo showed preserved LV systolic function and normal RV size and function.  Holter showed PACs and PVCs, no atrial fibrillation.  I started her on Toprol XL.  CT chest did not show any significant lung findings. Echo in 12/19 showed EF 65-70%, moderate diastolic dysfunction, mild AS, mild MR, PASP 47 mmHg, normal RV.   She was started on flecainide for paroxysmal atrial fibrillation and has not had symptomatic atrial fibrillation for months now.   Echo in 1/21 showed EF 60-65%, moderately dilated RV with normal systolic function, mild-moderate TR with PASP 88 mmHg, IVC normal.   RHC in 1/22 showed normal filling pressures but still with  severe pulmonary hypertension.  Cardiac output was preserved.  Echo in 1/22 showed EF 55-60%, moderate RV dilation with moderately decreased systolic function, moderate TR, PASP 117 mmHg with dilated IVC.    She returns today for followup of RV failure and pulmonary hypertension.  Creatinine has been slowly but steadily trending up.  She is followed by nephrology and is making plans for eventual HD.  She is not a candidate for PD catheter placement due to ventral hernias. She recently had AV fistula placed.  She is feeling worse today.  She has chronic itching and altered taste.  She is starting to get sleepy and foggy.  She is now on home oxygen but is still short of breath with mild exertion around the house.  She came in in a wheelchair today.  Mild orthopnea.  No chest pain.  No lightheadedness. Weight is up another 7 lbs.    ECG (personally reviewed): NSR, iRBBB, possible RVH, inferior and anterior TWIs.   6 minute walk (2/17): 141 m 6 minute walk (3/17): 182 m 6 minute walk (5/17): 229 m 6 minute walk (10/17): 305 m 6 minute walk (5/18): 256 m 6 minute walk (8/18): 427 m 6 minute walk (11/18): 61 m, oxygen saturation dropped as low as 70s.  6 minute walk (3/19): 260 m 6 minute walk (6/19): 372 m 6 minute walk (12/19): 317 m 6 minute walk (4/21): 305  m 6 minute walk (11/21): 243 m  Labs (12/16): BNP 1187, ANA 1:640, TSH elevated. Labs (2/17): K 5.4 => 3.9, creatinine 1.78 => 1.66, LDL 112, BNP 880 Labs (4/17): K 4.5, creatinine 1.76 Labs (5/17): K 4.1, creatinine 1.79 Labs (8/17): K 3.1, creatinine 1.7, BNP 404 Labs (9/17): K 3.9, creatinine 1.8, BNP 512 Labs (2/18): K 3.5, creatinine 1.71, BNP 157 Labs (5/18): LDL 120 Labs (6/18): K 3.6, creatinine 1.85 Labs (8/18): K 3.5, creatinine 1.78, BNP 88 Labs (11/18): K 3.6 => 3.2, creatinine 1.84 => 1.7, hgb 12.7 => 11.2. Labs (3/19): K 3.8, creatinine 1.74, LDL 123, HDL 56, TSH normal Labs (6/19): K 3.5, creatinine 1.91 Labs (9/19): K  3.5, creatinine 1.9 Labs (1/21): K 3.9, creatinine 2.44 Labs (4/21): K 3.8, creatinine 2.59, BNP 268 Labs (5/21): K 3.5, creatinine 2.9 Labs (9/21): K 4.4, creatinine 2.7, hgb 11 Labs (11/21): TSH normal Labs (12/21): hgb 10.1, K 4.2, creatinine 2.88 Labs (1/22): K 4.2, creatinine 3.22 Labs (3/22): K 4.4, creatinine 3.5 => 2.9, hgb 11.3, TSH normal Labs (4/22): K 4.6, creatinine 3.37 Labs (5/22): K 3.5, creatinine 4.75  PMH:  1. CKD stage III 2. Bradycardia in setting of metoprolol + amiodarone use.  3. Raynauds phenomenon. 4. BPPV 5. OA right hip 6. Hyperthyroidism 7. Atrial fibrillation: Paroxysmal.  She was on amiodarone in the past but this was stopped when she became more short of breath.  TEE-guided DCCV in 6/16.  - Atrial fibrillation ablation 6/18.  - Holter (12/18) with PACs/PVCs, no atrial fibrillation.  8. HTN 9. 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 - TEE (6/16) with EF 45-50%, mildly dilated RV with mildly decreased systolic function, PASP 36 mmHg.  - RHC (1/17) with mean RA 9, PA 80/29 mean 52, mean PCWP 27, CI 1.97 Fick/1.8 thermo, PVR 7.6 Fick/8.4 thermo.  - Echo (6/17): EF 60-65%, mild MR, mild RV dilation with moderately decreased systolic function, moderate TR, PASP 85 mmHg.  - Echo (6/18): EF [id number], normal diastolic function, normal RV size and systolic function, PASP 36 mmHg. - Echo (12/18): EF 60-65%, grade II diastolic dysfunction, mild MR, normal RV size and systolic function, PASP 52 mmHg.  - RHC (11/18): mean RA 2, PA 54/16 mean 32, mean PCWP 9, CI 3.83, PVR 3.7 WU.  - Echo (12/19): EF 65-70%, moderate diastolic dysfunction, mild AS, mild MR, PASP 47 mmHg.  - Echo (1/21): EF 60-65%, moderately dilated RV with normal systolic function, mild-moderate TR with PASP 88 mmHg, IVC normal.  - Echo (1/22): EF 55-60%,  moderate RV dilation with moderately decreased systolic function, moderate TR, PASP 117 mmHg with dilated IVC.   - RHC (1/22): mean RA 4, PA 87/26 mean 47, mean PCWP 9, CI 4.36 Fick, CI 3.61 thermo, PVR 5.6.  10. Pulmonary hypertension: See RHC above.  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  - TEE (6/16) with mildly dilated RV with mildly decreased systolic function. - ANA 0000000, anti-centromere antibody elevated, anti-SCL-70 antibody negative, h/o Raynauds - PFTs (7/16) with minimal obstructive defect but moderately decreased DLCO.  - V/Q scan (2/17) negative for acute or chronic PE.  11. Cardiolite (9/16) with no ischemia/infarction.  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  13. OSA: Severe on 9/17 sleep study. Now using CPAP.  14. GERD 15. Thyroid nodule: right nodule seen on 3/19 [place].  16.  Fe deficiency anemia.  17. Umbilical hernia 18. H/o partial SBO 19. Pancreatic lesion: Indeterminant, followed by GI.   SH: Married, lives in [place], no smoking, no ETOH.   FH: Adopted.  Daughter has Raynaud's.  ROS: All systems reviewed and negative except as per HPI  Current Outpatient Medications  Medication Sig Dispense Refill   acetaminophen (TYLENOL) 325 MG tablet Take 2 tablets (650 mg total) by mouth every 4 (four) hours as needed for headache or mild pain.     ELIQUIS 5 MG TABS tablet TAKE 1 TABLET BY MOUTH TWICE A DAY 180 tablet 1   famotidine (PEPCID) 20 MG tablet TAKE 1 TABLET BY MOUTH TWICE A DAY 180 tablet 3   flecainide (TAMBOCOR) 50 MG tablet TAKE 1 TABLET BY MOUTH TWICE A DAY 180 tablet 2   fluticasone (FLONASE) 50 MCG/ACT nasal spray Place 1 spray daily as needed into both nostrils for allergies or rhinitis.     macitentan (OPSUMIT) 10 MG tablet Take 1 tablet (10 mg total) by mouth daily. 30 tablet 11   metolazone (ZAROXOLYN) 2.5 MG tablet Take 1 tablet (2.5 mg total) by mouth 2 (two) times a week. On Wednesdays and Saturdays 15 tablet 3   metoprolol succinate (TOPROL-XL)  25 MG 24 hr tablet TAKE 1 TABLET BY MOUTH EVERY DAY 90 tablet 3   potassium chloride (KLOR-CON) 10 MEQ tablet Take 20 mEq by mouth 2 (two) times daily. Morning and lunch  Extra 2 tabs w Metalazone     Probiotic Product (PROBIOTIC-10) CAPS Take 1 capsule by mouth 3 (three) times a week.      Riociguat (ADEMPAS) 2.5 MG TABS Take 2.5 mg by mouth 3 (three) times daily. 90 tablet 11   Selexipag (UPTRAVI) 1200 MCG TABS Take 2 tablets by mouth in the morning and at bedtime. 120 tablet 11   torsemide (DEMADEX) 20 MG tablet Take 5 tablets (100 mg total) by mouth every morning AND 4 tablets (80 mg total) every evening. 810 tablet 3   No current facility-administered medications for this encounter.   BP (!) 92/50   Pulse 67   Wt 63 kg (139 lb)   SpO2 92% Comment: 3l N/C  BMI 27.15 kg/m   General: NAD Neck: JVP 14-16, no thyromegaly or thyroid nodule.  Lungs: Clear to auscultation bilaterally with normal respiratory effort. CV: Nondisplaced PMI.  Heart regular S1/S2, no S3/S4, 2/6 SEM RUSB.  2+ edema to knees.  No carotid bruit.  Normal pedal pulses.  Abdomen: Soft, nontender, no hepatosplenomegaly, no distention.  Skin: Intact without lesions or rashes.  Neurologic: Alert and oriented x 3.  Psych: Normal affect. Extremities: No clubbing or cyanosis.  HEENT: Normal.   Assessment/Plan: 1. Pulmonary hypertension: Patient has pulmonary arterial hypertension.  She appears to have co-existing diastolic LV dysfunction given elevated PCWP on prior RHC in 1/17.  PFTs did not show significant obstruction, they only showed moderately decreased DLCO consistent with pulmonary vascular disease. V/Q scan did not show evidence for chronic PE.  PVR 7.6 WU by Fick and 8.4 WU by thermodiluation on 1/17 RHC.  Cardiac index was low, 1.97 Fick/1.8 thermo. 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  ANA 1:640 with elevated anti-centromere antibody and Raynaud's phenomenon, suspect PAH related to rheumatological disease, incomplete  CREST syndrome per Dr Elmon Else last note.  Given worsening symptoms, RHC was repeated in 11/18.  This showed PVR 3.7 WU with mean PA pressure 32 and normal CI.  RHC done in 1/22 showed normal filling  pressures and cardiac output but still with severe pulmonary hypertension.   Echo in 1/22 showed normal LV systolic function but moderately dilated and dysfunctional RV.   - She is now on home oxygen 3L by [place].  - Continue riociguat 2.5 mg tid.  - She has sleep apnea, now using CPAP.  - Continue Opsumit.  - She has tolerated increase in Uptravi to 2400 mcg bid.  2. Chronic diastolic CHF/RV failure: Elevated PCWP on 1/17 RHC.  NYHA class IV symptoms, weight is up and she is markedly volume overloaded on exam.  Volume overload is becoming intractable in the setting of advanced CKD.   - Increase torsemide to 100 qam/80 qpm.  BMET today and in 10 days.  - Continue metolazone twice a week.  - Continue home oxygen.  3. Atrial fibrillation: Paroxysmal.  She is in NSR today.  She had atrial fibrillation ablation in 6/18. She is now on flecainide.  - Continue Toprol XL 25 mg daily.  - Continue apixaban - Continue flecainide.  4. CKD: Stage IV-V. ?If scleroderma plays a role.  She is now seeing nephrology and planning for future HD is being done.  PD was explored, but she has extensive ventral hernias and does not seem to be a good candidate for PD catheter placement.  Surgical repair of the hernias under general anesthesia would be high risk given her severe PH.  She now has intractable volume overload and uremic symptoms.  I think it is time for HD.  - BMET today.  - I spoke with Dr. Joelyn Oms today. He will contact her about getting a temporary HD catheter placed while fistula matures and starting HD soon.  5. CREST syndrome: Seeing Dr. Amil Amen.   Followup in 1 month.   Loralie Champagne 01/16/2021

## 2021-01-17 DIAGNOSIS — M341 CR(E)ST syndrome: Secondary | ICD-10-CM | POA: Diagnosis not present

## 2021-01-17 DIAGNOSIS — I270 Primary pulmonary hypertension: Secondary | ICD-10-CM | POA: Diagnosis not present

## 2021-01-17 DIAGNOSIS — I5030 Unspecified diastolic (congestive) heart failure: Secondary | ICD-10-CM | POA: Diagnosis not present

## 2021-01-17 DIAGNOSIS — K429 Umbilical hernia without obstruction or gangrene: Secondary | ICD-10-CM | POA: Diagnosis not present

## 2021-01-17 DIAGNOSIS — N184 Chronic kidney disease, stage 4 (severe): Secondary | ICD-10-CM | POA: Diagnosis not present

## 2021-01-17 DIAGNOSIS — D631 Anemia in chronic kidney disease: Secondary | ICD-10-CM | POA: Diagnosis not present

## 2021-01-21 ENCOUNTER — Ambulatory Visit (HOSPITAL_COMMUNITY): Payer: Medicare PPO

## 2021-01-21 DIAGNOSIS — D509 Iron deficiency anemia, unspecified: Secondary | ICD-10-CM | POA: Insufficient documentation

## 2021-01-21 DIAGNOSIS — D631 Anemia in chronic kidney disease: Secondary | ICD-10-CM | POA: Diagnosis not present

## 2021-01-21 DIAGNOSIS — T782XXA Anaphylactic shock, unspecified, initial encounter: Secondary | ICD-10-CM | POA: Insufficient documentation

## 2021-01-21 DIAGNOSIS — I270 Primary pulmonary hypertension: Secondary | ICD-10-CM | POA: Diagnosis not present

## 2021-01-21 DIAGNOSIS — G4733 Obstructive sleep apnea (adult) (pediatric): Secondary | ICD-10-CM | POA: Insufficient documentation

## 2021-01-21 DIAGNOSIS — N2581 Secondary hyperparathyroidism of renal origin: Secondary | ICD-10-CM | POA: Insufficient documentation

## 2021-01-21 DIAGNOSIS — T829XXA Unspecified complication of cardiac and vascular prosthetic device, implant and graft, initial encounter: Secondary | ICD-10-CM | POA: Insufficient documentation

## 2021-01-21 DIAGNOSIS — N186 End stage renal disease: Secondary | ICD-10-CM | POA: Insufficient documentation

## 2021-01-21 DIAGNOSIS — K429 Umbilical hernia without obstruction or gangrene: Secondary | ICD-10-CM | POA: Insufficient documentation

## 2021-01-21 DIAGNOSIS — Z992 Dependence on renal dialysis: Secondary | ICD-10-CM | POA: Diagnosis not present

## 2021-01-21 DIAGNOSIS — I129 Hypertensive chronic kidney disease with stage 1 through stage 4 chronic kidney disease, or unspecified chronic kidney disease: Secondary | ICD-10-CM | POA: Diagnosis not present

## 2021-01-21 DIAGNOSIS — N189 Chronic kidney disease, unspecified: Secondary | ICD-10-CM | POA: Insufficient documentation

## 2021-01-21 DIAGNOSIS — H8113 Benign paroxysmal vertigo, bilateral: Secondary | ICD-10-CM | POA: Insufficient documentation

## 2021-01-21 DIAGNOSIS — T7840XA Allergy, unspecified, initial encounter: Secondary | ICD-10-CM | POA: Insufficient documentation

## 2021-01-21 DIAGNOSIS — Z9851 Tubal ligation status: Secondary | ICD-10-CM | POA: Insufficient documentation

## 2021-01-22 ENCOUNTER — Other Ambulatory Visit: Payer: Self-pay | Admitting: *Deleted

## 2021-01-22 DIAGNOSIS — N185 Chronic kidney disease, stage 5: Secondary | ICD-10-CM

## 2021-01-23 DIAGNOSIS — I129 Hypertensive chronic kidney disease with stage 1 through stage 4 chronic kidney disease, or unspecified chronic kidney disease: Secondary | ICD-10-CM | POA: Diagnosis not present

## 2021-01-23 DIAGNOSIS — D631 Anemia in chronic kidney disease: Secondary | ICD-10-CM | POA: Diagnosis not present

## 2021-01-23 DIAGNOSIS — I270 Primary pulmonary hypertension: Secondary | ICD-10-CM | POA: Diagnosis not present

## 2021-01-23 DIAGNOSIS — N2581 Secondary hyperparathyroidism of renal origin: Secondary | ICD-10-CM | POA: Diagnosis not present

## 2021-01-23 DIAGNOSIS — Z992 Dependence on renal dialysis: Secondary | ICD-10-CM | POA: Diagnosis not present

## 2021-01-23 DIAGNOSIS — N186 End stage renal disease: Secondary | ICD-10-CM | POA: Diagnosis not present

## 2021-01-24 DIAGNOSIS — N2581 Secondary hyperparathyroidism of renal origin: Secondary | ICD-10-CM | POA: Diagnosis not present

## 2021-01-24 DIAGNOSIS — D631 Anemia in chronic kidney disease: Secondary | ICD-10-CM | POA: Diagnosis not present

## 2021-01-24 DIAGNOSIS — N186 End stage renal disease: Secondary | ICD-10-CM | POA: Diagnosis not present

## 2021-01-24 DIAGNOSIS — I129 Hypertensive chronic kidney disease with stage 1 through stage 4 chronic kidney disease, or unspecified chronic kidney disease: Secondary | ICD-10-CM | POA: Diagnosis not present

## 2021-01-24 DIAGNOSIS — I270 Primary pulmonary hypertension: Secondary | ICD-10-CM | POA: Diagnosis not present

## 2021-01-24 DIAGNOSIS — Z992 Dependence on renal dialysis: Secondary | ICD-10-CM | POA: Diagnosis not present

## 2021-01-27 ENCOUNTER — Ambulatory Visit (HOSPITAL_COMMUNITY): Admission: RE | Admit: 2021-01-27 | Payer: Medicare PPO | Source: Ambulatory Visit

## 2021-01-27 ENCOUNTER — Other Ambulatory Visit: Payer: Self-pay

## 2021-01-27 ENCOUNTER — Ambulatory Visit (HOSPITAL_COMMUNITY)
Admission: RE | Admit: 2021-01-27 | Discharge: 2021-01-27 | Disposition: A | Discharge status: Home / Self Care | Payer: Medicare PPO | Source: Ambulatory Visit | Attending: Internal Medicine | Admitting: Internal Medicine

## 2021-01-27 DIAGNOSIS — I129 Hypertensive chronic kidney disease with stage 1 through stage 4 chronic kidney disease, or unspecified chronic kidney disease: Secondary | ICD-10-CM | POA: Diagnosis not present

## 2021-01-27 DIAGNOSIS — D631 Anemia in chronic kidney disease: Secondary | ICD-10-CM | POA: Diagnosis not present

## 2021-01-27 DIAGNOSIS — I5032 Chronic diastolic (congestive) heart failure: Secondary | ICD-10-CM

## 2021-01-27 DIAGNOSIS — N2581 Secondary hyperparathyroidism of renal origin: Secondary | ICD-10-CM | POA: Diagnosis not present

## 2021-01-27 DIAGNOSIS — Z992 Dependence on renal dialysis: Secondary | ICD-10-CM | POA: Diagnosis not present

## 2021-01-27 DIAGNOSIS — N186 End stage renal disease: Secondary | ICD-10-CM | POA: Diagnosis not present

## 2021-01-27 DIAGNOSIS — I270 Primary pulmonary hypertension: Secondary | ICD-10-CM | POA: Diagnosis not present

## 2021-01-27 LAB — BASIC METABOLIC PANEL
Anion gap: 12 (ref 5–15)
BUN: 18 mg/dL (ref 8–23)
CO2: 34 mmol/L — ABNORMAL HIGH (ref 22–32)
Calcium: 8.3 mg/dL — ABNORMAL LOW (ref 8.9–10.3)
Chloride: 91 mmol/L — ABNORMAL LOW (ref 98–111)
Creatinine, Ser: 2.8 mg/dL — ABNORMAL HIGH (ref 0.44–1.00)
GFR, Estimated: 17 mL/min — ABNORMAL LOW (ref >60)
Glucose, Bld: 101 mg/dL — ABNORMAL HIGH (ref 70–99)
Potassium: 3.3 mmol/L — ABNORMAL LOW (ref 3.5–5.1)
Sodium: 137 mmol/L (ref 135–145)

## 2021-01-28 DIAGNOSIS — N2581 Secondary hyperparathyroidism of renal origin: Secondary | ICD-10-CM | POA: Diagnosis not present

## 2021-01-28 DIAGNOSIS — Z992 Dependence on renal dialysis: Secondary | ICD-10-CM | POA: Diagnosis not present

## 2021-01-28 DIAGNOSIS — I270 Primary pulmonary hypertension: Secondary | ICD-10-CM | POA: Diagnosis not present

## 2021-01-28 DIAGNOSIS — N186 End stage renal disease: Secondary | ICD-10-CM | POA: Diagnosis not present

## 2021-01-28 DIAGNOSIS — D631 Anemia in chronic kidney disease: Secondary | ICD-10-CM | POA: Diagnosis not present

## 2021-01-28 DIAGNOSIS — I129 Hypertensive chronic kidney disease with stage 1 through stage 4 chronic kidney disease, or unspecified chronic kidney disease: Secondary | ICD-10-CM | POA: Diagnosis not present

## 2021-01-29 ENCOUNTER — Ambulatory Visit (HOSPITAL_COMMUNITY)
Admission: RE | Admit: 2021-01-29 | Discharge: 2021-01-29 | Disposition: A | Discharge status: Home / Self Care | Payer: Medicare PPO | Source: Ambulatory Visit | Attending: Vascular Surgery | Admitting: Vascular Surgery

## 2021-01-29 ENCOUNTER — Other Ambulatory Visit: Payer: Self-pay

## 2021-01-29 ENCOUNTER — Ambulatory Visit (INDEPENDENT_AMBULATORY_CARE_PROVIDER_SITE_OTHER): Payer: Medicare PPO | Admitting: Physician Assistant

## 2021-01-29 ENCOUNTER — Encounter: Payer: Self-pay | Admitting: Physician Assistant

## 2021-01-29 VITALS — BP 73/43 | HR 76 | Temp 98.1°F | Resp 20 | Ht 60.0 in | Wt 128.0 lb

## 2021-01-29 DIAGNOSIS — N186 End stage renal disease: Secondary | ICD-10-CM

## 2021-01-29 DIAGNOSIS — Z992 Dependence on renal dialysis: Secondary | ICD-10-CM

## 2021-01-29 DIAGNOSIS — N185 Chronic kidney disease, stage 5: Secondary | ICD-10-CM | POA: Insufficient documentation

## 2021-01-29 NOTE — Progress Notes (Signed)
Office Note     CC:  follow up Requesting Provider:  [name], *  HPI: Tiffany [name] is a 75 y.o. (06-06-1946) female who presents status post left first stage basilic vein fistula by Dr. Carlis Abbott on 12/23/2020.  She states the incision has healed well.  She denies any signs or symptoms of steal syndrome in her left hand.  Since surgery she has been started on home O2 as well as hemodialysis.  She is dialyzing via right IJ TDC.  She is seen by Dr. [name] for management of pulmonary artery hypertension.  She states her breathing has improved since initiation of hemodialysis however she is still requiring 3 L O2 around-the-clock.  She is scheduled to see Dr. Algernon Huxley again in about 10 days.  She is currently dialyzing on a Monday, Tuesday, Thursday, Friday schedule.  She is also on Eliquis for atrial fibrillation.  Past Medical History:  Diagnosis Date   Anemia    Arthritis 04-20-12   Osteoarthritis-right hip   Atrial fibrillation status post cardioversion, 01/30/15 maintaining SR.  01/31/2015   a. 01/2015 s/p TEE/DCCV;  b. 02/06/2015 recurrent AF noted, amio added;  c. CHA2DS2VASc = 3-->eliquis.   BENIGN POSITIONAL VERTIGO    Cataract    CHF (congestive heart failure) (HCC)    Chronic kidney disease    Complication of anesthesia 04-20-12   some issues with prolonged sedation after anesthesia   Diverticulosis    DYSLIPIDEMIA    Dyspnea    Dysrhythmia    Esophageal stricture    GERD    HYPERTENSION    a. severe, pt intol of med tx, Pt. has severe "whitecoat" syndrome and refused medical therapy.   Hypothyroidism 09/29/2014   a. pt did not tolerate synthroid and this was subsequently discontinued.   Mild LV dysfunction    a. 01/2015 Echo: EF 45-50%, mild MR, mildly dil LA, mod dil RV with mod to sev reduced fxn, mod-sev TR, PASP 24mHg.   Osteopenia    Pulmonary hypertension (HCC)    Raynaud disease    CREST   Sleep apnea    URINARY INCONTINENCE 04-20-12   occ. with nighttime  sleep pattern    Past Surgical History:  Procedure Laterality Date   ATRIAL FIBRILLATION ABLATION N/A 01/26/2017   Procedure: Atrial Fibrillation Ablation;  Surgeon: AThompson Grayer MD;  [facility];  Service: Cardiovascular;  Laterality: N/A;   BASCILIC VEIN TRANSPOSITION Left 12/23/2020   Procedure: LEFT FIRST STAGE [place]  Surgeon: CMarty Heck MD;  [facility]  Service: Vascular;  Laterality: Left;   breast ultrasound Right 09/13/13   There is no sonographic evidence of malignancy. the [id number] cyst in (R) breast is consistent with a benign finding. repeat in 1 year   CARDIAC CATHETERIZATION N/A 08/29/2015   Procedure: Right Heart Cath;  Surgeon: DLarey Dresser MD;  [facility];  Service: Cardiovascular;  Laterality: N/A;   CARDIOVERSION N/A 01/30/2015   Procedure: CARDIOVERSION;  Surgeon: MSanda Klein MD;  [facility];  Service: Cardiovascular;  Laterality: N/A;   CATARACT EXTRACTION, BILATERAL     CHOLECYSTECTOMY     '90-"sludge"   NASAL FRACTURE SURGERY  2006   RIGHT HEART CATH N/A 06/30/2017   Procedure: RIGHT HEART CATH;  Surgeon: MLarey Dresser MD;  [facility];  Service: Cardiovascular;  Laterality: N/A;   RIGHT HEART CATH N/A 08/09/2020   Procedure: RIGHT HEART CATH;  Surgeon:  Larey Dresser, MD;  [facility];  Service: Cardiovascular;  Laterality: N/A;   TEE WITHOUT CARDIOVERSION N/A 01/30/2015   Procedure: TRANSESOPHAGEAL ECHOCARDIOGRAM (TEE);  Surgeon: Sanda Klein, MD;  Location: [facility];  Service: Cardiovascular;  Laterality: N/A;   TEE WITHOUT CARDIOVERSION N/A 01/25/2017   Procedure: TRANSESOPHAGEAL ECHOCARDIOGRAM (TEE);  Surgeon: Fay Records, MD;  Location: [facility];  Service: Cardiovascular;  Laterality: N/A;   TOTAL HIP ARTHROPLASTY  04/26/2012   Procedure: TOTAL HIP ARTHROPLASTY ANTERIOR APPROACH;  Surgeon: Mauri Pole, MD;   Location: [facility];  Service: Orthopedics;  Laterality: Right;   TUBAL LIGATION  1980    Social History   Socioeconomic History   Marital status: Married    Spouse name: [name]   Number of children: 2   Years of education: Not on file   Highest education level: Not on file  Occupational History   Occupation: retired  Tobacco Use   Smoking status: Never   Smokeless tobacco: Never  Vaping Use   Vaping Use: Never used  Substance and Sexual Activity   Alcohol use: Yes    Comment: less than 1 drink per week   Drug use: No   Sexual activity: Yes  Other Topics Concern   Not on file  Social History Narrative   She enjoys birding.  Lives at home with husband.   Married.   retired Quarry manager, Tax inspector   Social Determinants of Radio broadcast assistant Strain: Not on [employer] Insecurity: Not on file  Transportation Needs: Not on file  Physical Activity: Not on file  Stress: Not on file  Social Connections: Not on file  Intimate Partner Violence: Not on file    Family History  Adopted: Yes  Problem Relation Age of Onset   Other Other        patient was adopted    Current Outpatient Medications  Medication Sig Dispense Refill   acetaminophen (TYLENOL) 325 MG tablet Take 2 tablets (650 mg total) by mouth every 4 (four) hours as needed for headache or mild pain.     ELIQUIS 5 MG TABS tablet TAKE 1 TABLET BY MOUTH TWICE A DAY 180 tablet 1   famotidine (PEPCID) 20 MG tablet TAKE 1 TABLET BY MOUTH TWICE A DAY 180 tablet 3   flecainide (TAMBOCOR) 50 MG tablet TAKE 1 TABLET BY MOUTH TWICE A DAY 180 tablet 2   fluticasone (FLONASE) 50 MCG/ACT nasal spray Place 1 spray daily as needed into both nostrils for allergies or rhinitis.     iron sucrose in sodium chloride 0.9 % 100 mL Iron Sucrose (Venofer)     macitentan (OPSUMIT) 10 MG tablet Take 1 tablet (10 mg total) by mouth daily. 30 tablet 11   Probiotic Product (PROBIOTIC-10) CAPS Take 1 capsule by  mouth 3 (three) times a week.      Probiotic, Lactobacillus, CAPS Take by mouth.     Riociguat (ADEMPAS) 2.5 MG TABS Take 2.5 mg by mouth 3 (three) times daily. 90 tablet 11   Selexipag (UPTRAVI) 1200 MCG TABS Take 2 tablets by mouth in the morning and at bedtime. 120 tablet 11   No current facility-administered medications for this visit.    Allergies  Allergen Reactions   Levothyroxine Shortness Of Breath and Other (See Comments)    Exhaustion also - Per pt her this medication could have not been the reason for her symptoms   Statins Other (See Comments)    "  Pain, weakness and kidney problems"   Prednisone Other (See Comments)    Severe insomnia   Penicillins Other (See Comments)    dry mouth Has patient had a PCN reaction causing immediate rash, facial/tongue/throat swelling, SOB or lightheadedness with hypotension: No Has patient had a PCN reaction causing severe rash involving mucus membranes or skin necrosis: No Has patient had a PCN reaction that required hospitalization No Has patient had a PCN reaction occurring within the last 10 years: No If all of the above answers are "NO", then may proceed with Cephalosporin use.      REVIEW OF SYSTEMS:   '[X]'$  denotes positive finding, '[ ]'$  denotes negative finding Cardiac  Comments:  Chest pain or chest pressure:    Shortness of breath upon exertion:    Short of breath when lying flat:    Irregular heart rhythm:        Vascular    Pain in calf, thigh, or hip brought on by ambulation:    Pain in feet at night that wakes you up from your sleep:     Blood clot in your veins:    Leg swelling:         Pulmonary    Oxygen at home:    Productive cough:     Wheezing:         Neurologic    Sudden weakness in arms or legs:     Sudden numbness in arms or legs:     Sudden onset of difficulty speaking or slurred speech:    Temporary loss of vision in one eye:     Problems with dizziness:         Gastrointestinal    Blood in  stool:     Vomited blood:         Genitourinary    Burning when urinating:     Blood in urine:        Psychiatric    Major depression:         Hematologic    Bleeding problems:    Problems with blood clotting too easily:        Skin    Rashes or ulcers:        Constitutional    Fever or chills:      PHYSICAL EXAMINATION:  Vitals:   01/29/21 1444  BP: (!) 73/43  Pulse: 76  Resp: 20  Temp: 98.1 F (36.7 C)  TempSrc: Temporal  SpO2: 91%  Weight: 128 lb (58.1 kg)  Height: 5' (1.524 m)    General:  WDWN in NAD; vital signs documented above Gait: Not observed HENT: WNL, normocephalic Pulmonary: normal non-labored breathing on O2 by [place] Cardiac: regular HR Abdomen: soft, NT, no masses Skin: without rashes Vascular Exam/Pulses:  Right Left  Radial 2+ (normal) 2+ (normal)   Extremities: without ischemic changes, without Gangrene , without cellulitis; without open wounds; pitting edema of BLE to the level of mid shin Musculoskeletal: no muscle wasting or atrophy  Neurologic: A&O X 3;  No focal weakness or paresthesias are detected Psychiatric:  The pt has Normal affect.   Non-Invasive Vascular Imaging:   Patent left arm fistula about 5 mm in diameter    ASSESSMENT/PLAN:: 75 y.o. female here for follow up status post left first stage basilic vein fistula creation by Dr. Carlis Abbott  -Patent basilic vein fistula with palpable thrill and no signs or symptoms of steal syndrome -Based on duplex as well as physical exam, fistula is mature enough to  proceed with second stage transposition -She will be seen as scheduled by Dr. [name] in the next 1 to 2 weeks.  We will also reach out to his office to see if we can proceed with left arm second stage basilic vein transposition with acceptable risk. -Surgery was discussed with the patient and all questions were answered.  She is agreeable to proceed with the above named procedure.  We will also give her a surgery date and time for  about 3 to 4 weeks from now.   Dagoberto Ligas, PA-C [facility] [phone]  Clinic MD:   Scot Dock

## 2021-01-30 DIAGNOSIS — D631 Anemia in chronic kidney disease: Secondary | ICD-10-CM | POA: Diagnosis not present

## 2021-01-30 DIAGNOSIS — I129 Hypertensive chronic kidney disease with stage 1 through stage 4 chronic kidney disease, or unspecified chronic kidney disease: Secondary | ICD-10-CM | POA: Diagnosis not present

## 2021-01-30 DIAGNOSIS — I270 Primary pulmonary hypertension: Secondary | ICD-10-CM | POA: Diagnosis not present

## 2021-01-30 DIAGNOSIS — N2581 Secondary hyperparathyroidism of renal origin: Secondary | ICD-10-CM | POA: Diagnosis not present

## 2021-01-30 DIAGNOSIS — Z992 Dependence on renal dialysis: Secondary | ICD-10-CM | POA: Diagnosis not present

## 2021-01-30 DIAGNOSIS — N186 End stage renal disease: Secondary | ICD-10-CM | POA: Diagnosis not present

## 2021-01-31 DIAGNOSIS — N186 End stage renal disease: Secondary | ICD-10-CM | POA: Diagnosis not present

## 2021-01-31 DIAGNOSIS — Z992 Dependence on renal dialysis: Secondary | ICD-10-CM | POA: Diagnosis not present

## 2021-01-31 DIAGNOSIS — D631 Anemia in chronic kidney disease: Secondary | ICD-10-CM | POA: Diagnosis not present

## 2021-01-31 DIAGNOSIS — N2581 Secondary hyperparathyroidism of renal origin: Secondary | ICD-10-CM | POA: Diagnosis not present

## 2021-01-31 DIAGNOSIS — I129 Hypertensive chronic kidney disease with stage 1 through stage 4 chronic kidney disease, or unspecified chronic kidney disease: Secondary | ICD-10-CM | POA: Diagnosis not present

## 2021-01-31 DIAGNOSIS — I270 Primary pulmonary hypertension: Secondary | ICD-10-CM | POA: Diagnosis not present

## 2021-02-03 DIAGNOSIS — N186 End stage renal disease: Secondary | ICD-10-CM | POA: Diagnosis not present

## 2021-02-03 DIAGNOSIS — N2581 Secondary hyperparathyroidism of renal origin: Secondary | ICD-10-CM | POA: Diagnosis not present

## 2021-02-03 DIAGNOSIS — D631 Anemia in chronic kidney disease: Secondary | ICD-10-CM | POA: Diagnosis not present

## 2021-02-03 DIAGNOSIS — I270 Primary pulmonary hypertension: Secondary | ICD-10-CM | POA: Diagnosis not present

## 2021-02-03 DIAGNOSIS — Z992 Dependence on renal dialysis: Secondary | ICD-10-CM | POA: Diagnosis not present

## 2021-02-03 DIAGNOSIS — I129 Hypertensive chronic kidney disease with stage 1 through stage 4 chronic kidney disease, or unspecified chronic kidney disease: Secondary | ICD-10-CM | POA: Diagnosis not present

## 2021-02-04 DIAGNOSIS — N186 End stage renal disease: Secondary | ICD-10-CM | POA: Diagnosis not present

## 2021-02-04 DIAGNOSIS — D631 Anemia in chronic kidney disease: Secondary | ICD-10-CM | POA: Diagnosis not present

## 2021-02-04 DIAGNOSIS — N2581 Secondary hyperparathyroidism of renal origin: Secondary | ICD-10-CM | POA: Diagnosis not present

## 2021-02-04 DIAGNOSIS — I270 Primary pulmonary hypertension: Secondary | ICD-10-CM | POA: Diagnosis not present

## 2021-02-04 DIAGNOSIS — Z992 Dependence on renal dialysis: Secondary | ICD-10-CM | POA: Diagnosis not present

## 2021-02-04 DIAGNOSIS — I129 Hypertensive chronic kidney disease with stage 1 through stage 4 chronic kidney disease, or unspecified chronic kidney disease: Secondary | ICD-10-CM | POA: Diagnosis not present

## 2021-02-06 DIAGNOSIS — N186 End stage renal disease: Secondary | ICD-10-CM | POA: Diagnosis not present

## 2021-02-06 DIAGNOSIS — D631 Anemia in chronic kidney disease: Secondary | ICD-10-CM | POA: Diagnosis not present

## 2021-02-06 DIAGNOSIS — N2581 Secondary hyperparathyroidism of renal origin: Secondary | ICD-10-CM | POA: Diagnosis not present

## 2021-02-06 DIAGNOSIS — Z992 Dependence on renal dialysis: Secondary | ICD-10-CM | POA: Diagnosis not present

## 2021-02-06 DIAGNOSIS — I129 Hypertensive chronic kidney disease with stage 1 through stage 4 chronic kidney disease, or unspecified chronic kidney disease: Secondary | ICD-10-CM | POA: Diagnosis not present

## 2021-02-06 DIAGNOSIS — I270 Primary pulmonary hypertension: Secondary | ICD-10-CM | POA: Diagnosis not present

## 2021-02-06 NOTE — Progress Notes (Signed)
Patient ID: Tiffany [name], female   DOB: Jan 01, 1946, 75 y.o.   MRN: [id number] PCP: Dr. Sharlet Salina HF Cardiology: Dr. [name]  75 y.o. with history of paroxysmal atrial fibrillation and diastolic CHF was noted to have significant pulmonary hypertension and exertional dyspnea.  She has history of paroxysmal atrial fibrillation and had an episode in 6/16 requiring cardioversion.  She was started on amiodarone but this was stopped with worsening breathing.  Cardiolite in 9/16 showed on ischemia or infarction.     I had her do a RHC in 1/17.  This showed elevated left and right heart filling pressures but also PAH. After cath, she increased her Lasix from 40 qod to 40 daily.  At a prior appointment, I increased Lasix to 40 mg bid and also started her on Opsumit.  At next appointment, I added Adcirca 20 and then titrated it up to 40 mg daily. She continue to be volume overloaded, so  I increased Lasix to 80 qam/40 qpm. She has seemed to do well on this dose.   Echo was done in 6/17, RV mildly dilated with moderately decreased systolic function and PASP 85 mmHg.    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   She had an atrial fibrillation ablation in 6/18.   RHC in 11/18 showed lower PA pressure than in the past with PA 54/16 and PVR 3.7 WU.  Echo showed preserved LV systolic function and normal RV size and function.  Holter showed PACs and PVCs, no atrial fibrillation.  I started her on Toprol XL.  CT chest did not show any significant lung findings. Echo in 12/19 showed EF 65-70%, moderate diastolic dysfunction, mild AS, mild MR, PASP 47 mmHg, normal RV.   She was started on flecainide for paroxysmal atrial fibrillation and has not had symptomatic atrial fibrillation for months now.   Echo in 1/21 showed EF 60-65%, moderately dilated RV with normal systolic function, mild-moderate TR with PASP 88 mmHg, IVC normal.   RHC in 1/22 showed normal filling pressures but still with  severe pulmonary hypertension.  Cardiac output was preserved.  Echo in 1/22 showed EF 55-60%, moderate RV dilation with moderately decreased systolic function, moderate TR, PASP 117 mmHg with dilated IVC.    She returned 5/22 for followup of RV failure and pulmonary hypertension.  Creatinine has been steadily trending up.  She is followed by nephrology and is making plans for eventual HD.  She is not a candidate for PD catheter placement due to ventral hernias. She recently had AV fistula placed.  She is feeling worse, she has chronic itching and altered taste.  She is starting to get sleepy and foggy.  She is now on home oxygen but is still short of breath with mild exertion around the house.  Weight is up another 7 lbs. Diuretics increased.   Today she returns for HF follow up and for surgical clearance for upcoming left arm second stage basilic vein transposition. Overall feeling ok, still SOB with minimal activity. Walks around house, some dizzy spells. She is now on HD M/T/Th/Fri. Denies increasing SOB, CP, dizziness, edema, or PND/Orthopnea. Appetite ok. No fever or chills. Weight at home stable. Taking all medications. Urinating a little bit daily.  ECG (personally reviewed): NSR, iRBBB.  6 minute walk (2/17): 141 m 6 minute walk (3/17): 182 m 6 minute walk (5/17): 229 m 6 minute walk (10/17): 305 m 6 minute walk (5/18): 256 m 6 minute walk (  8/18): 427 m 6 minute walk (11/18): 61 m, oxygen saturation dropped as low as 70s.  6 minute walk (3/19): 260 m 6 minute walk (6/19): 372 m 6 minute walk (12/19): 317 m 6 minute walk (4/21): 305 m 6 minute walk (11/21): 243 m  Labs (12/16): BNP 1187, ANA 1:640, TSH elevated. Labs (2/17): K 5.4 => 3.9, creatinine 1.78 => 1.66, LDL 112, BNP 880 Labs (4/17): K 4.5, creatinine 1.76 Labs (5/17): K 4.1, creatinine 1.79 Labs (8/17): K 3.1, creatinine 1.7, BNP 404 Labs (9/17): K 3.9, creatinine 1.8, BNP 512 Labs (2/18): K 3.5, creatinine 1.71, BNP  157 Labs (5/18): LDL 120 Labs (6/18): K 3.6, creatinine 1.85 Labs (8/18): K 3.5, creatinine 1.78, BNP 88 Labs (11/18): K 3.6 => 3.2, creatinine 1.84 => 1.7, hgb 12.7 => 11.2. Labs (3/19): K 3.8, creatinine 1.74, LDL 123, HDL 56, TSH normal Labs (6/19): K 3.5, creatinine 1.91 Labs (9/19): K 3.5, creatinine 1.9 Labs (1/21): K 3.9, creatinine 2.44 Labs (4/21): K 3.8, creatinine 2.59, BNP 268 Labs (5/21): K 3.5, creatinine 2.9 Labs (9/21): K 4.4, creatinine 2.7, hgb 11 Labs (11/21): TSH normal Labs (12/21): hgb 10.1, K 4.2, creatinine 2.88 Labs (1/22): K 4.2, creatinine 3.22 Labs (3/22): K 4.4, creatinine 3.5 => 2.9, hgb 11.3, TSH normal Labs (4/22): K 4.6, creatinine 3.37 Labs (5/22): K 3.5, creatinine 4.75 Labs (6/22): K 3.3, creatinine 2.80  PMH:  1. CKD stage III 2. Bradycardia in setting of metoprolol + amiodarone use.  3. Raynauds phenomenon. 4. BPPV 5. OA right hip 6. Hyperthyroidism 7. Atrial fibrillation: Paroxysmal.  She was on amiodarone in the past but this was stopped when she became more short of breath.  TEE-guided DCCV in 6/16.  - Atrial fibrillation ablation 6/18.  - Holter (12/18) with PACs/PVCs, no atrial fibrillation.  8. HTN 9. 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 - TEE (6/16) with EF 45-50%, mildly dilated RV with mildly decreased systolic function, PASP 36 mmHg.  - RHC (1/17) with mean RA 9, PA 80/29 mean 52, mean PCWP 27, CI 1.97 Fick/1.8 thermo, PVR 7.6 Fick/8.4 thermo.  - Echo (6/17): EF 60-65%, mild MR, mild RV dilation with moderately decreased systolic function, moderate TR, PASP 85 mmHg.  - Echo (6/18): EF [id number], normal diastolic function, normal RV size and systolic function, PASP 36 mmHg. - Echo (12/18): EF 60-65%, grade II diastolic dysfunction, mild MR, normal RV size and systolic function, PASP 52 mmHg.   - RHC (11/18): mean RA 2, PA 54/16 mean 32, mean PCWP 9, CI 3.83, PVR 3.7 WU.  - Echo (12/19): EF 65-70%, moderate diastolic dysfunction, mild AS, mild MR, PASP 47 mmHg.  - Echo (1/21): EF 60-65%, moderately dilated RV with normal systolic function, mild-moderate TR with PASP 88 mmHg, IVC normal.  - Echo (1/22): EF 55-60%, moderate RV dilation with moderately decreased systolic function, moderate TR, PASP 117 mmHg with dilated IVC.   - RHC (1/22): mean RA 4, PA 87/26 mean 47, mean PCWP 9, CI 4.36 Fick, CI 3.61 thermo, PVR 5.6.  10. Pulmonary hypertension: See RHC above.  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  - TEE (6/16) with mildly dilated RV with mildly decreased systolic function. - ANA 0000000, anti-centromere antibody elevated, anti-SCL-70 antibody negative, h/o Raynauds - PFTs (7/16) with minimal obstructive defect but moderately decreased DLCO.  - V/Q scan (2/17) negative  for acute or chronic PE.  11. Cardiolite (9/16) with no ischemia/infarction.  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  13. OSA: Severe on 9/17 sleep study. Now using CPAP.  14. GERD 15. Thyroid nodule: right nodule seen on 3/19 [place].  16. Fe deficiency anemia.  17. Umbilical hernia 18. H/o partial SBO 19. Pancreatic lesion: Indeterminant, followed by GI.   SH: Married, lives in [place], no smoking, no ETOH.   FH: Adopted.  Daughter has Raynaud's.  ROS: All systems reviewed and negative except as per HPI  Current Outpatient Medications  Medication Sig Dispense Refill   acetaminophen (TYLENOL) 325 MG tablet Take 2 tablets (650 mg total) by mouth every 4 (four) hours as needed for headache or mild pain.     ELIQUIS 5 MG TABS tablet TAKE 1 TABLET BY MOUTH TWICE A DAY 180 tablet 1   famotidine (PEPCID) 20 MG tablet TAKE 1 TABLET BY MOUTH TWICE A DAY 180 tablet 3   flecainide (TAMBOCOR) 50 MG tablet TAKE 1 TABLET BY MOUTH TWICE A DAY 180 tablet 2   fluticasone (FLONASE) 50 MCG/ACT nasal spray Place 1 spray daily  as needed into both nostrils for allergies or rhinitis.     macitentan (OPSUMIT) 10 MG tablet Take 1 tablet (10 mg total) by mouth daily. 30 tablet 11   Probiotic Product (PROBIOTIC-10) CAPS Take 1 capsule by mouth 3 (three) times a week.      Riociguat (ADEMPAS) 2.5 MG TABS Take 2.5 mg by mouth 2 (two) times daily.     Selexipag (UPTRAVI) 1200 MCG TABS Take 2 tablets by mouth in the morning and at bedtime. 120 tablet 11   No current facility-administered medications for this encounter.   Wt Readings from Last 3 Encounters:  02/07/21 61.7 kg (136 lb)  01/29/21 58.1 kg (128 lb)  01/15/21 63 kg (139 lb)   BP (!) 84/50   Pulse 69   Wt 61.7 kg (136 lb)   SpO2 96%   BMI 26.56 kg/m    General:  NAD. No resp difficulty, on oxygen arrived in wheelchair. HEENT: Normal Neck: Supple. No JVD. Carotids 2+ bilat; no bruits. No lymphadenopathy or thryomegaly appreciated. Cor: PMI nondisplaced. Regular rate & rhythm. No rubs, gallops , II/VI SEM RUSB Lungs: Clear, Right upper chest TDC Abdomen: Soft, nontender, nondistended. No hepatosplenomegaly. No bruits or masses. Good bowel sounds. Extremities: No cyanosis, clubbing, rash, 1+ LE to knees; L arm fistula +/+ Neuro: Alert & oriented x 3, cranial nerves grossly intact. Moves all 4 extremities w/o difficulty. Affect pleasant.  Assessment/Plan: 1. Pulmonary hypertension: Patient has pulmonary arterial hypertension.  She appears to have co-existing diastolic LV dysfunction given elevated PCWP on prior RHC in 1/17.  PFTs did not show significant obstruction, they only showed moderately decreased DLCO consistent with pulmonary vascular disease. V/Q scan did not show evidence for chronic PE.  PVR 7.6 WU by Fick and 8.4 WU by thermodiluation on 1/17 RHC.  Cardiac index was low, 1.97 Fick/1.8 thermo. 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  ANA 1:640 with elevated anti-centromere antibody and Raynaud's phenomenon, suspect PAH related to rheumatological disease,  incomplete CREST syndrome per Dr Elmon Else last note.  Given worsening symptoms, RHC was repeated in 11/18.  This showed PVR 3.7 WU with mean PA pressure 32 and normal CI.  RHC done in 1/22 showed normal filling pressures and cardiac output but still with severe pulmonary hypertension.   Echo in 1/22 showed normal LV systolic function but moderately  dilated and dysfunctional RV.   - She is now on home oxygen 3L by [place].  - Continue riociguat 2.5 mg tid. OK to take after HD. - She has sleep apnea, now using CPAP.  - Continue Opsumit.  - She has tolerated increase in Uptravi to 2400 mcg bid.  2. Chronic diastolic CHF/RV failure: Elevated PCWP on 1/17 RHC.  NYHA class IV symptoms, weight is up and she is markedly volume overloaded on exam.  She is not volume overloaded today, going for HD this afternoon. NYHA III-IIIb symptoms. - Volume now being managed by HD. She is oliguric. - Continue home oxygen.  3. Atrial fibrillation: Paroxysmal.  She is in NSR today.  She had atrial fibrillation ablation in 6/18. She is now on flecainide.  - Continue Toprol XL 25 mg daily.  - Continue apixaban. No bleeding issues. - Continue flecainide.  4. ESRD: ?If scleroderma plays a role.  She is seeing nephrology. PD was explored, but she has extensive ventral hernias and does not seem to be a good candidate for PD catheter placement.  Surgical repair of the hernias under general anesthesia would be high risk given her severe PH.  Recent intractable volume overload and uremic symptoms.  She is now on iHD. - Recent BMET stable. - Will add midodrine 5 mg tid to allow for better volume removal during HD instead of decreasing her beta blocker. 5. CREST syndrome: Seeing Dr. Amil Amen.  6. Surgical Clearance: She is at a moderate risk for surgery due to her CHF and pulmonary hypertension.  Followup in 4-6 weeks with Dr. Wynema Birch [facility] FNP 02/07/2021

## 2021-02-07 ENCOUNTER — Encounter (HOSPITAL_COMMUNITY): Payer: Self-pay

## 2021-02-07 ENCOUNTER — Ambulatory Visit (HOSPITAL_COMMUNITY)
Admission: RE | Admit: 2021-02-07 | Discharge: 2021-02-07 | Disposition: A | Discharge status: Home / Self Care | Payer: Medicare PPO | Source: Ambulatory Visit | Attending: Family Medicine | Admitting: Family Medicine

## 2021-02-07 ENCOUNTER — Other Ambulatory Visit: Payer: Self-pay

## 2021-02-07 VITALS — BP 84/50 | HR 69 | Wt 136.0 lb

## 2021-02-07 DIAGNOSIS — I5032 Chronic diastolic (congestive) heart failure: Secondary | ICD-10-CM | POA: Diagnosis not present

## 2021-02-07 DIAGNOSIS — I2721 Secondary pulmonary arterial hypertension: Secondary | ICD-10-CM | POA: Insufficient documentation

## 2021-02-07 DIAGNOSIS — M341 CR(E)ST syndrome: Secondary | ICD-10-CM | POA: Diagnosis not present

## 2021-02-07 DIAGNOSIS — N183 Chronic kidney disease, stage 3 unspecified: Secondary | ICD-10-CM | POA: Insufficient documentation

## 2021-02-07 DIAGNOSIS — Z9989 Dependence on other enabling machines and devices: Secondary | ICD-10-CM | POA: Diagnosis not present

## 2021-02-07 DIAGNOSIS — N186 End stage renal disease: Secondary | ICD-10-CM

## 2021-02-07 DIAGNOSIS — Z992 Dependence on renal dialysis: Secondary | ICD-10-CM

## 2021-02-07 DIAGNOSIS — I132 Hypertensive heart and chronic kidney disease with heart failure and with stage 5 chronic kidney disease, or end stage renal disease: Secondary | ICD-10-CM | POA: Insufficient documentation

## 2021-02-07 DIAGNOSIS — G4733 Obstructive sleep apnea (adult) (pediatric): Secondary | ICD-10-CM

## 2021-02-07 DIAGNOSIS — I272 Pulmonary hypertension, unspecified: Secondary | ICD-10-CM | POA: Diagnosis not present

## 2021-02-07 DIAGNOSIS — I480 Paroxysmal atrial fibrillation: Secondary | ICD-10-CM

## 2021-02-07 DIAGNOSIS — G4730 Sleep apnea, unspecified: Secondary | ICD-10-CM | POA: Insufficient documentation

## 2021-02-07 DIAGNOSIS — N2581 Secondary hyperparathyroidism of renal origin: Secondary | ICD-10-CM | POA: Diagnosis not present

## 2021-02-07 DIAGNOSIS — I270 Primary pulmonary hypertension: Secondary | ICD-10-CM | POA: Diagnosis not present

## 2021-02-07 DIAGNOSIS — D631 Anemia in chronic kidney disease: Secondary | ICD-10-CM | POA: Diagnosis not present

## 2021-02-07 DIAGNOSIS — I129 Hypertensive chronic kidney disease with stage 1 through stage 4 chronic kidney disease, or unspecified chronic kidney disease: Secondary | ICD-10-CM | POA: Diagnosis not present

## 2021-02-07 DIAGNOSIS — Z7901 Long term (current) use of anticoagulants: Secondary | ICD-10-CM | POA: Diagnosis not present

## 2021-02-07 MED ORDER — MIDODRINE HCL 5 MG PO TABS: 5.0000 mg | ORAL_TABLET | Freq: Three times a day (TID) | ORAL | 3 refills | Status: DC

## 2021-02-07 NOTE — Patient Instructions (Signed)
START Midodrine 5 mg, one tab three times daily   Your physician recommends that you schedule a follow-up appointment in: 6 weeks with Dr [name]  Do the following things EVERYDAY: Weigh yourself in the morning before breakfast. Write it down and keep it in a log. Take your medicines as prescribed Eat low salt foods--Limit salt (sodium) to 2000 mg per day.  Stay as active as you can everyday Limit all fluids for the day to less than 2 liters   At the [facility], you and your health needs are our priority. As part of our continuing mission to provide you with exceptional heart care, we have created designated Provider Care Teams. These Care Teams include your primary Cardiologist (physician) and Advanced Practice Providers (APPs- Physician Assistants and Nurse Practitioners) who all work together to provide you with the care you need, when you need it.   You may see any of the following providers on your designated Care Team at your next follow up: Dr Glori Bickers Dr Loralie Champagne Dr Patrice Paradise, NP Lyda Jester, [place] Ginnie Smart Audry Riles, PharmD   Please be sure to bring in all your medications bottles to every appointment.   If you have any questions or concerns before your next appointment please send [place] a message through [place] or call our office at [phone].    TO LEAVE A MESSAGE FOR THE NURSE SELECT OPTION 2, PLEASE LEAVE A MESSAGE INCLUDING: YOUR NAME DATE OF BIRTH CALL BACK NUMBER REASON FOR CALL**this is important as we prioritize the call backs  YOU WILL RECEIVE A CALL BACK THE SAME DAY AS LONG AS YOU CALL BEFORE 4:00 PM

## 2021-02-10 ENCOUNTER — Telehealth: Payer: Self-pay | Admitting: Cardiovascular Disease

## 2021-02-10 DIAGNOSIS — N2581 Secondary hyperparathyroidism of renal origin: Secondary | ICD-10-CM | POA: Diagnosis not present

## 2021-02-10 DIAGNOSIS — Z992 Dependence on renal dialysis: Secondary | ICD-10-CM | POA: Diagnosis not present

## 2021-02-10 DIAGNOSIS — I129 Hypertensive chronic kidney disease with stage 1 through stage 4 chronic kidney disease, or unspecified chronic kidney disease: Secondary | ICD-10-CM | POA: Diagnosis not present

## 2021-02-10 DIAGNOSIS — N186 End stage renal disease: Secondary | ICD-10-CM | POA: Diagnosis not present

## 2021-02-10 DIAGNOSIS — I270 Primary pulmonary hypertension: Secondary | ICD-10-CM | POA: Diagnosis not present

## 2021-02-10 DIAGNOSIS — D631 Anemia in chronic kidney disease: Secondary | ICD-10-CM | POA: Diagnosis not present

## 2021-02-10 NOTE — Telephone Encounter (Signed)
Patient states she was offered a smaller nose piece for her CPAP, but Dr. Claiborne Billings, but at the time did not need it. She states now she has an open sore on her nostril and would like to know if she can now get the  smaller, more comfortable nose piece. She  states she has dialysis in less than an hour and will not get back until close to 4pm. She states if she does not answer it is okay to leave a detailed vm of whether the nose piece has been prescribed.

## 2021-02-11 ENCOUNTER — Telehealth (HOSPITAL_COMMUNITY): Payer: Self-pay | Admitting: *Deleted

## 2021-02-11 DIAGNOSIS — N2581 Secondary hyperparathyroidism of renal origin: Secondary | ICD-10-CM | POA: Diagnosis not present

## 2021-02-11 DIAGNOSIS — N186 End stage renal disease: Secondary | ICD-10-CM | POA: Diagnosis not present

## 2021-02-11 DIAGNOSIS — Z992 Dependence on renal dialysis: Secondary | ICD-10-CM | POA: Diagnosis not present

## 2021-02-11 DIAGNOSIS — I129 Hypertensive chronic kidney disease with stage 1 through stage 4 chronic kidney disease, or unspecified chronic kidney disease: Secondary | ICD-10-CM | POA: Diagnosis not present

## 2021-02-11 DIAGNOSIS — D631 Anemia in chronic kidney disease: Secondary | ICD-10-CM | POA: Diagnosis not present

## 2021-02-11 DIAGNOSIS — I270 Primary pulmonary hypertension: Secondary | ICD-10-CM | POA: Diagnosis not present

## 2021-02-11 NOTE — Telephone Encounter (Signed)
Surgical clearance form faxed to [facility] at [phone]

## 2021-02-12 ENCOUNTER — Other Ambulatory Visit: Payer: Self-pay

## 2021-02-12 ENCOUNTER — Encounter (HOSPITAL_COMMUNITY): Payer: Self-pay | Admitting: Vascular Surgery

## 2021-02-12 NOTE — Telephone Encounter (Signed)
Returned a call to the patient. Per husband she is not available right now. I will call patient again later.

## 2021-02-12 NOTE — Telephone Encounter (Signed)
Called patient again to see if I could assist her. She explained to me what she needed. I informed her that Dr Claiborne Billings is on vacation this week, however he indicated in his last office note what kind of mask he wants her to try. If she wants to try it, she can come by the office later today when I return from [facility] and  I will provide her with a sample mask to try. Patient agrees to come by and get the mask.

## 2021-02-12 NOTE — Progress Notes (Signed)
PCP - Pricilla Holm Cardiologist - Loralie Champagne  PPM/ICD - denies   Chest x-ray - n/a EKG - 02/07/21 Stress Test - 04/17/15 ECHO - 08/27/20 Cardiac Cath - 08/09/20  CPAP - yes, wears nightly  No diabetes  Blood Thinner Instructions: Pt instructed to stop taking Eliquis 3 days prior to surgery. Last dose is 02/10/21.   ERAS Protcol - no  COVID TEST- not needed ambulatory surgery  Anesthesia review: yes, cardiac history  Patient verbally denies any shortness of breath, fever, cough and chest pain during phone call   -------------  SDW INSTRUCTIONS given:  Your procedure is scheduled on 02/14/21.  Report to [facility] Main Entrance "A" at 07:30 A.M., and check in at the Admitting office.  Call this number if you have problems the morning of surgery:  [phone]   Remember:  Do not eat or drink after midnight the night before your surgery     Take these medicines the morning of surgery with A SIP OF WATER  acetaminophen (TYLENOL)  famotidine (PEPCID) flecainide (TAMBOCOR) fluticasone (FLONASE) macitentan (OPSUMIT)  midodrine (PROAMATINE) Selexipag (UPTRAVI)   As of today, STOP taking any (unless otherwise instructed by your surgeon) Aleve, Naproxen, Ibuprofen, Motrin, Advil, Goody's, BC's, all herbal medications, fish oil, and all vitamins.  Pt stopped Eliquis 3 days prior to surgery. Last dose was 02/10/21.                      Do not wear jewelry, make up, or nail polish            Do not wear lotions, powders, perfumes, or deodorant.            Do not shave 48 hours prior to surgery.             Do not bring valuables to the hospital.            [facility] is not responsible for any belongings or valuables.  Do NOT Smoke (Tobacco/Vaping) or drink Alcohol 24 hours prior to your procedure If you use a CPAP at night, you may bring all equipment for your overnight stay.   Contacts, glasses, dentures or bridgework may not be worn into surgery.      For  patients admitted to the hospital, discharge time will be determined by your treatment team.   Patients discharged the day of surgery will not be allowed to drive home, and someone needs to stay with them for 24 hours.    Special instructions:   [place]- Preparing For Surgery  Before surgery, you can play an important role. Because skin is not sterile, your skin needs to be as free of germs as possible. You can reduce the number of germs on your skin by washing with CHG (chlorahexidine gluconate) Soap before surgery.  CHG is an antiseptic cleaner which kills germs and bonds with the skin to continue killing germs even after washing.    Oral Hygiene is also important to reduce your risk of infection.  Remember - BRUSH YOUR TEETH THE MORNING OF SURGERY WITH YOUR REGULAR TOOTHPASTE  Please do not use if you have an allergy to CHG or antibacterial soaps. If your skin becomes reddened/irritated stop using the CHG.  Do not shave (including legs and underarms) for at least 48 hours prior to first CHG shower. It is OK to shave your face.  Please follow these instructions carefully.   Shower the NIGHT BEFORE SURGERY and the MORNING OF  SURGERY with DIAL Soap.   Pat yourself dry with a CLEAN TOWEL.  Wear CLEAN PAJAMAS to bed the night before surgery  Place CLEAN SHEETS on your bed the night of your first shower and DO NOT SLEEP WITH PETS.   Day of Surgery: Please shower morning of surgery  Wear Clean/Comfortable clothing the morning of surgery Do not apply any deodorants/lotions.   Remember to brush your teeth WITH YOUR REGULAR TOOTHPASTE.   Questions were answered. Patient verbalized understanding of instructions.

## 2021-02-12 NOTE — Telephone Encounter (Signed)
-----   Message from [place] sent at 02/10/2021 10:30 AM EDT ----- Regarding: CPAP Patient states she was offered a smaller nose piece for her CPAP, but Dr. Claiborne Billings, but at the time did not need it. She states now she has an open sore on her nostril and would like to know if she can now get the  smaller, more comfortable nose piece. She  states she has dialysis in less than an hour and will not get back until close to 4pm. She states if she does not answer it is okay to leave a detailed vm of whether the nose piece has been prescribed.

## 2021-02-13 DIAGNOSIS — I129 Hypertensive chronic kidney disease with stage 1 through stage 4 chronic kidney disease, or unspecified chronic kidney disease: Secondary | ICD-10-CM | POA: Diagnosis not present

## 2021-02-13 DIAGNOSIS — N2581 Secondary hyperparathyroidism of renal origin: Secondary | ICD-10-CM | POA: Diagnosis not present

## 2021-02-13 DIAGNOSIS — Z992 Dependence on renal dialysis: Secondary | ICD-10-CM | POA: Diagnosis not present

## 2021-02-13 DIAGNOSIS — I270 Primary pulmonary hypertension: Secondary | ICD-10-CM | POA: Diagnosis not present

## 2021-02-13 DIAGNOSIS — D631 Anemia in chronic kidney disease: Secondary | ICD-10-CM | POA: Diagnosis not present

## 2021-02-13 DIAGNOSIS — N186 End stage renal disease: Secondary | ICD-10-CM | POA: Diagnosis not present

## 2021-02-13 NOTE — Anesthesia Preprocedure Evaluation (Deleted)
Anesthesia Evaluation    Airway        Dental   Pulmonary           Cardiovascular hypertension,      Neuro/Psych    GI/Hepatic   Endo/Other    Renal/GU      Musculoskeletal   Abdominal   Peds  Hematology   Anesthesia Other Findings   Reproductive/Obstetrics                             Anesthesia Physical Anesthesia Plan  ASA:   Anesthesia Plan:    Post-op Pain Management:    Induction:   PONV Risk Score and Plan:   Airway Management Planned:   Additional Equipment:   Intra-op Plan:   Post-operative Plan:   Informed Consent:   Plan Discussed with:   Anesthesia Plan Comments: (PAT note written 02/13/2021 by Myra Gianotti, PA-C. )        Anesthesia Quick Evaluation

## 2021-02-13 NOTE — Progress Notes (Signed)
Anesthesia Chart Review: Tiffany [name]  Case: [id number] Date/Time: 02/14/21 0954   Procedure: LEFT ARM SECOND STAGE BASCILIC VEIN TRANSPOSITION (Left)   Anesthesia type: Choice   Pre-op diagnosis: ESRD   Location: [facility] OR ROOM 11 / [place] OR   Surgeons: [name], MD       DISCUSSION:  Patient is a 75 year old female scheduled for the above procedure. She is s/p first stage left basilic vein transposition (brachiobasilic AVF) on Q000111Q. Hemodialysis was started in late June 2022. Per 01/29/21 [facility] note, she was dialysis MTTF schedule via a right IJ TDC.    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 Cruzita Lederer), esophageal stricture, afib/PAF (s/p DCCV 02/06/15, s/p afib ablation 01/26/17), chronic diastolic CHF with prominent RV failure, pulmonary arterial hypertension (home O2 at 3L/[place]), dyspnea, ESRD (started hemodialysis 01/2021), anemia, OSA (CPAP), BPPV. GI Dr. 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 History of "prolonged sedation" after anesthesia.  Last HF Clinic follow-up 02/07/21 with Allena Katz, [place] for follow-up RV failure and pulmonary arterial hypertension, as well as preoperative evaluation. 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 She in on Opsumit, Selexipag, and Adempas. Has OSA as well, and now using CPAP. Also on 3L home O2. Volume now controlled with hemodialysis. Midodrine started of hypotension. For surgery, "She is at moderate risk for surgery doe to her CHF and pulmonary hypertension." 4-6 week follow-up planned.   She was last evaluated by endocrinologist Dr. Cruzita Lederer on 12/18/20 for follow-up thyrotoxicosis (mild Graves' disease versus resolving thyroiditis), thought likely mild Graves' disease. Thyroid uptake and scan was  normal. She has been on and off methimazole depending on labs. At recent visit, no thyrotoxic symptoms but 10 lb weight loss attributed to decreased appetite. She discussed other treatments for Graves' disease including RAI treatment, or as a last resort surgery. Decision to recheck labs and restart methimazole if abnormal. 12/18/20 labs showed: Free T3 low normal at 2.3, Free T4 normal at 1.11, TSH normal at 1.32.     Last Eliquis documented as 02/10/21. Anesthesia team to evaluate on the day of surgery.   VS: Ht 5' (1.524 m)   Wt 58.1 kg   BMI 25.00 kg/m  BP Readings from Last 3 Encounters:  02/07/21 (!) 84/50  01/29/21 (!) 73/43  01/15/21 (!) 92/50     PROVIDERS: [name], MD is PCP Loralie Champagne, MD is HF/pulmonary HTN cardiologist Shelva Majestic, MD is cardiologist for OSA Philemon Kingdom, MD is endocrinologist Leigh Aurora, MD is rheumatologist (previously saw Hurley Cisco, MD in 2017)  Pearson Grippe, MD is nephrologist Scarlette Shorts, MD is GI. Repeat MRI of the pancreas in one year, and GI follow-up as needed per 01/07/21 note.    LABS: For ISTAT day of surgery. As of 01/15/21 H/H 9.2/29.0, PLT 128, glucose 105.    OTHER: O2 SATURATION QUALIFICATIONS:   Patient Saturations on Room Air at Rest = 90% Patient Saturations on Room Air while Ambulating = 83% Patient Saturations on 2 Liters of oxygen while Ambulating = 93% Please briefly explain why patient needs home oxygen: Pulmonary hypertension, hypoxia   PFTs > 5 years ago.     IMAGES: MR Abdomen 12/04/20: IMPRESSION: 1. Cystic lesion in the pancreatic head measuring 2.0 by 1.7 by 2.8 cm, with internal septations/lobulation. This  lesion was not present on the 08/08/2015 CT abdomen. Possibilities include low risk intraductal papillary mucinous neoplasm, serous cystadenoma, or postinflammatory cystic lesion. Possibilities for further workup given the lesion size include endoscopic ultrasound with  fine-needle aspiration, or follow up surveillance MRI in 6 months time. This recommendation follows ACR consensus guidelines: Management of Incidental Pancreatic Cysts: A White Paper of the ACR Incidental Findings Committee. [place] [id number]. 2. Pancreas divisum.  No dorsal pancreatic duct prominence. 3. Prominent right hilum likely due to a combination of prominent right pulmonary artery and possible right hilar adenopathy. This could be investigated with chest CT if clinically warranted. 4. Third spacing of fluid with mild upper abdominal ascites along with mesenteric and subcutaneous edema. 5. Mild hepatic congestion likely from right heart failure. Correlate with cardiac history. 6.  Aortic Atherosclerosis (ICD10-I70.0). 7. Equivocal wall thickening in the gastric cardia, significance uncertain. 8. Umbilical hernia contains fluid and adipose tissue. 9. Mild thoracolumbar scoliosis.     EKG: 02/07/21: Sinus rhythm with 1st degree A-V block Incomplete right bundle branch block Septal infarct , age undetermined Abnormal ECG Confirmed by Cherlynn Kaiser [phone]) on 02/08/2021 3:26:57 PM     CV: Echo 08/27/20: IMPRESSIONS   1. Left ventricular ejection fraction, by estimation, is 55 to 60%. The  left ventricle has normal function. The left ventricle has no regional  wall motion abnormalities. Left ventricular diastolic parameters are  consistent with Grade II diastolic  dysfunction (pseudonormalization).   2. Right ventricular systolic function is moderately reduced. The right  ventricular size is moderately enlarged. There is severely elevated  pulmonary artery systolic pressure. The estimated right ventricular  systolic pressure is Q000111Q mmHg. D-shaped  interventricular septum suggestive of RV pressure/volume overload.   3. Left atrial size was mildly dilated.   4. Right atrial size was moderately dilated.   5. The mitral valve is normal in structure. Trivial  mitral valve  regurgitation. No evidence of mitral stenosis.   6. Tricuspid valve regurgitation is moderate.   7. The aortic valve is tricuspid. Aortic valve regurgitation is not  visualized. Mild aortic valve sclerosis is present, with no evidence of  aortic valve stenosis.   8. The inferior vena cava is dilated in size with <50% respiratory  variability, suggesting right atrial pressure of 15 mmHg.     [place] 08/09/20: Hemodynamicsm (mmHg) RA mean 4 RV 86/9 PA 87/26, mean 47 PCWP mean 9  Oxygen saturations: PA 75% AO 97%  Cardiac Output (Fick) 6.78  Cardiac Index (Fick) 4.36 PVR 5.6 WU   Cardiac Output (Thermo) 5.61  Cardiac Index (Thermo) 3.61 PVR 6.8 WU   1. Optimized filling pressures. 2. Severe pulmonary arterial hypertension.   She can take metolazone based on weight.  If weight goes up >3 lbs by next Wednesday, take a dose of 2.5 metolazone with am torsemide.  If not, do not take metolazone.  Will make it once weekly prn.     27 hour Holter Monitor 07/06/17: Study Highlights 1. Primarily NSR. 2. 3% PVCs. 3. 3% PACs, supraventricular runs (short atrial tachycardia runs most likely). 4. No atrial fibrillation noted.      EP Study/Ablation 01/26/17: PROCEDURES: 1. Comprehensive electrophysiologic study. 2. Coronary sinus pacing and recording. 3. Three-dimensional mapping of atrial fibrillation 4. Ablation of atrial fibrillation 5. Intracardiac echocardiography. 6. Transseptal puncture of an intact septum. 7. Arrhythmia induction with pacing with isuprel infusion     Nuclear Stress Test 04/17/15: IMPRESSION: 1. No reversible ischemia or infarction.  2. Normal left ventricular wall motion. 3. Left ventricular ejection fraction 90% 4. Low-risk stress test findings*.     Carotid US 04/15/15: Summary:  Bilateral - 1% to 39% ICA stenosis. Vertebral atery flow is  antegrade.    Past Medical History:  Diagnosis Date   Anemia    Arthritis 04/20/2012    Osteoarthritis-right hip   Atrial fibrillation status post cardioversion, 01/30/15 maintaining SR.  01/31/2015   a. 01/2015 s/p TEE/DCCV;  b. 02/06/2015 recurrent AF noted, amio added;  c. CHA2DS2VASc = 3-->eliquis.   BENIGN POSITIONAL VERTIGO    Cataract    CHF (congestive heart failure) (HCC)    Chronic kidney disease    Complication of anesthesia 04/20/2012   some issues with prolonged sedation after anesthesia   Diverticulosis    DYSLIPIDEMIA    Dyspnea    Dysrhythmia    Esophageal stricture    GERD    HYPERTENSION    a. severe, pt intol of med tx, Pt. has severe "whitecoat" syndrome and refused medical therapy.   Hypothyroidism 09/29/2014   a. pt did not tolerate synthroid and this was subsequently discontinued.   Mild LV dysfunction    a. 01/2015 Echo: EF 45-50%, mild MR, mildly dil LA, mod dil RV with mod to sev reduced fxn, mod-sev TR, PASP 61mHg.   Osteopenia    Pneumonia    Pulmonary hypertension (HCC)    Raynaud disease    CREST   Sleep apnea    Umbilical hernia    URINARY INCONTINENCE 04/20/2012   occ. with nighttime sleep pattern    Past Surgical History:  Procedure Laterality Date   ATRIAL FIBRILLATION ABLATION N/A 01/26/2017   Procedure: Atrial Fibrillation Ablation;  Surgeon: AThompson Grayer MD;  [facility];  Service: Cardiovascular;  Laterality: N/A;   BASCILIC VEIN TRANSPOSITION Left 12/23/2020   Procedure: LEFT FIRST STAGE [place]  Surgeon: CMarty Heck MD;  [facility]  Service: Vascular;  Laterality: Left;   breast ultrasound Right 09/13/2013   There is no sonographic evidence of malignancy. the [id number] cyst in (R) breast is consistent with a benign finding. repeat in 1 year   CARDIAC CATHETERIZATION N/A 08/29/2015   Procedure: Right Heart Cath;  Surgeon: DLarey Dresser MD;  [facility];  Service: Cardiovascular;  Laterality: N/A;   CARDIOVERSION N/A 01/30/2015   Procedure:  CARDIOVERSION;  Surgeon: MSanda Klein MD;  [facility];  Service: Cardiovascular;  Laterality: N/A;   CATARACT EXTRACTION, BILATERAL     CHOLECYSTECTOMY     '90-"sludge"   EYE SURGERY     JOINT REPLACEMENT     NASAL FRACTURE SURGERY  2006   RIGHT HEART CATH N/A 06/30/2017   Procedure: RIGHT HEART CATH;  Surgeon: MLarey Dresser MD;  [facility];  Service: Cardiovascular;  Laterality: N/A;   RIGHT HEART CATH N/A 08/09/2020   Procedure: RIGHT HEART CATH;  Surgeon: MLarey Dresser MD;  [facility];  Service: Cardiovascular;  Laterality: N/A;   TEE WITHOUT CARDIOVERSION N/A 01/30/2015   Procedure: TRANSESOPHAGEAL ECHOCARDIOGRAM (TEE);  Surgeon: MSanda Klein MD;  [facility];  Service: Cardiovascular;  Laterality: N/A;   TEE WITHOUT CARDIOVERSION N/A 01/25/2017   Procedure: TRANSESOPHAGEAL ECHOCARDIOGRAM (TEE);  Surgeon: RFay Records MD;  [facility];  Service: Cardiovascular;  Laterality: N/A;   TOTAL HIP ARTHROPLASTY  04/26/2012   Procedure: TOTAL HIP ARTHROPLASTY ANTERIOR APPROACH;  Surgeon: MRodman Key  Marian Sorrow, MD;  Location: [facility];  Service: Orthopedics;  Laterality: Right;   TUBAL LIGATION  1980    MEDICATIONS: No current facility-administered medications for this encounter.    acetaminophen (TYLENOL) 325 MG tablet   ELIQUIS 5 MG TABS tablet   famotidine (PEPCID) 20 MG tablet   flecainide (TAMBOCOR) 50 MG tablet   fluticasone (FLONASE) 50 MCG/ACT nasal spray   macitentan (OPSUMIT) 10 MG tablet   midodrine (PROAMATINE) 5 MG tablet   Probiotic Product (PROBIOTIC-10) CAPS   Riociguat (ADEMPAS) 2.5 MG TABS   Selexipag (UPTRAVI) 1200 MCG TABS    Myra Gianotti, PA-C Surgical Short Stay/Anesthesiology [facility] Phone [phone] [facility] Phone [phone] 02/13/2021 11:36 AM

## 2021-02-13 NOTE — Telephone Encounter (Signed)
Patient and her husband came by the office yesterday afternoon and was given a N30i mask.

## 2021-02-14 ENCOUNTER — Ambulatory Visit (HOSPITAL_COMMUNITY): Payer: Medicare PPO | Admitting: Vascular Surgery

## 2021-02-14 ENCOUNTER — Ambulatory Visit (HOSPITAL_COMMUNITY)
Admission: RE | Admit: 2021-02-14 | Discharge: 2021-02-14 | Disposition: A | Discharge status: Home / Self Care | Payer: Medicare PPO | Attending: Vascular Surgery | Admitting: Vascular Surgery

## 2021-02-14 ENCOUNTER — Other Ambulatory Visit: Payer: Self-pay

## 2021-02-14 ENCOUNTER — Encounter (HOSPITAL_COMMUNITY)
Admission: RE | Disposition: A | Discharge status: Home / Self Care | Payer: Self-pay | Source: Home / Self Care | Attending: Vascular Surgery

## 2021-02-14 ENCOUNTER — Encounter (HOSPITAL_COMMUNITY): Payer: Self-pay | Admitting: Vascular Surgery

## 2021-02-14 DIAGNOSIS — N186 End stage renal disease: Secondary | ICD-10-CM | POA: Diagnosis not present

## 2021-02-14 DIAGNOSIS — I2721 Secondary pulmonary arterial hypertension: Secondary | ICD-10-CM | POA: Diagnosis not present

## 2021-02-14 DIAGNOSIS — I4891 Unspecified atrial fibrillation: Secondary | ICD-10-CM | POA: Insufficient documentation

## 2021-02-14 DIAGNOSIS — N2581 Secondary hyperparathyroidism of renal origin: Secondary | ICD-10-CM | POA: Diagnosis not present

## 2021-02-14 DIAGNOSIS — E1122 Type 2 diabetes mellitus with diabetic chronic kidney disease: Secondary | ICD-10-CM | POA: Insufficient documentation

## 2021-02-14 DIAGNOSIS — D631 Anemia in chronic kidney disease: Secondary | ICD-10-CM | POA: Diagnosis not present

## 2021-02-14 DIAGNOSIS — I129 Hypertensive chronic kidney disease with stage 1 through stage 4 chronic kidney disease, or unspecified chronic kidney disease: Secondary | ICD-10-CM | POA: Diagnosis not present

## 2021-02-14 DIAGNOSIS — I509 Heart failure, unspecified: Secondary | ICD-10-CM | POA: Diagnosis not present

## 2021-02-14 DIAGNOSIS — Z992 Dependence on renal dialysis: Secondary | ICD-10-CM | POA: Diagnosis not present

## 2021-02-14 DIAGNOSIS — I132 Hypertensive heart and chronic kidney disease with heart failure and with stage 5 chronic kidney disease, or end stage renal disease: Secondary | ICD-10-CM | POA: Diagnosis not present

## 2021-02-14 DIAGNOSIS — I270 Primary pulmonary hypertension: Secondary | ICD-10-CM | POA: Diagnosis not present

## 2021-02-14 DIAGNOSIS — Z880 Allergy status to penicillin: Secondary | ICD-10-CM | POA: Diagnosis not present

## 2021-02-14 DIAGNOSIS — I5032 Chronic diastolic (congestive) heart failure: Secondary | ICD-10-CM | POA: Diagnosis not present

## 2021-02-14 DIAGNOSIS — Z79899 Other long term (current) drug therapy: Secondary | ICD-10-CM | POA: Diagnosis not present

## 2021-02-14 DIAGNOSIS — Z7901 Long term (current) use of anticoagulants: Secondary | ICD-10-CM | POA: Diagnosis not present

## 2021-02-14 DIAGNOSIS — Z9981 Dependence on supplemental oxygen: Secondary | ICD-10-CM | POA: Insufficient documentation

## 2021-02-14 DIAGNOSIS — Z888 Allergy status to other drugs, medicaments and biological substances status: Secondary | ICD-10-CM | POA: Insufficient documentation

## 2021-02-14 HISTORY — PX: BASCILIC VEIN TRANSPOSITION: SHX5742

## 2021-02-14 HISTORY — DX: Pneumonia, unspecified organism: J18.9

## 2021-02-14 HISTORY — DX: Umbilical hernia without obstruction or gangrene: K42.9

## 2021-02-14 LAB — POCT I-STAT, CHEM 8
BUN: 32 mg/dL — ABNORMAL HIGH (ref 8–23)
Calcium, Ion: 0.88 mmol/L — CL (ref 1.15–1.40)
Chloride: 94 mmol/L — ABNORMAL LOW (ref 98–111)
Creatinine, Ser: 4 mg/dL — ABNORMAL HIGH (ref 0.44–1.00)
Glucose, Bld: 89 mg/dL (ref 70–99)
HCT: 31 % — ABNORMAL LOW (ref 36.0–46.0)
Hemoglobin: 10.5 g/dL — ABNORMAL LOW (ref 12.0–15.0)
Potassium: 4.1 mmol/L (ref 3.5–5.1)
Sodium: 132 mmol/L — ABNORMAL LOW (ref 135–145)
TCO2: 31 mmol/L (ref 22–32)

## 2021-02-14 SURGERY — TRANSPOSITION, VEIN, BASILIC
Anesthesia: Monitor Anesthesia Care | Site: Arm Upper | Laterality: Left

## 2021-02-14 MED ORDER — ONDANSETRON HCL 4 MG/2ML IJ SOLN
INTRAMUSCULAR | Status: DC | PRN
  Administered 2021-02-14: 4 mg via INTRAVENOUS

## 2021-02-14 MED ORDER — FENTANYL CITRATE (PF) 100 MCG/2ML IJ SOLN: 50.0000 ug | Freq: Once | INTRAMUSCULAR | Status: AC

## 2021-02-14 MED ORDER — ORAL CARE MOUTH RINSE
15.0000 mL | Freq: Once | OROMUCOSAL | Status: AC
Start: 2021-02-14 — End: 2021-02-14

## 2021-02-14 MED ORDER — CHLORHEXIDINE GLUCONATE 4 % EX LIQD: 60.0000 mL | Freq: Once | CUTANEOUS | Status: DC

## 2021-02-14 MED ORDER — SODIUM CHLORIDE 0.9 % IV SOLN: INTRAVENOUS | Status: DC

## 2021-02-14 MED ORDER — ALBUMIN HUMAN 5 % IV SOLN: INTRAVENOUS | Status: DC | PRN

## 2021-02-14 MED ORDER — EPHEDRINE 5 MG/ML INJ
INTRAVENOUS | Status: AC
  Filled 2021-02-14: qty 10

## 2021-02-14 MED ORDER — PHENYLEPHRINE HCL (PRESSORS) 10 MG/ML IV SOLN
INTRAVENOUS | Status: DC | PRN
  Administered 2021-02-14 (×4): 100 ug via INTRAVENOUS

## 2021-02-14 MED ORDER — PROMETHAZINE HCL 25 MG/ML IJ SOLN: 6.2500 mg | INTRAMUSCULAR | Status: DC | PRN

## 2021-02-14 MED ORDER — LIDOCAINE-EPINEPHRINE (PF) 1.5 %-1:200000 IJ SOLN
INTRAMUSCULAR | Status: DC | PRN
  Administered 2021-02-14: 25 mL via PERINEURAL

## 2021-02-14 MED ORDER — HEPARIN 6000 UNIT IRRIGATION SOLUTION
Status: DC | PRN
  Administered 2021-02-14: 1

## 2021-02-14 MED ORDER — LIDOCAINE HCL (PF) 1 % IJ SOLN
INTRAMUSCULAR | Status: AC
  Filled 2021-02-14: qty 30

## 2021-02-14 MED ORDER — VANCOMYCIN HCL IN DEXTROSE 1-5 GM/200ML-% IV SOLN
1000.0000 mg | INTRAVENOUS | Status: AC
  Administered 2021-02-14: 1000 mg via INTRAVENOUS
  Filled 2021-02-14: qty 200

## 2021-02-14 MED ORDER — CHLORHEXIDINE GLUCONATE 0.12 % MT SOLN
15.0000 mL | Freq: Once | OROMUCOSAL | Status: AC
Start: 2021-02-14 — End: 2021-02-14
  Administered 2021-02-14: 15 mL via OROMUCOSAL
  Filled 2021-02-14: qty 15

## 2021-02-14 MED ORDER — MIDAZOLAM HCL 2 MG/2ML IJ SOLN
INTRAMUSCULAR | Status: AC
  Filled 2021-02-14: qty 2

## 2021-02-14 MED ORDER — LIDOCAINE HCL 1 % IJ SOLN
INTRAMUSCULAR | Status: DC | PRN
  Administered 2021-02-14: 30 mL

## 2021-02-14 MED ORDER — PROTAMINE SULFATE 10 MG/ML IV SOLN
INTRAVENOUS | Status: DC | PRN
  Administered 2021-02-14: 20 mg via INTRAVENOUS

## 2021-02-14 MED ORDER — 0.9 % SODIUM CHLORIDE (POUR BTL) OPTIME
TOPICAL | Status: DC | PRN
  Administered 2021-02-14: 1000 mL

## 2021-02-14 MED ORDER — FENTANYL CITRATE (PF) 100 MCG/2ML IJ SOLN
INTRAMUSCULAR | Status: AC
  Administered 2021-02-14: 50 ug via INTRAVENOUS
  Filled 2021-02-14: qty 2

## 2021-02-14 MED ORDER — EPHEDRINE SULFATE 50 MG/ML IJ SOLN
INTRAMUSCULAR | Status: DC | PRN
  Administered 2021-02-14: 10 mg via INTRAVENOUS

## 2021-02-14 MED ORDER — HEPARIN SODIUM (PORCINE) 1000 UNIT/ML IJ SOLN
INTRAMUSCULAR | Status: DC | PRN
  Administered 2021-02-14: 3000 [IU] via INTRAVENOUS

## 2021-02-14 MED ORDER — HEPARIN 6000 UNIT IRRIGATION SOLUTION
Status: AC
  Filled 2021-02-14: qty 500

## 2021-02-14 MED ORDER — FENTANYL CITRATE (PF) 100 MCG/2ML IJ SOLN: 25.0000 ug | INTRAMUSCULAR | Status: DC | PRN

## 2021-02-14 MED ORDER — PHENYLEPHRINE 40 MCG/ML (10ML) SYRINGE FOR IV PUSH (FOR BLOOD PRESSURE SUPPORT)
PREFILLED_SYRINGE | INTRAVENOUS | Status: AC
  Filled 2021-02-14: qty 10

## 2021-02-14 MED ORDER — OXYCODONE-ACETAMINOPHEN 5-325 MG PO TABS: 1.0000 | ORAL_TABLET | Freq: Three times a day (TID) | ORAL | 0 refills | Status: DC | PRN

## 2021-02-14 MED ORDER — ACETAMINOPHEN 500 MG PO TABS
1000.0000 mg | ORAL_TABLET | Freq: Once | ORAL | Status: AC
  Administered 2021-02-14: 1000 mg via ORAL
  Filled 2021-02-14: qty 2

## 2021-02-14 MED ORDER — PHENYLEPHRINE HCL-NACL 10-0.9 MG/250ML-% IV SOLN
INTRAVENOUS | Status: DC | PRN
  Administered 2021-02-14: 25 ug/min via INTRAVENOUS

## 2021-02-14 MED ORDER — ALBUMIN HUMAN 5 % IV SOLN
12.5000 g | Freq: Once | INTRAVENOUS | Status: AC
  Administered 2021-02-14: 12.5 g via INTRAVENOUS
  Filled 2021-02-14: qty 250

## 2021-02-14 MED ORDER — DEXAMETHASONE SODIUM PHOSPHATE 10 MG/ML IJ SOLN
INTRAMUSCULAR | Status: DC | PRN
  Administered 2021-02-14: 5 mg

## 2021-02-14 MED ORDER — ONDANSETRON HCL 4 MG/2ML IJ SOLN
INTRAMUSCULAR | Status: AC
  Filled 2021-02-14: qty 2

## 2021-02-14 SURGICAL SUPPLY — 44 items
ADH SKN CLS APL DERMABOND .7 (GAUZE/BANDAGES/DRESSINGS) ×1
AGENT HMST SPONGE THK3/8 (HEMOSTASIS)
ARMBAND PINK RESTRICT EXTREMIT (MISCELLANEOUS) ×3 IMPLANT
BAG COUNTER SPONGE SURGICOUNT (BAG) ×2 IMPLANT
BAG SPNG CNTER NS LX DISP (BAG) ×1
BAG SURGICOUNT SPONGE COUNTING (BAG) ×1
BNDG ELASTIC 4X5.8 VLCR STR LF (GAUZE/BANDAGES/DRESSINGS) ×2 IMPLANT
CANISTER SUCT 3000ML PPV (MISCELLANEOUS) ×3 IMPLANT
CLIP VESOCCLUDE MED 24/CT (CLIP) ×3 IMPLANT
CLIP VESOCCLUDE SM WIDE 24/CT (CLIP) ×3 IMPLANT
COVER PROBE W GEL 5X96 (DRAPES) ×3 IMPLANT
DECANTER SPIKE VIAL GLASS SM (MISCELLANEOUS) ×3 IMPLANT
DERMABOND ADVANCED (GAUZE/BANDAGES/DRESSINGS) ×2
DERMABOND ADVANCED .7 DNX12 (GAUZE/BANDAGES/DRESSINGS) ×1 IMPLANT
ELECT REM PT RETURN 9FT ADLT (ELECTROSURGICAL) ×3
ELECTRODE REM PT RTRN 9FT ADLT (ELECTROSURGICAL) ×1 IMPLANT
GLOVE SRG 8 PF TXTR STRL LF DI (GLOVE) ×1 IMPLANT
GLOVE SURG ENC MOIS LTX SZ7.5 (GLOVE) ×3 IMPLANT
GLOVE SURG UNDER POLY LF SZ8 (GLOVE) ×3
GOWN STRL REUS W/ TWL LRG LVL3 (GOWN DISPOSABLE) ×2 IMPLANT
GOWN STRL REUS W/ TWL XL LVL3 (GOWN DISPOSABLE) ×2 IMPLANT
GOWN STRL REUS W/TWL LRG LVL3 (GOWN DISPOSABLE) ×6
GOWN STRL REUS W/TWL XL LVL3 (GOWN DISPOSABLE) ×6
HEMOSTAT SPONGE AVITENE ULTRA (HEMOSTASIS) IMPLANT
KIT BASIN OR (CUSTOM PROCEDURE TRAY) ×3 IMPLANT
KIT TURNOVER KIT B (KITS) ×3 IMPLANT
NDL HYPO 25GX1X1/2 BEV (NEEDLE) ×1 IMPLANT
NEEDLE HYPO 25GX1X1/2 BEV (NEEDLE) ×3 IMPLANT
NS IRRIG 1000ML POUR BTL (IV SOLUTION) ×3 IMPLANT
PACK CV ACCESS (CUSTOM PROCEDURE TRAY) ×3 IMPLANT
PAD ARMBOARD 7.5X6 YLW CONV (MISCELLANEOUS) ×6 IMPLANT
SUT MNCRL AB 4-0 PS2 18 (SUTURE) ×7 IMPLANT
SUT PROLENE 6 0 BV (SUTURE) ×29 IMPLANT
SUT PROLENE 7 0 BV 1 (SUTURE) IMPLANT
SUT SILK 2 0 SH (SUTURE) IMPLANT
SUT SILK 3 0 (SUTURE) ×3
SUT SILK 3-0 18XBRD TIE 12 (SUTURE) IMPLANT
SUT VIC AB 2-0 CT1 27 (SUTURE)
SUT VIC AB 2-0 CT1 TAPERPNT 27 (SUTURE) ×1 IMPLANT
SUT VIC AB 3-0 SH 27 (SUTURE) ×9
SUT VIC AB 3-0 SH 27X BRD (SUTURE) ×2 IMPLANT
TOWEL GREEN STERILE (TOWEL DISPOSABLE) ×3 IMPLANT
UNDERPAD 30X36 HEAVY ABSORB (UNDERPADS AND DIAPERS) ×3 IMPLANT
WATER STERILE IRR 1000ML POUR (IV SOLUTION) ×3 IMPLANT

## 2021-02-14 NOTE — Anesthesia Preprocedure Evaluation (Addendum)
Anesthesia Evaluation  Patient identified by MRN, date of birth, ID band Patient awake    Reviewed: Allergy & Precautions, NPO status , Patient's Chart, lab work & pertinent test results  History of Anesthesia Complications (+) PROLONGED EMERGENCE and history of anesthetic complications  Airway Mallampati: II  TM Distance: >3 FB Neck ROM: Full    Dental no notable dental hx. (+) Dental Advisory Given   Pulmonary sleep apnea and Continuous Positive Airway Pressure Ventilation ,  Severe pulmonary htn   Pulmonary exam normal        Cardiovascular hypertension, Pt. on home beta blockers +CHF  + dysrhythmias Atrial Fibrillation  Rhythm:Regular Rate:Normal + Systolic murmurs Echo 09/11/45: IMPRESSIONS  1. Left ventricular ejection fraction, by estimation, is 55 to 60%. The  left ventricle has normal function. The left ventricle has no regional  wall motion abnormalities. Left ventricular diastolic parameters are  consistent with Grade II diastolic  dysfunction (pseudonormalization).  2. Right ventricular systolic function is moderately reduced. The right  ventricular size is moderately enlarged. There is severely elevated  pulmonary artery systolic pressure. The estimated right ventricular  systolic pressure is 096.2 mmHg. D-shaped  interventricular septum suggestive of RV pressure/volume overload.  3. Left atrial size was mildly dilated.  4. Right atrial size was moderately dilated.  5. The mitral valve is normal in structure. Trivial mitral valve  regurgitation. No evidence of mitral stenosis.  6. Tricuspid valve regurgitation is moderate.  7. The aortic valve is tricuspid. Aortic valve regurgitation is not  visualized. Mild aortic valve sclerosis is present, with no evidence of  aortic valve stenosis.  8. The inferior vena cava is dilated in size with <50% respiratory  variability, suggesting right atrial pressure of  15 mmHg.    [place] 08/09/20: Hemodynamicsm (mmHg) RA mean 4 RV 86/9 PA 87/26, mean 47 PCWP mean 9  Oxygen saturations: PA 75% AO 97%  Cardiac Output (Fick) 6.78  Cardiac Index (Fick) 4.36 PVR 5.6 WU   Cardiac Output (Thermo) 5.61  Cardiac Index (Thermo) 3.61 PVR 6.8 WU  1. Optimized filling pressures.  2. Severe pulmonary arterial hypertension.     Neuro/Psych PSYCHIATRIC DISORDERS Anxiety negative neurological ROS     GI/Hepatic Neg liver ROS, GERD  Medicated,  Endo/Other  Hypothyroidism   Renal/GU ESRF and DialysisRenal disease     Musculoskeletal  (+) Arthritis ,   Abdominal   Peds  Hematology   Anesthesia Other Findings   Reproductive/Obstetrics                            Anesthesia Physical  Anesthesia Plan  ASA: 4  Anesthesia Plan: MAC   Post-op Pain Management:  Regional for Post-op pain   Induction: Intravenous  PONV Risk Score and Plan: 2 and Propofol infusion and Ondansetron  Airway Management Planned: Natural Airway and Simple Face Mask  Additional Equipment: None  Intra-op Plan:   Post-operative Plan:   Informed Consent: I have reviewed the patients History and Physical, chart, labs and discussed the procedure including the risks, benefits and alternatives for the proposed anesthesia with the patient or authorized representative who has indicated his/her understanding and acceptance.     Dental advisory given  Plan Discussed with: CRNA and Anesthesiologist  Anesthesia Plan Comments: (PAT note written 02/13/2021 by Myra Gianotti, PA-C. )       Anesthesia Quick Evaluation

## 2021-02-14 NOTE — Op Note (Signed)
    OPERATIVE NOTE   PROCEDURE: left second stage basilic vein transposition (brachiobasilic arteriovenous fistula) placement  PRE-OPERATIVE DIAGNOSIS: ESRD  POST-OPERATIVE DIAGNOSIS: same  SURGEON: [name], MD  ASSISTANT(S): Leontine Locket, PA  ANESTHESIA: regional  ESTIMATED BLOOD LOSS: <75 mL  FINDING(S): The left upper arm basilic vein was fully mobilized through three skip incisions.  The vein was fairly thin and frail.  We did have to make multiple repairs in one area of the vein where there was a torn side branch that had been ligated.  Ultimately it was transected at the antecubitum and transposed through a new tunnel and a new end-to-end anastomosis was performed.  Palpable thrill at completion.  SPECIMEN(S):  None  INDICATIONS:   Tiffany [name] is a 75 y.o. female who presents with ESRD.  The patient is scheduled for left second stage basilic vein transposition.  The patient is aware the risks include but are not limited to: bleeding, infection, steal syndrome, nerve damage, ischemic monomelic neuropathy, failure to mature, and need for additional procedures.  The patient is aware of the risks of the procedure and elects to proceed forward.   DESCRIPTION: After full informed written consent was obtained from the patient, the patient was brought back to the operating room and placed supine upon the operating table.  Prior to induction, the patient received IV antibiotics.   After obtaining adequate anesthesia, the patient was then prepped and draped in the standard fashion for a left arm access procedure.  I turned my attention first to identifying the patient's brachiobasilic arteriovenous fistula.  Using SonoSite guidance, the location of this fistula was marked out on the skin.    This was an excellent caliber vein.  I made three longitudinal incisions on the medial aspect of the left upper arm.  Through these incisions I dissected out circumferentially the  basilic vein, taking care to protect the nerve.  Once the vein was fully mobilized, all side branches were ligated between silk ties.  The vein was marked for orientation.  I then used a curved tunneler to create a subcutaneous tunnel.  The patient was given 3,000 units IV heparin.  The vein was then transected near the antecubital crease. We did flush the vein and there did appear to be a leak from one of the ligated side branches where the vein had torn.  Ultimately we had to use multiple 6-0 Prolene's here to repair the segment of vein and the vein was very thin and frail.   It was then brought to the previously created tunnel making sure to maintain proper orientation.  A primary anastomosis was then performed between the two cut ends of the vein with a running 6-0 Prolene.  Once this was done the clamps were released.  There was excellent flow through the fistula.  Hemostasis was then achieved.  The wound was irrigated.  The incision was closed with a deep layer of 3-0 Vicryl followed by a subcutaneous 4-0 Monocryl and Dermabond.  There were no immediate complications.  A gentle ace bandage was applied.  COMPLICATIONS: None  CONDITION: Stable  [name], MD [facility] Office: [place]   02/14/2021, 12:24 PM

## 2021-02-14 NOTE — Transfer of Care (Signed)
Immediate Anesthesia Transfer of Care Note  Patient: Tiffany [name]  Procedure(s) Performed: LEFT ARM SECOND STAGE BASILIC VEIN TRANSPOSITION (Left: Arm Upper)  Patient Location: PACU  Anesthesia Type:MAC  Level of Consciousness: awake, alert , oriented and patient cooperative  Airway & Oxygen Therapy: Patient Spontanous Breathing and Patient connected to face mask oxygen  Post-op Assessment: Report given to RN, Post -op Vital signs reviewed and stable and Patient moving all extremities X 4  Post vital signs: Reviewed and stable  Last Vitals:  Vitals Value Taken Time  BP 81/44 02/14/21 1222  Temp    Pulse 82 02/14/21 1226  Resp 23 02/14/21 1226  SpO2 99 % 02/14/21 1226  Vitals shown include unvalidated device data.  Last Pain:  Vitals:   02/14/21 0803  TempSrc:   PainSc: 0-No pain         Complications: No notable events documented.

## 2021-02-14 NOTE — Anesthesia Procedure Notes (Signed)
Anesthesia Regional Block: Supraclavicular block   Pre-Anesthetic Checklist: , timeout performed,  Correct Patient, Correct Site, Correct Laterality,  Correct Procedure, Correct Position, site marked,  Risks and benefits discussed,  Surgical consent,  Pre-op evaluation,  At surgeon's request and post-op pain management  Laterality: Left  Prep: chloraprep       Needles:  Injection technique: Single-shot  Needle Type: Echogenic Stimulator Needle     Needle Length: 5cm  Needle Gauge: 22     Additional Needles:   Narrative:  Start time: 02/14/2021 9:12 AM End time: 02/14/2021 9:22 AM Injection made incrementally with aspirations every 5 mL.  Performed by: Personally  Anesthesiologist: Duane Boston, MD  Additional Notes: Functioning IV was confirmed and monitors applied.  A 63m 22ga echogenic arrow stimulator was used. Sterile prep and drape,hand hygiene and sterile gloves were used.Ultrasound guidance: relevant anatomy identified, needle position confirmed, local anesthetic spread visualized around nerve(s)., vascular puncture avoided.  Image printed for medical record.  Negative aspiration and negative test dose prior to incremental administration of local anesthetic. The patient tolerated the procedure well.

## 2021-02-14 NOTE — Discharge Instructions (Addendum)
[facility]  Discharge Instructions  AV Fistula or Graft Surgery for Dialysis Access  Please refer to the following instructions for your post-procedure care. Your surgeon or physician assistant will discuss any changes with you.  Activity  You may drive the day following your surgery, if you are comfortable and no longer taking prescription pain medication. Resume full activity as the soreness in your incision resolves.  Bathing/Showering  You may shower after you go home. Keep your incision dry for 48 hours. Do not soak in a bathtub, hot tub, or swim until the incision heals completely. You may not shower if you have a hemodialysis catheter.  Incision Care  Clean your incision with mild soap and water after 48 hours. Pat the area dry with a clean towel. You do not need a bandage unless otherwise instructed. Do not apply any ointments or creams to your incision. You may have skin glue on your incision. Do not peel it off. It will come off on its own in about one week. Your arm may swell a bit after surgery. To reduce swelling use pillows to elevate your arm so it is above your heart. Your doctor will tell you if you need to lightly wrap your arm with an ACE bandage.    LEAVE ACE WRAP ON FOR 48 HOURS.  TAKE OFF ON 02/16/2021  Diet  Resume your normal diet. There are not special food restrictions following this procedure. In order to heal from your surgery, it is CRITICAL to get adequate nutrition. Your body requires vitamins, minerals, and protein. Vegetables are the best source of vitamins and minerals. Vegetables also provide the perfect balance of protein. Processed food has little nutritional value, so try to avoid this.  Medications  Resume taking all of your medications. If your incision is causing pain, you may take over-the counter pain relievers such as acetaminophen (Tylenol). If you were prescribed a stronger pain medication, please be aware  these medications can cause nausea and constipation. Prevent nausea by taking the medication with a snack or meal. Avoid constipation by drinking plenty of fluids and eating foods with high amount of fiber, such as fruits, vegetables, and grains.  Do not take Tylenol if you are taking prescription pain medications.  RESTART ELIQUIS ON 02/16/2021.  Follow up Your surgeon may want to see you in the office following your access surgery. If so, this will be arranged at the time of your surgery.  Please call us immediately for any of the following conditions:  Increased pain, redness, drainage (pus) from your incision site Fever of 101 degrees or higher Severe or worsening pain at your incision site Hand pain or numbness.  Reduce your risk of vascular disease:  Stop smoking. If you would like help, call [facility] at 1-800-QUIT-NOW (480)586-3856) or [place] at [place] your cholesterol Maintain a desired weight Control your diabetes Keep your blood pressure down  Dialysis  It will take several weeks to several months for your new dialysis access to be ready for use. Your surgeon will determine when it is okay to use it. Your nephrologist will continue to direct your dialysis. You can continue to use your Permcath until your new access is ready for use.   02/14/2021 Tiffany [name] [id number] Aug 08, 1945  Surgeon(s): [name], MD  Procedure(s): LEFT ARM SECOND STAGE BASILIC VEIN TRANSPOSITION  x Do not stick fistula for 8 weeks    If you have any questions, please  call the office at [phone].

## 2021-02-14 NOTE — H&P (Signed)
History and Physical Interval Note:  02/14/2021 9:26 AM  Tiffany [name]  has presented today for surgery, with the diagnosis of ESRD.  The various methods of treatment have been discussed with the patient and family. After consideration of risks, benefits and other options for treatment, the patient has consented to  Procedure(s): LEFT ARM SECOND STAGE [place] (Left) as a surgical intervention.  The patient's history has been reviewed, patient examined, no change in status, stable for surgery.  I have reviewed the patient's chart and labs.  Questions were answered to the patient's satisfaction.    Left 2nd stage BVT.  [name]  Office Note       CC:  follow up Requesting Provider:  [name], *   HPI: Tiffany [name] is a 75 y.o. (10-Sep-1945) female who presents status post left first stage basilic vein fistula by Dr. Carlis Abbott on 12/23/2020.  She states the incision has healed well.  She denies any signs or symptoms of steal syndrome in her left hand.  Since surgery she has been started on home O2 as well as hemodialysis.  She is dialyzing via right IJ TDC.  She is seen by Dr. [name] for management of pulmonary artery hypertension.  She states her breathing has improved since initiation of hemodialysis however she is still requiring 3 L O2 around-the-clock.  She is scheduled to see Dr. Algernon Huxley again in about 10 days.  She is currently dialyzing on a Monday, Tuesday, Thursday, Friday schedule.  She is also on Eliquis for atrial fibrillation.       Past Medical History:  Diagnosis Date   Anemia     Arthritis 04-20-12    Osteoarthritis-right hip   Atrial fibrillation status post cardioversion, 01/30/15 maintaining SR. 01/31/2015    a. 01/2015 s/p TEE/DCCV;  b. 02/06/2015 recurrent AF noted, amio added;  c. CHA2DS2VASc = 3-->eliquis.   BENIGN POSITIONAL VERTIGO     Cataract     CHF (congestive heart failure) (HCC)     Chronic kidney disease     Complication  of anesthesia 04-20-12    some issues with prolonged sedation after anesthesia   Diverticulosis     DYSLIPIDEMIA     Dyspnea     Dysrhythmia     Esophageal stricture     GERD     HYPERTENSION      a. severe, pt intol of med tx, Pt. has severe "whitecoat" syndrome and refused medical therapy.   Hypothyroidism 09/29/2014    a. pt did not tolerate synthroid and this was subsequently discontinued.   Mild LV dysfunction      a. 01/2015 Echo: EF 45-50%, mild MR, mildly dil LA, mod dil RV with mod to sev reduced fxn, mod-sev TR, PASP 68mHg.   Osteopenia     Pulmonary hypertension (HCC)     Raynaud disease      CREST   Sleep apnea     URINARY INCONTINENCE 04-20-12    occ. with nighttime sleep pattern           Past Surgical History:  Procedure Laterality Date   ATRIAL FIBRILLATION ABLATION N/A 01/26/2017    Procedure: Atrial Fibrillation Ablation;  Surgeon: AThompson Grayer MD;  [facility];  Service: Cardiovascular;  Laterality: N/A;   BASCILIC VEIN TRANSPOSITION Left 12/23/2020    Procedure: LEFT FIRST STAGE [place]  Surgeon: CMarty Heck MD;  [place] [facility]  Service: Vascular;  Laterality: Left;  breast ultrasound Right 09/13/13    There is no sonographic evidence of malignancy. the [id number] complicated cyst in (R) breast is consistent with a benign finding. repeat in 1 year   CARDIAC CATHETERIZATION N/A 08/29/2015    Procedure: Right Heart Cath;  Surgeon: Larey Dresser, MD;  Location: [place] [facility];  Service: Cardiovascular;  Laterality: N/A;   CARDIOVERSION N/A 01/30/2015    Procedure: CARDIOVERSION;  Surgeon: Sanda Klein, MD;  Location: [place] [facility];  Service: Cardiovascular;  Laterality: N/A;   CATARACT EXTRACTION, BILATERAL       CHOLECYSTECTOMY        '90-"sludge"   NASAL FRACTURE SURGERY   2006   RIGHT HEART CATH N/A 06/30/2017    Procedure: RIGHT HEART CATH;  Surgeon: Larey Dresser, MD;  Location: [place] [facility];   Service: Cardiovascular;  Laterality: N/A;   RIGHT HEART CATH N/A 08/09/2020    Procedure: RIGHT HEART CATH;  Surgeon: Larey Dresser, MD;  Location: [place] [facility];  Service: Cardiovascular;  Laterality: N/A;   TEE WITHOUT CARDIOVERSION N/A 01/30/2015    Procedure: TRANSESOPHAGEAL ECHOCARDIOGRAM (TEE);  Surgeon: Sanda Klein, MD;  Location: [facility] ENDOSCOPY;  Service: Cardiovascular;  Laterality: N/A;   TEE WITHOUT CARDIOVERSION N/A 01/25/2017    Procedure: TRANSESOPHAGEAL ECHOCARDIOGRAM (TEE);  Surgeon: Fay Records, MD;  Location: [facility]OSCOPY;  Service: Cardiovascular;  Laterality: N/A;   TOTAL HIP ARTHROPLASTY   04/26/2012    Procedure: TOTAL HIP ARTHROPLASTY ANTERIOR APPROACH;  Surgeon: Mauri Pole, MD;  Location: [facility];  Service: Orthopedics;  Laterality: Right;   TUBAL LIGATION   1980      Social History         Socioeconomic History   Marital status: Married      Spouse name: [name]   Number of children: 2   Years of education: Not on file   Highest education level: Not on file  Occupational History   Occupation: retired  Tobacco Use   Smoking status: Never   Smokeless tobacco: Never  Vaping Use   Vaping Use: Never used  Substance and Sexual Activity   Alcohol use: Yes      Comment: less than 1 drink per week   Drug use: No   Sexual activity: Yes  Other Topics Concern   Not on file  Social History Narrative    She enjoys birding.  Lives at home with husband.    Married.    retired Quarry manager, Tax inspector    Social Determinants of Adult nurse Strain: Not on [employer] Insecurity: Not on file  Transportation Needs: Not on file  Physical Activity: Not on file  Stress: Not on file  Social Connections: Not on file  Intimate Partner Violence: Not on file           Family History  Adopted: Yes  Problem Relation Age of Onset   Other Other          patient was adopted            Current Outpatient Medications   Medication Sig Dispense Refill   acetaminophen (TYLENOL) 325 MG tablet Take 2 tablets (650 mg total) by mouth every 4 (four) hours as needed for headache or mild pain.       ELIQUIS 5 MG TABS tablet TAKE 1 TABLET BY MOUTH TWICE A DAY 180 tablet 1   famotidine (PEPCID) 20 MG tablet TAKE 1 TABLET BY  MOUTH TWICE A DAY 180 tablet 3   flecainide (TAMBOCOR) 50 MG tablet TAKE 1 TABLET BY MOUTH TWICE A DAY 180 tablet 2   fluticasone (FLONASE) 50 MCG/ACT nasal spray Place 1 spray daily as needed into both nostrils for allergies or rhinitis.       iron sucrose in sodium chloride 0.9 % 100 mL Iron Sucrose (Venofer)       macitentan (OPSUMIT) 10 MG tablet Take 1 tablet (10 mg total) by mouth daily. 30 tablet 11   Probiotic Product (PROBIOTIC-10) CAPS Take 1 capsule by mouth 3 (three) times a week.       Probiotic, Lactobacillus, CAPS Take by mouth.       Riociguat (ADEMPAS) 2.5 MG TABS Take 2.5 mg by mouth 3 (three) times daily. 90 tablet 11   Selexipag (UPTRAVI) 1200 MCG TABS Take 2 tablets by mouth in the morning and at bedtime. 120 tablet 11    No current facility-administered medications for this visit.           Allergies  Allergen Reactions   Levothyroxine Shortness Of Breath and Other (See Comments)      Exhaustion also - Per pt her this medication could have not been the reason for her symptoms   Statins Other (See Comments)      "Pain, weakness and kidney problems"   Prednisone Other (See Comments)      Severe insomnia   Penicillins Other (See Comments)      dry mouth Has patient had a PCN reaction causing immediate rash, facial/tongue/throat swelling, SOB or lightheadedness with hypotension: No Has patient had a PCN reaction causing severe rash involving mucus membranes or skin necrosis: No Has patient had a PCN reaction that required hospitalization No Has patient had a PCN reaction occurring within the last 10 years: No If all of the above answers are "NO", then may proceed with  Cephalosporin use.          REVIEW OF SYSTEMS:    '[X]'$  denotes positive finding, '[ ]'$  denotes negative finding Cardiac   Comments:  Chest pain or chest pressure:      Shortness of breath upon exertion:      Short of breath when lying flat:      Irregular heart rhythm:             Vascular      Pain in calf, thigh, or hip brought on by ambulation:      Pain in feet at night that wakes you up from your sleep:      Blood clot in your veins:      Leg swelling:             Pulmonary      Oxygen at home:      Productive cough:      Wheezing:             Neurologic      Sudden weakness in arms or legs:      Sudden numbness in arms or legs:      Sudden onset of difficulty speaking or slurred speech:      Temporary loss of vision in one eye:      Problems with dizziness:             Gastrointestinal      Blood in stool:      Vomited blood:             Genitourinary  Burning when urinating:      Blood in urine:             Psychiatric      Major depression:             Hematologic      Bleeding problems:      Problems with blood clotting too easily:             Skin      Rashes or ulcers:             Constitutional      Fever or chills:          PHYSICAL EXAMINATION:      Vitals:    01/29/21 1444  BP: (!) 73/43  Pulse: 76  Resp: 20  Temp: 98.1 F (36.7 C)  TempSrc: Temporal  SpO2: 91%  Weight: 128 lb (58.1 kg)  Height: 5' (1.524 m)      General:  WDWN in NAD; vital signs documented above Gait: Not observed HENT: WNL, normocephalic Pulmonary: normal non-labored breathing on O2 by [place] Cardiac: regular HR Abdomen: soft, NT, no masses Skin: without rashes Vascular Exam/Pulses:   Right Left  Radial 2+ (normal) 2+ (normal)    Extremities: without ischemic changes, without Gangrene , without cellulitis; without open wounds; pitting edema of BLE to the level of mid shin Musculoskeletal: no muscle wasting or atrophy       Neurologic: A&O X 3;  No focal  weakness or paresthesias are detected Psychiatric:  The pt has Normal affect.     Non-Invasive Vascular Imaging:   Patent left arm fistula about 5 mm in diameter       ASSESSMENT/PLAN:: 75 y.o. female here for follow up status post left first stage basilic vein fistula creation by Dr. Carlis Abbott   -Patent basilic vein fistula with palpable thrill and no signs or symptoms of steal syndrome -Based on duplex as well as physical exam, fistula is mature enough to proceed with second stage transposition -She will be seen as scheduled by Dr. [name] in the next 1 to 2 weeks.  We will also reach out to his office to see if we can proceed with left arm second stage basilic vein transposition with acceptable risk. -Surgery was discussed with the patient and all questions were answered.  She is agreeable to proceed with the above named procedure.  We will also give her a surgery date and time for about 3 to 4 weeks from now.     Dagoberto Ligas, PA-C [facility] [phone]   Clinic MD:   Scot Dock

## 2021-02-15 NOTE — Anesthesia Postprocedure Evaluation (Signed)
Anesthesia Post Note  Patient: Tiffany [name]  Procedure(s) Performed: LEFT ARM SECOND STAGE BASILIC VEIN TRANSPOSITION (Left: Arm Upper)     Patient location during evaluation: PACU Anesthesia Type: MAC Level of consciousness: awake and alert Pain management: pain level controlled Vital Signs Assessment: post-procedure vital signs reviewed and stable Respiratory status: spontaneous breathing and respiratory function stable Cardiovascular status: stable Postop Assessment: no apparent nausea or vomiting Anesthetic complications: no   No notable events documented.  Last Vitals:  Vitals:   02/14/21 1305 02/14/21 1320  BP: (!) 93/52 (!) 101/48  Pulse: 76 74  Resp: (!) 22 (!) 23  Temp:  36.9 C  SpO2: 94% 93%    Last Pain:  Vitals:   02/14/21 1305  TempSrc:   PainSc: 0-No pain                 Sammy Douthitt DANIEL

## 2021-02-16 ENCOUNTER — Encounter (HOSPITAL_COMMUNITY): Payer: Self-pay | Admitting: Vascular Surgery

## 2021-02-17 DIAGNOSIS — Z992 Dependence on renal dialysis: Secondary | ICD-10-CM | POA: Diagnosis not present

## 2021-02-17 DIAGNOSIS — I129 Hypertensive chronic kidney disease with stage 1 through stage 4 chronic kidney disease, or unspecified chronic kidney disease: Secondary | ICD-10-CM | POA: Diagnosis not present

## 2021-02-17 DIAGNOSIS — N2581 Secondary hyperparathyroidism of renal origin: Secondary | ICD-10-CM | POA: Diagnosis not present

## 2021-02-17 DIAGNOSIS — N189 Chronic kidney disease, unspecified: Secondary | ICD-10-CM | POA: Diagnosis not present

## 2021-02-17 DIAGNOSIS — D631 Anemia in chronic kidney disease: Secondary | ICD-10-CM | POA: Diagnosis not present

## 2021-02-17 DIAGNOSIS — N186 End stage renal disease: Secondary | ICD-10-CM | POA: Diagnosis not present

## 2021-02-17 DIAGNOSIS — I270 Primary pulmonary hypertension: Secondary | ICD-10-CM | POA: Diagnosis not present

## 2021-02-18 DIAGNOSIS — Z992 Dependence on renal dialysis: Secondary | ICD-10-CM | POA: Diagnosis not present

## 2021-02-18 DIAGNOSIS — I270 Primary pulmonary hypertension: Secondary | ICD-10-CM | POA: Diagnosis not present

## 2021-02-18 DIAGNOSIS — D631 Anemia in chronic kidney disease: Secondary | ICD-10-CM | POA: Diagnosis not present

## 2021-02-18 DIAGNOSIS — N2581 Secondary hyperparathyroidism of renal origin: Secondary | ICD-10-CM | POA: Diagnosis not present

## 2021-02-18 DIAGNOSIS — I129 Hypertensive chronic kidney disease with stage 1 through stage 4 chronic kidney disease, or unspecified chronic kidney disease: Secondary | ICD-10-CM | POA: Diagnosis not present

## 2021-02-18 DIAGNOSIS — N189 Chronic kidney disease, unspecified: Secondary | ICD-10-CM | POA: Diagnosis not present

## 2021-02-18 DIAGNOSIS — N186 End stage renal disease: Secondary | ICD-10-CM | POA: Diagnosis not present

## 2021-02-19 ENCOUNTER — Encounter (HOSPITAL_COMMUNITY): Payer: Self-pay | Admitting: Vascular Surgery

## 2021-02-20 DIAGNOSIS — I129 Hypertensive chronic kidney disease with stage 1 through stage 4 chronic kidney disease, or unspecified chronic kidney disease: Secondary | ICD-10-CM | POA: Diagnosis not present

## 2021-02-20 DIAGNOSIS — I270 Primary pulmonary hypertension: Secondary | ICD-10-CM | POA: Diagnosis not present

## 2021-02-20 DIAGNOSIS — N186 End stage renal disease: Secondary | ICD-10-CM | POA: Diagnosis not present

## 2021-02-20 DIAGNOSIS — N2581 Secondary hyperparathyroidism of renal origin: Secondary | ICD-10-CM | POA: Diagnosis not present

## 2021-02-20 DIAGNOSIS — Z992 Dependence on renal dialysis: Secondary | ICD-10-CM | POA: Diagnosis not present

## 2021-02-20 DIAGNOSIS — N189 Chronic kidney disease, unspecified: Secondary | ICD-10-CM | POA: Diagnosis not present

## 2021-02-20 DIAGNOSIS — D631 Anemia in chronic kidney disease: Secondary | ICD-10-CM | POA: Diagnosis not present

## 2021-02-21 ENCOUNTER — Other Ambulatory Visit: Payer: Self-pay | Admitting: *Deleted

## 2021-02-21 DIAGNOSIS — N189 Chronic kidney disease, unspecified: Secondary | ICD-10-CM | POA: Diagnosis not present

## 2021-02-21 DIAGNOSIS — I270 Primary pulmonary hypertension: Secondary | ICD-10-CM | POA: Diagnosis not present

## 2021-02-21 DIAGNOSIS — N186 End stage renal disease: Secondary | ICD-10-CM | POA: Diagnosis not present

## 2021-02-21 DIAGNOSIS — Z992 Dependence on renal dialysis: Secondary | ICD-10-CM | POA: Diagnosis not present

## 2021-02-21 DIAGNOSIS — I129 Hypertensive chronic kidney disease with stage 1 through stage 4 chronic kidney disease, or unspecified chronic kidney disease: Secondary | ICD-10-CM | POA: Diagnosis not present

## 2021-02-21 DIAGNOSIS — D631 Anemia in chronic kidney disease: Secondary | ICD-10-CM | POA: Diagnosis not present

## 2021-02-21 DIAGNOSIS — N2581 Secondary hyperparathyroidism of renal origin: Secondary | ICD-10-CM | POA: Diagnosis not present

## 2021-02-24 DIAGNOSIS — N2581 Secondary hyperparathyroidism of renal origin: Secondary | ICD-10-CM | POA: Diagnosis not present

## 2021-02-24 DIAGNOSIS — N189 Chronic kidney disease, unspecified: Secondary | ICD-10-CM | POA: Diagnosis not present

## 2021-02-24 DIAGNOSIS — N186 End stage renal disease: Secondary | ICD-10-CM | POA: Diagnosis not present

## 2021-02-24 DIAGNOSIS — Z992 Dependence on renal dialysis: Secondary | ICD-10-CM | POA: Diagnosis not present

## 2021-02-25 ENCOUNTER — Telehealth: Payer: Self-pay

## 2021-02-25 DIAGNOSIS — N189 Chronic kidney disease, unspecified: Secondary | ICD-10-CM | POA: Diagnosis not present

## 2021-02-25 DIAGNOSIS — N2581 Secondary hyperparathyroidism of renal origin: Secondary | ICD-10-CM | POA: Diagnosis not present

## 2021-02-25 DIAGNOSIS — Z992 Dependence on renal dialysis: Secondary | ICD-10-CM | POA: Diagnosis not present

## 2021-02-25 DIAGNOSIS — N186 End stage renal disease: Secondary | ICD-10-CM | POA: Diagnosis not present

## 2021-02-25 NOTE — Telephone Encounter (Signed)
Patient having arm pain when trying to sleep and incision site is swollen.  She says she does not know if she has an appt or not.  I called patient back and she says she does not elevate her arm.    She denies fever, hand pain or numbness.  There is no redness or drainage.  I informed her that she has an appt on 03/04/21 with an ultrasound.  She will try to wait until her appointment and will contact the office if she feels any worse.

## 2021-02-27 DIAGNOSIS — N2581 Secondary hyperparathyroidism of renal origin: Secondary | ICD-10-CM | POA: Diagnosis not present

## 2021-02-27 DIAGNOSIS — N189 Chronic kidney disease, unspecified: Secondary | ICD-10-CM | POA: Diagnosis not present

## 2021-02-27 DIAGNOSIS — Z992 Dependence on renal dialysis: Secondary | ICD-10-CM | POA: Diagnosis not present

## 2021-02-27 DIAGNOSIS — N186 End stage renal disease: Secondary | ICD-10-CM | POA: Diagnosis not present

## 2021-02-28 DIAGNOSIS — N186 End stage renal disease: Secondary | ICD-10-CM | POA: Diagnosis not present

## 2021-02-28 DIAGNOSIS — N189 Chronic kidney disease, unspecified: Secondary | ICD-10-CM | POA: Diagnosis not present

## 2021-02-28 DIAGNOSIS — Z992 Dependence on renal dialysis: Secondary | ICD-10-CM | POA: Diagnosis not present

## 2021-02-28 DIAGNOSIS — N2581 Secondary hyperparathyroidism of renal origin: Secondary | ICD-10-CM | POA: Diagnosis not present

## 2021-03-02 DIAGNOSIS — I129 Hypertensive chronic kidney disease with stage 1 through stage 4 chronic kidney disease, or unspecified chronic kidney disease: Secondary | ICD-10-CM | POA: Diagnosis not present

## 2021-03-02 DIAGNOSIS — Z992 Dependence on renal dialysis: Secondary | ICD-10-CM | POA: Diagnosis not present

## 2021-03-02 DIAGNOSIS — N186 End stage renal disease: Secondary | ICD-10-CM | POA: Diagnosis not present

## 2021-03-03 ENCOUNTER — Other Ambulatory Visit (HOSPITAL_COMMUNITY): Payer: Self-pay

## 2021-03-03 DIAGNOSIS — N189 Chronic kidney disease, unspecified: Secondary | ICD-10-CM | POA: Diagnosis not present

## 2021-03-03 DIAGNOSIS — N2581 Secondary hyperparathyroidism of renal origin: Secondary | ICD-10-CM | POA: Diagnosis not present

## 2021-03-03 DIAGNOSIS — N186 End stage renal disease: Secondary | ICD-10-CM | POA: Diagnosis not present

## 2021-03-03 DIAGNOSIS — Z992 Dependence on renal dialysis: Secondary | ICD-10-CM | POA: Diagnosis not present

## 2021-03-03 MED ORDER — FLECAINIDE ACETATE 50 MG PO TABS: 50.0000 mg | ORAL_TABLET | Freq: Two times a day (BID) | ORAL | 1 refills | Status: AC

## 2021-03-04 ENCOUNTER — Other Ambulatory Visit: Payer: Self-pay

## 2021-03-04 ENCOUNTER — Ambulatory Visit (HOSPITAL_COMMUNITY)
Admission: RE | Admit: 2021-03-04 | Discharge: 2021-03-04 | Disposition: A | Discharge status: Home / Self Care | Payer: Medicare PPO | Source: Ambulatory Visit | Attending: Vascular Surgery | Admitting: Vascular Surgery

## 2021-03-04 ENCOUNTER — Ambulatory Visit (INDEPENDENT_AMBULATORY_CARE_PROVIDER_SITE_OTHER): Payer: Medicare PPO | Admitting: Vascular Surgery

## 2021-03-04 ENCOUNTER — Encounter: Payer: Self-pay | Admitting: Vascular Surgery

## 2021-03-04 VITALS — BP 84/47 | HR 64 | Temp 98.2°F | Resp 16 | Ht 60.0 in | Wt 118.0 lb

## 2021-03-04 DIAGNOSIS — N186 End stage renal disease: Secondary | ICD-10-CM

## 2021-03-04 DIAGNOSIS — N2581 Secondary hyperparathyroidism of renal origin: Secondary | ICD-10-CM | POA: Diagnosis not present

## 2021-03-04 DIAGNOSIS — Z992 Dependence on renal dialysis: Secondary | ICD-10-CM

## 2021-03-04 DIAGNOSIS — N189 Chronic kidney disease, unspecified: Secondary | ICD-10-CM | POA: Diagnosis not present

## 2021-03-04 NOTE — Progress Notes (Signed)
Patient name: Tiffany [name] MRN: [id number] DOB: 02/26/1946 Sex: female  REASON FOR CONSULT: F/U after left 2nd stage BVT  HPI: Tiffany [name] is a 75 y.o. 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 fib status post ablation, stage ESRD, and CREST syndrome that presents for postop check after left second stage BVT.  Her most recent surgery was done on 02/14/2021.  She states that her sharp pains at her incisions are now improved to a dull pain over the last couple days.  She has some mild numbness in the hand that is tolerable.  Grip strength is good.   Past Medical History:  Diagnosis Date   Anemia    Arthritis 04/20/2012   Osteoarthritis-right hip   Atrial fibrillation status post cardioversion, 01/30/15 maintaining SR.  01/31/2015   a. 01/2015 s/p TEE/DCCV;  b. 02/06/2015 recurrent AF noted, amio added;  c. CHA2DS2VASc = 3-->eliquis.   BENIGN POSITIONAL VERTIGO    Cataract    CHF (congestive heart failure) (HCC)    Chronic kidney disease    Complication of anesthesia 04/20/2012   some issues with prolonged sedation after anesthesia   Diverticulosis    DYSLIPIDEMIA    Dyspnea    Dysrhythmia    Esophageal stricture    GERD    HYPERTENSION    a. severe, pt intol of med tx, Pt. has severe "whitecoat" syndrome and refused medical therapy.   Hypothyroidism 09/29/2014   a. pt did not tolerate synthroid and this was subsequently discontinued.   Mild LV dysfunction    a. 01/2015 Echo: EF 45-50%, mild MR, mildly dil LA, mod dil RV with mod to sev reduced fxn, mod-sev TR, PASP 57mHg.   Osteopenia    Pneumonia    Pulmonary hypertension (HCC)    Raynaud disease    CREST   Sleep apnea    Umbilical hernia    URINARY INCONTINENCE 04/20/2012   occ. with nighttime sleep pattern    Past Surgical History:  Procedure Laterality Date   ATRIAL FIBRILLATION ABLATION N/A 01/26/2017   Procedure: Atrial  Fibrillation Ablation;  Surgeon: AThompson Grayer MD;  [facility];  Service: Cardiovascular;  Laterality: N/A;   BASCILIC VEIN TRANSPOSITION Left 12/23/2020   Procedure: LEFT FIRST [place]AGE [place]  Surgeon: CMarty Heck MD;  [place]  Service: Vascular;  Laterality: Left;   [place] 02/14[place]20[place]   Procedure: LEFT ARM SECOND STAGE BASILIC VEIN TRANSPOSITION;  Surgeon: CMarty Heck MD;  [place]  Service: Vascular;  Laterality: Left;   breast ultrasound Right 09/13/2013   There is no sonographic evidence of malignancy. the 0123XX123complicated cyst in (R) breast is consistent with a benign finding. repeat in 1 year   CARDIAC CATHETERIZATION N/A 08/29/2015   Procedure: Right Heart Cath;  Surgeon: DLarey Dresser MD;  [facility];  Service: Cardiovascular;  Laterality: N/A;   CARDIOVERSION N/A 01/30/2015   Procedure: CARDIOVERSION;  Surgeon: MSanda Klein MD;  [facility];  Service: Cardiovascular;  Laterality: N/A;   CATARACT EXTRACTION, BILATERAL     CHOLECYSTECTOMY     '90-"sludge"   EYE SURGERY     JOINT REPLACEMENT     NASAL FRACTURE SURGERY  2006   RIGHT HEART CATH N/A 06/30/2017   Procedure: RIGHT HEART CATH;  Surgeon: MLarey Dresser MD;  [facility];  Service: Cardiovascular;  Laterality:  N/A;   RIGHT HEART CATH N/A 08/09/2020   Procedure: RIGHT HEART CATH;  Surgeon: Larey Dresser, MD;  Location: [place] [facility];  Service: Cardiovascular;  Laterality: N/A;   TEE WITHOUT CARDIOVERSION N/A 01/30/2015   Procedure: TRANSESOPHAGEAL ECHOCARDIOGRAM (TEE);  Surgeon: Sanda Klein, MD;  Location: [facility] END[facility]COPY;  Service: Cardiovascular;  Laterality: N/A;   TEE WITHOUT CARDIOVERSION N/A 01/25/2017   Procedure: TRANSESOPHAGEAL ECHOCARDIOGRAM (TEE);  Surgeon: Fay Records, MD;  Location: [facility] ENDOSCOPY;  Service: Cardiovascular;  Laterality: N/A;   TOTAL HIP  ARTHROPLASTY  04/26/2012   Procedure: TOTAL HIP ARTHROPLASTY ANTERIOR APPROACH;  Surgeon: Mauri Pole, MD;  [facility]: [facility];  Service: Orthopedics;  Laterality: Right;   TUBAL LIGATION  1980    Family History  Adopted: Yes  Problem Relation Age of Onset   Other Other        patient was adopted    SOCIAL HISTORY: Social History   Socioeconomic History   Marital status: Married    Spouse name: [name]   Number of children: 2   Years of education: Not on file   Highest education level: Not on file  Occupational History   Occupation: retired  Tobacco Use   Smoking status: Never   Smokeless tobacco: Never  Vaping Use   Vaping Use: Never used  Substance and Sexual Activity   Alcohol use: Yes    Comment: less than 1 drink per week   Drug use: No   Sexual activity: Yes  Other Topics Concern   Not on file  Social History Narrative   She enjoys birding.  Lives at home with husband.   Married.   retired Quarry manager, Tax inspector   Social Determinants of Radio broadcast assistant Strain: Not on [employer] Insecurity: Not on file  Transportation Needs: Not on file  Physical Activity: Not on file  Stress: Not on file  Social Connections: Not on file  Intimate Partner Violence: Not on file    Allergies  Allergen Reactions   Levothyroxine Shortness Of Breath and Other (See Comments)    Exhaustion also - Per pt her this medication could have not been the reason for her symptoms   Statins Other (See Comments)    "Pain, weakness and kidney problems"   Prednisone Other (See Comments)    Severe insomnia   Penicillins Other (See Comments)    dry mouth Has patient had a PCN reaction causing immediate rash, facial/tongue/throat swelling, SOB or lightheadedness with hypotension: No Has patient had a PCN reaction causing severe rash involving mucus membranes or skin necrosis: No Has patient had a PCN reaction that required hospitalization No Has patient  had a PCN reaction occurring within the last 10 years: No If all of the above answers are "NO", then may proceed with Cephalosporin use.     Current Outpatient Medications  Medication Sig Dispense Refill   acetaminophen (TYLENOL) 325 MG tablet Take 2 tablets (650 mg total) by mouth every 4 (four) hours as needed for headache or mild pain.     ELIQUIS 5 MG TABS tablet TAKE 1 TABLET BY MOUTH TWICE A DAY (Patient taking differently: Take 5 mg by mouth 2 (two) times daily.) 180 tablet 1   famotidine (PEPCID) 20 MG tablet TAKE 1 TABLET BY MOUTH TWICE A DAY (Patient taking differently: Take 20 mg by mouth 2 (two) times daily.) 180 tablet 3   flecainide (TAMBOCOR) 50 MG tablet Take 1 tablet (50  mg total) by mouth 2 (two) times daily. 180 tablet 1   fluticasone (FLONASE) 50 MCG/ACT nasal spray Place 1 spray daily as needed into both nostrils for allergies or rhinitis.     macitentan (OPSUMIT) 10 MG tablet Take 1 tablet (10 mg total) by mouth daily. 30 tablet 11   midodrine (PROAMATINE) 5 MG tablet Take 1 tablet (5 mg total) by mouth 3 (three) times daily with meals. 90 tablet 3   oxyCODONE-acetaminophen (PERCOCET) 5-325 MG tablet Take 1 tablet by mouth every 8 (eight) hours as needed for severe pain. 9 tablet 0   Probiotic Product (PROBIOTIC-10) CAPS Take 1 capsule by mouth every Monday, Wednesday, and Friday.     Riociguat (ADEMPAS) 2.5 MG TABS Take 2.5 mg by mouth 2 (two) times daily.     Selexipag (UPTRAVI) 1200 MCG TABS Take 2 tablets by mouth in the morning and at bedtime. (Patient taking differently: Take 2,400 mcg by mouth in the morning and at bedtime.) 120 tablet 11   No current facility-administered medications for this visit.    REVIEW OF SYSTEMS:  '[X]'$  denotes positive finding, '[ ]'$  denotes negative finding Cardiac  Comments:  Chest pain or chest pressure:    Shortness of breath upon exertion:    Short of breath when lying flat:    Irregular heart rhythm:        Vascular    Pain in  calf, thigh, or hip brought on by ambulation:    Pain in feet at night that wakes you up from your sleep:     Blood clot in your veins:    Leg swelling:         Pulmonary    Oxygen at home:    Productive cough:     Wheezing:         Neurologic    Sudden weakness in arms or legs:     Sudden numbness in arms or legs:     Sudden onset of difficulty speaking or slurred speech:    Temporary loss of vision in one eye:     Problems with dizziness:         Gastrointestinal    Blood in stool:     Vomited blood:         Genitourinary    Burning when urinating:     Blood in urine:        Psychiatric    Major depression:         Hematologic    Bleeding problems:    Problems with blood clotting too easily:        Skin    Rashes or ulcers:        Constitutional    Fever or chills:      PHYSICAL EXAM: Vitals:   03/04/21 0840  BP: (!) 84/47  Pulse: 64  Resp: 16  Temp: 98.2 F (36.8 C)  TempSrc: Temporal  SpO2: 93%  Weight: 118 lb (53.5 kg)  Height: 5' (1.524 m)    GENERAL: The patient is a well-nourished female, in no acute distress. The vital signs are documented above. CARDIAC: There is a regular rate and rhythm.  VASCULAR:  Left upper arm incisions are healing with no hematoma Excellent thrill in the fistula 1+ radial pulse at the wrist Good grip strength left hand   DATA:   Left upper extremity fistula duplex shows a patent basilic vein fistula and this measures 5 to 6 mm in the mid to distal upper arm  and AC fossa.  Assessment/Plan:  75 year old female with end-stage renal disease who presents for postop check after a left second stage basilic vein transposition on 02/14/2021.  The fistula has a good thrill on exam.  She does have 1+ radial pulse at the wrist.  Fistula looks patent by duplex and I think this can be accessed 4 to 6 weeks postop as I discussed with her and her husband today.  We discussed that potentially could be issues with infiltration if this  is accessed too early.  Ultimately we came to the agreement that we will allow her to start using this at the end of August when she is 6 weeks postop.  Hopefully her catheter can be removed after this timeline.  Discussed her nephrologist can let us know if there are any issues with the fistula.   [name], MD [facility] [place] Office: [phone]

## 2021-03-06 DIAGNOSIS — N189 Chronic kidney disease, unspecified: Secondary | ICD-10-CM | POA: Diagnosis not present

## 2021-03-06 DIAGNOSIS — N186 End stage renal disease: Secondary | ICD-10-CM | POA: Diagnosis not present

## 2021-03-06 DIAGNOSIS — N2581 Secondary hyperparathyroidism of renal origin: Secondary | ICD-10-CM | POA: Diagnosis not present

## 2021-03-06 DIAGNOSIS — Z992 Dependence on renal dialysis: Secondary | ICD-10-CM | POA: Diagnosis not present

## 2021-03-07 DIAGNOSIS — N189 Chronic kidney disease, unspecified: Secondary | ICD-10-CM | POA: Diagnosis not present

## 2021-03-07 DIAGNOSIS — Z992 Dependence on renal dialysis: Secondary | ICD-10-CM | POA: Diagnosis not present

## 2021-03-07 DIAGNOSIS — N186 End stage renal disease: Secondary | ICD-10-CM | POA: Diagnosis not present

## 2021-03-07 DIAGNOSIS — N2581 Secondary hyperparathyroidism of renal origin: Secondary | ICD-10-CM | POA: Diagnosis not present

## 2021-03-10 DIAGNOSIS — I272 Pulmonary hypertension, unspecified: Secondary | ICD-10-CM | POA: Diagnosis not present

## 2021-03-10 DIAGNOSIS — N186 End stage renal disease: Secondary | ICD-10-CM | POA: Diagnosis not present

## 2021-03-10 DIAGNOSIS — N2581 Secondary hyperparathyroidism of renal origin: Secondary | ICD-10-CM | POA: Diagnosis not present

## 2021-03-10 DIAGNOSIS — N189 Chronic kidney disease, unspecified: Secondary | ICD-10-CM | POA: Diagnosis not present

## 2021-03-10 DIAGNOSIS — Z992 Dependence on renal dialysis: Secondary | ICD-10-CM | POA: Diagnosis not present

## 2021-03-11 DIAGNOSIS — N189 Chronic kidney disease, unspecified: Secondary | ICD-10-CM | POA: Diagnosis not present

## 2021-03-11 DIAGNOSIS — N2581 Secondary hyperparathyroidism of renal origin: Secondary | ICD-10-CM | POA: Diagnosis not present

## 2021-03-11 DIAGNOSIS — Z992 Dependence on renal dialysis: Secondary | ICD-10-CM | POA: Diagnosis not present

## 2021-03-11 DIAGNOSIS — N186 End stage renal disease: Secondary | ICD-10-CM | POA: Diagnosis not present

## 2021-03-12 ENCOUNTER — Telehealth (HOSPITAL_COMMUNITY): Payer: Self-pay | Admitting: *Deleted

## 2021-03-12 NOTE — Telephone Encounter (Signed)
Pt left vm stating her systolic bp runs in the 0000000 during dialysis. Pt was asked to contact our office to see if we could make medication adjustments on her dialysis days.   Routed to [employer] for advice

## 2021-03-12 NOTE — Telephone Encounter (Signed)
Pt said she does not take metoprolol or midodrine. Pt and her husband went over all medications. Medication list updated. Please advise.

## 2021-03-13 DIAGNOSIS — N189 Chronic kidney disease, unspecified: Secondary | ICD-10-CM | POA: Diagnosis not present

## 2021-03-13 DIAGNOSIS — N2581 Secondary hyperparathyroidism of renal origin: Secondary | ICD-10-CM | POA: Diagnosis not present

## 2021-03-13 DIAGNOSIS — N186 End stage renal disease: Secondary | ICD-10-CM | POA: Diagnosis not present

## 2021-03-13 DIAGNOSIS — Z992 Dependence on renal dialysis: Secondary | ICD-10-CM | POA: Diagnosis not present

## 2021-03-13 NOTE — Telephone Encounter (Signed)
Called pt no answer. Will try pt again later.

## 2021-03-14 DIAGNOSIS — N189 Chronic kidney disease, unspecified: Secondary | ICD-10-CM | POA: Diagnosis not present

## 2021-03-14 DIAGNOSIS — Z992 Dependence on renal dialysis: Secondary | ICD-10-CM | POA: Diagnosis not present

## 2021-03-14 DIAGNOSIS — N2581 Secondary hyperparathyroidism of renal origin: Secondary | ICD-10-CM | POA: Diagnosis not present

## 2021-03-14 DIAGNOSIS — N186 End stage renal disease: Secondary | ICD-10-CM | POA: Diagnosis not present

## 2021-03-17 DIAGNOSIS — N189 Chronic kidney disease, unspecified: Secondary | ICD-10-CM | POA: Diagnosis not present

## 2021-03-17 DIAGNOSIS — N2581 Secondary hyperparathyroidism of renal origin: Secondary | ICD-10-CM | POA: Diagnosis not present

## 2021-03-17 DIAGNOSIS — Z992 Dependence on renal dialysis: Secondary | ICD-10-CM | POA: Diagnosis not present

## 2021-03-17 DIAGNOSIS — N186 End stage renal disease: Secondary | ICD-10-CM | POA: Diagnosis not present

## 2021-03-18 DIAGNOSIS — N186 End stage renal disease: Secondary | ICD-10-CM | POA: Diagnosis not present

## 2021-03-18 DIAGNOSIS — N2581 Secondary hyperparathyroidism of renal origin: Secondary | ICD-10-CM | POA: Diagnosis not present

## 2021-03-18 DIAGNOSIS — N189 Chronic kidney disease, unspecified: Secondary | ICD-10-CM | POA: Diagnosis not present

## 2021-03-18 DIAGNOSIS — Z992 Dependence on renal dialysis: Secondary | ICD-10-CM | POA: Diagnosis not present

## 2021-03-20 DIAGNOSIS — N2581 Secondary hyperparathyroidism of renal origin: Secondary | ICD-10-CM | POA: Diagnosis not present

## 2021-03-20 DIAGNOSIS — N186 End stage renal disease: Secondary | ICD-10-CM | POA: Diagnosis not present

## 2021-03-20 DIAGNOSIS — N189 Chronic kidney disease, unspecified: Secondary | ICD-10-CM | POA: Diagnosis not present

## 2021-03-20 DIAGNOSIS — Z992 Dependence on renal dialysis: Secondary | ICD-10-CM | POA: Diagnosis not present

## 2021-03-20 NOTE — Telephone Encounter (Signed)
2nd attempt to reach pt.  No answer. 

## 2021-03-21 DIAGNOSIS — Z992 Dependence on renal dialysis: Secondary | ICD-10-CM | POA: Diagnosis not present

## 2021-03-21 DIAGNOSIS — N2581 Secondary hyperparathyroidism of renal origin: Secondary | ICD-10-CM | POA: Diagnosis not present

## 2021-03-21 DIAGNOSIS — N189 Chronic kidney disease, unspecified: Secondary | ICD-10-CM | POA: Diagnosis not present

## 2021-03-21 DIAGNOSIS — N186 End stage renal disease: Secondary | ICD-10-CM | POA: Diagnosis not present

## 2021-03-24 ENCOUNTER — Inpatient Hospital Stay (HOSPITAL_BASED_OUTPATIENT_CLINIC_OR_DEPARTMENT_OTHER)
Admission: EM | Admit: 2021-03-24 | Discharge: 2021-04-03 | DRG: 640 | Disposition: E | Payer: Medicare PPO | Attending: Family Medicine | Admitting: Family Medicine

## 2021-03-24 ENCOUNTER — Other Ambulatory Visit: Payer: Self-pay

## 2021-03-24 ENCOUNTER — Emergency Department (HOSPITAL_BASED_OUTPATIENT_CLINIC_OR_DEPARTMENT_OTHER): Payer: Medicare PPO

## 2021-03-24 ENCOUNTER — Encounter (HOSPITAL_BASED_OUTPATIENT_CLINIC_OR_DEPARTMENT_OTHER): Payer: Self-pay

## 2021-03-24 DIAGNOSIS — I493 Ventricular premature depolarization: Secondary | ICD-10-CM | POA: Diagnosis present

## 2021-03-24 DIAGNOSIS — Z79899 Other long term (current) drug therapy: Secondary | ICD-10-CM

## 2021-03-24 DIAGNOSIS — N186 End stage renal disease: Secondary | ICD-10-CM | POA: Diagnosis present

## 2021-03-24 DIAGNOSIS — J9621 Acute and chronic respiratory failure with hypoxia: Secondary | ICD-10-CM | POA: Diagnosis present

## 2021-03-24 DIAGNOSIS — E873 Alkalosis: Secondary | ICD-10-CM | POA: Diagnosis not present

## 2021-03-24 DIAGNOSIS — Z96641 Presence of right artificial hip joint: Secondary | ICD-10-CM | POA: Diagnosis present

## 2021-03-24 DIAGNOSIS — E039 Hypothyroidism, unspecified: Secondary | ICD-10-CM | POA: Diagnosis present

## 2021-03-24 DIAGNOSIS — Z7901 Long term (current) use of anticoagulants: Secondary | ICD-10-CM

## 2021-03-24 DIAGNOSIS — K219 Gastro-esophageal reflux disease without esophagitis: Secondary | ICD-10-CM | POA: Diagnosis present

## 2021-03-24 DIAGNOSIS — J90 Pleural effusion, not elsewhere classified: Secondary | ICD-10-CM | POA: Diagnosis not present

## 2021-03-24 DIAGNOSIS — R590 Localized enlarged lymph nodes: Secondary | ICD-10-CM | POA: Diagnosis not present

## 2021-03-24 DIAGNOSIS — Z888 Allergy status to other drugs, medicaments and biological substances status: Secondary | ICD-10-CM

## 2021-03-24 DIAGNOSIS — I462 Cardiac arrest due to underlying cardiac condition: Secondary | ICD-10-CM | POA: Diagnosis not present

## 2021-03-24 DIAGNOSIS — R54 Age-related physical debility: Secondary | ICD-10-CM | POA: Diagnosis present

## 2021-03-24 DIAGNOSIS — D631 Anemia in chronic kidney disease: Secondary | ICD-10-CM | POA: Diagnosis present

## 2021-03-24 DIAGNOSIS — Z992 Dependence on renal dialysis: Secondary | ICD-10-CM

## 2021-03-24 DIAGNOSIS — M8580 Other specified disorders of bone density and structure, unspecified site: Secondary | ICD-10-CM | POA: Diagnosis present

## 2021-03-24 DIAGNOSIS — J9601 Acute respiratory failure with hypoxia: Secondary | ICD-10-CM

## 2021-03-24 DIAGNOSIS — I959 Hypotension, unspecified: Secondary | ICD-10-CM | POA: Diagnosis not present

## 2021-03-24 DIAGNOSIS — Z20822 Contact with and (suspected) exposure to covid-19: Secondary | ICD-10-CM | POA: Diagnosis present

## 2021-03-24 DIAGNOSIS — I071 Rheumatic tricuspid insufficiency: Secondary | ICD-10-CM | POA: Diagnosis present

## 2021-03-24 DIAGNOSIS — I9589 Other hypotension: Secondary | ICD-10-CM | POA: Diagnosis present

## 2021-03-24 DIAGNOSIS — G4733 Obstructive sleep apnea (adult) (pediatric): Secondary | ICD-10-CM | POA: Diagnosis present

## 2021-03-24 DIAGNOSIS — J811 Chronic pulmonary edema: Secondary | ICD-10-CM | POA: Diagnosis not present

## 2021-03-24 DIAGNOSIS — I4891 Unspecified atrial fibrillation: Secondary | ICD-10-CM | POA: Diagnosis present

## 2021-03-24 DIAGNOSIS — J9691 Respiratory failure, unspecified with hypoxia: Secondary | ICD-10-CM | POA: Diagnosis not present

## 2021-03-24 DIAGNOSIS — I480 Paroxysmal atrial fibrillation: Secondary | ICD-10-CM | POA: Diagnosis present

## 2021-03-24 DIAGNOSIS — Z880 Allergy status to penicillin: Secondary | ICD-10-CM

## 2021-03-24 DIAGNOSIS — I270 Primary pulmonary hypertension: Secondary | ICD-10-CM | POA: Diagnosis not present

## 2021-03-24 DIAGNOSIS — M542 Cervicalgia: Secondary | ICD-10-CM | POA: Diagnosis present

## 2021-03-24 DIAGNOSIS — D649 Anemia, unspecified: Secondary | ICD-10-CM | POA: Diagnosis not present

## 2021-03-24 DIAGNOSIS — E871 Hypo-osmolality and hyponatremia: Secondary | ICD-10-CM | POA: Diagnosis present

## 2021-03-24 DIAGNOSIS — I2721 Secondary pulmonary arterial hypertension: Secondary | ICD-10-CM | POA: Diagnosis present

## 2021-03-24 DIAGNOSIS — I5042 Chronic combined systolic (congestive) and diastolic (congestive) heart failure: Secondary | ICD-10-CM | POA: Diagnosis present

## 2021-03-24 DIAGNOSIS — I132 Hypertensive heart and chronic kidney disease with heart failure and with stage 5 chronic kidney disease, or end stage renal disease: Secondary | ICD-10-CM | POA: Diagnosis present

## 2021-03-24 DIAGNOSIS — N250 Renal osteodystrophy: Secondary | ICD-10-CM | POA: Diagnosis not present

## 2021-03-24 DIAGNOSIS — J9600 Acute respiratory failure, unspecified whether with hypoxia or hypercapnia: Secondary | ICD-10-CM | POA: Diagnosis present

## 2021-03-24 DIAGNOSIS — E8770 Fluid overload, unspecified: Secondary | ICD-10-CM | POA: Diagnosis present

## 2021-03-24 DIAGNOSIS — J9811 Atelectasis: Secondary | ICD-10-CM | POA: Diagnosis not present

## 2021-03-24 DIAGNOSIS — J810 Acute pulmonary edema: Secondary | ICD-10-CM | POA: Diagnosis not present

## 2021-03-24 DIAGNOSIS — M341 CR(E)ST syndrome: Secondary | ICD-10-CM | POA: Diagnosis present

## 2021-03-24 DIAGNOSIS — R0602 Shortness of breath: Secondary | ICD-10-CM | POA: Diagnosis not present

## 2021-03-24 DIAGNOSIS — E785 Hyperlipidemia, unspecified: Secondary | ICD-10-CM | POA: Diagnosis present

## 2021-03-24 DIAGNOSIS — I5022 Chronic systolic (congestive) heart failure: Secondary | ICD-10-CM | POA: Diagnosis not present

## 2021-03-24 DIAGNOSIS — R0902 Hypoxemia: Secondary | ICD-10-CM

## 2021-03-24 DIAGNOSIS — I517 Cardiomegaly: Secondary | ICD-10-CM | POA: Diagnosis not present

## 2021-03-24 DIAGNOSIS — E8779 Other fluid overload: Secondary | ICD-10-CM | POA: Diagnosis not present

## 2021-03-24 DIAGNOSIS — I50810 Right heart failure, unspecified: Secondary | ICD-10-CM | POA: Diagnosis not present

## 2021-03-24 DIAGNOSIS — R079 Chest pain, unspecified: Secondary | ICD-10-CM | POA: Diagnosis not present

## 2021-03-24 LAB — BASIC METABOLIC PANEL
Anion gap: 14 (ref 5–15)
BUN: 45 mg/dL — ABNORMAL HIGH (ref 8–23)
CO2: 27 mmol/L (ref 22–32)
Calcium: 9.1 mg/dL (ref 8.9–10.3)
Chloride: 92 mmol/L — ABNORMAL LOW (ref 98–111)
Creatinine, Ser: 5.74 mg/dL — ABNORMAL HIGH (ref 0.44–1.00)
GFR, Estimated: 7 mL/min — ABNORMAL LOW (ref >60)
Glucose, Bld: 95 mg/dL (ref 70–99)
Potassium: 3.1 mmol/L — ABNORMAL LOW (ref 3.5–5.1)
Sodium: 133 mmol/L — ABNORMAL LOW (ref 135–145)

## 2021-03-24 LAB — CBC WITH DIFFERENTIAL/PLATELET
Abs Immature Granulocytes: 0.02 10*3/uL (ref 0.00–0.07)
Basophils Absolute: 0 10*3/uL (ref 0.0–0.1)
Basophils Relative: 0 %
Eosinophils Absolute: 0 10*3/uL (ref 0.0–0.5)
Eosinophils Relative: 0 %
HCT: 26.7 % — ABNORMAL LOW (ref 36.0–46.0)
Hemoglobin: 8.3 g/dL — ABNORMAL LOW (ref 12.0–15.0)
Immature Granulocytes: 0 %
Lymphocytes Relative: 9 %
Lymphs Abs: 0.4 10*3/uL — ABNORMAL LOW (ref 0.7–4.0)
MCH: 28.8 pg (ref 26.0–34.0)
MCHC: 31.1 g/dL (ref 30.0–36.0)
MCV: 92.7 fL (ref 80.0–100.0)
Monocytes Absolute: 0.2 10*3/uL (ref 0.1–1.0)
Monocytes Relative: 5 %
Neutro Abs: 3.9 10*3/uL (ref 1.7–7.7)
Neutrophils Relative %: 86 %
Platelets: 167 10*3/uL (ref 150–400)
RBC: 2.88 MIL/uL — ABNORMAL LOW (ref 3.87–5.11)
RDW: 21.3 % — ABNORMAL HIGH (ref 11.5–15.5)
WBC: 4.5 10*3/uL (ref 4.0–10.5)
nRBC: 0 % (ref 0.0–0.2)

## 2021-03-24 LAB — RESP PANEL BY RT-PCR (FLU A&B, COVID) ARPGX2
Influenza A by PCR: NEGATIVE
Influenza B by PCR: NEGATIVE
SARS Coronavirus 2 by RT PCR: NEGATIVE

## 2021-03-24 LAB — D-DIMER, QUANTITATIVE: D-Dimer, Quant: 0.93 ug/mL-FEU — ABNORMAL HIGH (ref 0.00–0.50)

## 2021-03-24 LAB — TROPONIN I (HIGH SENSITIVITY)
Troponin I (High Sensitivity): 16 ng/L (ref <18)
Troponin I (High Sensitivity): 9 ng/L (ref <18)

## 2021-03-24 LAB — MAGNESIUM: Magnesium: 2 mg/dL (ref 1.7–2.4)

## 2021-03-24 LAB — BRAIN NATRIURETIC PEPTIDE: B Natriuretic Peptide: 2088.5 pg/mL — ABNORMAL HIGH (ref 0.0–100.0)

## 2021-03-24 MED ORDER — IOHEXOL 350 MG/ML SOLN
50.0000 mL | Freq: Once | INTRAVENOUS | Status: AC | PRN
  Administered 2021-03-24: 50 mL via INTRAVENOUS

## 2021-03-24 MED ORDER — ACETAMINOPHEN 325 MG PO TABS
650.0000 mg | ORAL_TABLET | ORAL | Status: DC | PRN
  Administered 2021-03-25 (×3): 650 mg via ORAL
  Filled 2021-03-24 (×3): qty 2

## 2021-03-24 MED ORDER — APIXABAN 2.5 MG PO TABS
2.5000 mg | ORAL_TABLET | Freq: Two times a day (BID) | ORAL | Status: DC
  Administered 2021-03-25 – 2021-03-26 (×4): 2.5 mg via ORAL
  Filled 2021-03-24 (×4): qty 1

## 2021-03-24 MED ORDER — SELEXIPAG 1200 MCG PO TABS: 2400.0000 ug | ORAL_TABLET | Freq: Two times a day (BID) | ORAL | Status: DC

## 2021-03-24 MED ORDER — RIOCIGUAT 2.5 MG PO TABS: 2.5000 mg | ORAL_TABLET | Freq: Two times a day (BID) | ORAL | Status: DC

## 2021-03-24 MED ORDER — MIDODRINE HCL 5 MG PO TABS
5.0000 mg | ORAL_TABLET | ORAL | Status: DC
  Administered 2021-03-25 (×3): 5 mg via ORAL
  Filled 2021-03-24 (×3): qty 1

## 2021-03-24 MED ORDER — FAMOTIDINE 20 MG PO TABS
20.0000 mg | ORAL_TABLET | Freq: Two times a day (BID) | ORAL | Status: DC
  Administered 2021-03-25 – 2021-03-26 (×4): 20 mg via ORAL
  Filled 2021-03-24 (×4): qty 1

## 2021-03-24 MED ORDER — ONDANSETRON HCL 4 MG PO TABS: 4.0000 mg | ORAL_TABLET | Freq: Four times a day (QID) | ORAL | Status: DC | PRN

## 2021-03-24 MED ORDER — FENTANYL CITRATE (PF) 100 MCG/2ML IJ SOLN
25.0000 ug | Freq: Once | INTRAMUSCULAR | Status: AC
  Administered 2021-03-24: 25 ug via INTRAVENOUS
  Filled 2021-03-24: qty 2

## 2021-03-24 MED ORDER — FLUTICASONE PROPIONATE 50 MCG/ACT NA SUSP
1.0000 | Freq: Every day | NASAL | Status: DC | PRN
  Filled 2021-03-24: qty 16

## 2021-03-24 MED ORDER — MACITENTAN 10 MG PO TABS
10.0000 mg | ORAL_TABLET | Freq: Every day | ORAL | Status: DC
  Administered 2021-03-25 – 2021-03-26 (×2): 10 mg via ORAL
  Filled 2021-03-24 (×2): qty 1

## 2021-03-24 MED ORDER — FLECAINIDE ACETATE 50 MG PO TABS
50.0000 mg | ORAL_TABLET | Freq: Two times a day (BID) | ORAL | Status: DC
  Administered 2021-03-25 – 2021-03-26 (×4): 50 mg via ORAL
  Filled 2021-03-24 (×5): qty 1

## 2021-03-24 MED ORDER — ONDANSETRON HCL 4 MG/2ML IJ SOLN: 4.0000 mg | Freq: Four times a day (QID) | INTRAMUSCULAR | Status: DC | PRN

## 2021-03-24 MED ORDER — SACCHAROMYCES BOULARDII 250 MG PO CAPS
250.0000 mg | ORAL_CAPSULE | ORAL | Status: DC
  Administered 2021-03-26: 250 mg via ORAL
  Filled 2021-03-24: qty 1

## 2021-03-24 NOTE — ED Notes (Signed)
[facility]link arrived -  Pt has been transported to  [facility] - pt has been running low - charge and EDP are aware about it

## 2021-03-24 NOTE — ED Notes (Signed)
MD at bedside. BP 84/46, pt sob during movement but denies pain. RT notified

## 2021-03-24 NOTE — ED Notes (Signed)
Patient transported to CT 

## 2021-03-24 NOTE — ED Notes (Signed)
Patient wears 3LNC at baseline. On 4LNC, SpO2 ranging from 84-89%. Literflow increased to [facility] at this time.

## 2021-03-24 NOTE — ED Notes (Signed)
Report given to Donah Driver RN at [facility]

## 2021-03-24 NOTE — ED Triage Notes (Signed)
Pt arrives POC, c/o of neck pain, mostly on the right posterior for approximately 8 weeks.  Pt states she started hemodialysis about 8 weeks ago and attributes neck pain to the chair she sits in for HD.  She has tried heat and Tylenol without significant relief.

## 2021-03-24 NOTE — H&P (Addendum)
History and Physical    Tiffany [name] [id number] DOB: 04/29/1946 DOA: 03/23/2021  PCP: [name], MD  Patient coming from: Home  I have personally briefly reviewed patient's old medical records in [place]  Chief Complaint: Neck pain  HPI: Tiffany [name] is a 75 y.o. female with medical history significant of Severe PAH, ESRD on MTTF dialysis, PAF on Eliquis.  Pt presents to ED with 8 weeks of neck pain, constant.  Attributes neck pain to posture while getting HD.  Taking tylenol with minimal relief.  No fevers, chills.  Missed dialysis today due to neck pain.   ED Course: Normally on 3L O2 via [place], needing 5L today to maintain sats.  COVID neg  BNP 2000  CTA PE: 1) no PE 2) has PAH and R sided CHF 3) has mediastinal lymphadenopathy, might just be related to CHF, also has mass like consolidation of RLL.  Follow up CT in 3 months recd.  Pt transferred to [facility] for volume overload.   Review of Systems: As per HPI, otherwise all review of systems negative.  Past Medical History:  Diagnosis Date   Anemia    Arthritis 04/20/2012   Osteoarthritis-right hip   Atrial fibrillation status post cardioversion, 01/30/15 maintaining SR.  01/31/2015   a. 01/2015 s/p TEE/DCCV;  b. 02/06/2015 recurrent AF noted, amio added;  c. CHA2DS2VASc = 3-->eliquis.   BENIGN POSITIONAL VERTIGO    Cataract    CHF (congestive heart failure) (HCC)    Chronic kidney disease    Complication of anesthesia 04/20/2012   some issues with prolonged sedation after anesthesia   Diverticulosis    DYSLIPIDEMIA    Dyspnea    Dysrhythmia    Esophageal stricture    GERD    HYPERTENSION    a. severe, pt intol of med tx, Pt. has severe "whitecoat" syndrome and refused medical therapy.   Hypothyroidism 09/29/2014   a. pt did not tolerate synthroid and this was subsequently discontinued.   Mild LV dysfunction    a. 01/2015 Echo: EF 45-50%, mild MR, mildly dil LA, mod dil RV with mod to sev  reduced fxn, mod-sev TR, PASP 28mHg.   Osteopenia    Pneumonia    Pulmonary hypertension (HCC)    Raynaud disease    CREST   Sleep apnea    Umbilical hernia    URINARY INCONTINENCE 04/20/2012   occ. with nighttime sleep pattern    Past Surgical History:  Procedure Laterality Date   ATRIAL FIBRILLATION ABLATION N/A 01/26/2017   Procedure: Atrial Fibrillation Ablation;  Surgeon: AThompson Grayer MD;  [facility];  Service: Cardiovascular;  Laterality: N/A;   BASCILIC VEIN TRANSPOSITION Left 12/23/2020   Procedure: LEFT FIRST [place]AGE [place]  Surgeon: CMarty Heck MD;  [place] [facility]  Service: Vascular;  Laterality: Left;   [place]   Procedure: LEFT ARM SECOND STAGE BASILIC VEIN TRANSPOSITION;  Surgeon: CMarty Heck MD;  [place] [facility]  Service: Vascular;  Laterality: Left;   breast ultrasound Right 09/13/2013   There is no sonographic evidence of malignancy. the [id number] cyst in (R) breast is consistent with a benign finding. repeat in 1 year   CARDIAC CATHETERIZATION N/A 08/29/2015   Procedure: Right Heart Cath;  Surgeon: DLarey Dresser MD;  [facility];  Service: Cardiovascular;  Laterality: N/A;   CARDIOVERSION N/A 01/30/2015   Procedure: CARDIOVERSION;  Surgeon: MSanda Klein MD;  Location: [facility];  Service: Cardiovascular;  Laterality: N/A;   CATARACT EXTRACTION, BILATERAL     CHOLECYSTECTOMY     '90-"sludge"   EYE SURGERY     JOINT REPLACEMENT     NASAL FRACTURE SURGERY  2006   RIGHT HEART CATH N/A 06/30/2017   Procedure: RIGHT HEART CATH;  Surgeon: Larey Dresser, MD;  Location: [place] [facility];  Service: Cardiovascular;  Laterality: N/A;   RIGHT HEART CATH N/A 08/09/2020   Procedure: RIGHT HEART CATH;  Surgeon: Larey Dresser, MD;  Location: [place] [facility];  Service: Cardiovascular;  Laterality: N/A;   TEE WITHOUT CARDIOVERSION N/A  01/30/2015   Procedure: TRANSESOPHAGEAL ECHOCARDIOGRAM (TEE);  Surgeon: Sanda Klein, MD;  Location: [name] ENDOSCOPY;  Service: Cardiovascular;  Laterality: N/A;   TEE WITHOUT CARDIOVERSION N/A 01/25/2017   Procedure: TRANSESOPHAGEAL ECHOCARDIOGRAM (TEE);  Surgeon: Fay Records, MD;  Location: [facility] ENDOSCOPY;  Service: Cardiovascular;  Laterality: N/A;   TOTAL HIP ARTHROPLASTY  04/26/2012   Procedure: TOTAL HIP ARTHROPLASTY ANTERIOR APPROACH;  Surgeon: Mauri Pole, MD;  Location: [facility];  Service: Orthopedics;  Laterality: Right;   TUBAL LIGATION  1980     reports that she has never smoked. She has never used smokeless tobacco. She reports current alcohol use. She reports that she does not use drugs.  Allergies  Allergen Reactions   Levothyroxine Shortness Of Breath and Other (See Comments)    Exhaustion also - Per pt her this medication could have not been the reason for her symptoms   Statins Other (See Comments)    "Pain, weakness and kidney problems"   Prednisone Other (See Comments)    Severe insomnia   Penicillins Other (See Comments)    dry mouth Has patient had a PCN reaction causing immediate rash, facial/tongue/throat swelling, SOB or lightheadedness with hypotension: No Has patient had a PCN reaction causing severe rash involving mucus membranes or skin necrosis: No Has patient had a PCN reaction that required hospitalization No Has patient had a PCN reaction occurring within the last 10 years: No If all of the above answers are "NO", then may proceed with Cephalosporin use.     Family History  Adopted: Yes  Problem Relation Age of Onset   Other Other        patient was adopted     Prior to Admission medications   Medication Sig Start Date End Date Taking? Authorizing Provider  acetaminophen (TYLENOL) 325 MG tablet Take 2 tablets (650 mg total) by mouth every 4 (four) hours as needed for headache or mild pain. 01/29/15  Yes Kilroy, Luke K, PA-C  ELIQUIS 5 MG TABS  tablet TAKE 1 TABLET BY MOUTH TWICE A DAY Patient taking differently: Take 5 mg by mouth 2 (two) times daily. 08/20/20  Yes Troy Sine, MD  famotidine (PEPCID) 20 MG tablet TAKE 1 TABLET BY MOUTH TWICE A DAY Patient taking differently: Take 20 mg by mouth 2 (two) times daily. 06/26/20  Yes Larey Dresser, MD  flecainide (TAMBOCOR) 50 MG tablet Take 1 tablet (50 mg total) by mouth 2 (two) times daily. 03/03/21  Yes Fenton, Clint R, PA  macitentan (OPSUMIT) 10 MG tablet Take 1 tablet (10 mg total) by mouth daily. 08/29/20  Yes Larey Dresser, MD  midodrine (PROAMATINE) 5 MG tablet Take 5 mg by mouth QID. Takes 4x/day, 3 hours apart.   Yes [provider]  Probiotic Product (PROBIOTIC-10) CAPS Take 1 capsule by mouth every Monday, Wednesday,  and Friday.   Yes [provider]  Riociguat (ADEMPAS) 2.5 MG TABS Take 2.5 mg by mouth 2 (two) times daily.   Yes [provider]  Selexipag (UPTRAVI) 1200 MCG TABS Take 2 tablets by mouth in the morning and at bedtime. Patient taking differently: Take 2,400 mcg by mouth in the morning and at bedtime. 12/27/20  Yes Larey Dresser, MD  fluticasone [facility]) 50 MCG/ACT nasal spray Place 1 spray daily as needed into both nostrils for allergies or rhinitis.    [provider]    Physical Exam: Vitals:   03/20/2021 2100 04/01/2021 2115 03/05/2021 2130 03/22/2021 2145  BP: (!) 95/58 (!) 89/51 (!) 93/51 (!) 88/49  Pulse: 66 68 66 69  Resp: '19 19 18 19  '$ Temp:      TempSrc:      SpO2: 91% 92% 90% 94%  Weight:      Height:        Constitutional: NAD, calm, comfortable Eyes: PERRL, lids and conjunctivae normal ENMT: Mucous membranes are moist. Posterior pharynx clear of any exudate or lesions.Normal dentition.  Neck: normal, supple, no masses, no thyromegaly Respiratory: clear to auscultation bilaterally, no wheezing, no crackles. Normal respiratory effort. No accessory muscle use.  Cardiovascular: Regular rate and rhythm, no  murmurs / rubs / gallops. No extremity edema. 2+ pedal pulses. No carotid bruits.  Abdomen: no tenderness, no masses palpated. No hepatosplenomegaly. Bowel sounds positive.  Musculoskeletal: no clubbing / cyanosis. No joint deformity upper and lower extremities. Good ROM, no contractures. Normal muscle tone.  Skin: no rashes, lesions, ulcers. No induration Neurologic: CN 2-12 grossly intact. Sensation intact, DTR normal. Strength 5/5 in all 4.  Psychiatric: Normal judgment and insight. Alert and oriented x 3. Normal mood.    Labs on Admission: I have personally reviewed following labs and imaging studies  CBC: Recent Labs  Lab 03/12/2021 1602  WBC 4.5  NEUTROABS 3.9  HGB 8.3*  HCT 26.7*  MCV 92.7  PLT [id number]   Basic Metabolic Panel: Recent Labs  Lab 03/07/2021 1602  NA 133*  K 3.1*  CL 92*  CO2 27  GLUCOSE 95  BUN 45*  CREATININE 5.74*  CALCIUM 9.1  MG 2.0   GFR: Estimated Creatinine Clearance: 6.3 mL/min (A) (by C-G formula based on SCr of 5.74 mg/dL (H)). Liver Function Tests: No results for input(s): AST, ALT, ALKPHOS, BILITOT, PROT, ALBUMIN in the last 168 hours. No results for input(s): LIPASE, AMYLASE in the last 168 hours. No results for input(s): AMMONIA in the last 168 hours. Coagulation Profile: No results for input(s): INR, PROTIME in the last 168 hours. Cardiac Enzymes: No results for input(s): CKTOTAL, CKMB, CKMBINDEX, TROPONINI in the last 168 hours. BNP (last 3 results) No results for input(s): PROBNP in the last 8760 hours. HbA1C: No results for input(s): HGBA1C in the last 72 hours. CBG: No results for input(s): GLUCAP in the last 168 hours. Lipid Profile: No results for input(s): CHOL, HDL, LDLCALC, TRIG, CHOLHDL, LDLDIRECT in the last 72 hours. Thyroid Function Tests: No results for input(s): TSH, T4TOTAL, FREET4, T3FREE, THYROIDAB in the last 72 hours. Anemia Panel: No results for input(s): VITAMINB12, FOLATE, FERRITIN, TIBC, IRON, RETICCTPCT in  the last 72 hours. Urine analysis:    Component Value Date/Time   COLORURINE YELLOW 08/08/2015 1855   APPEARANCEUR CLEAR 08/08/2015 1855   LABSPEC 1.011 08/08/2015 1855   PHURINE 6.0 08/08/2015 1855   GLUCOSEU NEGATIVE 08/08/2015 1855   GLUCOSEU NEGATIVE 02/13/2015 1112  HGBUR NEGATIVE 08/08/2015 1855   BILIRUBINUR NEGATIVE 08/08/2015 1855   KETONESUR 15 (A) 08/08/2015 1855   PROTEINUR NEGATIVE 08/08/2015 1855   UROBILINOGEN 0.2 04/14/2015 1645   NITRITE NEGATIVE 08/08/2015 1855   LEUKOCYTESUR TRACE (A) 08/08/2015 1855    Radiological Exams on Admission: CT Angio Chest PE W and/or Wo Contrast  Result Date: 03/05/2021 CLINICAL DATA:  Right-sided neck pain for 8 weeks EXAM: CT ANGIOGRAPHY CHEST WITH CONTRAST TECHNIQUE: Multidetector CT imaging of the chest was performed using the standard protocol during bolus administration of intravenous contrast. Multiplanar CT image reconstructions and MIPs were obtained to evaluate the vascular anatomy. CONTRAST:  28m OMNIPAQUE IOHEXOL 350 MG/ML SOLN COMPARISON:  Same day chest radiograph, CT chest 06/21/2017 FINDINGS: Cardiovascular: Satisfactory opacification of the pulmonary arteries to the segmental level. No evidence of pulmonary embolism. There is cardiomegaly with particular enlargement of the right heart. The main pulmonary artery is enlarged at 3.4 cm. There is reflux of contrast into the IVC and hepatic veins. A right internal jugular dialysis catheter is in place terminating in the right atrium. There is calcified atherosclerotic plaque of the aortic arch. Mediastinum/Nodes: There are prominent mediastinal lymph nodes measuring up to 1.4 cm. There is right hilar lymphadenopathy with a conglomerate measuring up to approximately 2.2 cm x 1.7 cm (4-100). There are enlarged left hilar lymph nodes measuring up to 1.2 cm. These lymph nodes are overall increased in size compared to the CT chest of 06/21/2017. Lungs/Pleura: The trachea and central  airways are patent. There is a background of moderate emphysema. There is masslike consolidation in the superior segment of the right lower lobe measuring 2.3 cm by 1.4 cm. There is a calcified granuloma in the right middle lobe. There is a trace right pleural effusion with adjacent relaxation atelectasis. Otherwise, there is no focal consolidation. There is no left pleural effusion. There is no pneumothorax. Upper Abdomen: The imaged portions of the upper abdominal viscera are unremarkable. Musculoskeletal: There is degenerative change in the lower cervical spine. There is no acute osseous abnormality. A fatty lesion in the right breast is unchanged, consistent with a lipoma. Review of the MIP images confirms the above findings. IMPRESSION: 1. No evidence of acute pulmonary embolus. 2. Cardiomegaly with evidence of right heart failure as above. Enlargement of the pulmonary artery also suggests pulmonary hypertension. 3. Bulky mediastinal and hilar lymphadenopathy and a 2.3 cm x 1.4 cm may be due to heart failure. Recommend short follow up chest CT in 3 months to reassess. Masslike consolidation in the right lower lobe can also be reassessed at this time. 4. Trace right pleural effusion. 5. Aortic Atherosclerosis (ICD10-I70.0) and Emphysema (ICD10-J43.9). Electronically Signed   By: PValetta MoleM.D.   On: 03/05/2021 18:05   DG Chest Port 1 View  Result Date: 03/03/2021 CLINICAL DATA:  Shortness of breath. EXAM: PORTABLE CHEST 1 VIEW COMPARISON:  09/01/2018 FINDINGS: A new right jugular catheter has been placed and terminates over the lower SVC. The cardiac silhouette is mildly enlarged. Aortic atherosclerosis is noted. There is central pulmonary vascular congestion. Widespread bilateral interstitial densities have mildly increased. Trace pleural effusions are questioned. No pneumothorax or acute osseous abnormality is identified. IMPRESSION: Mildly increased interstitial densities which may reflect mild edema.  Electronically Signed   By: ALogan BoresM.D.   On: 03/11/2021 16:47    EKG: Independently reviewed.  Assessment/Plan Principal Problem:   Volume overload Active Problems:   Atrial fibrillation status post cardioversion, 01/30/15 maintaining SR.  End stage renal disease ([place])   Primary pulmonary hypertension (HCC)   Mediastinal lymphadenopathy    Volume overload - with increased O2 requirement Missed dialysis today, pt with increased sensitivity to volume overload due to very severe PAH. Normally dialyzed 4 times a week Cont pulse ox Tele monitor No respiratory distress at the moment, sats are okay on 5L, may go up as needed Spoke with Dr. Joylene Grapes: current plan is for dialysis in AM Bed rest until then Hypotension - Mental status normal currently Per PT: takes midodrine QID but only during day, not at night. Give midodrine before dialysis in AM PAH - Very severe with R sided CHF Continue her 3 PAH meds PAF - Maintaining SR Cont flecanide Cont eliquis (dose reduced by pharm to 2.5 BID) Lymphadenopathy in chest - Needs repeat CT in 3 months. Mass like consolidation in RLL  DVT prophylaxis: Eliquis Code Status: Full Family Communication: No family in room Disposition Plan: Home after cleared by nephro Consults called: Dr. Joylene Grapes Admission status: Place in [place], [place] Hospitalists  How to contact the [facility] Attending or Consulting provider [place] or covering provider during after hours [place], for this patient?  Check the care team in [facility] and look for a) attending/consulting [facility] provider listed and b) the [facility] team listed Log into [email or web address]  Amion Physician Scheduling and messaging for groups and whole hospitals  On call and physician scheduling software for group practices, residents, hospitalists and other medical providers for call, clinic, rotation and shift schedules. On[employer] is a hospital-wide system for scheduling doctors and  paging doctors on call. EasyPlot is for scientific plotting and data analysis.  [email or web address]  and use [place]'s universal password to access. If you do not have the password, please contact the hospital operator.  Locate the [facility] provider you are looking for under [facility] and page to a number that you can be directly reached. If you still have difficulty reaching the provider, please page the [facility] (Director on Call) for the Hospitalists listed on amion for assistance.  03/25/2021, 11:13 PM

## 2021-03-24 NOTE — ED Provider Notes (Signed)
[facility] EMERGENCY DEPT Provider Note   CSN: [id number] Arrival date & time: 03/27/2021  1425     History Chief Complaint  Patient presents with   Neck Pain    Tiffany [name] is a 75 y.o. female.   Neck Pain Associated symptoms: no chest pain, no fever, no headaches, no numbness and no weakness   Patient presents for neck pain.  Onset was 8 weeks ago.  Around that time, she started hemodialysis.  She attributes neck pain to her posture while getting HD.  She has been treating her pain with Tylenol with minimal relief.  She typically gets HD on M, T, [place], F.  Her last session was on Friday.  She missed a session today due to the neck pain.  Additionally, she has had increasing generalized weakness, unsteadiness on her feet, and fatigue.  She has PIH and takes medications for this.  She is on 3 L of oxygen at baseline.  She states that her blood pressure typically runs low and she takes midodrine to help with that.  She gets her dialysis from a right upper chest PermCath.  She has undergone fistula placement on her left arm but has not started using this yet.  She is on Eliquis.  She states that she still makes a tiny amount of urine.    Past Medical History:  Diagnosis Date   Anemia    Arthritis 04/20/2012   Osteoarthritis-right hip   Atrial fibrillation status post cardioversion, 01/30/15 maintaining SR.  01/31/2015   a. 01/2015 s/p TEE/DCCV;  b. 02/06/2015 recurrent AF noted, amio added;  c. CHA2DS2VASc = 3-->eliquis.   BENIGN POSITIONAL VERTIGO    Cataract    CHF (congestive heart failure) (HCC)    Chronic kidney disease    Complication of anesthesia 04/20/2012   some issues with prolonged sedation after anesthesia   Diverticulosis    DYSLIPIDEMIA    Dyspnea    Dysrhythmia    Esophageal stricture    GERD    HYPERTENSION    a. severe, pt intol of med tx, Pt. has severe "whitecoat" syndrome and refused medical therapy.   Hypothyroidism 09/29/2014   a. pt did not  tolerate synthroid and this was subsequently discontinued.   Mild LV dysfunction    a. 01/2015 Echo: EF 45-50%, mild MR, mildly dil LA, mod dil RV with mod to sev reduced fxn, mod-sev TR, PASP 29mHg.   Osteopenia    Pneumonia    Pulmonary hypertension (HCC)    Raynaud disease    CREST   Sleep apnea    Umbilical hernia    URINARY INCONTINENCE 04/20/2012   occ. with nighttime sleep pattern    Patient Active Problem List   Diagnosis Date Noted   Volume overload 03/06/2021   Mediastinal lymphadenopathy 03/16/2021   Allergy, unspecified, initial encounter 01/21/2021   Anaphylactic shock, unspecified, initial encounter 01/21/2021   Anemia in chronic kidney disease 01/21/2021   Benign paroxysmal vertigo, bilateral [id number]  Complication of vascular dialysis catheter 01/21/2021   [place] stage renal disease ([place] 01/21/2021   Iron deficiency anemia, unspecified 01/21/2021   Obstructive sleep apnea 01/21/2021   Primary pulmonary hypertension ([place] 01/21/2021   Secondary hyperparathyroidism of renal origin ([place] 01/21/2021   Status post tubal ligation [id number]  Umbilical hernia without obstruction or gangrene 01/21/2021   CKD (chronic kidney disease) stage 5, GFR less than 15 ml/min (HCC) 12/10/2020   Chronic kidney disease (CKD), stage IV (severe) ([facility] 10/15/2020  Thyrotoxicosis without thyroid storm 12/14/2019   Thyroid nodule 12/14/2019   Secondary hypercoagulable state ([place]) 11/27/2019   Dry mouth 05/03/2018   Presbycusis of both ears 05/03/2018   Mild LV dysfunction    Esophageal stricture    Trigger finger of right thumb 03/09/2017   Persistent atrial fibrillation ([place]) 01/26/2017   Routine general medical examination at a health care facility 10/03/2015   Chronic diastolic CHF (congestive heart failure) ([place]) 09/19/2015   Pulmonary hypertension ([place]) 09/19/2015   Paroxysmal atrial fibrillation (HCC)    Bradycardia    SOB (shortness of breath) 03/26/2015   Atrial  fibrillation status post cardioversion, 01/30/15 maintaining SR.  01/31/2015   CREST (calcinosis, Raynaud's phenomenon, esophageal dysfunction, sclerodactyly, telangiectasia) ([place]) 01/29/2015   CKD (chronic kidney disease) stage 4, GFR 15-29 ml/min ([place]) 01/29/2015   Hypothyroidism 09/29/2014   Osteopenia    Arthritis 04/20/2012   Anxiety    Osteoarthritis of right hip 01/13/2010   Obesity-BMI 31 07/01/2009   GERD 07/01/2009   URINARY INCONTINENCE 07/01/2009    Past Surgical History:  Procedure Laterality Date   ATRIAL FIBRILLATION ABLATION N/A 01/26/2017   Procedure: Atrial Fibrillation Ablation;  Surgeon: Thompson Grayer, MD;  Location: [facility];  Service: Cardiovascular;  Laterality: N/A;   BASCILIC VEIN TRANSPOSITION Left 12/23/2020   Procedure: LEFT FIRST STAGE [place];  Surgeon: [name], MD;  Location: [facility];  Service: Vascular;  Laterality: Left;   [place] Left 02/14/2021   Procedure: LEFT ARM SECOND STAGE BASILIC VEIN TRANSPOSITION;  Surgeon: [name], MD;  Location: [facility];  Service: Vascular;  Laterality: Left;   breast ultrasound Right 09/13/2013   There is no sonographic evidence of malignancy. the [id number] complicated cyst in (R) breast is consistent with a benign finding. repeat in 1 year   CARDIAC CATHETERIZATION N/A 08/29/2015   Procedure: Right Heart Cath;  Surgeon: Larey Dresser, MD;  Location: [facility];  Service: Cardiovascular;  Laterality: N/A;   CARDIOVERSION N/A 01/30/2015   Procedure: CARDIOVERSION;  Surgeon: Sanda Klein, MD;  Location: [facility];  Service: Cardiovascular;  Laterality: N/A;   CATARACT EXTRACTION, BILATERAL     CHOLECYSTECTOMY     '90-"sludge"   EYE SURGERY     JOINT REPLACEMENT     NASAL FRACTURE SURGERY  2006   RIGHT HEART CATH N/A 06/30/2017   Procedure: RIGHT HEART CATH;  Surgeon: Larey Dresser, MD;  Location: [facility];  Service:  Cardiovascular;  Laterality: N/A;   RIGHT HEART CATH N/A 08/09/2020   Procedure: RIGHT HEART CATH;  Surgeon: Larey Dresser, MD;  Location: [facility];  Service: Cardiovascular;  Laterality: N/A;   TEE WITHOUT CARDIOVERSION N/A 01/30/2015   Procedure: TRANSESOPHAGEAL ECHOCARDIOGRAM (TEE);  Surgeon: Sanda Klein, MD;  Location: [facility];  Service: Cardiovascular;  Laterality: N/A;   TEE WITHOUT CARDIOVERSION N/A 01/25/2017   Procedure: TRANSESOPHAGEAL ECHOCARDIOGRAM (TEE);  Surgeon: Fay Records, MD;  Location: [facility];  Service: Cardiovascular;  Laterality: N/A;   TOTAL HIP ARTHROPLASTY  04/26/2012   Procedure: TOTAL HIP ARTHROPLASTY ANTERIOR APPROACH;  Surgeon: Mauri Pole, MD;  Location: [facility];  Service: Orthopedics;  Laterality: Right;   TUBAL LIGATION  1980     OB History   No obstetric history on file.     Family History  Adopted: Yes  Problem Relation Age of Onset   Other Other        patient was adopted  Social History   Tobacco Use   Smoking status: Never   Smokeless tobacco: Never  Vaping Use   Vaping Use: Never used  Substance Use Topics   Alcohol use: Yes    Comment: less than 1 drink per week   Drug use: No    Home Medications Prior to Admission medications   Medication Sig Start Date End Date Taking? Authorizing Provider  acetaminophen (TYLENOL) 325 MG tablet Take 2 tablets (650 mg total) by mouth every 4 (four) hours as needed for headache or mild pain. 01/29/15  Yes Kilroy, Luke K, PA-C  ELIQUIS 5 MG TABS tablet TAKE 1 TABLET BY MOUTH TWICE A DAY Patient taking differently: Take 5 mg by mouth 2 (two) times daily. 08/20/20  Yes Troy Sine, MD  famotidine (PEPCID) 20 MG tablet TAKE 1 TABLET BY MOUTH TWICE A DAY Patient taking differently: Take 20 mg by mouth 2 (two) times daily. 06/26/20  Yes Larey Dresser, MD  flecainide (TAMBOCOR) 50 MG tablet Take 1 tablet (50 mg total) by mouth 2 (two) times daily. 03/03/21  Yes Fenton,  Clint R, PA  macitentan (OPSUMIT) 10 MG tablet Take 1 tablet (10 mg total) by mouth daily. 08/29/20  Yes Larey Dresser, MD  midodrine (PROAMATINE) 5 MG tablet Take 5 mg by mouth QID. Takes 4x/day, 3 hours apart.   Yes [provider]  Probiotic Product (PROBIOTIC-10) CAPS Take 1 capsule by mouth every Monday, Wednesday, and Friday.   Yes [provider]  Riociguat (ADEMPAS) 2.5 MG TABS Take 2.5 mg by mouth 2 (two) times daily.   Yes [provider]  Selexipag (UPTRAVI) 1200 MCG TABS Take 2 tablets by mouth in the morning and at bedtime. Patient taking differently: Take 2,400 mcg by mouth in the morning and at bedtime. 12/27/20  Yes Larey Dresser, MD  fluticasone [facility]) 50 MCG/ACT nasal spray Place 1 spray daily as needed into both nostrils for allergies or rhinitis.    [provider]    Allergies    Levothyroxine, Statins, Prednisone, and Penicillins  Review of Systems   Review of Systems  Constitutional:  Positive for activity change, appetite change and fatigue. Negative for chills and fever.  HENT:  Negative for congestion, ear pain, facial swelling, sore throat and trouble swallowing.   Eyes:  Negative for pain and visual disturbance.  Respiratory:  Positive for shortness of breath. Negative for cough, chest tightness and wheezing.   Cardiovascular:  Negative for chest pain and palpitations.  Gastrointestinal:  Negative for abdominal pain, diarrhea, nausea and vomiting.  Genitourinary:  Negative for flank pain and pelvic pain.       Nearly anuric at baseline  Musculoskeletal:  Positive for neck pain. Negative for arthralgias, back pain, joint swelling and myalgias.  Skin:  Negative for color change and rash.  Neurological:  Negative for dizziness, seizures, syncope, speech difficulty, weakness, light-headedness, numbness and headaches.  Hematological:  Bruises/bleeds easily (On Eliquis).  Psychiatric/Behavioral:  Negative for confusion and  decreased concentration.   All other systems reviewed and are negative.  Physical Exam Updated Vital Signs BP (!) 90/43   Pulse (!) 101   Temp 98.3 F (36.8 C) (Oral)   Resp 17   Ht '4\' 11"'$  (1.499 m)   Wt 56.5 kg   SpO2 90%   BMI 25.16 kg/m   Physical Exam Vitals and nursing note reviewed.  Constitutional:      General: She is not in acute distress.  Appearance: She is well-developed and normal weight. She is ill-appearing. She is not toxic-appearing or diaphoretic.  HENT:     Head: Normocephalic and atraumatic.     Right Ear: External ear normal.     Left Ear: External ear normal.     Nose: Nose normal.     Mouth/Throat:     Mouth: Mucous membranes are moist.     Pharynx: Oropharynx is clear.  Eyes:     Conjunctiva/sclera: Conjunctivae normal.  Neck:     Comments: Right-sided neck pain.  Worsened with rightward rotation and palpation. Cardiovascular:     Rate and Rhythm: Normal rate.     Heart sounds: No murmur heard. Pulmonary:     Effort: No respiratory distress.     Breath sounds: Rales present. No wheezing.     Comments: Tachypnea; increased work of breathing; hypoxia Abdominal:     General: There is no distension.     Palpations: Abdomen is soft.     Tenderness: There is no abdominal tenderness. There is no guarding.  Musculoskeletal:        General: No tenderness.     Cervical back: Neck supple.     Right lower leg: Edema present.     Left lower leg: Edema present.  Skin:    General: Skin is warm and dry.     Coloration: Skin is not jaundiced or pale.  Neurological:     General: No focal deficit present.     Mental Status: She is alert and oriented to person, place, and time.     Cranial Nerves: No cranial nerve deficit.     Sensory: No sensory deficit.     Motor: No weakness.  Psychiatric:        Mood and Affect: Mood normal.        Behavior: Behavior normal.    ED Results / Procedures / Treatments   Labs (all labs ordered are listed, but only  abnormal results are displayed) Labs Reviewed  BASIC METABOLIC PANEL - Abnormal; Notable for the following components:      Result Value   Sodium 133 (*)    Potassium 3.1 (*)    Chloride 92 (*)    BUN 45 (*)    Creatinine, Ser 5.74 (*)    GFR, Estimated 7 (*)    All other components within normal limits  CBC WITH DIFFERENTIAL/PLATELET - Abnormal; Notable for the following components:   RBC 2.88 (*)    Hemoglobin 8.3 (*)    HCT 26.7 (*)    RDW 21.3 (*)    Lymphs Abs 0.4 (*)    All other components within normal limits  D-DIMER, QUANTITATIVE - Abnormal; Notable for the following components:   D-Dimer, Quant 0.93 (*)    All other components within normal limits  BRAIN NATRIURETIC PEPTIDE - Abnormal; Notable for the following components:   B Natriuretic Peptide 2,088.5 (*)    All other components within normal limits  CBC - Abnormal; Notable for the following components:   RBC 2.69 (*)    Hemoglobin 7.8 (*)    HCT 24.9 (*)    RDW 21.4 (*)    All other components within normal limits  BASIC METABOLIC PANEL - Abnormal; Notable for the following components:   Sodium 133 (*)    Chloride 94 (*)    Glucose, Bld 101 (*)    BUN 45 (*)    Creatinine, Ser 6.27 (*)    GFR, Estimated 6 (*)  All other components within normal limits  RESP PANEL BY RT-PCR (FLU A&B, COVID) ARPGX2  MAGNESIUM  HEPATITIS B SURFACE ANTIGEN  HEPATITIS B CORE ANTIBODY, TOTAL  HEPATITIS B SURFACE ANTIBODY, QUANTITATIVE  HEPATIC FUNCTION PANEL  PHOSPHORUS  TROPONIN I (HIGH SENSITIVITY)  TROPONIN I (HIGH SENSITIVITY)    EKG None  Radiology CT Angio Chest PE W and/or Wo Contrast  Result Date: 04/02/2021 CLINICAL DATA:  Right-sided neck pain for 8 weeks EXAM: CT ANGIOGRAPHY CHEST WITH CONTRAST TECHNIQUE: Multidetector CT imaging of the chest was performed using the standard protocol during bolus administration of intravenous contrast. Multiplanar CT image reconstructions and MIPs were obtained to evaluate  the vascular anatomy. CONTRAST:  56m OMNIPAQUE IOHEXOL 350 MG/ML SOLN COMPARISON:  Same day chest radiograph, CT chest 06/21/2017 FINDINGS: Cardiovascular: Satisfactory opacification of the pulmonary arteries to the segmental level. No evidence of pulmonary embolism. There is cardiomegaly with particular enlargement of the right heart. The main pulmonary artery is enlarged at 3.4 cm. There is reflux of contrast into the IVC and hepatic veins. A right internal jugular dialysis catheter is in place terminating in the right atrium. There is calcified atherosclerotic plaque of the aortic arch. Mediastinum/Nodes: There are prominent mediastinal lymph nodes measuring up to 1.4 cm. There is right hilar lymphadenopathy with a conglomerate measuring up to approximately 2.2 cm x 1.7 cm (4-100). There are enlarged left hilar lymph nodes measuring up to 1.2 cm. These lymph nodes are overall increased in size compared to the CT chest of 06/21/2017. Lungs/Pleura: The trachea and central airways are patent. There is a background of moderate emphysema. There is masslike consolidation in the superior segment of the right lower lobe measuring 2.3 cm by 1.4 cm. There is a calcified granuloma in the right middle lobe. There is a trace right pleural effusion with adjacent relaxation atelectasis. Otherwise, there is no focal consolidation. There is no left pleural effusion. There is no pneumothorax. Upper Abdomen: The imaged portions of the upper abdominal viscera are unremarkable. Musculoskeletal: There is degenerative change in the lower cervical spine. There is no acute osseous abnormality. A fatty lesion in the right breast is unchanged, consistent with a lipoma. Review of the MIP images confirms the above findings. IMPRESSION: 1. No evidence of acute pulmonary embolus. 2. Cardiomegaly with evidence of right heart failure as above. Enlargement of the pulmonary artery also suggests pulmonary hypertension. 3. Bulky mediastinal and  hilar lymphadenopathy and a 2.3 cm x 1.4 cm may be due to heart failure. Recommend short follow up chest CT in 3 months to reassess. Masslike consolidation in the right lower lobe can also be reassessed at this time. 4. Trace right pleural effusion. 5. Aortic Atherosclerosis (ICD10-I70.0) and Emphysema (ICD10-J43.9). Electronically Signed   By: PValetta MoleM.D.   On: 03/28/2021 18:05   DG Chest Port 1 View  Result Date: 03/07/2021 CLINICAL DATA:  Shortness of breath. EXAM: PORTABLE CHEST 1 VIEW COMPARISON:  09/01/2018 FINDINGS: A new right jugular catheter has been placed and terminates over the lower SVC. The cardiac silhouette is mildly enlarged. Aortic atherosclerosis is noted. There is central pulmonary vascular congestion. Widespread bilateral interstitial densities have mildly increased. Trace pleural effusions are questioned. No pneumothorax or acute osseous abnormality is identified. IMPRESSION: Mildly increased interstitial densities which may reflect mild edema. Electronically Signed   By: ALogan BoresM.D.   On: 03/31/2021 16:47    Procedures Procedures   Medications Ordered in ED Medications  apixaban (ELIQUIS) tablet 2.5 mg (2.5 mg  Oral Given 03/25/21 0142)  flecainide (TAMBOCOR) tablet 50 mg (50 mg Oral Given 03/25/21 0142)  midodrine (PROAMATINE) tablet 5 mg (5 mg Oral Given 03/25/21 1053)  Riociguat TABS 2.5 mg (2.5 mg Oral Not Given 03/25/21 0059)  Selexipag TABS 2,400 mcg (2,400 mcg Oral Not Given 03/25/21 0059)  saccharomyces boulardii (FLORASTOR) capsule 250 mg (has no administration in time range)  macitentan (OPSUMIT) tablet 10 mg (has no administration in time range)  famotidine (PEPCID) tablet 20 mg (20 mg Oral Given 03/25/21 0142)  fluticasone (FLONASE) 50 MCG/ACT nasal spray 1 spray (has no administration in time range)  acetaminophen (TYLENOL) tablet 650 mg (650 mg Oral Given 03/25/21 0142)  ondansetron (ZOFRAN) tablet 4 mg (has no administration in time range)    Or   ondansetron (ZOFRAN) injection 4 mg (has no administration in time range)  Chlorhexidine Gluconate Cloth 2 % PADS 6 each (has no administration in time range)  heparin sodium (porcine) 1000 UNIT/ML injection (has no administration in time range)  metoprolol succinate (TOPROL-XL) 24 hr tablet 12.5 mg (has no administration in time range)  Darbepoetin Alfa (ARANESP) injection 60 mcg (60 mcg Intravenous Given 03/25/21 1054)  ferric gluconate (FERRLECIT) 62.5 mg in sodium chloride 0.9 % 100 mL IVPB (has no administration in time range)  fentaNYL (SUBLIMAZE) injection 25 mcg (25 mcg Intravenous Given 03/27/2021 1625)  iohexol (OMNIPAQUE) 350 MG/ML injection 50 mL (50 mLs Intravenous Contrast Given 03/04/2021 1706)    ED Course  I have reviewed the triage vital signs and the nursing notes.  Pertinent labs & imaging results that were available during my care of the patient were reviewed by me and considered in my medical decision making (see chart for details).    MDM Rules/Calculators/A&P                         CRITICAL CARE Performed by: Godfrey Pick   Total critical care time: 45 minutes  Critical care time was exclusive of separately billable procedures and treating other patients.  Critical care was necessary to treat or prevent imminent or life-threatening deterioration.  Critical care was time spent personally by me on the following activities: development of treatment plan with patient and/or surrogate as well as nursing, discussions with consultants, evaluation of patient's response to treatment, examination of patient, obtaining history from patient or surrogate, ordering and performing treatments and interventions, ordering and review of laboratory studies, ordering and review of radiographic studies, pulse oximetry and re-evaluation of patient's condition.   Patient is a 75 year old female history of severe pulmonary arterial hypertension and ESRD, who presents for ongoing right-sided  neck pain over the past several weeks.  She has been followed by cardiology for years.  She initiated ESRD 8 weeks ago.  She currently gets dialysis 4 days/week.  She missed her dialysis session today due to the discomfort in her neck.  She states that the discomfort is worsened by the positioning during dialysis sessions.  She has undergone a recent left upper extremity fistula placement.  Fistula is still in the healing stage and has not yet been utilized.  She gets her dialysis from a right upper chest PermCath.  Although patient presented for neck pain, found to be tachypneic with increased work of breathing and hypoxic upon arrival.  She is on 3 L of supplemental oxygen at baseline.  On arrival, SPO2 in the high 80s on her home dose of oxygen.  Patient's lips also appeared slightly purple  causing concern for central cyanosis.  Patient's husband states that that is normal for her.  Vital signs also notable for hypotension.  Patient states that this is also normal for her.  She does take midodrine to help with her chronic hypotension.  She is also on medications for management of her PAH.  Given her worsening fatigue that she describes as worsening over the past 8 weeks, in addition to her increased work of breathing and hypoxia and neck pain (in the vicinity of her PermCath), there is concern of thromboses.  Patient does take Eliquis at baseline.  Given her severe PAH, the additional burden of pulmonary or central thromboses could be devastating.  I had a shared discussion making decision with the patient regarding CT scanning of her chest.  Patient is now on permanent dialysis.  She states that she is nearly anuric.  I explained to her that use of IV contrast can further damage her kidneys.  Patient stated that she has so little function of her kidneys left that she would want to get a contrasted scan if it might help identify the cause of her symptoms.  CTA of chest, in addition to laboratory diagnostic  work-up, was ordered.  EKG showed some inferior ST segment abnormalities that appear to be chronic based on prior EKGs.  Troponins were ordered, the first of which was normal.  Patient is found to have a hemoglobin of 8.3, a 2 g drop from CBC a month ago.  Anemia possibly contributing to her shortness of breath.  Patient's BNP was significantly elevated, although no comparison BNP's available in the past year.  CT scan of chest showed no PE.  Did show cardiomegaly, PAH, lymphadenopathy, and a RLL masslike consolidation.  Patient was given fentanyl for her neck pain and on reassessment noted improved neck pain.  She was now endorsing some back pain that she attributed to positioning in the ED stretcher.  Supplemental oxygen was increased to maintain SPO2 was in the 90s.  Patient continued to have increased work of breathing.  Given her missed dialysis session today, increased work of breathing could be due to a small amount of fluid overload.  I feel like her presentation is concerning for right heart failure exacerbation.  Given her complex chronic conditions, I do believe that she does require hospital admission.  Patient was agreeable to this, although tearful.  Patient was admitted and transferred to [facility] in stable condition.  final Clinical Impression(s) / ED Diagnoses Final diagnoses:  Acute respiratory failure with hypoxia [facility])    Rx / DC Orders ED Discharge Orders     None        Godfrey Pick, MD 03/25/21 1151

## 2021-03-25 ENCOUNTER — Inpatient Hospital Stay (HOSPITAL_COMMUNITY): Payer: Medicare PPO

## 2021-03-25 ENCOUNTER — Observation Stay (HOSPITAL_COMMUNITY): Payer: Medicare PPO

## 2021-03-25 DIAGNOSIS — M8580 Other specified disorders of bone density and structure, unspecified site: Secondary | ICD-10-CM | POA: Diagnosis present

## 2021-03-25 DIAGNOSIS — E8779 Other fluid overload: Secondary | ICD-10-CM | POA: Diagnosis not present

## 2021-03-25 DIAGNOSIS — I4891 Unspecified atrial fibrillation: Secondary | ICD-10-CM | POA: Diagnosis not present

## 2021-03-25 DIAGNOSIS — I132 Hypertensive heart and chronic kidney disease with heart failure and with stage 5 chronic kidney disease, or end stage renal disease: Secondary | ICD-10-CM | POA: Diagnosis present

## 2021-03-25 DIAGNOSIS — R590 Localized enlarged lymph nodes: Secondary | ICD-10-CM | POA: Diagnosis not present

## 2021-03-25 DIAGNOSIS — J9600 Acute respiratory failure, unspecified whether with hypoxia or hypercapnia: Secondary | ICD-10-CM | POA: Diagnosis present

## 2021-03-25 DIAGNOSIS — J9601 Acute respiratory failure with hypoxia: Secondary | ICD-10-CM | POA: Diagnosis not present

## 2021-03-25 DIAGNOSIS — I071 Rheumatic tricuspid insufficiency: Secondary | ICD-10-CM | POA: Diagnosis present

## 2021-03-25 DIAGNOSIS — M542 Cervicalgia: Secondary | ICD-10-CM | POA: Diagnosis present

## 2021-03-25 DIAGNOSIS — Z20822 Contact with and (suspected) exposure to covid-19: Secondary | ICD-10-CM | POA: Diagnosis present

## 2021-03-25 DIAGNOSIS — N186 End stage renal disease: Secondary | ICD-10-CM | POA: Diagnosis present

## 2021-03-25 DIAGNOSIS — R54 Age-related physical debility: Secondary | ICD-10-CM | POA: Diagnosis present

## 2021-03-25 DIAGNOSIS — E039 Hypothyroidism, unspecified: Secondary | ICD-10-CM | POA: Diagnosis present

## 2021-03-25 DIAGNOSIS — I270 Primary pulmonary hypertension: Secondary | ICD-10-CM | POA: Diagnosis not present

## 2021-03-25 DIAGNOSIS — E785 Hyperlipidemia, unspecified: Secondary | ICD-10-CM | POA: Diagnosis present

## 2021-03-25 DIAGNOSIS — I480 Paroxysmal atrial fibrillation: Secondary | ICD-10-CM

## 2021-03-25 DIAGNOSIS — E871 Hypo-osmolality and hyponatremia: Secondary | ICD-10-CM | POA: Diagnosis present

## 2021-03-25 DIAGNOSIS — K219 Gastro-esophageal reflux disease without esophagitis: Secondary | ICD-10-CM | POA: Diagnosis present

## 2021-03-25 DIAGNOSIS — I2721 Secondary pulmonary arterial hypertension: Secondary | ICD-10-CM | POA: Diagnosis present

## 2021-03-25 DIAGNOSIS — I50810 Right heart failure, unspecified: Secondary | ICD-10-CM | POA: Diagnosis not present

## 2021-03-25 DIAGNOSIS — M341 CR(E)ST syndrome: Secondary | ICD-10-CM | POA: Diagnosis present

## 2021-03-25 DIAGNOSIS — D631 Anemia in chronic kidney disease: Secondary | ICD-10-CM | POA: Diagnosis present

## 2021-03-25 DIAGNOSIS — E873 Alkalosis: Secondary | ICD-10-CM | POA: Diagnosis not present

## 2021-03-25 DIAGNOSIS — I462 Cardiac arrest due to underlying cardiac condition: Secondary | ICD-10-CM | POA: Diagnosis not present

## 2021-03-25 DIAGNOSIS — J9621 Acute and chronic respiratory failure with hypoxia: Secondary | ICD-10-CM | POA: Diagnosis present

## 2021-03-25 DIAGNOSIS — I493 Ventricular premature depolarization: Secondary | ICD-10-CM | POA: Diagnosis present

## 2021-03-25 DIAGNOSIS — Z96641 Presence of right artificial hip joint: Secondary | ICD-10-CM | POA: Diagnosis present

## 2021-03-25 DIAGNOSIS — I5042 Chronic combined systolic (congestive) and diastolic (congestive) heart failure: Secondary | ICD-10-CM | POA: Diagnosis present

## 2021-03-25 DIAGNOSIS — E8770 Fluid overload, unspecified: Secondary | ICD-10-CM | POA: Diagnosis present

## 2021-03-25 DIAGNOSIS — Z992 Dependence on renal dialysis: Secondary | ICD-10-CM | POA: Diagnosis not present

## 2021-03-25 DIAGNOSIS — G4733 Obstructive sleep apnea (adult) (pediatric): Secondary | ICD-10-CM | POA: Diagnosis present

## 2021-03-25 DIAGNOSIS — I9589 Other hypotension: Secondary | ICD-10-CM | POA: Diagnosis present

## 2021-03-25 LAB — HEPATITIS B SURFACE ANTIGEN: Hepatitis B Surface Ag: NONREACTIVE

## 2021-03-25 LAB — ECHOCARDIOGRAM COMPLETE BUBBLE STUDY
AR max vel: 1.35 cm2
AV Area VTI: 1.53 cm2
AV Area mean vel: 1.42 cm2
AV Mean grad: 12 mmHg
AV Peak grad: 22.7 mmHg
Ao pk vel: 2.38 m/s
Area-P 1/2: 3.03 cm2
S' Lateral: 1.6 cm

## 2021-03-25 LAB — HEPATIC FUNCTION PANEL
ALT: 8 U/L (ref 0–44)
AST: 13 U/L — ABNORMAL LOW (ref 15–41)
Albumin: 2.8 g/dL — ABNORMAL LOW (ref 3.5–5.0)
Alkaline Phosphatase: 126 U/L (ref 38–126)
Bilirubin, Direct: 0.4 mg/dL — ABNORMAL HIGH (ref 0.0–0.2)
Indirect Bilirubin: 0.6 mg/dL (ref 0.3–0.9)
Total Bilirubin: 1 mg/dL (ref 0.3–1.2)
Total Protein: 6 g/dL — ABNORMAL LOW (ref 6.5–8.1)

## 2021-03-25 LAB — CBC
HCT: 24.9 % — ABNORMAL LOW (ref 36.0–46.0)
Hemoglobin: 7.8 g/dL — ABNORMAL LOW (ref 12.0–15.0)
MCH: 29 pg (ref 26.0–34.0)
MCHC: 31.3 g/dL (ref 30.0–36.0)
MCV: 92.6 fL (ref 80.0–100.0)
Platelets: 170 10*3/uL (ref 150–400)
RBC: 2.69 MIL/uL — ABNORMAL LOW (ref 3.87–5.11)
RDW: 21.4 % — ABNORMAL HIGH (ref 11.5–15.5)
WBC: 5.2 10*3/uL (ref 4.0–10.5)
nRBC: 0 % (ref 0.0–0.2)

## 2021-03-25 LAB — BASIC METABOLIC PANEL
Anion gap: 13 (ref 5–15)
BUN: 45 mg/dL — ABNORMAL HIGH (ref 8–23)
CO2: 26 mmol/L (ref 22–32)
Calcium: 9 mg/dL (ref 8.9–10.3)
Chloride: 94 mmol/L — ABNORMAL LOW (ref 98–111)
Creatinine, Ser: 6.27 mg/dL — ABNORMAL HIGH (ref 0.44–1.00)
GFR, Estimated: 6 mL/min — ABNORMAL LOW (ref >60)
Glucose, Bld: 101 mg/dL — ABNORMAL HIGH (ref 70–99)
Potassium: 3.5 mmol/L (ref 3.5–5.1)
Sodium: 133 mmol/L — ABNORMAL LOW (ref 135–145)

## 2021-03-25 LAB — PHOSPHORUS: Phosphorus: 5 mg/dL — ABNORMAL HIGH (ref 2.5–4.6)

## 2021-03-25 LAB — HEPATITIS B CORE ANTIBODY, TOTAL: Hep B Core Total Ab: NONREACTIVE

## 2021-03-25 MED ORDER — SODIUM CHLORIDE 0.9 % IV SOLN: 62.5000 mg | INTRAVENOUS | Status: DC

## 2021-03-25 MED ORDER — DARBEPOETIN ALFA 60 MCG/0.3ML IJ SOSY: 60.0000 ug | PREFILLED_SYRINGE | INTRAMUSCULAR | Status: DC

## 2021-03-25 MED ORDER — RENA-VITE PO TABS
1.0000 | ORAL_TABLET | Freq: Every day | ORAL | Status: DC
  Administered 2021-03-25: 1 via ORAL
  Filled 2021-03-25: qty 1

## 2021-03-25 MED ORDER — HEPARIN SODIUM (PORCINE) 1000 UNIT/ML IJ SOLN
INTRAMUSCULAR | Status: AC
  Administered 2021-03-25: 1000 [IU]
  Filled 2021-03-25: qty 4

## 2021-03-25 MED ORDER — MIDODRINE HCL 5 MG PO TABS
ORAL_TABLET | ORAL | Status: AC
  Administered 2021-03-25: 5 mg via ORAL
  Filled 2021-03-25: qty 1

## 2021-03-25 MED ORDER — NEPRO/CARBSTEADY PO LIQD
237.0000 mL | Freq: Two times a day (BID) | ORAL | Status: DC
  Administered 2021-03-25 – 2021-03-26 (×2): 237 mL via ORAL

## 2021-03-25 MED ORDER — CHLORHEXIDINE GLUCONATE CLOTH 2 % EX PADS
6.0000 | MEDICATED_PAD | Freq: Every day | CUTANEOUS | Status: DC
  Administered 2021-03-25: 6 via TOPICAL

## 2021-03-25 MED ORDER — DARBEPOETIN ALFA 60 MCG/0.3ML IJ SOSY
PREFILLED_SYRINGE | INTRAMUSCULAR | Status: AC
  Administered 2021-03-25: 60 ug via INTRAVENOUS
  Filled 2021-03-25: qty 0.3

## 2021-03-25 MED ORDER — METOPROLOL SUCCINATE ER 25 MG PO TB24
12.5000 mg | ORAL_TABLET | Freq: Every day | ORAL | Status: DC
  Filled 2021-03-25 (×2): qty 1

## 2021-03-25 NOTE — Progress Notes (Signed)
Patient placed on CPAP using nasal mask, 13 cmH20, and 4L oxygen as per patient reported home regimen. Patient was on nasal cannula 5L prior to CPAP placement with sats 88%.  After CPAP placement sats came up to 98% on 4L.  Patient is familiar with equipment and procedure.  Patient uses nasal pillows at home.  HR 72, RR 20, BBS diminished, crackles.

## 2021-03-25 NOTE — TOC Initial Note (Addendum)
Transition of Care [facility]) - Initial/Assessment Note    Patient Details  Name: Tiffany [name] MRN: [id number] Date of Birth: 1946-08-02  Transition of Care [facility]) CM/SW Contact:    Erenest Rasher, RN Phone Number: 03/25/2021, 5:09 PM  Clinical Narrative:                 HF TOC CM spoke to pt at bedside. She has gave permission to speak to son, Vonna Kotyk and husband. TOC CM contacted husband and states pt has oxygen with [employer], wheelchair and CPAP. She does wear her CPAP at night. Did not have any problems paying for meds. He take her to her appts. Offered choice for [facility]. States he does not believe HH is needed but will accept if needed at dc. Waiting final recommendations for home. CM/CSW will continue to follow for dc needs.   Expected Discharge Plan: Home/Self Care Barriers to Discharge: Continued Medical Work up   Patient Goals and CMS Choice Patient states their goals for this hospitalization and ongoing recovery are:: husband states he would want [place] if needed at dc CMS Medicare.gov Compare Post Acute Care list provided to:: Patient Represenative (must comment) (husband-Robert) Choice offered to / list presented to : Spouse  Expected Discharge Plan and Services Expected Discharge Plan: Home/Self Care In-house Referral: Clinical Social Work Discharge Planning Services: CM Consult Post Acute Care Choice: [place] arrangements for the past 2 months: [place]                                      Prior Living Arrangements/Services Living arrangements for the past 2 months: Single Family Home Lives with:: Spouse Patient language and need for interpreter reviewed:: Yes        Need for Family Participation in Patient Care: Yes (Comment) Care giver support system in place?: Yes (comment) Current home services: DME (wheelchair, RW, oxygen([employer]), CPAP) Criminal Activity/Legal Involvement Pertinent to Current Situation/Hospitalization: No - Comment as  needed  Activities of Daily Living Home Assistive Devices/Equipment: Wheelchair    Permission Sought/Granted Permission sought to share information with : Case Manager, Family Supports, PCP Permission granted to share information with : Yes, Verbal Permission Granted  Share Information with NAME: Dorlisa Delphia  Permission granted to share info w AGENCY: [place] granted to share info w Relationship: husband  Permission granted to share info w Contact Information: [phone]  Emotional Assessment Appearance:: Appears stated age Attitude/Demeanor/Rapport: Gracious Affect (typically observed): Accepting Orientation: : Oriented to Self   Psych Involvement: No (comment)  Admission diagnosis:  Volume overload [E87.70] Acute respiratory failure ([place]) [J96.00] Patient Active Problem List   Diagnosis Date Noted   Acute respiratory failure ([place]) 03/25/2021   Volume overload 03/22/2021   Mediastinal lymphadenopathy 03/23/2021   Allergy, unspecified, initial encounter 01/21/2021   Anaphylactic shock, unspecified, initial encounter 01/21/2021   Anemia in chronic kidney disease 01/21/2021   Benign paroxysmal vertigo, bilateral [id number]   Complication of vascular dialysis catheter 01/21/2021   End stage renal disease ([place]) 01/21/2021   Iron deficiency anemia, unspecified 01/21/2021   Obstructive sleep apnea 01/21/2021   Primary pulmonary hypertension ([place]) 01/21/2021   Secondary hyperparathyroidism of renal origin ([place]) 01/21/2021   Status post tubal ligation [id number]   Umbilical hernia without obstruction or gangrene 01/21/2021   CKD (chronic kidney disease) stage 5, GFR less than 15 ml/min (HCC) 12/10/2020   Chronic kidney  disease (CKD), stage IV (severe) ([place]) 10/15/2020   Thyrotoxicosis without thyroid storm 12/14/2019   Thyroid nodule 12/14/2019   Secondary hypercoagulable state ([place]) 11/27/2019   Dry mouth 05/03/2018   Presbycusis of both ears 05/03/2018    Mild LV dysfunction    Esophageal stricture    Trigger finger of right thumb 03/09/2017   Persistent atrial fibrillation ([place]) 01/26/2017   Routine general medical examination at a health care facility 10/03/2015   Chronic diastolic CHF (congestive heart failure) ([place]) 09/19/2015   Pulmonary hypertension ([place]) 09/19/2015   Paroxysmal atrial fibrillation (HCC)    Bradycardia    SOB (shortness of breath) 03/26/2015   Atrial fibrillation status post cardioversion, 01/30/15 maintaining SR.  01/31/2015   CREST (calcinosis, Raynaud's phenomenon, esophageal dysfunction, sclerodactyly, telangiectasia) ([place]) 01/29/2015   CKD (chronic kidney disease) stage 4, GFR 15-29 ml/min ([place]) 01/29/2015   Hypothyroidism 09/29/2014   Osteopenia    Arthritis 04/20/2012   Anxiety    Osteoarthritis of right hip 01/13/2010   Obesity-BMI 31 07/01/2009   GERD 07/01/2009   URINARY INCONTINENCE 07/01/2009   PCP:  [name], MD Pharmacy:   [id number] [place] [id number]: [phone]: [phone] [place] [address]Phone: [phone]: [phone]    Social Determinants of Health (SDOH) Interventions    Readmission Risk Interventions No flowsheet data found.

## 2021-03-25 NOTE — Progress Notes (Signed)
[facility] PROGRESS NOTE   Tiffany [name] [id number] DOB: 1945/12/10 DOA: 03/14/2021  PCP: [name], MD  Brief History/Interval Summary: 76 y.o. female with medical history significant of Severe PAH, ESRD on MTTF dialysis, PAF on Eliquis.  Presented to the emergency department with shortness of breath.  Apparently has been on dialysis for the past 8 weeks or so.  She missed her dialysis on the day of admission due to severe neck pain which she attributes to having to sit on the dialysis chair for more than 3 to 4 hours.  Consultants: Nephrology.  Cardiology  Procedures: To undergo hemodialysis today  Antibiotics: Anti-infectives (From admission, onward)    None       Subjective/Interval History: Patient continues to have shortness of breath.  Denies any chest pain.  No nausea or vomiting.  Husband is at the bedside.  Mentions that she fell a few weeks ago.  But her neck pain she thinks predates the fall.  Pain in the neck is mostly in the right side especially when she turns her head to the right.     Assessment/Plan:  Volume overload/cardiorenal syndrome/end-stage renal disease/hyponatremia This is due to missing dialysis session.  Patient is on hemodialysis 4 times a week.  ESRD is due to cardiorenal syndrome.  She has severe pulmonary artery hypertension.  Nephrology has been consulted.  Cardiology is also following.  To be dialyzed today.  Will likely need another session tomorrow as well.  Acute on chronic respiratory failure with hypoxia She is on 3 L of oxygen at home.  Was noted to be hypoxic in the emergency department.  Currently on 5 to 6 L by nasal cannula.  Hopefully will improve once she is dialyzed.  Pulmonary artery hypertension Followed by cardiology.  She is noted to be on riociguat, Opsumit and selexipag.  Management per cardiology.  Paroxysmal atrial fibrillation Continue flecainide.  She is anticoagulated with Eliquis.  On  Toprol-XL.  Monitor on telemetry.  Hypotension This appears to be chronic.  Noted to be on midodrine.  Appears to be asymptomatic currently.  Continue to monitor.  Mediastinal lymphadenopathy Incidentally noted on CT scan.  Needs repeat CT imaging in 3 months.  Normocytic anemia Hemoglobin seems to be around 9-10.  Drop in hemoglobin could be dilutional. No evidence for overt bleeding.  Continue to monitor hemoglobin.     DVT Prophylaxis: On Eliquis Code Status: Full code Family Communication: Discussed with patient and her husband Disposition Plan: Will need to be seen by PT and OT.  Anticipate discharge home in improved  Status is: Observation  The patient will require care spanning > 2 midnights and should be moved to inpatient because: Inpatient level of care appropriate due to severity of illness and significant volume overload due to end-stage renal disease and pulmonary artery hypertension  Dispo: The patient is from: Home              Anticipated d/c is to: Home              Patient currently is not medically stable to d/c.   Difficult to place patient No       Medications: Scheduled:  apixaban  2.5 mg Oral BID   Chlorhexidine Gluconate Cloth  6 each Topical Q0600   darbepoetin (ARANESP) injection - DIALYSIS  60 mcg Intravenous Q Tue-HD   famotidine  20 mg Oral BID   flecainide  50 mg Oral BID   heparin sodium (porcine)  macitentan  10 mg Oral Daily   metoprolol succinate  12.5 mg Oral QHS   midodrine  5 mg Oral 4 times per day   Riociguat  2.5 mg Oral BID   [START ON 2021/04/04] saccharomyces boulardii  250 mg Oral Q M,W,F   Selexipag  2,400 mcg Oral BID   Continuous:  [START ON 03/27/2021] ferric gluconate (FERRLECIT) IVPB     [id number], fluticasone, ondansetron **OR** ondansetron (ZOFRAN) IV   Objective:  Vital Signs  Vitals:   03/25/21 0850 03/25/21 1000 03/25/21 1030 03/25/21 1100  BP: 109/66 (!) 99/47 (!) 94/48 (!) 92/53  Pulse: (!)  101     Resp: 17     Temp: 98.3 F (36.8 C)     TempSrc: Oral     SpO2: 90%     Weight: 56.5 kg     Height:       No intake or output data in the 24 hours ending 03/25/21 1112 Filed Weights   03/13/2021 1506 03/25/21 0850  Weight: 54 kg 56.5 kg    General appearance: Awake alert.  In no distress Resp: Noted to be tachypneic.  No use of accessory muscles.  Crackles bilateral bases.  No wheezing or rhonchi. Cardio: S1-S2 is normal regular.  No S3-S4.  No rubs murmurs or bruit GI: Abdomen is soft.  Nontender nondistended.  Bowel sounds are present normal.  No masses organomegaly Extremities: Edema bilateral lower extremities Neurologic: Alert and oriented x3.  No focal neurological deficits.    Lab Results:  Data Reviewed: I have personally reviewed following labs and imaging studies  CBC: Recent Labs  Lab 03/21/2021 1602 03/25/21 0217  WBC 4.5 5.2  NEUTROABS 3.9  --   HGB 8.3* 7.8*  HCT 26.7* 24.9*  MCV 92.7 92.6  PLT 167 [id number]    Basic Metabolic Panel: Recent Labs  Lab 03/09/2021 1602 03/25/21 0217  NA 133* 133*  K 3.1* 3.5  CL 92* 94*  CO2 27 26  GLUCOSE 95 101*  BUN 45* 45*  CREATININE 5.74* 6.27*  CALCIUM 9.1 9.0  MG 2.0  --     GFR: Estimated Creatinine Clearance: 5.8 mL/min (A) (by C-G formula based on SCr of 6.27 mg/dL (H)).    Recent Results (from the past 240 hour(s))  Resp Panel by RT-PCR (Flu A&B, Covid) Nasopharyngeal Swab     Status: None   Collection Time: 03/22/2021  4:03 PM   Specimen: Nasopharyngeal Swab; Nasopharyngeal(NP) swabs in vial transport medium  Result Value Ref Range Status   SARS Coronavirus 2 by RT PCR NEGATIVE NEGATIVE Final    Comment: (NOTE) SARS-CoV-2 target nucleic acids are NOT DETECTED.  The SARS-CoV-2 RNA is generally detectable in upper respiratory specimens during the acute phase of infection. The lowest concentration of SARS-CoV-2 viral copies this assay can detect is 138 copies/mL. A negative result does not  preclude SARS-Cov-2 infection and should not be used as the sole basis for treatment or other patient management decisions. A negative result may occur with  improper specimen collection/handling, submission of specimen other than nasopharyngeal swab, presence of viral mutation(s) within the areas targeted by this assay, and inadequate number of viral copies(<138 copies/mL). A negative result must be combined with clinical observations, patient history, and epidemiological information. The expected result is Negative.  Fact Sheet for Patients:  EntrepreneurPulse.com.au  Fact Sheet for Healthcare Providers:  IncredibleEmployment.be  This test is no t yet approved or cleared by the [place] FDA and  has been  authorized for detection and/or diagnosis of SARS-CoV-2 by FDA under an Emergency Use Authorization (EUA). This EUA will remain  in effect (meaning this test can be used) for the duration of the COVID-19 declaration under Section 564(b)(1) of the Act, 21 U.S.C.section 360bbb-3(b)(1), unless the authorization is terminated  or revoked sooner.       Influenza A by PCR NEGATIVE NEGATIVE Final   Influenza B by PCR NEGATIVE NEGATIVE Final    Comment: (NOTE) The Xpert Xpress SARS-CoV-2/FLU/RSV plus assay is intended as an aid in the diagnosis of influenza from Nasopharyngeal swab specimens and should not be used as a sole basis for treatment. Nasal washings and aspirates are unacceptable for Xpert Xpress SARS-CoV-2/FLU/RSV testing.  Fact Sheet for Patients: EntrepreneurPulse.com.au  Fact Sheet for Healthcare Providers: IncredibleEmployment.be  This test is not yet approved or cleared by the [place] FDA and has been authorized for detection and/or diagnosis of SARS-CoV-2 by FDA under an Emergency Use Authorization (EUA). This EUA will remain in effect (meaning this test can be used) for the  duration of the COVID-19 declaration under Section 564(b)(1) of the Act, 21 U.S.C. section 360bbb-3(b)(1), unless the authorization is terminated or revoked.  Performed at [facility], [address] [place], [place] [address]       Radiology Studies: CT Angio Chest PE W and/or Wo Contrast  Result Date: 03/31/2021 CLINICAL DATA:  Right-sided neck pain for 8 weeks EXAM: CT ANGIOGRAPHY CHEST WITH CONTRAST TECHNIQUE: Multidetector CT imaging of the chest was performed using the standard protocol during bolus administration of intravenous contrast. Multiplanar CT image reconstructions and MIPs were obtained to evaluate the vascular anatomy. CONTRAST:  77m OMNIPAQUE IOHEXOL 350 MG/ML SOLN COMPARISON:  Same day chest radiograph, CT chest 06/21/2017 FINDINGS: Cardiovascular: Satisfactory opacification of the pulmonary arteries to the segmental level. No evidence of pulmonary embolism. There is cardiomegaly with particular enlargement of the right heart. The main pulmonary artery is enlarged at 3.4 cm. There is reflux of contrast into the IVC and hepatic veins. A right internal jugular dialysis catheter is in place terminating in the right atrium. There is calcified atherosclerotic plaque of the aortic arch. Mediastinum/Nodes: There are prominent mediastinal lymph nodes measuring up to 1.4 cm. There is right hilar lymphadenopathy with a conglomerate measuring up to approximately 2.2 cm x 1.7 cm (4-100). There are enlarged left hilar lymph nodes measuring up to 1.2 cm. These lymph nodes are overall increased in size compared to the CT chest of 06/21/2017. Lungs/Pleura: The trachea and central airways are patent. There is a background of moderate emphysema. There is masslike consolidation in the superior segment of the right lower lobe measuring 2.3 cm by 1.4 cm. There is a calcified granuloma in the right middle lobe. There is a trace right pleural effusion with adjacent relaxation  atelectasis. Otherwise, there is no focal consolidation. There is no left pleural effusion. There is no pneumothorax. Upper Abdomen: The imaged portions of the upper abdominal viscera are unremarkable. Musculoskeletal: There is degenerative change in the lower cervical spine. There is no acute osseous abnormality. A fatty lesion in the right breast is unchanged, consistent with a lipoma. Review of the MIP images confirms the above findings. IMPRESSION: 1. No evidence of acute pulmonary embolus. 2. Cardiomegaly with evidence of right heart failure as above. Enlargement of the pulmonary artery also suggests pulmonary hypertension. 3. Bulky mediastinal and hilar lymphadenopathy and a 2.3 cm x 1.4 cm may be due to heart failure. Recommend short follow up  chest CT in 3 months to reassess. Masslike consolidation in the right lower lobe can also be reassessed at this time. 4. Trace right pleural effusion. 5. Aortic Atherosclerosis (ICD10-I70.0) and Emphysema (ICD10-J43.9). Electronically Signed   By: Valetta Mole M.D.   On: 03/04/2021 18:05   DG Chest Port 1 View  Result Date: 03/25/2021 CLINICAL DATA:  Shortness of breath. EXAM: PORTABLE CHEST 1 VIEW COMPARISON:  09/01/2018 FINDINGS: A new right jugular catheter has been placed and terminates over the lower SVC. The cardiac silhouette is mildly enlarged. Aortic atherosclerosis is noted. There is central pulmonary vascular congestion. Widespread bilateral interstitial densities have mildly increased. Trace pleural effusions are questioned. No pneumothorax or acute osseous abnormality is identified. IMPRESSION: Mildly increased interstitial densities which may reflect mild edema. Electronically Signed   By: Logan Bores M.D.   On: 03/14/2021 16:47       LOS: 0 days   [facility] Pager on [email or web address]  03/25/2021, 11:12 AM

## 2021-03-25 NOTE — Consult Note (Signed)
Renal Service Consult Note [facility]  Tiffany [name] 03/25/2021 Sol Blazing, MD Requesting Physician: Dr. Maryland Pink, [place] Level.   Reason for Consult: ESRD pt w/ SOB HPI: The patient is a 75 y.o. year-old w/ hx of atrial fib, chron systolic CHF, HL, HTN, pulm HTN and ESRD on HD presented to ED w/ c/o  neck pain. Doing in-center HD x last 8 wks, 4days per week. Last HD Friday, didn't go yesterday due to neck pain. In ED pt was SOB, BP 85/ 50. CXR showed diffuse pulm edema. Pt admitted for vol overload/ pulm edema. Asked to see for HD.    Pt seen in dialysis, no CP or cough or fevers.  Mild LE swelling. On HD for 8 wks, hoping to train for home HD soon.   Has severe pHTN and R HF, last LVEF was 60-65% by echo.     ROS - denies CP, no joint pain, no HA, no blurry vision, no rash, no diarrhea, no nausea/ vomiting,  Past Medical History  Past Medical History:  Diagnosis Date   Anemia    Arthritis 04/20/2012   Osteoarthritis-right hip   Atrial fibrillation status post cardioversion, 01/30/15 maintaining SR.  01/31/2015   a. 01/2015 s/p TEE/DCCV;  b. 02/06/2015 recurrent AF noted, amio added;  c. CHA2DS2VASc = 3-->eliquis.   BENIGN POSITIONAL VERTIGO    Cataract    CHF (congestive heart failure) (HCC)    Chronic kidney disease    Complication of anesthesia 04/20/2012   some issues with prolonged sedation after anesthesia   Diverticulosis    DYSLIPIDEMIA    Dyspnea    Dysrhythmia    Esophageal stricture    GERD    HYPERTENSION    a. severe, pt intol of med tx, Pt. has severe "whitecoat" syndrome and refused medical therapy.   Hypothyroidism 09/29/2014   a. pt did not tolerate synthroid and this was subsequently discontinued.   Mild LV dysfunction    a. 01/2015 Echo: EF 45-50%, mild MR, mildly dil LA, mod dil RV with mod to sev reduced fxn, mod-sev TR, PASP 45mHg.   Osteopenia    Pneumonia    Pulmonary hypertension (HCC)    Raynaud disease    CREST   Sleep apnea     Umbilical hernia    URINARY INCONTINENCE 04/20/2012   occ. with nighttime sleep pattern   Past Surgical History  Past Surgical History:  Procedure Laterality Date   ATRIAL FIBRILLATION ABLATION N/A 01/26/2017   Procedure: Atrial Fibrillation Ablation;  Surgeon: AThompson Grayer MD;  [place];  Service: Cardiovascular;  Laterality: N/A;   BASCILIC VEIN TRANSPOSITION Left 12/23/2020   Procedure: [place]  Surgeon: CMarty Heck MD;  [place]  Service: Vascular;  Laterality: Left;   BCamp DennisonLeft 02/14/2021   Procedure: LEFT ARM SECOND STAGE BASILIC VEIN TRANSPOSITION;  Surgeon: CMarty Heck MD;  [place]  Service: Vascular;  Laterality: Left;   breast ultrasound Right 09/13/2013   There is no sonographic evidence of malignancy. the [id number] cyst in (R) breast is consistent with a benign finding. repeat in 1 year   CARDIAC CATHETERIZATION N/A 08/29/2015   Procedure: Right Heart Cath;  Surgeon: DLarey Dresser MD;  [place];  Service: Cardiovascular;  Laterality: N/A;   CARDIOVERSION N/A 01/30/2015   Procedure: CARDIOVERSION;  Surgeon: MSanda Klein MD;  [facility];  Service: Cardiovascular;  Laterality: N/A;  CATARACT EXTRACTION, BILATERAL     CHOLECYSTECTOMY     '90-"sludge"   EYE SURGERY     JOINT REPLACEMENT     NASAL FRACTURE SURGERY  2006   RIGHT HEART CATH N/A 06/30/2017   Procedure: RIGHT HEART CATH;  Surgeon: Larey Dresser, MD;  Location: [place] [facility];  Service: Cardiovascular;  Laterality: N/A;   RIGHT HEART CATH N/A 08/09/2020   Procedure: RIGHT HEART CATH;  Surgeon: Larey Dresser, MD;  Location: [place] [facility];  Service: Cardiovascular;  Laterality: N/A;   TEE WITHOUT CARDIOVERSION N/A 01/30/2015   Procedure: TRANSESOPHAGEAL ECHOCARDIOGRAM (TEE);  Surgeon: Sanda Klein, MD;  Location: [facility]OSCOPY;  Service:  Cardiovascular;  Laterality: N/A;   TEE WITHOUT CARDIOVERSION N/A 01/25/2017   Procedure: TRANSESOPHAGEAL ECHOCARDIOGRAM (TEE);  Surgeon: Fay Records, MD;  Location: [facility];  Service: Cardiovascular;  Laterality: N/A;   TOTAL HIP ARTHROPLASTY  04/26/2012   Procedure: TOTAL HIP ARTHROPLASTY ANTERIOR APPROACH;  Surgeon: Mauri Pole, MD;  Location: [facility];  Service: Orthopedics;  Laterality: Right;   TUBAL LIGATION  1980   Family History  Family History  Adopted: Yes  Problem Relation Age of Onset   Other Other        patient was adopted   Social History  reports that she has never smoked. She has never used smokeless tobacco. She reports current alcohol use. She reports that she does not use drugs. Allergies  Allergies  Allergen Reactions   Levothyroxine Shortness Of Breath and Other (See Comments)    Exhaustion also - Per pt her this medication could have not been the reason for her symptoms   Statins Other (See Comments)    "Pain, weakness and kidney problems"   Prednisone Other (See Comments)    Severe insomnia   Penicillins Other (See Comments)    dry mouth Has patient had a PCN reaction causing immediate rash, facial/tongue/throat swelling, SOB or lightheadedness with hypotension: No Has patient had a PCN reaction causing severe rash involving mucus membranes or skin necrosis: No Has patient had a PCN reaction that required hospitalization No Has patient had a PCN reaction occurring within the last 10 years: No If all of the above answers are "NO", then may proceed with Cephalosporin use.    Home medications Prior to Admission medications   Medication Sig Start Date End Date Taking? Authorizing Provider  acetaminophen (TYLENOL) 325 MG tablet Take 2 tablets (650 mg total) by mouth every 4 (four) hours as needed for headache or mild pain. 01/29/15  Yes Kilroy, Luke K, PA-C  ELIQUIS 5 MG TABS tablet TAKE 1 TABLET BY MOUTH TWICE A DAY Patient taking differently: Take 5  mg by mouth 2 (two) times daily. 08/20/20  Yes Troy Sine, MD  famotidine (PEPCID) 20 MG tablet TAKE 1 TABLET BY MOUTH TWICE A DAY Patient taking differently: Take 20 mg by mouth 2 (two) times daily. 06/26/20  Yes Larey Dresser, MD  flecainide (TAMBOCOR) 50 MG tablet Take 1 tablet (50 mg total) by mouth 2 (two) times daily. 03/03/21  Yes Fenton, Clint R, PA  macitentan (OPSUMIT) 10 MG tablet Take 1 tablet (10 mg total) by mouth daily. 08/29/20  Yes Larey Dresser, MD  midodrine (PROAMATINE) 5 MG tablet Take 5 mg by mouth QID. Takes 4x/day, 3 hours apart.   Yes [provider]  Probiotic Product (PROBIOTIC-10) CAPS Take 1 capsule by mouth every Monday, Wednesday, and Friday.   Yes [provider]  Riociguat (ADEMPAS) 2.5 MG TABS Take 2.5 mg by mouth 2 (two) times daily.   Yes [provider]  Selexipag (UPTRAVI) 1200 MCG TABS Take 2 tablets by mouth in the morning and at bedtime. Patient taking differently: Take 2,400 mcg by mouth in the morning and at bedtime. 12/27/20  Yes Larey Dresser, MD  fluticasone [facility]) 50 MCG/ACT nasal spray Place 1 spray daily as needed into both nostrils for allergies or rhinitis.    [provider]     Vitals:   03/25/21 0500 03/25/21 0754 03/25/21 0850 03/25/21 1000  BP:  (!) 91/50 109/66 (!) 99/47  Pulse:  75 (!) 101   Resp:  19 17   Temp: 98.7 F (37.1 C) 98 F (36.7 C) 98.3 F (36.8 C)   TempSrc: Oral Oral Oral   SpO2:  90% 90%   Weight:   56.5 kg   Height:       Exam Gen alert, no distress No rash, cyanosis or gangrene Sclera anicteric, throat clear  No jvd or bruits Chest mild rales bilat at bases RRR no MRG Abd soft ntnd no mass or ascites +bs GU normal MS no joint effusions or deformity Ext 1+ pretib edema bilat, no wounds or ulcers Neuro is alert, Ox 3 , nf  Maturing LUA AVF, R IJ TDC         Home meds include eliquis, pepcid, flecainide, macitentan 10 qd, midodrine '5mg'$  qid, Riociguat  2.5 bid, Selexipag 2400 ug bid     [facility] HD: [place]  MTTh[facility]   3h  2/2 bath   55kg  250/500   LUA AVF maturing/ RIJ TDC  - mircera 60ug q 2, due now  - venofer 50 weekly   Assessment/ Plan: SOB/ pulm edema - due to vol overload. Not in distress. Plan HD this am.  ESRD - recent starts 2 mos ago. 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 Is on HD 4x per week at OP unit. Missed HD yesterday.  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

## 2021-03-25 NOTE — Progress Notes (Signed)
Patient declined CPAP tonight. Unit still at bedside if needed. RN in room at this time and stated she would call if I needed to put patient on.

## 2021-03-25 NOTE — Progress Notes (Signed)
Pharmacy Consult for Pulmonary Hypertension Treatment   Indication - Continuation of prior to admission medication   Patient is 75 y.o.  with history of PAH on chronic Macitentan (Opsumit) PTA and will be continued while hospitalized.   Continuing this medication order as an inpatient requires that monitoring parameters per REMS requirements must be met.  Chronic therapy is under the supervision of Dr Loralie Champagne who is enrolled in the REMS program and is being notified of continuation of therapy. A staff message in EPIC has been sent notifying the certified prescriber.  Per patient report has previously been educated on Pregnancy Risk. On admission pregnancy risk has been assessed and no monitoring required. Patient is post-menopausal.  Hepatic function has been evaluated. AST/ALT appropriate to continue medication at this time.  Hepatic Function Latest Ref Rng & Units 10/29/2020 10/03/2020 04/19/2019  Total Protein 6.0 - 8.3 g/dL - - 7.6  Albumin 3.5 - 5.0 3.9 3.9 4.3  AST 0 - 37 U/L - - 19  ALT 0 - 35 U/L - - 14  Alk Phosphatase 39 - 117 U/L - - 54  Total Bilirubin 0.2 - 1.2 mg/dL - - 0.8  Bilirubin, Direct 0.0 - 0.3 mg/dL - - -    If any question arise or pregnancy is identified during hospitalization, contact for bosentan and macitentan: [phone]; ambrisentan: [phone].  Thank for you allowing [place] to participate in the care of this patient.   Antonietta Jewel, PharmD, [place] Clinical Pharmacist  Phone: [phone] 03/25/2021 10:23 AM  Please check AMION for all [facility] phone numbers After 10:00 PM, call [place] [phone]

## 2021-03-25 NOTE — Progress Notes (Signed)
Initial Nutrition Assessment  DOCUMENTATION CODES:   Not applicable  INTERVENTION:   Nepro Shake po BID, each supplement provides 425 kcal and 19 grams protein. Renal MVI daily.  NUTRITION DIAGNOSIS:   Increased nutrient needs related to chronic illness (ESRD on HD) as evidenced by estimated needs.  GOAL:   Patient will meet greater than or equal to 90% of their needs  MONITOR:   PO intake, Supplement acceptance, Labs  REASON FOR ASSESSMENT:   Malnutrition Screening Tool    ASSESSMENT:   75 yo female admitted with SOB, volume overload after missing HD session. PMH includes severe PAH, ESRD on HD 4 days per week, cardiorenal syndrome, PAF, HLD, HTN, esophageal stricture, CHF, CREST.  S/P HD today.  Patient is planning to transition to home HD soon.  She is on 3 L oxygen at home, currently requiring 5-6 L.  Labs reviewed. Na 133, phos 5 Corrected calcium WDL at 9.96  Medications reviewed and include aranesp, pepcid, Florastor, ferric gluconate.  Usual weights reviewed. Patient weighed 59 kg 3 months ago. Currently 54 kg. 8.5% weight loss within the past 3 months is severe. Patient has been on HD for 8-9 weeks, so some of the weight loss could be related to volume removal with HD.  Unsure of EDW.  NUTRITION - FOCUSED PHYSICAL EXAM:  Unable to complete  Diet Order:   Diet Order             Diet renal/carb modified with fluid restriction Diet-HS Snack? Nothing; Fluid restriction: 1200 mL Fluid; Room service appropriate? Yes; Fluid consistency: Thin  Diet effective now                   EDUCATION NEEDS:   Not appropriate for education at this time  Skin:  Skin Assessment: Reviewed RN Assessment  Last BM:  no BM documented  Height:   Ht Readings from Last 1 Encounters:  03/05/2021 '4\' 11"'$  (1.499 m)    Weight:   Wt Readings from Last 1 Encounters:  03/25/21 54 kg    Ideal Body Weight:  44.7 kg  BMI:  Body mass index is 24.04  kg/m.  Estimated Nutritional Needs:   Kcal:  [id number]  Protein:  70-80 gm  Fluid:  1 L + UOP    Lucas Mallow, RD, LDN, CNSC Please refer to Amion for contact information.

## 2021-03-25 NOTE — Progress Notes (Signed)
  Echocardiogram 2D Echocardiogram has been performed.  Tiffany [name] 03/25/2021, 3:39 PM

## 2021-03-25 NOTE — Consult Note (Addendum)
Advanced Heart Failure Team Consult Note   Primary Physician: [name], MD PCP-Cardiologist:  Dr. [name]   Reason for Consultation: Pulmonary HTN Med Management/ Acute on Chronic Rt Sided HF   HPI:    Tiffany [name] is seen today for evaluation of pulmonary HTN med management/ acute on chronic right sided HF at the request of Dr. Maryland Pink, Internal Medicine.   75 y.o. with history of paroxysmal atrial fibrillation and diastolic CHF was noted to have significant pulmonary hypertension and exertional dyspnea.  She has history of paroxysmal atrial fibrillation and had an episode in 6/16 requiring cardioversion.  She was started on amiodarone but this was stopped with worsening breathing.  Cardiolite in 9/16 showed on ischemia or infarction.      [facility] 1/17 showed elevated left and right heart filling pressures but also PAH. She has since been managed w/ combination therapy, on Uptravi, riociguat and Opsumit.    Echo was done in 6/17, RV mildly dilated with moderately decreased systolic function and PASP 85 mmHg.     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    She had an atrial fibrillation ablation in 6/18.    RHC in 11/18 showed lower PA pressure than in the past with PA 54/16 and PVR 3.7 WU.  Echo showed preserved LV systolic function and normal RV size and function.  Holter showed PACs and PVCs, no atrial fibrillation.  She was started on Toprol XL.  CT chest did not show any significant lung findings. Echo in 12/19 showed EF 65-70%, moderate diastolic dysfunction, mild AS, mild MR, PASP 47 mmHg, normal RV.    She was started on flecainide for paroxysmal atrial fibrillation and has not had symptomatic atrial fibrillation for months now.    Echo in 1/21 showed EF 60-65%, moderately dilated RV with normal systolic function, mild-moderate TR with PASP 88 mmHg, IVC normal.    RHC in 1/22 showed normal filling pressures but still with severe  pulmonary hypertension.  Cardiac output was preserved.  Echo in 1/22 showed EF 55-60%, moderate RV dilation with moderately decreased systolic function, moderate TR, PASP 117 mmHg with dilated IVC.    Last [facility] f/u 6/22, she was markedly volume overloaded w/ NYHA class IV symptoms and felt to be nearing HD. Torsemide was increased to 100 qp/ 80 qpm in an effort to diurese and she was continued on twice weekly metolazone.    She now has ESRD and is on HD 4 days a week.   She presented to the [facility] ED on 8/22, primarily for constant neck pain which she had attributed to mal posture while getting HD. Minimal relive w/ Tylenol. Pain was so severe that she missed a HD session. In the ED, she was found to be subsequently fluid overloaded and w/ increased O2 requirements, up to 5L [place] (home baseline 3L/min). Chest CT showed degenerative change in the lower cervical spine. There is no acute osseous abnormality. No PE. CXR showed mild edema. She was admitted for further management.   Nephrology consulted and managing HD. AHF team asked to assist w/ management of [place] meds. She has had issues w/ hypotension, SBPs 78-91. Admit EKG showed junctional rhythm 68 bpm. She is on midodrine 5 tid. Current PH meds: Opsumit 10 daily, Riociquat 2.5 BID, Selexipag 2,400 mcg bid. On Flecainide and Apixaban for AF.   She is currently getting HD, current pull rate 250 cc/hr. SBP upper 90s.  She reports worsening functional status at home, NYHA Class IIIb-IV.    Most recent Echo 08/27/20  1. Left ventricular ejection fraction, by estimation, is 55 to 60%. The left ventricle has normal function. The left ventricle has no regional wall motion abnormalities. Left ventricular diastolic parameters are consistent with Grade II diastolic dysfunction (pseudonormalization). 2. Right ventricular systolic function is moderately reduced. The right ventricular size is moderately enlarged. There is severely elevated pulmonary artery systolic  pressure. The estimated right ventricular systolic pressure is [id number] mmHg. D-shaped interventricular septum suggestive of RV pressure/volume overload. 3. Left atrial size was mildly dilated. 4. Right atrial size was moderately dilated. 5. The mitral valve is normal in structure. Trivial mitral valve regurgitation. No evidence of mitral stenosis. 6. Tricuspid valve regurgitation is moderate. 7. The aortic valve is tricuspid. Aortic valve regurgitation is not visualized. Mild aortic valve sclerosis is present, with no evidence of aortic valve stenosis. 8. The inferior vena cava is dilated in size with <50% respiratory variability, suggesting right atrial pressure of 15 mmHg.   Review of Systems: [y] = yes, '[ ]'$  = no   General: Weight gain '[ ]'$ ; Weight loss '[ ]'$ ; Anorexia '[ ]'$ ; Fatigue '[ ]'$ ; Fever '[ ]'$ ; Chills '[ ]'$ ; Weakness '[ ]'$   Cardiac: Chest pain/pressure '[ ]'$ ; Resting SOB [ Y]; Exertional SOB [Y ]; Orthopnea '[ ]'$ ; Pedal Edema '[ ]'$ ; Palpitations '[ ]'$ ; Syncope '[ ]'$ ; Presyncope '[ ]'$ ; Paroxysmal nocturnal dyspnea'[ ]'$   Pulmonary: Cough '[ ]'$ ; Wheezing'[ ]'$ ; Hemoptysis'[ ]'$ ; Sputum '[ ]'$ ; Snoring '[ ]'$   GI: Vomiting'[ ]'$ ; Dysphagia'[ ]'$ ; Melena'[ ]'$ ; Hematochezia '[ ]'$ ; Heartburn'[ ]'$ ; Abdominal pain '[ ]'$ ; Constipation '[ ]'$ ; Diarrhea '[ ]'$ ; BRBPR '[ ]'$   GU: Hematuria'[ ]'$ ; Dysuria '[ ]'$ ; Nocturia'[ ]'$   Vascular: Pain in legs with walking '[ ]'$ ; Pain in feet with lying flat '[ ]'$ ; Non-healing sores '[ ]'$ ; Stroke '[ ]'$ ; TIA '[ ]'$ ; Slurred speech '[ ]'$ ;  Neuro: Headaches'[ ]'$ ; Vertigo'[ ]'$ ; Seizures'[ ]'$ ; Paresthesias'[ ]'$ ;Blurred vision '[ ]'$ ; Diplopia '[ ]'$ ; Vision changes '[ ]'$   Ortho/Skin: Arthritis '[ ]'$ ; Joint pain [ Y]; Muscle pain [ Y]; Joint swelling '[ ]'$ ; Back Pain '[ ]'$ ; Rash '[ ]'$   Psych: Depression'[ ]'$ ; Anxiety'[ ]'$   Heme: Bleeding problems '[ ]'$ ; Clotting disorders '[ ]'$ ; Anemia '[ ]'$   Endocrine: Diabetes '[ ]'$ ; Thyroid dysfunction'[ ]'$   Home Medications Prior to Admission medications   Medication Sig Start Date End Date Taking? Authorizing Provider  acetaminophen  (TYLENOL) 325 MG tablet Take 2 tablets (650 mg total) by mouth every 4 (four) hours as needed for headache or mild pain. 01/29/15  Yes Kilroy, Luke K, PA-C  ELIQUIS 5 MG TABS tablet TAKE 1 TABLET BY MOUTH TWICE A DAY Patient taking differently: Take 5 mg by mouth 2 (two) times daily. 08/20/20  Yes Troy Sine, MD  famotidine (PEPCID) 20 MG tablet TAKE 1 TABLET BY MOUTH TWICE A DAY Patient taking differently: Take 20 mg by mouth 2 (two) times daily. 06/26/20  Yes Larey Dresser, MD  flecainide (TAMBOCOR) 50 MG tablet Take 1 tablet (50 mg total) by mouth 2 (two) times daily. 03/03/21  Yes Fenton, Clint R, PA  macitentan (OPSUMIT) 10 MG tablet Take 1 tablet (10 mg total) by mouth daily. 08/29/20  Yes Larey Dresser, MD  midodrine (PROAMATINE) 5 MG tablet Take 5 mg by mouth QID. Takes 4x/day, 3 hours apart.   Yes [provider]  Probiotic Product (PROBIOTIC-10) CAPS Take 1 capsule by mouth  every Monday, Wednesday, and Friday.   Yes [provider]  Riociguat (ADEMPAS) 2.5 MG TABS Take 2.5 mg by mouth 2 (two) times daily.   Yes [provider]  Selexipag (UPTRAVI) 1200 MCG TABS Take 2 tablets by mouth in the morning and at bedtime. Patient taking differently: Take 2,400 mcg by mouth in the morning and at bedtime. 12/27/20  Yes Larey Dresser, MD  fluticasone [facility]) 50 MCG/ACT nasal spray Place 1 spray daily as needed into both nostrils for allergies or rhinitis.    [provider]    Past Medical History: Past Medical History:  Diagnosis Date   Anemia    Arthritis 04/20/2012   Osteoarthritis-right hip   Atrial fibrillation status post cardioversion, 01/30/15 maintaining SR.  01/31/2015   a. 01/2015 s/p TEE/DCCV;  b. 02/06/2015 recurrent AF noted, amio added;  c. CHA2DS2VASc = 3-->eliquis.   BENIGN POSITIONAL VERTIGO    Cataract    CHF (congestive heart failure) (HCC)    Chronic kidney disease    Complication of anesthesia 04/20/2012   some issues with  prolonged sedation after anesthesia   Diverticulosis    DYSLIPIDEMIA    Dyspnea    Dysrhythmia    Esophageal stricture    GERD    HYPERTENSION    a. severe, pt intol of med tx, Pt. has severe "whitecoat" syndrome and refused medical therapy.   Hypothyroidism 09/29/2014   a. pt did not tolerate synthroid and this was subsequently discontinued.   Mild LV dysfunction    a. 01/2015 Echo: EF 45-50%, mild MR, mildly dil LA, mod dil RV with mod to sev reduced fxn, mod-sev TR, PASP 9mHg.   Osteopenia    Pneumonia    Pulmonary hypertension (HCC)    Raynaud disease    CREST   Sleep apnea    Umbilical hernia    URINARY INCONTINENCE 04/20/2012   occ. with nighttime sleep pattern    Past Surgical History: Past Surgical History:  Procedure Laterality Date   ATRIAL FIBRILLATION ABLATION N/A 01/26/2017   Procedure: Atrial Fibrillation Ablation;  Surgeon: AThompson Grayer MD;  [facility];  Service: Cardiovascular;  Laterality: N/A;   BASCILIC VEIN TRANSPOSITION Left 12/23/2020   Procedure: LEFT FIRST STAGE BSentinel  Surgeon: CMarty Heck MD;  [place] [facility]  Service: Vascular;  Laterality: Left;   [place]   Procedure: LEFT ARM SECOND STAGE BASILIC VEIN TRANSPOSITION;  Surgeon: CMarty Heck MD;  [place]  Service: Vascular;  Laterality: Left;   breast ultrasound Right 09/13/2013   There is no sonographic evidence of malignancy. the [id number] cyst in (R) breast is consistent with a benign finding. repeat in 1 year   CARDIAC CATHETERIZATION N/A 08/29/2015   Procedure: Right Heart Cath;  Surgeon: DLarey Dresser MD;  [facility];  Service: Cardiovascular;  Laterality: N/A;   CARDIOVERSION N/A 01/30/2015   Procedure: CARDIOVERSION;  Surgeon: MSanda Klein MD;  [facility];  Service: Cardiovascular;  Laterality: N/A;   CATARACT EXTRACTION, BILATERAL      CHOLECYSTECTOMY     '90-"sludge"   EYE SURGERY     JOINT REPLACEMENT     NASAL FRACTURE SURGERY  2006   RIGHT HEART CATH N/A 06/30/2017   Procedure: RIGHT HEART CATH;  Surgeon: MLarey Dresser MD;  [facility];  Service: Cardiovascular;  Laterality: N/A;   RIGHT HEART CATH N/A 08/09/2020   Procedure:  RIGHT HEART CATH;  Surgeon: Larey Dresser, MD;  [facility];  Service: Cardiovascular;  Laterality: N/A;   TEE WITHOUT CARDIOVERSION N/A 01/30/2015   Procedure: TRANSESOPHAGEAL ECHOCARDIOGRAM (TEE);  Surgeon: Sanda Klein, MD;  Location: [facility] ENDOSCOPY;  Service: Cardiovascular;  Laterality: N/A;   TEE WITHOUT CARDIOVERSION N/A 01/25/2017   Procedure: TRANSESOPHAGEAL ECHOCARDIOGRAM (TEE);  Surgeon: Fay Records, MD;  Location[facility] END[facility]COPY;  Service: Cardiovascular;  Laterality: N/A;   TOTAL HIP ARTHROPLASTY  04/26/2012   Procedure: TOTAL HIP ARTHROPLASTY ANTERIOR APPROACH;  Surgeon: Mauri Pole, MD;  Location: [facility];  Service: Orthopedics;  Laterality: Right;   TUBAL LIGATION  1980    Family History: Family History  Adopted: Yes  Problem Relation Age of Onset   Other Other        patient was adopted    Social History: Social History   Socioeconomic History   Marital status: Married    Spouse name: [name]   Number of children: 2   Years of education: Not on file   Highest education level: Not on file  Occupational History   Occupation: retired  Tobacco Use   Smoking status: Never   Smokeless tobacco: Never  Vaping Use   Vaping Use: Never used  Substance and Sexual Activity   Alcohol use: Yes    Comment: less than 1 drink per week   Drug use: No   Sexual activity: Yes  Other Topics Concern   Not on file  Social History Narrative   She enjoys birding.  Lives at home with husband.   Married.   retired Quarry manager, Tax inspector   Social Determinants of Radio broadcast assistant Strain: Not on [employer]  Insecurity: Not on file  Transportation Needs: Not on file  Physical Activity: Not on file  Stress: Not on file  Social Connections: Not on file    Allergies:  Allergies  Allergen Reactions   Levothyroxine Shortness Of Breath and Other (See Comments)    Exhaustion also - Per pt her this medication could have not been the reason for her symptoms   Statins Other (See Comments)    "Pain, weakness and kidney problems"   Prednisone Other (See Comments)    Severe insomnia   Penicillins Other (See Comments)    dry mouth Has patient had a PCN reaction causing immediate rash, facial/tongue/throat swelling, SOB or lightheadedness with hypotension: No Has patient had a PCN reaction causing severe rash involving mucus membranes or skin necrosis: No Has patient had a PCN reaction that required hospitalization No Has patient had a PCN reaction occurring within the last 10 years: No If all of the above answers are "NO", then may proceed with Cephalosporin use.     Objective:    Vital Signs:   Temp:  [97.9 F (36.6 C)-98.7 F (37.1 C)] 98 F (36.7 C) (08/23 0754) Pulse Rate:  [64-75] 75 (08/23 0754) Resp:  [15-26] 19 (08/23 0754) BP: (78-95)/(42-58) 91/50 (08/23 0754) SpO2:  [87 %-98 %] 90 % (08/23 0754) Weight:  [54 kg] 54 kg (08/22 1506)    Weight change: Filed Weights   03/13/2021 1506  Weight: 54 kg    Intake/Output:  No intake or output data in the 24 hours ending 03/25/21 0919    Physical Exam    General:  Well appearing elderly WF. No resp difficulty HEENT: normal Neck: supple. Distended neck veins JVP elevated to ear . Carotids 2+ bilat;  no bruits. No lymphadenopathy or thyromegaly appreciated. Cor: PMI nondisplaced. Regular rate & rhythm. No rubs, gallops or murmurs. Lungs: decreased BS at the bases  Abdomen: soft, nontender, nondistended. No hepatosplenomegaly. No bruits or masses. Good bowel sounds. Extremities: no cyanosis, clubbing, rash, 1-2+ bilateral LEE  edema Neuro: alert & orientedx3, cranial nerves grossly intact. moves all 4 extremities w/o difficulty. Affect pleasant  Telemetry   NSR 80s   EKG    ? Junctional rhythm 68 bpm, vs NSR (? Low voltage P waves) IVCD   Labs   Basic Metabolic Panel: Recent Labs  Lab 03/06/2021 1602 03/25/21 0217  NA 133* 133*  K 3.1* 3.5  CL 92* 94*  CO2 27 26  GLUCOSE 95 101*  BUN 45* 45*  CREATININE 5.74* 6.27*  CALCIUM 9.1 9.0  MG 2.0  --     Liver Function Tests: No results for input(s): AST, ALT, ALKPHOS, BILITOT, PROT, ALBUMIN in the last 168 hours. No results for input(s): LIPASE, AMYLASE in the last 168 hours. No results for input(s): AMMONIA in the last 168 hours.  CBC: Recent Labs  Lab 03/14/2021 1602 03/25/21 0217  WBC 4.5 5.2  NEUTROABS 3.9  --   HGB 8.3* 7.8*  HCT 26.7* 24.9*  MCV 92.7 92.6  PLT 167 170    Cardiac Enzymes: No results for input(s): CKTOTAL, CKMB, CKMBINDEX, TROPONINI in the last 168 hours.  BNP: BNP (last 3 results) Recent Labs    03/04/2021 1602  BNP 2,088.5*    ProBNP (last 3 results) No results for input(s): PROBNP in the last 8760 hours.   CBG: No results for input(s): GLUCAP in the last 168 hours.  Coagulation Studies: No results for input(s): LABPROT, INR in the last 72 hours.   Imaging   CT Angio Chest PE W and/or Wo Contrast  Result Date: 04/02/2021 CLINICAL DATA:  Right-sided neck pain for 8 weeks EXAM: CT ANGIOGRAPHY CHEST WITH CONTRAST TECHNIQUE: Multidetector CT imaging of the chest was performed using the standard protocol during bolus administration of intravenous contrast. Multiplanar CT image reconstructions and MIPs were obtained to evaluate the vascular anatomy. CONTRAST:  45m OMNIPAQUE IOHEXOL 350 MG/ML SOLN COMPARISON:  Same day chest radiograph, CT chest 06/21/2017 FINDINGS: Cardiovascular: Satisfactory opacification of the pulmonary arteries to the segmental level. No evidence of pulmonary embolism. There is  cardiomegaly with particular enlargement of the right heart. The main pulmonary artery is enlarged at 3.4 cm. There is reflux of contrast into the IVC and hepatic veins. A right internal jugular dialysis catheter is in place terminating in the right atrium. There is calcified atherosclerotic plaque of the aortic arch. Mediastinum/Nodes: There are prominent mediastinal lymph nodes measuring up to 1.4 cm. There is right hilar lymphadenopathy with a conglomerate measuring up to approximately 2.2 cm x 1.7 cm (4-100). There are enlarged left hilar lymph nodes measuring up to 1.2 cm. These lymph nodes are overall increased in size compared to the CT chest of 06/21/2017. Lungs/Pleura: The trachea and central airways are patent. There is a background of moderate emphysema. There is masslike consolidation in the superior segment of the right lower lobe measuring 2.3 cm by 1.4 cm. There is a calcified granuloma in the right middle lobe. There is a trace right pleural effusion with adjacent relaxation atelectasis. Otherwise, there is no focal consolidation. There is no left pleural effusion. There is no pneumothorax. Upper Abdomen: The imaged portions of the upper abdominal viscera are unremarkable. Musculoskeletal: There is degenerative change in the lower  cervical spine. There is no acute osseous abnormality. A fatty lesion in the right breast is unchanged, consistent with a lipoma. Review of the MIP images confirms the above findings. IMPRESSION: 1. No evidence of acute pulmonary embolus. 2. Cardiomegaly with evidence of right heart failure as above. Enlargement of the pulmonary artery also suggests pulmonary hypertension. 3. Bulky mediastinal and hilar lymphadenopathy and a 2.3 cm x 1.4 cm may be due to heart failure. Recommend short follow up chest CT in 3 months to reassess. Masslike consolidation in the right lower lobe can also be reassessed at this time. 4. Trace right pleural effusion. 5. Aortic Atherosclerosis  (ICD10-I70.0) and Emphysema (ICD10-J43.9). Electronically Signed   By: Valetta Mole M.D.   On: 03/21/2021 18:05   DG Chest Port 1 View  Result Date: 03/22/2021 CLINICAL DATA:  Shortness of breath. EXAM: PORTABLE CHEST 1 VIEW COMPARISON:  09/01/2018 FINDINGS: A new right jugular catheter has been placed and terminates over the lower SVC. The cardiac silhouette is mildly enlarged. Aortic atherosclerosis is noted. There is central pulmonary vascular congestion. Widespread bilateral interstitial densities have mildly increased. Trace pleural effusions are questioned. No pneumothorax or acute osseous abnormality is identified. IMPRESSION: Mildly increased interstitial densities which may reflect mild edema. Electronically Signed   By: Logan Bores M.D.   On: 03/23/2021 16:47     Medications:     Current Medications:  apixaban  2.5 mg Oral BID   Chlorhexidine Gluconate Cloth  6 each Topical Q0600   famotidine  20 mg Oral BID   flecainide  50 mg Oral BID   macitentan  10 mg Oral Daily   midodrine  5 mg Oral 4 times per day   Riociguat  2.5 mg Oral BID   [START ON April 22, 2021] saccharomyces boulardii  250 mg Oral Q M,W,F   Selexipag  2,400 mcg Oral BID    Infusions:   Assessment/Plan   1. Pulmonary hypertension: Patient has pulmonary arterial hypertension.  She appears to have co-existing diastolic LV dysfunction given elevated PCWP on prior RHC in 1/17.  PFTs did not show significant obstruction, they only showed moderately decreased DLCO consistent with pulmonary vascular disease. V/Q scan did not show evidence for chronic PE.  PVR 7.6 WU by Fick and 8.4 WU by thermodiluation on 1/17 RHC.  Cardiac index was low, 1.97 Fick/1.8 thermo. 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  Given worsening symptoms, RHC was repeated in 11/18.   This showed PVR 3.7 WU with mean PA pressure 32 and normal CI.  RHC done in 1/22 showed normal filling pressures and cardiac output but still with severe pulmonary hypertension.   Echo in 1/22 showed normal LV systolic function but moderately dilated and dysfunctional RV.   - She is on home oxygen w/ recent increased in demand, secondary to fluid overload/missed HD session. C/w HD for fluid removal  - Continue riociguat  2.5 mg tid on non HD days, bid on HD days - She has sleep apnea, now using CPAP.  - Continue Opsumit 10 mg daily  - Continue Uptravi 2400 mcg bid.  - Can titrate midodrine as needed to help support BP  2. 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 Most recent echo 1/22 showed normal LV systolic function but moderately dilated and dysfunctional RV, in setting of severe PH. She is markedly  fluid overloaded w/ worsening NYHA functional class, now IIIb-IV. Missed recent HD session as noted above.  - Resume regular iHD schedule per nephrology.  - fluid restrict  - c/w PH meds per above  3. ESRD - HD per nephrology  4. Atrial fibrillation: Paroxysmal. She had atrial fibrillation ablation in 6/18. Maintaining NSR w/ flecainide. ? Junctional rhythm on admit EKG. Currently off ? blocker (held for low BP). Need to resume low dose AVN blocker in setting of Flecainide use - C/w Flecainide 50 mg bid - Restart low dose Toprol XL, 12.5 mg qhs - Repeat 12 lead EKG  - Continue apixaban 2.5 mg bid  5. CREST syndrome: Seeing Dr. Amil Amen.  6. Neck Pain/ DJD - conservative management per primary  7. Anemia: Hgb 7.8. ? Anemia of chronic disease from CKD - check iron stores    Length of Stay: 0  Lyda Jester, PA-C  03/25/2021, 9:19 AM  Advanced Heart Failure Team Pager [phone] (M-F; 7a - 5p)  Please contact [facility] for night-coverage after hours (4p -7a ) and weekends on [email or web address]  Patient seen with PA, agree with the above note.   Patient has the  above history, has been on HD for about 2 months with plan to train for home HD.  She has had severe neck and back pain that she attributes to sitting in the chair at dialysis, currently dialyzing 4 days a week for adequate volume removal.  She went to the ER due to pain yesterday and missed her regular HD session.  She had not had HD since Friday and developed significant dyspnea with worsening oxygen requirement (up to 5L from her baseline 3L).  She was admitted for emergent HD, cardiology asked to weigh in on her cardiac medication management.   General: NAD Neck: JVP 12 cm, no thyromegaly or thyroid nodule.  Lungs: Clear to auscultation bilaterally with normal respiratory effort. CV: Nondisplaced PMI.  Heart regular S1/S2, no S3/S4, no murmur.  1+ edema to knees.  Abdomen: Soft, nontender, no hepatosplenomegaly, no distention.  Skin: Intact without lesions or rashes.  Neurologic: Alert and oriented x 3.  Psych: Normal affect. Extremities: No clubbing or cyanosis.  HEENT: Normal.   She is volume overloaded on exam after no HD x 3 days.  Currently getting HD, may need again tomorrow then resume her regular Mon/Tues/Thurs/Fri schedule.  Pulmonary hypertension/RV failure makes volume management difficult.  Hopefully home HD will be a better option for her.   She is in NSR currently, ECG from ER may be NSR with low voltage P's.  With creatinine > 2 and weight < 60 kg, will decrease apixaban to 2.5 mg bid.  She can continue low dose flecainide but needs to take a nodal blocker while on flecainide.  Would start her back on Toprol XL 12.5 mg qhs.    Patient has severe PAH with RV failure. Would continue selexipag an Opsumit, these should not affect BP.  Riociguat may have a minor effect on BP, would take it tid on non-HD days and bid (started after HD) on HD days.   Loralie Champagne 03/25/2021 11:02 AM

## 2021-03-25 NOTE — Procedures (Signed)
   I was present at this dialysis session, have reviewed the session itself and made  appropriate changes Kelly Splinter MD [place] pager [phone]   03/25/2021, 10:43 AM

## 2021-03-25 NOTE — TOC Progression Note (Signed)
Transition of Care (TOC) - Progression Note  Heart Failure   Patient Details  Name: Tiffany [name] MRN: [id number] Date of Birth: Mar 14, 1946  Transition of Care [facility]) CM/SW [place], [place] Phone Number: 03/25/2021, 5:20 PM  Clinical Narrative:    CSW spoke with the patient at bedside and completed a very brief SDOH with the patient who denied having any needs at this time. Patient reported they do have a PCP and they can get to the pharmacy to pick up their medications and that her husband provides transportation to appointments and to dialysis. CSW provided the patient with the social workers name and position and if anything changes to please reach out so that CSW can provide support.  CSW will continue to follow throughout discharge.  Expected Discharge Plan: Home/Self Care Barriers to Discharge: Continued Medical Work up  Expected Discharge Plan and Services Expected Discharge Plan: Home/Self Care In-house Referral: Clinical Social Work Discharge Planning Services: CM Consult Post Acute Care Choice: [place] arrangements for the past 2 months: Single Family Home                                       Social Determinants of Health (SDOH) Interventions Food Insecurity Interventions: Intervention Not Indicated Financial Strain Interventions: Intervention Not Indicated Housing Interventions: Intervention Not Indicated Transportation Interventions: Intervention Not Indicated  Readmission Risk Interventions No flowsheet data found.  Diamonique Ruedas, MSW, [place] Heart Failure Social Worker

## 2021-03-26 ENCOUNTER — Inpatient Hospital Stay (HOSPITAL_COMMUNITY): Payer: Medicare PPO | Admitting: Anesthesiology

## 2021-03-26 ENCOUNTER — Inpatient Hospital Stay (HOSPITAL_COMMUNITY): Payer: Medicare PPO

## 2021-03-26 ENCOUNTER — Ambulatory Visit: Payer: Medicare PPO | Admitting: Internal Medicine

## 2021-03-26 DIAGNOSIS — I50810 Right heart failure, unspecified: Secondary | ICD-10-CM | POA: Diagnosis not present

## 2021-03-26 DIAGNOSIS — J9601 Acute respiratory failure with hypoxia: Secondary | ICD-10-CM

## 2021-03-26 DIAGNOSIS — I270 Primary pulmonary hypertension: Secondary | ICD-10-CM

## 2021-03-26 DIAGNOSIS — E8779 Other fluid overload: Secondary | ICD-10-CM | POA: Diagnosis not present

## 2021-03-26 DIAGNOSIS — R590 Localized enlarged lymph nodes: Secondary | ICD-10-CM

## 2021-03-26 DIAGNOSIS — N186 End stage renal disease: Secondary | ICD-10-CM | POA: Diagnosis not present

## 2021-03-26 LAB — IRON AND TIBC
Iron: 76 ug/dL (ref 28–170)
Saturation Ratios: 36 % — ABNORMAL HIGH (ref 10.4–31.8)
TIBC: 209 ug/dL — ABNORMAL LOW (ref 250–450)
UIBC: 133 ug/dL

## 2021-03-26 LAB — VITAMIN B12: Vitamin B-12: 957 pg/mL — ABNORMAL HIGH (ref 180–914)

## 2021-03-26 LAB — CBC
HCT: 25 % — ABNORMAL LOW (ref 36.0–46.0)
Hemoglobin: 7.9 g/dL — ABNORMAL LOW (ref 12.0–15.0)
MCH: 29.3 pg (ref 26.0–34.0)
MCHC: 31.6 g/dL (ref 30.0–36.0)
MCV: 92.6 fL (ref 80.0–100.0)
Platelets: 160 10*3/uL (ref 150–400)
RBC: 2.7 MIL/uL — ABNORMAL LOW (ref 3.87–5.11)
RDW: 21.2 % — ABNORMAL HIGH (ref 11.5–15.5)
WBC: 4.7 10*3/uL (ref 4.0–10.5)
nRBC: 0 % (ref 0.0–0.2)

## 2021-03-26 LAB — BLOOD GAS, ARTERIAL
Acid-base deficit: 1.4 mmol/L (ref 0.0–2.0)
Bicarbonate: 21.3 mmol/L (ref 20.0–28.0)
Drawn by: 53527
FIO2: 100
O2 Saturation: 98.9 %
Patient temperature: 36.6
pCO2 arterial: 26.3 mmHg — ABNORMAL LOW (ref 32.0–48.0)
pH, Arterial: 7.517 — ABNORMAL HIGH (ref 7.350–7.450)
pO2, Arterial: 264 mmHg — ABNORMAL HIGH (ref 83.0–108.0)

## 2021-03-26 LAB — BASIC METABOLIC PANEL
Anion gap: 13 (ref 5–15)
BUN: 31 mg/dL — ABNORMAL HIGH (ref 8–23)
CO2: 25 mmol/L (ref 22–32)
Calcium: 8.3 mg/dL — ABNORMAL LOW (ref 8.9–10.3)
Chloride: 95 mmol/L — ABNORMAL LOW (ref 98–111)
Creatinine, Ser: 4.26 mg/dL — ABNORMAL HIGH (ref 0.44–1.00)
GFR, Estimated: 10 mL/min — ABNORMAL LOW (ref >60)
Glucose, Bld: 105 mg/dL — ABNORMAL HIGH (ref 70–99)
Potassium: 3.6 mmol/L (ref 3.5–5.1)
Sodium: 133 mmol/L — ABNORMAL LOW (ref 135–145)

## 2021-03-26 LAB — FOLATE: Folate: 8.1 ng/mL (ref >5.9)

## 2021-03-26 LAB — FERRITIN: Ferritin: >7500 ng/mL — ABNORMAL HIGH (ref 11–307)

## 2021-03-26 LAB — RETICULOCYTES
Immature Retic Fract: 20.2 % — ABNORMAL HIGH (ref 2.3–15.9)
RBC.: 2.69 MIL/uL — ABNORMAL LOW (ref 3.87–5.11)
Retic Count, Absolute: 52.7 10*3/uL (ref 19.0–186.0)
Retic Ct Pct: 2 % (ref 0.4–3.1)

## 2021-03-26 LAB — HEPATITIS B SURFACE ANTIBODY, QUANTITATIVE: Hep B S AB Quant (Post): <3.1 m[IU]/mL — ABNORMAL LOW (ref >9.9)

## 2021-03-26 MED ORDER — FENTANYL CITRATE (PF) 100 MCG/2ML IJ SOLN
INTRAMUSCULAR | Status: AC
  Filled 2021-03-26: qty 2

## 2021-03-26 MED ORDER — VASOPRESSIN 20 UNITS/100 ML INFUSION FOR SHOCK: 0.0000 [IU]/min | INTRAVENOUS | Status: DC

## 2021-03-26 MED ORDER — SODIUM CHLORIDE 0.9 % IV BOLUS: 250.0000 mL | Freq: Once | INTRAVENOUS | Status: DC

## 2021-03-26 MED ORDER — ETOMIDATE 2 MG/ML IV SOLN
INTRAVENOUS | Status: DC | PRN
Start: 2021-03-26 — End: 2021-03-26
  Administered 2021-03-26: 8 mg via INTRAVENOUS

## 2021-03-26 MED ORDER — LIDOCAINE-PRILOCAINE 2.5-2.5 % EX CREA
1.0000 "application " | TOPICAL_CREAM | CUTANEOUS | Status: DC | PRN
  Filled 2021-03-26: qty 5

## 2021-03-26 MED ORDER — PANTOPRAZOLE SODIUM 40 MG IV SOLR: 40.0000 mg | INTRAVENOUS | Status: DC

## 2021-03-26 MED ORDER — SODIUM CHLORIDE 0.9 % IV SOLN: 100.0000 mL | INTRAVENOUS | Status: DC | PRN

## 2021-03-26 MED ORDER — HEPARIN SODIUM (PORCINE) 1000 UNIT/ML IJ SOLN
INTRAMUSCULAR | Status: AC
  Filled 2021-03-26: qty 1

## 2021-03-26 MED ORDER — PHENYLEPHRINE HCL (PRESSORS) 10 MG/ML IV SOLN
INTRAVENOUS | Status: DC | PRN
  Administered 2021-03-26: 120 ug via INTRAVENOUS

## 2021-03-26 MED ORDER — ALTEPLASE 2 MG IJ SOLR: 2.0000 mg | Freq: Once | INTRAMUSCULAR | Status: DC | PRN

## 2021-03-26 MED ORDER — ALBUMIN HUMAN 25 % IV SOLN
INTRAVENOUS | Status: AC
  Administered 2021-03-26: 25 g
  Filled 2021-03-26: qty 100

## 2021-03-26 MED ORDER — MIDODRINE HCL 5 MG PO TABS
10.0000 mg | ORAL_TABLET | ORAL | Status: DC
Start: 2021-03-26 — End: 2021-03-27
  Administered 2021-03-26 (×2): 10 mg via ORAL
  Filled 2021-03-26 (×2): qty 2

## 2021-03-26 MED ORDER — ROCURONIUM BROMIDE 10 MG/ML (PF) SYRINGE
PREFILLED_SYRINGE | INTRAVENOUS | Status: AC
  Filled 2021-03-26: qty 10

## 2021-03-26 MED ORDER — HEPARIN SODIUM (PORCINE) 1000 UNIT/ML DIALYSIS: 1000.0000 [IU] | INTRAMUSCULAR | Status: DC | PRN

## 2021-03-26 MED ORDER — NOREPINEPHRINE 4 MG/250ML-% IV SOLN
2.0000 ug/min | INTRAVENOUS | Status: DC
Start: 2021-03-26 — End: 2021-03-27
  Administered 2021-03-26: 10 ug/min via INTRAVENOUS
  Filled 2021-03-26: qty 250

## 2021-03-26 MED ORDER — PENTAFLUOROPROP-TETRAFLUOROETH EX AERO: 1.0000 "application " | INHALATION_SPRAY | CUTANEOUS | Status: DC | PRN

## 2021-03-26 MED ORDER — MIDAZOLAM HCL 2 MG/2ML IJ SOLN
INTRAMUSCULAR | Status: AC
  Filled 2021-03-26: qty 2

## 2021-03-26 MED ORDER — LIDOCAINE HCL (PF) 1 % IJ SOLN: 5.0000 mL | INTRAMUSCULAR | Status: DC | PRN

## 2021-03-26 MED ORDER — SUCCINYLCHOLINE CHLORIDE 200 MG/10ML IV SOSY
PREFILLED_SYRINGE | INTRAVENOUS | Status: DC | PRN
Start: 2021-03-26 — End: 2021-03-26
  Administered 2021-03-26: 40 mg via INTRAVENOUS

## 2021-03-26 MED ORDER — NOREPINEPHRINE 4 MG/250ML-% IV SOLN
0.0000 ug/min | INTRAVENOUS | Status: DC
Start: 2021-03-26 — End: 2021-03-27

## 2021-03-26 MED ORDER — SODIUM CHLORIDE 0.9 % IV BOLUS: 500.0000 mL | Freq: Once | INTRAVENOUS | Status: DC

## 2021-03-26 MED ORDER — ETOMIDATE 2 MG/ML IV SOLN
INTRAVENOUS | Status: AC
  Filled 2021-03-26: qty 20

## 2021-03-26 MED ORDER — MIDODRINE HCL 5 MG PO TABS
ORAL_TABLET | ORAL | Status: AC
  Administered 2021-03-26: 10 mg via ORAL
  Filled 2021-03-26: qty 2

## 2021-03-26 MED ORDER — SODIUM CHLORIDE 0.9 % IV SOLN
250.0000 mL | INTRAVENOUS | Status: DC
Start: 2021-03-26 — End: 2021-03-27

## 2021-04-01 MED FILL — Medication: Qty: 2 | Status: AC

## 2021-04-03 NOTE — Progress Notes (Signed)
This RN and RN Terie Purser pronounced TOD at 11/09/34 on April 15, 2021.  No palpable pulses, were felt and no apical heart sounds or breath sounds were auscultated over a period of 1 minute in all fields.  All pertinent information gathered respectfully from family and valuables returned to husband.  Will clean pt and prepare for transfer to [facility].

## 2021-04-03 NOTE — Progress Notes (Signed)
Late Entry Patient called out for this RN. Observed the patient face was looking discolored. Patient states was not was not feeling well and wanted to sit upright. Rinsed back patient and helped her sit upright, obtained non rebreather.Alerted Renal PA that treatment has been stopped. Contacted rapid response then called respiratory to assist with evaluating patient. Patient departed HD unit alert oriented BP 76/35 pulse 70.

## 2021-04-03 NOTE — Progress Notes (Signed)
Chaplain offered support to pt's husband and daughter in hallway at time of code.  Followed up w/ family when pt moved.  Chaplain did not engage at that time as family at bedside w wife/mother in private sacred time.  Chaplain read scripture and prayed silently from hallway.  Chaplain stands ready for return visit if needed.  [place]

## 2021-04-03 NOTE — Consult Note (Signed)
NAME:  Tiffany [name], MRN:  [id number], DOB:  [birth year], LOS: 1 ADMISSION DATE:  03/22/2021, CONSULTATION DATE: 8/24 REFERRING MD: Dr. Florene Glen, CHIEF COMPLAINT: Hypotension  History of Present Illness:  75 year old female with past medical history as below, which is significant for pulmonary hypertension, congestive heart failure, paroxysmal atrial fibrillation on eliquis, ESRD on HD (requires midodrine for hypotension), Heart failure with preserved ejection fraction.  Most recent right heart cath in January of this year showed normal filling pressures but severe pulmonary hypertension.  Echo at that time with left ventricular ejection fraction of 55 to 60%.  She presented [facility] emergency department 8/22 with complaints of neck pain causing her to miss dialysis.  In the emergency department she was found to be volume overloaded.  She was hypoxic requiring 5 L of from her home baseline of 3 L of oxygen.  She was admitted for hemodialysis.  Her initial episode of HD was on 8/23 during which 2.5 L was removed.  She had dialysis again on 8/24 during which she had some issues with hypotension and systolic blood pressures have been in the 70s requiring initiation of norepinephrine. Also become hypoxemic requiring additional supplemental O2.   PCCM was consulted for evaluation.  On arrival to room, patient was cyanotic, hypoxic on bipap.  Anesthesia was called for emergent intubation.  Patient intubated per anesthesia. Levophed was initiated.  After intubation, the patient suffered a bradycardic arrest requiring ACLS.  See code sheet for details.  ROSC was achieved and patient transferred to ICU.  After arrival to ICU, she suffered a second arrest.     Pertinent  Medical History   has a past medical history of Anemia, Arthritis (04/20/2012), Atrial fibrillation status post cardioversion, 01/30/15 maintaining SR.  (01/31/2015), BENIGN POSITIONAL VERTIGO, Cataract, CHF (congestive heart failure) ([place]),  Chronic kidney disease, Complication of anesthesia (04/20/2012), Diverticulosis, DYSLIPIDEMIA, Dyspnea, Dysrhythmia, Esophageal stricture, GERD, HYPERTENSION, Hypothyroidism (09/29/2014), Mild LV dysfunction, Osteopenia, Pneumonia, Pulmonary hypertension ([place]), Raynaud disease, Sleep apnea, Umbilical hernia, and URINARY INCONTINENCE (04/20/2012).   Significant Hospital Events: Including procedures, antibiotic start and stop dates in addition to other pertinent events   8/22 Admit  8/24 PCCM consult for respiratory distress   Interim History / Subjective:  As above   Objective   Blood pressure (!) (P) 76/35, pulse (P) 70, temperature (P) 98 F (36.7 C), temperature source (P) Axillary, resp. rate 14, height '4\' 11"'$  (1.499 m), weight 54 kg, SpO2 90 %.       No intake or output data in the 24 hours ending 04/07/2021 1816 Filed Weights   03/25/2021 1506 03/25/21 0850 03/25/21 1240  Weight: 54 kg 56.5 kg 54 kg    Examination: General: frail elderly adult female lying in bed on bipap, cyanotic  HENT: MM cyanotic, dry, anicteric, no jvd Lungs: clear bilaterally anterior Cardiovascular: s1s2 regular pre-arrest Abdomen: soft, protuberant, bsx4 hypoactive  Extremities: cool/dry, cyanotic Neuro: initially responsive, appropriate    CTA chest > No PE, Evidence of pulmonary hypertension. Mediastinal and hilar lymphadenopathy. Trace pleural effusion on the R.   CT C spine >Degenerative disc disease.   Resolved Hospital Problem list     Assessment & Plan:    AoC hypoxic respiratory failure (on 3L O2 chronically) - unfortunately deteriorated 8/24 PM and required intubation. - PRVC 8cc/kg - Wean PEEP / fiO2 for sats >90% - Check ABG - SBT / WUA when appropriate  - Bronchial hygiene - Follow CXR - PAD protocol for RASS goal 0 to -  1 / minimize as able to allow for neuro exam   Cardiac Arrest  Suspect respiratory leading to cardiac arrest  -await post code neuro exam  -consider  normothermia protocol pending post arrest neuro exam    PAH (followed by Dr. 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 - Continue management per Dr. [name].   Hx OSA on CPAP - Continue nocturnal CPAP   Hypotension, chronic (on midodrine '10mg'$  QID) - likely exacerbated by volume contraction after HD.  - Would add back some fluids, additional saline bolus - Continue Midodrine. - Continue Levophed for now given that it is already on, wean to off as able. - hold lopressor   Hx PAF. - Continue Flecainide, low dose Toprol-XL, Apixaban   ESRD on HD MTThF. - Nephrology following. - Might need CRRT depending on BP response / pattern.  Best Practice (right click and "Reselect all SmartList Selections" daily)   Diet/type: NPO DVT prophylaxis: DOAC GI prophylaxis: PPI Lines: Dialysis Catheter Foley:  N/A Code Status:  full code Last date of multidisciplinary goals of care discussion- full code per prior discussion  Labs   CBC: Recent Labs  Lab 03/23/2021 1602 03/25/21 0217 03/30/2021 0135  WBC 4.5 5.2 4.7  NEUTROABS 3.9  --   --   HGB 8.3* 7.8* 7.9*  HCT 26.7* 24.9* 25.0*  MCV 92.7 92.6 92.6  PLT 167 170 0000000    Basic Metabolic Panel: Recent Labs  Lab 03/03/2021 1602 03/25/21 0217 2021/03/30 0135  NA 133* 133* 133*  K 3.1* 3.5 3.6  CL 92* 94* 95*  CO2 '27 26 25  '$ GLUCOSE 95 101* 105*  BUN 45* 45* 31*  CREATININE 5.74* 6.27* 4.26*  CALCIUM 9.1 9.0 8.3*  MG 2.0  --   --   PHOS  --  5.0*  --    GFR: Estimated Creatinine Clearance: 8.4 mL/min (A) (by C-G formula based on SCr of 4.26 mg/dL (H)). Recent Labs  Lab 03/05/2021 1602 03/25/21 0217 Mar 30, 2021 0135  WBC 4.5 5.2 4.7    Liver Function Tests: Recent Labs  Lab 03/25/21 0217  AST 13*  ALT 8  ALKPHOS 126  BILITOT 1.0  PROT 6.0*  ALBUMIN 2.8*   No results for input(s): LIPASE, AMYLASE in the last 168 hours. No results for input(s): AMMONIA in the last 168 hours.  ABG    Component  Value Date/Time   PHART 7.517 (H) 2021-03-30 1753   PCO2ART 26.3 (L) 2021/03/30 1753   PO2ART 264 (H) 2021/03/30 1753   HCO3 21.3 2021-03-30 1753   TCO2 31 02/14/2021 0822   ACIDBASEDEF 1.4 03/30/21 1753   O2SAT 98.9 03/30/21 1753     Coagulation Profile: No results for input(s): INR, PROTIME in the last 168 hours.  Cardiac Enzymes: No results for input(s): CKTOTAL, CKMB, CKMBINDEX, TROPONINI in the last 168 hours.  HbA1C: Hgb A1c MFr Bld  Date/Time Value Ref Range Status  04/19/2019 12:29 PM 5.2 4.6 - 6.5 % Final    Comment:    Glycemic Control Guidelines for People with Diabetes:Non Diabetic:  <6%Goal of Therapy: <7%Additional Action Suggested:  >8%   10/08/2017 11:04 AM 5.2 4.6 - 6.5 % Final    Comment:    Glycemic Control Guidelines for People with Diabetes:Non Diabetic:  <6%Goal of Therapy: <7%Additional Action Suggested:  >8%     CBG: No results for input(s): GLUCAP in the last 168 hours.  Review of Systems:   Unable to complete as patient  is obtunded on mechanical ventilation   Past Medical History:  She,  has a past medical history of Anemia, Arthritis (04/20/2012), Atrial fibrillation status post cardioversion, 01/30/15 maintaining SR.  (01/31/2015), BENIGN POSITIONAL VERTIGO, Cataract, CHF (congestive heart failure) ([place]), Chronic kidney disease, Complication of anesthesia (04/20/2012), Diverticulosis, DYSLIPIDEMIA, Dyspnea, Dysrhythmia, Esophageal stricture, GERD, HYPERTENSION, Hypothyroidism (09/29/2014), Mild LV dysfunction, Osteopenia, Pneumonia, Pulmonary hypertension ([place]), Raynaud disease, Sleep apnea, Umbilical hernia, and URINARY INCONTINENCE (04/20/2012).   Surgical History:   Past Surgical History:  Procedure Laterality Date   ATRIAL FIBRILLATION ABLATION N/A 01/26/2017   Procedure: Atrial Fibrillation Ablation;  Surgeon: Thompson Grayer, MD;  Location: [facility];  Service: Cardiovascular;  Laterality: N/A;   BASCILIC VEIN TRANSPOSITION Left  12/23/2020   Procedure: LEFT FIRST STAGE [place];  Surgeon: [name], MD;  Location: [facility];  Service: Vascular;  Laterality: Left;   [place] Left 02/14/2021   Procedure: LEFT ARM SECOND STAGE BASILIC VEIN TRANSPOSITION;  Surgeon: [name], MD;  Location: [facility];  Service: Vascular;  Laterality: Left;   breast ultrasound Right 09/13/2013   There is no sonographic evidence of malignancy. the [id number] complicated cyst in (R) breast is consistent with a benign finding. repeat in 1 year   CARDIAC CATHETERIZATION N/A 08/29/2015   Procedure: Right Heart Cath;  Surgeon: Larey Dresser, MD;  Location: [facility];  Service: Cardiovascular;  Laterality: N/A;   CARDIOVERSION N/A 01/30/2015   Procedure: CARDIOVERSION;  Surgeon: Sanda Klein, MD;  Location: [facility];  Service: Cardiovascular;  Laterality: N/A;   CATARACT EXTRACTION, BILATERAL     CHOLECYSTECTOMY     '90-"sludge"   EYE SURGERY     JOINT REPLACEMENT     NASAL FRACTURE SURGERY  2006   RIGHT HEART CATH N/A 06/30/2017   Procedure: RIGHT HEART CATH;  Surgeon: Larey Dresser, MD;  Location: [facility];  Service: Cardiovascular;  Laterality: N/A;   RIGHT HEART CATH N/A 08/09/2020   Procedure: RIGHT HEART CATH;  Surgeon: Larey Dresser, MD;  Location: [facility];  Service: Cardiovascular;  Laterality: N/A;   TEE WITHOUT CARDIOVERSION N/A 01/30/2015   Procedure: TRANSESOPHAGEAL ECHOCARDIOGRAM (TEE);  Surgeon: Sanda Klein, MD;  [facility];  Service: Cardiovascular;  Laterality: N/A;   TEE WITHOUT CARDIOVERSION N/A 01/25/2017   Procedure: TRANSESOPHAGEAL ECHOCARDIOGRAM (TEE);  Surgeon: Fay Records, MD;  Location: [facility] ENDOSCOPY;  Service: Cardiovascular;  Laterality: N/A;   TOTAL HIP ARTHROPLASTY  04/26/2012   Procedure: TOTAL HIP ARTHROPLASTY ANTERIOR APPROACH;  Surgeon: Mauri Pole, MD;  Location: [facility];  Service: Orthopedics;   Laterality: Right;   TUBAL LIGATION  1980     Social History:   reports that she has never smoked. She has never used smokeless tobacco. She reports current alcohol use. She reports that she does not use drugs.   Family History:  Her family history includes Other in an other family member. She was adopted.   Allergies Allergies  Allergen Reactions   Levothyroxine Shortness Of Breath and Other (See Comments)    Exhaustion also - Per pt her this medication could have not been the reason for her symptoms   Statins Other (See Comments)    "Pain, weakness and kidney problems"   Prednisone Other (See Comments)    Severe insomnia   Penicillins Other (See Comments)    dry mouth Has patient had a PCN reaction causing immediate rash, facial/tongue/throat swelling, SOB or lightheadedness  with hypotension: No Has patient had a PCN reaction causing severe rash involving mucus membranes or skin necrosis: No Has patient had a PCN reaction that required hospitalization No Has patient had a PCN reaction occurring within the last 10 years: No If all of the above answers are "NO", then may proceed with Cephalosporin use.      Home Medications  Prior to Admission medications   Medication Sig Start Date End Date Taking? Authorizing Provider  acetaminophen (TYLENOL) 325 MG tablet Take 2 tablets (650 mg total) by mouth every 4 (four) hours as needed for headache or mild pain. 01/29/15  Yes Kilroy, Luke K, PA-C  ELIQUIS 5 MG TABS tablet TAKE 1 TABLET BY MOUTH TWICE A DAY Patient taking differently: Take 5 mg by mouth 2 (two) times daily. 08/20/20  Yes Troy Sine, MD  famotidine (PEPCID) 20 MG tablet TAKE 1 TABLET BY MOUTH TWICE A DAY Patient taking differently: Take 20 mg by mouth 2 (two) times daily. 06/26/20  Yes Larey Dresser, MD  flecainide (TAMBOCOR) 50 MG tablet Take 1 tablet (50 mg total) by mouth 2 (two) times daily. 03/03/21  Yes Fenton, Clint R, PA  macitentan (OPSUMIT) 10 MG tablet Take  1 tablet (10 mg total) by mouth daily. 08/29/20  Yes Larey Dresser, MD  Melatonin 2.5 MG CHEW Chew 1 tablet by mouth daily as needed (For sleep).   Yes [provider]  midodrine (PROAMATINE) 5 MG tablet Take 5 mg by mouth QID. Takes 4x/day, 3 hours apart.   Yes [provider]  Polyethyl Glycol-Propyl Glycol (SYSTANE OP) Place 1 drop into both eyes daily as needed (For dry eyes).   Yes [provider]  Probiotic Product (PROBIOTIC-10) CAPS Take 1 capsule by mouth every Monday, Wednesday, and Friday.   Yes [provider]  Riociguat (ADEMPAS) 2.5 MG TABS Take 2.5 mg by mouth 2 (two) times daily.   Yes [provider]  Selexipag (UPTRAVI) 1200 MCG TABS Take 2 tablets by mouth in the morning and at bedtime. Patient taking differently: Take 2,400 mcg by mouth in the morning and at bedtime. 12/27/20  Yes Larey Dresser, MD  senna (SENOKOT) 8.6 MG TABS tablet Take 1 tablet by mouth daily as needed for mild constipation.   Yes [provider]  fluticasone (FLONASE) 50 MCG/ACT nasal spray Place 1 spray daily as needed into both nostrils for allergies or rhinitis. Patient not taking: Reported on 03/25/2021    [provider]     Critical care time: 19 minutes     Noe Gens, MSN, APRN, NP-C, AGACNP-BC [facility] 04/03/2021, 6:59 PM   Please see [email or web address] for pager details.   From 7A-7P if no response, please call [phone] After hours, please call ELink [phone]

## 2021-04-03 NOTE — Progress Notes (Signed)
Family at bedside, updated per Dr. Ander Slade.  No further CPR if arrests.  Support offered.     Noe Gens, MSN, APRN, NP-C, AGACNP-BC [facility] 04-05-21, 7:05 PM   Please see [email or web address] for pager details.   From 7A-7P if no response, please call [phone] After hours, please call ELink [phone]

## 2021-04-03 NOTE — Progress Notes (Signed)
Subjective: Said tolerated 2.5 L UF yesterday, breathing better, still has excess volume , as noted Dr. Algernon Huxley increased midodrine 10 mg 4 times a day and will have plans for dialysis today(as outpatient she is on HD 4 days a week at [facility]) and noted if stable discharge after dialysis later today.  Next dialysis Thursday and Friday  Objective Vital signs in last 24 hours: Vitals:   03/25/21 1305 03/25/21 2026 2021/04/09 0038 04-09-2021 0411  BP: (!) 85/54 (!) 98/57 (!) 91/49 91/60  Pulse: 70 76 87 76  Resp: 18 19 (!) 27 19  Temp: 97.8 F (36.6 C) 99.4 F (37.4 C)  98.2 F (36.8 C)  TempSrc:  Oral  Oral  SpO2: (!) 89% 91% 91% 90%  Weight:      Height:       Weight change: 2.522 kg  Physical Exam: General: Alert chronically ill elderly female talkative pleasant NAD on nasal cannula oxygen Heart: RRR, no MRG Lungs: Breath sounds decreased at bases otherwise CTA nonlabored breathing on nasal cannula oxygen Abdomen: Bowel sounds NABS, soft, NTND, no abdominal wall edema Extremities: Bilateral lower extremity edema 1+  Dialysis Access: IJ PermCath, LUA aVF positive bruit   [facility] HD: [place]  [facility]   3h  2/2 bath   55kg  250/500   LUA AVF maturing/ RIJ TDC  - mircera 60ug q 2, due now  - venofer 50 weekly     Problem/Plan: SOB/ pulm edema - due to vol overload. Not in distress.  HD 2.5 L post weight below EDW 54 kg should have lower dry weight at discharge plan HD today then tomorrow following a regular schedule Thursday and Friday, noted midodrine 10 mg increased 4 times daily by Dr. [name], use albumin today to help with UF ESRD - recent starts 2 mos ago. 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 Is on HD 4x per week at OP unit. Missed HD 8/22, dialysis yesterday, then today then Thursday Friday keep on schedule PAH - severe, on multiple meds for this/Dr. Algernon Huxley follows PAF on eliquis, flecainide Diast CHF - severe, f/b Dr [name] Hypotension - on midodrine 5qid, noted Dr. 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, corrected calcium under 10 phosphorus 5.0, currently no binder, follow-up trend for now  Ernest Haber, PA-C [place] [phone] 2021-04-09,9:46 AM  LOS: 1 day   Labs: Basic Metabolic Panel: Recent Labs  Lab 03/07/2021 1602 03/25/21 0217 09-Apr-2021 0135  NA 133* 133* 133*  K 3.1* 3.5 3.6  CL 92* 94* 95*  CO2 '27 26 25  '$ GLUCOSE 95 101* 105*  BUN 45* 45* 31*  CREATININE 5.74* 6.27* 4.26*  CALCIUM 9.1 9.0 8.3*  PHOS  --  5.0*  --    Liver Function Tests: Recent Labs  Lab 03/25/21 0217  AST 13*  ALT 8  ALKPHOS 126  BILITOT 1.0  PROT 6.0*  ALBUMIN 2.8*   No results for input(s): LIPASE, AMYLASE in the last 168 hours. No results for input(s): AMMONIA in the last 168 hours. CBC: Recent Labs  Lab 03/07/2021 1602 03/25/21 0217 09-Apr-2021 0135  WBC 4.5 5.2 4.7  NEUTROABS 3.9  --   --   HGB 8.3* 7.8* 7.9*  HCT 26.7* 24.9* 25.0*  MCV 92.7 92.6 92.6  PLT 167 170 160   Cardiac Enzymes: No results for input(s): CKTOTAL, CKMB, CKMBINDEX, TROPONINI in  the last 168 hours. CBG: No results for input(s): GLUCAP in the last 168 hours.  Studies/Results: CT Angio Chest PE W and/or Wo Contrast  Result Date: 04/02/2021 CLINICAL DATA:  Right-sided neck pain for 8 weeks EXAM: CT ANGIOGRAPHY CHEST WITH CONTRAST TECHNIQUE: Multidetector CT imaging of the chest was performed using the standard protocol during bolus administration of intravenous contrast. Multiplanar CT image reconstructions and MIPs were obtained to evaluate the vascular anatomy. CONTRAST:  52m OMNIPAQUE IOHEXOL 350 MG/ML SOLN COMPARISON:  Same day chest radiograph, CT chest 06/21/2017 FINDINGS: Cardiovascular: Satisfactory opacification of the pulmonary arteries to the segmental level. No evidence of pulmonary embolism. There is cardiomegaly with particular enlargement of the right heart. The main  pulmonary artery is enlarged at 3.4 cm. There is reflux of contrast into the IVC and hepatic veins. A right internal jugular dialysis catheter is in place terminating in the right atrium. There is calcified atherosclerotic plaque of the aortic arch. Mediastinum/Nodes: There are prominent mediastinal lymph nodes measuring up to 1.4 cm. There is right hilar lymphadenopathy with a conglomerate measuring up to approximately 2.2 cm x 1.7 cm (4-100). There are enlarged left hilar lymph nodes measuring up to 1.2 cm. These lymph nodes are overall increased in size compared to the CT chest of 06/21/2017. Lungs/Pleura: The trachea and central airways are patent. There is a background of moderate emphysema. There is masslike consolidation in the superior segment of the right lower lobe measuring 2.3 cm by 1.4 cm. There is a calcified granuloma in the right middle lobe. There is a trace right pleural effusion with adjacent relaxation atelectasis. Otherwise, there is no focal consolidation. There is no left pleural effusion. There is no pneumothorax. Upper Abdomen: The imaged portions of the upper abdominal viscera are unremarkable. Musculoskeletal: There is degenerative change in the lower cervical spine. There is no acute osseous abnormality. A fatty lesion in the right breast is unchanged, consistent with a lipoma. Review of the MIP images confirms the above findings. IMPRESSION: 1. No evidence of acute pulmonary embolus. 2. Cardiomegaly with evidence of right heart failure as above. Enlargement of the pulmonary artery also suggests pulmonary hypertension. 3. Bulky mediastinal and hilar lymphadenopathy and a 2.3 cm x 1.4 cm may be due to heart failure. Recommend short follow up chest CT in 3 months to reassess. Masslike consolidation in the right lower lobe can also be reassessed at this time. 4. Trace right pleural effusion. 5. Aortic Atherosclerosis (ICD10-I70.0) and Emphysema (ICD10-J43.9). Electronically Signed   By:  PValetta MoleM.D.   On: 03/31/2021 18:05   CT CERVICAL SPINE WO CONTRAST  Result Date: 03/25/2021 CLINICAL DATA:  Neck pain. EXAM: CT CERVICAL SPINE WITHOUT CONTRAST TECHNIQUE: Multidetector CT imaging of the cervical spine was performed without intravenous contrast. Multiplanar CT image reconstructions were also generated. COMPARISON:  None. FINDINGS: Alignment: Minimal grade 1 anterolisthesis of C3-4 and C4-5 is noted secondary to posterior facet joint hypertrophy. Skull base and vertebrae: No acute fracture. No primary bone lesion or focal pathologic process. Soft tissues and spinal canal: No prevertebral fluid or swelling. No visible canal hematoma. Disc levels: Moderate degenerative disc disease is noted at C5-6 and C6-7. Upper chest: Negative. Other: Degenerative changes are seen involving the right-sided posterior facet joints. IMPRESSION: Multilevel degenerative disc disease. No acute abnormality seen in the cervical spine. Electronically Signed   By: JMarijo ConceptionM.D.   On: 03/25/2021 14:55   DG Chest Port 1 View  Result Date: 03/14/2021 CLINICAL DATA:  Shortness of breath. EXAM: PORTABLE CHEST 1 VIEW COMPARISON:  09/01/2018 FINDINGS: A new right jugular catheter has been placed and terminates over the lower SVC. The cardiac silhouette is mildly enlarged. Aortic atherosclerosis is noted. There is central pulmonary vascular congestion. Widespread bilateral interstitial densities have mildly increased. Trace pleural effusions are questioned. No pneumothorax or acute osseous abnormality is identified. IMPRESSION: Mildly increased interstitial densities which may reflect mild edema. Electronically Signed   By: Logan Bores M.D.   On: 03/12/2021 16:47   ECHOCARDIOGRAM COMPLETE BUBBLE STUDY  Result Date: 03/25/2021    ECHOCARDIOGRAM REPORT   Patient Name:   Tiffany [name] Date of Exam: 03/25/2021 Medical Rec #:  [id number]      Height:       59.0 in Accession #:    [id number]     Weight:       119.0  lb Date of Birth:  [birth year]      BSA:          1.479 m Patient Age:    75 years       BP:           85/54 mmHg Patient Gender: F              HR:           81 bpm. Exam Location:  Inpatient Procedure: 2D Echo Indications:    pulmonary artery hypertension  History:        Patient has prior history of Echocardiogram examinations, most                 recent 08/27/2020. End stage renal disease, Arrythmias:Atrial                 Fibrillation; Risk Factors:Hypertension.  Sonographer:    Johny Chess RDCS Referring Phys: [id number] Bowers  1. Right ventricular systolic function is moderately reduced. The right ventricular size is severely enlarged. There is severely elevated pulmonary artery systolic pressure. The estimated right ventricular systolic pressure is 99991111 mmHg.  2. 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  3. Left ventricular ejection fraction, by estimation, is 60 to 65%. The left ventricle has normal function. The left ventricle has no regional wall motion abnormalities. There is mild asymmetric left ventricular hypertrophy of the septal segment. Left ventricular diastolic parameters are indeterminate. There is the interventricular septum is flattened in diastole ('D' shaped left ventricle), consistent with right ventricular volume overload and the interventricular septum is flattened in systole, consistent with right ventricular pressure overload.  4. Left atrial size was mildly dilated.  5. Right atrial size was severely dilated.  6. The mitral valve is degenerative. Trivial mitral valve regurgitation. No evidence of mitral stenosis. Moderate mitral annular calcification.  7. The aortic valve is abnormal. There is moderate calcification of the aortic valve. Aortic valve regurgitation is not visualized. Mild aortic valve stenosis. Aortic valve area, by VTI measures 1.53 cm. Aortic valve mean gradient measures  12.0 mmHg. Aortic valve Vmax measures 2.38 m/s.  8. The inferior vena cava is dilated in size with <50% respiratory variability, suggesting right atrial pressure of 15 mmHg.  9. Agitated saline contrast bubble study was negative, with no evidence of any interatrial shunt. FINDINGS  Left Ventricle: Left ventricular ejection fraction, by estimation, is 60 to 65%. The left ventricle has normal function. The left ventricle has no regional wall motion abnormalities. The left ventricular internal cavity size was  normal in size. There is  mild asymmetric left ventricular hypertrophy of the septal segment. The interventricular septum is flattened in diastole ('D' shaped left ventricle), consistent with right ventricular volume overload and the interventricular septum is flattened in systole, consistent with right ventricular pressure overload. Left ventricular diastolic function could not be evaluated due to abnormal septal motion. Left ventricular diastolic parameters are indeterminate. Right Ventricle: The right ventricular size is severely enlarged. No increase in right ventricular wall thickness. Right ventricular systolic function is moderately reduced. There is severely elevated pulmonary artery systolic pressure. The tricuspid regurgitant velocity is 4.48 m/s, and with an assumed right atrial pressure of 15 mmHg, the estimated right ventricular systolic pressure is 99991111 mmHg. Left Atrium: Left atrial size was mildly dilated. Right Atrium: Right atrial size was severely dilated. Pericardium: There is no evidence of pericardial effusion. Mitral Valve: The mitral valve is degenerative in appearance. Moderate mitral annular calcification. Trivial mitral valve regurgitation. No evidence of mitral valve stenosis. Tricuspid Valve: The tricuspid valve is normal in structure. 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 No evidence of tricuspid stenosis.  Aortic Valve: The aortic valve is abnormal. There is moderate calcification of the aortic valve. Aortic valve regurgitation is not visualized. Mild aortic stenosis is present. Aortic valve mean gradient measures 12.0 mmHg. Aortic valve peak gradient measures 22.7 mmHg. Aortic valve area, by VTI measures 1.53 cm. Pulmonic Valve: The pulmonic valve was normal in structure. Pulmonic valve regurgitation is mild. No evidence of pulmonic stenosis. Aorta: The aortic root is normal in size and structure. Venous: The inferior vena cava is dilated in size with less than 50% respiratory variability, suggesting right atrial pressure of 15 mmHg. IAS/Shunts: No atrial level shunt detected by color flow Doppler. Agitated saline contrast was given intravenously to evaluate for intracardiac shunting. Agitated saline contrast bubble study was negative, with no evidence of any interatrial shunt.  LEFT VENTRICLE PLAX 2D LVIDd:         3.60 cm  Diastology LVIDs:         1.60 cm  LV e' medial:    7.72 cm/s LV PW:         0.80 cm  LV E/e' medial:  11.2 LV IVS:        1.20 cm  LV e' lateral:   13.30 cm/s LVOT diam:     1.70 cm  LV E/e' lateral: 6.5 LV SV:         61 LV SV Index:   41 LVOT Area:     2.27 cm  RIGHT VENTRICLE             IVC RV S prime:     12.20 cm/s  IVC diam: 2.40 cm TAPSE (M-mode): 2.2 cm LEFT ATRIUM             Index       RIGHT ATRIUM           Index LA diam:        3.20 cm 2.16 cm/m  RA Area:     32.60 cm LA Vol (A2C):   65.9 ml 44.55 ml/m RA Volume:   127.00 ml 85.86 ml/m LA Vol (A4C):   36.9 ml 24.95 ml/m LA Biplane Vol: 50.9 ml 34.41 ml/m  AORTIC VALVE AV Area (Vmax):    1.35 cm AV Area (Vmean):   1.42 cm AV Area (VTI):     1.53 cm AV Vmax:  238.00 cm/s AV Vmean:          155.000 cm/s AV VTI:            0.396 m AV Peak Grad:      22.7 mmHg AV Mean Grad:      12.0 mmHg LVOT Vmax:         142.00 cm/s LVOT Vmean:        96.700 cm/s LVOT VTI:          0.267 m LVOT/AV VTI ratio: 0.67  AORTA Ao Root  diam: 2.80 cm Ao Asc diam:  3.10 cm MITRAL VALVE               TRICUSPID VALVE MV Area (PHT): 3.03 cm    TR Peak grad:   80.3 mmHg MV Decel Time: 250 msec    TR Vmax:        448.00 cm/s MV E velocity: 86.60 cm/s MV A velocity: 76.40 cm/s  SHUNTS MV E/A ratio:  1.13        Systemic VTI:  0.27 m                            Systemic Diam: 1.70 cm Cherlynn Kaiser MD Electronically signed by Cherlynn Kaiser MD Signature Date/Time: 03/25/2021/5:56:55 PM    Final    Medications:  Derrill Memo ON 03/27/2021] ferric gluconate (FERRLECIT) IVPB      apixaban  2.5 mg Oral BID   Chlorhexidine Gluconate Cloth  6 each Topical Q0600   darbepoetin (ARANESP) injection - DIALYSIS  60 mcg Intravenous Q Tue-HD   famotidine  20 mg Oral BID   feeding supplement (NEPRO CARB STEADY)  237 mL Oral BID BM   flecainide  50 mg Oral BID   macitentan  10 mg Oral Daily   metoprolol succinate  12.5 mg Oral QHS   midodrine  10 mg Oral 4 times per day   multivitamin  1 tablet Oral QHS   Riociguat  2.5 mg Oral BID   saccharomyces boulardii  250 mg Oral Q M,W,F   Selexipag  2,400 mcg Oral BID

## 2021-04-03 NOTE — Significant Event (Signed)
Rapid Response Event Note   Reason for Call :  Hypotension and shortness of breath   Initial Focused Assessment:  Patient is alert and oriented, she states that her breathing is "better" but not back to normal.  She is using accessory muscles and she is purple in her chest and finger tips.  BP 65/48  SR 66 RR 28  O2 sats difficult to obtain Patient is on NRB  Nephrology PA bedside  HD RN flushed catheter and we returned quickly to her room.  Called Dr Florene Glen who met [place] upon arrival back to her room.  Husband at bedside upon arrival to her room.  Interventions:  NS bolus ABG Levophed started via PIV at 42mcg  CCM at bedside: Dr Ander Slade  and Velna Hatchet NP Prepared to intubated.  Patient lost pulse Code Blue activated.  See CPR record Accessed HD cath during CPR event.  ROSC achieved, Levophed at 72mcg Transported to 2H14 Patient lost pulse again shortly after arrival CPR ROSC achieved, patient is now a DNR A few minutes later she expired.  Family at bedside during event.    Plan of Care:     Event Summary:   MD Notified: Dr Florene Glen Call Time: 4628 Arrival Time: 6381 End Time: 1910  Raliegh Ip, RN

## 2021-04-03 NOTE — Progress Notes (Signed)
Called back to pts room because she was desatting on 6 lpm nasal cannula. Patient placed on CPAP with previous setting of 13cm and 6 lpm bled in and sats returned to 90s.

## 2021-04-03 NOTE — TOC Initial Note (Signed)
Transition of Care [facility]) - Initial/Assessment Note    Patient Details  Name: Tiffany [name] MRN: [id number] Date of Birth: 28-Jul-1946  [facility] of [facility]) CM/SW Contact:    Erenest Rasher, RN Phone Number: [phone] 2021/03/28, 5:12 PM  Clinical Narrative:                 TOC CM spoke to pt and gave permission to speak to husband, pt agreeable to [facility]. Provided pt with choice list. Agreeable to [place] for [facility]. Contacted rep, Stacie with new referral. Contacted [employer] rep, Jermaine for [employer] with seat to be delivered to room.   Expected Discharge Plan: [place] Barriers to Discharge: Continued Medical Work up   Patient Goals and CMS Choice Patient states their goals for this hospitalization and ongoing recovery are:: patient and husband has agreed on [facility] CMS Medicare.gov Compare Post Acute Care list provided to:: Patient Represenative (must comment) Choice offered to / list presented to : Spouse  Expected Discharge Plan and Services Expected Discharge Plan: [place] In-house Referral: Clinical Social Work Discharge Planning Services: CM Consult Post Acute Care Choice: [place] arrangements for the past 2 months: [place]                   DME Agency: [employer] Date DME Agency Contacted: 2021/03/28 Time DME Agency Contacted: 1400 Representative spoke with at DME Agency: Melene Muller HH Arranged: RN, PT [facility] Agency: [place] Date [place]: 2021/03/28 Time [place]: 59 Representative spoke with at [place]: Freddi Starr  Prior Living Arrangements/Services Living arrangements for the past 2 months: Single Family Home Lives with:: Spouse Patient language and need for interpreter reviewed:: Yes Do you feel safe going back to the place where you live?: Yes      Need for Family Participation in Patient Care: Yes (Comment) Care giver support system  in place?: Yes (comment) Current home services: DME (wheelchair, RW, oxygen([employer]), CPAP) Criminal Activity/Legal Involvement Pertinent to Current Situation/Hospitalization: No - Comment as needed  Activities of Daily Living Home Assistive Devices/Equipment: Wheelchair    Permission Sought/Granted Permission sought to share information with : Case Manager, Family Supports, PCP Permission granted to share information with : Yes, Verbal Permission Granted  Share Information with NAME: Kaiyla [place]  Permission granted to share info w AGENCY: [place] granted to share info w Relationship: husband  Permission granted to share info w Contact Information: [phone]  Emotional Assessment Appearance:: Appears stated age Attitude/Demeanor/Rapport: Gracious Affect (typically observed): Accepting Orientation: : Oriented to Self, Oriented to Place, Oriented to  Time, Oriented to Situation   Psych Involvement: No (comment)  Admission diagnosis:  Volume overload [E87.70] Acute respiratory failure (HCC) [J96.00] Patient Active Problem List   Diagnosis Date Noted   Acute respiratory failure ([place]) 03/25/2021   Volume overload 03/05/2021   Mediastinal lymphadenopathy 03/03/2021   Allergy, unspecified, initial encounter 01/21/2021   Anaphylactic shock, unspecified, initial encounter 01/21/2021   Anemia in chronic kidney disease 01/21/2021   Benign paroxysmal vertigo, bilateral [id number]   Complication of vascular dialysis catheter 01/21/2021   End stage renal disease ([place]) 01/21/2021   Iron deficiency anemia, unspecified 01/21/2021   Obstructive sleep apnea 01/21/2021   Primary pulmonary hypertension ([place]) 01/21/2021   Secondary hyperparathyroidism of renal origin ([place]) 01/21/2021   Status post tubal ligation [id number]   Umbilical hernia without obstruction or gangrene 01/21/2021  CKD (chronic kidney disease) stage 5, GFR less than 15 ml/min (HCC) 12/10/2020   Chronic  kidney disease (CKD), stage IV (severe) ([place]) 10/15/2020   Thyrotoxicosis without thyroid storm 12/14/2019   Thyroid nodule 12/14/2019   Secondary hypercoagulable state ([place]) 11/27/2019   Dry mouth 05/03/2018   Presbycusis of both ears 05/03/2018   Mild LV dysfunction    Esophageal stricture    Trigger finger of right thumb 03/09/2017   Persistent atrial fibrillation ([place]) 01/26/2017   Routine general medical examination at a health care facility 10/03/2015   Chronic diastolic CHF (congestive heart failure) ([place]) 09/19/2015   Pulmonary hypertension ([place]) 09/19/2015   Paroxysmal atrial fibrillation (HCC)    Bradycardia    SOB (shortness of breath) 03/26/2015   Atrial fibrillation status post cardioversion, 01/30/15 maintaining SR.  01/31/2015   CREST (calcinosis, Raynaud's phenomenon, esophageal dysfunction, sclerodactyly, telangiectasia) ([place]) 01/29/2015   CKD (chronic kidney disease) stage 4, GFR 15-29 ml/min ([place]) 01/29/2015   Hypothyroidism 09/29/2014   Osteopenia    Arthritis 04/20/2012   Anxiety    Osteoarthritis of right hip 01/13/2010   Obesity-BMI 31 07/01/2009   GERD 07/01/2009   URINARY INCONTINENCE 07/01/2009   PCP:  [name], MD Pharmacy:   [id number] [place] [address]Phone: [phone]: [phone] [place] [place]: [phone]: [phone]    Social Determinants of Health (SDOH) Interventions Food Insecurity Interventions: Intervention Not Indicated Financial Strain Interventions: Intervention Not Indicated Housing Interventions: Intervention Not Indicated Transportation Interventions: Intervention Not Indicated  Readmission Risk Interventions No flowsheet data found.

## 2021-04-03 NOTE — Evaluation (Signed)
Physical Therapy Evaluation Patient Details Name: Tiffany [name] MRN: [id number] DOB: 04/09/1946 Today's Date: April 20, 2021   History of Present Illness  75 y.o. F admitted to [facility] on 03/25/21 due to volume overload/pulmonary edema with complaints of neck pain. Prior medical history significant for Afib, pHTN, arthritis, CHF, CKD on dialysis 4x per week, Raynaud Disease, and GERD.  Clinical Impression  Pt was seen for assessment of appropriateness of rollator walking device, and to get orthostatics checked.  Her sitting BP was 88/55, standing 93/60, and post gait was 73/50 (asymptomatic).  Reported to nursing and will continue to monitor it as her stay requires.  Follow for acute PT goals of mobility and focus on her control of balance, her endurance and changes in BP and pulses as well as sats, to avoid a greater fall risk.    Follow Up Recommendations Home health PT;Supervision for mobility/OOB    Equipment Recommendations  Other (comment) (4 wheeled walker)    Recommendations for Other Services       Precautions / Restrictions Precautions Precautions: Fall Precaution Comments: monitor orthostatics Restrictions Weight Bearing Restrictions: No      Mobility  Bed Mobility Overal bed mobility: Modified Independent             General bed mobility comments: Pt able to come to sitting with no difficulties or use of bed rails.    Transfers Overall transfer level: Modified independent Equipment used: None             General transfer comment: stands and then reaches for her rollator  Ambulation/Gait Ambulation/Gait assistance: Min guard (for safety) Gait Distance (Feet): 45 Feet Assistive device: 4-wheeled walker;1 person hand held assist Gait Pattern/deviations: Step-through pattern;Decreased stride length;Narrow base of support Gait velocity: reduced Gait velocity interpretation: <1.31 ft/sec, indicative of household ambulator General Gait Details: taking her time,  lines managed by PT  Stairs            Wheelchair Mobility    Modified Rankin (Stroke Patients Only)       Balance Overall balance assessment: Needs assistance Sitting-balance support: Feet supported Sitting balance-Leahy Scale: Fair     Standing balance support: Bilateral upper extremity supported;During functional activity Standing balance-Leahy Scale: Fair                               Pertinent Vitals/Pain Pain Assessment: No/denies pain    Home Living Family/patient expects to be discharged to:: Private residence Living Arrangements: Spouse/significant other Available Help at Discharge: Family;Available 24 hours/day Type of Home: House Home Access: Stairs to enter Entrance Stairs-Rails: None Entrance Stairs-Number of Steps: 2 steps Home Layout: Two level;Able to live on main level with bedroom/bathroom Home Equipment: [employer]andra Knudsen - single point;Shower seat;Transport chair Additional Comments: needs a rollator    Prior Function Level of Independence: Needs assistance   Gait / Transfers Assistance Needed: has relied on her furniture and a transport chair  ADL's / Homemaking Assistance Needed: husband can assist dressing and bathing        Hand Dominance   Dominant Hand: Right    Extremity/Trunk Assessment   Upper Extremity Assessment Upper Extremity Assessment: Generalized weakness    Lower Extremity Assessment Lower Extremity Assessment: Generalized weakness    Cervical / Trunk Assessment Cervical / Trunk Assessment: Kyphotic (mild)  Communication   Communication: No difficulties  Cognition Arousal/Alertness: Awake/alert Behavior During Therapy: WFL for tasks assessed/performed Overall Cognitive Status: Within Functional Limits for  tasks assessed                                        General Comments General comments (skin integrity, edema, etc.): O2 98% pre mobility and 93% post mobility with pulse 82     Exercises     Assessment/Plan    PT Assessment Patient needs continued PT services  PT Problem List Decreased strength;Decreased range of motion;Decreased activity tolerance;Decreased balance;Decreased mobility;Decreased coordination       PT Treatment Interventions DME instruction;Gait training;Stair training;Functional mobility training;Therapeutic activities;Therapeutic exercise;Balance training;Neuromuscular re-education;Patient/family education    PT Goals (Current goals can be found in the Care Plan section)  Acute Rehab PT Goals Patient Stated Goal: go home PT Goal Formulation: With patient/family Time For Goal Achievement: 04/02/21 Potential to Achieve Goals: Good    Frequency Min 3X/week   Barriers to discharge Inaccessible home environment has two steps to enter house    Co-evaluation               AM-PAC PT "6 Clicks" Mobility  Outcome Measure Help needed turning from your back to your side while in a flat bed without using bedrails?: None Help needed moving from lying on your back to sitting on the side of a flat bed without using bedrails?: A Little Help needed moving to and from a bed to a chair (including a wheelchair)?: A Little Help needed standing up from a chair using your arms (e.g., wheelchair or bedside chair)?: A Little Help needed to walk in hospital room?: A Little Help needed climbing 3-5 steps with a railing? : A Lot 6 Click Score: 18    End of Session Equipment Utilized During Treatment: Gait belt;Oxygen Activity Tolerance: Patient limited by fatigue Patient left: in chair;with call bell/phone within reach;with family/visitor present Nurse Communication: Mobility status PT Visit Diagnosis: Unsteadiness on feet (R26.81);Muscle weakness (generalized) (M62.81)    Time: [id number] PT Time Calculation (min) (ACUTE ONLY): 38 min   Charges:   PT Evaluation $PT Eval Moderate Complexity: 1 Mod PT Treatments $Gait Training: 8-22  mins $Therapeutic Exercise: 8-22 mins       Ramond Dial 03/30/21, 1:52 PM  Mee Hives, PT MS Acute Rehab Dept. Number: [place] and [place]

## 2021-04-03 NOTE — Anesthesia Procedure Notes (Signed)
Procedure Name: Intubation Date/Time: 03/29/2021 6:27 PM Performed by: Thelma Comp, CRNA Pre-anesthesia Checklist: Patient identified, Emergency Drugs available, Suction available and Patient being monitored Patient Re-evaluated:Patient Re-evaluated prior to induction Oxygen Delivery Method: Ambu bag Preoxygenation: Pre-oxygenation with 100% oxygen Induction Type: IV induction, Cricoid Pressure applied and Rapid sequence Ventilation: Mask ventilation without difficulty Laryngoscope Size: Glidescope and 4 Grade View: Grade I Tube type: Oral Tube size: 7.0 mm Number of attempts: 1 Airway Equipment and Method: Stylet Placement Confirmation: ETT inserted through vocal cords under direct vision, breath sounds checked- equal and bilateral and CO2 detector Secured at: 22 cm Tube secured with: Tape Dental Injury: Teeth and Oropharynx as per pre-operative assessment

## 2021-04-03 NOTE — Evaluation (Signed)
Occupational Therapy Evaluation Patient Details Name: Tiffany [name] MRN: [id number] DOB: 04-04-1946 Today's Date: 2021/04/08    History of Present Illness 75 y.o. F admitted to [facility] on 03/25/21 due to volume overload/pulmonary edema with complaints of neck pain. Prior medical history significant for Afib, pHTN, arthritis, CHF, CKD on dialysis 4x per week, Raynaud Disease, and GERD.   Clinical Impression   Pt admitted for concerns listed above. PTA pt reported requiring assistance for LB ADL's and using furniture and her husband to assist with ambulating. At this time, pt continues to demonstrate baseline level of assist for ADL's. Pt is able to complete LB dressing independently, however it makes her dizzy, so she prefers that her husband do it, and she feels safer when her husband assists with LB bathing as well. Pt ambulates with mild balance deficits, using no DME, however when using a RW, pt becomes much safer and stable. At this time, pt needs no further OT services and acute OT will sign off.     Follow Up Recommendations  No OT follow up;Supervision - Intermittent    Equipment Recommendations  Other (comment) (RW)    Recommendations for Other Services       Precautions / Restrictions Precautions Precautions: Fall Restrictions Weight Bearing Restrictions: No      Mobility Bed Mobility Overal bed mobility: Modified Independent             General bed mobility comments: Pt able to come to sitting with no difficulties or use of bed rails.    Transfers Overall transfer level: Modified independent Equipment used: None             General transfer comment: Pt able to come to standing from bed in lowest setting, and recliner, with no concerns    Balance Overall balance assessment: Mild deficits observed, not formally tested                                         ADL either performed or assessed with clinical judgement   ADL Overall ADL's : At  baseline                                       General ADL Comments: Pt demonstrates baseline level needs for LB bathing and dressing, transfers are safe with a RW and all seated ADL's, pt has no difficulties with.     Vision Baseline Vision/History: 1 Wears glasses Ability to See in Adequate Light: 0 Adequate Patient Visual Report: No change from baseline Vision Assessment?: No apparent visual deficits     Perception Perception Perception Tested?: No   Praxis Praxis Praxis tested?: Not tested    Pertinent Vitals/Pain Pain Assessment: No/denies pain     Hand Dominance Right   Extremity/Trunk Assessment Upper Extremity Assessment Upper Extremity Assessment: Generalized weakness   Lower Extremity Assessment Lower Extremity Assessment: Defer to PT evaluation   Cervical / Trunk Assessment Cervical / Trunk Assessment: Normal   Communication Communication Communication: No difficulties   Cognition Arousal/Alertness: Awake/alert Behavior During Therapy: WFL for tasks assessed/performed Overall Cognitive Status: Within Functional Limits for tasks assessed  General Comments  VSS on 6L, O2 93%, HR 85    Exercises     Shoulder Instructions      Home Living Family/patient expects to be discharged to:: Private residence Living Arrangements: Spouse/significant other Available Help at Discharge: Family;Available 24 hours/day Type of Home: House Home Access: Stairs to enter [employer] of Steps: 2 steps Entrance Stairs-Rails: None Home Layout: Two level;Able to live on main level with bedroom/bathroom     Bathroom Shower/Tub: Teacher, early years/pre: Standard Bathroom Accessibility: Yes How Accessible: Accessible via walker Home Equipment: [place] - single point;Shower seat;Transport chair          Prior Functioning/Environment Level of Independence: Needs assistance  Gait /  Transfers Assistance Needed: Pt reports furniture walking and using her husband to hold on to when needed, For long distances out of the house, pt uses a transport WC ADL's / Homemaking Assistance Needed: Pt reports needing assistance into and out of the tub, as well as assistance with LB bathing and dressing.            OT Problem List: Decreased strength;Decreased activity tolerance;Impaired balance (sitting and/or standing);Decreased safety awareness;Decreased knowledge of use of DME or AE      OT Treatment/Interventions:      OT Goals(Current goals can be found in the care plan section) Acute Rehab OT Goals Patient Stated Goal: To go homer OT Goal Formulation: All assessment and education complete, DC therapy Time For Goal Achievement: 2021-04-22 Potential to Achieve Goals: Good  OT Frequency:     Barriers to D/C:            Co-evaluation              AM-PAC OT "6 Clicks" Daily Activity     Outcome Measure Help from another person eating meals?: None Help from another person taking care of personal grooming?: None Help from another person toileting, which includes using toliet, bedpan, or urinal?: None Help from another person bathing (including washing, rinsing, drying)?: A Little Help from another person to put on and taking off regular upper body clothing?: None Help from another person to put on and taking off regular lower body clothing?: A Little 6 Click Score: 22   End of Session Equipment Utilized During Treatment: Gait belt;Rolling walker;Oxygen Nurse Communication: Mobility status  Activity Tolerance: Patient tolerated treatment well Patient left: in chair;with call bell/phone within reach;with family/visitor present  OT Visit Diagnosis: Unsteadiness on feet (R26.81);Other abnormalities of gait and mobility (R26.89);Muscle weakness (generalized) (M62.81)                Time: [id number] OT Time Calculation (min): 40 min Charges:  OT General Charges $OT  Visit: 1 Visit OT Evaluation $OT Eval Low Complexity: 1 Low OT Treatments $Self Care/Home Management : 23-37 mins  Lulla Linville H., OTR/L Acute Rehabilitation  Yazleemar Strassner Elane Micajah Dennin 22-Apr-2021, 11:46 AM

## 2021-04-03 NOTE — Progress Notes (Signed)
Called to bedside for cyanosis, hypoxia, hypotension after dialysis  Placed on NRB - vitals notable for systolic in 0000000, hypoxia with O2 sat fluctuating from 70s-90's (mostly 70's-80's).  HR wnl.    Seen at bedside, c/o feeling generally poorly  Bolus ordered.  Levophed via peripheral.   ABG showed respiratory alkalosis. Bipap for respiratory failure.    PCCM consulted with hypotension and respiratory failure.   She developed worsening respiratory distress and Tiffany [name] code was called.  She was bagged and intubated.  Subsequently required chest compressions.  See code documentation/note.

## 2021-04-03 NOTE — Progress Notes (Addendum)
Advanced Heart Failure Rounding Note  PCP-Cardiologist: Dr. [name]  Subjective:     2.5L removed with HD yesterday. Weight down 5 lb after HD. Dyspnea improving.  No weight this am. Is/Os not accurate.  SBP 90s.   Echo, 03/25/2021: EF 60-65%, interventricular septum flattened in diastole and systole consistent with RV volume overload, RV fxn moderately reduced, severely enlarged RV, RVSP estimated at 95 mmHg, severe TR, trivial MR, dilated IVC  Objective:   Weight Range: 54 kg Body mass index is 24.04 kg/m.   Vital Signs:   Temp:  [97.8 F (36.6 C)-99.4 F (37.4 C)] 98.2 F (36.8 C) (08/24 0411) Pulse Rate:  [66-101] 76 (08/24 0411) Resp:  [17-27] 19 (08/24 0411) BP: (85-109)/(43-66) 91/60 (08/24 0411) SpO2:  [89 %-91 %] 90 % (08/24 0411) Weight:  [54 kg-56.5 kg] 54 kg (08/23 1240)    Weight change: Filed Weights   03/21/2021 1506 03/25/21 0850 03/25/21 1240  Weight: 54 kg 56.5 kg 54 kg    Intake/Output:   Intake/Output Summary (Last 24 hours) at 2021/04/10 0706 Last data filed at 03/25/2021 1240 Gross per 24 hour  Intake --  Output 2500 ml  Net -2500 ml      Physical Exam    General:  Well appearing elderly female. NAD.  HEENT: HOH Neck: Supple.  JVP elevated. Carotids 2+ bilat; no bruits. No lymphadenopathy or thyromegaly appreciated. Cor: PMI nondisplaced. Regular rate & rhythm. No rubs, gallops or murmurs. Lungs: Diminished in bsaes otherwise clear Abdomen: Soft, nontender, nondistended. No hepatosplenomegaly. No bruits or masses. Good bowel sounds. Extremities: No cyanosis, clubbing, rash, trace bilateral LEE Neuro: Alert & orientedx3, cranial nerves grossly intact. moves all 4 extremities w/o difficulty. Affect pleasant   Telemetry   Sinus 70s-80s  EKG    08/23: Sinus with 1st degree AVB  Labs    CBC Recent Labs    03/08/2021 1602 03/25/21 0217 Apr 10, 2021 0135  WBC 4.5 5.2 4.7  NEUTROABS 3.9  --   --   HGB 8.3* 7.8* 7.9*  HCT 26.7*  24.9* 25.0*  MCV 92.7 92.6 92.6  PLT 167 170 0000000   Basic Metabolic Panel Recent Labs    03/11/2021 1602 03/25/21 0217 2021/04/10 0135  NA 133* 133* 133*  K 3.1* 3.5 3.6  CL 92* 94* 95*  CO2 '27 26 25  '$ GLUCOSE 95 101* 105*  BUN 45* 45* 31*  CREATININE 5.74* 6.27* 4.26*  CALCIUM 9.1 9.0 8.3*  MG 2.0  --   --   PHOS  --  5.0*  --    Liver Function Tests Recent Labs    03/25/21 0217  AST 13*  ALT 8  ALKPHOS 126  BILITOT 1.0  PROT 6.0*  ALBUMIN 2.8*   No results for input(s): LIPASE, AMYLASE in the last 72 hours. Cardiac Enzymes No results for input(s): CKTOTAL, CKMB, CKMBINDEX, TROPONINI in the last 72 hours.  BNP: BNP (last 3 results) Recent Labs    03/27/2021 1602  BNP 2,088.5*    ProBNP (last 3 results) No results for input(s): PROBNP in the last 8760 hours.   D-Dimer Recent Labs    03/23/2021 1602  DDIMER 0.93*   Hemoglobin A1C No results for input(s): HGBA1C in the last 72 hours. Fasting Lipid Panel No results for input(s): CHOL, HDL, LDLCALC, TRIG, CHOLHDL, LDLDIRECT in the last 72 hours. Thyroid Function Tests No results for input(s): TSH, T4TOTAL, T3FREE, THYROIDAB in the last 72 hours.  Invalid input(s): FREET3  Other results:  Imaging    CT CERVICAL SPINE WO CONTRAST  Result Date: 03/25/2021 CLINICAL DATA:  Neck pain. EXAM: CT CERVICAL SPINE WITHOUT CONTRAST TECHNIQUE: Multidetector CT imaging of the cervical spine was performed without intravenous contrast. Multiplanar CT image reconstructions were also generated. COMPARISON:  None. FINDINGS: Alignment: Minimal grade 1 anterolisthesis of C3-4 and C4-5 is noted secondary to posterior facet joint hypertrophy. Skull base and vertebrae: No acute fracture. No primary bone lesion or focal pathologic process. Soft tissues and spinal canal: No prevertebral fluid or swelling. No visible canal hematoma. Disc levels: Moderate degenerative disc disease is noted at C5-6 and C6-7. Upper chest: Negative. Other:  Degenerative changes are seen involving the right-sided posterior facet joints. IMPRESSION: Multilevel degenerative disc disease. No acute abnormality seen in the cervical spine. Electronically Signed   By: Marijo Conception M.D.   On: 03/25/2021 14:55   ECHOCARDIOGRAM COMPLETE BUBBLE STUDY  Result Date: 03/25/2021    ECHOCARDIOGRAM REPORT   Patient Name:   Tiffany [name] Date of Exam: 03/25/2021 Medical Rec #:  [id number]      Height:       59.0 in Accession #:    [id number]     Weight:       119.0 lb Date of Birth:  September 04, 1945      BSA:          1.479 m Patient Age:    75 years       BP:           85/54 mmHg Patient Gender: F              HR:           81 bpm. Exam Location:  Inpatient Procedure: 2D Echo Indications:    pulmonary artery hypertension  History:        Patient has prior history of Echocardiogram examinations, most                 recent 08/27/2020. End stage renal disease, Arrythmias:Atrial                 Fibrillation; Risk Factors:Hypertension.  Sonographer:    Johny Chess RDCS Referring Phys: [id number] [place]  1. Right ventricular systolic function is moderately reduced. The right ventricular size is severely enlarged. There is severely elevated pulmonary artery systolic pressure. The estimated right ventricular systolic pressure is 99991111 mmHg.  2. 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  3. Left ventricular ejection fraction, by estimation, is 60 to 65%. The left ventricle has normal function. The left ventricle has no regional wall motion abnormalities. There is mild asymmetric left ventricular hypertrophy of the septal segment. Left ventricular diastolic parameters are indeterminate. There is the interventricular septum is flattened in diastole ('D' shaped left ventricle), consistent with right ventricular volume overload and the interventricular septum is flattened in systole, consistent with right  ventricular pressure overload.  4. Left atrial size was mildly dilated.  5. Right atrial size was severely dilated.  6. The mitral valve is degenerative. Trivial mitral valve regurgitation. No evidence of mitral stenosis. Moderate mitral annular calcification.  7. The aortic valve is abnormal. There is moderate calcification of the aortic valve. Aortic valve regurgitation is not visualized. Mild aortic valve stenosis. Aortic valve area, by VTI measures 1.53 cm. Aortic valve mean gradient measures 12.0 mmHg. Aortic valve Vmax measures 2.38 m/s.  8. The inferior vena cava is dilated in size with <  50% respiratory variability, suggesting right atrial pressure of 15 mmHg.  9. Agitated saline contrast bubble study was negative, with no evidence of any interatrial shunt. FINDINGS  Left Ventricle: Left ventricular ejection fraction, by estimation, is 60 to 65%. The left ventricle has normal function. The left ventricle has no regional wall motion abnormalities. The left ventricular internal cavity size was normal in size. There is  mild asymmetric left ventricular hypertrophy of the septal segment. The interventricular septum is flattened in diastole ('D' shaped left ventricle), consistent with right ventricular volume overload and the interventricular septum is flattened in systole, consistent with right ventricular pressure overload. Left ventricular diastolic function could not be evaluated due to abnormal septal motion. Left ventricular diastolic parameters are indeterminate. Right Ventricle: The right ventricular size is severely enlarged. No increase in right ventricular wall thickness. Right ventricular systolic function is moderately reduced. There is severely elevated pulmonary artery systolic pressure. The tricuspid regurgitant velocity is 4.48 m/s, and with an assumed right atrial pressure of 15 mmHg, the estimated right ventricular systolic pressure is 99991111 mmHg. Left Atrium: Left atrial size was mildly  dilated. Right Atrium: Right atrial size was severely dilated. Pericardium: There is no evidence of pericardial effusion. Mitral Valve: The mitral valve is degenerative in appearance. Moderate mitral annular calcification. Trivial mitral valve regurgitation. No evidence of mitral valve stenosis. Tricuspid Valve: The tricuspid valve is normal in structure. 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 No evidence of tricuspid stenosis. Aortic Valve: The aortic valve is abnormal. There is moderate calcification of the aortic valve. Aortic valve regurgitation is not visualized. Mild aortic stenosis is present. Aortic valve mean gradient measures 12.0 mmHg. Aortic valve peak gradient measures 22.7 mmHg. Aortic valve area, by VTI measures 1.53 cm. Pulmonic Valve: The pulmonic valve was normal in structure. Pulmonic valve regurgitation is mild. No evidence of pulmonic stenosis. Aorta: The aortic root is normal in size and structure. Venous: The inferior vena cava is dilated in size with less than 50% respiratory variability, suggesting right atrial pressure of 15 mmHg. IAS/Shunts: No atrial level shunt detected by color flow Doppler. Agitated saline contrast was given intravenously to evaluate for intracardiac shunting. Agitated saline contrast bubble study was negative, with no evidence of any interatrial shunt.  LEFT VENTRICLE PLAX 2D LVIDd:         3.60 cm  Diastology LVIDs:         1.60 cm  LV e' medial:    7.72 cm/s LV PW:         0.80 cm  LV E/e' medial:  11.2 LV IVS:        1.20 cm  LV e' lateral:   13.30 cm/s LVOT diam:     1.70 cm  LV E/e' lateral: 6.5 LV SV:         61 LV SV Index:   41 LVOT Area:     2.27 cm  RIGHT VENTRICLE             IVC RV S prime:     12.20 cm/s  IVC diam: 2.40 cm TAPSE (M-mode): 2.2 cm LEFT ATRIUM             Index       RIGHT ATRIUM           Index LA diam:        3.20 cm 2.16 cm/m  RA Area:     32.60 cm LA Vol  (  A2C):   65.9 ml 44.55 ml/m RA Volume:   127.00 ml 85.86 ml/m LA Vol (A4C):   36.9 ml 24.95 ml/m LA Biplane Vol: 50.9 ml 34.41 ml/m  AORTIC VALVE AV Area (Vmax):    1.35 cm AV Area (Vmean):   1.42 cm AV Area (VTI):     1.53 cm AV Vmax:           238.00 cm/s AV Vmean:          155.000 cm/s AV VTI:            0.396 m AV Peak Grad:      22.7 mmHg AV Mean Grad:      12.0 mmHg LVOT Vmax:         142.00 cm/s LVOT Vmean:        96.700 cm/s LVOT VTI:          0.267 m LVOT/AV VTI ratio: 0.67  AORTA Ao Root diam: 2.80 cm Ao Asc diam:  3.10 cm MITRAL VALVE               TRICUSPID VALVE MV Area (PHT): 3.03 cm    TR Peak grad:   80.3 mmHg MV Decel Time: 250 msec    TR Vmax:        448.00 cm/s MV E velocity: 86.60 cm/s MV A velocity: 76.40 cm/s  SHUNTS MV E/A ratio:  1.13        Systemic VTI:  0.27 m                            Systemic Diam: 1.70 cm Cherlynn Kaiser MD Electronically signed by Cherlynn Kaiser MD Signature Date/Time: 03/25/2021/5:56:55 PM    Final      Medications:     Scheduled Medications:  apixaban  2.5 mg Oral BID   Chlorhexidine Gluconate Cloth  6 each Topical Q0600   darbepoetin (ARANESP) injection - DIALYSIS  60 mcg Intravenous Q Tue-HD   famotidine  20 mg Oral BID   feeding supplement (NEPRO CARB STEADY)  237 mL Oral BID BM   flecainide  50 mg Oral BID   macitentan  10 mg Oral Daily   metoprolol succinate  12.5 mg Oral QHS   midodrine  5 mg Oral 4 times per day   multivitamin  1 tablet Oral QHS   Riociguat  2.5 mg Oral BID   saccharomyces boulardii  250 mg Oral Q M,W,F   Selexipag  2,400 mcg Oral BID    Infusions:  [START ON 03/27/2021] ferric gluconate (FERRLECIT) IVPB      PRN Medications: acetaminophen, fluticasone, ondansetron **OR** ondansetron (ZOFRAN) IV    Patient Profile   1. Pulmonary hypertension:  - Patient has pulmonary arterial hypertension.  She appears to have co-existing diastolic LV dysfunction given elevated PCWP on prior RHC in 1/17.  PFTs did not  show significant obstruction, they only showed moderately decreased DLCO consistent with pulmonary vascular disease. V/Q scan did not show evidence for chronic PE.  PVR 7.6 WU by Fick and 8.4 WU by thermodiluation on 1/17 RHC.  Cardiac index was low, 1.97 Fick/1.8 thermo. 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  Given worsening symptoms, RHC was repeated in 11/18.  This showed PVR 3.7 WU with mean PA pressure 32 and normal CI.  [place]  done in 1/22 showed normal filling pressures and cardiac output but still with severe pulmonary hypertension.   Echo in 1/22 showed normal LV systolic function but moderately dilated and dysfunctional RV.   - Echo 08/23 with preserved LV function, interventricular septum flattened in diastole and systole consistent with RV volume overload, RV fxn moderately reduced, severely enlarged RV, RVSP estimated at 95 mmHg, severe TR, trivial MR, dilated IVC - She is on home oxygen w/ recent increased in demand, secondary to fluid overload/missed HD session. C/w HD for fluid removal  - Continue riociguat  2.5 mg tid on non HD days, bid on HD days - She has sleep apnea, now using CPAP.  - Continue Opsumit 10 mg daily  - Continue Uptravi 2400 mcg bid.  - SBP 90s. Will increase midodrine to 10 mg 4 X daily 2. 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 Missed recent HD session as noted above.  - Received HD yesterday with 2.5L fluid removal. Remains volume overload. Recommend HD again today - Orders placed for daily weights and Is/Os - c/w PH meds per above  3. A/C respiratory failure with hypoxia: - On 3L 02 at home. Requiring 5-6 L since admit in setting of volume overload - Hopefully will improve with  volume removal 3. ESRD - HD per nephrology  4. Atrial fibrillation:  - Paroxysmal. She had atrial fibrillation ablation in 6/18. Maintaining NSR w/ flecainide. ? Junctional rhythm on admit EKG. NSR w/ 1st degree AVB on ECG 08/23 - C/w Flecainide 50 mg bid - Metoprolol initially held on admit d/t low BP. Need AVN blocker in setting of Flecainide use. Restarted Toprol XL at 12.5 mg qhs. 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 Amil Amen.  6. Neck Pain/ DJD - conservative management per primary  7. Anemia:  -Hgb 7.9. ? Anemia of chronic disease from CKD - Prior baseline 10-11s - Iron studies pending - Receiving Ferrlecit per nephrology  Length of Stay: [place], LINDSAY N, PA-C  04/14/21, 7:06 AM  Advanced Heart Failure Team Pager [phone] (M-F; 7a - 5p)  Please contact [facility] for night-coverage after hours (5p -7a ) and weekends on [email or web address]   Patient seen with PA, agree with the above note.   Echo was done, shows EF 60-65% with D-shaped septum, RV severely dilated with moderate dysfunction, PASP 95, IVC dilated.    Weight down 5 lbs with HD.  She is still on more oxygen than baseline, 5L versus 3L. She is in NSR.   General: NAD Neck: JVP 14-16 cm, no thyromegaly or thyroid nodule.  Lungs: Clear to auscultation bilaterally with normal respiratory effort. CV: Nondisplaced PMI.  Heart regular S1/S2, no S3/S4, 3/6 HSM LLSB.  1+ edema to knees.  Abdomen: Soft, nontender, no hepatosplenomegaly, no distention.  Skin: Intact without lesions or rashes.  Neurologic: Alert and oriented x 3.  Psych: Normal affect. Extremities: No clubbing or cyanosis.  HEENT: Normal.   Symptomatically better after HD yesterday, but still volume overloaded beyond her baseline.  Would recommend HD here today, then if stable let her go home and resume her usual HD regimen which would be Thursday and Friday HD as well.   Long-term, with her RV failure, volume management is going to be very difficult.   See above for pulmonary hypertension  regimen.   Low dose Toprol XL resumed given flecainide use, and apixaban decreased to 2.5 mg bid.   Agree with increase in midodrine to 10 mg tid.   Loralie Champagne 04-25-2021 9:02 AM

## 2021-04-03 NOTE — Death Summary Note (Addendum)
DEATH SUMMARY   Patient Details  Name: Tiffany [name] MRN: [id number] DOB: 1945/09/15  Admission/Discharge Information   Admit Date:  2021/04/02  Date of Death: Date of Death: 04/04/21  Time of Death: Time of Death: 10-29-34  Length of Stay: 1  Referring Physician: [name], MD   Reason(s) for Hospitalization  Volume overload, pulmonary artery hypertension, acute on chronic respiratory failure, ESRD  Diagnoses  Preliminary cause of death: acute on chronic hypoxic respiratory failure Secondary Diagnoses (including complications and co-morbidities):  Principal Problem:   Volume overload Active Problems:   Atrial fibrillation status post cardioversion, 01/30/15 maintaining SR.    End stage renal disease ([place])   Primary pulmonary hypertension (H[facility])   Mediastinal lymphadenopathy   Acute respiratory failure [facility])   Brief Hospital Course (including significant findings, care, treatment, and services provided and events leading to death)  Tiffany [name] is Tiffany [name] 74 y.o. 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  She'd been recently started on dialysis about 2 months ago.  She presented with shortness of breath 2/2 volume overload.  She was dialyzed with some improvement in her symptoms.  Her midodrine was increased on 04/05/23 for borderline blood pressures.  Unfortunately, after dialysis on Apr 05, 2023 she became hypotensive, cyanotic, and hypoxic.  PCCM was consulted for her hypoxia and hypotension.  She was intubated for respiratory distress, but subsequently developed cardiac arrest.  ROSC was achieved after about 14 minutes of CPR, but she required CPR again after arrival to ICU with rosc after 5 minutes.  Due to her poor prognosis, PCCM discussed with family and Tiffany [name] decision was made to transition to  comfort measures.  See prior notes for additional details.  Pertinent Labs and Studies  Significant Diagnostic Studies CT Angio Chest PE W and/or Wo Contrast  Result Date: April 02, 2021 CLINICAL DATA:  Right-sided neck pain for 8 weeks EXAM: CT ANGIOGRAPHY CHEST WITH CONTRAST TECHNIQUE: Multidetector CT imaging of the chest was performed using the standard protocol during bolus administration of intravenous contrast. Multiplanar CT image reconstructions and MIPs were obtained to evaluate the vascular anatomy. CONTRAST:  76m OMNIPAQUE IOHEXOL 350 MG/ML SOLN COMPARISON:  Same day chest radiograph, CT chest 06/21/2017 FINDINGS: Cardiovascular: Satisfactory opacification of the pulmonary arteries to the segmental level. No evidence of pulmonary embolism. There is cardiomegaly with particular enlargement of the right heart. The main pulmonary artery is enlarged at 3.4 cm. There is reflux of contrast into the IVC and hepatic veins. Tiffany [name] right internal jugular dialysis catheter is in place terminating in the right atrium. There is calcified atherosclerotic plaque of the aortic arch. Mediastinum/Nodes: There are prominent mediastinal lymph nodes measuring up to 1.4 cm. There is right hilar lymphadenopathy with Tiffany [name] conglomerate measuring up to approximately 2.2 cm x 1.7 cm (4-100). There are enlarged left hilar lymph nodes measuring up to 1.2 cm. These lymph nodes are overall increased in size compared to the CT chest of 06/21/2017. Lungs/Pleura: The trachea and central airways are patent. There is Tiffany [name] background of moderate emphysema. There is masslike consolidation in the superior segment of the right lower lobe measuring 2.3 cm by 1.4 cm. There is Tiffany [name] calcified granuloma in the right middle lobe. There is Tiffany [name] trace right pleural effusion with adjacent relaxation atelectasis. Otherwise, there is no focal consolidation. There is no left pleural effusion. There is no  pneumothorax. Upper Abdomen: The imaged portions of the upper  abdominal viscera are unremarkable. Musculoskeletal: There is degenerative change in the lower cervical spine. There is no acute osseous abnormality. Tiffany [name] fatty lesion in the right breast is unchanged, consistent with Tiffany [name] lipoma. Review of the MIP images confirms the above findings. IMPRESSION: 1. No evidence of acute pulmonary embolus. 2. Cardiomegaly with evidence of right heart failure as above. Enlargement of the pulmonary artery also suggests pulmonary hypertension. 3. Bulky mediastinal and hilar lymphadenopathy and Tiffany [name] 2.3 cm x 1.4 cm may be due to heart failure. Recommend short follow up chest CT in 3 months to reassess. Masslike consolidation in the right lower lobe can also be reassessed at this time. 4. Trace right pleural effusion. 5. Aortic Atherosclerosis (ICD10-I70.0) and Emphysema (ICD10-J43.9). Electronically Signed   By: Valetta Mole M.D.   On: 04/01/2021 18:05   CT CERVICAL SPINE WO CONTRAST  Result Date: 03/25/2021 CLINICAL DATA:  Neck pain. EXAM: CT CERVICAL SPINE WITHOUT CONTRAST TECHNIQUE: Multidetector CT imaging of the cervical spine was performed without intravenous contrast. Multiplanar CT image reconstructions were also generated. COMPARISON:  None. FINDINGS: Alignment: Minimal grade 1 anterolisthesis of C3-4 and C4-5 is noted secondary to posterior facet joint hypertrophy. Skull base and vertebrae: No acute fracture. No primary bone lesion or focal pathologic process. Soft tissues and spinal canal: No prevertebral fluid or swelling. No visible canal hematoma. Disc levels: Moderate degenerative disc disease is noted at C5-6 and C6-7. Upper chest: Negative. Other: Degenerative changes are seen involving the right-sided posterior facet joints. IMPRESSION: Multilevel degenerative disc disease. No acute abnormality seen in the cervical spine. Electronically Signed   By: Marijo Conception M.D.   On: 03/25/2021 14:55   DG Chest Port 1 View  Result Date: 03/07/2021 CLINICAL DATA:  Shortness of  breath. EXAM: PORTABLE CHEST 1 VIEW COMPARISON:  09/01/2018 FINDINGS: Sheikh Leverich new right jugular catheter has been placed and terminates over the lower SVC. The cardiac silhouette is mildly enlarged. Aortic atherosclerosis is noted. There is central pulmonary vascular congestion. Widespread bilateral interstitial densities have mildly increased. Trace pleural effusions are questioned. No pneumothorax or acute osseous abnormality is identified. IMPRESSION: Mildly increased interstitial densities which may reflect mild edema. Electronically Signed   By: Logan Bores M.D.   On: 03/20/2021 16:47   VAS US DUPLEX DIALYSIS ACCESS (AVF, AVG)  Result Date: 03/04/2021 DIALYSIS ACCESS Patient Name:  SULEYMA MOSER  Date of Exam:   03/04/2021 Medical Rec #: [id number]       Accession #:    [id number] Date of Birth: 1946-01-31       Patient Gender: F Patient Age:   74Y Exam [facility] Procedure:      VAS US DUPLEX DIALYSIS ACCESS (AVF, AVG) Referring Phys: [id number] [name] --------------------------------------------------------------------------------  Reason for Exam: Routine follow up. Access Site: Left Upper Extremity. Access Type: Basilic vein transposition. History: Second stage on 02/14/21. Performing Technologist: Ralene Cork RVT  Examination Guidelines: Johnryan Sao complete evaluation includes B-mode imaging, spectral Doppler, color Doppler, and power Doppler as needed of all accessible portions of each vessel. Unilateral testing is considered an integral part of Prarthana Parlin complete examination. Limited examinations for reoccurring indications may be performed as noted.  Findings: +--------------------+----------+-----------------+--------+ AVF                 PSV (cm/s)Flow Vol (mL/min)Comments +--------------------+----------+-----------------+--------+ Native artery inflow   111           589                +--------------------+----------+-----------------+--------+  AVF Anastomosis         490                              +--------------------+----------+-----------------+--------+  +------------+----------+-------------+----------+--------+ OUTFLOW VEINPSV (cm/s)Diameter (cm)Depth (cm)Describe +------------+----------+-------------+----------+--------+ Prox UA        321        0.37        0.70            +------------+----------+-------------+----------+--------+ Mid UA         199        0.51        0.36            +------------+----------+-------------+----------+--------+ Dist UA        118        0.64        0.38            +------------+----------+-------------+----------+--------+ AC Fossa       307        0.49        0.68            +------------+----------+-------------+----------+--------+   Summary: Patent BVT. Retained valves in the distal upper arm and mid upper arm with no hemodynamically significant stenosis. No significant branches seen. Interstitial fluid in the upper arm.  *See table(s) above for measurements and observations.  Diagnosing physician: Monica Martinez MD Electronically signed by Monica Martinez MD on 03/04/2021 at 9:28:28 AM.   --------------------------------------------------------------------------------   Final    ECHOCARDIOGRAM COMPLETE BUBBLE STUDY  Result Date: 03/25/2021    ECHOCARDIOGRAM REPORT   Patient Name:   DEVANSHI CRUSE Date of Exam: 03/25/2021 Medical Rec #:  [id number]      Height:       59.0 in Accession #:    [id number]     Weight:       119.0 lb Date of Birth:  Feb 09, 1946      BSA:          1.479 m Patient Age:    4 years       BP:           85/54 mmHg Patient Gender: F              HR:           81 bpm. Exam Location:  Inpatient Procedure: 2D Echo Indications:    pulmonary artery hypertension  History:        Patient has prior history of Echocardiogram examinations, most                 recent 08/27/2020. End stage renal disease, Arrythmias:Atrial                 Fibrillation; Risk Factors:Hypertension.   Sonographer:    Johny Chess RDCS Referring Phys: [id number] [facility] [place]  1. Right ventricular systolic function is moderately reduced. The right ventricular size is severely enlarged. There is severely elevated pulmonary artery systolic pressure. The estimated right ventricular systolic pressure is 99991111 mmHg.  2. 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  3. Left ventricular ejection fraction, by estimation, is 60 to 65%. The left ventricle has normal function. The left ventricle has no regional wall motion abnormalities. There is mild asymmetric left ventricular hypertrophy of the septal segment. Left ventricular diastolic parameters are indeterminate. There is the interventricular septum is flattened in diastole ('D' shaped  left ventricle), consistent with right ventricular volume overload and the interventricular septum is flattened in systole, consistent with right ventricular pressure overload.  4. Left atrial size was mildly dilated.  5. Right atrial size was severely dilated.  6. The mitral valve is degenerative. Trivial mitral valve regurgitation. No evidence of mitral stenosis. Moderate mitral annular calcification.  7. The aortic valve is abnormal. There is moderate calcification of the aortic valve. Aortic valve regurgitation is not visualized. Mild aortic valve stenosis. Aortic valve area, by VTI measures 1.53 cm. Aortic valve mean gradient measures 12.0 mmHg. Aortic valve Vmax measures 2.38 m/s.  8. The inferior vena cava is dilated in size with <50% respiratory variability, suggesting right atrial pressure of 15 mmHg.  9. Agitated saline contrast bubble study was negative, with no evidence of any interatrial shunt. FINDINGS  Left Ventricle: Left ventricular ejection fraction, by estimation, is 60 to 65%. The left ventricle has normal function. The left ventricle has no regional wall motion abnormalities. The left  ventricular internal cavity size was normal in size. There is  mild asymmetric left ventricular hypertrophy of the septal segment. The interventricular septum is flattened in diastole ('D' shaped left ventricle), consistent with right ventricular volume overload and the interventricular septum is flattened in systole, consistent with right ventricular pressure overload. Left ventricular diastolic function could not be evaluated due to abnormal septal motion. Left ventricular diastolic parameters are indeterminate. Right Ventricle: The right ventricular size is severely enlarged. No increase in right ventricular wall thickness. Right ventricular systolic function is moderately reduced. There is severely elevated pulmonary artery systolic pressure. The tricuspid regurgitant velocity is 4.48 m/s, and with an assumed right atrial pressure of 15 mmHg, the estimated right ventricular systolic pressure is 99991111 mmHg. Left Atrium: Left atrial size was mildly dilated. Right Atrium: Right atrial size was severely dilated. Pericardium: There is no evidence of pericardial effusion. Mitral Valve: The mitral valve is degenerative in appearance. Moderate mitral annular calcification. Trivial mitral valve regurgitation. No evidence of mitral valve stenosis. Tricuspid Valve: The tricuspid valve is normal in structure. 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 No evidence of tricuspid stenosis. Aortic Valve: The aortic valve is abnormal. There is moderate calcification of the aortic valve. Aortic valve regurgitation is not visualized. Mild aortic stenosis is present. Aortic valve mean gradient measures 12.0 mmHg. Aortic valve peak gradient measures 22.7 mmHg. Aortic valve area, by VTI measures 1.53 cm. Pulmonic Valve: The pulmonic valve was normal in structure. Pulmonic valve regurgitation is mild. No evidence of pulmonic stenosis. Aorta: The aortic root is  normal in size and structure. Venous: The inferior vena cava is dilated in size with less than 50% respiratory variability, suggesting right atrial pressure of 15 mmHg. IAS/Shunts: No atrial level shunt detected by color flow Doppler. Agitated saline contrast was given intravenously to evaluate for intracardiac shunting. Agitated saline contrast bubble study was negative, with no evidence of any interatrial shunt.  LEFT VENTRICLE PLAX 2D LVIDd:         3.60 cm  Diastology LVIDs:         1.60 cm  LV e' medial:    7.72 cm/s LV PW:         0.80 cm  LV E/e' medial:  11.2 LV IVS:        1.20 cm  LV e' lateral:   13.30 cm/s LVOT diam:     1.70 cm  LV E/e' lateral: 6.5 LV  SV:         61 LV SV Index:   41 LVOT Area:     2.27 cm  RIGHT VENTRICLE             IVC RV S prime:     12.20 cm/s  IVC diam: 2.40 cm TAPSE (M-mode): 2.2 cm LEFT ATRIUM             Index       RIGHT ATRIUM           Index LA diam:        3.20 cm 2.16 cm/m  RA Area:     32.60 cm LA Vol (A2C):   65.9 ml 44.55 ml/m RA Volume:   127.00 ml 85.86 ml/m LA Vol (A4C):   36.9 ml 24.95 ml/m LA Biplane Vol: 50.9 ml 34.41 ml/m  AORTIC VALVE AV Area (Vmax):    1.35 cm AV Area (Vmean):   1.42 cm AV Area (VTI):     1.53 cm AV Vmax:           238.00 cm/s AV Vmean:          155.000 cm/s AV VTI:            0.396 m AV Peak Grad:      22.7 mmHg AV Mean Grad:      12.0 mmHg LVOT Vmax:         142.00 cm/s LVOT Vmean:        96.700 cm/s LVOT VTI:          0.267 m LVOT/AV VTI ratio: 0.67  AORTA Ao Root diam: 2.80 cm Ao Asc diam:  3.10 cm MITRAL VALVE               TRICUSPID VALVE MV Area (PHT): 3.03 cm    TR Peak grad:   80.3 mmHg MV Decel Time: 250 msec    TR Vmax:        448.00 cm/s MV E velocity: 86.60 cm/s MV Margel Joens velocity: 76.40 cm/s  SHUNTS MV E/Jonel Weldon ratio:  1.13        Systemic VTI:  0.27 m                            Systemic Diam: 1.70 cm Cherlynn Kaiser MD Electronically signed by Cherlynn Kaiser MD Signature Date/Time: 03/25/2021/5:56:55 PM    Final      Microbiology Recent Results (from the past 240 hour(s))  Resp Panel by RT-PCR (Flu Faith Patricelli&B, Covid) Nasopharyngeal Swab     Status: None   Collection Time: 03/28/2021  4:03 PM   Specimen: Nasopharyngeal Swab; Nasopharyngeal(NP) swabs in vial transport medium  Result Value Ref Range Status   SARS Coronavirus 2 by RT PCR NEGATIVE NEGATIVE Final    Comment: (NOTE) SARS-CoV-2 target nucleic acids are NOT DETECTED.  The SARS-CoV-2 RNA is generally detectable in upper respiratory specimens during the acute phase of infection. The lowest concentration of SARS-CoV-2 viral copies this assay can detect is 138 copies/mL. Jennavieve Arrick negative result does not preclude SARS-Cov-2 infection and should not be used as the sole basis for treatment or other patient management decisions. Donnis Pecha negative result may occur with  improper specimen collection/handling, submission of specimen other than nasopharyngeal swab, presence of viral mutation(s) within the areas targeted by this assay, and inadequate number of viral copies(<138 copies/mL). Jazaria Jarecki negative result must be combined with clinical observations, patient history, and epidemiological information. The  expected result is Negative.  Fact Sheet for Patients:  EntrepreneurPulse.com.au  Fact Sheet for Healthcare Providers:  IncredibleEmployment.be  This test is no t yet approved or cleared by the [place] FDA and  has been authorized for detection and/or diagnosis of SARS-CoV-2 by FDA under an Emergency Use Authorization (EUA). This EUA will remain  in effect (meaning this test can be used) for the duration of the COVID-19 declaration under Section 564(b)(1) of the Act, 21 U.S.C.section 360bbb-3(b)(1), unless the authorization is terminated  or revoked sooner.       Influenza Keylen Uzelac by PCR NEGATIVE NEGATIVE Final   Influenza B by PCR NEGATIVE NEGATIVE Final    Comment: (NOTE) The Xpert Xpress SARS-CoV-2/FLU/RSV plus assay is  intended as an aid in the diagnosis of influenza from Nasopharyngeal swab specimens and should not be used as Jochebed Bills sole basis for treatment. Nasal washings and aspirates are unacceptable for Xpert Xpress SARS-CoV-2/FLU/RSV testing.  Fact Sheet for Patients: EntrepreneurPulse.com.au  Fact Sheet for Healthcare Providers: IncredibleEmployment.be  This test is not yet approved or cleared by the [place] FDA and has been authorized for detection and/or diagnosis of SARS-CoV-2 by FDA under an Emergency Use Authorization (EUA). This EUA will remain in effect (meaning this test can be used) for the duration of the COVID-19 declaration under Section 564(b)(1) of the Act, 21 U.S.C. section 360bbb-3(b)(1), unless the authorization is terminated or revoked.  Performed at [facility], [address], [place], [place] [address]     Lab Basic Metabolic Panel: Recent Labs  Lab 03/05/2021 1602 03/25/21 0217 2021-04-02 0135  NA 133* 133* 133*  K 3.1* 3.5 3.6  CL 92* 94* 95*  CO2 '27 26 25  '$ GLUCOSE 95 101* 105*  BUN 45* 45* 31*  CREATININE 5.74* 6.27* 4.26*  CALCIUM 9.1 9.0 8.3*  MG 2.0  --   --   PHOS  --  5.0*  --    Liver Function Tests: Recent Labs  Lab 03/25/21 0217  AST 13*  ALT 8  ALKPHOS 126  BILITOT 1.0  PROT 6.0*  ALBUMIN 2.8*   No results for input(s): LIPASE, AMYLASE in the last 168 hours. No results for input(s): AMMONIA in the last 168 hours. CBC: Recent Labs  Lab 03/23/2021 1602 03/25/21 0217 04-02-21 0135  WBC 4.5 5.2 4.7  NEUTROABS 3.9  --   --   HGB 8.3* 7.8* 7.9*  HCT 26.7* 24.9* 25.0*  MCV 92.7 92.6 92.6  PLT 167 170 160   Cardiac Enzymes: No results for input(s): CKTOTAL, CKMB, CKMBINDEX, TROPONINI in the last 168 hours. Sepsis Labs: Recent Labs  Lab 04/01/2021 1602 03/25/21 0217 02-Apr-2021 0135  WBC 4.5 5.2 4.7    Procedures/Operations  See prior notes   Fayrene Helper 03/27/2021,  7:03 PM

## 2021-04-03 NOTE — Progress Notes (Addendum)
PROGRESS NOTE    Tiffany [name]  [id number] DOB: Jun 25, 1946 DOA: 03/21/2021 PCP: Tiffany Koch, MD   Chief Complaint  Patient presents with   Neck Pain   Brief Narrative:  75 yo with hx severe pulmonary artery hypertension, ESRD on MTTF dialysis, PAF on eliquis and multiple other medical problems who presented to the ED with shortness of breath.  Reportedly on dialysis for the past 8 weeks or so.  Missed dialysis on the day of admission related to severe neck pain (she relates to needing to sit in the dialysis chair for more than 3-4 hrs).  Assessment & Plan:   Principal Problem:   Volume overload Active Problems:   Atrial fibrillation status post cardioversion, 01/30/15 maintaining SR.    End stage renal disease (Tiffany [name])   Primary pulmonary hypertension (HCC)   Mediastinal lymphadenopathy   Acute respiratory failure (HCC)  Volume Overload  End Stage Renal Disease  Pulmonary Artery Hypertension  Acute on Chronic Hypoxic Respiratory Failure On 3 L at home, worse at presentation Currently on 6 L - wean as tolerated Dialysis again today ESRD 2/2 cardiorenal syndrome Dialysis/volume per renal  Cardiology c/s for severe PAH -> opsumit, uptravi, riociguat - midodrine increased for hypotension Echo with RVSF moderately reduced, RV size severely enlarged, severely elevated PASP, severe/torrential TV regurgitation.  EF 60-65%, no RWMA.  D shaped L ventricle.  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 2/2 HF -> needs short follow up chest CT in 3 months (masslike consolidation in RLL can be reassessed at that time as well)  Anemia Normocytic, AOCD/renal disease Normal B12, folate, iron ->  ferritin elevated Smear review  Significantly Elevated Ferritin Previously <8 in January.  Stop iron supplementation.  Consider discussion with heme -> noted likely inflammatory, no recommendation for additional specific w/u at this time.  DVT prophylaxis: eliquis Code Status: full  Family Communication: husband at bedside Disposition:   Status is: Inpatient  Remains inpatient appropriate because:Inpatient level of care appropriate due to severity of illness  Dispo: The patient is from: Home              Anticipated d/c is to:  pending              Patient currently is not medically stable to d/c.   Difficult to place patient No       Consultants:  Cardiology nephrology  Procedures:  Echo IMPRESSIONS     1. Right ventricular systolic function is moderately reduced. The right  ventricular size is severely enlarged. There is severely elevated  pulmonary artery systolic pressure. The estimated right ventricular  systolic pressure is 99991111 mmHg.   2. Tricuspid valve regurgitation is severe/torrential due to incomplete  coaptation of TV leaflets from annular dilation.   3. Left ventricular ejection fraction, by estimation, is 60 to 65%. The  left ventricle has normal function. The left ventricle has no regional  wall motion abnormalities. There is mild asymmetric left ventricular  hypertrophy of the septal segment. Left  ventricular diastolic parameters are indeterminate. There is the  interventricular septum is flattened in diastole ('D' shaped left  ventricle), consistent with right ventricular volume overload and the  interventricular septum is flattened in systole,  consistent with right ventricular pressure overload.   4. Left  atrial size was mildly dilated.   5. Right atrial size was severely dilated.   6. The mitral valve is degenerative. Trivial mitral valve regurgitation.  No evidence of mitral stenosis. Moderate mitral annular calcification.   7. The aortic  valve is abnormal. There is moderate calcification of the  aortic valve. Aortic valve regurgitation is not visualized. Mild aortic  valve stenosis. Aortic valve area, by VTI measures 1.53 cm. Aortic valve  mean gradient measures 12.0 mmHg.  Aortic valve Vmax measures 2.38 m/s.   8. The inferior vena cava is dilated in size with <50% respiratory  variability, suggesting right atrial pressure of 15 mmHg.   9. Agitated saline contrast bubble study was negative, with no evidence  of any interatrial shunt.   Antimicrobials:  Anti-infectives (From admission, onward)    None          Subjective: Doesn't feel quite back to herself No LH, dizziness Feels better overall, but not to baseline  Objective: Vitals:   03/25/21 2026 04/05/2021 0038 2021-04-05 0411 05-Apr-2021 1249  BP: (!) 98/57 (!) 91/49 91/60 (!) 86/61  Pulse: 76 87 76 74  Resp: 19 (!) '27 19 14  '$ Temp: 99.4 F (37.4 C)  98.2 F (36.8 C) 98.1 F (36.7 C)  TempSrc: Oral  Oral Oral  SpO2: 91% 91% 90%   Weight:      Height:       No intake or output data in the 24 hours ending 04/05/2021 1534 Filed Weights   03/10/2021 1506 03/25/21 0850 03/25/21 1240  Weight: 54 kg 56.5 kg 54 kg    Examination:  General exam: Appears calm and comfortable - sitting up eating lunch, pleasant Respiratory system: unlabored, with [place] in place Cardiovascular system: RRR Central nervous system: Alert and oriented. No focal neurological deficits. Extremities: no LEE Skin: No rashes, lesions or ulcers Psychiatry: Judgement and insight appear normal. Mood & affect appropriate.     Data Reviewed: I have personally reviewed following labs and imaging studies  CBC: Recent Labs  Lab 03/03/2021 1602 03/25/21 0217 2021-04-05 0135  WBC 4.5 5.2 4.7  NEUTROABS 3.9  --   --   HGB 8.3* 7.8* 7.9*  HCT 26.7* 24.9* 25.0*  MCV 92.7 92.6 92.6  PLT 167 170 0000000    Basic Metabolic Panel: Recent Labs  Lab 03/31/2021 1602 03/25/21 0217 2021/04/05 0135  NA  133* 133* 133*  K 3.1* 3.5 3.6  CL 92* 94* 95*  CO2 '27 26 25  '$ GLUCOSE 95 101* 105*  BUN 45* 45* 31*  CREATININE 5.74* 6.27* 4.26*  CALCIUM 9.1 9.0 8.3*  MG 2.0  --   --   PHOS  --  5.0*  --     GFR: Estimated Creatinine Clearance: 8.4 mL/min (Tiffany [name]) (by C-G formula based on SCr of 4.26 mg/dL (H)).  Liver Function Tests: Recent Labs  Lab 03/25/21 0217  AST 13*  ALT 8  ALKPHOS 126  BILITOT 1.0  PROT 6.0*  ALBUMIN 2.8*    CBG: No results for input(s): GLUCAP in the last 168 hours.   Recent Results (from the past 240 hour(s))  Resp Panel by RT-PCR (Flu Mayce Noyes&B, Covid) Nasopharyngeal Swab     Status: None   Collection Time: 03/12/2021  4:03 PM   Specimen: Nasopharyngeal Swab; Nasopharyngeal(NP) swabs in vial transport medium  Result Value Ref Range Status   SARS Coronavirus 2 by RT PCR NEGATIVE NEGATIVE Final    Comment: (NOTE) SARS-CoV-2 target nucleic acids are NOT DETECTED.  The SARS-CoV-2 RNA is generally detectable in upper respiratory specimens during the acute phase of infection. The lowest concentration of SARS-CoV-2 viral copies this assay can detect is 138 copies/mL. Iline Buchinger negative result does not preclude SARS-Cov-2 infection and should not be used as the sole basis for treatment or other patient management decisions. Fanta Wimberley negative result may occur with  improper specimen collection/handling, submission of specimen other than nasopharyngeal swab, presence of viral mutation(s) within the areas targeted by this assay, and inadequate number of viral copies(<138 copies/mL). Chayah Mckee negative result must be combined with clinical observations, patient history, and epidemiological information. The expected result is Negative.  Fact Sheet for Patients:  EntrepreneurPulse.com.au  Fact Sheet for Healthcare Providers:  IncredibleEmployment.be  This test is no t yet approved or cleared by the [place] FDA and  has been authorized for detection  and/or diagnosis of SARS-CoV-2 by FDA under an Emergency Use Authorization (EUA). This EUA will remain  in effect (meaning this test can be used) for the duration of the COVID-19 declaration under Section 564(b)(1) of the Act, 21 U.S.C.section 360bbb-3(b)(1), unless the authorization is terminated  or revoked sooner.       Influenza Alonnah Lampkins by PCR NEGATIVE NEGATIVE Final   Influenza B by PCR NEGATIVE NEGATIVE Final    Comment: (NOTE) The Xpert Xpress SARS-CoV-2/FLU/RSV plus assay is intended as an aid in the diagnosis of influenza from Nasopharyngeal swab specimens and should not be used as Rajiv Parlato sole basis for treatment. Nasal washings and aspirates are unacceptable for Xpert Xpress SARS-CoV-2/FLU/RSV testing.  Fact Sheet for Patients: EntrepreneurPulse.com.au  Fact Sheet for Healthcare Providers: IncredibleEmployment.be  This test is not yet approved or cleared by the [place] FDA and has been authorized for detection and/or diagnosis of SARS-CoV-2 by FDA under an Emergency Use Authorization (EUA). This EUA will remain in effect (meaning this test can be used) for the duration of the COVID-19 declaration under Section 564(b)(1) of the Act, 21 U.S.C. section 360bbb-3(b)(1), unless the authorization is terminated or revoked.  Performed at [facility], [address], [place], [place] [address]          Radiology Studies: CT Angio Chest PE W and/or Wo Contrast  Result Date: 04/02/2021 CLINICAL DATA:  Right-sided neck pain for 8 weeks EXAM: CT ANGIOGRAPHY CHEST WITH CONTRAST TECHNIQUE: Multidetector CT imaging of the chest was performed using the standard protocol during bolus administration of intravenous contrast. Multiplanar CT image reconstructions and MIPs were obtained to evaluate the vascular anatomy. CONTRAST:  20m OMNIPAQUE IOHEXOL 350 MG/ML SOLN COMPARISON:  Same day chest radiograph, CT chest 06/21/2017 FINDINGS:  Cardiovascular: Satisfactory opacification of the pulmonary arteries to the segmental level. No evidence of pulmonary embolism. There is cardiomegaly with particular enlargement of the right heart. The main pulmonary artery is enlarged at 3.4 cm. There is reflux of contrast into the IVC and hepatic veins. Sandhya Denherder right internal jugular dialysis catheter is in place terminating in the right atrium. There is calcified atherosclerotic plaque of the aortic arch. Mediastinum/Nodes: There are prominent mediastinal lymph nodes measuring up to 1.4 cm. There is right hilar lymphadenopathy with Demisha Nokes conglomerate measuring up to approximately 2.2 cm x 1.7 cm (4-100). There are enlarged left hilar lymph nodes measuring up to 1.2 cm. These lymph nodes are overall increased in size compared to the CT chest of 06/21/2017. Lungs/Pleura: The trachea and central airways are patent. There is Darvis Croft background of moderate emphysema. There is masslike consolidation in the superior segment of the right  lower lobe measuring 2.3 cm by 1.4 cm. There is Kaitrin Seybold calcified granuloma in the right middle lobe. There is Drezden Seitzinger trace right pleural effusion with adjacent relaxation atelectasis. Otherwise, there is no focal consolidation. There is no left pleural effusion. There is no pneumothorax. Upper Abdomen: The imaged portions of the upper abdominal viscera are unremarkable. Musculoskeletal: There is degenerative change in the lower cervical spine. There is no acute osseous abnormality. Rashad Obeid fatty lesion in the right breast is unchanged, consistent with Verdia Bolt lipoma. Review of the MIP images confirms the above findings. IMPRESSION: 1. No evidence of acute pulmonary embolus. 2. Cardiomegaly with evidence of right heart failure as above. Enlargement of the pulmonary artery also suggests pulmonary hypertension. 3. Bulky mediastinal and hilar lymphadenopathy and Rakwon Letourneau 2.3 cm x 1.4 cm may be due to heart failure. Recommend short follow up chest CT in 3 months to reassess. Masslike  consolidation in the right lower lobe can also be reassessed at this time. 4. Trace right pleural effusion. 5. Aortic Atherosclerosis (ICD10-I70.0) and Emphysema (ICD10-J43.9). Electronically Signed   By: Valetta Mole M.D.   On: 03/23/2021 18:05   CT CERVICAL SPINE WO CONTRAST  Result Date: 03/25/2021 CLINICAL DATA:  Neck pain. EXAM: CT CERVICAL SPINE WITHOUT CONTRAST TECHNIQUE: Multidetector CT imaging of the cervical spine was performed without intravenous contrast. Multiplanar CT image reconstructions were also generated. COMPARISON:  None. FINDINGS: Alignment: Minimal grade 1 anterolisthesis of C3-4 and C4-5 is noted secondary to posterior facet joint hypertrophy. Skull base and vertebrae: No acute fracture. No primary bone lesion or focal pathologic process. Soft tissues and spinal canal: No prevertebral fluid or swelling. No visible canal hematoma. Disc levels: Moderate degenerative disc disease is noted at C5-6 and C6-7. Upper chest: Negative. Other: Degenerative changes are seen involving the right-sided posterior facet joints. IMPRESSION: Multilevel degenerative disc disease. No acute abnormality seen in the cervical spine. Electronically Signed   By: Marijo Conception M.D.   On: 03/25/2021 14:55   DG Chest Port 1 View  Result Date: 03/30/2021 CLINICAL DATA:  Shortness of breath. EXAM: PORTABLE CHEST 1 VIEW COMPARISON:  09/01/2018 FINDINGS: Windle Huebert new right jugular catheter has been placed and terminates over the lower SVC. The cardiac silhouette is mildly enlarged. Aortic atherosclerosis is noted. There is central pulmonary vascular congestion. Widespread bilateral interstitial densities have mildly increased. Trace pleural effusions are questioned. No pneumothorax or acute osseous abnormality is identified. IMPRESSION: Mildly increased interstitial densities which may reflect mild edema. Electronically Signed   By: Logan Bores M.D.   On: 03/18/2021 16:47   ECHOCARDIOGRAM COMPLETE BUBBLE  STUDY  Result Date: 03/25/2021    ECHOCARDIOGRAM REPORT   Patient Name:   SAMANTHANICOLE IBAY Date of Exam: 03/25/2021 Medical Rec #:  [id number]      Height:       59.0 in Accession #:    [id number]     Weight:       119.0 lb Date of Birth:  22-Jul-1946      BSA:          1.479 m Patient Age:    1 years       BP:           85/54 mmHg Patient Gender: F              HR:           81 bpm. Exam Location:  Inpatient Procedure: 2D Echo Indications:    pulmonary artery hypertension  History:  Patient has prior history of Echocardiogram examinations, most                 recent 08/27/2020. End stage renal disease, Arrythmias:Atrial                 Fibrillation; Risk Factors:Hypertension.  Sonographer:    Johny Chess RDCS Referring Phys: [id number] [place]  1. Right ventricular systolic function is moderately reduced. The right ventricular size is severely enlarged. There is severely elevated pulmonary artery systolic pressure. The estimated right ventricular systolic pressure is 99991111 mmHg.  2. 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  3. Left ventricular ejection fraction, by estimation, is 60 to 65%. The left ventricle has normal function. The left ventricle has no regional wall motion abnormalities. There is mild asymmetric left ventricular hypertrophy of the septal segment. Left ventricular diastolic parameters are indeterminate. There is the interventricular septum is flattened in diastole ('D' shaped left ventricle), consistent with right ventricular volume overload and the interventricular septum is flattened in systole, consistent with right ventricular pressure overload.  4. Left atrial size was mildly dilated.  5. Right atrial size was severely dilated.  6. The mitral valve is degenerative. Trivial mitral valve regurgitation. No evidence of mitral stenosis. Moderate mitral annular calcification.  7. The aortic valve is  abnormal. There is moderate calcification of the aortic valve. Aortic valve regurgitation is not visualized. Mild aortic valve stenosis. Aortic valve area, by VTI measures 1.53 cm. Aortic valve mean gradient measures 12.0 mmHg. Aortic valve Vmax measures 2.38 m/s.  8. The inferior vena cava is dilated in size with <50% respiratory variability, suggesting right atrial pressure of 15 mmHg.  9. Agitated saline contrast bubble study was negative, with no evidence of any interatrial shunt. FINDINGS  Left Ventricle: Left ventricular ejection fraction, by estimation, is 60 to 65%. The left ventricle has normal function. The left ventricle has no regional wall motion abnormalities. The left ventricular internal cavity size was normal in size. There is  mild asymmetric left ventricular hypertrophy of the septal segment. The interventricular septum is flattened in diastole ('D' shaped left ventricle), consistent with right ventricular volume overload and the interventricular septum is flattened in systole, consistent with right ventricular pressure overload. Left ventricular diastolic function could not be evaluated due to abnormal septal motion. Left ventricular diastolic parameters are indeterminate. Right Ventricle: The right ventricular size is severely enlarged. No increase in right ventricular wall thickness. Right ventricular systolic function is moderately reduced. There is severely elevated pulmonary artery systolic pressure. The tricuspid regurgitant velocity is 4.48 m/s, and with an assumed right atrial pressure of 15 mmHg, the estimated right ventricular systolic pressure is 99991111 mmHg. Left Atrium: Left atrial size was mildly dilated. Right Atrium: Right atrial size was severely dilated. Pericardium: There is no evidence of pericardial effusion. Mitral Valve: The mitral valve is degenerative in appearance. Moderate mitral annular calcification. Trivial mitral valve regurgitation. No evidence of mitral valve  stenosis. Tricuspid Valve: The tricuspid valve is normal in structure. 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 No evidence of tricuspid stenosis. Aortic Valve: The aortic valve is abnormal. There is moderate calcification of the aortic valve. Aortic valve regurgitation is not visualized. Mild aortic stenosis is present. Aortic valve mean gradient measures 12.0 mmHg. Aortic valve peak gradient measures 22.7 mmHg. Aortic valve area, by VTI measures 1.53 cm. Pulmonic Valve: The pulmonic valve was  normal in structure. Pulmonic valve regurgitation is mild. No evidence of pulmonic stenosis. Aorta: The aortic root is normal in size and structure. Venous: The inferior vena cava is dilated in size with less than 50% respiratory variability, suggesting right atrial pressure of 15 mmHg. IAS/Shunts: No atrial level shunt detected by color flow Doppler. Agitated saline contrast was given intravenously to evaluate for intracardiac shunting. Agitated saline contrast bubble study was negative, with no evidence of any interatrial shunt.  LEFT VENTRICLE PLAX 2D LVIDd:         3.60 cm  Diastology LVIDs:         1.60 cm  LV e' medial:    7.72 cm/s LV PW:         0.80 cm  LV E/e' medial:  11.2 LV IVS:        1.20 cm  LV e' lateral:   13.30 cm/s LVOT diam:     1.70 cm  LV E/e' lateral: 6.5 LV SV:         61 LV SV Index:   41 LVOT Area:     2.27 cm  RIGHT VENTRICLE             IVC RV S prime:     12.20 cm/s  IVC diam: 2.40 cm TAPSE (M-mode): 2.2 cm LEFT ATRIUM             Index       RIGHT ATRIUM           Index LA diam:        3.20 cm 2.16 cm/m  RA Area:     32.60 cm LA Vol (A2C):   65.9 ml 44.55 ml/m RA Volume:   127.00 ml 85.86 ml/m LA Vol (A4C):   36.9 ml 24.95 ml/m LA Biplane Vol: 50.9 ml 34.41 ml/m  AORTIC VALVE AV Area (Vmax):    1.35 cm AV Area (Vmean):   1.42 cm AV Area (VTI):     1.53 cm AV Vmax:           238.00 cm/s AV Vmean:           155.000 cm/s AV VTI:            0.396 m AV Peak Grad:      22.7 mmHg AV Mean Grad:      12.0 mmHg LVOT Vmax:         142.00 cm/s LVOT Vmean:        96.700 cm/s LVOT VTI:          0.267 m LVOT/AV VTI ratio: 0.67  AORTA Ao Root diam: 2.80 cm Ao Asc diam:  3.10 cm MITRAL VALVE               TRICUSPID VALVE MV Area (PHT): 3.03 cm    TR Peak grad:   80.3 mmHg MV Decel Time: 250 msec    TR Vmax:        448.00 cm/s MV E velocity: 86.60 cm/s MV Marybeth Dandy velocity: 76.40 cm/s  SHUNTS MV E/Lashanti Chambless ratio:  1.13        Systemic VTI:  0.27 m                            Systemic Diam: 1.70 cm Cherlynn Kaiser MD Electronically signed by Cherlynn Kaiser MD Signature Date/Time: 03/25/2021/5:56:55 PM    Final         Scheduled Meds:  apixaban  2.5 mg Oral BID   Chlorhexidine Gluconate Cloth  6 each Topical Q0600   darbepoetin (ARANESP) injection - DIALYSIS  60 mcg Intravenous Q Tue-HD   famotidine  20 mg Oral BID   feeding supplement (NEPRO CARB STEADY)  237 mL Oral BID BM   flecainide  50 mg Oral BID   macitentan  10 mg Oral Daily   metoprolol succinate  12.5 mg Oral QHS   midodrine  10 mg Oral 4 times per day   multivitamin  1 tablet Oral QHS   Riociguat  2.5 mg Oral BID   saccharomyces boulardii  250 mg Oral Q M,W,F   Selexipag  2,400 mcg Oral BID   Continuous Infusions:  sodium chloride     sodium chloride     [START ON 03/27/2021] ferric gluconate (FERRLECIT) IVPB       LOS: 1 day    Time spent: over 30 min    Fayrene Helper, MD [facility]   To contact the attending provider between 7A-7P or the covering provider during after hours 7P-7A, please log into the web site [email or web address] and access using universal [place] password for that web site. If you do not have the password, please call the hospital operator.  April 10, 2021, 3:34 PM

## 2021-04-03 DEATH — deceased

## 2021-04-09 ENCOUNTER — Encounter (HOSPITAL_COMMUNITY): Payer: Medicare PPO | Admitting: Cardiology

## 2021-05-05 ENCOUNTER — Other Ambulatory Visit (HOSPITAL_COMMUNITY): Payer: Self-pay | Admitting: Family Medicine

## 2021-05-29 ENCOUNTER — Other Ambulatory Visit: Payer: Self-pay | Admitting: Cardiovascular Disease

## 2021-06-20 ENCOUNTER — Ambulatory Visit: Payer: Medicare PPO | Admitting: Internal Medicine
